# Patient Record
Sex: Female | Born: 1950 | Race: Black or African American | Hispanic: No | Marital: Married | Smoking: Former smoker
Health system: Southern US, Community
[De-identification: ages and names within clinical notes are randomized; demographics above are authoritative.]

## PROBLEM LIST (undated history)

## (undated) DIAGNOSIS — Z9109 Other allergy status, other than to drugs and biological substances: Secondary | ICD-10-CM

## (undated) DIAGNOSIS — G4730 Sleep apnea, unspecified: Secondary | ICD-10-CM

## (undated) DIAGNOSIS — M359 Systemic involvement of connective tissue, unspecified: Secondary | ICD-10-CM

## (undated) DIAGNOSIS — R0600 Dyspnea, unspecified: Secondary | ICD-10-CM

## (undated) DIAGNOSIS — I10 Essential (primary) hypertension: Secondary | ICD-10-CM

## (undated) DIAGNOSIS — E039 Hypothyroidism, unspecified: Secondary | ICD-10-CM

## (undated) DIAGNOSIS — E7800 Pure hypercholesterolemia, unspecified: Secondary | ICD-10-CM

## (undated) DIAGNOSIS — M069 Rheumatoid arthritis, unspecified: Secondary | ICD-10-CM

## (undated) DIAGNOSIS — C801 Malignant (primary) neoplasm, unspecified: Secondary | ICD-10-CM

## (undated) DIAGNOSIS — J45909 Unspecified asthma, uncomplicated: Secondary | ICD-10-CM

## (undated) DIAGNOSIS — E079 Disorder of thyroid, unspecified: Secondary | ICD-10-CM

## (undated) DIAGNOSIS — E119 Type 2 diabetes mellitus without complications: Secondary | ICD-10-CM

## (undated) DIAGNOSIS — G629 Polyneuropathy, unspecified: Secondary | ICD-10-CM

## (undated) DIAGNOSIS — M797 Fibromyalgia: Secondary | ICD-10-CM

## (undated) HISTORY — DX: Pure hypercholesterolemia, unspecified: E78.00

## (undated) HISTORY — PX: OTHER SURGICAL HISTORY: SHX169

## (undated) HISTORY — PX: DIAGNOSTIC LAPAROSCOPY: SUR761

## (undated) HISTORY — DX: Fibromyalgia: M79.7

## (undated) HISTORY — PX: CYSTOURETHROSCOPY: SHX476

## (undated) HISTORY — PX: ABDOMINAL HYSTERECTOMY: SHX81

## (undated) HISTORY — PX: CHOLECYSTECTOMY: SHX55

## (undated) HISTORY — PX: THYROIDECTOMY, PARTIAL: SHX18

## (undated) HISTORY — DX: Other allergy status, other than to drugs and biological substances: Z91.09

## (undated) HISTORY — DX: Disorder of thyroid, unspecified: E07.9

## (undated) HISTORY — PX: EYE SURGERY: SHX253

## (undated) HISTORY — PX: CATARACT EXTRACTION: SUR2

---

## 2016-03-31 ENCOUNTER — Emergency Department: Payer: Self-pay

## 2016-03-31 ENCOUNTER — Emergency Department
Admission: EM | Admit: 2016-03-31 | Discharge: 2016-04-01 | Disposition: A | Discharge status: Home / Self Care | Payer: Self-pay | Attending: Emergency Medicine | Admitting: Emergency Medicine

## 2016-03-31 ENCOUNTER — Encounter: Payer: Self-pay | Admitting: Emergency Medicine

## 2016-03-31 DIAGNOSIS — J4541 Moderate persistent asthma with (acute) exacerbation: Secondary | ICD-10-CM

## 2016-03-31 DIAGNOSIS — I10 Essential (primary) hypertension: Secondary | ICD-10-CM | POA: Insufficient documentation

## 2016-03-31 DIAGNOSIS — J4521 Mild intermittent asthma with (acute) exacerbation: Secondary | ICD-10-CM | POA: Insufficient documentation

## 2016-03-31 DIAGNOSIS — B3731 Acute candidiasis of vulva and vagina: Secondary | ICD-10-CM

## 2016-03-31 DIAGNOSIS — M069 Rheumatoid arthritis, unspecified: Secondary | ICD-10-CM | POA: Insufficient documentation

## 2016-03-31 DIAGNOSIS — B373 Candidiasis of vulva and vagina: Secondary | ICD-10-CM | POA: Insufficient documentation

## 2016-03-31 DIAGNOSIS — B9689 Other specified bacterial agents as the cause of diseases classified elsewhere: Secondary | ICD-10-CM

## 2016-03-31 DIAGNOSIS — Z79899 Other long term (current) drug therapy: Secondary | ICD-10-CM | POA: Insufficient documentation

## 2016-03-31 DIAGNOSIS — N760 Acute vaginitis: Secondary | ICD-10-CM | POA: Insufficient documentation

## 2016-03-31 HISTORY — DX: Rheumatoid arthritis, unspecified: M06.9

## 2016-03-31 HISTORY — DX: Unspecified asthma, uncomplicated: J45.909

## 2016-03-31 HISTORY — DX: Essential (primary) hypertension: I10

## 2016-03-31 MED ORDER — IPRATROPIUM-ALBUTEROL 0.5-2.5 (3) MG/3ML IN SOLN
RESPIRATORY_TRACT | Status: AC
  Administered 2016-03-31: 6 mL
  Filled 2016-03-31: qty 6
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
## 2016-03-31 NOTE — ED Notes (Signed)
Pt reports asthma exacerbation with congestion and wheezing.  Pt has multiple concerns (BV odor, rash and "full blown menopausal" and multiple stressors) but pt states if she can get some prednisone for asthma then she'd be okay.  Pt reports green and brown production with strong cough.  Reports nebulizer treatment approx 1 hour ago.

## 2016-04-01 LAB — WET PREP, GENITAL
Sperm: NONE SEEN
Trich, Wet Prep: NONE SEEN

## 2016-04-01 MED ORDER — PREDNISONE 10 MG PO TABS: ORAL_TABLET | ORAL | Status: DC

## 2016-04-01 MED ORDER — FLUCONAZOLE 150 MG PO TABS: 150.0000 mg | ORAL_TABLET | Freq: Once | ORAL | Status: DC

## 2016-04-01 MED ORDER — METRONIDAZOLE 500 MG PO TABS: 500.0000 mg | ORAL_TABLET | Freq: Two times a day (BID) | ORAL | Status: AC

## 2016-04-01 MED ORDER — AZITHROMYCIN 250 MG PO TABS: ORAL_TABLET | ORAL | Status: DC

## 2016-04-01 NOTE — Discharge Instructions (Signed)
Asthma, Adult °Asthma is a recurring condition in which the airways tighten and narrow. Asthma can make it difficult to breathe. It can cause coughing, wheezing, and shortness of breath. Asthma episodes, also called asthma attacks, range from minor to life-threatening. Asthma cannot be cured, but medicines and lifestyle changes can help control it. °CAUSES °Asthma is believed to be caused by inherited (genetic) and environmental factors, but its exact cause is unknown. Asthma may be triggered by allergens, lung infections, or irritants in the air. Asthma triggers are different for each person. Common triggers include:  °· Animal dander. °· Dust mites. °· Cockroaches. °· Pollen from trees or grass. °· Mold. °· Smoke. °· Air pollutants such as dust, household cleaners, hair sprays, aerosol sprays, paint fumes, strong chemicals, or strong odors. °· Cold air, weather changes, and winds (which increase molds and pollens in the air). °· Strong emotional expressions such as crying or laughing hard. °· Stress. °· Certain medicines (such as aspirin) or types of drugs (such as beta-blockers). °· Sulfites in foods and drinks. Foods and drinks that may contain sulfites include dried fruit, potato chips, and sparkling grape juice. °· Infections or inflammatory conditions such as the flu, a cold, or an inflammation of the nasal membranes (rhinitis). °· Gastroesophageal reflux disease (GERD). °· Exercise or strenuous activity. °SYMPTOMS °Symptoms may occur immediately after asthma is triggered or many hours later. Symptoms include: °· Wheezing. °· Excessive nighttime or early morning coughing. °· Frequent or severe coughing with a common cold. °· Chest tightness. °· Shortness of breath. °DIAGNOSIS  °The diagnosis of asthma is made by a review of your medical history and a physical exam. Tests may also be performed. These may include: °· Lung function studies. These tests show how much air you breathe in and out. °· Allergy  tests. °· Imaging tests such as X-rays. °TREATMENT  °Asthma cannot be cured, but it can usually be controlled. Treatment involves identifying and avoiding your asthma triggers. It also involves medicines. There are 2 classes of medicine used for asthma treatment:  °· Controller medicines. These prevent asthma symptoms from occurring. They are usually taken every day. °· Reliever or rescue medicines. These quickly relieve asthma symptoms. They are used as needed and provide short-term relief. °Your health care provider will help you create an asthma action plan. An asthma action plan is a written plan for managing and treating your asthma attacks. It includes a list of your asthma triggers and how they may be avoided. It also includes information on when medicines should be taken and when their dosage should be changed. An action plan may also involve the use of a device called a peak flow meter. A peak flow meter measures how well the lungs are working. It helps you monitor your condition. °HOME CARE INSTRUCTIONS  °· Take medicines only as directed by your health care provider. Speak with your health care provider if you have questions about how or when to take the medicines. °· Use a peak flow meter as directed by your health care provider. Record and keep track of readings. °· Understand and use the action plan to help minimize or stop an asthma attack without needing to seek medical care. °· Control your home environment in the following ways to help prevent asthma attacks: °· Do not smoke. Avoid being exposed to secondhand smoke. °· Change your heating and air conditioning filter regularly. °· Limit your use of fireplaces and wood stoves. °· Get rid of pests (such as roaches   and mice) and their droppings.  Throw away plants if you see mold on them.  Clean your floors and dust regularly. Use unscented cleaning products.  Try to have someone else vacuum for you regularly. Stay out of rooms while they are  being vacuumed and for a short while afterward. If you vacuum, use a dust mask from a hardware store, a double-layered or microfilter vacuum cleaner bag, or a vacuum cleaner with a HEPA filter.  Replace carpet with wood, tile, or vinyl flooring. Carpet can trap dander and dust.  Use allergy-proof pillows, mattress covers, and box spring covers.  Wash bed sheets and blankets every week in hot water and dry them in a dryer.  Use blankets that are made of polyester or cotton.  Clean bathrooms and kitchens with bleach. If possible, have someone repaint the walls in these rooms with mold-resistant paint. Keep out of the rooms that are being cleaned and painted.  Wash hands frequently. SEEK MEDICAL CARE IF:   You have wheezing, shortness of breath, or a cough even if taking medicine to prevent attacks.  The colored mucus you cough up (sputum) is thicker than usual.  Your sputum changes from clear or white to yellow, green, gray, or bloody.  You have any problems that may be related to the medicines you are taking (such as a rash, itching, swelling, or trouble breathing).  You are using a reliever medicine more than 2-3 times per week.  Your peak flow is still at 50-79% of your personal best after following your action plan for 1 hour.  You have a fever. SEEK IMMEDIATE MEDICAL CARE IF:   You seem to be getting worse and are unresponsive to treatment during an asthma attack.  You are short of breath even at rest.  You get short of breath when doing very little physical activity.  You have difficulty eating, drinking, or talking due to asthma symptoms.  You develop chest pain.  You develop a fast heartbeat.  You have a bluish color to your lips or fingernails.  You are light-headed, dizzy, or faint.  Your peak flow is less than 50% of your personal best.   This information is not intended to replace advice given to you by your health care provider. Make sure you discuss any  questions you have with your health care provider.   Document Released: 12/15/2005 Document Revised: 09/05/2015 Document Reviewed: 07/14/2013 Elsevier Interactive Patient Education 2016 Else[employer] Inc[employer]  Bacterial Vaginosis Bacterial vaginosis is an infection of the vagina. It happens when too many germs (bacteria) grow in the vagina. Having this infection puts you at risk for getting other infections from sex. Treating this infection can help lower your risk for other infections, such as:   Chlamydia.  Gonorrhea.  HIV.  Herpes. HOME CARE  Take your medicine as told by your doctor.  Finish your medicine even if you start to feel better.  Tell your sex partner that you have an infection. They should see their doctor for treatment.  During treatment:  Avoid sex or use condoms correctly.  Do not douche.  Do not drink alcohol unless your doctor tells you it is ok.  Do not breastfeed unless your doctor tells you it is ok. GET HELP IF:  You are not getting better after 3 days of treatment.  You have more grey fluid (discharge) coming from your vagina than before.  You have more pain than before.  You have a fever. MAKE SURE YOU:  Understand these instructions.  Will watch your condition.  Will get help right away if you are not doing well or get worse.   This information is not intended to replace advice given to you by your health care provider. Make sure you discuss any questions you have with your health care provider.   Document Released: 09/23/2008 Document Revised: 01/05/2015 Document Reviewed: 07/27/2013 Elsevier Interactive Patient Education 2016 Elsevier Inc.  Probiotics WHAT ARE PROBIOTICS? Probiotics are the good bacteria and yeasts that live in your body and keep you and your digestive system healthy. Probiotics also help your body's defense (immune) system and protect your body against bad bacterial growth.  Certain foods contain probiotics, such as  yogurt. Probiotics can also be purchased as a supplement. As with any supplement or drug, it is important to discuss its use with your health care provider.  WHAT AFFECTS THE BALANCE OF BACTERIA IN MY BODY? The balance of bacteria in your body can be affected by:   Antibiotic medicines. Antibiotics are sometimes necessary to treat infection. Unfortunately, they may kill good or friendly bacteria in your body as well as the bad bacteria. This may lead to stomach problems like diarrhea, gas, and cramping.  Disease. Some conditions are the result of an overgrowth of bad bacteria, yeasts, parasites, or fungi. These conditions include:   Infectious diarrhea.  Stomach and respiratory infections.  Skin infections.  Irritable bowel syndrome (IBS).  Inflammatory bowel diseases.  Ulcer due to Helicobacter pylori (H. pylori) infection.  Tooth decay and periodontal disease.  Vaginal infections. Stress and poor diet may also lower the good bacteria in your body.  WHAT TYPE OF PROBIOTIC IS RIGHT FOR ME? Probiotics are available over the counter at your local pharmacy, health food, or grocery store. They come in many different forms, combinations of strains, and dosing strengths. Some may need to be refrigerated. Always read the label for storage and usage instructions. Specific strains have been shown to be more effective for certain conditions. Ask your health care provider what option is best for you.  WHY WOULD I NEED PROBIOTICS? There are many reasons your health care provider might recommend a probiotic supplement, including:   Diarrhea.  Constipation.  IBS.  Respiratory infections.  Yeast infections.  Acne, eczema, and other skin conditions.  Frequent urinary tract infections (UTIs). ARE THERE SIDE EFFECTS OF PROBIOTICS? Some people experience mild side effects when taking probiotics. Side effects are usually temporary and may include:   Gas.  Bloating.  Cramping. Rarely,  serious side effects, such as infection or immune system changes, may occur. WHAT ELSE DO I NEED TO KNOW ABOUT PROBIOTICS?   There are many different strains of probiotics. Certain strains may be more effective depending on your condition. Probiotics are available in varying doses. Ask your health care provider which probiotic you should use and how often.   If you are taking probiotics along with antibiotics, it is generally recommended to wait at least 2 hours between taking the antibiotic and taking the probiotic.  FOR MORE INFORMATION:  [facility] for Complementary and Alternative Medicine [email or web address]   This information is not intended to replace advice given to you by your health care provider. Make sure you discuss any questions you have with your health care provider.   Document Released: 07/12/2014 Document Reviewed: 07/12/2014 Elsevier Interactive Patient Education 2016 Elsevier Inc.  Monilial Vaginitis Vaginitis in a soreness, swelling and redness (inflammation) of the vagina and vulva. Monilial vaginitis is not a sexually  transmitted infection. CAUSES  Yeast vaginitis is caused by yeast (candida) that is normally found in your vagina. With a yeast infection, the candida has overgrown in number to a point that upsets the chemical balance. SYMPTOMS   White, thick vaginal discharge.  Swelling, itching, redness and irritation of the vagina and possibly the lips of the vagina (vulva).  Burning or painful urination.  Painful intercourse. DIAGNOSIS  Things that may contribute to monilial vaginitis are:  Postmenopausal and virginal states.  Pregnancy.  Infections.  Being tired, sick or stressed, especially if you had monilial vaginitis in the past.  Diabetes. Good control will help lower the chance.  Birth control pills.  Tight fitting garments.  Using bubble bath, feminine sprays, douches or deodorant tampons.  Taking certain medications that kill  germs (antibiotics).  Sporadic recurrence can occur if you become ill. TREATMENT  Your caregiver will give you medication.  There are several kinds of anti monilial vaginal creams and suppositories specific for monilial vaginitis. For recurrent yeast infections, use a suppository or cream in the vagina 2 times a week, or as directed.  Anti-monilial or steroid cream for the itching or irritation of the vulva may also be used. Get your caregiver's permission.  Painting the vagina with methylene blue solution may help if the monilial cream does not work.  Eating yogurt may help prevent monilial vaginitis. HOME CARE INSTRUCTIONS   Finish all medication as prescribed.  Do not have sex until treatment is completed or after your caregiver tells you it is okay.  Take warm sitz baths.  Do not douche.  Do not use tampons, especially scented ones.  Wear cotton underwear.  Avoid tight pants and panty hose.  Tell your sexual partner that you have a yeast infection. They should go to their caregiver if they have symptoms such as mild rash or itching.  Your sexual partner should be treated as well if your infection is difficult to eliminate.  Practice safer sex. Use condoms.  Some vaginal medications cause latex condoms to fail. Vaginal medications that harm condoms are:  Cleocin cream.  Butoconazole (Femstat).  Terconazole (Terazol) vaginal suppository.  Miconazole (Monistat) (may be purchased over the counter). SEEK MEDICAL CARE IF:   You have a temperature by mouth above 102 F (38.9 C).  The infection is getting worse after 2 days of treatment.  The infection is not getting better after 3 days of treatment.  You develop blisters in or around your vagina.  You develop vaginal bleeding, and it is not your menstrual period.  You have pain when you urinate.  You develop intestinal problems.  You have pain with sexual intercourse.   This information is not intended  to replace advice given to you by your health care provider. Make sure you discuss any questions you have with your health care provider.   Document Released: 09/24/2005 Document Revised: 03/08/2012 Document Reviewed: 06/18/2015 Elsevier Interactive Patient Education 2016 [place] the prescription meds as directed. Continue your home meds as prescribed. Follow-up with your provider for ongoing management and treatment. Return to the ED as needed for worsening symptoms.

## 2016-08-22 ENCOUNTER — Emergency Department
Admission: EM | Admit: 2016-08-22 | Discharge: 2016-08-22 | Disposition: A | Discharge status: Home / Self Care | Payer: Non-veteran care | Attending: Emergency Medicine | Admitting: Emergency Medicine

## 2016-08-22 ENCOUNTER — Emergency Department: Payer: Non-veteran care

## 2016-08-22 ENCOUNTER — Inpatient Hospital Stay
Admission: EM | Admit: 2016-08-22 | Discharge: 2016-08-23 | DRG: 202 | Disposition: A | Discharge status: Home / Self Care | Payer: Non-veteran care | Attending: Internal Medicine | Admitting: Internal Medicine

## 2016-08-22 ENCOUNTER — Encounter: Payer: Self-pay | Admitting: Internal Medicine

## 2016-08-22 ENCOUNTER — Encounter: Payer: Self-pay | Admitting: Emergency Medicine

## 2016-08-22 DIAGNOSIS — J45901 Unspecified asthma with (acute) exacerbation: Secondary | ICD-10-CM | POA: Insufficient documentation

## 2016-08-22 DIAGNOSIS — R0602 Shortness of breath: Secondary | ICD-10-CM | POA: Diagnosis not present

## 2016-08-22 DIAGNOSIS — Z9849 Cataract extraction status, unspecified eye: Secondary | ICD-10-CM

## 2016-08-22 DIAGNOSIS — Z833 Family history of diabetes mellitus: Secondary | ICD-10-CM | POA: Diagnosis not present

## 2016-08-22 DIAGNOSIS — Z880 Allergy status to penicillin: Secondary | ICD-10-CM | POA: Diagnosis not present

## 2016-08-22 DIAGNOSIS — M069 Rheumatoid arthritis, unspecified: Secondary | ICD-10-CM | POA: Diagnosis present

## 2016-08-22 DIAGNOSIS — Z9049 Acquired absence of other specified parts of digestive tract: Secondary | ICD-10-CM | POA: Diagnosis not present

## 2016-08-22 DIAGNOSIS — Z8249 Family history of ischemic heart disease and other diseases of the circulatory system: Secondary | ICD-10-CM | POA: Diagnosis not present

## 2016-08-22 DIAGNOSIS — Z886 Allergy status to analgesic agent status: Secondary | ICD-10-CM | POA: Diagnosis not present

## 2016-08-22 DIAGNOSIS — Z79899 Other long term (current) drug therapy: Secondary | ICD-10-CM | POA: Diagnosis not present

## 2016-08-22 DIAGNOSIS — E119 Type 2 diabetes mellitus without complications: Secondary | ICD-10-CM | POA: Diagnosis present

## 2016-08-22 DIAGNOSIS — J209 Acute bronchitis, unspecified: Principal | ICD-10-CM | POA: Diagnosis present

## 2016-08-22 DIAGNOSIS — I10 Essential (primary) hypertension: Secondary | ICD-10-CM | POA: Diagnosis present

## 2016-08-22 DIAGNOSIS — Z7952 Long term (current) use of systemic steroids: Secondary | ICD-10-CM | POA: Diagnosis not present

## 2016-08-22 HISTORY — DX: Unspecified asthma with (acute) exacerbation: J45.901

## 2016-08-22 LAB — CBC WITH DIFFERENTIAL/PLATELET
Basophils Absolute: 0 10*3/uL (ref 0–0.1)
Basophils Relative: 1 %
Eosinophils Absolute: 1.1 10*3/uL — ABNORMAL HIGH (ref 0–0.7)
Eosinophils Relative: 12 %
HCT: 43.6 % (ref 35.0–47.0)
Hemoglobin: 14.4 g/dL (ref 12.0–16.0)
Lymphocytes Relative: 44 %
Lymphs Abs: 4.1 10*3/uL — ABNORMAL HIGH (ref 1.0–3.6)
MCH: 26.9 pg (ref 26.0–34.0)
MCHC: 32.9 g/dL (ref 32.0–36.0)
MCV: 81.9 fL (ref 80.0–100.0)
Monocytes Absolute: 1 10*3/uL — ABNORMAL HIGH (ref 0.2–0.9)
Monocytes Relative: 11 %
Neutro Abs: 3 10*3/uL (ref 1.4–6.5)
Neutrophils Relative %: 32 %
Platelets: 203 10*3/uL (ref 150–440)
RBC: 5.33 MIL/uL — ABNORMAL HIGH (ref 3.80–5.20)
RDW: 14.2 % (ref 11.5–14.5)
WBC: 9.2 10*3/uL (ref 3.6–11.0)

## 2016-08-22 LAB — BASIC METABOLIC PANEL
Anion gap: 12 (ref 5–15)
BUN: 13 mg/dL (ref 6–20)
CO2: 24 mmol/L (ref 22–32)
Calcium: 9.7 mg/dL (ref 8.9–10.3)
Chloride: 107 mmol/L (ref 101–111)
Creatinine, Ser: 0.85 mg/dL (ref 0.44–1.00)
GFR calc Af Amer: >60 mL/min (ref >60)
GFR calc non Af Amer: >60 mL/min (ref >60)
Glucose, Bld: 128 mg/dL — ABNORMAL HIGH (ref 65–99)
Potassium: 3.5 mmol/L (ref 3.5–5.1)
Sodium: 143 mmol/L (ref 135–145)

## 2016-08-22 LAB — TROPONIN I: Troponin I: <0.03 ng/mL (ref <0.03)

## 2016-08-22 MED ORDER — METHYLPREDNISOLONE SODIUM SUCC 125 MG IJ SOLR
60.0000 mg | Freq: Four times a day (QID) | INTRAMUSCULAR | Status: DC
  Administered 2016-08-22 – 2016-08-23 (×4): 60 mg via INTRAVENOUS
  Filled 2016-08-22 (×4): qty 2

## 2016-08-22 MED ORDER — IPRATROPIUM-ALBUTEROL 0.5-2.5 (3) MG/3ML IN SOLN
3.0000 mL | Freq: Once | RESPIRATORY_TRACT | Status: AC
  Administered 2016-08-22: 3 mL via RESPIRATORY_TRACT
  Filled 2016-08-22: qty 3

## 2016-08-22 MED ORDER — ALBUTEROL SULFATE (2.5 MG/3ML) 0.083% IN NEBU
5.0000 mg | INHALATION_SOLUTION | Freq: Once | RESPIRATORY_TRACT | Status: AC
  Administered 2016-08-22: 5 mg via RESPIRATORY_TRACT

## 2016-08-22 MED ORDER — METHYLPREDNISOLONE SODIUM SUCC 125 MG IJ SOLR
INTRAMUSCULAR | Status: AC
  Administered 2016-08-22: 125 mg via INTRAVENOUS
  Filled 2016-08-22: qty 2

## 2016-08-22 MED ORDER — ALBUTEROL SULFATE (2.5 MG/3ML) 0.083% IN NEBU
INHALATION_SOLUTION | RESPIRATORY_TRACT | Status: AC
  Administered 2016-08-22: 2.5 mg via RESPIRATORY_TRACT
  Filled 2016-08-22: qty 3

## 2016-08-22 MED ORDER — ALBUTEROL SULFATE (2.5 MG/3ML) 0.083% IN NEBU
2.5000 mg | INHALATION_SOLUTION | Freq: Once | RESPIRATORY_TRACT | Status: AC
  Administered 2016-08-22: 2.5 mg via RESPIRATORY_TRACT

## 2016-08-22 MED ORDER — ONDANSETRON HCL 4 MG/2ML IJ SOLN: 4.0000 mg | Freq: Four times a day (QID) | INTRAMUSCULAR | Status: DC | PRN

## 2016-08-22 MED ORDER — GUAIFENESIN-DM 100-10 MG/5ML PO SYRP
5.0000 mL | ORAL_SOLUTION | ORAL | Status: DC | PRN
  Administered 2016-08-22 – 2016-08-23 (×3): 5 mL via ORAL
  Filled 2016-08-22 (×3): qty 5

## 2016-08-22 MED ORDER — AZITHROMYCIN 500 MG PO TABS
500.0000 mg | ORAL_TABLET | Freq: Every day | ORAL | Status: DC
  Administered 2016-08-23: 10:00:00 500 mg via ORAL
  Filled 2016-08-22: qty 1

## 2016-08-22 MED ORDER — ALBUTEROL SULFATE (2.5 MG/3ML) 0.083% IN NEBU
INHALATION_SOLUTION | RESPIRATORY_TRACT | Status: AC
  Administered 2016-08-22: 5 mg via RESPIRATORY_TRACT
  Filled 2016-08-22: qty 6

## 2016-08-22 MED ORDER — ALBUTEROL SULFATE (2.5 MG/3ML) 0.083% IN NEBU
2.5000 mg | INHALATION_SOLUTION | Freq: Four times a day (QID) | RESPIRATORY_TRACT | Status: DC | PRN
  Administered 2016-08-23 (×2): 2.5 mg via RESPIRATORY_TRACT
  Filled 2016-08-22 (×2): qty 3

## 2016-08-22 MED ORDER — IPRATROPIUM BROMIDE 0.02 % IN SOLN
0.5000 mg | Freq: Four times a day (QID) | RESPIRATORY_TRACT | Status: DC
  Administered 2016-08-22: 0.5 mg via RESPIRATORY_TRACT
  Filled 2016-08-22: qty 2.5

## 2016-08-22 MED ORDER — LORATADINE 10 MG PO TABS
10.0000 mg | ORAL_TABLET | Freq: Every day | ORAL | Status: DC
  Filled 2016-08-22: qty 1

## 2016-08-22 MED ORDER — ALBUTEROL SULFATE HFA 108 (90 BASE) MCG/ACT IN AERS: 2.0000 | INHALATION_SPRAY | Freq: Four times a day (QID) | RESPIRATORY_TRACT | 0 refills | Status: DC | PRN

## 2016-08-22 MED ORDER — ENOXAPARIN SODIUM 40 MG/0.4ML ~~LOC~~ SOLN: 40.0000 mg | SUBCUTANEOUS | Status: DC

## 2016-08-22 MED ORDER — ACETAMINOPHEN 650 MG RE SUPP
650.0000 mg | Freq: Four times a day (QID) | RECTAL | Status: DC | PRN
Start: 2016-08-22 — End: 2016-08-23

## 2016-08-22 MED ORDER — ONDANSETRON HCL 4 MG PO TABS: 4.0000 mg | ORAL_TABLET | Freq: Four times a day (QID) | ORAL | Status: DC | PRN

## 2016-08-22 MED ORDER — MONTELUKAST SODIUM 10 MG PO TABS
10.0000 mg | ORAL_TABLET | Freq: Every day | ORAL | Status: DC
  Administered 2016-08-22: 10 mg via ORAL
  Filled 2016-08-22: qty 1

## 2016-08-22 MED ORDER — ACETAMINOPHEN 325 MG PO TABS
650.0000 mg | ORAL_TABLET | Freq: Four times a day (QID) | ORAL | Status: DC | PRN
  Administered 2016-08-23 (×2): 650 mg via ORAL
  Filled 2016-08-22 (×2): qty 2

## 2016-08-22 MED ORDER — AZITHROMYCIN 500 MG PO TABS
500.0000 mg | ORAL_TABLET | Freq: Once | ORAL | Status: AC
Start: 2016-08-22 — End: 2016-08-22
  Administered 2016-08-22: 500 mg via ORAL
  Filled 2016-08-22: qty 1

## 2016-08-22 MED ORDER — ALBUTEROL SULFATE 1.25 MG/3ML IN NEBU: 1.0000 | INHALATION_SOLUTION | Freq: Four times a day (QID) | RESPIRATORY_TRACT | Status: DC | PRN

## 2016-08-22 MED ORDER — SODIUM CHLORIDE 0.9% FLUSH
3.0000 mL | Freq: Two times a day (BID) | INTRAVENOUS | Status: DC
  Administered 2016-08-22 – 2016-08-23 (×2): 3 mL via INTRAVENOUS

## 2016-08-22 MED ORDER — MOMETASONE FURO-FORMOTEROL FUM 200-5 MCG/ACT IN AERO
2.0000 | INHALATION_SPRAY | Freq: Two times a day (BID) | RESPIRATORY_TRACT | Status: DC
  Filled 2016-08-22: qty 8.8

## 2016-08-22 MED ORDER — METHYLPREDNISOLONE SODIUM SUCC 125 MG IJ SOLR
125.0000 mg | Freq: Once | INTRAMUSCULAR | Status: AC
  Administered 2016-08-22: 125 mg via INTRAVENOUS

## 2016-08-22 NOTE — Discharge Instructions (Signed)
Please seek medical attention for any high fevers, chest pain, shortness of breath, change in behavior, persistent vomiting, bloody stool or any other new or concerning symptoms.  

## 2016-08-22 NOTE — ED Notes (Signed)
Spoke with Dr Edd Fabian who states do not repeat blood or xray at this time.

## 2016-08-22 NOTE — ED Notes (Signed)
MD at bedside. 

## 2016-08-22 NOTE — ED Provider Notes (Addendum)
[facility] Emergency Department Provider Note  Time seen: 8:17 PM  I have reviewed the triage vital signs and the nursing notes.   HISTORY  Chief Complaint Shortness of Breath    HPI Sandra Fischer is a 65 y.o. female with a past medical history of asthma (has been told this is COPD in the past) who presents to the emergency department with difficulty breathing. According to the patient and record review the patient was seen earlier today in the emergency department with significant difficulty breathing, no improvement after nebulizer treatments and was placed on BiPAP. Admission to the hospital is recommended at that time, but the patient states she had to go home to care for her mother. She left the hospital, however came back this evening due to increased shortness of breath. Upon arrival the patient states moderate difficulty breathing, has moderate expiratory wheeze, satting 93% on room air. Patient denies any chest pain. Denies any fever. Patient was placed on antibiotics 2 days ago by her PCP.  Past Medical History:  Diagnosis Date  . Asthma   . Hypertension   . RA (rheumatoid arthritis) (HCC)     There are no active problems to display for this patient.   Past Surgical History:  Procedure Laterality Date  . CATARACT EXTRACTION    . CHOLECYSTECTOMY      Prior to Admission medications   Medication Sig Start Date End Date Taking? Authorizing Provider  albuterol (ACCUNEB) 1.25 MG/3ML nebulizer solution Take 1 ampule by nebulization every 6 (six) hours as needed for wheezing.    Historical Provider, MD  albuterol (PROVENTIL HFA;VENTOLIN HFA) 108 (90 Base) MCG/ACT inhaler Inhale into the lungs every 6 (six) hours as needed for wheezing or shortness of breath.    Historical Provider, MD  albuterol (PROVENTIL HFA;VENTOLIN HFA) 108 (90 Base) MCG/ACT inhaler Inhale 2 puffs into the lungs every 6 (six) hours as needed for wheezing or shortness of breath.  08/22/16   Nance Pear, MD  azithromycin (ZITHROMAX Z-PAK) 250 MG tablet Take 2 tablets (500 mg) on  Day 1,  followed by 1 tablet (250 mg) once daily on Days 2 through 5. 04/01/16   Jenise V Bacon Menshew, PA-C  budesonide-formoterol (SYMBICORT) 160-4.5 MCG/ACT inhaler Inhale 2 puffs into the lungs 2 (two) times daily.    Historical Provider, MD  desloratadine (CLARINEX) 5 MG tablet Take 5 mg by mouth daily.    Historical Provider, MD  fluconazole (DIFLUCAN) 150 MG tablet Take 1 tablet (150 mg total) by mouth once. 04/01/16   Jenise V Bacon Menshew, PA-C  ipratropium (ATROVENT) 0.02 % nebulizer solution Take 0.5 mg by nebulization 4 (four) times daily.    Historical Provider, MD  montelukast (SINGULAIR) 10 MG tablet Take 10 mg by mouth at bedtime.    Historical Provider, MD  predniSONE (DELTASONE) 10 MG tablet Take 6 tabs daily x 3 days Take 5 tabs daily x 3 days Take 4 tabs daily x 3 days Take 3 tabs daily x 3 days Take 2 tabs daily x 3 days Take 1 tab daily x 3 days 04/01/16   Dannielle Karvonen Menshew, PA-C    Allergies  Allergen Reactions  . Codeine Hives and Nausea And Vomiting  . Penicillins Nausea And Vomiting    No family history on file.  Social History Social History  Substance Use Topics  . Smoking status: Never Smoker  . Smokeless tobacco: Never Used  . Alcohol use No    Review of Systems  Constitutional: Negative for fever. Cardiovascular: Negative for chest pain. Respiratory: Positive for shortness of breath. Gastrointestinal: Negative for abdominal pain Musculoskeletal: Negative for back pain. Neurological: Negative for headache 10-point ROS otherwise negative.  ____________________________________________   PHYSICAL EXAM:  VITAL SIGNS: ED Triage Vitals  Enc Vitals Group     BP 08/22/16 1956 (!) 180/91     Pulse Rate 08/22/16 1956 (!) 109     Resp 08/22/16 1956 (!) 24     Temp 08/22/16 1956 98.1 F (36.7 C)     Temp Source 08/22/16 1956 Oral     SpO2  08/22/16 1956 95 %     Weight 08/22/16 1956 205 lb (93 kg)     Height 08/22/16 1956 5\' 2"  (1.575 m)     Head Circumference --      Peak Flow --      Pain Score 08/22/16 1957 3     Pain Loc --      Pain Edu? --      Excl. in [place]? --     Constitutional: Alert and oriented. Well appearing and in no distress. Eyes: Normal exam ENT   Head: Normocephalic and atraumatic.   Mouth/Throat: Mucous membranes are moist. Cardiovascular: Normal rate, regular rhythm. No murmur Respiratory: Mild tachypnea around 25 breaths per minute, moderate expiratory wheeze bilaterally. No rales or rhonchi. Gastrointestinal: Soft and nontender. No distention.   Musculoskeletal: Nontender with normal range of motion in all extremities.  Neurologic:  Normal speech and language. No gross focal neurologic deficits Skin:  Skin is warm, dry and intact.  Psychiatric: Mood and affect are normal. Speech and behavior are normal.   ____________________________________________   INITIAL IMPRESSION / ASSESSMENT AND PLAN / ED COURSE  Pertinent labs & imaging results that were available during my care of the patient were reviewed by me and considered in my medical decision making (see chart for details).  Patient with mild respiratory distress due to shortness of breath sitting upright in the bed with moderate expiratory wheeze bilaterally. We'll dose DuoNeb's in the emergency department. Patient was seen earlier today for the same, placed on BiPAP in the emergency department for difficulty breathing. Patient's labs are largely normal, chest x-ray is negative, EKG was reassuring. I anticipate likely admission to the hospital. The patient has [employer] as well as Medicaid. Patient states she would prefer to be admitted here over the [place]. Patient has received Solu-Medrol earlier today.   ----------------------------------------- 9:52 PM on 08/22/2016 -----------------------------------------  Patient continues to  have significant wheeze with significant cough. We'll cover with Zithromax, and admitted to the hospital for likely asthma exacerbation. Patient agreeable to plan. ____________________________________________   FINAL CLINICAL IMPRESSION(S) / ED DIAGNOSES  Difficulty breathing Asthma exacerbation    Harvest Dark, MD 08/22/16 [id number]    Harvest Dark, MD 08/22/16 2152

## 2016-08-22 NOTE — ED Provider Notes (Signed)
[facility] Emergency Department Provider Note     I have reviewed the triage vital signs and the nursing notes.   HISTORY  Chief Complaint Shortness of Breath   History limited by: Not Limited   HPI Sandra Fischer is a 65 y.o. female who presents to the emergency department today because of concerns for shortness of breath. Patient does have a history of asthma. The patient has been having some issues with her shortness of breath for over a week. She has been on antibiotics. The past 2 days and has been worse. She did get some troubling news yesterday which he thinks might have exacerbated it. She has had some cough. She denies any fevers.    Past Medical History:  Diagnosis Date  . Asthma   . Hypertension   . RA (rheumatoid arthritis) (HCC)     There are no active problems to display for this patient.   Past Surgical History:  Procedure Laterality Date  . CATARACT EXTRACTION    . CHOLECYSTECTOMY      Prior to Admission medications   Medication Sig Start Date End Date Taking? Authorizing Provider  albuterol (ACCUNEB) 1.25 MG/3ML nebulizer solution Take 1 ampule by nebulization every 6 (six) hours as needed for wheezing.    Historical Provider, MD  albuterol (PROVENTIL HFA;VENTOLIN HFA) 108 (90 Base) MCG/ACT inhaler Inhale into the lungs every 6 (six) hours as needed for wheezing or shortness of breath.    Historical Provider, MD  azithromycin (ZITHROMAX Z-PAK) 250 MG tablet Take 2 tablets (500 mg) on  Day 1,  followed by 1 tablet (250 mg) once daily on Days 2 through 5. 04/01/16   Jenise V Bacon Menshew, PA-C  budesonide-formoterol (SYMBICORT) 160-4.5 MCG/ACT inhaler Inhale 2 puffs into the lungs 2 (two) times daily.    Historical Provider, MD  desloratadine (CLARINEX) 5 MG tablet Take 5 mg by mouth daily.    Historical Provider, MD  fluconazole (DIFLUCAN) 150 MG tablet Take 1 tablet (150 mg total) by mouth once. 04/01/16   Jenise V Bacon Menshew, PA-C   ipratropium (ATROVENT) 0.02 % nebulizer solution Take 0.5 mg by nebulization 4 (four) times daily.    Historical Provider, MD  montelukast (SINGULAIR) 10 MG tablet Take 10 mg by mouth at bedtime.    Historical Provider, MD  predniSONE (DELTASONE) 10 MG tablet Take 6 tabs daily x 3 days Take 5 tabs daily x 3 days Take 4 tabs daily x 3 days Take 3 tabs daily x 3 days Take 2 tabs daily x 3 days Take 1 tab daily x 3 days 04/01/16   Dannielle Karvonen Menshew, PA-C    Allergies Codeine and Penicillins  History reviewed. No pertinent family history.  Social History Social History  Substance Use Topics  . Smoking status: Never Smoker  . Smokeless tobacco: Never Used  . Alcohol use No    Review of Systems  Constitutional: Negative for fever. Cardiovascular: Negative for chest pain. Respiratory: positive for shortness of breath. Gastrointestinal: Negative for abdominal pain, vomiting and diarrhea. Genitourinary: Negative for dysuria. Musculoskeletal: Negative for back pain. Skin: Negative for rash. Neurological: Negative for headaches, focal weakness or numbness.  10-point ROS otherwise negative.  ____________________________________________   PHYSICAL EXAM:  VITAL SIGNS: ED Triage Vitals  Enc Vitals Group     BP 08/22/16 1200 (!) 151/96     Pulse Rate 08/22/16 1139 95     Resp 08/22/16 1200 (!) 31     Temp --  Temp src --      SpO2 08/22/16 1139 96 %     Weight 08/22/16 1139 205 lb (93 kg)   Constitutional: Alert and oriented. Mild respiratory distress Eyes: Conjunctivae are normal. PERRL. Normal extraocular movements. ENT   Head: Normocephalic and atraumatic.   Nose: No congestion/rhinnorhea.   Mouth/Throat: Mucous membranes are moist.   Neck: No stridor. Hematological/Lymphatic/Immunilogical: No cervical lymphadenopathy. Cardiovascular: Normal rate, regular rhythm.  No murmurs, rubs, or gallops. Respiratory: increased respiratory effort. Tachypnea.  Diffuse bilateral expiratory wheezing. Gastrointestinal: Soft and nontender. No distention. There is no CVA tenderness. Genitourinary: Deferred Musculoskeletal: Normal range of motion in all extremities. No joint effusions.  No lower extremity tenderness nor edema. Neurologic:  Normal speech and language. No gross focal neurologic deficits are appreciated.  Skin:  Skin is warm, dry and intact. No rash noted. Psychiatric: Mood and affect are normal. Speech and behavior are normal. Patient exhibits appropriate insight and judgment.  ____________________________________________    LABS (pertinent positives/negatives)  Labs Reviewed  CBC WITH DIFFERENTIAL/PLATELET - Abnormal; Notable for the following:       Result Value   RBC 5.33 (*)    Lymphs Abs 4.1 (*)    Monocytes Absolute 1.0 (*)    Eosinophils Absolute 1.1 (*)    All other components within normal limits  BASIC METABOLIC PANEL - Abnormal; Notable for the following:    Glucose, Bld 128 (*)    All other components within normal limits  TROPONIN I     ____________________________________________   EKG  I, Nance Pear, attending physician, personally viewed and interpreted this EKG  EKG Time: 1147 Rate: 88 Rhythm: normal sinus rhythm Axis: normal Intervals: qtc 615 QRS: narrow, q waves V1, V2 ST changes: no st elevation Impression: abnormal ekg   ____________________________________________    RADIOLOGY  CXR IMPRESSION:  No active disease.       ____________________________________________   PROCEDURES  Procedures  ____________________________________________   INITIAL IMPRESSION / ASSESSMENT AND PLAN / ED COURSE  Pertinent labs & imaging results that were available during my care of the patient were reviewed by me and considered in my medical decision making (see chart for details).  Patient presents to the emergency department with difficulty with breathing. Patient does have a history of  asthma and exam is concerning for asthma exacerbation. Patient was given multiple DuoNeb's continued to have breathing difficulties and was placed on BiPAP. Patient was given Solu-Medrol.  Clinical Course   Chest x-ray without any signs of pneumonia. The patient is now off BiPAP and appears much better. I did recommend admission however the patient states she has to take care of things at home. She states her primary care doctor's order to her penicillin already. She does state that she will return to the emergency department later today. I do think that the patient would best be benefited from an overnight admission. I did strongly urge the patient to return to the emergency department either later this evening or at the first sign of any worsening breathing or difficulties. ____________________________________________   FINAL CLINICAL IMPRESSION(S) / ED DIAGNOSES  Final diagnoses:  Asthma exacerbation     Note: This dictation was prepared with Dragon dictation. Any transcriptional errors that result from this process are unintentional    Nance Pear, MD 08/22/16 [phone]

## 2016-08-22 NOTE — ED Notes (Signed)
RT at bedside.

## 2016-08-22 NOTE — ED Triage Notes (Signed)
Pt states she has hx of asthma and is on antibiotics currently and found out that her antibiotic and inhaler symbicort.  Pt has not used her albuterol inhaler because she is out and is waiting for them to be delivered.

## 2016-08-22 NOTE — H&P (Signed)
[facility] NAME: Sandra Fischer    MR#:  [id number]  DATE OF BIRTH:  December 29, 1951  DATE OF ADMISSION:  08/22/2016  PRIMARY CARE PHYSICIAN: Pcp Not In System   REQUESTING/REFERRING PHYSICIAN: Kerman Passey, MD  CHIEF COMPLAINT:   Chief Complaint  Patient presents with  . Shortness of Breath    HISTORY OF PRESENT ILLNESS:  Sandra Fischer  is a 65 y.o. female who presents with Several days progressive shortness of breath with wheezing. Patient has a known history of asthma and has had frequent exacerbations throughout the last year. Dates that over the last month she's had recurring symptoms that a been difficult to control despite appropriate treatment. She's had a couple of courses of Levaquin along with by mouth steroids outpatient, but her symptoms have recurred despite this. She was in the ED earlier today, and given her O2 sats required BiPAP for some time. Afterwards she came off the BiPAP and had to leave in order to arrange care for her mother. She came back afterwards, still short of breath with significant wheezing, but not requiring BiPAP at this time. She did have a full dose of Solu-Medrol earlier today. Hospitalists were called for admission.  PAST MEDICAL HISTORY:   Past Medical History:  Diagnosis Date  . Asthma   . Hypertension   . RA (rheumatoid arthritis) ([place])     PAST SURGICAL HISTORY:   Past Surgical History:  Procedure Laterality Date  . CATARACT EXTRACTION    . CHOLECYSTECTOMY      SOCIAL HISTORY:   Social History  Substance Use Topics  . Smoking status: Never Smoker  . Smokeless tobacco: Never Used  . Alcohol use No    FAMILY HISTORY:   Family History  Problem Relation Age of Onset  . Diabetes Mother   . Hypertension Mother   . Hyperlipidemia Mother     DRUG ALLERGIES:   Allergies  Allergen Reactions  . Codeine Hives and Nausea And Vomiting  . Penicillins Nausea And Vomiting     MEDICATIONS AT HOME:   Prior to Admission medications   Medication Sig Start Date End Date Taking? Authorizing Provider  albuterol (ACCUNEB) 1.25 MG/3ML nebulizer solution Take 1 ampule by nebulization every 6 (six) hours as needed for wheezing.    Historical Provider, MD  albuterol (PROVENTIL HFA;VENTOLIN HFA) 108 (90 Base) MCG/ACT inhaler Inhale into the lungs every 6 (six) hours as needed for wheezing or shortness of breath.    Historical Provider, MD  albuterol (PROVENTIL HFA;VENTOLIN HFA) 108 (90 Base) MCG/ACT inhaler Inhale 2 puffs into the lungs every 6 (six) hours as needed for wheezing or shortness of breath. 08/22/16   Nance Pear, MD  azithromycin (ZITHROMAX Z-PAK) 250 MG tablet Take 2 tablets (500 mg) on  Day 1,  followed by 1 tablet (250 mg) once daily on Days 2 through 5. 04/01/16   Jenise V Bacon Menshew, PA-C  budesonide-formoterol (SYMBICORT) 160-4.5 MCG/ACT inhaler Inhale 2 puffs into the lungs 2 (two) times daily.    Historical Provider, MD  desloratadine (CLARINEX) 5 MG tablet Take 5 mg by mouth daily.    Historical Provider, MD  fluconazole (DIFLUCAN) 150 MG tablet Take 1 tablet (150 mg total) by mouth once. 04/01/16   Jenise V Bacon Menshew, PA-C  ipratropium (ATROVENT) 0.02 % nebulizer solution Take 0.5 mg by nebulization 4 (four) times daily.    Historical Provider, MD  montelukast (SINGULAIR) 10 MG tablet Take 10 mg  by mouth at bedtime.    Historical Provider, MD  predniSONE (DELTASONE) 10 MG tablet Take 6 tabs daily x 3 days Take 5 tabs daily x 3 days Take 4 tabs daily x 3 days Take 3 tabs daily x 3 days Take 2 tabs daily x 3 days Take 1 tab daily x 3 days 04/01/16   Dannielle Karvonen Menshew, PA-C    REVIEW OF SYSTEMS:  Review of Systems  Constitutional: Negative for chills, fever, malaise/fatigue and weight loss.  HENT: Negative for ear pain, hearing loss and tinnitus.   Eyes: Negative for blurred vision, double vision, pain and redness.  Respiratory: Positive  for cough, shortness of breath and wheezing. Negative for hemoptysis.   Cardiovascular: Negative for chest pain, palpitations, orthopnea and leg swelling.  Gastrointestinal: Negative for abdominal pain, constipation, diarrhea, nausea and vomiting.  Genitourinary: Negative for dysuria, frequency and hematuria.  Musculoskeletal: Negative for back pain, joint pain and neck pain.  Skin:       No acne, rash, or lesions  Neurological: Negative for dizziness, tremors, focal weakness and weakness.  Endo/Heme/Allergies: Negative for polydipsia. Does not bruise/bleed easily.  Psychiatric/Behavioral: Negative for depression. The patient is not nervous/anxious and does not have insomnia.      VITAL SIGNS:   Vitals:   08/22/16 1956  BP: (!) 180/91  Pulse: (!) 109  Resp: (!) 24  Temp: 98.1 F (36.7 C)  TempSrc: Oral  SpO2: 95%  Weight: 93 kg (205 lb)  Height: 5\' 2"  (1.575 m)   Wt Readings from Last 3 Encounters:  08/22/16 93 kg (205 lb)  08/22/16 93 kg (205 lb)  03/31/16 92.5 kg (204 lb)    PHYSICAL EXAMINATION:  Physical Exam  Vitals reviewed. Constitutional: She is oriented to person, place, and time. She appears well-developed and well-nourished.  HENT:  Head: Normocephalic and atraumatic.  Mouth/Throat: Oropharynx is clear and moist.  Eyes: Conjunctivae and EOM are normal. Pupils are equal, round, and reactive to light. No scleral icterus.  Neck: Normal range of motion. Neck supple. No JVD present. No thyromegaly present.  Cardiovascular: Regular rhythm and intact distal pulses.  Exam reveals no gallop and no friction rub.   No murmur heard. Tachycardic  Respiratory: She is in respiratory distress (mild, patient able to converse, but does have difficulty completing longer sentences.). She has wheezes (Diffuse bilateral, expiratory greater than inspiratory, with overall mildly decreased air movement throughout). She has no rales.  GI: Soft. Bowel sounds are normal. She exhibits no  distension. There is no tenderness.  Musculoskeletal: Normal range of motion. She exhibits no edema.  No arthritis, no gout  Lymphadenopathy:    She has no cervical adenopathy.  Neurological: She is alert and oriented to person, place, and time. No cranial nerve deficit.  No dysarthria, no aphasia  Skin: Skin is warm and dry. No rash noted. No erythema.  Psychiatric: She has a normal mood and affect. Her behavior is normal. Judgment and thought content normal.    LABORATORY PANEL:   CBC  Recent Labs Lab 08/22/16 1145  WBC 9.2  HGB 14.4  HCT 43.6  PLT 203   ------------------------------------------------------------------------------------------------------------------  Chemistries   Recent Labs Lab 08/22/16 1145  NA 143  K 3.5  CL 107  CO2 24  GLUCOSE 128*  BUN 13  CREATININE 0.85  CALCIUM 9.7   ------------------------------------------------------------------------------------------------------------------  Cardiac Enzymes  Recent Labs Lab 08/22/16 1145  TROPONINI <0.03   ------------------------------------------------------------------------------------------------------------------  RADIOLOGY:  Dg Chest Portable 1 View  Result Date: 08/22/2016 CLINICAL DATA:  Short of breath and wheezing EXAM: PORTABLE CHEST 1 VIEW COMPARISON:  03/31/2016 FINDINGS: Heart size and vascularity normal. Negative for heart failure. Lungs are clear. Negative for infiltrate effusion or mass. Port-A-Cath tip in the SVC unchanged. IMPRESSION: No active disease. Electronically Signed   By: Franchot Gallo M.D.   On: 08/22/2016 13:40    EKG:   Orders placed or performed during the hospital encounter of 08/22/16  . EKG 12-Lead  . EKG 12-Lead    IMPRESSION AND PLAN:  Principal Problem:   Asthma exacerbation - IV Solu-Medrol, duo nebs when necessary, by mouth azithromycin, when necessary antitussive. Patient states that she does have a history of diabetes some point in the  past, though she currently is only diet-controlled and has glucoses have run within the normal range per her report. However, given the fact that she'll be on steroids, we will check her glucose values at least 3 times a day with meals for now. Requiring supplemental insulin, these can be discontinued. Active Problems:   RA (rheumatoid arthritis) ([place]) - she has been on significant prednisone throughout the year, though she states is for asthma. No other medications listed for are on her home meds. Will monitor her symptoms while here.   HTN (hypertension) - stable, also not on home meds for this, we will monitor closely  All the records are reviewed and case discussed with ED provider. Management plans discussed with the patient and/or family.  DVT PROPHYLAXIS: SubQ lovenox  GI PROPHYLAXIS: None  ADMISSION STATUS: Inpatient  CODE STATUS: Full Code Status History    This patient does not have a recorded code status. Please follow your organizational policy for patients in this situation.      TOTAL TIME TAKING CARE OF THIS PATIENT: 45 minutes.    Eligio Angert Calera 08/22/2016, 10:08 PM  [facility]  Office  [phone]  CC: Primary care physician; Pcp Not In System

## 2016-08-22 NOTE — ED Triage Notes (Signed)
Pt to triage via wheelchair. Pt reports hx of asthma. Pt reports she was here earlier but had to leave to take care of some personal matters. Pt reports she was supposed to be admitted. Pt reports the doctor told her to come back. Pt talking in complete sentences with noted shortness of breath while talking. Also noted wheezing.

## 2016-08-22 NOTE — ED Notes (Signed)
Pt still wheezing, MD notified.

## 2016-08-22 NOTE — ED Notes (Signed)
RT called for patient to be placed on bipap

## 2016-08-22 NOTE — ED Notes (Addendum)
Pt tolerated being off of bipap. Minimal wheezing, pt color WNL. RR unlabored while at rest. Pt states she is returning tonight for possible admission.

## 2016-08-22 NOTE — ED Notes (Signed)
MD at bedside. RT to come down and take patient off of bipap to see how patient tolerates.

## 2016-08-23 LAB — BASIC METABOLIC PANEL
Anion gap: 9 (ref 5–15)
BUN: 17 mg/dL (ref 6–20)
CO2: 23 mmol/L (ref 22–32)
Calcium: 10 mg/dL (ref 8.9–10.3)
Chloride: 108 mmol/L (ref 101–111)
Creatinine, Ser: 0.88 mg/dL (ref 0.44–1.00)
GFR calc Af Amer: >60 mL/min (ref >60)
GFR calc non Af Amer: >60 mL/min (ref >60)
Glucose, Bld: 297 mg/dL — ABNORMAL HIGH (ref 65–99)
Potassium: 3.9 mmol/L (ref 3.5–5.1)
Sodium: 140 mmol/L (ref 135–145)

## 2016-08-23 LAB — CBC
HCT: 40.8 % (ref 35.0–47.0)
Hemoglobin: 13.5 g/dL (ref 12.0–16.0)
MCH: 26.9 pg (ref 26.0–34.0)
MCHC: 33 g/dL (ref 32.0–36.0)
MCV: 81.4 fL (ref 80.0–100.0)
Platelets: 183 10*3/uL (ref 150–440)
RBC: 5.01 MIL/uL (ref 3.80–5.20)
RDW: 14.1 % (ref 11.5–14.5)
WBC: 8.8 10*3/uL (ref 3.6–11.0)

## 2016-08-23 LAB — GLUCOSE, CAPILLARY
Glucose-Capillary: 202 mg/dL — ABNORMAL HIGH (ref 65–99)
Glucose-Capillary: 218 mg/dL — ABNORMAL HIGH (ref 65–99)
Glucose-Capillary: 192 mg/dL — ABNORMAL HIGH (ref 65–99)

## 2016-08-23 MED ORDER — GUAIFENESIN ER 600 MG PO TB12
600.0000 mg | ORAL_TABLET | Freq: Two times a day (BID) | ORAL | Status: DC
  Administered 2016-08-23: 14:00:00 600 mg via ORAL
  Filled 2016-08-23: qty 1

## 2016-08-23 MED ORDER — GUAIFENESIN-DM 100-10 MG/5ML PO SYRP: 5.0000 mL | ORAL_SOLUTION | ORAL | 0 refills | Status: DC | PRN

## 2016-08-23 MED ORDER — IPRATROPIUM-ALBUTEROL 0.5-2.5 (3) MG/3ML IN SOLN: 3.0000 mL | Freq: Four times a day (QID) | RESPIRATORY_TRACT | Status: DC

## 2016-08-23 MED ORDER — IPRATROPIUM-ALBUTEROL 0.5-2.5 (3) MG/3ML IN SOLN
3.0000 mL | RESPIRATORY_TRACT | Status: DC
Start: 2016-08-23 — End: 2016-08-23
  Administered 2016-08-23 (×2): 3 mL via RESPIRATORY_TRACT
  Filled 2016-08-23 (×2): qty 3

## 2016-08-23 MED ORDER — AZITHROMYCIN 500 MG PO TABS: 500.0000 mg | ORAL_TABLET | Freq: Every day | ORAL | 0 refills | Status: DC

## 2016-08-23 NOTE — Progress Notes (Signed)
MD order received to discharge pt home at 1800 today; verbally reviewed AVS with pt including medications/gave Rxs to pt for Azithromycin and Robitussin DM; cardiac diet; activity as tolerated; follow up appointment/pt to call Primary Care Physician for appointment for 5 days; no questions voiced at this time; pt discharged via wheelchair by nursing to the visitor's entrance

## 2016-08-23 NOTE — Discharge Instructions (Signed)

## 2016-08-23 NOTE — Care Management Note (Signed)
Case Management Note  Patient Details  Name: [name] MRN: [id number] Date of Birth: [birth year]  Subjective/Objective:    Discussed Discharge planning with Ms [name] who reports that she is 100% disabled through the [place], sees a MD at the [facility] and receives her medication from the [place].  The Demographic record shows that she is uninsured. At Ms [name]'s request, this Probation officer faxed a copy of her current inpatient information to the [place]. Ms [name] was admitted last evening and is currently on the hospital discharge list. She has been discharged from [facility] at this time.                 Action/Plan:   Expected Discharge Date:                  Expected Discharge Plan:     In-House Referral:     Discharge planning Services     Post Acute Care Choice:    Choice offered to:     DME Arranged:    DME Agency:     HH Arranged:    HH Agency:     Status of Service:     If discussed at [employer] of Stay Meetings, dates discussed:    Additional Comments:  Gloriana Piltz A, RN 08/23/2016, 4:02 PM

## 2016-08-24 NOTE — Discharge Summary (Signed)
Sandra Fischer, 65 y.o., DOB Feb 15, 1951, MRN [id number]. Admission date: 08/22/2016 Discharge Date 08/24/2016 Primary MD Pcp Not In System Admitting Physician Lance Coon, MD  Admission Diagnosis  SOB (shortness of breath) [R06.02] Asthma exacerbation [J45.901]  Discharge Diagnosis   Principal Problem:  Acute on chronic Asthma exacerbation Acute bronchitis   RA (rheumatoid arthritis) ([place])   HTN (hypertension) Essential hypertension        Hospital Course patient is a 65 year old with history of asthma who presented to the ED with complaint of shortness of breath. They were planning to admit her when she insisted that she go home to arrange things for her mother. Then she came back with shortness of breath and wheezing at to be placed on BiPAP. She agreed to be admitted she was started on therapy with nebulizers and antibiotics and steroids. When I saw her in the morning patient wanted to be discharged home again. I recommended that she stay and we will continue that therapy for asthma exasperation. However patient continued to insist on being discharged. Her oxygen saturations were normal on room air. Her symptoms have improved. Therefore she is being discharged to home.           Consults  None  Significant Tests:  See full reports for all details     Dg Chest Portable 1 View  Result Date: 08/22/2016 CLINICAL DATA:  Short of breath and wheezing EXAM: PORTABLE CHEST 1 VIEW COMPARISON:  03/31/2016 FINDINGS: Heart size and vascularity normal. Negative for heart failure. Lungs are clear. Negative for infiltrate effusion or mass. Port-A-Cath tip in the SVC unchanged. IMPRESSION: No active disease. Electronically Signed   By: Franchot Gallo M.D.   On: 08/22/2016 13:40       Today   Subjective:   Sandra Fischer  still wheezing and shortness of breath with some improvement  Objective:   Blood pressure (!) 147/61, pulse 99, temperature 98.5 F (36.9 C), temperature source Oral,  resp. rate 20, height 5\' 2"  (1.575 m), weight 91.6 kg (202 lb), SpO2 94 %.  . No intake or output data in the 24 hours ending 08/24/16 1538  Exam VITAL SIGNS: Blood pressure (!) 147/61, pulse 99, temperature 98.5 F (36.9 C), temperature source Oral, resp. rate 20, height 5\' 2"  (1.575 m), weight 91.6 kg (202 lb), SpO2 94 %.  GENERAL:  65 y.o.-year-old patient lying in the bed with no acute distress.  EYES: Pupils equal, round, reactive to light and accommodation. No scleral icterus. Extraocular muscles intact.  HEENT: Head atraumatic, normocephalic. Oropharynx and nasopharynx clear.  NECK:  Supple, no jugular venous distention. No thyroid enlargement, no tenderness.  LUNGS:Has bilateral wheezing, rales,rhonchi or crepitation. No use of accessory muscles of respiration.  CARDIOVASCULAR: S1, S2 normal. No murmurs, rubs, or gallops.  ABDOMEN: Soft, nontender, nondistended. Bowel sounds present. No organomegaly or mass.  EXTREMITIES: No pedal edema, cyanosis, or clubbing.  NEUROLOGIC: Cranial nerves II through XII are intact. Muscle strength 5/5 in all extremities. Sensation intact. Gait not checked.  PSYCHIATRIC: The patient is alert and oriented x 3.  SKIN: No obvious rash, lesion, or ulcer.   Data Review     CBC w Diff:  Lab Results  Component Value Date   WBC 8.8 08/23/2016   HGB 13.5 08/23/2016   HCT 40.8 08/23/2016   PLT 183 08/23/2016   LYMPHOPCT 44 08/22/2016   MONOPCT 11 08/22/2016   EOSPCT 12 08/22/2016   BASOPCT 1 08/22/2016   CMP:  Lab Results  Component  Value Date   NA 140 08/23/2016   K 3.9 08/23/2016   CL 108 08/23/2016   CO2 23 08/23/2016   BUN 17 08/23/2016   CREATININE 0.88 08/23/2016  .  Micro Results No results found for this or any previous visit (from the past 240 hour(s)).   Code Status History    Date Active Date Inactive Code Status Order ID Comments User Context   08/22/2016 11:11 PM 08/23/2016  9:53 PM Full Code [id number]  Lance Coon, MD  Inpatient          Follow-up Information    pcp Follow up in 5 day(s).           Discharge Medications     Medication List    TAKE these medications   Abatacept 125 MG/ML Soaj Take 750 mg by mouth.   albuterol 1.25 MG/3ML nebulizer solution Commonly known as:  ACCUNEB Take 1 ampule by nebulization every 6 (six) hours as needed for wheezing.   albuterol 108 (90 Base) MCG/ACT inhaler Commonly known as:  PROVENTIL HFA;VENTOLIN HFA Inhale into the lungs every 6 (six) hours as needed for wheezing or shortness of breath.   albuterol 108 (90 Base) MCG/ACT inhaler Commonly known as:  PROVENTIL HFA;VENTOLIN HFA Inhale 2 puffs into the lungs every 6 (six) hours as needed for wheezing or shortness of breath.   aspirin EC 81 MG tablet Take 81 mg by mouth daily.   azithromycin 500 MG tablet Commonly known as:  ZITHROMAX Take 1 tablet (500 mg total) by mouth daily. What changed:  medication strength  how much to take  how to take this  when to take this  additional instructions   budesonide-formoterol 160-4.5 MCG/ACT inhaler Commonly known as:  SYMBICORT Inhale 2 puffs into the lungs 2 (two) times daily.   cetirizine 10 MG tablet Commonly known as:  ZYRTEC Take 10 mg by mouth daily.   desloratadine 5 MG tablet Commonly known as:  CLARINEX Take 5 mg by mouth daily.   ferrous sulfate 325 (65 FE) MG tablet Take 325 mg by mouth daily with breakfast.   fluconazole 150 MG tablet Commonly known as:  DIFLUCAN Take 1 tablet (150 mg total) by mouth once.   guaiFENesin-dextromethorphan 100-10 MG/5ML syrup Commonly known as:  ROBITUSSIN DM Take 5 mLs by mouth every 4 (four) hours as needed for cough.   ipratropium 0.02 % nebulizer solution Commonly known as:  ATROVENT Take 0.5 mg by nebulization 4 (four) times daily.   levothyroxine 88 MCG tablet Commonly known as:  SYNTHROID, LEVOTHROID Take 88 mcg by mouth daily before breakfast.   lisinopril 40 MG  tablet Commonly known as:  PRINIVIL,ZESTRIL Take 40 mg by mouth daily.   montelukast 10 MG tablet Commonly known as:  SINGULAIR Take 10 mg by mouth at bedtime.   multivitamin-prenatal 27-0.8 MG Tabs tablet Take 1 tablet by mouth daily at 12 noon.   omeprazole 10 MG capsule Commonly known as:  PRILOSEC Take 20 mg by mouth daily.   pravastatin 40 MG tablet Commonly known as:  PRAVACHOL Take 40 mg by mouth daily.   predniSONE 10 MG tablet Commonly known as:  DELTASONE Take 6 tabs daily x 3 days Take 5 tabs daily x 3 days Take 4 tabs daily x 3 days Take 3 tabs daily x 3 days Take 2 tabs daily x 3 days Take 1 tab daily x 3 days   traMADol 50 MG tablet Commonly known as:  ULTRAM Take 100 mg by  mouth as needed (for rheumatoid arthritis).   verapamil 120 MG tablet Commonly known as:  CALAN Take 240 mg by mouth 2 (two) times daily.          Total Time in preparing paper work, data evaluation and todays exam - 35 minutes  Dustin Flock M.D on 08/24/2016 at 3:38 PM  [facility]   Office  [phone]

## 2016-09-25 ENCOUNTER — Encounter: Payer: Self-pay | Admitting: *Deleted

## 2016-09-25 ENCOUNTER — Emergency Department
Admission: EM | Admit: 2016-09-25 | Discharge: 2016-09-25 | Disposition: A | Discharge status: Home / Self Care | Payer: Non-veteran care | Attending: Emergency Medicine | Admitting: Emergency Medicine

## 2016-09-25 DIAGNOSIS — Z79899 Other long term (current) drug therapy: Secondary | ICD-10-CM | POA: Insufficient documentation

## 2016-09-25 DIAGNOSIS — J45901 Unspecified asthma with (acute) exacerbation: Secondary | ICD-10-CM | POA: Diagnosis not present

## 2016-09-25 DIAGNOSIS — W57XXXA Bitten or stung by nonvenomous insect and other nonvenomous arthropods, initial encounter: Secondary | ICD-10-CM | POA: Diagnosis not present

## 2016-09-25 DIAGNOSIS — Z7982 Long term (current) use of aspirin: Secondary | ICD-10-CM | POA: Diagnosis not present

## 2016-09-25 DIAGNOSIS — Y999 Unspecified external cause status: Secondary | ICD-10-CM | POA: Insufficient documentation

## 2016-09-25 DIAGNOSIS — Y939 Activity, unspecified: Secondary | ICD-10-CM | POA: Insufficient documentation

## 2016-09-25 DIAGNOSIS — L03115 Cellulitis of right lower limb: Secondary | ICD-10-CM | POA: Insufficient documentation

## 2016-09-25 DIAGNOSIS — Y929 Unspecified place or not applicable: Secondary | ICD-10-CM | POA: Diagnosis not present

## 2016-09-25 DIAGNOSIS — I10 Essential (primary) hypertension: Secondary | ICD-10-CM | POA: Diagnosis not present

## 2016-09-25 DIAGNOSIS — S80861A Insect bite (nonvenomous), right lower leg, initial encounter: Secondary | ICD-10-CM | POA: Diagnosis present

## 2016-09-25 MED ORDER — PREDNISONE 10 MG PO TABS: 10.0000 mg | ORAL_TABLET | Freq: Every day | ORAL | 0 refills | Status: DC

## 2016-09-25 MED ORDER — METHYLPREDNISOLONE SODIUM SUCC 125 MG IJ SOLR
125.0000 mg | Freq: Once | INTRAMUSCULAR | Status: AC
  Administered 2016-09-25: 125 mg via INTRAMUSCULAR
  Filled 2016-09-25: qty 2

## 2016-09-25 MED ORDER — IPRATROPIUM-ALBUTEROL 0.5-2.5 (3) MG/3ML IN SOLN
3.0000 mL | Freq: Once | RESPIRATORY_TRACT | Status: AC
  Administered 2016-09-25: 3 mL via RESPIRATORY_TRACT
  Filled 2016-09-25: qty 3

## 2016-09-25 MED ORDER — SULFAMETHOXAZOLE-TRIMETHOPRIM 800-160 MG PO TABS: 1.0000 | ORAL_TABLET | Freq: Two times a day (BID) | ORAL | 0 refills | Status: DC

## 2016-09-25 NOTE — ED Triage Notes (Signed)
Pt reports being bit by an unknown insect Tuesday. PT reports feeling the bite but did not see the bug. Redness noted to back of right calf. No fevers reported. Pt also reports having chronic wheezing from asthma that is treated with PCP but was requesting prednisone while at hospital to help with wheezing.

## 2016-09-25 NOTE — ED Provider Notes (Signed)
[facility] Emergency Department Provider Note  ____________________________________________  Time seen: Approximately 5:40 PM  I have reviewed the triage vital signs and the nursing notes.   HISTORY  Chief Complaint Wheezing and Insect Bite    HPI Sandra Fischer is a 65 y.o. female who presents emergency Department with 2 complaints. Patient states that she was bit by an insect 3 days ago. Initially the area was mildly irritated and red. She states that over the last 24-36 hours area has started to swell and become painful. She denies any drainage. She denies any fevers or chills. Area is mildly tender to palpation.  Patient also has a significant history of asthma which she is being followed by primary care at the [facility] health system. Patient states that she has normal medications at home to help deal with that when she starts to have asthma exacerbation she typically needs steroids. Patient denies any difficulty breathing but does report some wheezing. No fevers or chills, chest pain no nausea or vomiting.   Past Medical History:  Diagnosis Date  . Asthma   . Hypertension   . RA (rheumatoid arthritis) [facility])     Patient Active Problem List   Diagnosis Date Noted  . Asthma exacerbation 08/22/2016  . RA (rheumatoid arthritis) ([place]) 08/22/2016  . HTN (hypertension) 08/22/2016    Past Surgical History:  Procedure Laterality Date  . CATARACT EXTRACTION    . CHOLECYSTECTOMY      Prior to Admission medications   Medication Sig Start Date End Date Taking? Authorizing Provider  Abatacept 125 MG/ML SOAJ Take 750 mg by mouth.    Historical Provider, MD  albuterol (ACCUNEB) 1.25 MG/3ML nebulizer solution Take 1 ampule by nebulization every 6 (six) hours as needed for wheezing.    Historical Provider, MD  albuterol (PROVENTIL HFA;VENTOLIN HFA) 108 (90 Base) MCG/ACT inhaler Inhale into the lungs every 6 (six) hours as needed for wheezing or shortness of breath.     Historical Provider, MD  albuterol (PROVENTIL HFA;VENTOLIN HFA) 108 (90 Base) MCG/ACT inhaler Inhale 2 puffs into the lungs every 6 (six) hours as needed for wheezing or shortness of breath. 08/22/16   Nance Pear, MD  aspirin EC 81 MG tablet Take 81 mg by mouth daily.    Historical Provider, MD  azithromycin (ZITHROMAX) 500 MG tablet Take 1 tablet (500 mg total) by mouth daily. 08/23/16   Dustin Flock, MD  budesonide-formoterol (SYMBICORT) 160-4.5 MCG/ACT inhaler Inhale 2 puffs into the lungs 2 (two) times daily.    Historical Provider, MD  cetirizine (ZYRTEC) 10 MG tablet Take 10 mg by mouth daily.    Historical Provider, MD  desloratadine (CLARINEX) 5 MG tablet Take 5 mg by mouth daily.    Historical Provider, MD  ferrous sulfate 325 (65 FE) MG tablet Take 325 mg by mouth daily with breakfast.    Historical Provider, MD  fluconazole (DIFLUCAN) 150 MG tablet Take 1 tablet (150 mg total) by mouth once. Patient not taking: Reported on 08/23/2016 04/01/16   Alita Chyle Bacon Menshew, PA-C  guaiFENesin-dextromethorphan (ROBITUSSIN DM) 100-10 MG/5ML syrup Take 5 mLs by mouth every 4 (four) hours as needed for cough. 08/23/16   Dustin Flock, MD  ipratropium (ATROVENT) 0.02 % nebulizer solution Take 0.5 mg by nebulization 4 (four) times daily.    Historical Provider, MD  levothyroxine (SYNTHROID, LEVOTHROID) 88 MCG tablet Take 88 mcg by mouth daily before breakfast.    Historical Provider, MD  lisinopril (PRINIVIL,ZESTRIL) 40 MG tablet Take 40  mg by mouth daily.    Historical Provider, MD  montelukast (SINGULAIR) 10 MG tablet Take 10 mg by mouth at bedtime.    Historical Provider, MD  omeprazole (PRILOSEC) 10 MG capsule Take 20 mg by mouth daily.    Historical Provider, MD  pravastatin (PRAVACHOL) 40 MG tablet Take 40 mg by mouth daily.    Historical Provider, MD  predniSONE (DELTASONE) 10 MG tablet Take 1 tablet (10 mg total) by mouth daily. 09/25/16   Charline Bills Cuthriell, PA-C  Prenatal Vit-Fe  Fumarate-FA (MULTIVITAMIN-PRENATAL) 27-0.8 MG TABS tablet Take 1 tablet by mouth daily at 12 noon.    Historical Provider, MD  sulfamethoxazole-trimethoprim (BACTRIM DS,SEPTRA DS) 800-160 MG tablet Take 1 tablet by mouth 2 (two) times daily. 09/25/16   Charline Bills Cuthriell, PA-C  traMADol (ULTRAM) 50 MG tablet Take 100 mg by mouth as needed (for rheumatoid arthritis).    Historical Provider, MD  verapamil (CALAN) 120 MG tablet Take 240 mg by mouth 2 (two) times daily.    Historical Provider, MD    Allergies Codeine; Latex; and Penicillins  Family History  Problem Relation Age of Onset  . Diabetes Mother   . Hypertension Mother   . Hyperlipidemia Mother     Social History Social History  Substance Use Topics  . Smoking status: Never Smoker  . Smokeless tobacco: Never Used  . Alcohol use No     Review of Systems  Constitutional: No fever/chills Eyes: No visual changes. No discharge ENT: No upper respiratory complaints. Cardiovascular: no chest pain. Respiratory: no cough. No SOB. Positive for wheezing. Gastrointestinal: No abdominal pain.  No nausea, no vomiting.  No diarrhea.  No constipation. Musculoskeletal: Negative for musculoskeletal pain. Skin: Positive for "infected bug bite." Neurological: Negative for headaches, focal weakness or numbness. 10-point ROS otherwise negative.  ____________________________________________   PHYSICAL EXAM:  VITAL SIGNS: ED Triage Vitals  Enc Vitals Group     BP 09/25/16 1704 137/75     Pulse Rate 09/25/16 1704 83     Resp 09/25/16 1704 16     Temp 09/25/16 1704 98.7 F (37.1 C)     Temp Source 09/25/16 1704 Oral     SpO2 09/25/16 1704 96 %     Weight 09/25/16 1705 210 lb (95.3 kg)     Height 09/25/16 1705 5\' 2"  (1.575 m)     Head Circumference --      Peak Flow --      Pain Score 09/25/16 1709 3     Pain Loc --      Pain Edu? --      Excl. in [place]? --      Constitutional: Alert and oriented. Well appearing and in no acute  distress. Eyes: Conjunctivae are normal. PERRL. EOMI. Head: Atraumatic. ENT:      Ears:       Nose: No congestion/rhinnorhea.      Mouth/Throat: Mucous membranes are moist. Oropharynx nonerythematous and nonedematous. Neck: No stridor.    Cardiovascular: Normal rate, regular rhythm. Normal S1 and S2.  Good peripheral circulation. Respiratory: Normal respiratory effort without tachypnea or retractions. Lungs diffuse expiratory wheezing. No rales rhonchi.Kermit Balo air entry to the bases with no decreased or absent breath sounds. Musculoskeletal: Full range of motion to all extremities. No gross deformities appreciated. Neurologic:  Normal speech and language. No gross focal neurologic deficits are appreciated.  Skin:  Skin is warm, dry and intact. No rash noted. Central erythematous lesion noted to posterior right calf. This  does appear to be a bug bite. Mild surrounding erythema and edema measuring approximately 8 cm in diameter is noted surrounding the central area. No induration or fluctuance. No drainage. Area is mildly tender to palpation. Psychiatric: Mood and affect are normal. Speech and behavior are normal. Patient exhibits appropriate insight and judgement.   ____________________________________________   LABS (all labs ordered are listed, but only abnormal results are displayed)  Labs Reviewed - No data to display ____________________________________________  EKG   ____________________________________________  RADIOLOGY   No results found.  ____________________________________________    PROCEDURES  Procedure(s) performed:    Procedures    Medications  methylPREDNISolone sodium succinate (SOLU-MEDROL) 125 mg/2 mL injection 125 mg (not administered)  ipratropium-albuterol (DUONEB) 0.5-2.5 (3) MG/3ML nebulizer solution 3 mL (not administered)     ____________________________________________   INITIAL IMPRESSION / ASSESSMENT AND PLAN / ED COURSE  Pertinent  labs & imaging results that were available during my care of the patient were reviewed by me and considered in my medical decision making (see chart for details).  Review of the [place] CSRS was performed in accordance of the [place] prior to dispensing any controlled drugs.  Clinical Course    Patient's diagnosis is consistent with Cellulitis and mild asthma exacerbation. Patient has chronic respiratory problems associated with her asthma. She is being closely followed by the [facility] health system and has follow-up appointments within the next week. Patient is given injection of steroids in the emergency department.. Patient will be discharged home with prescriptions for antibiotics and oral prednisone. Patient has both inhalers and nebulizer treatments at home and is instructed to continue use of these.. Patient is to follow up with primary care as needed or otherwise directed. Patient is given ED precautions to return to the ED for any worsening or new symptoms.     ____________________________________________  FINAL CLINICAL IMPRESSION(S) / ED DIAGNOSES  Final diagnoses:  Cellulitis of right lower extremity  Asthma exacerbation      NEW MEDICATIONS STARTED DURING THIS VISIT:  New Prescriptions   PREDNISONE (DELTASONE) 10 MG TABLET    Take 1 tablet (10 mg total) by mouth daily.   SULFAMETHOXAZOLE-TRIMETHOPRIM (BACTRIM DS,SEPTRA DS) 800-160 MG TABLET    Take 1 tablet by mouth 2 (two) times daily.        This chart was dictated using voice recognition software/Dragon. Despite best efforts to proofread, errors can occur which can change the meaning. Any change was purely unintentional.    Darletta Moll, PA-C 09/25/16 1751    Harvest Dark, MD 09/25/16 2259

## 2017-07-20 ENCOUNTER — Encounter: Payer: Non-veteran care | Attending: Internal Medicine

## 2017-07-20 VITALS — Ht 62.2 in | Wt 211.2 lb

## 2017-07-20 DIAGNOSIS — Z7982 Long term (current) use of aspirin: Secondary | ICD-10-CM | POA: Insufficient documentation

## 2017-07-20 DIAGNOSIS — J4550 Severe persistent asthma, uncomplicated: Secondary | ICD-10-CM | POA: Diagnosis present

## 2017-07-20 DIAGNOSIS — I10 Essential (primary) hypertension: Secondary | ICD-10-CM | POA: Diagnosis not present

## 2017-07-20 DIAGNOSIS — Z79899 Other long term (current) drug therapy: Secondary | ICD-10-CM | POA: Diagnosis not present

## 2017-07-20 DIAGNOSIS — Z7951 Long term (current) use of inhaled steroids: Secondary | ICD-10-CM | POA: Insufficient documentation

## 2017-07-20 DIAGNOSIS — M069 Rheumatoid arthritis, unspecified: Secondary | ICD-10-CM | POA: Diagnosis not present

## 2017-07-20 NOTE — Progress Notes (Signed)
Pulmonary Individual Treatment Plan  Patient Details  Name: Sandra Fischer MRN: [id number] Date of Birth: 11/17/1951 Referring Provider:     Pulmonary Rehab from 07/20/2017 in [facility] Cardiac and Pulmonary Rehab  Referring Provider  [facility]      Initial Encounter Date:    Pulmonary Rehab from 07/20/2017 in [facility] Cardiac and Pulmonary Rehab  Date  07/20/17  Referring Provider  [facility]      Visit Diagnosis: Severe persistent asthma without complication  Patient's Home Medications on Admission:  Current Outpatient Prescriptions:  .  Abatacept 125 MG/ML SOAJ, Take 750 mg by mouth., Disp: , Rfl:  .  albuterol (ACCUNEB) 1.25 MG/3ML nebulizer solution, Take 1 ampule by nebulization every 6 (six) hours as needed for wheezing., Disp: , Rfl:  .  albuterol (PROVENTIL HFA;VENTOLIN HFA) 108 (90 Base) MCG/ACT inhaler, Inhale into the lungs every 6 (six) hours as needed for wheezing or shortness of breath., Disp: , Rfl:  .  albuterol (PROVENTIL HFA;VENTOLIN HFA) 108 (90 Base) MCG/ACT inhaler, Inhale 2 puffs into the lungs every 6 (six) hours as needed for wheezing or shortness of breath., Disp: 1 Inhaler, Rfl: 0 .  aspirin EC 81 MG tablet, Take 81 mg by mouth daily., Disp: , Rfl:  .  azithromycin (ZITHROMAX) 500 MG tablet, Take 1 tablet (500 mg total) by mouth daily., Disp: 4 tablet, Rfl: 0 .  budesonide-formoterol (SYMBICORT) 160-4.5 MCG/ACT inhaler, Inhale 2 puffs into the lungs 2 (two) times daily., Disp: , Rfl:  .  cetirizine (ZYRTEC) 10 MG tablet, Take 10 mg by mouth daily., Disp: , Rfl:  .  desloratadine (CLARINEX) 5 MG tablet, Take 5 mg by mouth daily., Disp: , Rfl:  .  ferrous sulfate 325 (65 FE) MG tablet, Take 325 mg by mouth daily with breakfast., Disp: , Rfl:  .  fluconazole (DIFLUCAN) 150 MG tablet, Take 1 tablet (150 mg total) by mouth once. (Patient not taking: Reported on 08/23/2016), Disp: 2 tablet, Rfl: 1 .  guaiFENesin-dextromethorphan (ROBITUSSIN DM) 100-10 MG/5ML syrup, Take 5  mLs by mouth every 4 (four) hours as needed for cough., Disp: 118 mL, Rfl: 0 .  ipratropium (ATROVENT) 0.02 % nebulizer solution, Take 0.5 mg by nebulization 4 (four) times daily., Disp: , Rfl:  .  levothyroxine (SYNTHROID, LEVOTHROID) 88 MCG tablet, Take 88 mcg by mouth daily before breakfast., Disp: , Rfl:  .  lisinopril (PRINIVIL,ZESTRIL) 40 MG tablet, Take 40 mg by mouth daily., Disp: , Rfl:  .  montelukast (SINGULAIR) 10 MG tablet, Take 10 mg by mouth at bedtime., Disp: , Rfl:  .  omeprazole (PRILOSEC) 10 MG capsule, Take 20 mg by mouth daily., Disp: , Rfl:  .  pravastatin (PRAVACHOL) 40 MG tablet, Take 40 mg by mouth daily., Disp: , Rfl:  .  predniSONE (DELTASONE) 10 MG tablet, Take 1 tablet (10 mg total) by mouth daily., Disp: 42 tablet, Rfl: 0 .  Prenatal Vit-Fe Fumarate-FA (MULTIVITAMIN-PRENATAL) 27-0.8 MG TABS tablet, Take 1 tablet by mouth daily at 12 noon., Disp: , Rfl:  .  sulfamethoxazole-trimethoprim (BACTRIM DS,SEPTRA DS) 800-160 MG tablet, Take 1 tablet by mouth 2 (two) times daily., Disp: 14 tablet, Rfl: 0 .  traMADol (ULTRAM) 50 MG tablet, Take 100 mg by mouth as needed (for rheumatoid arthritis)., Disp: , Rfl:  .  verapamil (CALAN) 120 MG tablet, Take 240 mg by mouth 2 (two) times daily., Disp: , Rfl:   Past Medical History: Past Medical History:  Diagnosis Date  . Asthma   .  Hypertension   . RA (rheumatoid arthritis) (HCC)     Tobacco Use: History  Smoking Status  . Never Smoker  Smokeless Tobacco  . Never Used    Labs: Recent Review Flowsheet Data    There is no flowsheet data to display.       ADL UCSD:     Pulmonary Assessment Scores    Row Name 07/20/17 1619         ADL UCSD   ADL Phase Entry     SOB Score total 40     Rest 1     Walk 2     Stairs 4     Bath 1     Dress 2     Shop 3       mMRC Score   mMRC Score 4        Pulmonary Function Assessment:     Pulmonary Function Assessment - 07/20/17 1657      Pulmonary Function  Tests   FVC% 62 %   FEV1% 39 %   FEV1/FVC Ratio 50      Exercise Target Goals: Date: 07/20/17  Exercise Program Goal: Individual exercise prescription set with THRR, safety & activity barriers. Participant demonstrates ability to understand and report RPE using BORG scale, to self-measure pulse accurately, and to acknowledge the importance of the exercise prescription.  Exercise Prescription Goal: Starting with aerobic activity 30 plus minutes a day, 3 days per week for initial exercise prescription. Provide home exercise prescription and guidelines that participant acknowledges understanding prior to discharge.  Activity Barriers & Risk Stratification:     Activity Barriers & Cardiac Risk Stratification - 07/20/17 1613      Activity Barriers & Cardiac Risk Stratification   Activity Barriers Arthritis;Deconditioning;Muscular Weakness;Shortness of Breath;Joint Problems  Rhuematoid Arthritis, joints swell (especially ankles, hips, and knees0      6 Minute Walk:     6 Minute Walk    Row Name 07/20/17 1611         6 Minute Walk   Phase Initial     Distance 1122 feet     Walk Time 6 minutes     # of Rest Breaks 0     MPH 2.13     METS 2.4     RPE 13     Perceived Dyspnea  3     VO2 Peak 8.41     Symptoms Yes (comment)     Comments SOB, chest tightness with breathing heavier     Resting HR 75 bpm     Resting BP 138/64     Max Ex. HR 105 bpm     Max Ex. BP 134/56     2 Minute Post BP 130/60       Interval HR   Baseline HR 75     3 Minute HR 105     6 Minute HR 105     2 Minute Post HR 75     Interval Heart Rate? Yes  pulse oximeter did not pick up heart rate very well       Interval Oxygen   Interval Oxygen? Yes     Baseline Oxygen Saturation % 95 %     Baseline Liters of Oxygen 0 L  Room Air     1 Minute Oxygen Saturation % 94 %     1 Minute Liters of Oxygen 0 L     2 Minute Oxygen Saturation % 93 %     2  Minute Liters of Oxygen 0 L     3 Minute Oxygen  Saturation % 95 %     3 Minute Liters of Oxygen 0 L     4 Minute Oxygen Saturation % 95 %     4 Minute Liters of Oxygen 0 L     5 Minute Oxygen Saturation % 96 %     5 Minute Liters of Oxygen 0 L     6 Minute Oxygen Saturation % 96 %     6 Minute Liters of Oxygen 0 L     2 Minute Post Oxygen Saturation % 98 %     2 Minute Post Liters of Oxygen 0 L       Oxygen Initial Assessment:     Oxygen Initial Assessment - 07/20/17 1655      Home Oxygen   Home Oxygen Device None   Sleep Oxygen Prescription None   Home Exercise Oxygen Prescription None   Home at Rest Exercise Oxygen Prescription None     Initial 6 min Walk   Oxygen Used None     Intervention   Short Term Goals To learn and understand importance of monitoring SPO2 with pulse oximeter and demonstrate accurate use of the pulse oximeter.;To Learn and understand importance of maintaining oxygen saturations>88%;To learn and demonstrate proper purse lipped breathing techniques or other breathing techniques.;To learn and demonstrate proper use of respiratory medications   Long  Term Goals Maintenance of O2 saturations>88%;Compliance with respiratory medication;Demonstrates proper use of MDI's;Exhibits proper breathing techniques, such as purse lipped breathing or other method taught during program session;Verbalizes importance of monitoring SPO2 with pulse oximeter and return demonstration      Oxygen Re-Evaluation:   Oxygen Discharge (Final Oxygen Re-Evaluation):   Initial Exercise Prescription:     Initial Exercise Prescription - 07/20/17 1600      Date of Initial Exercise RX and Referring Provider   Date 07/20/17   Referring Provider [facility]     Treadmill   MPH 1.8   Grade 0   Minutes 15   METs 2.38     NuStep   Level 1   SPM 80   Minutes 15   METs 2     REL-XR   Level 1   Speed 50   Minutes 15   METs 2     Prescription Details   Frequency (times per week) 3   Duration Progress to 45 minutes of  aerobic exercise without signs/symptoms of physical distress     Intensity   THRR 40-80% of Max Heartrate 108-141   Ratings of Perceived Exertion 11-13   Perceived Dyspnea 0-4     Progression   Progression Continue to progress workloads to maintain intensity without signs/symptoms of physical distress.     Resistance Training   Training Prescription Yes   Weight 2 lbs   Reps 10-15      Perform Capillary Blood Glucose checks as needed.  Exercise Prescription Changes:   Exercise Comments:   Exercise Goals and Review:     Exercise Goals    Row Name 07/20/17 1615             Exercise Goals   Increase Physical Activity Yes       Intervention Provide advice, education, support and counseling about physical activity/exercise needs.;Develop an individualized exercise prescription for aerobic and resistive training based on initial evaluation findings, risk stratification, comorbidities and participant's personal goals.       Expected Outcomes Achievement  of increased cardiorespiratory fitness and enhanced flexibility, muscular endurance and strength shown through measurements of functional capacity and personal statement of participant.       Increase Strength and Stamina Yes       Intervention Provide advice, education, support and counseling about physical activity/exercise needs.;Develop an individualized exercise prescription for aerobic and resistive training based on initial evaluation findings, risk stratification, comorbidities and participant's personal goals.       Expected Outcomes Achievement of increased cardiorespiratory fitness and enhanced flexibility, muscular endurance and strength shown through measurements of functional capacity and personal statement of participant.          Exercise Goals Re-Evaluation :   Discharge Exercise Prescription (Final Exercise Prescription Changes):   Nutrition:  Target Goals: Understanding of nutrition guidelines, daily  intake of sodium 1500mg , cholesterol 200mg , calories 30% from fat and 7% or less from saturated fats, daily to have 5 or more servings of fruits and vegetables.  Biometrics:     Pre Biometrics - 07/20/17 1615      Pre Biometrics   Height 5' 2.2" (1.58 m)   Weight 211 lb 3.2 oz (95.8 kg)   Waist Circumference 41 inches   Hip Circumference 43.5 inches   Waist to Hip Ratio 0.94 %   BMI (Calculated) 38.5       Nutrition Therapy Plan and Nutrition Goals:     Nutrition Therapy & Goals - 07/20/17 1659      Intervention Plan   Intervention Prescribe, educate and counsel regarding individualized specific dietary modifications aiming towards targeted core components such as weight, hypertension, lipid management, diabetes, heart failure and other comorbidities.;Nutrition handout(s) given to patient.   Expected Outcomes Short Term Goal: Understand basic principles of dietary content, such as calories, fat, sodium, cholesterol and nutrients.;Short Term Goal: A plan has been developed with personal nutrition goals set during dietitian appointment.;Long Term Goal: Adherence to prescribed nutrition plan.      Nutrition Discharge: Rate Your Plate Scores:   Nutrition Goals Re-Evaluation:   Nutrition Goals Discharge (Final Nutrition Goals Re-Evaluation):   Psychosocial: Target Goals: Acknowledge presence or absence of significant depression and/or stress, maximize coping skills, provide positive support system. Participant is able to verbalize types and ability to use techniques and skills needed for reducing stress and depression.   Initial Review & Psychosocial Screening:     Initial Psych Review & Screening - 07/20/17 1700      Initial Review   Current issues with None Identified     Family Dynamics   Good Support System? Yes     Barriers   Psychosocial barriers to participate in program The patient should benefit from training in stress management and relaxation.      Screening Interventions   Interventions Program counselor consult      Quality of Life Scores:     Quality of Life - 07/20/17 1620      Quality of Life Scores   Health/Function Pre 15.25 %   Socioeconomic Pre 19.36 %   Psych/Spiritual Pre 19.79 %   Family Pre 21.25 %   GLOBAL Pre 17.74 %      PHQ-9: Recent Review Flowsheet Data    Depression screen [facility] 2/9 07/20/2017   Decreased Interest 1   Down, Depressed, Hopeless 0   PHQ - 2 Score 1   Altered sleeping 2   Tired, decreased energy 3   Change in appetite 2   Feeling bad or failure about yourself  1   Trouble  concentrating 3   Moving slowly or fidgety/restless 0   Suicidal thoughts 0   PHQ-9 Score 12   Difficult doing work/chores Somewhat difficult     Interpretation of Total Score  Total Score Depression Severity:  1-4 = Minimal depression, 5-9 = Mild depression, 10-14 = Moderate depression, 15-19 = Moderately severe depression, 20-27 = Severe depression   Psychosocial Evaluation and Intervention:   Psychosocial Re-Evaluation:   Psychosocial Discharge (Final Psychosocial Re-Evaluation):   Education: Education Goals: Education classes will be provided on a weekly basis, covering required topics. Participant will state understanding/return demonstration of topics presented.  Learning Barriers/Preferences:     Learning Barriers/Preferences - 07/20/17 1656      Learning Barriers/Preferences   Learning Barriers None   Learning Preferences None      Education Topics: Initial Evaluation Education: - Verbal, written and demonstration of respiratory meds, RPE/PD scales, oximetry and breathing techniques. Instruction on use of nebulizers and MDIs: cleaning and proper use, rinsing mouth with steroid doses and importance of monitoring MDI activations.   Pulmonary Rehab from 07/20/2017 in [facility] Cardiac and Pulmonary Rehab  Date  07/20/17  Educator  [facility]  Instruction Review Code  2- meets goals/outcomes       General Nutrition Guidelines/Fats and Fiber: -Group instruction provided by verbal, written material, models and posters to present the general guidelines for heart healthy nutrition. Gives an explanation and review of dietary fats and fiber.   Controlling Sodium/Reading Food Labels: -Group verbal and written material supporting the discussion of sodium use in heart healthy nutrition. Review and explanation with models, verbal and written materials for utilization of the food label.   Exercise Physiology & Risk Factors: - Group verbal and written instruction with models to review the exercise physiology of the cardiovascular system and associated critical values. Details cardiovascular disease risk factors and the goals associated with each risk factor.   Aerobic Exercise & Resistance Training: - Gives group verbal and written discussion on the health impact of inactivity. On the components of aerobic and resistive training programs and the benefits of this training and how to safely progress through these programs.   Flexibility, Balance, General Exercise Guidelines: - Provides group verbal and written instruction on the benefits of flexibility and balance training programs. Provides general exercise guidelines with specific guidelines to those with heart or lung disease. Demonstration and skill practice provided.   Stress Management: - Provides group verbal and written instruction about the health risks of elevated stress, cause of high stress, and healthy ways to reduce stress.   Depression: - Provides group verbal and written instruction on the correlation between heart/lung disease and depressed mood, treatment options, and the stigmas associated with seeking treatment.   Exercise & Equipment Safety: - Individual verbal instruction and demonstration of equipment use and safety with use of the equipment.   Infection Prevention: - Provides verbal and written material to  individual with discussion of infection control including proper hand washing and proper equipment cleaning during exercise session.   Pulmonary Rehab from 07/20/2017 in [facility] Cardiac and Pulmonary Rehab  Date  07/20/17  Educator  [facility]  Instruction Review Code  2- meets goals/outcomes      Falls Prevention: - Provides verbal and written material to individual with discussion of falls prevention and safety.   Pulmonary Rehab from 07/20/2017 in [facility] Cardiac and Pulmonary Rehab  Date  07/20/17  Educator  [facility]  Instruction Review Code  2- meets goals/outcomes      Diabetes: -  Individual verbal and written instruction to review signs/symptoms of diabetes, desired ranges of glucose level fasting, after meals and with exercise. Advice that pre and post exercise glucose checks will be done for 3 sessions at entry of program.   Pulmonary Rehab from 07/20/2017 in [facility] Cardiac and Pulmonary Rehab  Date  07/20/17  Educator  [facility]  Instruction Review Code  2- meets goals/outcomes      Chronic Lung Diseases: - Group verbal and written instruction to review new updates, new respiratory medications, new advancements in procedures and treatments. Provide informative websites and "800" numbers of self-education.   Lung Procedures: - Group verbal and written instruction to describe testing methods done to diagnose lung disease. Review the outcome of test results. Describe the treatment choices: Pulmonary Function Tests, ABGs and oximetry.   Energy Conservation: - Provide group verbal and written instruction for methods to conserve energy, plan and organize activities. Instruct on pacing techniques, use of adaptive equipment and posture/positioning to relieve shortness of breath.   Triggers: - Group verbal and written instruction to review types of environmental controls: home humidity, furnaces, filters, dust mite/pet prevention, HEPA vacuums. To discuss weather changes, air quality and the benefits of  nasal washing.   Exacerbations: - Group verbal and written instruction to provide: warning signs, infection symptoms, calling MD promptly, preventive modes, and value of vaccinations. Review: effective airway clearance, coughing and/or vibration techniques. Create an Sports administrator.   Oxygen: - Individual and group verbal and written instruction on oxygen therapy. Includes supplement oxygen, available portable oxygen systems, continuous and intermittent flow rates, oxygen safety, concentrators, and Medicare reimbursement for oxygen.   Respiratory Medications: - Group verbal and written instruction to review medications for lung disease. Drug class, frequency, complications, importance of spacers, rinsing mouth after steroid MDI's, and proper cleaning methods for nebulizers.   AED/CPR: - Group verbal and written instruction with the use of models to demonstrate the basic use of the AED with the basic ABC's of resuscitation.   Breathing Retraining: - Provides individuals verbal and written instruction on purpose, frequency, and proper technique of diaphragmatic breathing and pursed-lipped breathing. Applies individual practice skills.   Anatomy and Physiology of the Lungs: - Group verbal and written instruction with the use of models to provide basic lung anatomy and physiology related to function, structure and complications of lung disease.   Heart Failure: - Group verbal and written instruction on the basics of heart failure: signs/symptoms, treatments, explanation of ejection fraction, enlarged heart and cardiomyopathy.   Sleep Apnea: - Individual verbal and written instruction to review Obstructive Sleep Apnea. Review of risk factors, methods for diagnosing and types of masks and machines for OSA.   Anxiety: - Provides group, verbal and written instruction on the correlation between heart/lung disease and anxiety, treatment options, and management of anxiety.   Relaxation: -  Provides group, verbal and written instruction about the benefits of relaxation for patients with heart/lung disease. Also provides patients with examples of relaxation techniques.   Knowledge Questionnaire Score:     Knowledge Questionnaire Score - 07/20/17 1618      Knowledge Questionnaire Score   Pre Score 8/10  reviewed with pt today       Core Components/Risk Factors/Patient Goals at Admission:     Personal Goals and Risk Factors at Admission - 07/20/17 1653      Core Components/Risk Factors/Patient Goals on Admission    Weight Management Yes   Intervention Weight Management: Develop a combined nutrition and exercise program  designed to reach desired caloric intake, while maintaining appropriate intake of nutrient and fiber, sodium and fats, and appropriate energy expenditure required for the weight goal.;Weight Management: Provide education and appropriate resources to help participant work on and attain dietary goals.;Weight Management/Obesity: Establish reasonable short term and long term weight goals.   Admit Weight 215 lb (97.5 kg)   Goal Weight: Short Term 210 lb (95.3 kg)   Goal Weight: Long Term 150 lb (68 kg)   Expected Outcomes Understanding of distribution of calorie intake throughout the day with the consumption of 4-5 meals/snacks;Understanding recommendations for meals to include 15-35% energy as protein, 25-35% energy from fat, 35-60% energy from carbohydrates, less than 200mg  of dietary cholesterol, 20-35 gm of total fiber daily;Weight Loss: Understanding of general recommendations for a balanced deficit meal plan, which promotes 1-2 lb weight loss per week and includes a negative energy balance of [phone] kcal/d;Weight Maintenance: Understanding of the daily nutrition guidelines, which includes 25-35% calories from fat, 7% or less cal from saturated fats, less than 200mg  cholesterol, less than 1.5gm of sodium, & 5 or more servings of fruits and vegetables daily;Short  Term: Continue to assess and modify interventions until short term weight is achieved   Diabetes Yes   Intervention Provide education about signs/symptoms and action to take for hypo/hyperglycemia.;Provide education about proper nutrition, including hydration, and aerobic/resistive exercise prescription along with prescribed medications to achieve blood glucose in normal ranges: Fasting glucose 65-99 mg/dL   Expected Outcomes Short Term: Participant verbalizes understanding of the signs/symptoms and immediate care of hyper/hypoglycemia, proper foot care and importance of medication, aerobic/resistive exercise and nutrition plan for blood glucose control.;Long Term: Attainment of HbA1C < 7%.   Lipids Yes   Intervention Provide education and support for participant on nutrition & aerobic/resistive exercise along with prescribed medications to achieve LDL 70mg , HDL >40mg .   Expected Outcomes Short Term: Participant states understanding of desired cholesterol values and is compliant with medications prescribed. Participant is following exercise prescription and nutrition guidelines.;Long Term: Cholesterol controlled with medications as prescribed, with individualized exercise RX and with personalized nutrition plan. Value goals: LDL < 70mg , HDL > 40 mg.      Core Components/Risk Factors/Patient Goals Review:    Core Components/Risk Factors/Patient Goals at Discharge (Final Review):    ITP Comments:     ITP Comments    Row Name 07/20/17 1623           ITP Comments Med eval completed.  Documentation for diagnosis can be found in [place] notes that will be sent to medical records.          Comments: Initial ITP

## 2017-07-20 NOTE — Patient Instructions (Signed)
Patient Instructions  Patient Details  Name: Sandra Fischer MRN: [id number] Date of Birth: Apr 13, 1951 Referring Provider:  [place] are the personal goals you chose as well as exercise and nutrition goals. Our goal is to help you keep on track towards obtaining and maintaining your goals. We will be discussing your progress on these goals with you throughout the program.  Initial Exercise Prescription:     Initial Exercise Prescription - 07/20/17 1600      Date of Initial Exercise RX and Referring Provider   Date 07/20/17   Referring Provider [facility]     Treadmill   MPH 1.8   Grade 0   Minutes 15   METs 2.38     NuStep   Level 1   SPM 80   Minutes 15   METs 2     REL-XR   Level 1   Speed 50   Minutes 15   METs 2     Prescription Details   Frequency (times per week) 3   Duration Progress to 45 minutes of aerobic exercise without signs/symptoms of physical distress     Intensity   THRR 40-80% of Max Heartrate 108-141   Ratings of Perceived Exertion 11-13   Perceived Dyspnea 0-4     Progression   Progression Continue to progress workloads to maintain intensity without signs/symptoms of physical distress.     Resistance Training   Training Prescription Yes   Weight 2 lbs   Reps 10-15      Exercise Goals: Frequency: Be able to perform aerobic exercise three times per week working toward 3-5 days per week.  Intensity: Work with a perceived exertion of 11 (fairly light) - 15 (hard) as tolerated. Follow your new exercise prescription and watch for changes in prescription as you progress with the program. Changes will be reviewed with you when they are made.  Duration: You should be able to do 30 minutes of continuous aerobic exercise in addition to a 5 minute warm-up and a 5 minute cool-down routine.  Nutrition Goals: Your personal nutrition goals will be established when you do your nutrition analysis with the dietician.  The  following are nutrition guidelines to follow: Cholesterol < 200mg /day Sodium < 1500mg /day Fiber: Women over 50 yrs - 21 grams per day  Personal Goals:     Personal Goals and Risk Factors at Admission - 07/20/17 1653      Core Components/Risk Factors/Patient Goals on Admission    Weight Management Yes   Intervention Weight Management: Develop a combined nutrition and exercise program designed to reach desired caloric intake, while maintaining appropriate intake of nutrient and fiber, sodium and fats, and appropriate energy expenditure required for the weight goal.;Weight Management: Provide education and appropriate resources to help participant work on and attain dietary goals.;Weight Management/Obesity: Establish reasonable short term and long term weight goals.   Admit Weight 215 lb (97.5 kg)   Goal Weight: Short Term 210 lb (95.3 kg)   Goal Weight: Long Term 150 lb (68 kg)   Expected Outcomes Understanding of distribution of calorie intake throughout the day with the consumption of 4-5 meals/snacks;Understanding recommendations for meals to include 15-35% energy as protein, 25-35% energy from fat, 35-60% energy from carbohydrates, less than 200mg  of dietary cholesterol, 20-35 gm of total fiber daily;Weight Loss: Understanding of general recommendations for a balanced deficit meal plan, which promotes 1-2 lb weight loss per week and includes a negative energy balance of [phone] kcal/d;Weight Maintenance: Understanding  of the daily nutrition guidelines, which includes 25-35% calories from fat, 7% or less cal from saturated fats, less than 200mg  cholesterol, less than 1.5gm of sodium, & 5 or more servings of fruits and vegetables daily;Short Term: Continue to assess and modify interventions until short term weight is achieved   Diabetes Yes   Intervention Provide education about signs/symptoms and action to take for hypo/hyperglycemia.;Provide education about proper nutrition, including hydration,  and aerobic/resistive exercise prescription along with prescribed medications to achieve blood glucose in normal ranges: Fasting glucose 65-99 mg/dL   Expected Outcomes Short Term: Participant verbalizes understanding of the signs/symptoms and immediate care of hyper/hypoglycemia, proper foot care and importance of medication, aerobic/resistive exercise and nutrition plan for blood glucose control.;Long Term: Attainment of HbA1C < 7%.   Lipids Yes   Intervention Provide education and support for participant on nutrition & aerobic/resistive exercise along with prescribed medications to achieve LDL 70mg , HDL >40mg .   Expected Outcomes Short Term: Participant states understanding of desired cholesterol values and is compliant with medications prescribed. Participant is following exercise prescription and nutrition guidelines.;Long Term: Cholesterol controlled with medications as prescribed, with individualized exercise RX and with personalized nutrition plan. Value goals: LDL < 70mg , HDL > 40 mg.      Tobacco Use Initial Evaluation: History  Smoking Status  . Never Smoker  Smokeless Tobacco  . Never Used    Copy of goals given to participant.

## 2017-07-20 NOTE — Progress Notes (Signed)
Daily Session Note  Patient Details  Name: Sandra Fischer MRN: [id number] Date of Birth: [birth year] Referring Provider:     Pulmonary Rehab from 07/20/2017 in [facility]  Referring Provider  [facility]      Encounter Date: 07/20/2017  Check In:     Session Check In - 07/20/17 1616      Check-In   Location [facility]   Staff Present [name], MA, ACSM RCEP, Exercise Physiologist;Adeel Guiffre Flavia Shipper   Supervising physician immediately available to respond to emergencies [facility] immediately available ER MD   Physician(s) Drs. Malinda and Norman   Medication changes reported     No   Fall or balance concerns reported    No   Warm-up and Cool-down Not performed (comment)  walk test and orientation   Resistance Training Performed No   VAD Patient? No     Pain Assessment   Currently in Pain? No/denies   Multiple Pain Sites No         History  Smoking Status  . Never Smoker  Smokeless Tobacco  . Never Used    Goals Met:  Exercise tolerated well Personal goals reviewed Queuing for purse lip breathing  Goals Unmet:  Not Applicable  Comments: Pt able to follow exercise prescription today without complaint.  Will continue to monitor for progression. Medical Evaluation completed today   Dr. Emily Filbert is Medical Director for [facility] and [facility].

## 2017-07-27 ENCOUNTER — Telehealth: Payer: Self-pay

## 2017-07-27 ENCOUNTER — Encounter: Payer: Non-veteran care | Attending: Internal Medicine

## 2017-07-27 DIAGNOSIS — Z7982 Long term (current) use of aspirin: Secondary | ICD-10-CM | POA: Insufficient documentation

## 2017-07-27 DIAGNOSIS — J4550 Severe persistent asthma, uncomplicated: Secondary | ICD-10-CM

## 2017-07-27 DIAGNOSIS — Z7951 Long term (current) use of inhaled steroids: Secondary | ICD-10-CM | POA: Insufficient documentation

## 2017-07-27 DIAGNOSIS — M069 Rheumatoid arthritis, unspecified: Secondary | ICD-10-CM | POA: Insufficient documentation

## 2017-07-27 DIAGNOSIS — I10 Essential (primary) hypertension: Secondary | ICD-10-CM | POA: Insufficient documentation

## 2017-07-27 DIAGNOSIS — Z79899 Other long term (current) drug therapy: Secondary | ICD-10-CM | POA: Insufficient documentation

## 2017-07-27 NOTE — Progress Notes (Signed)
Pulmonary Individual Treatment Plan  Patient Details  Name: Sandra Fischer MRN: [id number] Date of Birth: 11/17/1951 Referring Provider:     Pulmonary Rehab from 07/20/2017 in [facility] Cardiac and Pulmonary Rehab  Referring Provider  [facility]      Initial Encounter Date:    Pulmonary Rehab from 07/20/2017 in [facility] Cardiac and Pulmonary Rehab  Date  07/20/17  Referring Provider  [facility]      Visit Diagnosis: Severe persistent asthma without complication  Patient's Home Medications on Admission:  Current Outpatient Prescriptions:  .  Abatacept 125 MG/ML SOAJ, Take 750 mg by mouth., Disp: , Rfl:  .  albuterol (ACCUNEB) 1.25 MG/3ML nebulizer solution, Take 1 ampule by nebulization every 6 (six) hours as needed for wheezing., Disp: , Rfl:  .  albuterol (PROVENTIL HFA;VENTOLIN HFA) 108 (90 Base) MCG/ACT inhaler, Inhale into the lungs every 6 (six) hours as needed for wheezing or shortness of breath., Disp: , Rfl:  .  albuterol (PROVENTIL HFA;VENTOLIN HFA) 108 (90 Base) MCG/ACT inhaler, Inhale 2 puffs into the lungs every 6 (six) hours as needed for wheezing or shortness of breath., Disp: 1 Inhaler, Rfl: 0 .  aspirin EC 81 MG tablet, Take 81 mg by mouth daily., Disp: , Rfl:  .  azithromycin (ZITHROMAX) 500 MG tablet, Take 1 tablet (500 mg total) by mouth daily., Disp: 4 tablet, Rfl: 0 .  budesonide-formoterol (SYMBICORT) 160-4.5 MCG/ACT inhaler, Inhale 2 puffs into the lungs 2 (two) times daily., Disp: , Rfl:  .  cetirizine (ZYRTEC) 10 MG tablet, Take 10 mg by mouth daily., Disp: , Rfl:  .  desloratadine (CLARINEX) 5 MG tablet, Take 5 mg by mouth daily., Disp: , Rfl:  .  ferrous sulfate 325 (65 FE) MG tablet, Take 325 mg by mouth daily with breakfast., Disp: , Rfl:  .  fluconazole (DIFLUCAN) 150 MG tablet, Take 1 tablet (150 mg total) by mouth once. (Patient not taking: Reported on 08/23/2016), Disp: 2 tablet, Rfl: 1 .  guaiFENesin-dextromethorphan (ROBITUSSIN DM) 100-10 MG/5ML syrup, Take 5  mLs by mouth every 4 (four) hours as needed for cough., Disp: 118 mL, Rfl: 0 .  ipratropium (ATROVENT) 0.02 % nebulizer solution, Take 0.5 mg by nebulization 4 (four) times daily., Disp: , Rfl:  .  levothyroxine (SYNTHROID, LEVOTHROID) 88 MCG tablet, Take 88 mcg by mouth daily before breakfast., Disp: , Rfl:  .  lisinopril (PRINIVIL,ZESTRIL) 40 MG tablet, Take 40 mg by mouth daily., Disp: , Rfl:  .  montelukast (SINGULAIR) 10 MG tablet, Take 10 mg by mouth at bedtime., Disp: , Rfl:  .  omeprazole (PRILOSEC) 10 MG capsule, Take 20 mg by mouth daily., Disp: , Rfl:  .  pravastatin (PRAVACHOL) 40 MG tablet, Take 40 mg by mouth daily., Disp: , Rfl:  .  predniSONE (DELTASONE) 10 MG tablet, Take 1 tablet (10 mg total) by mouth daily., Disp: 42 tablet, Rfl: 0 .  Prenatal Vit-Fe Fumarate-FA (MULTIVITAMIN-PRENATAL) 27-0.8 MG TABS tablet, Take 1 tablet by mouth daily at 12 noon., Disp: , Rfl:  .  sulfamethoxazole-trimethoprim (BACTRIM DS,SEPTRA DS) 800-160 MG tablet, Take 1 tablet by mouth 2 (two) times daily., Disp: 14 tablet, Rfl: 0 .  traMADol (ULTRAM) 50 MG tablet, Take 100 mg by mouth as needed (for rheumatoid arthritis)., Disp: , Rfl:  .  verapamil (CALAN) 120 MG tablet, Take 240 mg by mouth 2 (two) times daily., Disp: , Rfl:   Past Medical History: Past Medical History:  Diagnosis Date  . Asthma   .  Hypertension   . RA (rheumatoid arthritis) (HCC)     Tobacco Use: History  Smoking Status  . Never Smoker  Smokeless Tobacco  . Never Used    Labs: Recent Review Flowsheet Data    There is no flowsheet data to display.       ADL UCSD:     Pulmonary Assessment Scores    Row Name 07/20/17 1619         ADL UCSD   ADL Phase Entry     SOB Score total 40     Rest 1     Walk 2     Stairs 4     Bath 1     Dress 2     Shop 3       mMRC Score   mMRC Score 4        Pulmonary Function Assessment:     Pulmonary Function Assessment - 07/20/17 1657      Pulmonary Function  Tests   FVC% 62 %   FEV1% 39 %   FEV1/FVC Ratio 50      Exercise Target Goals:    Exercise Program Goal: Individual exercise prescription set with THRR, safety & activity barriers. Participant demonstrates ability to understand and report RPE using BORG scale, to self-measure pulse accurately, and to acknowledge the importance of the exercise prescription.  Exercise Prescription Goal: Starting with aerobic activity 30 plus minutes a day, 3 days per week for initial exercise prescription. Provide home exercise prescription and guidelines that participant acknowledges understanding prior to discharge.  Activity Barriers & Risk Stratification:     Activity Barriers & Cardiac Risk Stratification - 07/20/17 1613      Activity Barriers & Cardiac Risk Stratification   Activity Barriers Arthritis;Deconditioning;Muscular Weakness;Shortness of Breath;Joint Problems  Rhuematoid Arthritis, joints swell (especially ankles, hips, and knees0      6 Minute Walk:     6 Minute Walk    Row Name 07/20/17 1611         6 Minute Walk   Phase Initial     Distance 1122 feet     Walk Time 6 minutes     # of Rest Breaks 0     MPH 2.13     METS 2.4     RPE 13     Perceived Dyspnea  3     VO2 Peak 8.41     Symptoms Yes (comment)     Comments SOB, chest tightness with breathing heavier     Resting HR 75 bpm     Resting BP 138/64     Max Ex. HR 105 bpm     Max Ex. BP 134/56     2 Minute Post BP 130/60       Interval HR   Baseline HR 75     3 Minute HR 105     6 Minute HR 105     2 Minute Post HR 75     Interval Heart Rate? Yes  pulse oximeter did not pick up heart rate very well       Interval Oxygen   Interval Oxygen? Yes     Baseline Oxygen Saturation % 95 %     Baseline Liters of Oxygen 0 L  Room Air     1 Minute Oxygen Saturation % 94 %     1 Minute Liters of Oxygen 0 L     2 Minute Oxygen Saturation % 93 %     2  Minute Liters of Oxygen 0 L     3 Minute Oxygen Saturation %  95 %     3 Minute Liters of Oxygen 0 L     4 Minute Oxygen Saturation % 95 %     4 Minute Liters of Oxygen 0 L     5 Minute Oxygen Saturation % 96 %     5 Minute Liters of Oxygen 0 L     6 Minute Oxygen Saturation % 96 %     6 Minute Liters of Oxygen 0 L     2 Minute Post Oxygen Saturation % 98 %     2 Minute Post Liters of Oxygen 0 L       Oxygen Initial Assessment:     Oxygen Initial Assessment - 07/20/17 1655      Home Oxygen   Home Oxygen Device None   Sleep Oxygen Prescription None   Home Exercise Oxygen Prescription None   Home at Rest Exercise Oxygen Prescription None     Initial 6 min Walk   Oxygen Used None     Intervention   Short Term Goals To learn and understand importance of monitoring SPO2 with pulse oximeter and demonstrate accurate use of the pulse oximeter.;To Learn and understand importance of maintaining oxygen saturations>88%;To learn and demonstrate proper purse lipped breathing techniques or other breathing techniques.;To learn and demonstrate proper use of respiratory medications   Long  Term Goals Maintenance of O2 saturations>88%;Compliance with respiratory medication;Demonstrates proper use of MDI's;Exhibits proper breathing techniques, such as purse lipped breathing or other method taught during program session;Verbalizes importance of monitoring SPO2 with pulse oximeter and return demonstration      Oxygen Re-Evaluation:   Oxygen Discharge (Final Oxygen Re-Evaluation):   Initial Exercise Prescription:     Initial Exercise Prescription - 07/20/17 1600      Date of Initial Exercise RX and Referring Provider   Date 07/20/17   Referring Provider [facility]     Treadmill   MPH 1.8   Grade 0   Minutes 15   METs 2.38     NuStep   Level 1   SPM 80   Minutes 15   METs 2     REL-XR   Level 1   Speed 50   Minutes 15   METs 2     Prescription Details   Frequency (times per week) 3   Duration Progress to 45 minutes of aerobic  exercise without signs/symptoms of physical distress     Intensity   THRR 40-80% of Max Heartrate 108-141   Ratings of Perceived Exertion 11-13   Perceived Dyspnea 0-4     Progression   Progression Continue to progress workloads to maintain intensity without signs/symptoms of physical distress.     Resistance Training   Training Prescription Yes   Weight 2 lbs   Reps 10-15      Perform Capillary Blood Glucose checks as needed.  Exercise Prescription Changes:   Exercise Comments:   Exercise Goals and Review:     Exercise Goals    Row Name 07/20/17 1615             Exercise Goals   Increase Physical Activity Yes       Intervention Provide advice, education, support and counseling about physical activity/exercise needs.;Develop an individualized exercise prescription for aerobic and resistive training based on initial evaluation findings, risk stratification, comorbidities and participant's personal goals.       Expected Outcomes Achievement  of increased cardiorespiratory fitness and enhanced flexibility, muscular endurance and strength shown through measurements of functional capacity and personal statement of participant.       Increase Strength and Stamina Yes       Intervention Provide advice, education, support and counseling about physical activity/exercise needs.;Develop an individualized exercise prescription for aerobic and resistive training based on initial evaluation findings, risk stratification, comorbidities and participant's personal goals.       Expected Outcomes Achievement of increased cardiorespiratory fitness and enhanced flexibility, muscular endurance and strength shown through measurements of functional capacity and personal statement of participant.          Exercise Goals Re-Evaluation :   Discharge Exercise Prescription (Final Exercise Prescription Changes):   Nutrition:  Target Goals: Understanding of nutrition guidelines, daily intake of  sodium 1500mg , cholesterol 200mg , calories 30% from fat and 7% or less from saturated fats, daily to have 5 or more servings of fruits and vegetables.  Biometrics:     Pre Biometrics - 07/20/17 1615      Pre Biometrics   Height 5' 2.2" (1.58 m)   Weight 211 lb 3.2 oz (95.8 kg)   Waist Circumference 41 inches   Hip Circumference 43.5 inches   Waist to Hip Ratio 0.94 %   BMI (Calculated) 38.5       Nutrition Therapy Plan and Nutrition Goals:     Nutrition Therapy & Goals - 07/20/17 1659      Intervention Plan   Intervention Prescribe, educate and counsel regarding individualized specific dietary modifications aiming towards targeted core components such as weight, hypertension, lipid management, diabetes, heart failure and other comorbidities.;Nutrition handout(s) given to patient.   Expected Outcomes Short Term Goal: Understand basic principles of dietary content, such as calories, fat, sodium, cholesterol and nutrients.;Short Term Goal: A plan has been developed with personal nutrition goals set during dietitian appointment.;Long Term Goal: Adherence to prescribed nutrition plan.      Nutrition Discharge: Rate Your Plate Scores:   Nutrition Goals Re-Evaluation:   Nutrition Goals Discharge (Final Nutrition Goals Re-Evaluation):   Psychosocial: Target Goals: Acknowledge presence or absence of significant depression and/or stress, maximize coping skills, provide positive support system. Participant is able to verbalize types and ability to use techniques and skills needed for reducing stress and depression.   Initial Review & Psychosocial Screening:     Initial Psych Review & Screening - 07/20/17 1700      Initial Review   Current issues with None Identified     Family Dynamics   Good Support System? Yes     Barriers   Psychosocial barriers to participate in program The patient should benefit from training in stress management and relaxation.     Screening  Interventions   Interventions Program counselor consult      Quality of Life Scores:     Quality of Life - 07/20/17 1620      Quality of Life Scores   Health/Function Pre 15.25 %   Socioeconomic Pre 19.36 %   Psych/Spiritual Pre 19.79 %   Family Pre 21.25 %   GLOBAL Pre 17.74 %      PHQ-9: Recent Review Flowsheet Data    Depression screen [facility] 2/9 07/20/2017   Decreased Interest 1   Down, Depressed, Hopeless 0   PHQ - 2 Score 1   Altered sleeping 2   Tired, decreased energy 3   Change in appetite 2   Feeling bad or failure about yourself  1   Trouble  concentrating 3   Moving slowly or fidgety/restless 0   Suicidal thoughts 0   PHQ-9 Score 12   Difficult doing work/chores Somewhat difficult     Interpretation of Total Score  Total Score Depression Severity:  1-4 = Minimal depression, 5-9 = Mild depression, 10-14 = Moderate depression, 15-19 = Moderately severe depression, 20-27 = Severe depression   Psychosocial Evaluation and Intervention:   Psychosocial Re-Evaluation:   Psychosocial Discharge (Final Psychosocial Re-Evaluation):   Education: Education Goals: Education classes will be provided on a weekly basis, covering required topics. Participant will state understanding/return demonstration of topics presented.  Learning Barriers/Preferences:     Learning Barriers/Preferences - 07/20/17 1656      Learning Barriers/Preferences   Learning Barriers None   Learning Preferences None      Education Topics: Initial Evaluation Education: - Verbal, written and demonstration of respiratory meds, RPE/PD scales, oximetry and breathing techniques. Instruction on use of nebulizers and MDIs: cleaning and proper use, rinsing mouth with steroid doses and importance of monitoring MDI activations.   Pulmonary Rehab from 07/20/2017 in [facility] Cardiac and Pulmonary Rehab  Date  07/20/17  Educator  [facility]  Instruction Review Code  2- meets goals/outcomes      General  Nutrition Guidelines/Fats and Fiber: -Group instruction provided by verbal, written material, models and posters to present the general guidelines for heart healthy nutrition. Gives an explanation and review of dietary fats and fiber.   Controlling Sodium/Reading Food Labels: -Group verbal and written material supporting the discussion of sodium use in heart healthy nutrition. Review and explanation with models, verbal and written materials for utilization of the food label.   Exercise Physiology & Risk Factors: - Group verbal and written instruction with models to review the exercise physiology of the cardiovascular system and associated critical values. Details cardiovascular disease risk factors and the goals associated with each risk factor.   Aerobic Exercise & Resistance Training: - Gives group verbal and written discussion on the health impact of inactivity. On the components of aerobic and resistive training programs and the benefits of this training and how to safely progress through these programs.   Flexibility, Balance, General Exercise Guidelines: - Provides group verbal and written instruction on the benefits of flexibility and balance training programs. Provides general exercise guidelines with specific guidelines to those with heart or lung disease. Demonstration and skill practice provided.   Stress Management: - Provides group verbal and written instruction about the health risks of elevated stress, cause of high stress, and healthy ways to reduce stress.   Depression: - Provides group verbal and written instruction on the correlation between heart/lung disease and depressed mood, treatment options, and the stigmas associated with seeking treatment.   Exercise & Equipment Safety: - Individual verbal instruction and demonstration of equipment use and safety with use of the equipment.   Infection Prevention: - Provides verbal and written material to individual with  discussion of infection control including proper hand washing and proper equipment cleaning during exercise session.   Pulmonary Rehab from 07/20/2017 in [facility] Cardiac and Pulmonary Rehab  Date  07/20/17  Educator  [facility]  Instruction Review Code  2- meets goals/outcomes      Falls Prevention: - Provides verbal and written material to individual with discussion of falls prevention and safety.   Pulmonary Rehab from 07/20/2017 in [facility] Cardiac and Pulmonary Rehab  Date  07/20/17  Educator  [facility]  Instruction Review Code  2- meets goals/outcomes      Diabetes: -  Individual verbal and written instruction to review signs/symptoms of diabetes, desired ranges of glucose level fasting, after meals and with exercise. Advice that pre and post exercise glucose checks will be done for 3 sessions at entry of program.   Pulmonary Rehab from 07/20/2017 in [facility] Cardiac and Pulmonary Rehab  Date  07/20/17  Educator  [facility]  Instruction Review Code  2- meets goals/outcomes      Chronic Lung Diseases: - Group verbal and written instruction to review new updates, new respiratory medications, new advancements in procedures and treatments. Provide informative websites and "800" numbers of self-education.   Lung Procedures: - Group verbal and written instruction to describe testing methods done to diagnose lung disease. Review the outcome of test results. Describe the treatment choices: Pulmonary Function Tests, ABGs and oximetry.   Energy Conservation: - Provide group verbal and written instruction for methods to conserve energy, plan and organize activities. Instruct on pacing techniques, use of adaptive equipment and posture/positioning to relieve shortness of breath.   Triggers: - Group verbal and written instruction to review types of environmental controls: home humidity, furnaces, filters, dust mite/pet prevention, HEPA vacuums. To discuss weather changes, air quality and the benefits of nasal  washing.   Exacerbations: - Group verbal and written instruction to provide: warning signs, infection symptoms, calling MD promptly, preventive modes, and value of vaccinations. Review: effective airway clearance, coughing and/or vibration techniques. Create an Sports administrator.   Oxygen: - Individual and group verbal and written instruction on oxygen therapy. Includes supplement oxygen, available portable oxygen systems, continuous and intermittent flow rates, oxygen safety, concentrators, and Medicare reimbursement for oxygen.   Respiratory Medications: - Group verbal and written instruction to review medications for lung disease. Drug class, frequency, complications, importance of spacers, rinsing mouth after steroid MDI's, and proper cleaning methods for nebulizers.   AED/CPR: - Group verbal and written instruction with the use of models to demonstrate the basic use of the AED with the basic ABC's of resuscitation.   Breathing Retraining: - Provides individuals verbal and written instruction on purpose, frequency, and proper technique of diaphragmatic breathing and pursed-lipped breathing. Applies individual practice skills.   Anatomy and Physiology of the Lungs: - Group verbal and written instruction with the use of models to provide basic lung anatomy and physiology related to function, structure and complications of lung disease.   Heart Failure: - Group verbal and written instruction on the basics of heart failure: signs/symptoms, treatments, explanation of ejection fraction, enlarged heart and cardiomyopathy.   Sleep Apnea: - Individual verbal and written instruction to review Obstructive Sleep Apnea. Review of risk factors, methods for diagnosing and types of masks and machines for OSA.   Anxiety: - Provides group, verbal and written instruction on the correlation between heart/lung disease and anxiety, treatment options, and management of anxiety.   Relaxation: -  Provides group, verbal and written instruction about the benefits of relaxation for patients with heart/lung disease. Also provides patients with examples of relaxation techniques.   Knowledge Questionnaire Score:     Knowledge Questionnaire Score - 07/20/17 1618      Knowledge Questionnaire Score   Pre Score 8/10  reviewed with pt today       Core Components/Risk Factors/Patient Goals at Admission:     Personal Goals and Risk Factors at Admission - 07/20/17 1653      Core Components/Risk Factors/Patient Goals on Admission    Weight Management Yes   Intervention Weight Management: Develop a combined nutrition and exercise program  designed to reach desired caloric intake, while maintaining appropriate intake of nutrient and fiber, sodium and fats, and appropriate energy expenditure required for the weight goal.;Weight Management: Provide education and appropriate resources to help participant work on and attain dietary goals.;Weight Management/Obesity: Establish reasonable short term and long term weight goals.   Admit Weight 215 lb (97.5 kg)   Goal Weight: Short Term 210 lb (95.3 kg)   Goal Weight: Long Term 150 lb (68 kg)   Expected Outcomes Understanding of distribution of calorie intake throughout the day with the consumption of 4-5 meals/snacks;Understanding recommendations for meals to include 15-35% energy as protein, 25-35% energy from fat, 35-60% energy from carbohydrates, less than 200mg  of dietary cholesterol, 20-35 gm of total fiber daily;Weight Loss: Understanding of general recommendations for a balanced deficit meal plan, which promotes 1-2 lb weight loss per week and includes a negative energy balance of [phone] kcal/d;Weight Maintenance: Understanding of the daily nutrition guidelines, which includes 25-35% calories from fat, 7% or less cal from saturated fats, less than 200mg  cholesterol, less than 1.5gm of sodium, & 5 or more servings of fruits and vegetables daily;Short  Term: Continue to assess and modify interventions until short term weight is achieved   Diabetes Yes   Intervention Provide education about signs/symptoms and action to take for hypo/hyperglycemia.;Provide education about proper nutrition, including hydration, and aerobic/resistive exercise prescription along with prescribed medications to achieve blood glucose in normal ranges: Fasting glucose 65-99 mg/dL   Expected Outcomes Short Term: Participant verbalizes understanding of the signs/symptoms and immediate care of hyper/hypoglycemia, proper foot care and importance of medication, aerobic/resistive exercise and nutrition plan for blood glucose control.;Long Term: Attainment of HbA1C < 7%.   Lipids Yes   Intervention Provide education and support for participant on nutrition & aerobic/resistive exercise along with prescribed medications to achieve LDL 70mg , HDL >40mg .   Expected Outcomes Short Term: Participant states understanding of desired cholesterol values and is compliant with medications prescribed. Participant is following exercise prescription and nutrition guidelines.;Long Term: Cholesterol controlled with medications as prescribed, with individualized exercise RX and with personalized nutrition plan. Value goals: LDL < 70mg , HDL > 40 mg.      Core Components/Risk Factors/Patient Goals Review:    Core Components/Risk Factors/Patient Goals at Discharge (Final Review):    ITP Comments:     ITP Comments    Row Name 07/20/17 1623 07/27/17 0845         ITP Comments Med eval completed.  Documentation for diagnosis can be found in [place] notes that will be sent to medical records. 30 day review completed ITP sent to Dr. Ramonita Lab for Dr. Emily Filbert Director of [place]. Continue with ITP unless changes are made by physician. Patient has not yet had first day of exercise          Comments:

## 2017-07-27 NOTE — Telephone Encounter (Signed)
Sandra Fischer will not be attending class this morning due to an arthritis flare up. She states she will try to be in class on Wednesday.

## 2017-07-28 ENCOUNTER — Telehealth: Payer: Self-pay

## 2017-07-28 NOTE — Telephone Encounter (Signed)
Sandra Fischer called to say she cannot start until Monday August 20th due to her mom needing a sitter while her husband is away.

## 2017-07-29 ENCOUNTER — Encounter: Payer: Non-veteran care | Attending: Internal Medicine

## 2017-07-29 DIAGNOSIS — M069 Rheumatoid arthritis, unspecified: Secondary | ICD-10-CM | POA: Insufficient documentation

## 2017-07-29 DIAGNOSIS — Z79899 Other long term (current) drug therapy: Secondary | ICD-10-CM | POA: Insufficient documentation

## 2017-07-29 DIAGNOSIS — Z7951 Long term (current) use of inhaled steroids: Secondary | ICD-10-CM | POA: Insufficient documentation

## 2017-07-29 DIAGNOSIS — J4550 Severe persistent asthma, uncomplicated: Secondary | ICD-10-CM | POA: Insufficient documentation

## 2017-07-29 DIAGNOSIS — Z7982 Long term (current) use of aspirin: Secondary | ICD-10-CM | POA: Insufficient documentation

## 2017-07-29 DIAGNOSIS — I10 Essential (primary) hypertension: Secondary | ICD-10-CM | POA: Insufficient documentation

## 2017-08-17 DIAGNOSIS — J4550 Severe persistent asthma, uncomplicated: Secondary | ICD-10-CM

## 2017-08-17 DIAGNOSIS — I10 Essential (primary) hypertension: Secondary | ICD-10-CM | POA: Diagnosis not present

## 2017-08-17 DIAGNOSIS — M069 Rheumatoid arthritis, unspecified: Secondary | ICD-10-CM | POA: Diagnosis not present

## 2017-08-17 DIAGNOSIS — Z7951 Long term (current) use of inhaled steroids: Secondary | ICD-10-CM | POA: Diagnosis not present

## 2017-08-17 DIAGNOSIS — Z79899 Other long term (current) drug therapy: Secondary | ICD-10-CM | POA: Diagnosis not present

## 2017-08-17 DIAGNOSIS — Z7982 Long term (current) use of aspirin: Secondary | ICD-10-CM | POA: Diagnosis not present

## 2017-08-17 LAB — GLUCOSE, CAPILLARY
Glucose-Capillary: 159 mg/dL — ABNORMAL HIGH (ref 65–99)
Glucose-Capillary: 123 mg/dL — ABNORMAL HIGH (ref 65–99)

## 2017-08-17 NOTE — Progress Notes (Signed)
Daily Session Note  Patient Details  Name: Sandra Fischer MRN: [id number] Date of Birth: 05/02/1951 Referring Provider:     Pulmonary Rehab from 07/20/2017 in [facility]  Referring Provider  [facility]      Encounter Date: 08/17/2017  Check In:     Session Check In - 08/17/17 1034      Check-In   Location [facility]   Staff Present Gerlene Burdock, RN, Moises Blood, BS, ACSM CEP, Exercise Physiologist;Saamir Armstrong Flavia Shipper   Supervising physician immediately available to respond to emergencies [facility] immediately available ER MD   Physician(s) Dr. Mable Paris and Jimmye Norman   Medication changes reported     No   Fall or balance concerns reported    No   Warm-up and Cool-down Performed as group-led instruction   Resistance Training Performed Yes   VAD Patient? No     Pain Assessment   Currently in Pain? No/denies   Multiple Pain Sites No         History  Smoking Status  . Never Smoker  Smokeless Tobacco  . Never Used    Goals Met:  Exercise tolerated well No report of cardiac concerns or symptoms Strength training completed today  Goals Unmet:  Not Applicable  Comments: First full day of exercise!  Patient was oriented to gym and equipment including functions, settings, policies, and procedures.  Patient's individual exercise prescription and treatment plan were reviewed.  All starting workloads were established based on the results of the 6 minute walk test done at initial orientation visit.  The plan for exercise progression was also introduced and progression will be customized based on patient's performance and goals.   Dr. Emily Filbert is Medical Director for [place] and [facility].

## 2017-08-17 NOTE — Progress Notes (Signed)
Daily Session Note  Patient Details  Name: Sandra Fischer MRN: [id number] Date of Birth: [birth year] Referring Provider:     Pulmonary Rehab from 07/20/2017 in [place] and Pul[facility] Rehab  Referring Provider  [facility]      Encounter Date: 08/17/2017  Check In:     Session Check In - 08/17/17 1126      Check-In   Location [facility]   Staff Present Gerlene Burdock, RN, BSN;Joseph Hood RCP,RRT,BSRT;Keevan Wolz [place]ades, [place], ACSM CEP, Exercise Physiologist   Supervising physician immediately available to respond to emergencies [facility] immediately available ER MD   Physician(s) Drs. Rifenbark and Williams   Medication changes reported     No   Fall or balance concerns reported    No   Warm-up and Cool-down Performed as group-led Location manager Performed Yes   VAD Patient? No     Pain Assessment   Currently in Pain? No/denies   Multiple Pain Sites No         History  Smoking Status  . Never Smoker  Smokeless Tobacco  . Never Used    Goals Met:  Proper associated with RPD/PD & O2 Sat Exercise tolerated well Personal goals reviewed Queuing for purse lip breathing No report of cardiac concerns or symptoms Strength training completed today  Goals Unmet:  Not Applicable  Comments: First full day of exercise!  Patient was oriented to gym and equipment including functions, settings, policies, and procedures.  Patient's individual exercise prescription and treatment plan were reviewed.  All starting workloads were established based on the results of the 6 minute walk test done at initial orientation visit.  The plan for exercise progression was also introduced and progression will be customized based on patient's performance and goals.    Dr. Emily Filbert is Medical Director for [place] and [facility].

## 2017-08-19 DIAGNOSIS — J4550 Severe persistent asthma, uncomplicated: Secondary | ICD-10-CM | POA: Diagnosis not present

## 2017-08-19 LAB — GLUCOSE, CAPILLARY
Glucose-Capillary: 165 mg/dL — ABNORMAL HIGH (ref 65–99)
Glucose-Capillary: 87 mg/dL (ref 65–99)

## 2017-08-19 NOTE — Progress Notes (Signed)
Daily Session Note  Patient Details  Name: Sandra Fischer MRN: [id number] Date of Birth: [birth year] Referring Provider:     Pulmonary Rehab from 07/20/2017 in [place] and Pulmonary Rehab  Referring Provider  [facility]      Encounter Date: 08/19/2017  Check In:     Session Check In - 08/19/17 1026      Check-In   Location [facility]   Staff Present [name], MA, ACSM RCEP, Exercise Physiologist;Amanda Oletta Darter, BA, ACSM CEP, Exercise Physiologist;Jomarion Mish Flavia Shipper   Supervising physician immediately available to respond to emergencies [facility] immediately available ER MD   Physician(s) Dr. Jimmye Norman and Mariea Clonts   Medication changes reported     No   Fall or balance concerns reported    No   Warm-up and Cool-down Performed as group-led instruction   Resistance Training Performed Yes   VAD Patient? No     Pain Assessment   Currently in Pain? No/denies   Multiple Pain Sites No         History  Smoking Status  . Never Smoker  Smokeless Tobacco  . Never Used    Goals Met:  Exercise tolerated well No report of cardiac concerns or symptoms Strength training completed today  Goals Unmet:  Not Applicable  Comments: Pt able to follow exercise prescription today without complaint.  Will continue to monitor for progression.   Dr. Emily Filbert is Medical Director for [place] and [facility].

## 2017-08-24 ENCOUNTER — Telehealth: Payer: Self-pay

## 2017-08-24 DIAGNOSIS — J4550 Severe persistent asthma, uncomplicated: Secondary | ICD-10-CM

## 2017-08-24 NOTE — Telephone Encounter (Signed)
Sandra Fischer called to inform us that she is out today due to her mother being sick. She was out last week due to not feeling well. She hopes to be back Wednesday or Friday.

## 2017-08-24 NOTE — Progress Notes (Signed)
Pulmonary Individual Treatment Plan  Patient Details  Name: Cathryn Gallery MRN: [id number] Date of Birth: 11/17/1951 Referring Provider:     Pulmonary Rehab from 07/20/2017 in [facility] Cardiac and Pulmonary Rehab  Referring Provider  [facility]      Initial Encounter Date:    Pulmonary Rehab from 07/20/2017 in [facility] Cardiac and Pulmonary Rehab  Date  07/20/17  Referring Provider  [facility]      Visit Diagnosis: Severe persistent asthma without complication  Patient's Home Medications on Admission:  Current Outpatient Prescriptions:  .  Abatacept 125 MG/ML SOAJ, Take 750 mg by mouth., Disp: , Rfl:  .  albuterol (ACCUNEB) 1.25 MG/3ML nebulizer solution, Take 1 ampule by nebulization every 6 (six) hours as needed for wheezing., Disp: , Rfl:  .  albuterol (PROVENTIL HFA;VENTOLIN HFA) 108 (90 Base) MCG/ACT inhaler, Inhale into the lungs every 6 (six) hours as needed for wheezing or shortness of breath., Disp: , Rfl:  .  albuterol (PROVENTIL HFA;VENTOLIN HFA) 108 (90 Base) MCG/ACT inhaler, Inhale 2 puffs into the lungs every 6 (six) hours as needed for wheezing or shortness of breath., Disp: 1 Inhaler, Rfl: 0 .  aspirin EC 81 MG tablet, Take 81 mg by mouth daily., Disp: , Rfl:  .  azithromycin (ZITHROMAX) 500 MG tablet, Take 1 tablet (500 mg total) by mouth daily., Disp: 4 tablet, Rfl: 0 .  budesonide-formoterol (SYMBICORT) 160-4.5 MCG/ACT inhaler, Inhale 2 puffs into the lungs 2 (two) times daily., Disp: , Rfl:  .  cetirizine (ZYRTEC) 10 MG tablet, Take 10 mg by mouth daily., Disp: , Rfl:  .  desloratadine (CLARINEX) 5 MG tablet, Take 5 mg by mouth daily., Disp: , Rfl:  .  ferrous sulfate 325 (65 FE) MG tablet, Take 325 mg by mouth daily with breakfast., Disp: , Rfl:  .  fluconazole (DIFLUCAN) 150 MG tablet, Take 1 tablet (150 mg total) by mouth once. (Patient not taking: Reported on 08/23/2016), Disp: 2 tablet, Rfl: 1 .  guaiFENesin-dextromethorphan (ROBITUSSIN DM) 100-10 MG/5ML syrup, Take 5  mLs by mouth every 4 (four) hours as needed for cough., Disp: 118 mL, Rfl: 0 .  ipratropium (ATROVENT) 0.02 % nebulizer solution, Take 0.5 mg by nebulization 4 (four) times daily., Disp: , Rfl:  .  levothyroxine (SYNTHROID, LEVOTHROID) 88 MCG tablet, Take 88 mcg by mouth daily before breakfast., Disp: , Rfl:  .  lisinopril (PRINIVIL,ZESTRIL) 40 MG tablet, Take 40 mg by mouth daily., Disp: , Rfl:  .  montelukast (SINGULAIR) 10 MG tablet, Take 10 mg by mouth at bedtime., Disp: , Rfl:  .  omeprazole (PRILOSEC) 10 MG capsule, Take 20 mg by mouth daily., Disp: , Rfl:  .  pravastatin (PRAVACHOL) 40 MG tablet, Take 40 mg by mouth daily., Disp: , Rfl:  .  predniSONE (DELTASONE) 10 MG tablet, Take 1 tablet (10 mg total) by mouth daily., Disp: 42 tablet, Rfl: 0 .  Prenatal Vit-Fe Fumarate-FA (MULTIVITAMIN-PRENATAL) 27-0.8 MG TABS tablet, Take 1 tablet by mouth daily at 12 noon., Disp: , Rfl:  .  sulfamethoxazole-trimethoprim (BACTRIM DS,SEPTRA DS) 800-160 MG tablet, Take 1 tablet by mouth 2 (two) times daily., Disp: 14 tablet, Rfl: 0 .  traMADol (ULTRAM) 50 MG tablet, Take 100 mg by mouth as needed (for rheumatoid arthritis)., Disp: , Rfl:  .  verapamil (CALAN) 120 MG tablet, Take 240 mg by mouth 2 (two) times daily., Disp: , Rfl:   Past Medical History: Past Medical History:  Diagnosis Date  . Asthma   .  Hypertension   . RA (rheumatoid arthritis) (HCC)     Tobacco Use: History  Smoking Status  . Never Smoker  Smokeless Tobacco  . Never Used    Labs: Recent Review Flowsheet Data    There is no flowsheet data to display.       Pulmonary Assessment Scores:     Pulmonary Assessment Scores    Row Name 07/20/17 1619         ADL UCSD   ADL Phase Entry     SOB Score total 40     Rest 1     Walk 2     Stairs 4     Bath 1     Dress 2     Shop 3       mMRC Score   mMRC Score 4        Pulmonary Function Assessment:     Pulmonary Function Assessment - 07/20/17 1657       Pulmonary Function Tests   FVC% 62 %   FEV1% 39 %   FEV1/FVC Ratio 50      Exercise Target Goals:    Exercise Program Goal: Individual exercise prescription set with THRR, safety & activity barriers. Participant demonstrates ability to understand and report RPE using BORG scale, to self-measure pulse accurately, and to acknowledge the importance of the exercise prescription.  Exercise Prescription Goal: Starting with aerobic activity 30 plus minutes a day, 3 days per week for initial exercise prescription. Provide home exercise prescription and guidelines that participant acknowledges understanding prior to discharge.  Activity Barriers & Risk Stratification:     Activity Barriers & Cardiac Risk Stratification - 07/20/17 1613      Activity Barriers & Cardiac Risk Stratification   Activity Barriers Arthritis;Deconditioning;Muscular Weakness;Shortness of Breath;Joint Problems  Rhuematoid Arthritis, joints swell (especially ankles, hips, and knees0      6 Minute Walk:     6 Minute Walk    Row Name 07/20/17 1611         6 Minute Walk   Phase Initial     Distance 1122 feet     Walk Time 6 minutes     # of Rest Breaks 0     MPH 2.13     METS 2.4     RPE 13     Perceived Dyspnea  3     VO2 Peak 8.41     Symptoms Yes (comment)     Comments SOB, chest tightness with breathing heavier     Resting HR 75 bpm     Resting BP 138/64     Max Ex. HR 105 bpm     Max Ex. BP 134/56     2 Minute Post BP 130/60       Interval HR   Baseline HR (retired) 75     3 Minute HR 105     6 Minute HR 105     2 Minute Post HR 75     Interval Heart Rate? Yes  pulse oximeter did not pick up heart rate very well       Interval Oxygen   Interval Oxygen? Yes     Baseline Oxygen Saturation % 95 %     Resting Liters of Oxygen 0 L  Room Air     1 Minute Oxygen Saturation % 94 %     1 Minute Liters of Oxygen 0 L     2 Minute Oxygen Saturation % 93 %  2 Minute Liters of Oxygen 0 L     3  Minute Oxygen Saturation % 95 %     3 Minute Liters of Oxygen 0 L     4 Minute Oxygen Saturation % 95 %     4 Minute Liters of Oxygen 0 L     5 Minute Oxygen Saturation % 96 %     5 Minute Liters of Oxygen 0 L     6 Minute Oxygen Saturation % 96 %     6 Minute Liters of Oxygen 0 L     2 Minute Post Oxygen Saturation % 98 %     2 Minute Post Liters of Oxygen 0 L       Oxygen Initial Assessment:     Oxygen Initial Assessment - 07/20/17 1655      Home Oxygen   Home Oxygen Device None   Sleep Oxygen Prescription None   Home Exercise Oxygen Prescription None   Home at Rest Exercise Oxygen Prescription None     Initial 6 min Walk   Oxygen Used None     Intervention   Short Term Goals To learn and understand importance of monitoring SPO2 with pulse oximeter and demonstrate accurate use of the pulse oximeter.;To Learn and understand importance of maintaining oxygen saturations>88%;To learn and demonstrate proper purse lipped breathing techniques or other breathing techniques.;To learn and demonstrate proper use of respiratory medications   Long  Term Goals Maintenance of O2 saturations>88%;Compliance with respiratory medication;Demonstrates proper use of MDI's;Exhibits proper breathing techniques, such as purse lipped breathing or other method taught during program session;Verbalizes importance of monitoring SPO2 with pulse oximeter and return demonstration      Oxygen Re-Evaluation:     Oxygen Re-Evaluation    Row Name 08/17/17 1131             Goals/Expected Outcomes   Short Term Goals To learn and understand importance of monitoring SPO2 with pulse oximeter and demonstrate accurate use of the pulse oximeter.;To learn and demonstrate proper purse lipped breathing techniques or other breathing techniques.;To Learn and understand importance of maintaining oxygen saturations>88%;To learn and demonstrate proper use of respiratory medications;To learn and exhibit compliance with  exercise, home and travel O2 prescription       Long  Term Goals Exhibits proper breathing techniques, such as purse lipped breathing or other method taught during program session;Maintenance of O2 saturations>88%;Compliance with respiratory medication;Exhibits compliance with exercise, home and travel O2 prescription;Verbalizes importance of monitoring SPO2 with pulse oximeter and return demonstration       Comments On patient's first day of exercise pursed lip breathing was discussed and demonstrated as well as instruction on using the pulse oximeter and proper oxygen saturations during rest and exercise. Patient demonstrated understanding of these concepts.        Goals/Expected Outcomes Short: Patient will use PLB techniques in pulmonary rehab to help control SOB and maintain acceptable oygen saturations. Long: Patient will independenly use respiratory medications and breathing techniques to manage SOB and oxygen saturations during daily life activities. at home and during pulmonary rehab.           Oxygen Discharge (Final Oxygen Re-Evaluation):     Oxygen Re-Evaluation - 08/17/17 1131      Goals/Expected Outcomes   Short Term Goals To learn and understand importance of monitoring SPO2 with pulse oximeter and demonstrate accurate use of the pulse oximeter.;To learn and demonstrate proper purse lipped breathing techniques or other breathing techniques.;To Learn and understand  importance of maintaining oxygen saturations>88%;To learn and demonstrate proper use of respiratory medications;To learn and exhibit compliance with exercise, home and travel O2 prescription   Long  Term Goals Exhibits proper breathing techniques, such as purse lipped breathing or other method taught during program session;Maintenance of O2 saturations>88%;Compliance with respiratory medication;Exhibits compliance with exercise, home and travel O2 prescription;Verbalizes importance of monitoring SPO2 with pulse oximeter  and return demonstration   Comments On patient's first day of exercise pursed lip breathing was discussed and demonstrated as well as instruction on using the pulse oximeter and proper oxygen saturations during rest and exercise. Patient demonstrated understanding of these concepts.    Goals/Expected Outcomes Short: Patient will use PLB techniques in pulmonary rehab to help control SOB and maintain acceptable oygen saturations. Long: Patient will independenly use respiratory medications and breathing techniques to manage SOB and oxygen saturations during daily life activities. at home and during pulmonary rehab.       Initial Exercise Prescription:     Initial Exercise Prescription - 07/20/17 1600      Date of Initial Exercise RX and Referring Provider   Date 07/20/17   Referring Provider [facility]     Treadmill   MPH 1.8   Grade 0   Minutes 15   METs 2.38     NuStep   Level 1   SPM 80   Minutes 15   METs 2     REL-XR   Level 1   Speed 50   Minutes 15   METs 2     Prescription Details   Frequency (times per week) 3   Duration Progress to 45 minutes of aerobic exercise without signs/symptoms of physical distress     Intensity   THRR 40-80% of Max Heartrate 108-141   Ratings of Perceived Exertion 11-13   Perceived Dyspnea 0-4     Progression   Progression Continue to progress workloads to maintain intensity without signs/symptoms of physical distress.     Resistance Training   Training Prescription Yes   Weight 2 lbs   Reps 10-15      Perform Capillary Blood Glucose checks as needed.  Exercise Prescription Changes:   Exercise Comments:     Exercise Comments    Row Name 08/17/17 1130           Exercise Comments First full day of exercise!  Patient was oriented to gym and equipment including functions, settings, policies, and procedures.  Patient's individual exercise prescription and treatment plan were reviewed.  All starting workloads were  established based on the results of the 6 minute walk test done at initial orientation visit.  The plan for exercise progression was also introduced and progression will be customized based on patient's performance and goals          Exercise Goals and Review:     Exercise Goals    Row Name 07/20/17 1615             Exercise Goals   Increase Physical Activity Yes       Intervention Provide advice, education, support and counseling about physical activity/exercise needs.;Develop an individualized exercise prescription for aerobic and resistive training based on initial evaluation findings, risk stratification, comorbidities and participant's personal goals.       Expected Outcomes Achievement of increased cardiorespiratory fitness and enhanced flexibility, muscular endurance and strength shown through measurements of functional capacity and personal statement of participant.       Increase Strength and Stamina Yes  Intervention Provide advice, education, support and counseling about physical activity/exercise needs.;Develop an individualized exercise prescription for aerobic and resistive training based on initial evaluation findings, risk stratification, comorbidities and participant's personal goals.       Expected Outcomes Achievement of increased cardiorespiratory fitness and enhanced flexibility, muscular endurance and strength shown through measurements of functional capacity and personal statement of participant.          Exercise Goals Re-Evaluation :     Exercise Goals Re-Evaluation    Mettler Name 08/17/17 1141             Exercise Goal Re-Evaluation   Exercise Goals Review Increase Physical Activity;Increase Strenth and Stamina       Comments Diana came to her first day of exercise today. She had been out for several week since her initail medical review.        Expected Outcomes Short: Miciah will be consistant with her attendance to pulmonary rehab. Long: With  consistant attendance and exercise Korie will gain strength and stamina to progress with her exercise in rehab and other ADL's.           Discharge Exercise Prescription (Final Exercise Prescription Changes):   Nutrition:  Target Goals: Understanding of nutrition guidelines, daily intake of sodium <1544m, cholesterol <2097m calories 30% from fat and 7% or less from saturated fats, daily to have 5 or more servings of fruits and vegetables.  Biometrics:     Pre Biometrics - 07/20/17 1615      Pre Biometrics   Height 5' 2.2" (1.58 m)   Weight 211 lb 3.2 oz (95.8 kg)   Waist Circumference 41 inches   Hip Circumference 43.5 inches   Waist to Hip Ratio 0.94 %   BMI (Calculated) 38.5       Nutrition Therapy Plan and Nutrition Goals:     Nutrition Therapy & Goals - 07/20/17 1659      Intervention Plan   Intervention Prescribe, educate and counsel regarding individualized specific dietary modifications aiming towards targeted core components such as weight, hypertension, lipid management, diabetes, heart failure and other comorbidities.;Nutrition handout(s) given to patient.   Expected Outcomes Short Term Goal: Understand basic principles of dietary content, such as calories, fat, sodium, cholesterol and nutrients.;Short Term Goal: A plan has been developed with personal nutrition goals set during dietitian appointment.;Long Term Goal: Adherence to prescribed nutrition plan.      Nutrition Discharge: Rate Your Plate Scores:   Nutrition Goals Re-Evaluation:     Nutrition Goals Re-Evaluation    Row Name 08/17/17 1139             Goals   Current Weight 206 lb (93.4 kg)       Nutrition Goal Has a scheduled appointment to meet with the dietician on 09/10/17 to set more specific nutrition goal.           Nutrition Goals Discharge (Final Nutrition Goals Re-Evaluation):     Nutrition Goals Re-Evaluation - 08/17/17 1139      Goals   Current Weight 206 lb (93.4 kg)    Nutrition Goal Has a scheduled appointment to meet with the dietician on 09/10/17 to set more specific nutrition goal.       Psychosocial: Target Goals: Acknowledge presence or absence of significant depression and/or stress, maximize coping skills, provide positive support system. Participant is able to verbalize types and ability to use techniques and skills needed for reducing stress and depression.   Initial Review & Psychosocial Screening:  Initial Psych Review & Screening - 07/20/17 1700      Initial Review   Current issues with None Identified     Family Dynamics   Good Support System? Yes     Barriers   Psychosocial barriers to participate in program The patient should benefit from training in stress management and relaxation.     Screening Interventions   Interventions Program counselor consult      Quality of Life Scores:     Quality of Life - 07/20/17 1620      Quality of Life Scores   Health/Function Pre 15.25 %   Socioeconomic Pre 19.36 %   Psych/Spiritual Pre 19.79 %   Family Pre 21.25 %   GLOBAL Pre 17.74 %      PHQ-9: Recent Review Flowsheet Data    Depression screen [facility] 2/9 07/20/2017   Decreased Interest 1   Down, Depressed, Hopeless 0   PHQ - 2 Score 1   Altered sleeping 2   Tired, decreased energy 3   Change in appetite 2   Feeling bad or failure about yourself  1   Trouble concentrating 3   Moving slowly or fidgety/restless 0   Suicidal thoughts 0   PHQ-9 Score 12   Difficult doing work/chores Somewhat difficult     Interpretation of Total Score  Total Score Depression Severity:  1-4 = Minimal depression, 5-9 = Mild depression, 10-14 = Moderate depression, 15-19 = Moderately severe depression, 20-27 = Severe depression   Psychosocial Evaluation and Intervention:     Psychosocial Evaluation - 08/17/17 1028      Psychosocial Evaluation & Interventions   Interventions Encouraged to exercise with the program and follow exercise  prescription;Relaxation education;Stress management education   Comments Counselor met with Ms. Sunday Corn today Hassan Rowan) for initial psychosocial evaluation.  She is a 66 year old who has asthma and many other health issues, including RA; Fibromyalgia; HBP and High Cholesterol; obesity and she had her thyroid removed approximately 10 years ago.  Nikeisha has a strong support system with a spouse of 22 years and she is actively involved in her local church.  Kassadie has chronic sleep problems with maybe 4 nights of 4-6 hours per week on average.  She denies a history of depression or anxiety or any current symptoms and states she is typically in a positive mood most of the time.  Annabell has multiple stressors with her health; caring for her mother with dementia and finances.  She has goals to lose weight and increase her stamina and strength.  Samhitha will be followed by staff throughout the course of this program.     Expected Outcomes Media will benefit from consistent exercise to achieve her stated goals.  She will be seeing the dietician who will help her address her weight loss goals.  Cara will also benefit from the educational and psychoeducational components of this program to help her manage her medical issues and cope better with her stressors.     Continue Psychosocial Services  Follow up required by staff      Psychosocial Re-Evaluation:   Psychosocial Discharge (Final Psychosocial Re-Evaluation):   Education: Education Goals: Education classes will be provided on a weekly basis, covering required topics. Participant will state understanding/return demonstration of topics presented.  Learning Barriers/Preferences:     Learning Barriers/Preferences - 07/20/17 1656      Learning Barriers/Preferences   Learning Barriers None   Learning Preferences None      Education Topics: Initial Evaluation  Education: - Verbal, written and demonstration of respiratory meds, RPE/PD scales, oximetry  and breathing techniques. Instruction on use of nebulizers and MDIs: cleaning and proper use, rinsing mouth with steroid doses and importance of monitoring MDI activations.   Pulmonary Rehab from 08/19/2017 in [facility] Cardiac and Pulmonary Rehab  Date  07/20/17  Educator  [facility]  Instruction Review Code (retired)  2- meets goals/outcomes      General Nutrition Guidelines/Fats and Fiber: -Group instruction provided by verbal, written material, models and posters to present the general guidelines for heart healthy nutrition. Gives an explanation and review of dietary fats and fiber.   Pulmonary Rehab from 08/19/2017 in [facility] Cardiac and Pulmonary Rehab  Date  08/17/17  Educator  CR  Instruction Review Code (retired)  2- meets goals/outcomes      Controlling Sodium/Reading Food Labels: -Group verbal and written material supporting the discussion of sodium use in heart healthy nutrition. Review and explanation with models, verbal and written materials for utilization of the food label.   Exercise Physiology & Risk Factors: - Group verbal and written instruction with models to review the exercise physiology of the cardiovascular system and associated critical values. Details cardiovascular disease risk factors and the goals associated with each risk factor.   Aerobic Exercise & Resistance Training: - Gives group verbal and written discussion on the health impact of inactivity. On the components of aerobic and resistive training programs and the benefits of this training and how to safely progress through these programs.   Flexibility, Balance, General Exercise Guidelines: - Provides group verbal and written instruction on the benefits of flexibility and balance training programs. Provides general exercise guidelines with specific guidelines to those with heart or lung disease. Demonstration and skill practice provided.   Stress Management: - Provides group verbal and written instruction about the  health risks of elevated stress, cause of high stress, and healthy ways to reduce stress.   Depression: - Provides group verbal and written instruction on the correlation between heart/lung disease and depressed mood, treatment options, and the stigmas associated with seeking treatment.   Pulmonary Rehab from 08/19/2017 in [facility] Cardiac and Pulmonary Rehab  Date  08/19/17  Educator  [facility]  Instruction Review Code (retired)  2- meets goals/outcomes      Exercise & Equipment Safety: - Individual verbal instruction and demonstration of equipment use and safety with use of the equipment.   Pulmonary Rehab from 08/19/2017 in [facility] Cardiac and Pulmonary Rehab  Date  08/17/17  Educator  [facility]  Instruction Review Code (retired)  2- meets goals/outcomes      Infection Prevention: - Provides verbal and written material to individual with discussion of infection control including proper hand washing and proper equipment cleaning during exercise session.   Pulmonary Rehab from 08/19/2017 in [facility] Cardiac and Pulmonary Rehab  Date  07/20/17  Educator  [facility]  Instruction Review Code (retired)  2- meets [employer] Prevention: - Provides verbal and written material to individual with discussion of falls prevention and safety.   Pulmonary Rehab from 08/19/2017 in [facility] Cardiac and Pulmonary Rehab  Date  07/20/17  Educator  [facility]  Instruction Review Code (retired)  2- meets goals/outcomes      Diabetes: - Individual verbal and written instruction to review signs/symptoms of diabetes, desired ranges of glucose level fasting, after meals and with exercise. Advice that pre and post exercise glucose checks will be done for 3 sessions at entry of program.   Pulmonary  Rehab from 08/19/2017 in [facility] Cardiac and Pulmonary Rehab  Date  07/20/17  Educator  [facility]  Instruction Review Code (retired)  2- meets goals/outcomes      Chronic Lung Diseases: - Group verbal and written instruction to review new  updates, new respiratory medications, new advancements in procedures and treatments. Provide informative websites and "800" numbers of self-education.   Lung Procedures: - Group verbal and written instruction to describe testing methods done to diagnose lung disease. Review the outcome of test results. Describe the treatment choices: Pulmonary Function Tests, ABGs and oximetry.   Energy Conservation: - Provide group verbal and written instruction for methods to conserve energy, plan and organize activities. Instruct on pacing techniques, use of adaptive equipment and posture/positioning to relieve shortness of breath.   Triggers: - Group verbal and written instruction to review types of environmental controls: home humidity, furnaces, filters, dust mite/pet prevention, HEPA vacuums. To discuss weather changes, air quality and the benefits of nasal washing.   Exacerbations: - Group verbal and written instruction to provide: warning signs, infection symptoms, calling MD promptly, preventive modes, and value of vaccinations. Review: effective airway clearance, coughing and/or vibration techniques. Create an Sports administrator.   Oxygen: - Individual and group verbal and written instruction on oxygen therapy. Includes supplement oxygen, available portable oxygen systems, continuous and intermittent flow rates, oxygen safety, concentrators, and Medicare reimbursement for oxygen.   Respiratory Medications: - Group verbal and written instruction to review medications for lung disease. Drug class, frequency, complications, importance of spacers, rinsing mouth after steroid MDI's, and proper cleaning methods for nebulizers.   AED/CPR: - Group verbal and written instruction with the use of models to demonstrate the basic use of the AED with the basic ABC's of resuscitation.   Breathing Retraining: - Provides individuals verbal and written instruction on purpose, frequency, and proper technique of  diaphragmatic breathing and pursed-lipped breathing. Applies individual practice skills.   Pulmonary Rehab from 08/19/2017 in [facility] Cardiac [facility]monary [facility]  Date  08/17/17  Educator  [facility]  Instruction Review Code (retired)  2- Lawyer and Physiology of the Lungs: - Group verbal and written instruction with the use of models to provide basic lung anatomy and physiology related to function, structure and complications of lung disease.   Anatomy & Physiology of the Heart: - Group verbal and written instruction and models provide basic cardiac anatomy and physiology, with the coronary electrical and arterial systems. Review of: AMI, Angina, Valve disease, Heart Failure, Cardiac Arrhythmia, Pacemakers, and the ICD.   Heart Failure: - Group verbal and written instruction on the basics of heart failure: signs/symptoms, treatments, explanation of ejection fraction, enlarged heart and cardiomyopathy.   Sleep Apnea: - Individual verbal and written instruction to review Obstructive Sleep Apnea. Review of risk factors, methods for diagnosing and types of masks and machines for OSA.   Anxiety: - Provides group, verbal and written instruction on the correlation between heart/lung disease and anxiety, treatment options, and management of anxiety.   Relaxation: - Provides group, verbal and written instruction about the benefits of relaxation for patients with heart/lung disease. Also provides patients with examples of relaxation techniques.   Cardiac Medications: - Group verbal and written instruction to review commonly prescribed medications for heart disease. Reviews the medication, class of the drug, and side effects.   Know Your Numbers: -Group verbal and written instruction about important numbers in your health.  Review of Cholesterol, Blood Pressure, Diabetes, and BMI and  the role they play in your overall health.   Other: -Provides group and verbal  instruction on various topics (see comments)    Knowledge Questionnaire Score:     Knowledge Questionnaire Score - 07/20/17 1618      Knowledge Questionnaire Score   Pre Score 8/10  reviewed with pt today       Core Components/Risk Factors/Patient Goals at Admission:     Personal Goals and Risk Factors at Admission - 07/20/17 1653      Core Components/Risk Factors/Patient Goals on Admission    Weight Management Yes   Intervention Weight Management: Develop a combined nutrition and exercise program designed to reach desired caloric intake, while maintaining appropriate intake of nutrient and fiber, sodium and fats, and appropriate energy expenditure required for the weight goal.;Weight Management: Provide education and appropriate resources to help participant work on and attain dietary goals.;Weight Management/Obesity: Establish reasonable short term and long term weight goals.   Admit Weight 215 lb (97.5 kg)   Goal Weight: Short Term 210 lb (95.3 kg)   Goal Weight: Long Term 150 lb (68 kg)   Expected Outcomes Understanding of distribution of calorie intake throughout the day with the consumption of 4-5 meals/snacks;Understanding recommendations for meals to include 15-35% energy as protein, 25-35% energy from fat, 35-60% energy from carbohydrates, less than '200mg'$  of dietary cholesterol, 20-35 gm of total fiber daily;Weight Loss: Understanding of general recommendations for a balanced deficit meal plan, which promotes 1-2 lb weight loss per week and includes a negative energy balance of [phone] kcal/d;Weight Maintenance: Understanding of the daily nutrition guidelines, which includes 25-35% calories from fat, 7% or less cal from saturated fats, less than '200mg'$  cholesterol, less than 1.5gm of sodium, & 5 or more servings of fruits and vegetables daily;Short Term: Continue to assess and modify interventions until short term weight is achieved   Diabetes Yes   Intervention Provide education  about signs/symptoms and action to take for hypo/hyperglycemia.;Provide education about proper nutrition, including hydration, and aerobic/resistive exercise prescription along with prescribed medications to achieve blood glucose in normal ranges: Fasting glucose 65-99 mg/dL   Expected Outcomes Short Term: Participant verbalizes understanding of the signs/symptoms and immediate care of hyper/hypoglycemia, proper foot care and importance of medication, aerobic/resistive exercise and nutrition plan for blood glucose control.;Long Term: Attainment of HbA1C < 7%.   Lipids Yes   Intervention Provide education and support for participant on nutrition & aerobic/resistive exercise along with prescribed medications to achieve LDL '70mg'$ , HDL >'40mg'$ .   Expected Outcomes Short Term: Participant states understanding of desired cholesterol values and is compliant with medications prescribed. Participant is following exercise prescription and nutrition guidelines.;Long Term: Cholesterol controlled with medications as prescribed, with individualized exercise RX and with personalized nutrition plan. Value goals: LDL < '70mg'$ , HDL > 40 mg.      Core Components/Risk Factors/Patient Goals Review:      Goals and Risk Factor Review    Row Name 08/17/17 1136             Core Components/Risk Factors/Patient Goals Review   Personal Goals Review Weight Management/Obesity       Review Weight loss was discussed with that patient. We discussed how physical activity and exercise along with dietary changes can aid in this weight loss goal. She was encouraged to make an appointment with the dietician, which she did.        Expected Outcomes Short: Jayana has a short term goal of losing 1-2 lbs per week and aiming  towards 196 lbs( 10 lbs loss). Carolynn has will meet with the dietician on 09/10/17 and will continue to consistantly come to  pulmonary rehab for exercise.  Long: Long term weight goal is 150 lbs.           Core  Components/Risk Factors/Patient Goals at Discharge (Final Review):      Goals and Risk Factor Review - 08/17/17 1136      Core Components/Risk Factors/Patient Goals Review   Personal Goals Review Weight Management/Obesity   Review Weight loss was discussed with that patient. We discussed how physical activity and exercise along with dietary changes can aid in this weight loss goal. She was encouraged to make an appointment with the dietician, which she did.    Expected Outcomes Short: Mindi has a short term goal of losing 1-2 lbs per week and aiming towards 196 lbs( 10 lbs loss). Necola has will meet with the dietician on 09/10/17 and will continue to consistantly come to  pulmonary rehab for exercise.  Long: Long term weight goal is 150 lbs.       ITP Comments:     ITP Comments    Row Name 07/20/17 1623 07/27/17 0845 07/28/17 6789 08/17/17 1038 08/24/17 0811   ITP Comments Med eval completed.  Documentation for diagnosis can be found in [place] notes that will be sent to medical records. 30 day review completed ITP sent to Dr. Ramonita Lab for Dr. Emily Filbert Director of [place]. Continue with ITP unless changes are made by physician. Patient has not yet had first day of exercise  Ms. Carollo called to say she cannot start until Monday August 20th due to her mom needing a sitter while her husband is away. Patient had her first day of exercise. She felt a little short of breath post exercise and took her albuterol MDI. Patient recovered and continued to exercise. 30 day review completed. ITP sent to Dr. Emily Filbert Director of [place]. Continue with ITP unless changes are made by physician.        Comments: 30 day review

## 2017-09-02 ENCOUNTER — Encounter: Payer: Non-veteran care | Attending: Internal Medicine

## 2017-09-02 ENCOUNTER — Telehealth: Payer: Self-pay

## 2017-09-02 DIAGNOSIS — I10 Essential (primary) hypertension: Secondary | ICD-10-CM | POA: Insufficient documentation

## 2017-09-02 DIAGNOSIS — Z79899 Other long term (current) drug therapy: Secondary | ICD-10-CM | POA: Insufficient documentation

## 2017-09-02 DIAGNOSIS — J4550 Severe persistent asthma, uncomplicated: Secondary | ICD-10-CM | POA: Insufficient documentation

## 2017-09-02 DIAGNOSIS — M069 Rheumatoid arthritis, unspecified: Secondary | ICD-10-CM | POA: Insufficient documentation

## 2017-09-02 DIAGNOSIS — Z7982 Long term (current) use of aspirin: Secondary | ICD-10-CM | POA: Insufficient documentation

## 2017-09-02 DIAGNOSIS — Z7951 Long term (current) use of inhaled steroids: Secondary | ICD-10-CM | POA: Insufficient documentation

## 2017-09-02 NOTE — Telephone Encounter (Signed)
Sandra Fischer called to inform us that she will not be back until September 10th due to her mother being admitted to the hospital.

## 2017-09-09 ENCOUNTER — Telehealth: Payer: Self-pay

## 2017-09-09 NOTE — Telephone Encounter (Signed)
Sandra Fischer has some appointments, has been out of town and will try to return next Wednesday.

## 2017-09-21 DIAGNOSIS — J4550 Severe persistent asthma, uncomplicated: Secondary | ICD-10-CM

## 2017-09-21 NOTE — Progress Notes (Signed)
Pulmonary Individual Treatment Plan  Patient Details  Name: Sandra Fischer MRN: [id number] Date of Birth: 11/17/1951 Referring Provider:     Pulmonary Rehab from 07/20/2017 in [facility] and Pulmonary Rehab  Referring Provider   [facility]      Initial Encounter Date:    Pulmonary Rehab from 07/20/2017 in [facility] Cardiac and Pulmonary Rehab  Date  07/20/17  Referring Provider  [facility]      Visit Diagnosis: Severe persistent asthma without complication  Patient's Home Medications on Admission:  Current Outpatient Prescriptions:  .  Abatacept 125 MG/ML SOAJ, Take 750 mg by mouth., Disp: , Rfl:  .  albuterol (ACCUNEB) 1.25 MG/3ML nebulizer solution, Take 1 ampule by nebulization every 6 (six) hours as needed for wheezing., Disp: , Rfl:  .  albuterol (PROVENTIL HFA;VENTOLIN HFA) 108 (90 Base) MCG/ACT inhaler, Inhale into the lungs every 6 (six) hours as needed for wheezing or shortness of breath., Disp: , Rfl:  .  albuterol (PROVENTIL HFA;VENTOLIN HFA) 108 (90 Base) MCG/ACT inhaler, Inhale 2 puffs into the lungs every 6 (six) hours as needed for wheezing or shortness of breath., Disp: 1 Inhaler, Rfl: 0 .  aspirin EC 81 MG tablet, Take 81 mg by mouth daily., Disp: , Rfl:  .  azithromycin (ZITHROMAX) 500 MG tablet, Take 1 tablet (500 mg total) by mouth daily., Disp: 4 tablet, Rfl: 0 .  budesonide-formoterol (SYMBICORT) 160-4.5 MCG/ACT inhaler, Inhale 2 puffs into the lungs 2 (two) times daily., Disp: , Rfl:  .  cetirizine (ZYRTEC) 10 MG tablet, Take 10 mg by mouth daily., Disp: , Rfl:  .  desloratadine (CLARINEX) 5 MG tablet, Take 5 mg by mouth daily., Disp: , Rfl:  .  ferrous sulfate 325 (65 FE) MG tablet, Take 325 mg by mouth daily with breakfast., Disp: , Rfl:  .  fluconazole (DIFLUCAN) 150 MG tablet, Take 1 tablet (150 mg total) by mouth once. (Patient not taking: Reported on 08/23/2016), Disp: 2 tablet, Rfl: 1 .  guaiFENesin-dextromethorphan (ROBITUSSIN DM) 100-10 MG/5ML syrup, Take 5  mLs by mouth every 4 (four) hours as needed for cough., Disp: 118 mL, Rfl: 0 .  ipratropium (ATROVENT) 0.02 % nebulizer solution, Take 0.5 mg by nebulization 4 (four) times daily., Disp: , Rfl:  .  levothyroxine (SYNTHROID, LEVOTHROID) 88 MCG tablet, Take 88 mcg by mouth daily before breakfast., Disp: , Rfl:  .  lisinopril (PRINIVIL,ZESTRIL) 40 MG tablet, Take 40 mg by mouth daily., Disp: , Rfl:  .  montelukast (SINGULAIR) 10 MG tablet, Take 10 mg by mouth at bedtime., Disp: , Rfl:  .  omeprazole (PRILOSEC) 10 MG capsule, Take 20 mg by mouth daily., Disp: , Rfl:  .  pravastatin (PRAVACHOL) 40 MG tablet, Take 40 mg by mouth daily., Disp: , Rfl:  .  predniSONE (DELTASONE) 10 MG tablet, Take 1 tablet (10 mg total) by mouth daily., Disp: 42 tablet, Rfl: 0 .  Prenatal Vit-Fe Fumarate-FA (MULTIVITAMIN-PRENATAL) 27-0.8 MG TABS tablet, Take 1 tablet by mouth daily at 12 noon., Disp: , Rfl:  .  sulfamethoxazole-trimethoprim (BACTRIM DS,SEPTRA DS) 800-160 MG tablet, Take 1 tablet by mouth 2 (two) times daily., Disp: 14 tablet, Rfl: 0 .  traMADol (ULTRAM) 50 MG tablet, Take 100 mg by mouth as needed (for rheumatoid arthritis)., Disp: , Rfl:  .  verapamil (CALAN) 120 MG tablet, Take 240 mg by mouth 2 (two) times daily., Disp: , Rfl:   Past Medical History: Past Medical History:  Diagnosis Date  . Asthma   .  Hypertension   . RA (rheumatoid arthritis) (HCC)     Tobacco Use: History  Smoking Status  . Never Smoker  Smokeless Tobacco  . Never Used    Labs: Recent Review Flowsheet Data    There is no flowsheet data to display.       Pulmonary Assessment Scores:     Pulmonary Assessment Scores    Row Name 07/20/17 1619         ADL UCSD   ADL Phase Entry     SOB Score total 40     Rest 1     Walk 2     Stairs 4     Bath 1     Dress 2     Shop 3       mMRC Score   mMRC Score 4        Pulmonary Function Assessment:     Pulmonary Function Assessment - 07/20/17 1657       Pulmonary Function Tests   FVC% 62 %   FEV1% 39 %   FEV1/FVC Ratio 50      Exercise Target Goals:    Exercise Program Goal: Individual exercise prescription set with THRR, safety & activity barriers. Participant demonstrates ability to understand and report RPE using BORG scale, to self-measure pulse accurately, and to acknowledge the importance of the exercise prescription.  Exercise Prescription Goal: Starting with aerobic activity 30 plus minutes a day, 3 days per week for initial exercise prescription. Provide home exercise prescription and guidelines that participant acknowledges understanding prior to discharge.  Activity Barriers & Risk Stratification:     Activity Barriers & Cardiac Risk Stratification - 07/20/17 1613      Activity Barriers & Cardiac Risk Stratification   Activity Barriers Arthritis;Deconditioning;Muscular Weakness;Shortness of Breath;Joint Problems  Rhuematoid Arthritis, joints swell (especially ankles, hips, and knees0      6 Minute Walk:     6 Minute Walk    Row Name 07/20/17 1611         6 Minute Walk   Phase Initial     Distance 1122 feet     Walk Time 6 minutes     # of Rest Breaks 0     MPH 2.13     METS 2.4     RPE 13     Perceived Dyspnea  3     VO2 Peak 8.41     Symptoms Yes (comment)     Comments SOB, chest tightness with breathing heavier     Resting HR 75 bpm     Resting BP 138/64     Max Ex. HR 105 bpm     Max Ex. BP 134/56     2 Minute Post BP 130/60       Interval HR   Baseline HR (retired) 75     3 Minute HR 105     6 Minute HR 105     2 Minute Post HR 75     Interval Heart Rate? Yes  pulse oximeter did not pick up heart rate very well       Interval Oxygen   Interval Oxygen? Yes     Baseline Oxygen Saturation % 95 %     Resting Liters of Oxygen 0 L  Room Air     1 Minute Oxygen Saturation % 94 %     1 Minute Liters of Oxygen 0 L     2 Minute Oxygen Saturation % 93 %  2 Minute Liters of Oxygen 0 L     3  Minute Oxygen Saturation % 95 %     3 Minute Liters of Oxygen 0 L     4 Minute Oxygen Saturation % 95 %     4 Minute Liters of Oxygen 0 L     5 Minute Oxygen Saturation % 96 %     5 Minute Liters of Oxygen 0 L     6 Minute Oxygen Saturation % 96 %     6 Minute Liters of Oxygen 0 L     2 Minute Post Oxygen Saturation % 98 %     2 Minute Post Liters of Oxygen 0 L       Oxygen Initial Assessment:     Oxygen Initial Assessment - 07/20/17 1655      Home Oxygen   Home Oxygen Device None   Sleep Oxygen Prescription None   Home Exercise Oxygen Prescription None   Home at Rest Exercise Oxygen Prescription None     Initial 6 min Walk   Oxygen Used None     Intervention   Short Term Goals To learn and understand importance of monitoring SPO2 with pulse oximeter and demonstrate accurate use of the pulse oximeter.;To Learn and understand importance of maintaining oxygen saturations>88%;To learn and demonstrate proper purse lipped breathing techniques or other breathing techniques.;To learn and demonstrate proper use of respiratory medications   Long  Term Goals Maintenance of O2 saturations>88%;Compliance with respiratory medication;Demonstrates proper use of MDI's;Exhibits proper breathing techniques, such as purse lipped breathing or other method taught during program session;Verbalizes importance of monitoring SPO2 with pulse oximeter and return demonstration      Oxygen Re-Evaluation:     Oxygen Re-Evaluation    Row Name 08/17/17 1131             Goals/Expected Outcomes   Short Term Goals To learn and understand importance of monitoring SPO2 with pulse oximeter and demonstrate accurate use of the pulse oximeter.;To learn and demonstrate proper purse lipped breathing techniques or other breathing techniques.;To Learn and understand importance of maintaining oxygen saturations>88%;To learn and demonstrate proper use of respiratory medications;To learn and exhibit compliance with  exercise, home and travel O2 prescription       Long  Term Goals Exhibits proper breathing techniques, such as purse lipped breathing or other method taught during program session;Maintenance of O2 saturations>88%;Compliance with respiratory medication;Exhibits compliance with exercise, home and travel O2 prescription;Verbalizes importance of monitoring SPO2 with pulse oximeter and return demonstration       Comments On patient's first day of exercise pursed lip breathing was discussed and demonstrated as well as instruction on using the pulse oximeter and proper oxygen saturations during rest and exercise. Patient demonstrated understanding of these concepts.        Goals/Expected Outcomes Short: Patient will use PLB techniques in pulmonary rehab to help control SOB and maintain acceptable oygen saturations. Long: Patient will independenly use respiratory medications and breathing techniques to manage SOB and oxygen saturations during daily life activities. at home and during pulmonary rehab.           Oxygen Discharge (Final Oxygen Re-Evaluation):     Oxygen Re-Evaluation - 08/17/17 1131      Goals/Expected Outcomes   Short Term Goals To learn and understand importance of monitoring SPO2 with pulse oximeter and demonstrate accurate use of the pulse oximeter.;To learn and demonstrate proper purse lipped breathing techniques or other breathing techniques.;To Learn and understand  importance of maintaining oxygen saturations>88%;To learn and demonstrate proper use of respiratory medications;To learn and exhibit compliance with exercise, home and travel O2 prescription   Long  Term Goals Exhibits proper breathing techniques, such as purse lipped breathing or other method taught during program session;Maintenance of O2 saturations>88%;Compliance with respiratory medication;Exhibits compliance with exercise, home and travel O2 prescription;Verbalizes importance of monitoring SPO2 with pulse oximeter  and return demonstration   Comments On patient's first day of exercise pursed lip breathing was discussed and demonstrated as well as instruction on using the pulse oximeter and proper oxygen saturations during rest and exercise. Patient demonstrated understanding of these concepts.    Goals/Expected Outcomes Short: Patient will use PLB techniques in pulmonary rehab to help control SOB and maintain acceptable oygen saturations. Long: Patient will independenly use respiratory medications and breathing techniques to manage SOB and oxygen saturations during daily life activities. at home and during pulmonary rehab.       Initial Exercise Prescription:     Initial Exercise Prescription - 07/20/17 1600      Date of Initial Exercise RX and Referring Provider   Date 07/20/17   Referring Provider [facility]     Treadmill   MPH 1.8   Grade 0   Minutes 15   METs 2.38     NuStep   Level 1   SPM 80   Minutes 15   METs 2     REL-XR   Level 1   Speed 50   Minutes 15   METs 2     Prescription Details   Frequency (times per week) 3   Duration Progress to 45 minutes of aerobic exercise without signs/symptoms of physical distress     Intensity   THRR 40-80% of Max Heartrate 108-141   Ratings of Perceived Exertion 11-13   Perceived Dyspnea 0-4     Progression   Progression Continue to progress workloads to maintain intensity without signs/symptoms of physical distress.     Resistance Training   Training Prescription Yes   Weight 2 lbs   Reps 10-15      Perform Capillary Blood Glucose checks as needed.  Exercise Prescription Changes:     Exercise Prescription Changes    Row Name 08/26/17 1100             Response to Exercise   Blood Pressure (Admit) 126/70       Blood Pressure (Exercise) 148/92       Blood Pressure (Exit) 126/64       Heart Rate (Admit) 85 bpm       Heart Rate (Exercise) 148 bpm       Heart Rate (Exit) 86 bpm       Oxygen Saturation (Admit) 96 %        Oxygen Saturation (Exercise) 96 %       Oxygen Saturation (Exit) 95 %       Rating of Perceived Exertion (Exercise) 12       Perceived Dyspnea (Exercise) 1       Symptoms none       Duration Progress to 45 minutes of aerobic exercise without signs/symptoms of physical distress       Intensity THRR unchanged         Progression   Progression Continue to progress workloads to maintain intensity without signs/symptoms of physical distress.       Average METs 2.25         Resistance Training   Training Prescription  Yes       Weight 2 lb       Reps 10-15         Treadmill   MPH 2       Grade 0       Minutes 15       METs 2.52         REL-XR   Level 1       Speed 53       Minutes 15       METs 2.1          Exercise Comments:     Exercise Comments    Row Name 08/17/17 1130           Exercise Comments First full day of exercise!  Patient was oriented to gym and equipment including functions, settings, policies, and procedures.  Patient's individual exercise prescription and treatment plan were reviewed.  All starting workloads were established based on the results of the 6 minute walk test done at initial orientation visit.  The plan for exercise progression was also introduced and progression will be customized based on patient's performance and goals          Exercise Goals and Review:     Exercise Goals    Row Name 07/20/17 1615             Exercise Goals   Increase Physical Activity Yes       Intervention Provide advice, education, support and counseling about physical activity/exercise needs.;Develop an individualized exercise prescription for aerobic and resistive training based on initial evaluation findings, risk stratification, comorbidities and participant's personal goals.       Expected Outcomes Achievement of increased cardiorespiratory fitness and enhanced flexibility, muscular endurance and strength shown through measurements of functional capacity  and personal statement of participant.       Increase Strength and Stamina Yes       Intervention Provide advice, education, support and counseling about physical activity/exercise needs.;Develop an individualized exercise prescription for aerobic and resistive training based on initial evaluation findings, risk stratification, comorbidities and participant's personal goals.       Expected Outcomes Achievement of increased cardiorespiratory fitness and enhanced flexibility, muscular endurance and strength shown through measurements of functional capacity and personal statement of participant.          Exercise Goals Re-Evaluation :     Exercise Goals Re-Evaluation    Row Name 08/17/17 1141 08/26/17 1202 09/09/17 1208         Exercise Goal Re-Evaluation   Exercise Goals Review Increase Physical Activity;Increase Strenth and Stamina Increase Physical Activity  -     Comments Sehar came to her first day of exercise today. She had been out for several week since her initail medical review.  Jennae attended her first two sessions.  She tolerated exercise well.   Lynnae has not attended since 8/22.     Expected Outcomes Short: Joannie will be consistant with her attendance to pulmonary rehab. Long: With consistant attendance and exercise Brissa will gain strength and stamina to progress with her exercise in rehab and other ADL's.  Short - Deserea will attend LW regularly.  Long - Aleeza will improve overall fitness.  -        Discharge Exercise Prescription (Final Exercise Prescription Changes):     Exercise Prescription Changes - 08/26/17 1100      Response to Exercise   Blood Pressure (Admit) 126/70   Blood Pressure (Exercise) 148/92  Blood Pressure (Exit) 126/64   Heart Rate (Admit) 85 bpm   Heart Rate (Exercise) 148 bpm   Heart Rate (Exit) 86 bpm   Oxygen Saturation (Admit) 96 %   Oxygen Saturation (Exercise) 96 %   Oxygen Saturation (Exit) 95 %   Rating of Perceived Exertion  (Exercise) 12   Perceived Dyspnea (Exercise) 1   Symptoms none   Duration Progress to 45 minutes of aerobic exercise without signs/symptoms of physical distress   Intensity THRR unchanged     Progression   Progression Continue to progress workloads to maintain intensity without signs/symptoms of physical distress.   Average METs 2.25     Resistance Training   Training Prescription Yes   Weight 2 lb   Reps 10-15     Treadmill   MPH 2   Grade 0   Minutes 15   METs 2.52     REL-XR   Level 1   Speed 53   Minutes 15   METs 2.1      Nutrition:  Target Goals: Understanding of nutrition guidelines, daily intake of sodium '1500mg'$ , cholesterol '200mg'$ , calories 30% from fat and 7% or less from saturated fats, daily to have 5 or more servings of fruits and vegetables.  Biometrics:     Pre Biometrics - 07/20/17 1615      Pre Biometrics   Height 5' 2.2" (1.58 m)   Weight 211 lb 3.2 oz (95.8 kg)   Waist Circumference 41 inches   Hip Circumference 43.5 inches   Waist to Hip Ratio 0.94 %   BMI (Calculated) 38.5       Nutrition Therapy Plan and Nutrition Goals:     Nutrition Therapy & Goals - 07/20/17 1659      Intervention Plan   Intervention Prescribe, educate and counsel regarding individualized specific dietary modifications aiming towards targeted core components such as weight, hypertension, lipid management, diabetes, heart failure and other comorbidities.;Nutrition handout(s) given to patient.   Expected Outcomes Short Term Goal: Understand basic principles of dietary content, such as calories, fat, sodium, cholesterol and nutrients.;Short Term Goal: A plan has been developed with personal nutrition goals set during dietitian appointment.;Long Term Goal: Adherence to prescribed nutrition plan.      Nutrition Discharge: Rate Your Plate Scores:   Nutrition Goals Re-Evaluation:     Nutrition Goals Re-Evaluation    Row Name 08/17/17 1139             Goals    Current Weight 206 lb (93.4 kg)       Nutrition Goal Has a scheduled appointment to meet with the dietician on 09/10/17 to set more specific nutrition goal.           Nutrition Goals Discharge (Final Nutrition Goals Re-Evaluation):     Nutrition Goals Re-Evaluation - 08/17/17 1139      Goals   Current Weight 206 lb (93.4 kg)   Nutrition Goal Has a scheduled appointment to meet with the dietician on 09/10/17 to set more specific nutrition goal.       Psychosocial: Target Goals: Acknowledge presence or absence of significant depression and/or stress, maximize coping skills, provide positive support system. Participant is able to verbalize types and ability to use techniques and skills needed for reducing stress and depression.   Initial Review & Psychosocial Screening:     Initial Psych Review & Screening - 07/20/17 1700      Initial Review   Current issues with None Identified     Family  Dynamics   Good Support System? Yes     Barriers   Psychosocial barriers to participate in program The patient should benefit from training in stress management and relaxation.     Screening Interventions   Interventions Program counselor consult      Quality of Life Scores:     Quality of Life - 07/20/17 1620      Quality of Life Scores   Health/Function Pre 15.25 %   Socioeconomic Pre 19.36 %   Psych/Spiritual Pre 19.79 %   Family Pre 21.25 %   GLOBAL Pre 17.74 %      PHQ-9: Recent Review Flowsheet Data    Depression screen [facility] 2/9 07/20/2017   Decreased Interest 1   Down, Depressed, Hopeless 0   PHQ - 2 Score 1   Altered sleeping 2   Tired, decreased energy 3   Change in appetite 2   Feeling bad or failure about yourself  1   Trouble concentrating 3   Moving slowly or fidgety/restless 0   Suicidal thoughts 0   PHQ-9 Score 12   Difficult doing work/chores Somewhat difficult     Interpretation of Total Score  Total Score Depression Severity:  1-4 = Minimal  depression, 5-9 = Mild depression, 10-14 = Moderate depression, 15-19 = Moderately severe depression, 20-27 = Severe depression   Psychosocial Evaluation and Intervention:     Psychosocial Evaluation - 08/17/17 1028      Psychosocial Evaluation & Interventions   Interventions Encouraged to exercise with the program and follow exercise prescription;Relaxation education;Stress management education   Comments Counselor met with Ms. Sunday Corn today Hassan Rowan) for initial psychosocial evaluation.  She is a 66 year old who has asthma and many other health issues, including RA; Fibromyalgia; HBP and High Cholesterol; obesity and she had her thyroid removed approximately 10 years ago.  Estha has a strong support system with a spouse of 22 years and she is actively involved in her local church.  Mazella has chronic sleep problems with maybe 4 nights of 4-6 hours per week on average.  She denies a history of depression or anxiety or any current symptoms and states she is typically in a positive mood most of the time.  Sherrian has multiple stressors with her health; caring for her mother with dementia and finances.  She has goals to lose weight and increase her stamina and strength.  Chianna will be followed by staff throughout the course of this program.     Expected Outcomes Coletta will benefit from consistent exercise to achieve her stated goals.  She will be seeing the dietician who will help her address her weight loss goals.  Corryn will also benefit from the educational and psychoeducational components of this program to help her manage her medical issues and cope better with her stressors.     Continue Psychosocial Services  Follow up required by staff      Psychosocial Re-Evaluation:   Psychosocial Discharge (Final Psychosocial Re-Evaluation):   Education: Education Goals: Education classes will be provided on a weekly basis, covering required topics. Participant will state understanding/return  demonstration of topics presented.  Learning Barriers/Preferences:     Learning Barriers/Preferences - 07/20/17 1656      Learning Barriers/Preferences   Learning Barriers None   Learning Preferences None      Education Topics: Initial Evaluation Education: - Verbal, written and demonstration of respiratory meds, RPE/PD scales, oximetry and breathing techniques. Instruction on use of nebulizers and MDIs: cleaning and proper use,  rinsing mouth with steroid doses and importance of monitoring MDI activations.   Pulmonary Rehab from 08/19/2017 in [facility] Cardiac and Pulmonary Rehab  Date  07/20/17  Educator  [facility]  Instruction Review Code (retired)  2- meets goals/outcomes      General Nutrition Guidelines/Fats and Fiber: -Group instruction provided by verbal, written material, models and posters to present the general guidelines for heart healthy nutrition. Gives an explanation and review of dietary fats and fiber.   Pulmonary Rehab from 08/19/2017 in [facility] Cardiac and Pulmonary Rehab  Date  08/17/17  Educator  CR  Instruction Review Code (retired)  2- meets goals/outcomes      Controlling Sodium/Reading Food Labels: -Group verbal and written material supporting the discussion of sodium use in heart healthy nutrition. Review and explanation with models, verbal and written materials for utilization of the food label.   Exercise Physiology & Risk Factors: - Group verbal and written instruction with models to review the exercise physiology of the cardiovascular system and associated critical values. Details cardiovascular disease risk factors and the goals associated with each risk factor.   Aerobic Exercise & Resistance Training: - Gives group verbal and written discussion on the health impact of inactivity. On the components of aerobic and resistive training programs and the benefits of this training and how to safely progress through these programs.   Flexibility, Balance, General  Exercise Guidelines: - Provides group verbal and written instruction on the benefits of flexibility and balance training programs. Provides general exercise guidelines with specific guidelines to those with heart or lung disease. Demonstration and skill practice provided.   Stress Management: - Provides group verbal and written instruction about the health risks of elevated stress, cause of high stress, and healthy ways to reduce stress.   Depression: - Provides group verbal and written instruction on the correlation between heart/lung disease and depressed mood, treatment options, and the stigmas associated with seeking treatment.   Pulmonary Rehab from 08/19/2017 in [facility] Cardiac and Pulmonary Rehab  Date  08/19/17  Educator  [facility]  Instruction Review Code (retired)  2- meets goals/outcomes      Exercise & Equipment Safety: - Individual verbal instruction and demonstration of equipment use and safety with use of the equipment.   Pulmonary Rehab from 08/19/2017 in [facility] Cardiac and Pulmonary Rehab  Date  08/17/17  Educator  [facility]  Instruction Review Code (retired)  2- meets goals/outcomes      Infection Prevention: - Provides verbal and written material to individual with discussion of infection control including proper hand washing and proper equipment cleaning during exercise session.   Pulmonary Rehab from 08/19/2017 in [facility] Cardiac and Pulmonary Rehab  Date  07/20/17  Educator  [facility]  Instruction Review Code (retired)  2- meets [employer] Prevention: - Provides verbal and written material to individual with discussion of falls prevention and safety.   Pulmonary Rehab from 08/19/2017 in [facility] Cardiac and Pulmonary Rehab  Date  07/20/17  Educator  [facility]  Instruction Review Code (retired)  2- meets goals/outcomes      Diabetes: - Individual verbal and written instruction to review signs/symptoms of diabetes, desired ranges of glucose level fasting, after meals and with  exercise. Advice that pre and post exercise glucose checks will be done for 3 sessions at entry of program.   Pulmonary Rehab from 08/19/2017 in [facility] Cardiac and Pulmonary Rehab  Date  07/20/17  Educator  [facility]  Instruction Review Code (retired)  2- meets goals/outcomes  Chronic Lung Diseases: - Group verbal and written instruction to review new updates, new respiratory medications, new advancements in procedures and treatments. Provide informative websites and "800" numbers of self-education.   Lung Procedures: - Group verbal and written instruction to describe testing methods done to diagnose lung disease. Review the outcome of test results. Describe the treatment choices: Pulmonary Function Tests, ABGs and oximetry.   Energy Conservation: - Provide group verbal and written instruction for methods to conserve energy, plan and organize activities. Instruct on pacing techniques, use of adaptive equipment and posture/positioning to relieve shortness of breath.   Triggers: - Group verbal and written instruction to review types of environmental controls: home humidity, furnaces, filters, dust mite/pet prevention, HEPA vacuums. To discuss weather changes, air quality and the benefits of nasal washing.   Exacerbations: - Group verbal and written instruction to provide: warning signs, infection symptoms, calling MD promptly, preventive modes, and value of vaccinations. Review: effective airway clearance, coughing and/or vibration techniques. Create an Sports administrator.   Oxygen: - Individual and group verbal and written instruction on oxygen therapy. Includes supplement oxygen, available portable oxygen systems, continuous and intermittent flow rates, oxygen safety, concentrators, and Medicare reimbursement for oxygen.   Respiratory Medications: - Group verbal and written instruction to review medications for lung disease. Drug class, frequency, complications, importance of spacers, rinsing  mouth after steroid MDI's, and proper cleaning methods for nebulizers.   AED/CPR: - Group verbal and written instruction with the use of models to demonstrate the basic use of the AED with the basic ABC's of resuscitation.   Breathing Retraining: - Provides individuals verbal and written instruction on purpose, frequency, and proper technique of diaphragmatic breathing and pursed-lipped breathing. Applies individual practice skills.   Pulmonary Rehab from 08/19/2017 in [facility] Cardiac and Pulmonary Rehab  Date  08/17/17  Educator  [facility]  Instruction Review Code (retired)  2- Lawyer and Physiology of the Lungs: - Group verbal and written instruction with the use of models to provide basic lung anatomy and physiology related to function, structure and complications of lung disease.   Anatomy & Physiology of the Heart: - Group verbal and written instruction and models provide basic cardiac anatomy and physiology, with the coronary electrical and arterial systems. Review of: AMI, Angina, Valve disease, Heart Failure, Cardiac Arrhythmia, Pacemakers, and the ICD.   Heart Failure: - Group verbal and written instruction on the basics of heart failure: signs/symptoms, treatments, explanation of ejection fraction, enlarged heart and cardiomyopathy.   Sleep Apnea: - Individual verbal and written instruction to review Obstructive Sleep Apnea. Review of risk factors, methods for diagnosing and types of masks and machines for OSA.   Anxiety: - Provides group, verbal and written instruction on the correlation between heart/lung disease and anxiety, treatment options, and management of anxiety.   Relaxation: - Provides group, verbal and written instruction about the benefits of relaxation for patients with heart/lung disease. Also provides patients with examples of relaxation techniques.   Cardiac Medications: - Group verbal and written instruction to review commonly  prescribed medications for heart disease. Reviews the medication, class of the drug, and side effects.   Know Your Numbers: -Group verbal and written instruction about important numbers in your health.  Review of Cholesterol, Blood Pressure, Diabetes, and BMI and the role they play in your overall health.   Other: -Provides group and verbal instruction on various topics (see comments)    Knowledge Questionnaire Score:  Knowledge Questionnaire Score - 07/20/17 1618      Knowledge Questionnaire Score   Pre Score 8/10  reviewed with pt today       Core Components/Risk Factors/Patient Goals at Admission:     Personal Goals and Risk Factors at Admission - 07/20/17 1653      Core Components/Risk Factors/Patient Goals on Admission    Weight Management Yes   Intervention Weight Management: Develop a combined nutrition and exercise program designed to reach desired caloric intake, while maintaining appropriate intake of nutrient and fiber, sodium and fats, and appropriate energy expenditure required for the weight goal.;Weight Management: Provide education and appropriate resources to help participant work on and attain dietary goals.;Weight Management/Obesity: Establish reasonable short term and long term weight goals.   Admit Weight 215 lb (97.5 kg)   Goal Weight: Short Term 210 lb (95.3 kg)   Goal Weight: Long Term 150 lb (68 kg)   Expected Outcomes Understanding of distribution of calorie intake throughout the day with the consumption of 4-5 meals/snacks;Understanding recommendations for meals to include 15-35% energy as protein, 25-35% energy from fat, 35-60% energy from carbohydrates, less than '200mg'$  of dietary cholesterol, 20-35 gm of total fiber daily;Weight Loss: Understanding of general recommendations for a balanced deficit meal plan, which promotes 1-2 lb weight loss per week and includes a negative energy balance of [phone] kcal/d;Weight Maintenance: Understanding of the  daily nutrition guidelines, which includes 25-35% calories from fat, 7% or less cal from saturated fats, less than '200mg'$  cholesterol, less than 1.5gm of sodium, & 5 or more servings of fruits and vegetables daily;Short Term: Continue to assess and modify interventions until short term weight is achieved   Diabetes Yes   Intervention Provide education about signs/symptoms and action to take for hypo/hyperglycemia.;Provide education about proper nutrition, including hydration, and aerobic/resistive exercise prescription along with prescribed medications to achieve blood glucose in normal ranges: Fasting glucose 65-99 mg/dL   Expected Outcomes Short Term: Participant verbalizes understanding of the signs/symptoms and immediate care of hyper/hypoglycemia, proper foot care and importance of medication, aerobic/resistive exercise and nutrition plan for blood glucose control.;Long Term: Attainment of HbA1C < 7%.   Lipids Yes   Intervention Provide education and support for participant on nutrition & aerobic/resistive exercise along with prescribed medications to achieve LDL '70mg'$ , HDL >'40mg'$ .   Expected Outcomes Short Term: Participant states understanding of desired cholesterol values and is compliant with medications prescribed. Participant is following exercise prescription and nutrition guidelines.;Long Term: Cholesterol controlled with medications as prescribed, with individualized exercise RX and with personalized nutrition plan. Value goals: LDL < '70mg'$ , HDL > 40 mg.      Core Components/Risk Factors/Patient Goals Review:      Goals and Risk Factor Review    Row Name 08/17/17 1136             Core Components/Risk Factors/Patient Goals Review   Personal Goals Review Weight Management/Obesity       Review Weight loss was discussed with that patient. We discussed how physical activity and exercise along with dietary changes can aid in this weight loss goal. She was encouraged to make an appointment  with the dietician, which she did.        Expected Outcomes Short: Amali has a short term goal of losing 1-2 lbs per week and aiming towards 196 lbs( 10 lbs loss). Leliana has will meet with the dietician on 09/10/17 and will continue to consistantly come to  pulmonary rehab for exercise.  Long: Long term  weight goal is 150 lbs.           Core Components/Risk Factors/Patient Goals at Discharge (Final Review):      Goals and Risk Factor Review - 08/17/17 1136      Core Components/Risk Factors/Patient Goals Review   Personal Goals Review Weight Management/Obesity   Review Weight loss was discussed with that patient. We discussed how physical activity and exercise along with dietary changes can aid in this weight loss goal. She was encouraged to make an appointment with the dietician, which she did.    Expected Outcomes Short: Leyani has a short term goal of losing 1-2 lbs per week and aiming towards 196 lbs( 10 lbs loss). Amaira has will meet with the dietician on 09/10/17 and will continue to consistantly come to  pulmonary rehab for exercise.  Long: Long term weight goal is 150 lbs.       ITP Comments:     ITP Comments    Row Name 07/20/17 1623 07/27/17 0845 07/28/17 4996 08/17/17 1038 08/24/17 0811   ITP Comments Med eval completed.  Documentation for diagnosis can be found in [place] notes that will be sent to medical records. 30 day review completed ITP sent to Dr. Ramonita Lab for Dr. Emily Filbert Director of [place]. Continue with ITP unless changes are made by physician. Patient has not yet had first day of exercise  Ms. Interrante called to say she cannot start until Monday August 20th due to her mom needing a sitter while her husband is away. Patient had her first day of exercise. She felt a little short of breath post exercise and took her albuterol MDI. Patient recovered and continued to exercise. 30 day review completed. ITP sent to Dr. Emily Filbert Director of [place]. Continue with ITP unless  changes are made by physician.     [place] Name 09/09/17 1207 09/21/17 0811         ITP Comments Gracianna has not attended since 8/22.   30 day review completed. ITP sent to Dr. Emily Filbert Director of [place]. Continue with ITP unless changes are made by physician.           Comments: 30 day Note

## 2017-09-23 ENCOUNTER — Telehealth: Payer: Self-pay

## 2017-09-23 DIAGNOSIS — J4550 Severe persistent asthma, uncomplicated: Secondary | ICD-10-CM

## 2017-09-23 NOTE — Telephone Encounter (Signed)
Called to see when and If Sandra Fischer would be able to resume [facility]. Message left.

## 2017-09-23 NOTE — Telephone Encounter (Signed)
Sandra Fischer called to say she has been busy with her mother and plans to return Monday.

## 2017-09-28 ENCOUNTER — Encounter: Payer: Non-veteran care | Attending: Internal Medicine

## 2017-09-28 DIAGNOSIS — J4550 Severe persistent asthma, uncomplicated: Secondary | ICD-10-CM | POA: Insufficient documentation

## 2017-09-28 DIAGNOSIS — Z79899 Other long term (current) drug therapy: Secondary | ICD-10-CM | POA: Insufficient documentation

## 2017-09-28 DIAGNOSIS — I10 Essential (primary) hypertension: Secondary | ICD-10-CM | POA: Insufficient documentation

## 2017-09-28 DIAGNOSIS — M069 Rheumatoid arthritis, unspecified: Secondary | ICD-10-CM | POA: Insufficient documentation

## 2017-09-28 DIAGNOSIS — Z7951 Long term (current) use of inhaled steroids: Secondary | ICD-10-CM | POA: Insufficient documentation

## 2017-09-28 DIAGNOSIS — Z7982 Long term (current) use of aspirin: Secondary | ICD-10-CM | POA: Insufficient documentation

## 2017-09-30 DIAGNOSIS — J4550 Severe persistent asthma, uncomplicated: Secondary | ICD-10-CM | POA: Diagnosis present

## 2017-09-30 DIAGNOSIS — I10 Essential (primary) hypertension: Secondary | ICD-10-CM | POA: Diagnosis not present

## 2017-09-30 DIAGNOSIS — M069 Rheumatoid arthritis, unspecified: Secondary | ICD-10-CM | POA: Diagnosis not present

## 2017-09-30 DIAGNOSIS — Z7982 Long term (current) use of aspirin: Secondary | ICD-10-CM | POA: Diagnosis not present

## 2017-09-30 DIAGNOSIS — Z79899 Other long term (current) drug therapy: Secondary | ICD-10-CM | POA: Diagnosis not present

## 2017-09-30 DIAGNOSIS — Z7951 Long term (current) use of inhaled steroids: Secondary | ICD-10-CM | POA: Diagnosis not present

## 2017-09-30 LAB — GLUCOSE, CAPILLARY
Glucose-Capillary: 142 mg/dL — ABNORMAL HIGH (ref 65–99)
Glucose-Capillary: 124 mg/dL — ABNORMAL HIGH (ref 65–99)

## 2017-09-30 NOTE — Progress Notes (Signed)
Daily Session Note  Patient Details  Name: Ruchy Wildrick MRN: [id number] Date of Birth: 1951/09/03 Referring Provider:     Pulmonary Rehab from 07/20/2017 in [facility] Cardiac and Pulmonary Rehab  Referring Provider  [facility]      Encounter Date: 09/30/2017  Check In:     Session Check In - 09/30/17 1026      Check-In   Location [facility]-Cardiac & Pulmonary Rehab   Staff Present [name], MA, ACSM RCEP, Exercise Physiologist;Amanda Oletta Darter, BA, ACSM CEP, Exercise Physiologist;Natasja Niday Flavia Shipper   Supervising physician immediately available to respond to emergencies [facility] immediately available ER MD   Physician(s) Dr. Clearnce Hasten and Jimmye Norman   Medication changes reported     No   Fall or balance concerns reported    No   Warm-up and Cool-down Performed as group-led instruction   Resistance Training Performed Yes   VAD Patient? No     Pain Assessment   Currently in Pain? No/denies   Multiple Pain Sites No         History  Smoking Status  . Never Smoker  Smokeless Tobacco  . Never Used    Goals Met:  Exercise tolerated well No report of cardiac concerns or symptoms Strength training completed today  Goals Unmet:  Not Applicable  Comments: Pt able to follow exercise prescription today without complaint.  Will continue to monitor for progression.   Dr. Emily Filbert is Medical Director for [place] and [facility] Pulmonary Rehabilitation.

## 2017-10-02 ENCOUNTER — Encounter: Payer: Non-veteran care | Admitting: *Deleted

## 2017-10-02 DIAGNOSIS — J4550 Severe persistent asthma, uncomplicated: Secondary | ICD-10-CM | POA: Diagnosis not present

## 2017-10-02 NOTE — Progress Notes (Signed)
Daily Session Note  Patient Details  Name: Sandra Fischer MRN: [id number] Date of Birth: 10-Apr-1951 Referring Provider:     Pulmonary Rehab from 07/20/2017 in [facility] Cardiac and Pulmonary Rehab  Referring Provider  [facility]      Encounter Date: 10/02/2017  Check In:     Session Check In - 10/02/17 1016      Check-In   Location [facility]-Cardiac & Pulmonary Rehab   Staff Present Gerlene Burdock, RN, Vickki Hearing, BA, ACSM CEP, Exercise Physiologist;Joseph Flavia Shipper   Supervising physician immediately available to respond to emergencies [facility] immediately available ER MD   Physician(s) Dr. Corky Downs and Dr. Reita Cliche   Medication changes reported     No   Fall or balance concerns reported    No   Tobacco Cessation No Change   Warm-up and Cool-down Performed as group-led instruction   Resistance Training Performed Yes   VAD Patient? No     Pain Assessment   Currently in Pain? No/denies         History  Smoking Status  . Never Smoker  Smokeless Tobacco  . Never Used    Goals Met:  Proper associated with RPD/PD & O2 Sat Independence with exercise equipment Exercise tolerated well Strength training completed today  Goals Unmet:  Not Applicable  Comments: Pt able to follow exercise prescription today without complaint.  Will continue to monitor for progression.     Dr. Emily Filbert is Medical Director for [place] and [facility] Pulmonary Rehabilitation.

## 2017-10-05 DIAGNOSIS — J4550 Severe persistent asthma, uncomplicated: Secondary | ICD-10-CM | POA: Diagnosis not present

## 2017-10-05 NOTE — Progress Notes (Signed)
Daily Session Note  Patient Details  Name: Sandra Fischer MRN: [id number] Date of Birth: 10-22-1951 Referring Provider:     Pulmonary Rehab from 07/20/2017 in [facility] Cardiac and Pulmonary Rehab  Referring Provider  [facility]      Encounter Date: 10/05/2017  Check In:     Session Check In - 10/05/17 1006      Check-In   Location [facility]-Cardiac & Pulmonary Rehab   Staff Present Nada Maclachlan, BA, ACSM CEP, Exercise Physiologist;Kelly Amedeo Plenty, BS, ACSM CEP, Exercise Physiologist;Christeena Krogh Flavia Shipper   Supervising physician immediately available to respond to emergencies [facility] immediately available ER MD   Physician(s) Dr. Jimmye Norman and [facility]   Medication changes reported     No   Fall or balance concerns reported    No   Warm-up and Cool-down Performed as group-led instruction   Resistance Training Performed Yes   VAD Patient? No     Pain Assessment   Currently in Pain? No/denies   Multiple Pain Sites No         History  Smoking Status  . Never Smoker  Smokeless Tobacco  . Never Used    Goals Met:  Proper associated with RPD/PD & O2 Sat Independence with exercise equipment Using PLB without cueing & demonstrates good technique Exercise tolerated well No report of cardiac concerns or symptoms Strength training completed today  Goals Unmet:  Not Applicable  Comments: Pt able to follow exercise prescription today without complaint.  Will continue to monitor for progression.    Dr. Emily Filbert is Medical Director for [place] and [facility] Pulmonary Rehabilitation.

## 2017-10-07 DIAGNOSIS — J4550 Severe persistent asthma, uncomplicated: Secondary | ICD-10-CM | POA: Diagnosis not present

## 2017-10-07 NOTE — Progress Notes (Signed)
Daily Session Note  Patient Details  Name: Sandra Fischer MRN: [id number] Date of Birth: 16-Mar-1951 Referring Provider:     Pulmonary Rehab from 07/20/2017 in [place] and Pulmonary Rehab  Referring Provider  [facility]      Encounter Date: 10/07/2017  Check In:     Session Check In - 10/07/17 0956      Check-In   Location [facility]-Cardiac & Pulmonary Rehab   Staff Present [name], MA, ACSM RCEP, Exercise Physiologist;Amanda Oletta Darter, BA, ACSM CEP, Exercise Physiologist;Aundra Pung Flavia Shipper   Supervising physician immediately available to respond to emergencies [facility] immediately available ER MD   Physician(s) Dr. Jimmye Norman and Cinda Quest   Medication changes reported     No   Fall or balance concerns reported    No   Warm-up and Cool-down Performed as group-led instruction   Resistance Training Performed Yes   VAD Patient? No     Pain Assessment   Currently in Pain? No/denies   Multiple Pain Sites No         History  Smoking Status  . Never Smoker  Smokeless Tobacco  . Never Used    Goals Met:  Independence with exercise equipment Exercise tolerated well No report of cardiac concerns or symptoms Strength training completed today  Goals Unmet:  Not Applicable  Comments: Pt able to follow exercise prescription today without complaint.  Will continue to monitor for progression.   Dr. Emily Filbert is Medical Director for [facility] and [facility].

## 2017-10-09 DIAGNOSIS — J4550 Severe persistent asthma, uncomplicated: Secondary | ICD-10-CM

## 2017-10-09 NOTE — Progress Notes (Signed)
Daily Session Note  Patient Details  Name: Sandra Fischer MRN: [id number] Date of Birth: 03/07/1951 Referring Provider:     Pulmonary Rehab from 07/20/2017 in [place] and Pulmonary Rehab  Referring Provider  [facility]      Encounter Date: 10/09/2017  Check In:     Session Check In - 10/09/17 1009      Check-In   Location [facility]-Cardiac & Pulmonary Rehab   Staff Present Nada Maclachlan, BA, ACSM CEP, Exercise Physiologist;Arlisha Patalano Alcus Dad, RN BSN   Supervising physician immediately available to respond to emergencies [facility] immediately available ER MD   Physician(s) Dr. Cinda Quest and Clearnce Hasten   Medication changes reported     No   Fall or balance concerns reported    No   Warm-up and Cool-down Performed as group-led instruction   Resistance Training Performed Yes   VAD Patient? No     Pain Assessment   Currently in Pain? No/denies   Multiple Pain Sites No         History  Smoking Status  . Never Smoker  Smokeless Tobacco  . Never Used    Goals Met:  Independence with exercise equipment Exercise tolerated well No report of cardiac concerns or symptoms Strength training completed today  Goals Unmet:  Not Applicable  Comments: Reviewed home exercise with pt today.  Pt plans to walk an extra day at home for exercise.  Reviewed THR, pulse, RPE, sign and symptoms, NTG use, and when to call 911 or MD.  Also discussed weather considerations and indoor options.  Pt voiced understanding.Pt able to follow exercise prescription today without complaint.  Will continue to monitor for progression.   Dr. Emily Filbert is Medical Director for [place] and [facility] Pulmonary Rehabilitation.

## 2017-10-16 DIAGNOSIS — J4550 Severe persistent asthma, uncomplicated: Secondary | ICD-10-CM

## 2017-10-16 NOTE — Progress Notes (Signed)
Daily Session Note  Patient Details  Name: Sandra Fischer MRN: [id number] Date of Birth: October 31, 1951 Referring Provider:     Pulmonary Rehab from 07/20/2017 in [place] and Pulmonary Rehab  Referring Provider  [facility]      Encounter Date: 10/16/2017  Check In:     Session Check In - 10/16/17 1004      Check-In   Location [facility]-Cardiac & Pulmonary Rehab   Staff Present Nada Maclachlan, BA, ACSM CEP, Exercise Physiologist;Tresten Pantoja Alcus Dad, RN BSN   Supervising physician immediately available to respond to emergencies [facility] immediately available ER MD   Physician(s) Dr. Kerman Passey and Quentin Cornwall   Medication changes reported     No   Fall or balance concerns reported    No   Warm-up and Cool-down Performed as group-led instruction   Resistance Training Performed Yes   VAD Patient? No     Pain Assessment   Currently in Pain? No/denies   Multiple Pain Sites No         History  Smoking Status  . Never Smoker  Smokeless Tobacco  . Never Used    Goals Met:  Independence with exercise equipment Exercise tolerated well No report of cardiac concerns or symptoms Strength training completed today  Goals Unmet:  Not Applicable  Comments: Pt able to follow exercise prescription today without complaint.  Will continue to monitor for progression.   Dr. Emily Filbert is Medical Director for [place] and [facility] Pulmonary Rehabilitation.

## 2017-10-19 DIAGNOSIS — J4550 Severe persistent asthma, uncomplicated: Secondary | ICD-10-CM

## 2017-10-19 NOTE — Progress Notes (Signed)
Pulmonary Individual Treatment Plan  Patient Details  Name: Sandra Fischer MRN: [id number] Date of Birth: 11/17/1951 Referring Provider:     Pulmonary Rehab from 07/20/2017 in [facility]  Referring Provider  [facility]      Initial Encounter Date:    Pulmonary Rehab from 07/20/2017 in [facility] Cardiac and Pulmonary Rehab  Date  07/20/17  Referring Provider  [facility]      Visit Diagnosis: Severe persistent asthma without complication  Patient's Home Medications on Admission:  Current Outpatient Prescriptions:  .  Abatacept 125 MG/ML SOAJ, Take 750 mg by mouth., Disp: , Rfl:  .  albuterol (ACCUNEB) 1.25 MG/3ML nebulizer solution, Take 1 ampule by nebulization every 6 (six) hours as needed for wheezing., Disp: , Rfl:  .  albuterol (PROVENTIL HFA;VENTOLIN HFA) 108 (90 Base) MCG/ACT inhaler, Inhale into the lungs every 6 (six) hours as needed for wheezing or shortness of breath., Disp: , Rfl:  .  albuterol (PROVENTIL HFA;VENTOLIN HFA) 108 (90 Base) MCG/ACT inhaler, Inhale 2 puffs into the lungs every 6 (six) hours as needed for wheezing or shortness of breath., Disp: 1 Inhaler, Rfl: 0 .  aspirin EC 81 MG tablet, Take 81 mg by mouth daily., Disp: , Rfl:  .  azithromycin (ZITHROMAX) 500 MG tablet, Take 1 tablet (500 mg total) by mouth daily., Disp: 4 tablet, Rfl: 0 .  budesonide-formoterol (SYMBICORT) 160-4.5 MCG/ACT inhaler, Inhale 2 puffs into the lungs 2 (two) times daily., Disp: , Rfl:  .  cetirizine (ZYRTEC) 10 MG tablet, Take 10 mg by mouth daily., Disp: , Rfl:  .  desloratadine (CLARINEX) 5 MG tablet, Take 5 mg by mouth daily., Disp: , Rfl:  .  ferrous sulfate 325 (65 FE) MG tablet, Take 325 mg by mouth daily with breakfast., Disp: , Rfl:  .  fluconazole (DIFLUCAN) 150 MG tablet, Take 1 tablet (150 mg total) by mouth once. (Patient not taking: Reported on 08/23/2016), Disp: 2 tablet, Rfl: 1 .  guaiFENesin-dextromethorphan (ROBITUSSIN DM) 100-10 MG/5ML syrup, Take 5  mLs by mouth every 4 (four) hours as needed for cough., Disp: 118 mL, Rfl: 0 .  ipratropium (ATROVENT) 0.02 % nebulizer solution, Take 0.5 mg by nebulization 4 (four) times daily., Disp: , Rfl:  .  levothyroxine (SYNTHROID, LEVOTHROID) 88 MCG tablet, Take 88 mcg by mouth daily before breakfast., Disp: , Rfl:  .  lisinopril (PRINIVIL,ZESTRIL) 40 MG tablet, Take 40 mg by mouth daily., Disp: , Rfl:  .  montelukast (SINGULAIR) 10 MG tablet, Take 10 mg by mouth at bedtime., Disp: , Rfl:  .  omeprazole (PRILOSEC) 10 MG capsule, Take 20 mg by mouth daily., Disp: , Rfl:  .  pravastatin (PRAVACHOL) 40 MG tablet, Take 40 mg by mouth daily., Disp: , Rfl:  .  predniSONE (DELTASONE) 10 MG tablet, Take 1 tablet (10 mg total) by mouth daily., Disp: 42 tablet, Rfl: 0 .  Prenatal Vit-Fe Fumarate-FA (MULTIVITAMIN-PRENATAL) 27-0.8 MG TABS tablet, Take 1 tablet by mouth daily at 12 noon., Disp: , Rfl:  .  sulfamethoxazole-trimethoprim (BACTRIM DS,SEPTRA DS) 800-160 MG tablet, Take 1 tablet by mouth 2 (two) times daily., Disp: 14 tablet, Rfl: 0 .  traMADol (ULTRAM) 50 MG tablet, Take 100 mg by mouth as needed (for rheumatoid arthritis)., Disp: , Rfl:  .  verapamil (CALAN) 120 MG tablet, Take 240 mg by mouth 2 (two) times daily., Disp: , Rfl:   Past Medical History: Past Medical History:  Diagnosis Date  . Asthma   .  Hypertension   . RA (rheumatoid arthritis) (HCC)     Tobacco Use: History  Smoking Status  . Never Smoker  Smokeless Tobacco  . Never Used    Labs: Recent Review Flowsheet Data    There is no flowsheet data to display.       Pulmonary Assessment Scores:     Pulmonary Assessment Scores    Row Name 07/20/17 1619         ADL UCSD   ADL Phase Entry     SOB Score total 40     Rest 1     Walk 2     Stairs 4     Bath 1     Dress 2     Shop 3       mMRC Score   mMRC Score 4        Pulmonary Function Assessment:     Pulmonary Function Assessment - 07/20/17 1657       Pulmonary Function Tests   FVC% 62 %   FEV1% 39 %   FEV1/FVC Ratio 50      Exercise Target Goals:    Exercise Program Goal: Individual exercise prescription set with THRR, safety & activity barriers. Participant demonstrates ability to understand and report RPE using BORG scale, to self-measure pulse accurately, and to acknowledge the importance of the exercise prescription.  Exercise Prescription Goal: Starting with aerobic activity 30 plus minutes a day, 3 days per week for initial exercise prescription. Provide home exercise prescription and guidelines that participant acknowledges understanding prior to discharge.  Activity Barriers & Risk Stratification:     Activity Barriers & Cardiac Risk Stratification - 07/20/17 1613      Activity Barriers & Cardiac Risk Stratification   Activity Barriers Arthritis;Deconditioning;Muscular Weakness;Shortness of Breath;Joint Problems  Rhuematoid Arthritis, joints swell (especially ankles, hips, and knees0      6 Minute Walk:     6 Minute Walk    Row Name 07/20/17 1611         6 Minute Walk   Phase Initial     Distance 1122 feet     Walk Time 6 minutes     # of Rest Breaks 0     MPH 2.13     METS 2.4     RPE 13     Perceived Dyspnea  3     VO2 Peak 8.41     Symptoms Yes (comment)     Comments SOB, chest tightness with breathing heavier     Resting HR 75 bpm     Resting BP 138/64     Max Ex. HR 105 bpm     Max Ex. BP 134/56     2 Minute Post BP 130/60       Interval HR   Baseline HR (retired) 75     3 Minute HR 105     6 Minute HR 105     2 Minute Post HR 75     Interval Heart Rate? Yes  pulse oximeter did not pick up heart rate very well       Interval Oxygen   Interval Oxygen? Yes     Baseline Oxygen Saturation % 95 %     Resting Liters of Oxygen 0 L  Room Air     1 Minute Oxygen Saturation % 94 %     1 Minute Liters of Oxygen 0 L     2 Minute Oxygen Saturation % 93 %  2 Minute Liters of Oxygen 0 L     3  Minute Oxygen Saturation % 95 %     3 Minute Liters of Oxygen 0 L     4 Minute Oxygen Saturation % 95 %     4 Minute Liters of Oxygen 0 L     5 Minute Oxygen Saturation % 96 %     5 Minute Liters of Oxygen 0 L     6 Minute Oxygen Saturation % 96 %     6 Minute Liters of Oxygen 0 L     2 Minute Post Oxygen Saturation % 98 %     2 Minute Post Liters of Oxygen 0 L       Oxygen Initial Assessment:     Oxygen Initial Assessment - 07/20/17 1655      Home Oxygen   Home Oxygen Device None   Sleep Oxygen Prescription None   Home Exercise Oxygen Prescription None   Home at Rest Exercise Oxygen Prescription None     Initial 6 min Walk   Oxygen Used None     Intervention   Short Term Goals To learn and understand importance of monitoring SPO2 with pulse oximeter and demonstrate accurate use of the pulse oximeter.;To Learn and understand importance of maintaining oxygen saturations>88%;To learn and demonstrate proper purse lipped breathing techniques or other breathing techniques.;To learn and demonstrate proper use of respiratory medications   Long  Term Goals Maintenance of O2 saturations>88%;Compliance with respiratory medication;Demonstrates proper use of MDI's;Exhibits proper breathing techniques, such as purse lipped breathing or other method taught during program session;Verbalizes importance of monitoring SPO2 with pulse oximeter and return demonstration      Oxygen Re-Evaluation:     Oxygen Re-Evaluation    Row Name 08/17/17 1131 10/07/17 1059           Program Oxygen Prescription   Program Oxygen Prescription  - None        Home Oxygen   Home Oxygen Device  - None      Sleep Oxygen Prescription  - None      Home Exercise Oxygen Prescription  - None      Home at Rest Exercise Oxygen Prescription  - None        Goals/Expected Outcomes   Short Term Goals To learn and understand importance of monitoring SPO2 with pulse oximeter and demonstrate accurate use of the pulse  oximeter.;To learn and demonstrate proper purse lipped breathing techniques or other breathing techniques.;To Learn and understand importance of maintaining oxygen saturations>88%;To learn and demonstrate proper use of respiratory medications;To learn and exhibit compliance with exercise, home and travel O2 prescription To learn and understand importance of maintaining oxygen saturations>88%;To learn and demonstrate proper use of respiratory medications;To learn and demonstrate proper pursed lip breathing techniques or other breathing techniques.;To learn and understand importance of monitoring SPO2 with pulse oximeter and demonstrate accurate use of the pulse oximeter.      Long  Term Goals Exhibits proper breathing techniques, such as purse lipped breathing or other method taught during program session;Maintenance of O2 saturations>88%;Compliance with respiratory medication;Exhibits compliance with exercise, home and travel O2 prescription;Verbalizes importance of monitoring SPO2 with pulse oximeter and return demonstration Verbalizes importance of monitoring SPO2 with pulse oximeter and return demonstration;Exhibits proper breathing techniques, such as pursed lip breathing or other method taught during program session;Demonstrates proper use of MDI's;Compliance with respiratory medication;Maintenance of O2 saturations>88%      Comments On patient's first day of exercise  pursed lip breathing was discussed and demonstrated as well as instruction on using the pulse oximeter and proper oxygen saturations during rest and exercise. Patient demonstrated understanding of these concepts.  Charmain has to get a sleep study done. She states that her CPAP is old and has not worn it in awhile. She also needs to get a pulse oximeter to check her oxygen at home. Informed patient that she needs to keep her oxygen saturation above 88 percent. Wednesday needs to work on PLB outside of class and in class. She keeps a spacer with  her to take her albuterol when needed. Manjot also uses her daily maintinence inhaler as prescribed.      Goals/Expected Outcomes Short: Patient will use PLB techniques in pulmonary rehab to help control SOB and maintain acceptable oygen saturations. Long: Patient will independenly use respiratory medications and breathing techniques to manage SOB and oxygen saturations during daily life activities. at home and during pulmonary rehab.  Short: Obtain a pulse oximter. Long: Independently check oxygen at home.         Oxygen Discharge (Final Oxygen Re-Evaluation):     Oxygen Re-Evaluation - 10/07/17 1059      Program Oxygen Prescription   Program Oxygen Prescription None     Home Oxygen   Home Oxygen Device None   Sleep Oxygen Prescription None   Home Exercise Oxygen Prescription None   Home at Rest Exercise Oxygen Prescription None     Goals/Expected Outcomes   Short Term Goals To learn and understand importance of maintaining oxygen saturations>88%;To learn and demonstrate proper use of respiratory medications;To learn and demonstrate proper pursed lip breathing techniques or other breathing techniques.;To learn and understand importance of monitoring SPO2 with pulse oximeter and demonstrate accurate use of the pulse oximeter.   Long  Term Goals Verbalizes importance of monitoring SPO2 with pulse oximeter and return demonstration;Exhibits proper breathing techniques, such as pursed lip breathing or other method taught during program session;Demonstrates proper use of MDI's;Compliance with respiratory medication;Maintenance of O2 saturations>88%   Comments Amparo has to get a sleep study done. She states that her CPAP is old and has not worn it in awhile. She also needs to get a pulse oximeter to check her oxygen at home. Informed patient that she needs to keep her oxygen saturation above 88 percent. Klaryssa needs to work on PLB outside of class and in class. She keeps a spacer with her to take  her albuterol when needed. Makya also uses her daily maintinence inhaler as prescribed.   Goals/Expected Outcomes Short: Obtain a pulse oximter. Long: Independently check oxygen at home.      Initial Exercise Prescription:     Initial Exercise Prescription - 07/20/17 1600      Date of Initial Exercise RX and Referring Provider   Date 07/20/17   Referring Provider [facility]     Treadmill   MPH 1.8   Grade 0   Minutes 15   METs 2.38     NuStep   Level 1   SPM 80   Minutes 15   METs 2     REL-XR   Level 1   Speed 50   Minutes 15   METs 2     Prescription Details   Frequency (times per week) 3   Duration Progress to 45 minutes of aerobic exercise without signs/symptoms of physical distress     Intensity   THRR 40-80% of Max Heartrate 108-141   Ratings of Perceived Exertion 11-13  Perceived Dyspnea 0-4     Progression   Progression Continue to progress workloads to maintain intensity without signs/symptoms of physical distress.     Resistance Training   Training Prescription Yes   Weight 2 lbs   Reps 10-15      Perform Capillary Blood Glucose checks as needed.  Exercise Prescription Changes:     Exercise Prescription Changes    Row Name 08/26/17 1100 10/07/17 1200 10/09/17 1100         Response to Exercise   Blood Pressure (Admit) 126/70 164/74  -     Blood Pressure (Exercise) 148/92 156/74  -     Blood Pressure (Exit) 126/64 138/64  -     Heart Rate (Admit) 85 bpm 83 bpm  -     Heart Rate (Exercise) 148 bpm 138 bpm  -     Heart Rate (Exit) 86 bpm 89 bpm  -     Oxygen Saturation (Admit) 96 % 97 %  -     Oxygen Saturation (Exercise) 96 % 95 %  -     Oxygen Saturation (Exit) 95 % 93 %  -     Rating of Perceived Exertion (Exercise) 12 11  -     Perceived Dyspnea (Exercise) 1 1  -     Symptoms none none  -     Duration Progress to 45 minutes of aerobic exercise without signs/symptoms of physical distress Continue with 45 min of aerobic exercise  without signs/symptoms of physical distress.  -     Intensity THRR unchanged THRR unchanged  -       Progression   Progression Continue to progress workloads to maintain intensity without signs/symptoms of physical distress. Continue to progress workloads to maintain intensity without signs/symptoms of physical distress.  -     Average METs 2.25 2.6  -       Resistance Training   Training Prescription Yes Yes  -     Weight 2 lb 2 lb  -     Reps 10-15 10-15  -       Interval Training   Interval Training  - No  -       Treadmill   MPH 2 2.4  -     Grade 0 1  -     Minutes 15 15  -     METs 2.52 3.17  -       NuStep   Level  - 3  -     SPM  - 96  -     Minutes  - 15  -     METs  - 3  -       REL-XR   Level 1 1  -     Speed 53 60  -     Minutes 15 15  -     METs 2.1 1.6  -       Home Exercise Plan   Plans to continue exercise at  -  - Home (comment)  walking     Frequency  -  - Add 1 additional day to program exercise sessions.     Initial Home Exercises Provided  -  - 10/09/17        Exercise Comments:     Exercise Comments    Row Name 08/17/17 1130           Exercise Comments First full day of exercise!  Patient was oriented to gym and equipment  including functions, settings, policies, and procedures.  Patient's individual exercise prescription and treatment plan were reviewed.  All starting workloads were established based on the results of the 6 minute walk test done at initial orientation visit.  The plan for exercise progression was also introduced and progression will be customized based on patient's performance and goals          Exercise Goals and Review:     Exercise Goals    Row Name 07/20/17 1615             Exercise Goals   Increase Physical Activity Yes       Intervention Provide advice, education, support and counseling about physical activity/exercise needs.;Develop an individualized exercise prescription for aerobic and resistive training  based on initial evaluation findings, risk stratification, comorbidities and participant's personal goals.       Expected Outcomes Achievement of increased cardiorespiratory fitness and enhanced flexibility, muscular endurance and strength shown through measurements of functional capacity and personal statement of participant.       Increase Strength and Stamina Yes       Intervention Provide advice, education, support and counseling about physical activity/exercise needs.;Develop an individualized exercise prescription for aerobic and resistive training based on initial evaluation findings, risk stratification, comorbidities and participant's personal goals.       Expected Outcomes Achievement of increased cardiorespiratory fitness and enhanced flexibility, muscular endurance and strength shown through measurements of functional capacity and personal statement of participant.          Exercise Goals Re-Evaluation :     Exercise Goals Re-Evaluation    [place] Name 08/17/17 1141 08/26/17 1202 09/09/17 1208 10/07/17 1254 10/09/17 1140     Exercise Goal Re-Evaluation   Exercise Goals Review Increase Physical Activity;Increase Strenth and Stamina Increase Physical Activity  - Increase Physical Activity;Increase Strength and Stamina;Able to understand and use rate of perceived exertion (RPE) scale;Able to understand and use Dyspnea scale Increase Physical Activity;Increase Strength and Stamina;Knowledge and understanding of Target Heart Rate Range (THRR);Able to understand and use rate of perceived exertion (RPE) scale;Understanding of Exercise Prescription   Comments Zaria came to her first day of exercise today. She had been out for several week since her initail medical review.  Cataleya attended her first two sessions.  She tolerated exercise well.   Ailene has not attended since 8/22. Delayza is tolerating exercise well.  She has increased levels on all machines. Reviewed home exercise with pt today.  Pt  plans to walk an extra day at home for exercise.  Reviewed THR, pulse, RPE, sign and symptoms, NTG use, and when to call 911 or MD.  Also discussed weather considerations and indoor options.  Pt voiced understanding   Expected Outcomes Short: Alessia will be consistant with her attendance to pulmonary rehab. Long: With consistant attendance and exercise Nezzie will gain strength and stamina to progress with her exercise in rehab and other ADL's.  Short - Jonel will attend LW regularly.  Long - Chihiro will improve overall fitness.  - Short - Eliora will continue to attend regularly.  Long - Kenzleigh will continue to improve overall fitness level. Short: add 1 extra day of walking to home exercise. Long: Maintain a home exercise routine and add additional days of walking      Discharge Exercise Prescription (Final Exercise Prescription Changes):     Exercise Prescription Changes - 10/09/17 1100      Home Exercise Plan   Plans to continue exercise at Home (comment)  walking   Frequency Add 1 additional day to program exercise sessions.   Initial Home Exercises Provided 10/09/17      Nutrition:  Target Goals: Understanding of nutrition guidelines, daily intake of sodium <1527m, cholesterol <2043m calories 30% from fat and 7% or less from saturated fats, daily to have 5 or more servings of fruits and vegetables.  Biometrics:     Pre Biometrics - 07/20/17 1615      Pre Biometrics   Height 5' 2.2" (1.58 m)   Weight 211 lb 3.2 oz (95.8 kg)   Waist Circumference 41 inches   Hip Circumference 43.5 inches   Waist to Hip Ratio 0.94 %   BMI (Calculated) 38.5       Nutrition Therapy Plan and Nutrition Goals:     Nutrition Therapy & Goals - 07/20/17 1659      Intervention Plan   Intervention Prescribe, educate and counsel regarding individualized specific dietary modifications aiming towards targeted core components such as weight, hypertension, lipid management, diabetes, heart failure  and other comorbidities.;Nutrition handout(s) given to patient.   Expected Outcomes Short Term Goal: Understand basic principles of dietary content, such as calories, fat, sodium, cholesterol and nutrients.;Short Term Goal: A plan has been developed with personal nutrition goals set during dietitian appointment.;Long Term Goal: Adherence to prescribed nutrition plan.      Nutrition Discharge: Rate Your Plate Scores:   Nutrition Goals Re-Evaluation:     Nutrition Goals Re-Evaluation    RoBe[place]umontame 08/17/17 1139 10/09/17 1415           Goals   Current Weight 206 lb (93.4 kg) 201 lb (91.2 kg)      Nutrition Goal Has a scheduled appointment to meet with the dietician on 09/10/17 to set more specific nutrition goal.  Lose weight and adhere to a healthier diet.      Comment  - BrJanitzatates she has been eating more vegetables and salad. She has been eating less red meat. She met with the dietician and has a good outlook for what to eat.      Expected Outcome  - Short: lose weight by eating healthy. Long: Adhere to a diet plan to maintain weight loss.         Nutrition Goals Discharge (Final Nutrition Goals Re-Evaluation):     Nutrition Goals Re-Evaluation - 10/09/17 1415      Goals   Current Weight 201 lb (91.2 kg)   Nutrition Goal Lose weight and adhere to a healthier diet.   Comment BrErnestenetates she has been eating more vegetables and salad. She has been eating less red meat. She met with the dietician and has a good outlook for what to eat.   Expected Outcome Short: lose weight by eating healthy. Long: Adhere to a diet plan to maintain weight loss.      Psychosocial: Target Goals: Acknowledge presence or absence of significant depression and/or stress, maximize coping skills, provide positive support system. Participant is able to verbalize types and ability to use techniques and skills needed for reducing stress and depression.   Initial Review & Psychosocial Screening:      Initial Psych Review & Screening - 07/20/17 1700      Initial Review   Current issues with None Identified     Family Dynamics   Good Support System? Yes     Barriers   Psychosocial barriers to participate in program The patient should benefit from training in stress management and relaxation.  Screening Interventions   Interventions Program counselor consult      Quality of Life Scores:     Quality of Life - 07/20/17 1620      Quality of Life Scores   Health/Function Pre 15.25 %   Socioeconomic Pre 19.36 %   Psych/Spiritual Pre 19.79 %   Family Pre 21.25 %   GLOBAL Pre 17.74 %      PHQ-9: Recent Review Flowsheet Data    Depression screen [facility] 2/9 09/30/2017 07/20/2017   Decreased Interest 1 1   Down, Depressed, Hopeless 0 0   PHQ - 2 Score 1 1   Altered sleeping 1 2   Tired, decreased energy 2 3   Change in appetite 1 2   Feeling bad or failure about yourself  1 1   Trouble concentrating 1 3   Moving slowly or fidgety/restless 0 0   Suicidal thoughts 0 0   PHQ-9 Score 7 12   Difficult doing work/chores Somewhat difficult Somewhat difficult     Interpretation of Total Score  Total Score Depression Severity:  1-4 = Minimal depression, 5-9 = Mild depression, 10-14 = Moderate depression, 15-19 = Moderately severe depression, 20-27 = Severe depression   Psychosocial Evaluation and Intervention:     Psychosocial Evaluation - 08/17/17 1028      Psychosocial Evaluation & Interventions   Interventions Encouraged to exercise with the program and follow exercise prescription;Relaxation education;Stress management education   Comments Counselor met with Ms. Sunday Corn today Hassan Rowan) for initial psychosocial evaluation.  She is a 66 year old who has asthma and many other health issues, including RA; Fibromyalgia; HBP and High Cholesterol; obesity and she had her thyroid removed approximately 10 years ago.  Evalin has a strong support system with a spouse of 22 years and she  is actively involved in her local church.  Kirstie has chronic sleep problems with maybe 4 nights of 4-6 hours per week on average.  She denies a history of depression or anxiety or any current symptoms and states she is typically in a positive mood most of the time.  Khushboo has multiple stressors with her health; caring for her mother with dementia and finances.  She has goals to lose weight and increase her stamina and strength.  Majestic will be followed by staff throughout the course of this program.     Expected Outcomes Bibi will benefit from consistent exercise to achieve her stated goals.  She will be seeing the dietician who will help her address her weight loss goals.  Attallah will also benefit from the educational and psychoeducational components of this program to help her manage her medical issues and cope better with her stressors.     Continue Psychosocial Services  Follow up required by staff      Psychosocial Re-Evaluation:     Psychosocial Re-Evaluation    Weber Name 09/30/17 1129             Psychosocial Re-Evaluation   Current issues with Current Stress Concerns;Current Sleep Concerns       Comments Counselor follow up with Hassan Rowan today reporting her sleep has improved slightly since she came into the program. 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  Ingri has been out of class for several weeks due to her mother's health; which resulted in her mother going into a nursing home during that time.  Jacque reports taking that  opportunity to focus on her personal self-care and attended to much needed medical appointments.  Also she has been able to have additional help for her mother in place.  This reduces her stress.  Teiana reports exercising also helps reduce her stress and she is happy to be back in class.  She received a phone call from a Dr.'s office while meeting with counselor and  became a little stressed about that interruption.  Counselor encouraged Dagmar to turn her phone off during her self-care time - like this class.  Her spouse knows how to care for Chelbi's mother and she agreed this would help her focus more and enjoy the class more.  Mehreen states finances continue to be a stressor but her spouse may be getting disability soon and that will be helpful.  Counselor commended Akayla for her progress made and commitment to exercise and positive self-care.        Expected Outcomes Akacia will continue to exercise consistently.  She will begin to set better limits on her time in class by turning her phone off to decrease her stress and improve her self-care.  She will need to meet with the dietician since she missed that appointment while out with her mother.         Continue Psychosocial Services  Follow up required by staff         Initial Review   Source of Stress Concerns Family;Financial          Psychosocial Discharge (Final Psychosocial Re-Evaluation):     Psychosocial Re-Evaluation - 09/30/17 1129      Psychosocial Re-Evaluation   Current issues with Current Stress Concerns;Current Sleep Concerns   Comments Counselor follow up with Hassan Rowan today reporting her sleep has improved slightly since she came into the program. 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  Supriya has been out of class for several weeks due to her mother's health; which resulted in her mother going into a nursing home during that time.  Kenadie reports taking that opportunity to focus on her personal self-care and attended to much needed medical appointments.  Also she has been able to have additional help for her mother in place.  This reduces her stress.  Casey reports exercising also helps reduce her stress and she is happy to be back in class.  She received a phone call from a Dr.'s office while  meeting with counselor and became a little stressed about that interruption.  Counselor encouraged Carlesha to turn her phone off during her self-care time - like this class.  Her spouse knows how to care for Sharna's mother and she agreed this would help her focus more and enjoy the class more.  Kitara states finances continue to be a stressor but her spouse may be getting disability soon and that will be helpful.  Counselor commended Autumn for her progress made and commitment to exercise and positive self-care.    Expected Outcomes Allia will continue to exercise consistently.  She will begin to set better limits on her time in class by turning her phone off to decrease her stress and improve her self-care.  She will need to meet with the dietician since she missed that appointment while out with her mother.     Continue Psychosocial Services  Follow up required by staff     Initial Review   Source of Stress Concerns Family;Financial  Education: Education Goals: Education classes will be provided on a weekly basis, covering required topics. Participant will state understanding/return demonstration of topics presented.  Learning Barriers/Preferences:     Learning Barriers/Preferences - 07/20/17 1656      Learning Barriers/Preferences   Learning Barriers None   Learning Preferences None      Education Topics: Initial Evaluation Education: - Verbal, written and demonstration of respiratory meds, RPE/PD scales, oximetry and breathing techniques. Instruction on use of nebulizers and MDIs: cleaning and proper use, rinsing mouth with steroid doses and importance of monitoring MDI activations.   Pulmonary Rehab from 10/09/2017 in [facility] Cardiac and Pulmonary Rehab  Date  07/20/17  Educator  [facility]  Instruction Review Code (retired)  2- meets goals/outcomes  Instruction Review Code  1- IT trainer Nutrition Guidelines/Fats and Fiber: -Group instruction provided by  verbal, written material, models and posters to present the general guidelines for heart healthy nutrition. Gives an explanation and review of dietary fats and fiber.   Pulmonary Rehab from 10/09/2017 in [facility] Cardiac and Pulmonary Rehab  Date  08/17/17  Educator  CR      Controlling Sodium/Reading Food Labels: -Group verbal and written material supporting the discussion of sodium use in heart healthy nutrition. Review and explanation with models, verbal and written materials for utilization of the food label.   Exercise Physiology & Risk Factors: - Group verbal and written instruction with models to review the exercise physiology of the cardiovascular system and associated critical values. Details cardiovascular disease risk factors and the goals associated with each risk factor.   Pulmonary Rehab from 10/09/2017 in [facility] Cardiac and Pulmonary Rehab  Date  10/02/17  Educator  Nada Maclachlan, EP  Instruction Review Code  1- Verbalizes Understanding      Aerobic Exercise & Resistance Training: - Gives group verbal and written discussion on the health impact of inactivity. On the components of aerobic and resistive training programs and the benefits of this training and how to safely progress through these programs.   Flexibility, Balance, General Exercise Guidelines: - Provides group verbal and written instruction on the benefits of flexibility and balance training programs. Provides general exercise guidelines with specific guidelines to those with heart or lung disease. Demonstration and skill practice provided.   Stress Management: - Provides group verbal and written instruction about the health risks of elevated stress, cause of high stress, and healthy ways to reduce stress.   Depression: - Provides group verbal and written instruction on the correlation between heart/lung disease and depressed mood, treatment options, and the stigmas associated with seeking treatment.   Pulmonary  Rehab from 10/09/2017 in [facility] Cardiac and Pulmonary Rehab  Date  08/19/17  Educator  [facility]  Instruction Review Code (retired)  2- meets goals/outcomes  Instruction Review Code  1- Science writer Understanding      Exercise & Equipment Safety: - Individual verbal instruction and demonstration of equipment use and safety with use of the equipment.   Pulmonary Rehab from 10/09/2017 in [facility] Cardiac and Pulmonary Rehab  Date  08/17/17  Educator  [facility]  Instruction Review Code  1- Verbalizes Understanding      Infection Prevention: - Provides verbal and written material to individual with discussion of infection control including proper hand washing and proper equipment cleaning during exercise session.   Pulmonary Rehab from 10/09/2017 in [facility] Cardiac and Pulmonary Rehab  Date  07/20/17  Educator  [facility]  Instruction Review Code  1- Verbalizes Understanding  Falls Prevention: - Provides verbal and written material to individual with discussion of falls prevention and safety.   Pulmonary Rehab from 10/09/2017 in [facility] Cardiac and Pulmonary Rehab  Date  07/20/17  Educator  [facility]  Instruction Review Code (retired)  2- meets goals/outcomes  Instruction Review Code  1- Science writer Understanding      Diabetes: - Individual verbal and written instruction to review signs/symptoms of diabetes, desired ranges of glucose level fasting, after meals and with exercise. Advice that pre and post exercise glucose checks will be done for 3 sessions at entry of program.   Pulmonary Rehab from 10/09/2017 in [facility] Cardiac and Pulmonary Rehab  Date  07/20/17  Educator  [facility]  Instruction Review Code  1- Verbalizes Understanding      Chronic Lung Diseases: - Group verbal and written instruction to review new updates, new respiratory medications, new advancements in procedures and treatments. Provide informative websites and "800" numbers of self-education.   Pulmonary Rehab from 10/09/2017 in [facility] Cardiac and  Pulmonary Rehab  Date  09/30/17  Educator  [facility]  Instruction Review Code  1- Verbalizes Understanding      Lung Procedures: - Group verbal and written instruction to describe testing methods done to diagnose lung disease. Review the outcome of test results. Describe the treatment choices: Pulmonary Function Tests, ABGs and oximetry.   Energy Conservation: - Provide group verbal and written instruction for methods to conserve energy, plan and organize activities. Instruct on pacing techniques, use of adaptive equipment and posture/positioning to relieve shortness of breath.   Triggers: - Group verbal and written instruction to review types of environmental controls: home humidity, furnaces, filters, dust mite/pet prevention, HEPA vacuums. To discuss weather changes, air quality and the benefits of nasal washing.   Exacerbations: - Group verbal and written instruction to provide: warning signs, infection symptoms, calling MD promptly, preventive modes, and value of vaccinations. Review: effective airway clearance, coughing and/or vibration techniques. Create an Sports administrator.   Oxygen: - Individual and group verbal and written instruction on oxygen therapy. Includes supplement oxygen, available portable oxygen systems, continuous and intermittent flow rates, oxygen safety, concentrators, and Medicare reimbursement for oxygen.   Respiratory Medications: - Group verbal and written instruction to review medications for lung disease. Drug class, frequency, complications, importance of spacers, rinsing mouth after steroid MDI's, and proper cleaning methods for nebulizers.   AED/CPR: - Group verbal and written instruction with the use of models to demonstrate the basic use of the AED with the basic ABC's of resuscitation.   Breathing Retraining: - Provides individuals verbal and written instruction on purpose, frequency, and proper technique of diaphragmatic breathing and pursed-lipped  breathing. Applies individual practice skills.   Pulmonary Rehab from 10/09/2017 in [facility] Cardiac and Pulmonary Rehab  Date  08/17/17  Educator  [facility]  Instruction Review Code  1- Actuary and Physiology of the Lungs: - Group verbal and written instruction with the use of models to provide basic lung anatomy and physiology related to function, structure and complications of lung disease.   Anatomy & Physiology of the Heart: - Group verbal and written instruction and models provide basic cardiac anatomy and physiology, with the coronary electrical and arterial systems. Review of: AMI, Angina, Valve disease, Heart Failure, Cardiac Arrhythmia, Pacemakers, and the ICD.   Heart Failure: - Group verbal and written instruction on the basics of heart failure: signs/symptoms, treatments, explanation of ejection fraction, enlarged heart and cardiomyopathy.   Sleep Apnea: -  Individual verbal and written instruction to review Obstructive Sleep Apnea. Review of risk factors, methods for diagnosing and types of masks and machines for OSA.   Anxiety: - Provides group, verbal and written instruction on the correlation between heart/lung disease and anxiety, treatment options, and management of anxiety.   Relaxation: - Provides group, verbal and written instruction about the benefits of relaxation for patients with heart/lung disease. Also provides patients with examples of relaxation techniques.   Cardiac Medications: - Group verbal and written instruction to review commonly prescribed medications for heart disease. Reviews the medication, class of the drug, and side effects.   Know Your Numbers: -Group verbal and written instruction about important numbers in your health.  Review of Cholesterol, Blood Pressure, Diabetes, and BMI and the role they play in your overall health.   Pulmonary Rehab from 10/09/2017 in [facility] Cardiac and Pulmonary Rehab  Date  10/09/17   Educator  [facility]  Instruction Review Code  1- Verbalizes Understanding      Other: -Provides group and verbal instruction on various topics (see comments)    Knowledge Questionnaire Score:     Knowledge Questionnaire Score - 07/20/17 1618      Knowledge Questionnaire Score   Pre Score 8/10  reviewed with pt today       Core Components/Risk Factors/Patient Goals at Admission:     Personal Goals and Risk Factors at Admission - 07/20/17 1653      Core Components/Risk Factors/Patient Goals on Admission    Weight Management Yes   Intervention Weight Management: Develop a combined nutrition and exercise program designed to reach desired caloric intake, while maintaining appropriate intake of nutrient and fiber, sodium and fats, and appropriate energy expenditure required for the weight goal.;Weight Management: Provide education and appropriate resources to help participant work on and attain dietary goals.;Weight Management/Obesity: Establish reasonable short term and long term weight goals.   Admit Weight 215 lb (97.5 kg)   Goal Weight: Short Term 210 lb (95.3 kg)   Goal Weight: Long Term 150 lb (68 kg)   Expected Outcomes Understanding of distribution of calorie intake throughout the day with the consumption of 4-5 meals/snacks;Understanding recommendations for meals to include 15-35% energy as protein, 25-35% energy from fat, 35-60% energy from carbohydrates, less than 263m of dietary cholesterol, 20-35 gm of total fiber daily;Weight Loss: Understanding of general recommendations for a balanced deficit meal plan, which promotes 1-2 lb weight loss per week and includes a negative energy balance of [id number] kcal/d;Weight Maintenance: Understanding of the daily nutrition guidelines, which includes 25-35% calories from fat, 7% or less cal from saturated fats, less than 2084mcholesterol, less than 1.5gm of sodium, & 5 or more servings of fruits and vegetables daily;Short Term: Continue to  assess and modify interventions until short term weight is achieved   Diabetes Yes   Intervention Provide education about signs/symptoms and action to take for hypo/hyperglycemia.;Provide education about proper nutrition, including hydration, and aerobic/resistive exercise prescription along with prescribed medications to achieve blood glucose in normal ranges: Fasting glucose 65-99 mg/dL   Expected Outcomes Short Term: Participant verbalizes understanding of the signs/symptoms and immediate care of hyper/hypoglycemia, proper foot care and importance of medication, aerobic/resistive exercise and nutrition plan for blood glucose control.;Long Term: Attainment of HbA1C < 7%.   Lipids Yes   Intervention Provide education and support for participant on nutrition & aerobic/resistive exercise along with prescribed medications to achieve LDL <7038mHDL >10m22m Expected Outcomes Short Term: Participant states understanding of  desired cholesterol values and is compliant with medications prescribed. Participant is following exercise prescription and nutrition guidelines.;Long Term: Cholesterol controlled with medications as prescribed, with individualized exercise RX and with personalized nutrition plan. Value goals: LDL < 73m, HDL > 40 mg.      Core Components/Risk Factors/Patient Goals Review:      Goals and Risk Factor Review    Row Name 08/17/17 1136 10/16/17 1123           Core Components/Risk Factors/Patient Goals Review   Personal Goals Review Weight Management/Obesity Weight Management/Obesity;Improve shortness of breath with ADL's;Diabetes;Stress      Review Weight loss was discussed with that patient. We discussed how physical activity and exercise along with dietary changes can aid in this weight loss goal. She was encouraged to make an appointment with the dietician, which she did.  BAriyahas been dealing with her mother and her dementia. She has lost a little weight since the start of the  program. Some days are better than others with her breathing. She states when it gets cold she can feel it in her lungs. She also does not do well with hot weather.      Expected Outcomes Short: BLiamhas a short term goal of losing 1-2 lbs per week and aiming towards 196 lbs( 10 lbs loss). BMallyhas will meet with the dietician on 09/10/17 and will continue to consistantly come to  pulmonary rehab for exercise.  Long: Long term weight goal is 150 lbs.  Short: Attend class regularyly to improve ADL's. Long: Maintain routine exercise to improve ADL's independently.         Core Components/Risk Factors/Patient Goals at Discharge (Final Review):      Goals and Risk Factor Review - 10/16/17 1123      Core Components/Risk Factors/Patient Goals Review   Personal Goals Review Weight Management/Obesity;Improve shortness of breath with ADL's;Diabetes;Stress   Review BJoannhas been dealing with her mother and her dementia. She has lost a little weight since the start of the program. Some days are better than others with her breathing. She states when it gets cold she can feel it in her lungs. She also does not do well with hot weather.   Expected Outcomes Short: Attend class regularyly to improve ADL's. Long: Maintain routine exercise to improve ADL's independently.      ITP Comments:     ITP Comments    Row Name 07/20/17 1623 07/27/17 0845 07/28/17 0967508/20/18 1038 08/24/17 0811   ITP Comments Med eval completed.  [place]ation [place] be sent to medical records. 30 day review completed ITP sent to Dr. BRamonita Labfor Dr. MEmily FilbertDirector of [place] Continue with ITP unless changes are made by physician. Patient has not yet had first day of exercise  Ms. Nouri called to say she cannot start until Monday August 20th due to her mom needing a sitter while her husband is away. Patient had her first day of exercise. She felt a little short of breath post  exercise and took her albuterol MDI. Patient recovered and continued to exercise. 30 day review completed. ITP sent to Dr. MEmily FilbertDirector of [place] Continue with ITP unless changes are made by physician.     Row Name 09/09/17 1207 09/21/17 0811 09/23/17 1148 09/23/17 1411 10/19/17 0804   ITP Comments BKayanihas not attended since 8/22.   30 day review completed. ITP sent to Dr. MEmily FilbertDirector  of [facility]. Continue with ITP unless changes are made by physician.   Mylin has not attended since 08/19/17. Called to see when and If Trea would be able to resume [facility]. Message left. 30 day review completed. ITP sent to Dr. Emily Filbert Director of [place]. Continue with ITP unless changes are made by physician.        Comments: 30 day review

## 2017-10-19 NOTE — Progress Notes (Signed)
Daily Session Note  Patient Details  Name: Sandra Fischer MRN: [id number] Date of Birth: [birth year] Referring Provider:     Pulmonary Rehab from 07/20/2017 in [facility] Cardiac and Pulmonary Rehab  Referring Provider  [facility]      Encounter Date: 10/19/2017  Check In:     Session Check In - 10/19/17 1020      Check-In   Location [facility]-Cardiac & Pulmonary Rehab   Staff Present Nada Maclachlan, BA, ACSM CEP, Exercise Physiologist;Kelly Amedeo Plenty, BS, ACSM CEP, Exercise Physiologist;Joseph Flavia Shipper   Supervising physician immediately available to respond to emergencies [facility] immediately available ER MD   Physician(s) Dr. Kerman Passey and Mariea Clonts   Medication changes reported     No   Fall or balance concerns reported    No   Warm-up and Cool-down Performed as group-led instruction   Resistance Training Performed Yes   VAD Patient? No     Pain Assessment   Currently in Pain? No/denies   Multiple Pain Sites No         History  Smoking Status  . Never Smoker  Smokeless Tobacco  . Never Used    Goals Met:  Independence with exercise equipment Exercise tolerated well No report of cardiac concerns or symptoms Strength training completed today  Goals Unmet:  Not Applicable  Comments: Pt able to follow exercise prescription today without complaint.  Will continue to monitor for progression.   Dr. Emily Filbert is Medical Director for [place] and [facility] Pulmonary Rehabilitation.

## 2017-10-23 DIAGNOSIS — J4550 Severe persistent asthma, uncomplicated: Secondary | ICD-10-CM | POA: Diagnosis not present

## 2017-10-23 NOTE — Progress Notes (Signed)
Daily Session Note  Patient Details  Name: Sandra Fischer MRN: [id number] Date of Birth: 02/19/1951 Referring Provider:     Pulmonary Rehab from 07/20/2017 in [place] and Pulmonary Rehab  Referring Provider  [facility]      Encounter Date: 10/23/2017  Check In:     Session Check In - 10/23/17 1010      Check-In   Location [facility]-Cardiac & Pulmonary Rehab   Staff Present Nada Maclachlan, BA, ACSM CEP, Exercise Physiologist;Joseph Alcus Dad, RN BSN   Supervising physician immediately available to respond to emergencies [facility] immediately available ER MD   Physician(s) Discharge ITP sent and signed by Dr. Sabra Heck.  Discharge Summary routed to PCP and cardiologist.   Medication changes reported     No   Fall or balance concerns reported    No   Warm-up and Cool-down Performed as group-led instruction   Resistance Training Performed Yes   VAD Patient? No     Pain Assessment   Currently in Pain? No/denies         History  Smoking Status  . Never Smoker  Smokeless Tobacco  . Never Used    Goals Met:  Proper associated with RPD/PD & O2 Sat Independence with exercise equipment Exercise tolerated well No report of cardiac concerns or symptoms Strength training completed today  Goals Unmet:  Not Applicable  Comments: Pt able to follow exercise prescription today without complaint.  Will continue to monitor for progression.   Dr. Emily Filbert is Medical Director for [place] and [facility].

## 2017-10-26 DIAGNOSIS — J4550 Severe persistent asthma, uncomplicated: Secondary | ICD-10-CM | POA: Diagnosis not present

## 2017-10-26 NOTE — Progress Notes (Signed)
Daily Session Note  Patient Details  Name: Sandra Fischer MRN: [id number] Date of Birth: August 20, 1951 Referring Provider:     Pulmonary Rehab from 07/20/2017 in [facility] Cardiac and Pulmonary Rehab  Referring Provider  [facility]      Encounter Date: 10/26/2017  Check In:     Session Check In - 10/26/17 1006      Check-In   Staff Present Nada Maclachlan, BA, ACSM CEP, Exercise Physiologist;Kelly Amedeo Plenty, BS, ACSM CEP, Exercise Physiologist;Joseph Flavia Shipper   Supervising physician immediately available to respond to emergencies [facility] immediately available ER MD   Physician(s) Dr. Cinda Quest and Reita Cliche   Medication changes reported     No   Fall or balance concerns reported    No   Warm-up and Cool-down Performed as group-led instruction   Resistance Training Performed Yes   VAD Patient? No     Pain Assessment   Currently in Pain? No/denies         History  Smoking Status  . Never Smoker  Smokeless Tobacco  . Never Used    Goals Met:  Independence with exercise equipment Exercise tolerated well No report of cardiac concerns or symptoms Strength training completed today  Goals Unmet:  Not Applicable  Comments: Pt able to follow exercise prescription today without complaint.  Will continue to monitor for progression.   Dr. Emily Filbert is Medical Director for [place] and [facility]monary [facility].

## 2017-10-29 ENCOUNTER — Encounter: Payer: Self-pay | Admitting: Dietician

## 2017-10-30 ENCOUNTER — Encounter: Payer: Non-veteran care | Attending: Internal Medicine

## 2017-10-30 DIAGNOSIS — Z7951 Long term (current) use of inhaled steroids: Secondary | ICD-10-CM | POA: Insufficient documentation

## 2017-10-30 DIAGNOSIS — M069 Rheumatoid arthritis, unspecified: Secondary | ICD-10-CM | POA: Insufficient documentation

## 2017-10-30 DIAGNOSIS — J4550 Severe persistent asthma, uncomplicated: Secondary | ICD-10-CM | POA: Diagnosis not present

## 2017-10-30 DIAGNOSIS — I10 Essential (primary) hypertension: Secondary | ICD-10-CM | POA: Insufficient documentation

## 2017-10-30 DIAGNOSIS — Z7982 Long term (current) use of aspirin: Secondary | ICD-10-CM | POA: Diagnosis not present

## 2017-10-30 DIAGNOSIS — Z79899 Other long term (current) drug therapy: Secondary | ICD-10-CM | POA: Diagnosis not present

## 2017-10-30 NOTE — Progress Notes (Signed)
Daily Session Note  Patient Details  Name: Sandra Fischer MRN: [id number] Date of Birth: 05/18/1951 Referring Provider:     Pulmonary Rehab from 07/20/2017 in [facility]  Referring Provider  [facility]      Encounter Date: 10/30/2017  Check In:     Session Check In - 10/30/17 1008      Check-In   Location [facility]   Staff Present Amanda Sommer, BA, ACSM CEP, Exercise Physiologist;Joseph Hood RCP,RRT,BSRT   Supervising physician immediately available to respond to emergencies [facility] immediately available ER MD   Physician(s) Dr. Mcshane and Schaevitz   Medication changes reported     No   Fall or balance concerns reported    No   Warm-up and Cool-down Performed as group-led instruction   Resistance Training Performed Yes   VAD Patient? No     Pain Assessment   Currently in Pain? No/denies         History  Smoking Status  . Never Smoker  Smokeless Tobacco  . Never Used    Goals Met:  Independence with exercise equipment Exercise tolerated well No report of cardiac concerns or symptoms Strength training completed today  Goals Unmet:  Not Applicable  Comments: Pt able to follow exercise prescription today without complaint.  Will continue to monitor for progression.   Dr. Mark Miller is Medical Director for [facility] and [facility]. 

## 2017-11-06 DIAGNOSIS — J4550 Severe persistent asthma, uncomplicated: Secondary | ICD-10-CM | POA: Diagnosis not present

## 2017-11-06 NOTE — Progress Notes (Signed)
Daily Session Note  Patient Details  Name: Sandra Fischer MRN: [id number] Date of Birth: [birth year] Referring Provider:     Pulmonary Rehab from 07/20/2017 in [facility]  Referring Provider  [facility]      Encounter Date: 11/06/2017  Check In: Session Check In - 11/06/17 1006      Check-In   Location  [facility]    Staff Present  Nada Maclachlan, BA, ACSM CEP, Exercise Physiologist;Udell Mazzocco Tessie Fass RCP,RRT,BSRT;Carroll Enterkin, RN, BSN    Supervising physician immediately available to respond to emergencies  [facility] immediately available ER MD    Physician(s)  Dr. Mable Paris and [facility]    Medication changes reported      No    Fall or balance concerns reported     No    Warm-up and Cool-down  Performed as group-led instruction    Resistance Training Performed  Yes    VAD Patient?  No      Pain Assessment   Currently in Pain?  No/denies          Social History   Tobacco Use  Smoking Status Never Smoker  Smokeless Tobacco Never Used    Goals Met:  Independence with exercise equipment Exercise tolerated well No report of cardiac concerns or symptoms Strength training completed today  Goals Unmet:  Not Applicable  Comments: Pt able to follow exercise prescription today without complaint.  Will continue to monitor for progression.   Dr. Emily Filbert is Medical Director for [place] and [facility].

## 2017-11-09 DIAGNOSIS — J4550 Severe persistent asthma, uncomplicated: Secondary | ICD-10-CM | POA: Diagnosis not present

## 2017-11-09 NOTE — Progress Notes (Signed)
Daily Session Note  Patient Details  Name: Sandra Fischer MRN: [id number] Date of Birth: 09-23-1951 Referring Provider:     Pulmonary Rehab from 07/20/2017 in [facility]  Referring Provider  [facility]      Encounter Date: 11/09/2017  Check In: Session Check In - 11/09/17 1008      Check-In   Location  [facility]    Staff Present  Nada Maclachlan, BA, ACSM CEP, Exercise Physiologist;Kelly Amedeo Plenty, BS, ACSM CEP, Exercise Physiologist;Tedrick [place] Flavia Shipper    Supervising physician immediately available to respond to emergencies  [facility] immediately available ER MD    Physician(s)  Dr. Burlene Arnt and Archie Balboa    Medication changes reported      No    Fall or balance concerns reported     No    Warm-up and Cool-down  Performed as group-led instruction    Resistance Training Performed  Yes    VAD Patient?  No      Pain Assessment   Currently in Pain?  No/denies          Social History   Tobacco Use  Smoking Status Never Smoker  Smokeless Tobacco Never Used    Goals Met:  Independence with exercise equipment Exercise tolerated well No report of cardiac concerns or symptoms Strength training completed today  Goals Unmet:  Not Applicable  Comments: Pt able to follow exercise prescription today without complaint.  Will continue to monitor for progression.   Dr. Emily Filbert is Medical Director for [place] and [facility].

## 2017-11-13 DIAGNOSIS — J4550 Severe persistent asthma, uncomplicated: Secondary | ICD-10-CM | POA: Diagnosis not present

## 2017-11-13 NOTE — Progress Notes (Signed)
Daily Session Note  Patient Details  Name: Sandra Fischer MRN: [id number] Date of Birth: September 03, 1951 Referring Provider:     Pulmonary Rehab from 07/20/2017 in [facility]  Referring Provider  [facility]      Encounter Date: 11/13/2017  Check In: Session Check In - 11/13/17 1045      Check-In   Location  [facility]    Staff Present  Nada Maclachlan, BA, ACSM CEP, Exercise Physiologist;Carroll Enterkin, RN, BSN;Joseph [employer] physician immediately available to respond to emergencies  [facility] immediately available ER MD    Physician(s)  Dr. Corky Downs and Burlene Arnt    Medication changes reported      No    Fall or balance concerns reported     No    Warm-up and Cool-down  Performed as group-led instruction    Resistance Training Performed  No patient late    VAD Patient?  No      Pain Assessment   Currently in Pain?  No/denies          Social History   Tobacco Use  Smoking Status Never Smoker  Smokeless Tobacco Never Used    Goals Met:  Independence with exercise equipment Exercise tolerated well No report of cardiac concerns or symptoms Strength training completed today  Goals Unmet:  Not Applicable  Comments: Pt able to follow exercise prescription today without complaint.  Will continue to monitor for progression.   Dr. Emily Filbert is Medical Director for [place] and [facility].

## 2017-11-16 ENCOUNTER — Encounter: Payer: Non-veteran care | Admitting: *Deleted

## 2017-11-16 DIAGNOSIS — J4550 Severe persistent asthma, uncomplicated: Secondary | ICD-10-CM

## 2017-11-16 NOTE — Progress Notes (Signed)
Pulmonary Individual Treatment Plan  Patient Details  Name: Sandra Fischer MRN: [id number] Date of Birth: [birth year] Referring Provider:     Pulmonary Rehab from 07/20/2017 in [facility] Cardiac and Pulmonary Rehab  Referring Provider  [facility]      Initial Encounter Date:    Pulmonary Rehab from 07/20/2017 in [facility] Cardiac and Pulmonary Rehab  Date  07/20/17  Referring Provider  [facility]      Visit Diagnosis: Severe persistent asthma without complication  Patient's Home Medications on Admission:  Current Outpatient Medications:  .  Abatacept 125 MG/ML SOAJ, Take 750 mg by mouth., Disp: , Rfl:  .  albuterol (ACCUNEB) 1.25 MG/3ML nebulizer solution, Take 1 ampule by nebulization every 6 (six) hours as needed for wheezing., Disp: , Rfl:  .  albuterol (PROVENTIL HFA;VENTOLIN HFA) 108 (90 Base) MCG/ACT inhaler, Inhale into the lungs every 6 (six) hours as needed for wheezing or shortness of breath., Disp: , Rfl:  .  albuterol (PROVENTIL HFA;VENTOLIN HFA) 108 (90 Base) MCG/ACT inhaler, Inhale 2 puffs into the lungs every 6 (six) hours as needed for wheezing or shortness of breath., Disp: 1 Inhaler, Rfl: 0 .  aspirin EC 81 MG tablet, Take 81 mg by mouth daily., Disp: , Rfl:  .  azithromycin (ZITHROMAX) 500 MG tablet, Take 1 tablet (500 mg total) by mouth daily., Disp: 4 tablet, Rfl: 0 .  budesonide-formoterol (SYMBICORT) 160-4.5 MCG/ACT inhaler, Inhale 2 puffs into the lungs 2 (two) times daily., Disp: , Rfl:  .  cetirizine (ZYRTEC) 10 MG tablet, Take 10 mg by mouth daily., Disp: , Rfl:  .  desloratadine (CLARINEX) 5 MG tablet, Take 5 mg by mouth daily., Disp: , Rfl:  .  ferrous sulfate 325 (65 FE) MG tablet, Take 325 mg by mouth daily with breakfast., Disp: , Rfl:  .  fluconazole (DIFLUCAN) 150 MG tablet, Take 1 tablet (150 mg total) by mouth once. (Patient not taking: Reported on 08/23/2016), Disp: 2 tablet, Rfl: 1 .  guaiFENesin-dextromethorphan (ROBITUSSIN DM) 100-10 MG/5ML syrup, Take 5  mLs by mouth every 4 (four) hours as needed for cough., Disp: 118 mL, Rfl: 0 .  ipratropium (ATROVENT) 0.02 % nebulizer solution, Take 0.5 mg by nebulization 4 (four) times daily., Disp: , Rfl:  .  levothyroxine (SYNTHROID, LEVOTHROID) 88 MCG tablet, Take 88 mcg by mouth daily before breakfast., Disp: , Rfl:  .  lisinopril (PRINIVIL,ZESTRIL) 40 MG tablet, Take 40 mg by mouth daily., Disp: , Rfl:  .  montelukast (SINGULAIR) 10 MG tablet, Take 10 mg by mouth at bedtime., Disp: , Rfl:  .  omeprazole (PRILOSEC) 10 MG capsule, Take 20 mg by mouth daily., Disp: , Rfl:  .  pravastatin (PRAVACHOL) 40 MG tablet, Take 40 mg by mouth daily., Disp: , Rfl:  .  predniSONE (DELTASONE) 10 MG tablet, Take 1 tablet (10 mg total) by mouth daily., Disp: 42 tablet, Rfl: 0 .  Prenatal Vit-Fe Fumarate-FA (MULTIVITAMIN-PRENATAL) 27-0.8 MG TABS tablet, Take 1 tablet by mouth daily at 12 noon., Disp: , Rfl:  .  sulfamethoxazole-trimethoprim (BACTRIM DS,SEPTRA DS) 800-160 MG tablet, Take 1 tablet by mouth 2 (two) times daily., Disp: 14 tablet, Rfl: 0 .  traMADol (ULTRAM) 50 MG tablet, Take 100 mg by mouth as needed (for rheumatoid arthritis)., Disp: , Rfl:  .  verapamil (CALAN) 120 MG tablet, Take 240 mg by mouth 2 (two) times daily., Disp: , Rfl:   Past Medical History: Past Medical History:  Diagnosis Date  . Asthma   .  Hypertension   . RA (rheumatoid arthritis) (HCC)     Tobacco Use: Social History   Tobacco Use  Smoking Status Never Smoker  Smokeless Tobacco Never Used    Labs: Recent Review Flowsheet Data    There is no flowsheet data to display.       Pulmonary Assessment Scores: Pulmonary Assessment Scores    Row Name 07/20/17 1619         ADL UCSD   ADL Phase  Entry     SOB Score total  40     Rest  1     Walk  2     Stairs  4     Bath  1     Dress  2     Shop  3       mMRC Score   mMRC Score  4        Pulmonary Function Assessment: Pulmonary Function Assessment - 07/20/17 1657       Pulmonary Function Tests   FVC%  62 %    FEV1%  39 %    FEV1/FVC Ratio  50       Exercise Target Goals:    Exercise Program Goal: Individual exercise prescription set with THRR, safety & activity barriers. Participant demonstrates ability to understand and report RPE using BORG scale, to self-measure pulse accurately, and to acknowledge the importance of the exercise prescription.  Exercise Prescription Goal: Starting with aerobic activity 30 plus minutes a day, 3 days per week for initial exercise prescription. Provide home exercise prescription and guidelines that participant acknowledges understanding prior to discharge.  Activity Barriers & Risk Stratification: Activity Barriers & Cardiac Risk Stratification - 07/20/17 1613      Activity Barriers & Cardiac Risk Stratification   Activity Barriers  Arthritis;Deconditioning;Muscular Weakness;Shortness of Breath;Joint Problems Rhuematoid Arthritis, joints swell (especially ankles, hips, and knees0       6 Minute Walk: 6 Minute Walk    Row Name 07/20/17 1611         6 Minute Walk   Phase  Initial     Distance  1122 feet     Walk Time  6 minutes     # of Rest Breaks  0     MPH  2.13     METS  2.4     RPE  13     Perceived Dyspnea   3     VO2 Peak  8.41     Symptoms  Yes (comment)     Comments  SOB, chest tightness with breathing heavier     Resting HR  75 bpm     Resting BP  138/64     Max Ex. HR  105 bpm     Max Ex. BP  134/56     2 Minute Post BP  130/60       Interval HR   Baseline HR (retired)  75     3 Minute HR  105     6 Minute HR  105     2 Minute Post HR  75     Interval Heart Rate?  Yes pulse oximeter did not pick up heart rate very well       Interval Oxygen   Interval Oxygen?  Yes     Baseline Oxygen Saturation %  95 %     Resting Liters of Oxygen  0 L Room Air     1 Minute Oxygen Saturation %  94 %  1 Minute Liters of Oxygen  0 L     2 Minute Oxygen Saturation %  93 %     2 Minute  Liters of Oxygen  0 L     3 Minute Oxygen Saturation %  95 %     3 Minute Liters of Oxygen  0 L     4 Minute Oxygen Saturation %  95 %     4 Minute Liters of Oxygen  0 L     5 Minute Oxygen Saturation %  96 %     5 Minute Liters of Oxygen  0 L     6 Minute Oxygen Saturation %  96 %     6 Minute Liters of Oxygen  0 L     2 Minute Post Oxygen Saturation %  98 %     2 Minute Post Liters of Oxygen  0 L       Oxygen Initial Assessment: Oxygen Initial Assessment - 07/20/17 1655      Home Oxygen   Home Oxygen Device  None    Sleep Oxygen Prescription  None    Home Exercise Oxygen Prescription  None    Home at Rest Exercise Oxygen Prescription  None      Initial 6 min Walk   Oxygen Used  None      Intervention   Short Term Goals  To learn and understand importance of monitoring SPO2 with pulse oximeter and demonstrate accurate use of the pulse oximeter.;To Learn and understand importance of maintaining oxygen saturations>88%;To learn and demonstrate proper purse lipped breathing techniques or other breathing techniques.;To learn and demonstrate proper use of respiratory medications    Long  Term Goals  Maintenance of O2 saturations>88%;Compliance with respiratory medication;Demonstrates proper use of MDI's;Exhibits proper breathing techniques, such as purse lipped breathing or other method taught during program session;Verbalizes importance of monitoring SPO2 with pulse oximeter and return demonstration       Oxygen Re-Evaluation: Oxygen Re-Evaluation    Row Name 08/17/17 1131 10/07/17 1059 10/26/17 1650         Program Oxygen Prescription   Program Oxygen Prescription  -  None  None       Home Oxygen   Home Oxygen Device  -  None  None     Sleep Oxygen Prescription  -  None  CPAP has not been using     Liters per minute  -  -  0     Home Exercise Oxygen Prescription  -  None  None     Home at Rest Exercise Oxygen Prescription  -  None  None     Compliance with Home Oxygen  Use  -  -  No does not wear CPAP       Goals/Expected Outcomes   Short Term Goals  To learn and understand importance of monitoring SPO2 with pulse oximeter and demonstrate accurate use of the pulse oximeter.;To learn and demonstrate proper purse lipped breathing techniques or other breathing techniques.;To Learn and understand importance of maintaining oxygen saturations>88%;To learn and demonstrate proper use of respiratory medications;To learn and exhibit compliance with exercise, home and travel O2 prescription  To learn and understand importance of maintaining oxygen saturations>88%;To learn and demonstrate proper use of respiratory medications;To learn and demonstrate proper pursed lip breathing techniques or other breathing techniques.;To learn and understand importance of monitoring SPO2 with pulse oximeter and demonstrate accurate use of the pulse oximeter.  To learn and demonstrate proper use of  respiratory medications;To learn and understand importance of maintaining oxygen saturations>88%;To learn and demonstrate proper pursed lip breathing techniques or other breathing techniques.;To learn and understand importance of monitoring SPO2 with pulse oximeter and demonstrate accurate use of the pulse oximeter.     Long  Term Goals  Exhibits proper breathing techniques, such as purse lipped breathing or other method taught during program session;Maintenance of O2 saturations>88%;Compliance with respiratory medication;Exhibits compliance with exercise, home and travel O2 prescription;Verbalizes importance of monitoring SPO2 with pulse oximeter and return demonstration  Verbalizes importance of monitoring SPO2 with pulse oximeter and return demonstration;Exhibits proper breathing techniques, such as pursed lip breathing or other method taught during program session;Demonstrates proper use of MDI's;Compliance with respiratory medication;Maintenance of O2 saturations>88%  Verbalizes importance of  monitoring SPO2 with pulse oximeter and return demonstration;Exhibits proper breathing techniques, such as pursed lip breathing or other method taught during program session;Demonstrates proper use of MDI's;Compliance with respiratory medication;Maintenance of O2 saturations>88%     Comments  On patient's first day of exercise pursed lip breathing was discussed and demonstrated as well as instruction on using the pulse oximeter and proper oxygen saturations during rest and exercise. Patient demonstrated understanding of these concepts.   Sandra Fischer has to get a sleep study done. She states that her CPAP is old and has not worn it in awhile. She also needs to get a pulse oximeter to check her oxygen at home. Informed patient that she needs to keep her oxygen saturation above 88 percent. Sandra Fischer needs to work on PLB outside of class and in class. She keeps a spacer with her to take her albuterol when needed. Sandra Fischer also uses her daily maintinence inhaler as prescribed.  Sandra Fischer has been taking her respiratory medications as prescribed. Her shortness of breath has been a little better since the start of [facility].  She has yet to perfect PLB.      Goals/Expected Outcomes  Short: Patient will use PLB techniques in pulmonary rehab to help control SOB and maintain acceptable oygen saturations. Long: Patient will independenly use respiratory medications and breathing techniques to manage SOB and oxygen saturations during daily life activities. at home and during pulmonary rehab.   Short: Obtain a pulse oximter. Long: Independently check oxygen at home.  Short. Work on PLB. Long: Be independent with PLB        Oxygen Discharge (Final Oxygen Re-Evaluation): Oxygen Re-Evaluation - 10/26/17 1650      Program Oxygen Prescription   Program Oxygen Prescription  None      Home Oxygen   Home Oxygen Device  None    Sleep Oxygen Prescription  CPAP has not been using    Liters per minute  0    Home Exercise Oxygen  Prescription  None    Home at Rest Exercise Oxygen Prescription  None    Compliance with Home Oxygen Use  No does not wear CPAP      Goals/Expected Outcomes   Short Term Goals  To learn and demonstrate proper use of respiratory medications;To learn and understand importance of maintaining oxygen saturations>88%;To learn and demonstrate proper pursed lip breathing techniques or other breathing techniques.;To learn and understand importance of monitoring SPO2 with pulse oximeter and demonstrate accurate use of the pulse oximeter.    Long  Term Goals  Verbalizes importance of monitoring SPO2 with pulse oximeter and return demonstration;Exhibits proper breathing techniques, such as pursed lip breathing or other method taught during program session;Demonstrates proper use of MDI's;Compliance with respiratory medication;Maintenance of O2  saturations>88%    Comments  Sandra Fischer has been taking her respiratory medications as prescribed. Her shortness of breath has been a little better since the start of [facility].  She has yet to perfect PLB.     Goals/Expected Outcomes  Short. Work on PLB. Long: Be independent with PLB       Initial Exercise Prescription: Initial Exercise Prescription - 07/20/17 1600      Date of Initial Exercise RX and Referring Provider   Date  07/20/17    Referring Provider  [facility]      Treadmill   MPH  1.8    Grade  0    Minutes  15    METs  2.38      NuStep   Level  1    SPM  80    Minutes  15    METs  2      REL-XR   Level  1    Speed  50    Minutes  15    METs  2      Prescription Details   Frequency (times per week)  3    Duration  Progress to 45 minutes of aerobic exercise without signs/symptoms of physical distress      Intensity   THRR 40-80% of Max Heartrate  108-141    Ratings of Perceived Exertion  11-13    Perceived Dyspnea  0-4      Progression   Progression  Continue to progress workloads to maintain intensity without signs/symptoms of  physical distress.      Resistance Training   Training Prescription  Yes    Weight  2 lbs    Reps  10-15       Perform Capillary Blood Glucose checks as needed.  Exercise Prescription Changes: Exercise Prescription Changes    Row Name 08/26/17 1100 10/07/17 1200 10/09/17 1100 10/21/17 1500 11/04/17 1100     Response to Exercise   Blood Pressure (Admit)  126/70  164/74  -  146/82  128/78   Blood Pressure (Exercise)  148/92  156/74  -  -  -   Blood Pressure (Exit)  126/64  138/64  -  134/84  126/74   Heart Rate (Admit)  85 bpm  83 bpm  -  85 bpm  81 bpm   Heart Rate (Exercise)  148 bpm  138 bpm  -  118 bpm  128 bpm   Heart Rate (Exit)  86 bpm  89 bpm  -  81 bpm  87 bpm   Oxygen Saturation (Admit)  96 %  97 %  -  95 %  95 %   Oxygen Saturation (Exercise)  96 %  95 %  -  94 %  94 %   Oxygen Saturation (Exit)  95 %  93 %  -  95 %  96 %   Rating of Perceived Exertion (Exercise)  12  11  -  12  13   Perceived Dyspnea (Exercise)  1  1  -  1  1   Symptoms  none  none  -  none  none   Duration  Progress to 45 minutes of aerobic exercise without signs/symptoms of physical distress  Continue with 45 min of aerobic exercise without signs/symptoms of physical distress.  -  Continue with 45 min of aerobic exercise without signs/symptoms of physical distress.  Continue with 45 min of aerobic exercise without signs/symptoms of physical distress.   Intensity  THRR  unchanged  THRR unchanged  -  THRR unchanged  THRR unchanged     Progression   Progression  Continue to progress workloads to maintain intensity without signs/symptoms of physical distress.  Continue to progress workloads to maintain intensity without signs/symptoms of physical distress.  -  Continue to progress workloads to maintain intensity without signs/symptoms of physical distress.  Continue to progress workloads to maintain intensity without signs/symptoms of physical distress.   Average METs  2.25  2.6  -  2.4  3.35     Resistance  Training   Training Prescription  Yes  Yes  -  Yes  Yes   Weight  2 lb  2 lb  -  2 lb  2 lb   Reps  10-15  10-15  -  10-15  10-15     Interval Training   Interval Training  -  No  -  No  No     Treadmill   MPH  2  2.4  -  -  2.6   Grade  0  1  -  -  2   Minutes  15  15  -  -  15   METs  2.52  3.17  -  -  3.71     NuStep   Level  -  3  -  3  3   SPM  -  96  -  92  81   Minutes  -  15  -  15  15   METs  -  3  -  3.3  3     REL-XR   Level  1  1  -  2  -   Speed  53  60  -  48  -   Minutes  15  15  -  15  -   METs  2.1  1.6  -  1.5  -     Home Exercise Plan   Plans to continue exercise at  -  -  Home (comment) walking  Home (comment) walking  Home (comment) walking   Frequency  -  -  Add 1 additional day to program exercise sessions.  Add 1 additional day to program exercise sessions.  Add 1 additional day to program exercise sessions.   Initial Home Exercises Provided  -  -  10/09/17  10/09/17  10/09/17      Exercise Comments: Exercise Comments    Row Name 08/17/17 1130           Exercise Comments  First full day of exercise!  Patient was oriented to gym and equipment including functions, settings, policies, and procedures.  Patient's individual exercise prescription and treatment plan were reviewed.  All starting workloads were established based on the results of the 6 minute walk test done at initial orientation visit.  The plan for exercise progression was also introduced and progression will be customized based on patient's performance and goals          Exercise Goals and Review: Exercise Goals    Row Name 07/20/17 1615             Exercise Goals   Increase Physical Activity  Yes       Intervention  Provide advice, education, support and counseling about physical activity/exercise needs.;Develop an individualized exercise prescription for aerobic and resistive training based on initial evaluation findings, risk stratification, comorbidities and participant's  personal goals.       Expected Outcomes  Achievement of increased cardiorespiratory fitness and enhanced flexibility, muscular endurance and strength shown through measurements of functional capacity and personal statement of participant.       Increase Strength and Stamina  Yes       Intervention  Provide advice, education, support and counseling about physical activity/exercise needs.;Develop an individualized exercise prescription for aerobic and resistive training based on initial evaluation findings, risk stratification, comorbidities and participant's personal goals.       Expected Outcomes  Achievement of increased cardiorespiratory fitness and enhanced flexibility, muscular endurance and strength shown through measurements of functional capacity and personal statement of participant.          Exercise Goals Re-Evaluation : Exercise Goals Re-Evaluation    [place] Name 08/17/17 1141 08/26/17 1202 09/09/17 1208 10/07/17 1254 10/09/17 1140     Exercise Goal Re-Evaluation   Exercise Goals Review  Increase Physical Activity;Increase Strenth and Stamina  Increase Physical Activity  -  Increase Physical Activity;Increase Strength and Stamina;Able to understand and use rate of perceived exertion (RPE) scale;Able to understand and use Dyspnea scale  Increase Physical Activity;Increase Strength and Stamina;Knowledge and understanding of Target Heart Rate Range (THRR);Able to understand and use rate of perceived exertion (RPE) scale;Understanding of Exercise Prescription   Comments  Sandra Fischer came to her first day of exercise today. She had been out for several week since her initail medical review.   Sandra Fischer attended her first two sessions.  She tolerated exercise well.    Sandra Fischer has not attended since 8/22.  Sandra Fischer is tolerating exercise well.  She has increased levels on all machines.  Reviewed home exercise with pt today.  Pt plans to walk an extra day at home for exercise.  Reviewed THR, pulse, RPE, sign and  symptoms, NTG use, and when to call 911 or MD.  Also discussed weather considerations and indoor options.  Pt voiced understanding   Expected Outcomes  Short: Litzy will be consistant with her attendance to pulmonary rehab. Long: With consistant attendance and exercise Sandra Fischer will gain strength and stamina to progress with her exercise in rehab and other ADL's.   Short - Grayson will attend LW regularly.  Long - Keishawna will improve overall fitness.  -  Short - Teressa will continue to attend regularly.  Long - Brantleigh will continue to improve overall fitness level.  Short: add 1 extra day of walking to home exercise. Long: Maintain a home exercise routine and add additional days of walking   Row Name 10/21/17 1523 11/04/17 1110           Exercise Goal Re-Evaluation   Exercise Goals Review  Increase Physical Activity;Increase Strength and Stamina  Increase Physical Activity;Increase Strength and Stamina;Able to understand and use rate of perceived exertion (RPE) scale;Able to understand and use Dyspnea scale      Comments  Phylis is tolerating exercise well and has increased resistance on all equipment.    Perpetua is progressing well with exercise.  She has increased overall MET level.      Expected Outcomes  Short - Marcedes will continue to attend consistently.  Long - Joni will see continued improvements in MET level.  Short - Nylee will continue to progress workloads. Long - Trena willl maintain fitness improvements.         Discharge Exercise Prescription (Final Exercise Prescription Changes): Exercise Prescription Changes - 11/04/17 1100      Response to Exercise   Blood Pressure (Admit)  128/78    Blood Pressure (Exit)  126/74    Heart Rate (Admit)  81 bpm    Heart Rate (Exercise)  128 bpm    Heart Rate (Exit)  87 bpm    Oxygen Saturation (Admit)  95 %    Oxygen Saturation (Exercise)  94 %    Oxygen Saturation (Exit)  96 %    Rating of Perceived Exertion (Exercise)  13    Perceived  Dyspnea (Exercise)  1    Symptoms  none    Duration  Continue with 45 min of aerobic exercise without signs/symptoms of physical distress.    Intensity  THRR unchanged      Progression   Progression  Continue to progress workloads to maintain intensity without signs/symptoms of physical distress.    Average METs  3.35      Resistance Training   Training Prescription  Yes    Weight  2 lb    Reps  10-15      Interval Training   Interval Training  No      Treadmill   MPH  2.6    Grade  2    Minutes  15    METs  3.71      NuStep   Level  3    SPM  81    Minutes  15    METs  3      Home Exercise Plan   Plans to continue exercise at  Home (comment) walking    Frequency  Add 1 additional day to program exercise sessions.    Initial Home Exercises Provided  10/09/17       Nutrition:  Target Goals: Understanding of nutrition guidelines, daily intake of sodium <1511m, cholesterol <2055m calories 30% from fat and 7% or less from saturated fats, daily to have 5 or more servings of fruits and vegetables.  Biometrics: Pre Biometrics - 07/20/17 1615      Pre Biometrics   Height  5' 2.2" (1.58 m)    Weight  211 lb 3.2 oz (95.8 kg)    Waist Circumference  41 inches    Hip Circumference  43.5 inches    Waist to Hip Ratio  0.94 %    BMI (Calculated)  38.5        Nutrition Therapy Plan and Nutrition Goals: Nutrition Therapy & Goals - 10/29/17 1755      Nutrition Therapy   Diet  basic healthy diet for weight loss, low sodium    Drug/Food Interactions  Statins/Certain Fruits    Protein (specify units)  8oz    Fruits and Vegetables  5 servings/day    Sodium  1500 grams      Personal Nutrition Goals   Personal Goal #2  Continue with current healthy food choices and regular exercise    Personal Goal #3  Use food lists and menus given to help keep nutritionally balanced meals and include variety of choices    Personal Goal #4  Keep working to limit sweets by eating small  portions and limit how often; try tracking intake of sweets to stay aware of how much and how often you eat them.     Comments  Ms. Stout has made significant positive changes on her own, and reports weight loss of 15lbs since starting [facility] classes. She is motivated to continue.        Nutrition Discharge: Rate Your Plate Scores:   Nutrition Goals Re-Evaluation: Nutrition Goals Re-Evaluation    Row Name 08/17/17 1139 10/09/17 1415 10/26/17 1658  Goals   Current Weight  206 lb (93.4 kg)  201 lb (91.2 kg)  196 lb (88.9 kg)     Nutrition Goal  Has a scheduled appointment to meet with the dietician on 09/10/17 to set more specific nutrition goal.   Lose weight and adhere to a healthier diet.  Adhere to a diet plan and continue to lose weight.     Comment  -  Sandra Fischer states she has been eating more vegetables and salad. She has been eating less red meat. She met with the dietician and has a good outlook for what to eat.  Sandra Fischer has been trying to lose weight. So far she has done a great job. She states she has been eating smaller portions which has helped her alot. Her appointment for the Nutritionist is November 1st.     Expected Outcome  -  Short: lose weight by eating healthy. Long: Adhere to a diet plan to maintain weight loss.  Short:meet with the nutritionist. Long: Maintain a healthy diet.        Nutrition Goals Discharge (Final Nutrition Goals Re-Evaluation): Nutrition Goals Re-Evaluation - 10/26/17 1658      Goals   Current Weight  196 lb (88.9 kg)    Nutrition Goal  Adhere to a diet plan and continue to lose weight.    Comment  Sandra Fischer has been trying to lose weight. So far she has done a great job. She states she has been eating smaller portions which has helped her alot. Her appointment for the Nutritionist is November 1st.    Expected Outcome  Short:meet with the nutritionist. Long: Maintain a healthy diet.       Psychosocial: Target Goals: Acknowledge presence or  absence of significant depression and/or stress, maximize coping skills, provide positive support system. Participant is able to verbalize types and ability to use techniques and skills needed for reducing stress and depression.   Initial Review & Psychosocial Screening: Initial Psych Review & Screening - 07/20/17 1700      Initial Review   Current issues with  None Identified      Family Dynamics   Good Support System?  Yes      Barriers   Psychosocial barriers to participate in program  The patient should benefit from training in stress management and relaxation.      Screening Interventions   Interventions  Program counselor consult       Quality of Life Scores: Quality of Life - 07/20/17 1620      Quality of Life Scores   Health/Function Pre  15.25 %    Socioeconomic Pre  19.36 %    Psych/Spiritual Pre  19.79 %    Family Pre  21.25 %    GLOBAL Pre  17.74 %       PHQ-9: Recent Review Flowsheet Data    Depression screen [facility] 2/9 09/30/2017 07/20/2017   Decreased Interest 1 1   Down, Depressed, Hopeless 0 0   PHQ - 2 Score 1 1   Altered sleeping 1 2   Tired, decreased energy 2 3   Change in appetite 1 2   Feeling bad or failure about yourself  1 1   Trouble concentrating 1 3   Moving slowly or fidgety/restless 0 0   Suicidal thoughts 0 0   PHQ-9 Score 7 12   Difficult doing work/chores Somewhat difficult Somewhat difficult     Interpretation of Total Score  Total Score Depression Severity:  1-4 = Minimal  depression, 5-9 = Mild depression, 10-14 = Moderate depression, 15-19 = Moderately severe depression, 20-27 = Severe depression   Psychosocial Evaluation and Intervention: Psychosocial Evaluation - 08/17/17 1028      Psychosocial Evaluation & Interventions   Interventions  Encouraged to exercise with the program and follow exercise prescription;Relaxation education;Stress management education    Comments  Counselor met with Ms. Sunday Corn today Hassan Rowan) for initial  psychosocial evaluation.  She is a 66 year old who has asthma and many other health issues, including RA; Fibromyalgia; HBP and High Cholesterol; obesity and she had her thyroid removed approximately 10 years ago.  Emree has a strong support system with a spouse of 22 years and she is actively involved in her local church.  Sandra Fischer has chronic sleep problems with maybe 4 nights of 4-6 hours per week on average.  She denies a history of depression or anxiety or any current symptoms and states she is typically in a positive mood most of the time.  Charity has multiple stressors with her health; caring for her mother with dementia and finances.  She has goals to lose weight and increase her stamina and strength.  Sandra Fischer will be followed by staff throughout the course of this program.      Expected Outcomes  Sandra Fischer will benefit from consistent exercise to achieve her stated goals.  She will be seeing the dietician who will help her address her weight loss goals.  Lateasha will also benefit from the educational and psychoeducational components of this program to help her manage her medical issues and cope better with her stressors.      Continue Psychosocial Services   Follow up required by staff       Psychosocial Re-Evaluation: Psychosocial Re-Evaluation    [place] Name 09/30/17 1129 10/26/17 1702           Psychosocial Re-Evaluation   Current issues with  Current Stress Concerns;Current Sleep Concerns  Current Stress Concerns;Current Sleep Concerns      Comments  Counselor follow up with Hassan Rowan today reporting her sleep has improved slightly since she came into the program. 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  Keriana has been out of class for several weeks due to her mother's health; which resulted in her mother going into a nursing home during that time.  Sandra Fischer reports taking that opportunity to focus on her  personal self-care and attended to much needed medical appointments.  Also she has been able to have additional help for her mother in place.  This reduces her stress.  Sandra Fischer reports exercising also helps reduce her stress and she is happy to be back in class.  She received a phone call from a Dr.'s office while meeting with counselor and became a little stressed about that interruption.  Counselor encouraged Sandra Fischer to turn her phone off during her self-care time - like this class.  Her spouse knows how to care for Carnita's mother and she agreed this would help her focus more and enjoy the class more.  Sandra Fischer states finances continue to be a stressor but her spouse may be getting disability soon and that will be helpful.  Counselor commended Sandra Fischer for her progress made and commitment to exercise and positive self-care.   Sandra Fischer's current stressor is taking care of her mom with dementia. She enjoys class but sometimes cant focus like she would like to.  Expected Outcomes  Sandra Fischer will continue to exercise consistently.  She will begin to set better limits on her time in class by turning her phone off to decrease her stress and improve her self-care.  She will need to meet with the dietician since she missed that appointment while out with her mother.    Short: attend [facility] regularly to decrease stress. Long: Maintain an exercise to keep stress to a minimum.      Interventions  -  Encouraged to attend Pulmonary Rehabilitation for the exercise      Continue Psychosocial Services   Follow up required by staff  Follow up required by staff        Initial Review   Source of Stress Concerns  Family;Financial  -         Psychosocial Discharge (Final Psychosocial Re-Evaluation): Psychosocial Re-Evaluation - 10/26/17 1702      Psychosocial Re-Evaluation   Current issues with  Current Stress Concerns;Current Sleep Concerns    Comments  Berklie's current stressor is taking care of her mom with dementia.  She enjoys class but sometimes cant focus like she would like to.    Expected Outcomes  Short: attend [facility] regularly to decrease stress. Long: Maintain an exercise to keep stress to a minimum.    Interventions  Encouraged to attend Pulmonary Rehabilitation for the exercise    Continue Psychosocial Services   Follow up required by staff       Education: Education Goals: Education classes will be provided on a weekly basis, covering required topics. Participant will state understanding/return demonstration of topics presented.  Learning Barriers/Preferences: Learning Barriers/Preferences - 07/20/17 1656      Learning Barriers/Preferences   Learning Barriers  None    Learning Preferences  None       Education Topics: Initial Evaluation Education: - Verbal, written and demonstration of respiratory meds, RPE/PD scales, oximetry and breathing techniques. Instruction on use of nebulizers and MDIs: cleaning and proper use, rinsing mouth with steroid doses and importance of monitoring MDI activations.   Pulmonary Rehab from 11/13/2017 in [facility] Cardiac and Pulmonary Rehab  Date  07/20/17  Educator  [facility]  Instruction Review Code (retired)  2- meets goals/outcomes  Instruction Review Code  1- IT trainer Nutrition Guidelines/Fats and Fiber: -Group instruction provided by verbal, written material, models and posters to present the general guidelines for heart healthy nutrition. Gives an explanation and review of dietary fats and fiber.   Pulmonary Rehab from 11/13/2017 in [facility] Cardiac and Pulmonary Rehab  Date  08/17/17  Educator  CR      Controlling Sodium/Reading Food Labels: -Group verbal and written material supporting the discussion of sodium use in heart healthy nutrition. Review and explanation with models, verbal and written materials for utilization of the food label.   Pulmonary Rehab from 11/13/2017 in [facility] Cardiac and Pulmonary Rehab  Date  10/19/17   Educator  CR  Instruction Review Code  5- Refused Teaching      Exercise Physiology & Risk Factors: - Group verbal and written instruction with models to review the exercise physiology of the cardiovascular system and associated critical values. Details cardiovascular disease risk factors and the goals associated with each risk factor.   Pulmonary Rehab from 11/13/2017 in [facility] Cardiac and Pulmonary Rehab  Date  10/02/17  Educator  Nada Maclachlan, EP  Instruction Review Code  1- Verbalizes Understanding      Aerobic Exercise & Resistance Training: - Gives group  verbal and written discussion on the health impact of inactivity. On the components of aerobic and resistive training programs and the benefits of this training and how to safely progress through these programs.   Pulmonary Rehab from 11/13/2017 in [facility] Cardiac and Pulmonary Rehab  Date  10/30/17  Educator  [facility]  Instruction Review Code  1- Verbalizes Understanding      Flexibility, Balance, General Exercise Guidelines: - Provides group verbal and written instruction on the benefits of flexibility and balance training programs. Provides general exercise guidelines with specific guidelines to those with heart or lung disease. Demonstration and skill practice provided.   Stress Management: - Provides group verbal and written instruction about the health risks of elevated stress, cause of high stress, and healthy ways to reduce stress.   Depression: - Provides group verbal and written instruction on the correlation between heart/lung disease and depressed mood, treatment options, and the stigmas associated with seeking treatment.   Pulmonary Rehab from 11/13/2017 in [facility] Cardiac and Pulmonary Rehab  Date  08/19/17  Educator  [facility]  Instruction Review Code (retired)  2- meets goals/outcomes  Instruction Review Code  1- Science writer Understanding      Exercise & Equipment Safety: - Individual verbal instruction and demonstration  of equipment use and safety with use of the equipment.   Pulmonary Rehab from 11/13/2017 in [facility] Cardiac and Pulmonary Rehab  Date  08/17/17  Educator  [facility]  Instruction Review Code  1- Verbalizes Understanding      Infection Prevention: - Provides verbal and written material to individual with discussion of infection control including proper hand washing and proper equipment cleaning during exercise session.   Pulmonary Rehab from 11/13/2017 in [facility] Cardiac and Pulmonary Rehab  Date  07/20/17  Educator  [facility]  Instruction Review Code  1- Verbalizes Understanding      Falls Prevention: - Provides verbal and written material to individual with discussion of falls prevention and safety.   Pulmonary Rehab from 11/13/2017 in [facility] Cardiac and Pulmonary Rehab  Date  07/20/17  Educator  [facility]  Instruction Review Code (retired)  2- meets goals/outcomes  Instruction Review Code  1- Science writer Understanding      Diabetes: - Individual verbal and written instruction to review signs/symptoms of diabetes, desired ranges of glucose level fasting, after meals and with exercise. Advice that pre and post exercise glucose checks will be done for 3 sessions at entry of program.   Pulmonary Rehab from 11/13/2017 in [facility] Cardiac and Pulmonary Rehab  Date  07/20/17  Educator  [facility]  Instruction Review Code  1- Verbalizes Understanding      Chronic Lung Diseases: - Group verbal and written instruction to review new updates, new respiratory medications, new advancements in procedures and treatments. Provide informative websites and "800" numbers of self-education.   Pulmonary Rehab from 11/13/2017 in [facility] Cardiac and Pulmonary Rehab  Date  09/30/17  Educator  [facility]  Instruction Review Code  1- Verbalizes Understanding      Lung Procedures: - Group verbal and written instruction to describe testing methods done to diagnose lung disease. Review the outcome of test results. Describe the treatment choices:  Pulmonary Function Tests, ABGs and oximetry.   Energy Conservation: - Provide group verbal and written instruction for methods to conserve energy, plan and organize activities. Instruct on pacing techniques, use of adaptive equipment and posture/positioning to relieve shortness of breath.   Triggers: - Group verbal and written instruction to review types of environmental controls: home humidity, furnaces,  filters, dust mite/pet prevention, HEPA vacuums. To discuss weather changes, air quality and the benefits of nasal washing.   Exacerbations: - Group verbal and written instruction to provide: warning signs, infection symptoms, calling MD promptly, preventive modes, and value of vaccinations. Review: effective airway clearance, coughing and/or vibration techniques. Create an Sports administrator.   Oxygen: - Individual and group verbal and written instruction on oxygen therapy. Includes supplement oxygen, available portable oxygen systems, continuous and intermittent flow rates, oxygen safety, concentrators, and Medicare reimbursement for oxygen.   Respiratory Medications: - Group verbal and written instruction to review medications for lung disease. Drug class, frequency, complications, importance of spacers, rinsing mouth after steroid MDI's, and proper cleaning methods for nebulizers.   AED/CPR: - Group verbal and written instruction with the use of models to demonstrate the basic use of the AED with the basic ABC's of resuscitation.   Breathing Retraining: - Provides individuals verbal and written instruction on purpose, frequency, and proper technique of diaphragmatic breathing and pursed-lipped breathing. Applies individual practice skills.   Pulmonary Rehab from 11/13/2017 in [facility] Cardiac and Pulmonary Rehab  Date  08/17/17  Educator  [facility]  Instruction Review Code  1- Actuary and Physiology of the Lungs: - Group verbal and written instruction with the  use of models to provide basic lung anatomy and physiology related to function, structure and complications of lung disease.   Anatomy & Physiology of the Heart: - Group verbal and written instruction and models provide basic cardiac anatomy and physiology, with the coronary electrical and arterial systems. Review of: AMI, Angina, Valve disease, Heart Failure, Cardiac Arrhythmia, Pacemakers, and the ICD.   Pulmonary Rehab from 11/13/2017 in [facility] Cardiac and Pulmonary Rehab  Date  11/13/17  Educator  [facility]  Instruction Review Code  1- Verbalizes Understanding      Heart Failure: - Group verbal and written instruction on the basics of heart failure: signs/symptoms, treatments, explanation of ejection fraction, enlarged heart and cardiomyopathy.   Sleep Apnea: - Individual verbal and written instruction to review Obstructive Sleep Apnea. Review of risk factors, methods for diagnosing and types of masks and machines for OSA.   Anxiety: - Provides group, verbal and written instruction on the correlation between heart/lung disease and anxiety, treatment options, and management of anxiety.   Relaxation: - Provides group, verbal and written instruction about the benefits of relaxation for patients with heart/lung disease. Also provides patients with examples of relaxation techniques.   Cardiac Medications: - Group verbal and written instruction to review commonly prescribed medications for heart disease. Reviews the medication, class of the drug, and side effects.   Know Your Numbers: -Group verbal and written instruction about important numbers in your health.  Review of Cholesterol, Blood Pressure, Diabetes, and BMI and the role they play in your overall health.   Pulmonary Rehab from 11/13/2017 in [facility] Cardiac and Pulmonary Rehab  Date  10/09/17  Educator  [facility]  Instruction Review Code  1- Verbalizes Understanding      Other: -Provides group and verbal instruction on various topics  (see comments)    Knowledge Questionnaire Score: Knowledge Questionnaire Score - 07/20/17 1618      Knowledge Questionnaire Score   Pre Score  8/10 reviewed with pt today        Core Components/Risk Factors/Patient Goals at Admission: Personal Goals and Risk Factors at Admission - 07/20/17 1653      Core Components/Risk Factors/Patient Goals on Admission  Weight Management  Yes    Intervention  Weight Management: Develop a combined nutrition and exercise program designed to reach desired caloric intake, while maintaining appropriate intake of nutrient and fiber, sodium and fats, and appropriate energy expenditure required for the weight goal.;Weight Management: Provide education and appropriate resources to help participant work on and attain dietary goals.;Weight Management/Obesity: Establish reasonable short term and long term weight goals.    Admit Weight  215 lb (97.5 kg)    Goal Weight: Short Term  210 lb (95.3 kg)    Goal Weight: Long Term  150 lb (68 kg)    Expected Outcomes  Understanding of distribution of calorie intake throughout the day with the consumption of 4-5 meals/snacks;Understanding recommendations for meals to include 15-35% energy as protein, 25-35% energy from fat, 35-60% energy from carbohydrates, less than 219m of dietary cholesterol, 20-35 gm of total fiber daily;Weight Loss: Understanding of general recommendations for a balanced deficit meal plan, which promotes 1-2 lb weight loss per week and includes a negative energy balance of [phone] kcal/d;Weight Maintenance: Understanding of the daily nutrition guidelines, which includes 25-35% calories from fat, 7% or less cal from saturated fats, less than 2028mcholesterol, less than 1.5gm of sodium, & 5 or more servings of fruits and vegetables daily;Short Term: Continue to assess and modify interventions until short term weight is achieved    Diabetes  Yes    Intervention  Provide education about signs/symptoms and  action to take for hypo/hyperglycemia.;Provide education about proper nutrition, including hydration, and aerobic/resistive exercise prescription along with prescribed medications to achieve blood glucose in normal ranges: Fasting glucose 65-99 mg/dL    Expected Outcomes  Short Term: Participant verbalizes understanding of the signs/symptoms and immediate care of hyper/hypoglycemia, proper foot care and importance of medication, aerobic/resistive exercise and nutrition plan for blood glucose control.;Long Term: Attainment of HbA1C < 7%.    Lipids  Yes    Intervention  Provide education and support for participant on nutrition & aerobic/resistive exercise along with prescribed medications to achieve LDL <7069mHDL >74m49m  Expected Outcomes  Short Term: Participant states understanding of desired cholesterol values and is compliant with medications prescribed. Participant is following exercise prescription and nutrition guidelines.;Long Term: Cholesterol controlled with medications as prescribed, with individualized exercise RX and with personalized nutrition plan. Value goals: LDL < 70mg49mL > 40 mg.       Core Components/Risk Factors/Patient Goals Review:  Goals and Risk Factor Review    Row Name 08/17/17 1136 10/16/17 1123 10/26/17 1655         Core Components/Risk Factors/Patient Goals Review   Personal Goals Review  Weight Management/Obesity  Weight Management/Obesity;Improve shortness of breath with ADL's;Diabetes;Stress  Weight Management/Obesity;Stress;Diabetes;Improve shortness of breath with ADL's     Review  Weight loss was discussed with that patient. We discussed how physical activity and exercise along with dietary changes can aid in this weight loss goal. She was encouraged to make an appointment with the dietician, which she did.   BrendTonnabeen dealing with her mother and her dementia. She has lost a little weight since the start of the program. Some days are better than others  with her breathing. She states when it gets cold she can feel it in her lungs. She also does not do well with hot weather.  BrendOlamidebeen stressed taking care of her mother with dementia. She is down ten pounds since the start of the program.  Expected Outcomes  Short: Nury has a short term goal of losing 1-2 lbs per week and aiming towards 196 lbs( 10 lbs loss). Keirstyn has will meet with the dietician on 09/10/17 and will continue to consistantly come to  pulmonary rehab for exercise.  Long: Long term weight goal is 150 lbs.   Short: Attend class regularyly to improve ADL's. Long: Maintain routine exercise to improve ADL's independently.  Short: Attemd [facility] to reduce stress. Long: maintain a stress free environment.        Core Components/Risk Factors/Patient Goals at Discharge (Final Review):  Goals and Risk Factor Review - 10/26/17 1655      Core Components/Risk Factors/Patient Goals Review   Personal Goals Review  Weight Management/Obesity;Stress;Diabetes;Improve shortness of breath with ADL's    Review  Tajana has been stressed taking care of her mother with dementia. She is down ten pounds since the start of the program.     Expected Outcomes  Short: Attemd [facility] to reduce stress. Long: maintain a stress free environment.       ITP Comments: ITP Comments    Row Name 07/20/17 1623 07/27/17 0845 07/28/17 0938 08/17/17 1038 08/24/17 0811   ITP Comments  Med eval completed.  Documentation for diagnosis can be found in [place] notes that will be sent to medical records.  30 day review completed ITP sent to Dr. Ramonita Lab for Dr. Emily Filbert Director of [place]. Continue with ITP unless changes are made by physician. Patient has not yet had first day of exercise   Ms. Sickinger called to say she cannot start until Monday August 20th due to her mom needing a sitter while her husband is away.  Patient had her first day of exercise. She felt a little short of breath post exercise and took her  albuterol MDI. Patient recovered and continued to exercise.  30 day review completed. ITP sent to Dr. Emily Filbert Director of [place]. Continue with ITP unless changes are made by physician.     Row Name 09/09/17 1207 09/21/17 0811 09/23/17 1148 09/23/17 1411 10/19/17 0804   ITP Comments  Nianna has not attended since 8/22.    30 day review completed. ITP sent to Dr. Emily Filbert Director of [place]. Continue with ITP unless changes are made by physician.    Latandra has not attended since 08/19/17.  Called to see when and If [facility] would be able to resume [facility]. Message left.  30 day review completed. ITP sent to Dr. Emily Filbert Director of [place]. Continue with ITP unless changes are made by physician.     [place] Name 11/16/17 0807           ITP Comments  30 day review completed. ITP sent to Dr. Emily Filbert Director of [place]. Continue with ITP unless changes are made by physician.            Comments: 30 day review.

## 2017-11-16 NOTE — Progress Notes (Signed)
Daily Session Note  Patient Details  Name: Sandra Fischer MRN: [id number] Date of Birth: [birth year] Referring Provider:     Pulmonary Rehab from 07/20/2017 in [place] and Pulmonary Rehab  Referring Provider  [facility]      Encounter Date: 11/16/2017  Check In: Session Check In - 11/16/17 1039      Check-In   Location  [facility]Card[facility] & Pulmonary Re[facility]    Staff Present  Earlean Shawl, BS, ACSM CEP, Exercise Physiologist;Joseph Foy [place], BA, ACSM CEP, Exercise Physiologist    Supervising physician immediately available to respond to emergencies  [facility] immediately available ER MD    Physician(s)  Drs. Quentin Cornwall and Heber     Medication changes reported      No    Fall or balance concerns reported     No    Warm-up and Cool-down  Performed as group-led Higher education careers adviser Performed  Yes    VAD Patient?  No      Pain Assessment   Currently in Pain?  No/denies    Multiple Pain Sites  No          Social History   Tobacco Use  Smoking Status Never Smoker  Smokeless Tobacco Never Used    Goals Met:  Proper associated with RPD/PD & O2 Sat Independence with exercise equipment Exercise tolerated well Strength training completed today  Goals Unmet:  Not Applicable  Comments: Pt able to follow exercise prescription today without complaint.  Will continue to monitor for progression.    Dr. Emily Filbert is Medical Director for [place] and [facility].

## 2017-11-18 DIAGNOSIS — J4550 Severe persistent asthma, uncomplicated: Secondary | ICD-10-CM | POA: Diagnosis not present

## 2017-11-18 NOTE — Progress Notes (Signed)
Daily Session Note  Patient Details  Name: Sandra Fischer MRN: [id number] Date of Birth: [birth year] Referring Provider:     Pulmonary Rehab from 07/20/2017 in [facility]  Referring Provider  [facility]      Encounter Date: 11/18/2017  Check In: Session Check In - 11/18/17 1024      Check-In   Location  [facility]    Staff Present  Nada Maclachlan, BA, ACSM CEP, Exercise Physiologist;Malaka Ruffner Darrin Nipper, [place], ACSM RCEP, Exercise Physiologist    Supervising physician immediately available to respond to emergencies  [facility] immediately available ER MD    Physician(s)  Dr. Alfred Levins and Archie Balboa    Medication changes reported      No    Fall or balance concerns reported     No    Warm-up and Cool-down  Performed as group-led instruction    Resistance Training Performed  Yes    VAD Patient?  No      Pain Assessment   Currently in Pain?  No/denies          Social History   Tobacco Use  Smoking Status Never Smoker  Smokeless Tobacco Never Used    Goals Met:  Independence with exercise equipment Exercise tolerated well No report of cardiac concerns or symptoms Strength training completed today  Goals Unmet:  Not Applicable  Comments: Pt able to follow exercise prescription today without complaint.  Will continue to monitor for progression.   Dr. Emily Filbert is Medical Director for [place] and [facility].

## 2017-11-27 ENCOUNTER — Telehealth: Payer: Self-pay

## 2017-11-27 DIAGNOSIS — J4550 Severe persistent asthma, uncomplicated: Secondary | ICD-10-CM

## 2017-11-27 NOTE — Telephone Encounter (Signed)
Sandra Fischer called to say that a stress fracture in her foot. Her mom has an appointment today and she will be fitted for a boot for Monday. She hopes to be back on Wednesday.

## 2017-11-30 ENCOUNTER — Encounter: Payer: Non-veteran care | Attending: Internal Medicine

## 2017-11-30 DIAGNOSIS — Z79899 Other long term (current) drug therapy: Secondary | ICD-10-CM | POA: Insufficient documentation

## 2017-11-30 DIAGNOSIS — J4550 Severe persistent asthma, uncomplicated: Secondary | ICD-10-CM | POA: Insufficient documentation

## 2017-11-30 DIAGNOSIS — I10 Essential (primary) hypertension: Secondary | ICD-10-CM | POA: Insufficient documentation

## 2017-11-30 DIAGNOSIS — Z7951 Long term (current) use of inhaled steroids: Secondary | ICD-10-CM | POA: Insufficient documentation

## 2017-11-30 DIAGNOSIS — M069 Rheumatoid arthritis, unspecified: Secondary | ICD-10-CM | POA: Insufficient documentation

## 2017-11-30 DIAGNOSIS — Z7982 Long term (current) use of aspirin: Secondary | ICD-10-CM | POA: Insufficient documentation

## 2017-12-11 ENCOUNTER — Telehealth: Payer: Self-pay

## 2017-12-11 DIAGNOSIS — J4550 Severe persistent asthma, uncomplicated: Secondary | ICD-10-CM

## 2017-12-11 NOTE — Telephone Encounter (Signed)
Sandra Fischer called to say she would be back the first of the year. Her husband is going out of town and her foot has been hurting. She states she will be back in full force after the first of the year.

## 2017-12-14 DIAGNOSIS — J4550 Severe persistent asthma, uncomplicated: Secondary | ICD-10-CM

## 2017-12-14 NOTE — Progress Notes (Signed)
Pulmonary Individual Treatment Plan  Patient Details  Name: Sandra Fischer MRN: [id number] Date of Birth: [birth year] Referring Provider:     Pulmonary Rehab from 07/20/2017 in [facility] Cardiac and Pulmonary Rehab  Referring Provider  [facility]      Initial Encounter Date:    Pulmonary Rehab from 07/20/2017 in [facility] Cardiac and Pulmonary Rehab  Date  07/20/17  Referring Provider  [facility]      Visit Diagnosis: Severe persistent asthma without complication  Patient's Home Medications on Admission:  Current Outpatient Medications:  .  Abatacept 125 MG/ML SOAJ, Take 750 mg by mouth., Disp: , Rfl:  .  albuterol (ACCUNEB) 1.25 MG/3ML nebulizer solution, Take 1 ampule by nebulization every 6 (six) hours as needed for wheezing., Disp: , Rfl:  .  albuterol (PROVENTIL HFA;VENTOLIN HFA) 108 (90 Base) MCG/ACT inhaler, Inhale into the lungs every 6 (six) hours as needed for wheezing or shortness of breath., Disp: , Rfl:  .  albuterol (PROVENTIL HFA;VENTOLIN HFA) 108 (90 Base) MCG/ACT inhaler, Inhale 2 puffs into the lungs every 6 (six) hours as needed for wheezing or shortness of breath., Disp: 1 Inhaler, Rfl: 0 .  aspirin EC 81 MG tablet, Take 81 mg by mouth daily., Disp: , Rfl:  .  azithromycin (ZITHROMAX) 500 MG tablet, Take 1 tablet (500 mg total) by mouth daily., Disp: 4 tablet, Rfl: 0 .  budesonide-formoterol (SYMBICORT) 160-4.5 MCG/ACT inhaler, Inhale 2 puffs into the lungs 2 (two) times daily., Disp: , Rfl:  .  cetirizine (ZYRTEC) 10 MG tablet, Take 10 mg by mouth daily., Disp: , Rfl:  .  desloratadine (CLARINEX) 5 MG tablet, Take 5 mg by mouth daily., Disp: , Rfl:  .  ferrous sulfate 325 (65 FE) MG tablet, Take 325 mg by mouth daily with breakfast., Disp: , Rfl:  .  fluconazole (DIFLUCAN) 150 MG tablet, Take 1 tablet (150 mg total) by mouth once. (Patient not taking: Reported on 08/23/2016), Disp: 2 tablet, Rfl: 1 .  guaiFENesin-dextromethorphan (ROBITUSSIN DM) 100-10 MG/5ML syrup, Take 5  mLs by mouth every 4 (four) hours as needed for cough., Disp: 118 mL, Rfl: 0 .  ipratropium (ATROVENT) 0.02 % nebulizer solution, Take 0.5 mg by nebulization 4 (four) times daily., Disp: , Rfl:  .  levothyroxine (SYNTHROID, LEVOTHROID) 88 MCG tablet, Take 88 mcg by mouth daily before breakfast., Disp: , Rfl:  .  lisinopril (PRINIVIL,ZESTRIL) 40 MG tablet, Take 40 mg by mouth daily., Disp: , Rfl:  .  montelukast (SINGULAIR) 10 MG tablet, Take 10 mg by mouth at bedtime., Disp: , Rfl:  .  omeprazole (PRILOSEC) 10 MG capsule, Take 20 mg by mouth daily., Disp: , Rfl:  .  pravastatin (PRAVACHOL) 40 MG tablet, Take 40 mg by mouth daily., Disp: , Rfl:  .  predniSONE (DELTASONE) 10 MG tablet, Take 1 tablet (10 mg total) by mouth daily., Disp: 42 tablet, Rfl: 0 .  Prenatal Vit-Fe Fumarate-FA (MULTIVITAMIN-PRENATAL) 27-0.8 MG TABS tablet, Take 1 tablet by mouth daily at 12 noon., Disp: , Rfl:  .  sulfamethoxazole-trimethoprim (BACTRIM DS,SEPTRA DS) 800-160 MG tablet, Take 1 tablet by mouth 2 (two) times daily., Disp: 14 tablet, Rfl: 0 .  traMADol (ULTRAM) 50 MG tablet, Take 100 mg by mouth as needed (for rheumatoid arthritis)., Disp: , Rfl:  .  verapamil (CALAN) 120 MG tablet, Take 240 mg by mouth 2 (two) times daily., Disp: , Rfl:   Past Medical History: Past Medical History:  Diagnosis Date  . Asthma   .  Hypertension   . RA (rheumatoid arthritis) (HCC)     Tobacco Use: Social History   Tobacco Use  Smoking Status Never Smoker  Smokeless Tobacco Never Used    Labs: Recent Review Flowsheet Data    There is no flowsheet data to display.       Pulmonary Assessment Scores: Pulmonary Assessment Scores    Row Name 07/20/17 1619 11/18/17 1025       ADL UCSD   ADL Phase  Entry  Mid    SOB Score total  40  44    Rest  1  1    Walk  2  3    Stairs  4  3    Bath  1  2    Dress  2  3    Shop  3  3      mMRC Score   mMRC Score  4  -       Pulmonary Function Assessment: Pulmonary  Function Assessment - 07/20/17 1657      Pulmonary Function Tests   FVC%  62 %    FEV1%  39 %    FEV1/FVC Ratio  50       Exercise Target Goals:    Exercise Program Goal: Individual exercise prescription set with THRR, safety & activity barriers. Participant demonstrates ability to understand and report RPE using BORG scale, to self-measure pulse accurately, and to acknowledge the importance of the exercise prescription.  Exercise Prescription Goal: Starting with aerobic activity 30 plus minutes a day, 3 days per week for initial exercise prescription. Provide home exercise prescription and guidelines that participant acknowledges understanding prior to discharge.  Activity Barriers & Risk Stratification: Activity Barriers & Cardiac Risk Stratification - 07/20/17 1613      Activity Barriers & Cardiac Risk Stratification   Activity Barriers  Arthritis;Deconditioning;Muscular Weakness;Shortness of Breath;Joint Problems Rhuematoid Arthritis, joints swell (especially ankles, hips, and knees0       6 Minute Walk: 6 Minute Walk    Row Name 07/20/17 1611         6 Minute Walk   Phase  Initial     Distance  1122 feet     Walk Time  6 minutes     # of Rest Breaks  0     MPH  2.13     METS  2.4     RPE  13     Perceived Dyspnea   3     VO2 Peak  8.41     Symptoms  Yes (comment)     Comments  SOB, chest tightness with breathing heavier     Resting HR  75 bpm     Resting BP  138/64     Max Ex. HR  105 bpm     Max Ex. BP  134/56     2 Minute Post BP  130/60       Interval HR   Baseline HR (retired)  75     3 Minute HR  105     6 Minute HR  105     2 Minute Post HR  75     Interval Heart Rate?  Yes pulse oximeter did not pick up heart rate very well       Interval Oxygen   Interval Oxygen?  Yes     Baseline Oxygen Saturation %  95 %     Resting Liters of Oxygen  0 L Room Air  1 Minute Oxygen Saturation %  94 %     1 Minute Liters of Oxygen  0 L     2 Minute Oxygen  Saturation %  93 %     2 Minute Liters of Oxygen  0 L     3 Minute Oxygen Saturation %  95 %     3 Minute Liters of Oxygen  0 L     4 Minute Oxygen Saturation %  95 %     4 Minute Liters of Oxygen  0 L     5 Minute Oxygen Saturation %  96 %     5 Minute Liters of Oxygen  0 L     6 Minute Oxygen Saturation %  96 %     6 Minute Liters of Oxygen  0 L     2 Minute Post Oxygen Saturation %  98 %     2 Minute Post Liters of Oxygen  0 L       Oxygen Initial Assessment: Oxygen Initial Assessment - 07/20/17 1655      Home Oxygen   Home Oxygen Device  None    Sleep Oxygen Prescription  None    Home Exercise Oxygen Prescription  None    Home at Rest Exercise Oxygen Prescription  None      Initial 6 min Walk   Oxygen Used  None      Intervention   Short Term Goals  To learn and understand importance of monitoring SPO2 with pulse oximeter and demonstrate accurate use of the pulse oximeter.;To Learn and understand importance of maintaining oxygen saturations>88%;To learn and demonstrate proper purse lipped breathing techniques or other breathing techniques.;To learn and demonstrate proper use of respiratory medications    Long  Term Goals  Maintenance of O2 saturations>88%;Compliance with respiratory medication;Demonstrates proper use of MDI's;Exhibits proper breathing techniques, such as purse lipped breathing or other method taught during program session;Verbalizes importance of monitoring SPO2 with pulse oximeter and return demonstration       Oxygen Re-Evaluation: Oxygen Re-Evaluation    Row Name 08/17/17 1131 10/07/17 1059 10/26/17 1650         Program Oxygen Prescription   Program Oxygen Prescription  -  None  None       Home Oxygen   Home Oxygen Device  -  None  None     Sleep Oxygen Prescription  -  None  CPAP has not been using     Liters per minute  -  -  0     Home Exercise Oxygen Prescription  -  None  None     Home at Rest Exercise Oxygen Prescription  -  None  None      Compliance with Home Oxygen Use  -  -  No does not wear CPAP       Goals/Expected Outcomes   Short Term Goals  To learn and understand importance of monitoring SPO2 with pulse oximeter and demonstrate accurate use of the pulse oximeter.;To learn and demonstrate proper purse lipped breathing techniques or other breathing techniques.;To Learn and understand importance of maintaining oxygen saturations>88%;To learn and demonstrate proper use of respiratory medications;To learn and exhibit compliance with exercise, home and travel O2 prescription  To learn and understand importance of maintaining oxygen saturations>88%;To learn and demonstrate proper use of respiratory medications;To learn and demonstrate proper pursed lip breathing techniques or other breathing techniques.;To learn and understand importance of monitoring SPO2 with pulse oximeter and demonstrate accurate use  of the pulse oximeter.  To learn and demonstrate proper use of respiratory medications;To learn and understand importance of maintaining oxygen saturations>88%;To learn and demonstrate proper pursed lip breathing techniques or other breathing techniques.;To learn and understand importance of monitoring SPO2 with pulse oximeter and demonstrate accurate use of the pulse oximeter.     Long  Term Goals  Exhibits proper breathing techniques, such as purse lipped breathing or other method taught during program session;Maintenance of O2 saturations>88%;Compliance with respiratory medication;Exhibits compliance with exercise, home and travel O2 prescription;Verbalizes importance of monitoring SPO2 with pulse oximeter and return demonstration  Verbalizes importance of monitoring SPO2 with pulse oximeter and return demonstration;Exhibits proper breathing techniques, such as pursed lip breathing or other method taught during program session;Demonstrates proper use of MDI's;Compliance with respiratory medication;Maintenance of O2 saturations>88%   Verbalizes importance of monitoring SPO2 with pulse oximeter and return demonstration;Exhibits proper breathing techniques, such as pursed lip breathing or other method taught during program session;Demonstrates proper use of MDI's;Compliance with respiratory medication;Maintenance of O2 saturations>88%     Comments  On patient's first day of exercise pursed lip breathing was discussed and demonstrated as well as instruction on using the pulse oximeter and proper oxygen saturations during rest and exercise. Patient demonstrated understanding of these concepts.   Ashonti has to get a sleep study done. She states that her CPAP is old and has not worn it in awhile. She also needs to get a pulse oximeter to check her oxygen at home. Informed patient that she needs to keep her oxygen saturation above 88 percent. Dalayla needs to work on PLB outside of class and in class. She keeps a spacer with her to take her albuterol when needed. Rosette also uses her daily maintinence inhaler as prescribed.  Tanaja has been taking her respiratory medications as prescribed. Her shortness of breath has been a little better since the start of [facility].  She has yet to perfect PLB.      Goals/Expected Outcomes  Short: Patient will use PLB techniques in pulmonary rehab to help control SOB and maintain acceptable oygen saturations. Long: Patient will independenly use respiratory medications and breathing techniques to manage SOB and oxygen saturations during daily life activities. at home and during pulmonary rehab.   Short: Obtain a pulse oximter. Long: Independently check oxygen at home.  Short. Work on PLB. Long: Be independent with PLB        Oxygen Discharge (Final Oxygen Re-Evaluation): Oxygen Re-Evaluation - 10/26/17 1650      Program Oxygen Prescription   Program Oxygen Prescription  None      Home Oxygen   Home Oxygen Device  None    Sleep Oxygen Prescription  CPAP has not been using    Liters per minute  0    Home  Exercise Oxygen Prescription  None    Home at Rest Exercise Oxygen Prescription  None    Compliance with Home Oxygen Use  No does not wear CPAP      Goals/Expected Outcomes   Short Term Goals  To learn and demonstrate proper use of respiratory medications;To learn and understand importance of maintaining oxygen saturations>88%;To learn and demonstrate proper pursed lip breathing techniques or other breathing techniques.;To learn and understand importance of monitoring SPO2 with pulse oximeter and demonstrate accurate use of the pulse oximeter.    Long  Term Goals  Verbalizes importance of monitoring SPO2 with pulse oximeter and return demonstration;Exhibits proper breathing techniques, such as pursed lip breathing or other method taught  during program session;Demonstrates proper use of MDI's;Compliance with respiratory medication;Maintenance of O2 saturations>88%    Comments  Evynn has been taking her respiratory medications as prescribed. Her shortness of breath has been a little better since the start of [facility].  She has yet to perfect PLB.     Goals/Expected Outcomes  Short. Work on PLB. Long: Be independent with PLB       Initial Exercise Prescription: Initial Exercise Prescription - 07/20/17 1600      Date of Initial Exercise RX and Referring Provider   Date  07/20/17    Referring Provider  [facility]      Treadmill   MPH  1.8    Grade  0    Minutes  15    METs  2.38      NuStep   Level  1    SPM  80    Minutes  15    METs  2      REL-XR   Level  1    Speed  50    Minutes  15    METs  2      Prescription Details   Frequency (times per week)  3    Duration  Progress to 45 minutes of aerobic exercise without signs/symptoms of physical distress      Intensity   THRR 40-80% of Max Heartrate  108-141    Ratings of Perceived Exertion  11-13    Perceived Dyspnea  0-4      Progression   Progression  Continue to progress workloads to maintain intensity without  signs/symptoms of physical distress.      Resistance Training   Training Prescription  Yes    Weight  2 lbs    Reps  10-15       Perform Capillary Blood Glucose checks as needed.  Exercise Prescription Changes: Exercise Prescription Changes    Row Name 08/26/17 1100 10/07/17 1200 10/09/17 1100 10/21/17 1500 11/04/17 1100     Response to Exercise   Blood Pressure (Admit)  126/70  164/74  -  146/82  128/78   Blood Pressure (Exercise)  148/92  156/74  -  -  -   Blood Pressure (Exit)  126/64  138/64  -  134/84  126/74   Heart Rate (Admit)  85 bpm  83 bpm  -  85 bpm  81 bpm   Heart Rate (Exercise)  148 bpm  138 bpm  -  118 bpm  128 bpm   Heart Rate (Exit)  86 bpm  89 bpm  -  81 bpm  87 bpm   Oxygen Saturation (Admit)  96 %  97 %  -  95 %  95 %   Oxygen Saturation (Exercise)  96 %  95 %  -  94 %  94 %   Oxygen Saturation (Exit)  95 %  93 %  -  95 %  96 %   Rating of Perceived Exertion (Exercise)  12  11  -  12  13   Perceived Dyspnea (Exercise)  1  1  -  1  1   Symptoms  none  none  -  none  none   Duration  Progress to 45 minutes of aerobic exercise without signs/symptoms of physical distress  Continue with 45 min of aerobic exercise without signs/symptoms of physical distress.  -  Continue with 45 min of aerobic exercise without signs/symptoms of physical distress.  Continue with 45 min of  aerobic exercise without signs/symptoms of physical distress.   Intensity  THRR unchanged  THRR unchanged  -  THRR unchanged  THRR unchanged     Progression   Progression  Continue to progress workloads to maintain intensity without signs/symptoms of physical distress.  Continue to progress workloads to maintain intensity without signs/symptoms of physical distress.  -  Continue to progress workloads to maintain intensity without signs/symptoms of physical distress.  Continue to progress workloads to maintain intensity without signs/symptoms of physical distress.   Average METs  2.25  2.6  -  2.4  3.35      Resistance Training   Training Prescription  Yes  Yes  -  Yes  Yes   Weight  2 lb  2 lb  -  2 lb  2 lb   Reps  10-15  10-15  -  10-15  10-15     Interval Training   Interval Training  -  No  -  No  No     Treadmill   MPH  2  2.4  -  -  2.6   Grade  0  1  -  -  2   Minutes  15  15  -  -  15   METs  2.52  3.17  -  -  3.71     NuStep   Level  -  3  -  3  3   SPM  -  96  -  92  81   Minutes  -  15  -  15  15   METs  -  3  -  3.3  3     REL-XR   Level  1  1  -  2  -   Speed  53  60  -  48  -   Minutes  15  15  -  15  -   METs  2.1  1.6  -  1.5  -     Home Exercise Plan   Plans to continue exercise at  -  -  Home (comment) walking  Home (comment) walking  Home (comment) walking   Frequency  -  -  Add 1 additional day to program exercise sessions.  Add 1 additional day to program exercise sessions.  Add 1 additional day to program exercise sessions.   Initial Home Exercises Provided  -  -  10/09/17  10/09/17  10/09/17   Row Name 11/18/17 1200             Response to Exercise   Blood Pressure (Admit)  122/60       Blood Pressure (Exit)  122/70       Heart Rate (Admit)  94 bpm       Heart Rate (Exercise)  101 bpm       Heart Rate (Exit)  67 bpm       Oxygen Saturation (Admit)  97 %       Oxygen Saturation (Exercise)  96 %       Oxygen Saturation (Exit)  96 %       Rating of Perceived Exertion (Exercise)  13       Perceived Dyspnea (Exercise)  1       Symptoms  none       Duration  Continue with 45 min of aerobic exercise without signs/symptoms of physical distress.       Intensity  THRR unchanged  Progression   Progression  Continue to progress workloads to maintain intensity without signs/symptoms of physical distress.       Average METs  2.7         Resistance Training   Training Prescription  Yes       Weight  3 lb       Reps  10-15         Interval Training   Interval Training  No         NuStep   Level  4       SPM  73       Minutes  15        METs  2.8         REL-XR   Level  4       Speed  42       Minutes  15       METs  2.6         Home Exercise Plan   Plans to continue exercise at  Home (comment) walking       Frequency  Add 1 additional day to program exercise sessions.       Initial Home Exercises Provided  10/09/17          Exercise Comments: Exercise Comments    Row Name 08/17/17 1130           Exercise Comments  First full day of exercise!  Patient was oriented to gym and equipment including functions, settings, policies, and procedures.  Patient's individual exercise prescription and treatment plan were reviewed.  All starting workloads were established based on the results of the 6 minute walk test done at initial orientation visit.  The plan for exercise progression was also introduced and progression will be customized based on patient's performance and goals          Exercise Goals and Review: Exercise Goals    Row Name 07/20/17 1615             Exercise Goals   Increase Physical Activity  Yes       Intervention  Provide advice, education, support and counseling about physical activity/exercise needs.;Develop an individualized exercise prescription for aerobic and resistive training based on initial evaluation findings, risk stratification, comorbidities and participant's personal goals.       Expected Outcomes  Achievement of increased cardiorespiratory fitness and enhanced flexibility, muscular endurance and strength shown through measurements of functional capacity and personal statement of participant.       Increase Strength and Stamina  Yes       Intervention  Provide advice, education, support and counseling about physical activity/exercise needs.;Develop an individualized exercise prescription for aerobic and resistive training based on initial evaluation findings, risk stratification, comorbidities and participant's personal goals.       Expected Outcomes  Achievement of increased  cardiorespiratory fitness and enhanced flexibility, muscular endurance and strength shown through measurements of functional capacity and personal statement of participant.          Exercise Goals Re-Evaluation : Exercise Goals Re-Evaluation    [place] Name 08/17/17 1141 08/26/17 1202 09/09/17 1208 10/07/17 1254 10/09/17 1140     Exercise Goal Re-Evaluation   Exercise Goals Review  Increase Physical Activity;Increase Strenth and Stamina  Increase Physical Activity  -  Increase Physical Activity;Increase Strength and Stamina;Able to understand and use rate of perceived exertion (RPE) scale;Able to understand and use Dyspnea scale  Increase Physical Activity;Increase Strength and Stamina;Knowledge and understanding  of Target Heart Rate Range (THRR);Able to understand and use rate of perceived exertion (RPE) scale;Understanding of Exercise Prescription   Comments  Margarete came to her first day of exercise today. She had been out for several week since her initail medical review.   [place] attended her first two sessions.  She tolerated exercise well.    Kelcy has not attended since 8/22.  Sandy is tolerating exercise well.  She has increased levels on all machines.  Reviewed home exercise with pt today.  Pt plans to walk an extra day at home for exercise.  Reviewed THR, pulse, RPE, sign and symptoms, NTG use, and when to call 911 or MD.  Also discussed weather considerations and indoor options.  Pt voiced understanding   Expected Outcomes  Short: Tayanna will be consistant with her attendance to pulmonary rehab. Long: With consistant attendance and exercise Audra will gain strength and stamina to progress with her exercise in rehab and other ADL's.   Short - Ralyn will attend LW regularly.  Long - Aury will improve overall fitness.  -  Short - Carmin will continue to attend regularly.  Long - Leahna will continue to improve overall fitness level.  Short: add 1 extra day of walking to home exercise. Long:  Maintain a home exercise routine and add additional days of walking   Row Name 10/21/17 1523 11/04/17 1110 11/18/17 1217         Exercise Goal Re-Evaluation   Exercise Goals Review  Increase Physical Activity;Increase Strength and Stamina  Increase Physical Activity;Increase Strength and Stamina;Able to understand and use rate of perceived exertion (RPE) scale;Able to understand and use Dyspnea scale  Increase Physical Activity;Increase Strength and Stamina;Able to understand and use rate of perceived exertion (RPE) scale;Able to understand and use Dyspnea scale     Comments  Layza is tolerating exercise well and has increased resistance on all equipment.    Madison is progressing well with exercise.  She has increased overall MET level.  Legacy is progressing well with exercise.  She has seen some weight loss.  She is slowly increasing her levels on machines.     Expected Outcomes  Short - Barri will continue to attend consistently.  Long - Denea will see continued improvements in MET level.  Short - Aranza will continue to progress workloads. Long - Lashika willl maintain fitness improvements.  Short - Richell will continue to attend regularly.  Long - Yury will continue to improve overall fitness levels.        Discharge Exercise Prescription (Final Exercise Prescription Changes): Exercise Prescription Changes - 11/18/17 1200      Response to Exercise   Blood Pressure (Admit)  122/60    Blood Pressure (Exit)  122/70    Heart Rate (Admit)  94 bpm    Heart Rate (Exercise)  101 bpm    Heart Rate (Exit)  67 bpm    Oxygen Saturation (Admit)  97 %    Oxygen Saturation (Exercise)  96 %    Oxygen Saturation (Exit)  96 %    Rating of Perceived Exertion (Exercise)  13    Perceived Dyspnea (Exercise)  1    Symptoms  none    Duration  Continue with 45 min of aerobic exercise without signs/symptoms of physical distress.    Intensity  THRR unchanged      Progression   Progression  Continue to  progress workloads to maintain intensity without signs/symptoms of physical distress.    Average METs  2.7      Resistance Training   Training Prescription  Yes    Weight  3 lb    Reps  10-15      Interval Training   Interval Training  No      NuStep   Level  4    SPM  73    Minutes  15    METs  2.8      REL-XR   Level  4    Speed  42    Minutes  15    METs  2.6      Home Exercise Plan   Plans to continue exercise at  Home (comment) walking    Frequency  Add 1 additional day to program exercise sessions.    Initial Home Exercises Provided  10/09/17       Nutrition:  Target Goals: Understanding of nutrition guidelines, daily intake of sodium <1550m, cholesterol <2029m calories 30% from fat and 7% or less from saturated fats, daily to have 5 or more servings of fruits and vegetables.  Biometrics: Pre Biometrics - 07/20/17 1615      Pre Biometrics   Height  5' 2.2" (1.58 m)    Weight  211 lb 3.2 oz (95.8 kg)    Waist Circumference  41 inches    Hip Circumference  43.5 inches    Waist to Hip Ratio  0.94 %    BMI (Calculated)  38.5        Nutrition Therapy Plan and Nutrition Goals: Nutrition Therapy & Goals - 10/29/17 1755      Nutrition Therapy   Diet  basic healthy diet for weight loss, low sodium    Drug/Food Interactions  Statins/Certain Fruits    Protein (specify units)  8oz    Fruits and Vegetables  5 servings/day    Sodium  1500 grams      Personal Nutrition Goals   Personal Goal #2  Continue with current healthy food choices and regular exercise    Personal Goal #3  Use food lists and menus given to help keep nutritionally balanced meals and include variety of choices    Personal Goal #4  Keep working to limit sweets by eating small portions and limit how often; try tracking intake of sweets to stay aware of how much and how often you eat them.     Comments  Ms. Kot has made significant positive changes on her own, and reports weight loss of 15lbs  since starting [facility] classes. She is motivated to continue.        Nutrition Discharge: Rate Your Plate Scores:   Nutrition Goals Re-Evaluation: Nutrition Goals Re-Evaluation    Row Name 08/17/17 1139 10/09/17 1415 10/26/17 1658         Goals   Current Weight  206 lb (93.4 kg)  201 lb (91.2 kg)  196 lb (88.9 kg)     Nutrition Goal  Has a scheduled appointment to meet with the dietician on 09/10/17 to set more specific nutrition goal.   Lose weight and adhere to a healthier diet.  Adhere to a diet plan and continue to lose weight.     Comment  -  BrJazsmintates she has been eating more vegetables and salad. She has been eating less red meat. She met with the dietician and has a good outlook for what to eat.  BrMarlisas been trying to lose weight. So far she has done a great job. She states she has been  eating smaller portions which has helped her alot. Her appointment for the Nutritionist is November 1st.     Expected Outcome  -  Short: lose weight by eating healthy. Long: Adhere to a diet plan to maintain weight loss.  Short:meet with the nutritionist. Long: Maintain a healthy diet.        Nutrition Goals Discharge (Final Nutrition Goals Re-Evaluation): Nutrition Goals Re-Evaluation - 10/26/17 1658      Goals   Current Weight  196 lb (88.9 kg)    Nutrition Goal  Adhere to a diet plan and continue to lose weight.    Comment  Zeah has been trying to lose weight. So far she has done a great job. She states she has been eating smaller portions which has helped her alot. Her appointment for the Nutritionist is November 1st.    Expected Outcome  Short:meet with the nutritionist. Long: Maintain a healthy diet.       Psychosocial: Target Goals: Acknowledge presence or absence of significant depression and/or stress, maximize coping skills, provide positive support system. Participant is able to verbalize types and ability to use techniques and skills needed for reducing stress and  depression.   Initial Review & Psychosocial Screening: Initial Psych Review & Screening - 07/20/17 1700      Initial Review   Current issues with  None Identified      Family Dynamics   Good Support System?  Yes      Barriers   Psychosocial barriers to participate in program  The patient should benefit from training in stress management and relaxation.      Screening Interventions   Interventions  Program counselor consult       Quality of Life Scores: Quality of Life - 07/20/17 1620      Quality of Life Scores   Health/Function Pre  15.25 %    Socioeconomic Pre  19.36 %    Psych/Spiritual Pre  19.79 %    Family Pre  21.25 %    GLOBAL Pre  17.74 %       PHQ-9: Recent Review Flowsheet Data    Depression screen [facility] 2/9 11/18/2017 09/30/2017 07/20/2017   Decreased Interest _0 Down, Depressed, Hopeless 1 0 0   PHQ - 2 Score _1 Altered sleeping _2 Tired, decreased energy _3 Change in appetite _4 Feeling bad or failure about yourself  0 1 1   Trouble concentrating _5 Moving slowly or fidgety/restless 0 0 0   Suicidal thoughts 0 0 0   PHQ-9 Score _6 Difficult doing work/chores Somewhat difficult Somewhat difficult Somewhat difficult     Interpretation of Total Score  Total Score Depression Severity:  1-4 = Minimal depression, 5-9 = Mild depression, 10-14 = Moderate depression, 15-19 = Moderately severe depression, 20-27 = Severe depression   Psychosocial Evaluation and Intervention: Psychosocial Evaluation - 08/17/17 1028      Psychosocial Evaluation & Interventions   Interventions  Encouraged to exercise with the program and follow exercise prescription;Relaxation education;Stress management education    Comments  Counselor met with Ms. Sunday Corn today Hassan Rowan) for initial psychosocial evaluation.  She is a 66 year old who has asthma and many other health issues, including RA; Fibromyalgia; HBP and High Cholesterol; obesity and she had  her thyroid removed approximately 10 years ago.  An has a strong support  system with a spouse of 22 years and she is actively involved in her local church.  Oniya has chronic sleep problems with maybe 4 nights of 4-6 hours per week on average.  She denies a history of depression or anxiety or any current symptoms and states she is typically in a positive mood most of the time.  Dameshia has multiple stressors with her health; caring for her mother with dementia and finances.  She has goals to lose weight and increase her stamina and strength.  Grizelda will be followed by staff throughout the course of this program.      Expected Outcomes  Roschelle will benefit from consistent exercise to achieve her stated goals.  She will be seeing the dietician who will help her address her weight loss goals.  Dannetta will also benefit from the educational and psychoeducational components of this program to help her manage her medical issues and cope better with her stressors.      Continue Psychosocial Services   Follow up required by staff       Psychosocial Re-Evaluation: Psychosocial Re-Evaluation    [place] Name 09/30/17 1129 10/26/17 1702 11/18/17 1052         Psychosocial Re-Evaluation   Current issues with  Current Stress Concerns;Current Sleep Concerns  Current Stress Concerns;Current Sleep Concerns  Current Depression;Current Sleep Concerns;Current Stress Concerns     Comments  Counselor follow up with Hassan Rowan today reporting her sleep has improved slightly since she came into the program. 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  Mariaguadalupe has been out of class for several weeks due to her mother's health; which resulted in her mother going into a nursing home during that time.  Conni reports taking that opportunity to focus on her personal self-care and attended to much needed medical appointments.  Also she has been  able to have additional help for her mother in place.  This reduces her stress.  Calleigh reports exercising also helps reduce her stress and she is happy to be back in class.  She received a phone call from a Dr.'s office while meeting with counselor and became a little stressed about that interruption.  Counselor encouraged Saarah to turn her phone off during her self-care time - like this class.  Her spouse knows how to care for Larenda's mother and she agreed this would help her focus more and enjoy the class more.  Annaleigh states finances continue to be a stressor but her spouse may be getting disability soon and that will be helpful.  Counselor commended Elsie for her progress made and commitment to exercise and positive self-care.   Loda's current stressor is taking care of her mom with dementia. She enjoys class but sometimes cant focus like she would like to.  Counselor follow up with Johnathon today reporting has a "stress headache" today.  Processed the stress in her life and states she continues to be up and down all night so her sleep continues to be interrupted.  She also continues to be the primary caregiver for her mother and that is stressful as well.  Deepa reports her energy level has improved and she is more comfortable exercising and loves the self-care/social aspects of this program.  She states her "attitude has improved" since coming more consistently.  She also has lost about 13 pounds which was one of her goals!  She mentioned having a stress  fracture in her left foot and may need a boot for that.  Doesn't know if this will impact her ability to complete this program currently.  Counselor commended Rye for all her hard work and progress made.      Expected Outcomes  Arista will continue to exercise consistently.  She will begin to set better limits on her time in class by turning her phone off to decrease her stress and improve her self-care.  She will need to meet with the dietician since  she missed that appointment while out with her mother.    Short: attend [facility] regularly to decrease stress. Long: Maintain an exercise to keep stress to a minimum.  Skyy will continue to exercise to help with stress and improved health.  She was encouraged to address her sleep issues with her Dr. as it has been chronic - but she reports caring for her mother impacts her ability to rest well overall.  Tiyona will continue to practice stress management techniques shared in the educational component of this class.       Interventions  -  Encouraged to attend [facility] for the exercise  Stress management education;Relaxation education     Continue Psychosocial Services   Follow up required by staff  Follow up required by staff  Follow up required by staff       Initial Review   Source of Stress Concerns  Family;Financial  -  -        Psychosocial Discharge (Final Psychosocial Re-Evaluation): Psychosocial Re-Evaluation - 11/18/17 1052      Psychosocial Re-Evaluation   Current issues with  Current Depression;Current Sleep Concerns;Current Stress Concerns    Comments  Counselor follow up with Sumer today reporting has a "stress headache" today.  Processed the stress in her life and states she continues to be up and down all night so her sleep continues to be interrupted.  She also continues to be the primary caregiver for her mother and that is stressful as well.  Edna reports her energy level has improved and she is more comfortable exercising and loves the self-care/social aspects of this program.  She states her "attitude has improved" since coming more consistently.  She also has lost about 13 pounds which was one of her goals!  She mentioned having a stress fracture in her left foot and may need a boot for that.  Doesn't know if this will impact her ability to complete this program currently.  Counselor commended Ernesta for all her hard work and progress made.     Expected  Outcomes  Rudell will continue to exercise to help with stress and improved health.  She was encouraged to address her sleep issues with her Dr. as it has been chronic - but she reports caring for her mother impacts her ability to rest well overall.  Gay will continue to practice stress management techniques shared in the educational component of this class.      Interventions  Stress management education;Relaxation education    Continue Psychosocial Services   Follow up required by staff       Education: Education Goals: Education classes will be provided on a weekly basis, covering required topics. Participant will state understanding/return demonstration of topics presented.  Learning Barriers/Preferences: Learning Barriers/Preferences - 07/20/17 1656      Learning Barriers/Preferences   Learning Barriers  None    Learning Preferences  None       Education Topics: Initial Evaluation Education: - Verbal, written  and demonstration of respiratory meds, RPE/PD scales, oximetry and breathing techniques. Instruction on use of nebulizers and MDIs: cleaning and proper use, rinsing mouth with steroid doses and importance of monitoring MDI activations.   Pulmonary Rehab from 11/18/2017 in [facility] Cardiac and Pulmonary Rehab  Date  07/20/17  Educator  [facility]  Instruction Review Code (retired)  2- meets goals/outcomes  Instruction Review Code  1- IT trainer Nutrition Guidelines/Fats and Fiber: -Group instruction provided by verbal, written material, models and posters to present the general guidelines for heart healthy nutrition. Gives an explanation and review of dietary fats and fiber.   Pulmonary Rehab from 11/18/2017 in [facility] Cardiac and Pulmonary Rehab  Date  08/17/17  Educator  CR      Controlling Sodium/Reading Food Labels: -Group verbal and written material supporting the discussion of sodium use in heart healthy nutrition. Review and explanation with  models, verbal and written materials for utilization of the food label.   Pulmonary Rehab from 11/18/2017 in [facility] Cardiac and Pulmonary Rehab  Date  10/19/17  Educator  CR  Instruction Review Code  5- Refused Teaching      Exercise Physiology & Risk Factors: - Group verbal and written instruction with models to review the exercise physiology of the cardiovascular system and associated critical values. Details cardiovascular disease risk factors and the goals associated with each risk factor.   Pulmonary Rehab from 11/18/2017 in [facility] Cardiac and Pulmonary Rehab  Date  10/02/17  Educator  Nada Maclachlan, EP  Instruction Review Code  1- Verbalizes Understanding      Aerobic Exercise & Resistance Training: - Gives group verbal and written discussion on the health impact of inactivity. On the components of aerobic and resistive training programs and the benefits of this training and how to safely progress through these programs.   Pulmonary Rehab from 11/18/2017 in [facility] Cardiac and Pulmonary Rehab  Date  10/30/17  Educator  [facility]  Instruction Review Code  1- Verbalizes Understanding      Flexibility, Balance, General Exercise Guidelines: - Provides group verbal and written instruction on the benefits of flexibility and balance training programs. Provides general exercise guidelines with specific guidelines to those with heart or lung disease. Demonstration and skill practice provided.   Pulmonary Rehab from 11/18/2017 in [facility] Cardiac and Pulmonary Rehab  Date  11/18/17  Educator  [facility]  Instruction Review Code  1- Verbalizes Understanding      Stress Management: - Provides group verbal and written instruction about the health risks of elevated stress, cause of high stress, and healthy ways to reduce stress.   Depression: - Provides group verbal and written instruction on the correlation between heart/lung disease and depressed mood, treatment options, and the stigmas associated with  seeking treatment.   Pulmonary Rehab from 11/18/2017 in [facility] Cardiac and Pulmonary Rehab  Date  08/19/17  Educator  [facility]  Instruction Review Code (retired)  2- meets goals/outcomes  Instruction Review Code  1- Science writer Understanding      Exercise & Equipment Safety: - Individual verbal instruction and demonstration of equipment use and safety with use of the equipment.   Pulmonary Rehab from 11/18/2017 in [facility] Cardiac and Pulmonary Rehab  Date  08/17/17  Educator  [facility]  Instruction Review Code  1- Verbalizes Understanding      Infection Prevention: - Provides verbal and written material to individual with discussion of infection control including proper hand washing and proper equipment cleaning during exercise session.  Pulmonary Rehab from 11/18/2017 in [facility] Cardiac and Pulmonary Rehab  Date  07/20/17  Educator  [facility]  Instruction Review Code  1- Verbalizes Understanding      Falls Prevention: - Provides verbal and written material to individual with discussion of falls prevention and safety.   Pulmonary Rehab from 11/18/2017 in [facility] Cardiac and Pulmonary Rehab  Date  07/20/17  Educator  [facility]  Instruction Review Code (retired)  2- meets goals/outcomes  Instruction Review Code  1- Science writer Understanding      Diabetes: - Individual verbal and written instruction to review signs/symptoms of diabetes, desired ranges of glucose level fasting, after meals and with exercise. Advice that pre and post exercise glucose checks will be done for 3 sessions at entry of program.   Pulmonary Rehab from 11/18/2017 in [facility] Cardiac and Pulmonary Rehab  Date  07/20/17  Educator  [facility]  Instruction Review Code  1- Verbalizes Understanding      Chronic Lung Diseases: - Group verbal and written instruction to review new updates, new respiratory medications, new advancements in procedures and treatments. Provide informative websites and "800" numbers of self-education.   Pulmonary Rehab from  11/18/2017 in [facility] Cardiac and Pulmonary Rehab  Date  09/30/17  Educator  [facility]  Instruction Review Code  1- Verbalizes Understanding      Lung Procedures: - Group verbal and written instruction to describe testing methods done to diagnose lung disease. Review the outcome of test results. Describe the treatment choices: Pulmonary Function Tests, ABGs and oximetry.   Energy Conservation: - Provide group verbal and written instruction for methods to conserve energy, plan and organize activities. Instruct on pacing techniques, use of adaptive equipment and posture/positioning to relieve shortness of breath.   Triggers: - Group verbal and written instruction to review types of environmental controls: home humidity, furnaces, filters, dust mite/pet prevention, HEPA vacuums. To discuss weather changes, air quality and the benefits of nasal washing.   Exacerbations: - Group verbal and written instruction to provide: warning signs, infection symptoms, calling MD promptly, preventive modes, and value of vaccinations. Review: effective airway clearance, coughing and/or vibration techniques. Create an Sports administrator.   Oxygen: - Individual and group verbal and written instruction on oxygen therapy. Includes supplement oxygen, available portable oxygen systems, continuous and intermittent flow rates, oxygen safety, concentrators, and Medicare reimbursement for oxygen.   Respiratory Medications: - Group verbal and written instruction to review medications for lung disease. Drug class, frequency, complications, importance of spacers, rinsing mouth after steroid MDI's, and proper cleaning methods for nebulizers.   AED/CPR: - Group verbal and written instruction with the use of models to demonstrate the basic use of the AED with the basic ABC's of resuscitation.   Breathing Retraining: - Provides individuals verbal and written instruction on purpose, frequency, and proper technique of diaphragmatic  breathing and pursed-lipped breathing. Applies individual practice skills.   Pulmonary Rehab from 11/18/2017 in [facility] Cardiac and Pulmonary Rehab  Date  08/17/17  Educator  [facility]  Instruction Review Code  1- Actuary and Physiology of the Lungs: - Group verbal and written instruction with the use of models to provide basic lung anatomy and physiology related to function, structure and complications of lung disease.   Anatomy & Physiology of the Heart: - Group verbal and written instruction and models provide basic cardiac anatomy and physiology, with the coronary electrical and arterial systems. Review of: AMI, Angina, Valve disease, Heart Failure, Cardiac Arrhythmia, Pacemakers, and the  ICD.   Pulmonary Rehab from 11/18/2017 in [facility] Cardiac and Pulmonary Rehab  Date  11/13/17  Educator  [facility]  Instruction Review Code  1- Verbalizes Understanding      Heart Failure: - Group verbal and written instruction on the basics of heart failure: signs/symptoms, treatments, explanation of ejection fraction, enlarged heart and cardiomyopathy.   Sleep Apnea: - Individual verbal and written instruction to review Obstructive Sleep Apnea. Review of risk factors, methods for diagnosing and types of masks and machines for OSA.   Anxiety: - Provides group, verbal and written instruction on the correlation between heart/lung disease and anxiety, treatment options, and management of anxiety.   Relaxation: - Provides group, verbal and written instruction about the benefits of relaxation for patients with heart/lung disease. Also provides patients with examples of relaxation techniques.   Cardiac Medications: - Group verbal and written instruction to review commonly prescribed medications for heart disease. Reviews the medication, class of the drug, and side effects.   Know Your Numbers: -Group verbal and written instruction about important numbers in your health.  Review of  Cholesterol, Blood Pressure, Diabetes, and BMI and the role they play in your overall health.   Pulmonary Rehab from 11/18/2017 in [facility] Cardiac and Pulmonary Rehab  Date  10/09/17  Educator  [facility]  Instruction Review Code  1- Verbalizes Understanding      Other: -Provides group and verbal instruction on various topics (see comments)    Knowledge Questionnaire Score: Knowledge Questionnaire Score - 07/20/17 1618      Knowledge Questionnaire Score   Pre Score  8/10 reviewed with pt today        Core Components/Risk Factors/Patient Goals at Admission: Personal Goals and Risk Factors at Admission - 07/20/17 1653      Core Components/Risk Factors/Patient Goals on Admission    Weight Management  Yes    Intervention  Weight Management: Develop a combined nutrition and exercise program designed to reach desired caloric intake, while maintaining appropriate intake of nutrient and fiber, sodium and fats, and appropriate energy expenditure required for the weight goal.;Weight Management: Provide education and appropriate resources to help participant work on and attain dietary goals.;Weight Management/Obesity: Establish reasonable short term and long term weight goals.    Admit Weight  215 lb (97.5 kg)    Goal Weight: Short Term  210 lb (95.3 kg)    Goal Weight: Long Term  150 lb (68 kg)    Expected Outcomes  Understanding of distribution of calorie intake throughout the day with the consumption of 4-5 meals/snacks;Understanding recommendations for meals to include 15-35% energy as protein, 25-35% energy from fat, 35-60% energy from carbohydrates, less than 239m of dietary cholesterol, 20-35 gm of total fiber daily;Weight Loss: Understanding of general recommendations for a balanced deficit meal plan, which promotes 1-2 lb weight loss per week and includes a negative energy balance of [id number] kcal/d;Weight Maintenance: Understanding of the daily nutrition guidelines, which includes 25-35% calories  from fat, 7% or less cal from saturated fats, less than 2028mcholesterol, less than 1.5gm of sodium, & 5 or more servings of fruits and vegetables daily;Short Term: Continue to assess and modify interventions until short term weight is achieved    Diabetes  Yes    Intervention  Provide education about signs/symptoms and action to take for hypo/hyperglycemia.;Provide education about proper nutrition, including hydration, and aerobic/resistive exercise prescription along with prescribed medications to achieve blood glucose in normal ranges: Fasting glucose 65-99 mg/dL    Expected Outcomes  Short Term: Participant verbalizes understanding of the signs/symptoms and immediate care of hyper/hypoglycemia, proper foot care and importance of medication, aerobic/resistive exercise and nutrition plan for blood glucose control.;Long Term: Attainment of HbA1C < 7%.    Lipids  Yes    Intervention  Provide education and support for participant on nutrition & aerobic/resistive exercise along with prescribed medications to achieve LDL '70mg'$ , HDL >'40mg'$ .    Expected Outcomes  Short Term: Participant states understanding of desired cholesterol values and is compliant with medications prescribed. Participant is following exercise prescription and nutrition guidelines.;Long Term: Cholesterol controlled with medications as prescribed, with individualized exercise RX and with personalized nutrition plan. Value goals: LDL < '70mg'$ , HDL > 40 mg.       Core Components/Risk Factors/Patient Goals Review:  Goals and Risk Factor Review    Row Name 08/17/17 1136 10/16/17 1123 10/26/17 1655         Core Components/Risk Factors/Patient Goals Review   Personal Goals Review  Weight Management/Obesity  Weight Management/Obesity;Improve shortness of breath with ADL's;Diabetes;Stress  Weight Management/Obesity;Stress;Diabetes;Improve shortness of breath with ADL's     Review  Weight loss was discussed with that patient. We discussed how  physical activity and exercise along with dietary changes can aid in this weight loss goal. She was encouraged to make an appointment with the dietician, which she did.   Peityn has been dealing with her mother and her dementia. She has lost a little weight since the start of the program. Some days are better than others with her breathing. She states when it gets cold she can feel it in her lungs. She also does not do well with hot weather.  Murl has been stressed taking care of her mother with dementia. She is down ten pounds since the start of the program.      Expected Outcomes  Short: Filippa has a short term goal of losing 1-2 lbs per week and aiming towards 196 lbs( 10 lbs loss). Nabeeha has will meet with the dietician on 09/10/17 and will continue to consistantly come to  pulmonary rehab for exercise.  Long: Long term weight goal is 150 lbs.   Short: Attend class regularyly to improve ADL's. Long: Maintain routine exercise to improve ADL's independently.  Short: Attemd [facility] to reduce stress. Long: maintain a stress free environment.        Core Components/Risk Factors/Patient Goals at Discharge (Final Review):  Goals and Risk Factor Review - 10/26/17 1655      Core Components/Risk Factors/Patient Goals Review   Personal Goals Review  Weight Management/Obesity;Stress;Diabetes;Improve shortness of breath with ADL's    Review  Shalen has been stressed taking care of her mother with dementia. She is down ten pounds since the start of the program.     Expected Outcomes  Short: Attemd [facility] to reduce stress. Long: maintain a stress free environment.       ITP Comments: ITP Comments    Row Name 07/20/17 1623 07/27/17 0845 07/28/17 [id number] 08/17/17 1038 08/24/17 0811   ITP Comments  Med eval completed.  Documentation for diagnosis can be found in [place] notes that will be sent to medical records.  30 day review completed ITP sent to Dr. Ramonita Lab for Dr. Emily Filbert Director of [place].  Continue with ITP unless changes are made by physician. Patient has not yet had first day of exercise   Ms. Craze called to say she cannot start until Monday August 20th due to her mom needing a Actuary while  her husband is away.  Patient had her first day of exercise. She felt a little short of breath post exercise and took her albuterol MDI. Patient recovered and continued to exercise.  30 day review completed. ITP sent to Dr. Emily Filbert Director of [place]. Continue with ITP unless changes are made by physician.     Row Name 09/09/17 1207 09/21/17 0811 09/23/17 1148 09/23/17 1411 10/19/17 0804   ITP Comments  Cherine has not attended since 8/22.    30 day review completed. ITP sent to Dr. Emily Filbert Director of [place]. Continue with ITP unless changes are made by physician.    Amalea has not attended since 08/19/17.  Called to see when and If Remell would be able to resume [facility]. Message left.  30 day review completed. ITP sent to Dr. Emily Filbert Director of [facility]. Continue with ITP unless changes are made by physician.     Row Name 11/16/17 [phone] 11/27/17 0922 12/11/17 0924 12/14/17 0807     ITP Comments  30 day review completed. ITP sent to Dr. Emily Filbert Director of [place]. Continue with ITP unless changes are made by physician.    Charne called to say that a stress fracture in her foot. Her mom has an appointment today and she will be fitted for a boot for Monday. She hopes to be back on Wednesday.  Chinenye called to say she would be back the first of the year. Her husband is going out of town and her foot has been hurting. She states she will be back in full force after the first of the year.  30 day review completed. ITP sent to Dr. Emily Filbert Director of [place]. Continue with ITP unless changes are made by physician.         Comments: 30 day review

## 2018-01-11 DIAGNOSIS — J4550 Severe persistent asthma, uncomplicated: Secondary | ICD-10-CM

## 2018-01-11 NOTE — Progress Notes (Signed)
Pulmonary Individual Treatment Plan  Patient Details  Name: Saraiya Kozma MRN: [id number] Date of Birth: [birth year] Referring Provider:     Pulmonary Rehab from 07/20/2017 in [facility] Cardiac and Pulmonary Rehab  Referring Provider  [facility]      Initial Encounter Date:    Pulmonary Rehab from 07/20/2017 in [facility] Cardiac and Pulmonary Rehab  Date  07/20/17  Referring Provider  [facility]      Visit Diagnosis: Severe persistent asthma without complication  Patient's Home Medications on Admission:  Current Outpatient Medications:  .  Abatacept 125 MG/ML SOAJ, Take 750 mg by mouth., Disp: , Rfl:  .  albuterol (ACCUNEB) 1.25 MG/3ML nebulizer solution, Take 1 ampule by nebulization every 6 (six) hours as needed for wheezing., Disp: , Rfl:  .  albuterol (PROVENTIL HFA;VENTOLIN HFA) 108 (90 Base) MCG/ACT inhaler, Inhale into the lungs every 6 (six) hours as needed for wheezing or shortness of breath., Disp: , Rfl:  .  albuterol (PROVENTIL HFA;VENTOLIN HFA) 108 (90 Base) MCG/ACT inhaler, Inhale 2 puffs into the lungs every 6 (six) hours as needed for wheezing or shortness of breath., Disp: 1 Inhaler, Rfl: 0 .  aspirin EC 81 MG tablet, Take 81 mg by mouth daily., Disp: , Rfl:  .  azithromycin (ZITHROMAX) 500 MG tablet, Take 1 tablet (500 mg total) by mouth daily., Disp: 4 tablet, Rfl: 0 .  budesonide-formoterol (SYMBICORT) 160-4.5 MCG/ACT inhaler, Inhale 2 puffs into the lungs 2 (two) times daily., Disp: , Rfl:  .  cetirizine (ZYRTEC) 10 MG tablet, Take 10 mg by mouth daily., Disp: , Rfl:  .  desloratadine (CLARINEX) 5 MG tablet, Take 5 mg by mouth daily., Disp: , Rfl:  .  ferrous sulfate 325 (65 FE) MG tablet, Take 325 mg by mouth daily with breakfast., Disp: , Rfl:  .  fluconazole (DIFLUCAN) 150 MG tablet, Take 1 tablet (150 mg total) by mouth once. (Patient not taking: Reported on 08/23/2016), Disp: 2 tablet, Rfl: 1 .  guaiFENesin-dextromethorphan (ROBITUSSIN DM) 100-10 MG/5ML syrup, Take 5  mLs by mouth every 4 (four) hours as needed for cough., Disp: 118 mL, Rfl: 0 .  ipratropium (ATROVENT) 0.02 % nebulizer solution, Take 0.5 mg by nebulization 4 (four) times daily., Disp: , Rfl:  .  levothyroxine (SYNTHROID, LEVOTHROID) 88 MCG tablet, Take 88 mcg by mouth daily before breakfast., Disp: , Rfl:  .  lisinopril (PRINIVIL,ZESTRIL) 40 MG tablet, Take 40 mg by mouth daily., Disp: , Rfl:  .  montelukast (SINGULAIR) 10 MG tablet, Take 10 mg by mouth at bedtime., Disp: , Rfl:  .  omeprazole (PRILOSEC) 10 MG capsule, Take 20 mg by mouth daily., Disp: , Rfl:  .  pravastatin (PRAVACHOL) 40 MG tablet, Take 40 mg by mouth daily., Disp: , Rfl:  .  predniSONE (DELTASONE) 10 MG tablet, Take 1 tablet (10 mg total) by mouth daily., Disp: 42 tablet, Rfl: 0 .  Prenatal Vit-Fe Fumarate-FA (MULTIVITAMIN-PRENATAL) 27-0.8 MG TABS tablet, Take 1 tablet by mouth daily at 12 noon., Disp: , Rfl:  .  sulfamethoxazole-trimethoprim (BACTRIM DS,SEPTRA DS) 800-160 MG tablet, Take 1 tablet by mouth 2 (two) times daily., Disp: 14 tablet, Rfl: 0 .  traMADol (ULTRAM) 50 MG tablet, Take 100 mg by mouth as needed (for rheumatoid arthritis)., Disp: , Rfl:  .  verapamil (CALAN) 120 MG tablet, Take 240 mg by mouth 2 (two) times daily., Disp: , Rfl:   Past Medical History: Past Medical History:  Diagnosis Date  . Asthma   .  Hypertension   . RA (rheumatoid arthritis) (HCC)     Tobacco Use: Social History   Tobacco Use  Smoking Status Never Smoker  Smokeless Tobacco Never Used    Labs: Recent Review Flowsheet Data    There is no flowsheet data to display.       Pulmonary Assessment Scores: Pulmonary Assessment Scores    Row Name 07/20/17 1619 11/18/17 1025       ADL UCSD   ADL Phase  Entry  Mid    SOB Score total  40  44    Rest  1  1    Walk  2  3    Stairs  4  3    Bath  1  2    Dress  2  3    Shop  3  3      mMRC Score   mMRC Score  4  -       Pulmonary Function Assessment: Pulmonary  Function Assessment - 07/20/17 1657      Pulmonary Function Tests   FVC%  62 %    FEV1%  39 %    FEV1/FVC Ratio  50       Exercise Target Goals:    Exercise Program Goal: Individual exercise prescription set with THRR, safety & activity barriers. Participant demonstrates ability to understand and report RPE using BORG scale, to self-measure pulse accurately, and to acknowledge the importance of the exercise prescription.  Exercise Prescription Goal: Starting with aerobic activity 30 plus minutes a day, 3 days per week for initial exercise prescription. Provide home exercise prescription and guidelines that participant acknowledges understanding prior to discharge.  Activity Barriers & Risk Stratification: Activity Barriers & Cardiac Risk Stratification - 07/20/17 1613      Activity Barriers & Cardiac Risk Stratification   Activity Barriers  Arthritis;Deconditioning;Muscular Weakness;Shortness of Breath;Joint Problems Rhuematoid Arthritis, joints swell (especially ankles, hips, and knees0       6 Minute Walk: 6 Minute Walk    Row Name 07/20/17 1611         6 Minute Walk   Phase  Initial     Distance  1122 feet     Walk Time  6 minutes     # of Rest Breaks  0     MPH  2.13     METS  2.4     RPE  13     Perceived Dyspnea   3     VO2 Peak  8.41     Symptoms  Yes (comment)     Comments  SOB, chest tightness with breathing heavier     Resting HR  75 bpm     Resting BP  138/64     Max Ex. HR  105 bpm     Max Ex. BP  134/56     2 Minute Post BP  130/60       Interval HR   Baseline HR (retired)  75     3 Minute HR  105     6 Minute HR  105     2 Minute Post HR  75     Interval Heart Rate?  Yes pulse oximeter did not pick up heart rate very well       Interval Oxygen   Interval Oxygen?  Yes     Baseline Oxygen Saturation %  95 %     Resting Liters of Oxygen  0 L Room Air  1 Minute Oxygen Saturation %  94 %     1 Minute Liters of Oxygen  0 L     2 Minute Oxygen  Saturation %  93 %     2 Minute Liters of Oxygen  0 L     3 Minute Oxygen Saturation %  95 %     3 Minute Liters of Oxygen  0 L     4 Minute Oxygen Saturation %  95 %     4 Minute Liters of Oxygen  0 L     5 Minute Oxygen Saturation %  96 %     5 Minute Liters of Oxygen  0 L     6 Minute Oxygen Saturation %  96 %     6 Minute Liters of Oxygen  0 L     2 Minute Post Oxygen Saturation %  98 %     2 Minute Post Liters of Oxygen  0 L       Oxygen Initial Assessment: Oxygen Initial Assessment - 07/20/17 1655      Home Oxygen   Home Oxygen Device  None    Sleep Oxygen Prescription  None    Home Exercise Oxygen Prescription  None    Home at Rest Exercise Oxygen Prescription  None      Initial 6 min Walk   Oxygen Used  None      Intervention   Short Term Goals  To learn and understand importance of monitoring SPO2 with pulse oximeter and demonstrate accurate use of the pulse oximeter.;To Learn and understand importance of maintaining oxygen saturations>88%;To learn and demonstrate proper purse lipped breathing techniques or other breathing techniques.;To learn and demonstrate proper use of respiratory medications    Long  Term Goals  Maintenance of O2 saturations>88%;Compliance with respiratory medication;Demonstrates proper use of MDI's;Exhibits proper breathing techniques, such as purse lipped breathing or other method taught during program session;Verbalizes importance of monitoring SPO2 with pulse oximeter and return demonstration       Oxygen Re-Evaluation: Oxygen Re-Evaluation    Row Name 08/17/17 1131 10/07/17 1059 10/26/17 1650         Program Oxygen Prescription   Program Oxygen Prescription  -  None  None       Home Oxygen   Home Oxygen Device  -  None  None     Sleep Oxygen Prescription  -  None  CPAP has not been using     Liters per minute  -  -  0     Home Exercise Oxygen Prescription  -  None  None     Home at Rest Exercise Oxygen Prescription  -  None  None      Compliance with Home Oxygen Use  -  -  No does not wear CPAP       Goals/Expected Outcomes   Short Term Goals  To learn and understand importance of monitoring SPO2 with pulse oximeter and demonstrate accurate use of the pulse oximeter.;To learn and demonstrate proper purse lipped breathing techniques or other breathing techniques.;To Learn and understand importance of maintaining oxygen saturations>88%;To learn and demonstrate proper use of respiratory medications;To learn and exhibit compliance with exercise, home and travel O2 prescription  To learn and understand importance of maintaining oxygen saturations>88%;To learn and demonstrate proper use of respiratory medications;To learn and demonstrate proper pursed lip breathing techniques or other breathing techniques.;To learn and understand importance of monitoring SPO2 with pulse oximeter and demonstrate accurate use  of the pulse oximeter.  To learn and demonstrate proper use of respiratory medications;To learn and understand importance of maintaining oxygen saturations>88%;To learn and demonstrate proper pursed lip breathing techniques or other breathing techniques.;To learn and understand importance of monitoring SPO2 with pulse oximeter and demonstrate accurate use of the pulse oximeter.     Long  Term Goals  Exhibits proper breathing techniques, such as purse lipped breathing or other method taught during program session;Maintenance of O2 saturations>88%;Compliance with respiratory medication;Exhibits compliance with exercise, home and travel O2 prescription;Verbalizes importance of monitoring SPO2 with pulse oximeter and return demonstration  Verbalizes importance of monitoring SPO2 with pulse oximeter and return demonstration;Exhibits proper breathing techniques, such as pursed lip breathing or other method taught during program session;Demonstrates proper use of MDI's;Compliance with respiratory medication;Maintenance of O2 saturations>88%   Verbalizes importance of monitoring SPO2 with pulse oximeter and return demonstration;Exhibits proper breathing techniques, such as pursed lip breathing or other method taught during program session;Demonstrates proper use of MDI's;Compliance with respiratory medication;Maintenance of O2 saturations>88%     Comments  On patient's first day of exercise pursed lip breathing was discussed and demonstrated as well as instruction on using the pulse oximeter and proper oxygen saturations during rest and exercise. Patient demonstrated understanding of these concepts.   Ashonti has to get a sleep study done. She states that her CPAP is old and has not worn it in awhile. She also needs to get a pulse oximeter to check her oxygen at home. Informed patient that she needs to keep her oxygen saturation above 88 percent. Dalayla needs to work on PLB outside of class and in class. She keeps a spacer with her to take her albuterol when needed. Rosette also uses her daily maintinence inhaler as prescribed.  Tanaja has been taking her respiratory medications as prescribed. Her shortness of breath has been a little better since the start of [facility].  She has yet to perfect PLB.      Goals/Expected Outcomes  Short: Patient will use PLB techniques in pulmonary rehab to help control SOB and maintain acceptable oygen saturations. Long: Patient will independenly use respiratory medications and breathing techniques to manage SOB and oxygen saturations during daily life activities. at home and during pulmonary rehab.   Short: Obtain a pulse oximter. Long: Independently check oxygen at home.  Short. Work on PLB. Long: Be independent with PLB        Oxygen Discharge (Final Oxygen Re-Evaluation): Oxygen Re-Evaluation - 10/26/17 1650      Program Oxygen Prescription   Program Oxygen Prescription  None      Home Oxygen   Home Oxygen Device  None    Sleep Oxygen Prescription  CPAP has not been using    Liters per minute  0    Home  Exercise Oxygen Prescription  None    Home at Rest Exercise Oxygen Prescription  None    Compliance with Home Oxygen Use  No does not wear CPAP      Goals/Expected Outcomes   Short Term Goals  To learn and demonstrate proper use of respiratory medications;To learn and understand importance of maintaining oxygen saturations>88%;To learn and demonstrate proper pursed lip breathing techniques or other breathing techniques.;To learn and understand importance of monitoring SPO2 with pulse oximeter and demonstrate accurate use of the pulse oximeter.    Long  Term Goals  Verbalizes importance of monitoring SPO2 with pulse oximeter and return demonstration;Exhibits proper breathing techniques, such as pursed lip breathing or other method taught  during program session;Demonstrates proper use of MDI's;Compliance with respiratory medication;Maintenance of O2 saturations>88%    Comments  Evynn has been taking her respiratory medications as prescribed. Her shortness of breath has been a little better since the start of [facility].  She has yet to perfect PLB.     Goals/Expected Outcomes  Short. Work on PLB. Long: Be independent with PLB       Initial Exercise Prescription: Initial Exercise Prescription - 07/20/17 1600      Date of Initial Exercise RX and Referring Provider   Date  07/20/17    Referring Provider  [facility]      Treadmill   MPH  1.8    Grade  0    Minutes  15    METs  2.38      NuStep   Level  1    SPM  80    Minutes  15    METs  2      REL-XR   Level  1    Speed  50    Minutes  15    METs  2      Prescription Details   Frequency (times per week)  3    Duration  Progress to 45 minutes of aerobic exercise without signs/symptoms of physical distress      Intensity   THRR 40-80% of Max Heartrate  108-141    Ratings of Perceived Exertion  11-13    Perceived Dyspnea  0-4      Progression   Progression  Continue to progress workloads to maintain intensity without  signs/symptoms of physical distress.      Resistance Training   Training Prescription  Yes    Weight  2 lbs    Reps  10-15       Perform Capillary Blood Glucose checks as needed.  Exercise Prescription Changes: Exercise Prescription Changes    Row Name 08/26/17 1100 10/07/17 1200 10/09/17 1100 10/21/17 1500 11/04/17 1100     Response to Exercise   Blood Pressure (Admit)  126/70  164/74  -  146/82  128/78   Blood Pressure (Exercise)  148/92  156/74  -  -  -   Blood Pressure (Exit)  126/64  138/64  -  134/84  126/74   Heart Rate (Admit)  85 bpm  83 bpm  -  85 bpm  81 bpm   Heart Rate (Exercise)  148 bpm  138 bpm  -  118 bpm  128 bpm   Heart Rate (Exit)  86 bpm  89 bpm  -  81 bpm  87 bpm   Oxygen Saturation (Admit)  96 %  97 %  -  95 %  95 %   Oxygen Saturation (Exercise)  96 %  95 %  -  94 %  94 %   Oxygen Saturation (Exit)  95 %  93 %  -  95 %  96 %   Rating of Perceived Exertion (Exercise)  12  11  -  12  13   Perceived Dyspnea (Exercise)  1  1  -  1  1   Symptoms  none  none  -  none  none   Duration  Progress to 45 minutes of aerobic exercise without signs/symptoms of physical distress  Continue with 45 min of aerobic exercise without signs/symptoms of physical distress.  -  Continue with 45 min of aerobic exercise without signs/symptoms of physical distress.  Continue with 45 min of  aerobic exercise without signs/symptoms of physical distress.   Intensity  THRR unchanged  THRR unchanged  -  THRR unchanged  THRR unchanged     Progression   Progression  Continue to progress workloads to maintain intensity without signs/symptoms of physical distress.  Continue to progress workloads to maintain intensity without signs/symptoms of physical distress.  -  Continue to progress workloads to maintain intensity without signs/symptoms of physical distress.  Continue to progress workloads to maintain intensity without signs/symptoms of physical distress.   Average METs  2.25  2.6  -  2.4  3.35      Resistance Training   Training Prescription  Yes  Yes  -  Yes  Yes   Weight  2 lb  2 lb  -  2 lb  2 lb   Reps  10-15  10-15  -  10-15  10-15     Interval Training   Interval Training  -  No  -  No  No     Treadmill   MPH  2  2.4  -  -  2.6   Grade  0  1  -  -  2   Minutes  15  15  -  -  15   METs  2.52  3.17  -  -  3.71     NuStep   Level  -  3  -  3  3   SPM  -  96  -  92  81   Minutes  -  15  -  15  15   METs  -  3  -  3.3  3     REL-XR   Level  1  1  -  2  -   Speed  53  60  -  48  -   Minutes  15  15  -  15  -   METs  2.1  1.6  -  1.5  -     Home Exercise Plan   Plans to continue exercise at  -  -  Home (comment) walking  Home (comment) walking  Home (comment) walking   Frequency  -  -  Add 1 additional day to program exercise sessions.  Add 1 additional day to program exercise sessions.  Add 1 additional day to program exercise sessions.   Initial Home Exercises Provided  -  -  10/09/17  10/09/17  10/09/17   Row Name 11/18/17 1200             Response to Exercise   Blood Pressure (Admit)  122/60       Blood Pressure (Exit)  122/70       Heart Rate (Admit)  94 bpm       Heart Rate (Exercise)  101 bpm       Heart Rate (Exit)  67 bpm       Oxygen Saturation (Admit)  97 %       Oxygen Saturation (Exercise)  96 %       Oxygen Saturation (Exit)  96 %       Rating of Perceived Exertion (Exercise)  13       Perceived Dyspnea (Exercise)  1       Symptoms  none       Duration  Continue with 45 min of aerobic exercise without signs/symptoms of physical distress.       Intensity  THRR unchanged  Progression   Progression  Continue to progress workloads to maintain intensity without signs/symptoms of physical distress.       Average METs  2.7         Resistance Training   Training Prescription  Yes       Weight  3 lb       Reps  10-15         Interval Training   Interval Training  No         NuStep   Level  4       SPM  73       Minutes  15        METs  2.8         REL-XR   Level  4       Speed  42       Minutes  15       METs  2.6         Home Exercise Plan   Plans to continue exercise at  Home (comment) walking       Frequency  Add 1 additional day to program exercise sessions.       Initial Home Exercises Provided  10/09/17          Exercise Comments: Exercise Comments    Row Name 08/17/17 1130           Exercise Comments  First full day of exercise!  Patient was oriented to gym and equipment including functions, settings, policies, and procedures.  Patient's individual exercise prescription and treatment plan were reviewed.  All starting workloads were established based on the results of the 6 minute walk test done at initial orientation visit.  The plan for exercise progression was also introduced and progression will be customized based on patient's performance and goals          Exercise Goals and Review: Exercise Goals    Row Name 07/20/17 1615             Exercise Goals   Increase Physical Activity  Yes       Intervention  Provide advice, education, support and counseling about physical activity/exercise needs.;Develop an individualized exercise prescription for aerobic and resistive training based on initial evaluation findings, risk stratification, comorbidities and participant's personal goals.       Expected Outcomes  Achievement of increased cardiorespiratory fitness and enhanced flexibility, muscular endurance and strength shown through measurements of functional capacity and personal statement of participant.       Increase Strength and Stamina  Yes       Intervention  Provide advice, education, support and counseling about physical activity/exercise needs.;Develop an individualized exercise prescription for aerobic and resistive training based on initial evaluation findings, risk stratification, comorbidities and participant's personal goals.       Expected Outcomes  Achievement of increased  cardiorespiratory fitness and enhanced flexibility, muscular endurance and strength shown through measurements of functional capacity and personal statement of participant.          Exercise Goals Re-Evaluation : Exercise Goals Re-Evaluation    [place] Name 08/17/17 1141 08/26/17 1202 09/09/17 1208 10/07/17 1254 10/09/17 1140     Exercise Goal Re-Evaluation   Exercise Goals Review  Increase Physical Activity;Increase Strenth and Stamina  Increase Physical Activity  -  Increase Physical Activity;Increase Strength and Stamina;Able to understand and use rate of perceived exertion (RPE) scale;Able to understand and use Dyspnea scale  Increase Physical Activity;Increase Strength and Stamina;Knowledge and understanding  of Target Heart Rate Range (THRR);Able to understand and use rate of perceived exertion (RPE) scale;Understanding of Exercise Prescription   Comments  Margarete came to her first day of exercise today. She had been out for several week since her initail medical review.   [place] attended her first two sessions.  She tolerated exercise well.    Kelcy has not attended since 8/22.  Sandy is tolerating exercise well.  She has increased levels on all machines.  Reviewed home exercise with pt today.  Pt plans to walk an extra day at home for exercise.  Reviewed THR, pulse, RPE, sign and symptoms, NTG use, and when to call 911 or MD.  Also discussed weather considerations and indoor options.  Pt voiced understanding   Expected Outcomes  Short: Tayanna will be consistant with her attendance to pulmonary rehab. Long: With consistant attendance and exercise Audra will gain strength and stamina to progress with her exercise in rehab and other ADL's.   Short - Ralyn will attend LW regularly.  Long - Aury will improve overall fitness.  -  Short - Carmin will continue to attend regularly.  Long - Leahna will continue to improve overall fitness level.  Short: add 1 extra day of walking to home exercise. Long:  Maintain a home exercise routine and add additional days of walking   Row Name 10/21/17 1523 11/04/17 1110 11/18/17 1217         Exercise Goal Re-Evaluation   Exercise Goals Review  Increase Physical Activity;Increase Strength and Stamina  Increase Physical Activity;Increase Strength and Stamina;Able to understand and use rate of perceived exertion (RPE) scale;Able to understand and use Dyspnea scale  Increase Physical Activity;Increase Strength and Stamina;Able to understand and use rate of perceived exertion (RPE) scale;Able to understand and use Dyspnea scale     Comments  Layza is tolerating exercise well and has increased resistance on all equipment.    Madison is progressing well with exercise.  She has increased overall MET level.  Legacy is progressing well with exercise.  She has seen some weight loss.  She is slowly increasing her levels on machines.     Expected Outcomes  Short - Barri will continue to attend consistently.  Long - Denea will see continued improvements in MET level.  Short - Aranza will continue to progress workloads. Long - Lashika willl maintain fitness improvements.  Short - Richell will continue to attend regularly.  Long - Yury will continue to improve overall fitness levels.        Discharge Exercise Prescription (Final Exercise Prescription Changes): Exercise Prescription Changes - 11/18/17 1200      Response to Exercise   Blood Pressure (Admit)  122/60    Blood Pressure (Exit)  122/70    Heart Rate (Admit)  94 bpm    Heart Rate (Exercise)  101 bpm    Heart Rate (Exit)  67 bpm    Oxygen Saturation (Admit)  97 %    Oxygen Saturation (Exercise)  96 %    Oxygen Saturation (Exit)  96 %    Rating of Perceived Exertion (Exercise)  13    Perceived Dyspnea (Exercise)  1    Symptoms  none    Duration  Continue with 45 min of aerobic exercise without signs/symptoms of physical distress.    Intensity  THRR unchanged      Progression   Progression  Continue to  progress workloads to maintain intensity without signs/symptoms of physical distress.    Average METs  2.7      Resistance Training   Training Prescription  Yes    Weight  3 lb    Reps  10-15      Interval Training   Interval Training  No      NuStep   Level  4    SPM  73    Minutes  15    METs  2.8      REL-XR   Level  4    Speed  42    Minutes  15    METs  2.6      Home Exercise Plan   Plans to continue exercise at  Home (comment) walking    Frequency  Add 1 additional day to program exercise sessions.    Initial Home Exercises Provided  10/09/17       Nutrition:  Target Goals: Understanding of nutrition guidelines, daily intake of sodium <1550m, cholesterol <2029m calories 30% from fat and 7% or less from saturated fats, daily to have 5 or more servings of fruits and vegetables.  Biometrics: Pre Biometrics - 07/20/17 1615      Pre Biometrics   Height  5' 2.2" (1.58 m)    Weight  211 lb 3.2 oz (95.8 kg)    Waist Circumference  41 inches    Hip Circumference  43.5 inches    Waist to Hip Ratio  0.94 %    BMI (Calculated)  38.5        Nutrition Therapy Plan and Nutrition Goals: Nutrition Therapy & Goals - 10/29/17 1755      Nutrition Therapy   Diet  basic healthy diet for weight loss, low sodium    Drug/Food Interactions  Statins/Certain Fruits    Protein (specify units)  8oz    Fruits and Vegetables  5 servings/day    Sodium  1500 grams      Personal Nutrition Goals   Personal Goal #2  Continue with current healthy food choices and regular exercise    Personal Goal #3  Use food lists and menus given to help keep nutritionally balanced meals and include variety of choices    Personal Goal #4  Keep working to limit sweets by eating small portions and limit how often; try tracking intake of sweets to stay aware of how much and how often you eat them.     Comments  Ms. Beyl has made significant positive changes on her own, and reports weight loss of 15lbs  since starting [facility] classes. She is motivated to continue.        Nutrition Discharge: Rate Your Plate Scores:   Nutrition Goals Re-Evaluation: Nutrition Goals Re-Evaluation    Row Name 08/17/17 1139 10/09/17 1415 10/26/17 1658         Goals   Current Weight  206 lb (93.4 kg)  201 lb (91.2 kg)  196 lb (88.9 kg)     Nutrition Goal  Has a scheduled appointment to meet with the dietician on 09/10/17 to set more specific nutrition goal.   Lose weight and adhere to a healthier diet.  Adhere to a diet plan and continue to lose weight.     Comment  -  BrJazsmintates she has been eating more vegetables and salad. She has been eating less red meat. She met with the dietician and has a good outlook for what to eat.  BrMarlisas been trying to lose weight. So far she has done a great job. She states she has been  eating smaller portions which has helped her alot. Her appointment for the Nutritionist is November 1st.     Expected Outcome  -  Short: lose weight by eating healthy. Long: Adhere to a diet plan to maintain weight loss.  Short:meet with the nutritionist. Long: Maintain a healthy diet.        Nutrition Goals Discharge (Final Nutrition Goals Re-Evaluation): Nutrition Goals Re-Evaluation - 10/26/17 1658      Goals   Current Weight  196 lb (88.9 kg)    Nutrition Goal  Adhere to a diet plan and continue to lose weight.    Comment  Zeah has been trying to lose weight. So far she has done a great job. She states she has been eating smaller portions which has helped her alot. Her appointment for the Nutritionist is November 1st.    Expected Outcome  Short:meet with the nutritionist. Long: Maintain a healthy diet.       Psychosocial: Target Goals: Acknowledge presence or absence of significant depression and/or stress, maximize coping skills, provide positive support system. Participant is able to verbalize types and ability to use techniques and skills needed for reducing stress and  depression.   Initial Review & Psychosocial Screening: Initial Psych Review & Screening - 07/20/17 1700      Initial Review   Current issues with  None Identified      Family Dynamics   Good Support System?  Yes      Barriers   Psychosocial barriers to participate in program  The patient should benefit from training in stress management and relaxation.      Screening Interventions   Interventions  Program counselor consult       Quality of Life Scores: Quality of Life - 07/20/17 1620      Quality of Life Scores   Health/Function Pre  15.25 %    Socioeconomic Pre  19.36 %    Psych/Spiritual Pre  19.79 %    Family Pre  21.25 %    GLOBAL Pre  17.74 %       PHQ-9: Recent Review Flowsheet Data    Depression screen [facility] 2/9 11/18/2017 09/30/2017 07/20/2017   Decreased Interest _0 Down, Depressed, Hopeless 1 0 0   PHQ - 2 Score _1 Altered sleeping _2 Tired, decreased energy _3 Change in appetite _4 Feeling bad or failure about yourself  0 1 1   Trouble concentrating _5 Moving slowly or fidgety/restless 0 0 0   Suicidal thoughts 0 0 0   PHQ-9 Score _6 Difficult doing work/chores Somewhat difficult Somewhat difficult Somewhat difficult     Interpretation of Total Score  Total Score Depression Severity:  1-4 = Minimal depression, 5-9 = Mild depression, 10-14 = Moderate depression, 15-19 = Moderately severe depression, 20-27 = Severe depression   Psychosocial Evaluation and Intervention: Psychosocial Evaluation - 08/17/17 1028      Psychosocial Evaluation & Interventions   Interventions  Encouraged to exercise with the program and follow exercise prescription;Relaxation education;Stress management education    Comments  Counselor met with Ms. Sunday Corn today Hassan Rowan) for initial psychosocial evaluation.  She is a 67 year old who has asthma and many other health issues, including RA; Fibromyalgia; HBP and High Cholesterol; obesity and she had  her thyroid removed approximately 10 years ago.  An has a strong support  system with a spouse of 22 years and she is actively involved in her local church.  Oniya has chronic sleep problems with maybe 4 nights of 4-6 hours per week on average.  She denies a history of depression or anxiety or any current symptoms and states she is typically in a positive mood most of the time.  Dameshia has multiple stressors with her health; caring for her mother with dementia and finances.  She has goals to lose weight and increase her stamina and strength.  Grizelda will be followed by staff throughout the course of this program.      Expected Outcomes  Roschelle will benefit from consistent exercise to achieve her stated goals.  She will be seeing the dietician who will help her address her weight loss goals.  Dannetta will also benefit from the educational and psychoeducational components of this program to help her manage her medical issues and cope better with her stressors.      Continue Psychosocial Services   Follow up required by staff       Psychosocial Re-Evaluation: Psychosocial Re-Evaluation    [place] Name 09/30/17 1129 10/26/17 1702 11/18/17 1052         Psychosocial Re-Evaluation   Current issues with  Current Stress Concerns;Current Sleep Concerns  Current Stress Concerns;Current Sleep Concerns  Current Depression;Current Sleep Concerns;Current Stress Concerns     Comments  Counselor follow up with Hassan Rowan today reporting her sleep has improved slightly since she came into the program. 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  Mariaguadalupe has been out of class for several weeks due to her mother's health; which resulted in her mother going into a nursing home during that time.  Conni reports taking that opportunity to focus on her personal self-care and attended to much needed medical appointments.  Also she has been  able to have additional help for her mother in place.  This reduces her stress.  Calleigh reports exercising also helps reduce her stress and she is happy to be back in class.  She received a phone call from a Dr.'s office while meeting with counselor and became a little stressed about that interruption.  Counselor encouraged Saarah to turn her phone off during her self-care time - like this class.  Her spouse knows how to care for Larenda's mother and she agreed this would help her focus more and enjoy the class more.  Annaleigh states finances continue to be a stressor but her spouse may be getting disability soon and that will be helpful.  Counselor commended Elsie for her progress made and commitment to exercise and positive self-care.   Loda's current stressor is taking care of her mom with dementia. She enjoys class but sometimes cant focus like she would like to.  Counselor follow up with Johnathon today reporting has a "stress headache" today.  Processed the stress in her life and states she continues to be up and down all night so her sleep continues to be interrupted.  She also continues to be the primary caregiver for her mother and that is stressful as well.  Deepa reports her energy level has improved and she is more comfortable exercising and loves the self-care/social aspects of this program.  She states her "attitude has improved" since coming more consistently.  She also has lost about 13 pounds which was one of her goals!  She mentioned having a stress  fracture in her left foot and may need a boot for that.  Doesn't know if this will impact her ability to complete this program currently.  Counselor commended Montserrat for all her hard work and progress made.      Expected Outcomes  Dionna will continue to exercise consistently.  She will begin to set better limits on her time in class by turning her phone off to decrease her stress and improve her self-care.  She will need to meet with the dietician since  she missed that appointment while out with her mother.    Short: attend [facility] regularly to decrease stress. Long: Maintain an exercise to keep stress to a minimum.  Gillie will continue to exercise to help with stress and improved health.  She was encouraged to address her sleep issues with her Dr. as it has been chronic - but she reports caring for her mother impacts her ability to rest well overall.  Makylie will continue to practice stress management techniques shared in the educational component of this class.       Interventions  -  Encouraged to attend [facility] for the exercise  Stress management education;Relaxation education     Continue Psychosocial Services   Follow up required by staff  Follow up required by staff  Follow up required by staff       Initial Review   Source of Stress Concerns  Family;Financial  -  -        Psychosocial Discharge (Final Psychosocial Re-Evaluation): Psychosocial Re-Evaluation - 11/18/17 1052      Psychosocial Re-Evaluation   Current issues with  Current Depression;Current Sleep Concerns;Current Stress Concerns    Comments  Counselor follow up with Vani today reporting has a "stress headache" today.  Processed the stress in her life and states she continues to be up and down all night so her sleep continues to be interrupted.  She also continues to be the primary caregiver for her mother and that is stressful as well.  Deaven reports her energy level has improved and she is more comfortable exercising and loves the self-care/social aspects of this program.  She states her "attitude has improved" since coming more consistently.  She also has lost about 13 pounds which was one of her goals!  She mentioned having a stress fracture in her left foot and may need a boot for that.  Doesn't know if this will impact her ability to complete this program currently.  Counselor commended Bayli for all her hard work and progress made.     Expected  Outcomes  Ashlinn will continue to exercise to help with stress and improved health.  She was encouraged to address her sleep issues with her Dr. as it has been chronic - but she reports caring for her mother impacts her ability to rest well overall.  Leenah will continue to practice stress management techniques shared in the educational component of this class.      Interventions  Stress management education;Relaxation education    Continue Psychosocial Services   Follow up required by staff       Education: Education Goals: Education classes will be provided on a weekly basis, covering required topics. Participant will state understanding/return demonstration of topics presented.  Learning Barriers/Preferences: Learning Barriers/Preferences - 07/20/17 1656      Learning Barriers/Preferences   Learning Barriers  None    Learning Preferences  None       Education Topics: Initial Evaluation Education: - Verbal, written  and demonstration of respiratory meds, RPE/PD scales, oximetry and breathing techniques. Instruction on use of nebulizers and MDIs: cleaning and proper use, rinsing mouth with steroid doses and importance of monitoring MDI activations.   Pulmonary Rehab from 11/18/2017 in [facility] Cardiac and Pulmonary Rehab  Date  07/20/17  Educator  [facility]  Instruction Review Code (retired)  2- meets goals/outcomes  Instruction Review Code  1- IT trainer Nutrition Guidelines/Fats and Fiber: -Group instruction provided by verbal, written material, models and posters to present the general guidelines for heart healthy nutrition. Gives an explanation and review of dietary fats and fiber.   Pulmonary Rehab from 11/18/2017 in [facility] Cardiac and Pulmonary Rehab  Date  08/17/17  Educator  CR      Controlling Sodium/Reading Food Labels: -Group verbal and written material supporting the discussion of sodium use in heart healthy nutrition. Review and explanation with  models, verbal and written materials for utilization of the food label.   Pulmonary Rehab from 11/18/2017 in [facility] Cardiac and Pulmonary Rehab  Date  10/19/17  Educator  CR  Instruction Review Code  5- Refused Teaching      Exercise Physiology & Risk Factors: - Group verbal and written instruction with models to review the exercise physiology of the cardiovascular system and associated critical values. Details cardiovascular disease risk factors and the goals associated with each risk factor.   Pulmonary Rehab from 11/18/2017 in [facility] Cardiac and Pulmonary Rehab  Date  10/02/17  Educator  Nada Maclachlan, EP  Instruction Review Code  1- Verbalizes Understanding      Aerobic Exercise & Resistance Training: - Gives group verbal and written discussion on the health impact of inactivity. On the components of aerobic and resistive training programs and the benefits of this training and how to safely progress through these programs.   Pulmonary Rehab from 11/18/2017 in [facility] Cardiac and Pulmonary Rehab  Date  10/30/17  Educator  [facility]  Instruction Review Code  1- Verbalizes Understanding      Flexibility, Balance, General Exercise Guidelines: - Provides group verbal and written instruction on the benefits of flexibility and balance training programs. Provides general exercise guidelines with specific guidelines to those with heart or lung disease. Demonstration and skill practice provided.   Pulmonary Rehab from 11/18/2017 in [facility] Cardiac and Pulmonary Rehab  Date  11/18/17  Educator  [facility]  Instruction Review Code  1- Verbalizes Understanding      Stress Management: - Provides group verbal and written instruction about the health risks of elevated stress, cause of high stress, and healthy ways to reduce stress.   Depression: - Provides group verbal and written instruction on the correlation between heart/lung disease and depressed mood, treatment options, and the stigmas associated with  seeking treatment.   Pulmonary Rehab from 11/18/2017 in [facility] Cardiac and Pulmonary Rehab  Date  08/19/17  Educator  [facility]  Instruction Review Code (retired)  2- meets goals/outcomes  Instruction Review Code  1- Science writer Understanding      Exercise & Equipment Safety: - Individual verbal instruction and demonstration of equipment use and safety with use of the equipment.   Pulmonary Rehab from 11/18/2017 in [facility] Cardiac and Pulmonary Rehab  Date  08/17/17  Educator  [facility]  Instruction Review Code  1- Verbalizes Understanding      Infection Prevention: - Provides verbal and written material to individual with discussion of infection control including proper hand washing and proper equipment cleaning during exercise session.  Pulmonary Rehab from 11/18/2017 in [facility] Cardiac and Pulmonary Rehab  Date  07/20/17  Educator  [facility]  Instruction Review Code  1- Verbalizes Understanding      Falls Prevention: - Provides verbal and written material to individual with discussion of falls prevention and safety.   Pulmonary Rehab from 11/18/2017 in [facility] Cardiac and Pulmonary Rehab  Date  07/20/17  Educator  [facility]  Instruction Review Code (retired)  2- meets goals/outcomes  Instruction Review Code  1- Science writer Understanding      Diabetes: - Individual verbal and written instruction to review signs/symptoms of diabetes, desired ranges of glucose level fasting, after meals and with exercise. Advice that pre and post exercise glucose checks will be done for 3 sessions at entry of program.   Pulmonary Rehab from 11/18/2017 in [facility] Cardiac and Pulmonary Rehab  Date  07/20/17  Educator  [facility]  Instruction Review Code  1- Verbalizes Understanding      Chronic Lung Diseases: - Group verbal and written instruction to review new updates, new respiratory medications, new advancements in procedures and treatments. Provide informative websites and "800" numbers of self-education.   Pulmonary Rehab from  11/18/2017 in [facility] Cardiac and Pulmonary Rehab  Date  09/30/17  Educator  [facility]  Instruction Review Code  1- Verbalizes Understanding      Lung Procedures: - Group verbal and written instruction to describe testing methods done to diagnose lung disease. Review the outcome of test results. Describe the treatment choices: Pulmonary Function Tests, ABGs and oximetry.   Energy Conservation: - Provide group verbal and written instruction for methods to conserve energy, plan and organize activities. Instruct on pacing techniques, use of adaptive equipment and posture/positioning to relieve shortness of breath.   Triggers: - Group verbal and written instruction to review types of environmental controls: home humidity, furnaces, filters, dust mite/pet prevention, HEPA vacuums. To discuss weather changes, air quality and the benefits of nasal washing.   Exacerbations: - Group verbal and written instruction to provide: warning signs, infection symptoms, calling MD promptly, preventive modes, and value of vaccinations. Review: effective airway clearance, coughing and/or vibration techniques. Create an Sports administrator.   Oxygen: - Individual and group verbal and written instruction on oxygen therapy. Includes supplement oxygen, available portable oxygen systems, continuous and intermittent flow rates, oxygen safety, concentrators, and Medicare reimbursement for oxygen.   Respiratory Medications: - Group verbal and written instruction to review medications for lung disease. Drug class, frequency, complications, importance of spacers, rinsing mouth after steroid MDI's, and proper cleaning methods for nebulizers.   AED/CPR: - Group verbal and written instruction with the use of models to demonstrate the basic use of the AED with the basic ABC's of resuscitation.   Breathing Retraining: - Provides individuals verbal and written instruction on purpose, frequency, and proper technique of diaphragmatic  breathing and pursed-lipped breathing. Applies individual practice skills.   Pulmonary Rehab from 11/18/2017 in [facility] Cardiac and Pulmonary Rehab  Date  08/17/17  Educator  [facility]  Instruction Review Code  1- Actuary and Physiology of the Lungs: - Group verbal and written instruction with the use of models to provide basic lung anatomy and physiology related to function, structure and complications of lung disease.   Anatomy & Physiology of the Heart: - Group verbal and written instruction and models provide basic cardiac anatomy and physiology, with the coronary electrical and arterial systems. Review of: AMI, Angina, Valve disease, Heart Failure, Cardiac Arrhythmia, Pacemakers, and the  ICD.   Pulmonary Rehab from 11/18/2017 in [facility] Cardiac and Pulmonary Rehab  Date  11/13/17  Educator  [facility]  Instruction Review Code  1- Verbalizes Understanding      Heart Failure: - Group verbal and written instruction on the basics of heart failure: signs/symptoms, treatments, explanation of ejection fraction, enlarged heart and cardiomyopathy.   Sleep Apnea: - Individual verbal and written instruction to review Obstructive Sleep Apnea. Review of risk factors, methods for diagnosing and types of masks and machines for OSA.   Anxiety: - Provides group, verbal and written instruction on the correlation between heart/lung disease and anxiety, treatment options, and management of anxiety.   Relaxation: - Provides group, verbal and written instruction about the benefits of relaxation for patients with heart/lung disease. Also provides patients with examples of relaxation techniques.   Cardiac Medications: - Group verbal and written instruction to review commonly prescribed medications for heart disease. Reviews the medication, class of the drug, and side effects.   Know Your Numbers: -Group verbal and written instruction about important numbers in your health.  Review of  Cholesterol, Blood Pressure, Diabetes, and BMI and the role they play in your overall health.   Pulmonary Rehab from 11/18/2017 in [facility] Cardiac and Pulmonary Rehab  Date  10/09/17  Educator  [facility]  Instruction Review Code  1- Verbalizes Understanding      Other: -Provides group and verbal instruction on various topics (see comments)    Knowledge Questionnaire Score: Knowledge Questionnaire Score - 07/20/17 1618      Knowledge Questionnaire Score   Pre Score  8/10 reviewed with pt today        Core Components/Risk Factors/Patient Goals at Admission: Personal Goals and Risk Factors at Admission - 07/20/17 1653      Core Components/Risk Factors/Patient Goals on Admission    Weight Management  Yes    Intervention  Weight Management: Develop a combined nutrition and exercise program designed to reach desired caloric intake, while maintaining appropriate intake of nutrient and fiber, sodium and fats, and appropriate energy expenditure required for the weight goal.;Weight Management: Provide education and appropriate resources to help participant work on and attain dietary goals.;Weight Management/Obesity: Establish reasonable short term and long term weight goals.    Admit Weight  215 lb (97.5 kg)    Goal Weight: Short Term  210 lb (95.3 kg)    Goal Weight: Long Term  150 lb (68 kg)    Expected Outcomes  Understanding of distribution of calorie intake throughout the day with the consumption of 4-5 meals/snacks;Understanding recommendations for meals to include 15-35% energy as protein, 25-35% energy from fat, 35-60% energy from carbohydrates, less than 239m of dietary cholesterol, 20-35 gm of total fiber daily;Weight Loss: Understanding of general recommendations for a balanced deficit meal plan, which promotes 1-2 lb weight loss per week and includes a negative energy balance of [id number] kcal/d;Weight Maintenance: Understanding of the daily nutrition guidelines, which includes 25-35% calories  from fat, 7% or less cal from saturated fats, less than 2028mcholesterol, less than 1.5gm of sodium, & 5 or more servings of fruits and vegetables daily;Short Term: Continue to assess and modify interventions until short term weight is achieved    Diabetes  Yes    Intervention  Provide education about signs/symptoms and action to take for hypo/hyperglycemia.;Provide education about proper nutrition, including hydration, and aerobic/resistive exercise prescription along with prescribed medications to achieve blood glucose in normal ranges: Fasting glucose 65-99 mg/dL    Expected Outcomes  Short Term: Participant verbalizes understanding of the signs/symptoms and immediate care of hyper/hypoglycemia, proper foot care and importance of medication, aerobic/resistive exercise and nutrition plan for blood glucose control.;Long Term: Attainment of HbA1C < 7%.    Lipids  Yes    Intervention  Provide education and support for participant on nutrition & aerobic/resistive exercise along with prescribed medications to achieve LDL '70mg'$ , HDL >'40mg'$ .    Expected Outcomes  Short Term: Participant states understanding of desired cholesterol values and is compliant with medications prescribed. Participant is following exercise prescription and nutrition guidelines.;Long Term: Cholesterol controlled with medications as prescribed, with individualized exercise RX and with personalized nutrition plan. Value goals: LDL < '70mg'$ , HDL > 40 mg.       Core Components/Risk Factors/Patient Goals Review:  Goals and Risk Factor Review    Row Name 08/17/17 1136 10/16/17 1123 10/26/17 1655         Core Components/Risk Factors/Patient Goals Review   Personal Goals Review  Weight Management/Obesity  Weight Management/Obesity;Improve shortness of breath with ADL's;Diabetes;Stress  Weight Management/Obesity;Stress;Diabetes;Improve shortness of breath with ADL's     Review  Weight loss was discussed with that patient. We discussed how  physical activity and exercise along with dietary changes can aid in this weight loss goal. She was encouraged to make an appointment with the dietician, which she did.   Catelynn has been dealing with her mother and her dementia. She has lost a little weight since the start of the program. Some days are better than others with her breathing. She states when it gets cold she can feel it in her lungs. She also does not do well with hot weather.  Keyerra has been stressed taking care of her mother with dementia. She is down ten pounds since the start of the program.      Expected Outcomes  Short: Kinley has a short term goal of losing 1-2 lbs per week and aiming towards 196 lbs( 10 lbs loss). Dilpreet has will meet with the dietician on 09/10/17 and will continue to consistantly come to  pulmonary rehab for exercise.  Long: Long term weight goal is 150 lbs.   Short: Attend class regularyly to improve ADL's. Long: Maintain routine exercise to improve ADL's independently.  Short: Attemd [facility] to reduce stress. Long: maintain a stress free environment.        Core Components/Risk Factors/Patient Goals at Discharge (Final Review):  Goals and Risk Factor Review - 10/26/17 1655      Core Components/Risk Factors/Patient Goals Review   Personal Goals Review  Weight Management/Obesity;Stress;Diabetes;Improve shortness of breath with ADL's    Review  Dyan has been stressed taking care of her mother with dementia. She is down ten pounds since the start of the program.     Expected Outcomes  Short: Attemd [facility] to reduce stress. Long: maintain a stress free environment.       ITP Comments: ITP Comments    Row Name 07/20/17 1623 07/27/17 0845 07/28/17 1610 08/17/17 1038 08/24/17 0811   ITP Comments  Med eval completed.  Documentation for diagnosis can be found in [place] notes that will be sent to medical records.  30 day review completed ITP sent to Dr. Ramonita Lab for Dr. Emily Filbert Director of [place].  Continue with ITP unless changes are made by physician. Patient has not yet had first day of exercise   Ms. Canupp called to say she cannot start until Monday August 20th due to her mom needing a Actuary while  her husband is away.  Patient had her first day of exercise. She felt a little short of breath post exercise and took her albuterol MDI. Patient recovered and continued to exercise.  30 day review completed. ITP sent to Dr. Emily Filbert Director of [place]. Continue with ITP unless changes are made by physician.     Row Name 09/09/17 1207 09/21/17 0811 09/23/17 1148 09/23/17 1411 10/19/17 0804   ITP Comments  Zeriyah has not attended since 8/22.    30 day review completed. ITP sent to Dr. Emily Filbert Director of [place]. Continue with ITP unless changes are made by physician.    Jonel has not attended since 08/19/17.  Called to see when and If Gabriellah would be able to resume [facility]. Message left.  30 day review completed. ITP sent to Dr. Emily Filbert Director of [place]. Continue with ITP unless changes are made by physician.     [place] Name 11/16/17 [phone] 11/27/17 0922 12/11/17 0924 12/14/17 0807 01/11/18 0804   ITP Comments  30 day review completed. ITP sent to Dr. Emily Filbert Director of [place]. Continue with ITP unless changes are made by physician.    Conchetta called to say that a stress fracture in her foot. Her mom has an appointment today and she will be fitted for a boot for Monday. She hopes to be back on Wednesday.  Yuridia called to say she would be back the first of the year. Her husband is going out of town and her foot has been hurting. She states she will be back in full force after the first of the year.  30 day review completed. ITP sent to Dr. Emily Filbert Director of [place]. Continue with ITP unless changes are made by physician.    Goals not done patient has not attended since 11/18/17. She states she will be back in 01/13/18   Row Name 01/11/18 0807           ITP Comments  30  day review completed. ITP sent to Dr. Emily Filbert Director of [place]. Continue with ITP unless changes are made by physician.          Comments: 30 day review

## 2018-01-20 ENCOUNTER — Encounter: Payer: Self-pay | Admitting: *Deleted

## 2018-01-20 ENCOUNTER — Telehealth: Payer: Self-pay | Admitting: *Deleted

## 2018-01-20 DIAGNOSIS — J4550 Severe persistent asthma, uncomplicated: Secondary | ICD-10-CM

## 2018-01-20 NOTE — Progress Notes (Signed)
Discharge Progress Report  Patient Details  Name: Sandra Fischer MRN: [id number] Date of Birth: 01/28/1951 Referring Provider:     Pulmonary Rehab from 07/20/2017 in [facility]  Referring Provider  [facility]       Number of Visits: 18/39  Reason for Discharge:  Early Exit:  Personal  Smoking History:  Social History   Tobacco Use  Smoking Status Never Smoker  Smokeless Tobacco Never Used    Diagnosis:  Severe persistent asthma without complication  ADL [facility]SD: Pulmonary Assessment Scores    Row Name 11/18/17 1025         ADL UCSD   ADL Phase  Mid     SOB Score total  44     Rest  1     Walk  3     Stairs  3     Bath  2     Dress  3     Shop  3        Initial Exercise Prescription:   Discharge Exercise Prescription (Final Exercise Prescription Changes): Exercise Prescription Changes - 11/18/17 1200      Response to Exercise   Blood Pressure (Admit)  122/60    Blood Pressure (Exit)  122/70    Heart Rate (Admit)  94 bpm    Heart Rate (Exercise)  101 bpm    Heart Rate (Exit)  67 bpm    Oxygen Saturation (Admit)  97 %    Oxygen Saturation (Exercise)  96 %    Oxygen Saturation (Exit)  96 %    Rating of Perceived Exertion (Exercise)  13    Perceived Dyspnea (Exercise)  1    Symptoms  none    Duration  Continue with 45 min of aerobic exercise without signs/symptoms of physical distress.    Intensity  THRR unchanged      Progression   Progression  Continue to progress workloads to maintain intensity without signs/symptoms of physical distress.    Average METs  2.7      Resistance Training   Training Prescription  Yes    Weight  3 lb    Reps  10-15      Interval Training   Interval Training  No      NuStep   Level  4    SPM  73    Minutes  15    METs  2.8      REL-XR   Level  4    Speed  42    Minutes  15    METs  2.6      Home Exercise Plan   Plans to continue exercise at  Home (comment) walking    Frequency  Add 1  additional day to program exercise sessions.    Initial Home Exercises Provided  10/09/17       Functional Capacity:   Psychological, QOL, Others - Outcomes: PHQ 2/9: Depression screen [facility] 2/9 11/18/2017 09/30/2017 07/20/2017  Decreased Interest 2 1 1   Down, Depressed, Hopeless 1 0 0  PHQ - 2 Score 3 1 1   Altered sleeping 1 1 2   Tired, decreased energy 3 2 3   Change in appetite 1 1 2   Feeling bad or failure about yourself  0 1 1  Trouble concentrating 1 1 3   Moving slowly or fidgety/restless 0 0 0  Suicidal thoughts 0 0 0  PHQ-9 Score 9 7 12   Difficult doing work/chores Somewhat difficult Somewhat difficult Somewhat difficult  Quality of Life:   Personal Goals: Goals established at orientation with interventions provided to work toward goal.    Personal Goals Discharge: Goals and Risk Factor Review    Row Name 08/17/17 1136 10/16/17 1123 10/26/17 1655         Core Components/Risk Factors/Patient Goals Review   Personal Goals Review  Weight Management/Obesity  Weight Management/Obesity;Improve shortness of breath with ADL's;Diabetes;Stress  Weight Management/Obesity;Stress;Diabetes;Improve shortness of breath with ADL's     Review  Weight loss was discussed with that patient. We discussed how physical activity and exercise along with dietary changes can aid in this weight loss goal. She was encouraged to make an appointment with the dietician, which she did.   Ka has been dealing with her mother and her dementia. She has lost a little weight since the start of the program. Some days are better than others with her breathing. She states when it gets cold she can feel it in her lungs. She also does not do well with hot weather.  Etter has been stressed taking care of her mother with dementia. She is down ten pounds since the start of the program.      Expected Outcomes  Short: Zareya has a short term goal of losing 1-2 lbs per week and aiming towards 196 lbs( 10 lbs loss).  Davon has will meet with the dietician on 09/10/17 and will continue to consistantly come to  pulmonary rehab for exercise.  Long: Long term weight goal is 150 lbs.   Short: Attend class regularyly to improve ADL's. Long: Maintain routine exercise to improve ADL's independently.  Short: Attemd [facility] to reduce stress. Long: maintain a stress free environment.        Exercise Goals and Review:   Nutrition & Weight - Outcomes:    Nutrition: Nutrition Therapy & Goals - 10/29/17 1755      Nutrition Therapy   Diet  basic healthy diet for weight loss, low sodium    Drug/Food Interactions  Statins/Certain Fruits    Protein (specify units)  8oz    Fruits and Vegetables  5 servings/day    Sodium  1500 grams      Personal Nutrition Goals   Personal Goal #2  Continue with current healthy food choices and regular exercise    Personal Goal #3  Use food lists and menus given to help keep nutritionally balanced meals and include variety of choices    Personal Goal #4  Keep working to limit sweets by eating small portions and limit how often; try tracking intake of sweets to stay aware of how much and how often you eat them.     Comments  Ms. Howser has made significant positive changes on her own, and reports weight loss of 15lbs since starting [facility] classes. She is motivated to continue.        Nutrition Discharge:   Education Questionnaire Score:   Goals reviewed with patient; copy given to patient.

## 2018-01-20 NOTE — Progress Notes (Signed)
Pulmonary Individual Treatment Plan  Patient Details  Name: Sandra Fischer MRN: [id number] Date of Birth: [birth year] Referring Provider:     Pulmonary Rehab from 07/20/2017 in [facility] Cardiac and Pulmonary Rehab  Referring Provider  [facility]      Initial Encounter Date:    Pulmonary Rehab from 07/20/2017 in [facility] Cardiac and Pulmonary Rehab  Date  07/20/17  Referring Provider  [facility]      Visit Diagnosis: Severe persistent asthma without complication  Patient's Home Medications on Admission:  Current Outpatient Medications:  .  Abatacept 125 MG/ML SOAJ, Take 750 mg by mouth., Disp: , Rfl:  .  albuterol (ACCUNEB) 1.25 MG/3ML nebulizer solution, Take 1 ampule by nebulization every 6 (six) hours as needed for wheezing., Disp: , Rfl:  .  albuterol (PROVENTIL HFA;VENTOLIN HFA) 108 (90 Base) MCG/ACT inhaler, Inhale into the lungs every 6 (six) hours as needed for wheezing or shortness of breath., Disp: , Rfl:  .  albuterol (PROVENTIL HFA;VENTOLIN HFA) 108 (90 Base) MCG/ACT inhaler, Inhale 2 puffs into the lungs every 6 (six) hours as needed for wheezing or shortness of breath., Disp: 1 Inhaler, Rfl: 0 .  aspirin EC 81 MG tablet, Take 81 mg by mouth daily., Disp: , Rfl:  .  azithromycin (ZITHROMAX) 500 MG tablet, Take 1 tablet (500 mg total) by mouth daily., Disp: 4 tablet, Rfl: 0 .  budesonide-formoterol (SYMBICORT) 160-4.5 MCG/ACT inhaler, Inhale 2 puffs into the lungs 2 (two) times daily., Disp: , Rfl:  .  cetirizine (ZYRTEC) 10 MG tablet, Take 10 mg by mouth daily., Disp: , Rfl:  .  desloratadine (CLARINEX) 5 MG tablet, Take 5 mg by mouth daily., Disp: , Rfl:  .  ferrous sulfate 325 (65 FE) MG tablet, Take 325 mg by mouth daily with breakfast., Disp: , Rfl:  .  fluconazole (DIFLUCAN) 150 MG tablet, Take 1 tablet (150 mg total) by mouth once. (Patient not taking: Reported on 08/23/2016), Disp: 2 tablet, Rfl: 1 .  guaiFENesin-dextromethorphan (ROBITUSSIN DM) 100-10 MG/5ML syrup, Take 5  mLs by mouth every 4 (four) hours as needed for cough., Disp: 118 mL, Rfl: 0 .  ipratropium (ATROVENT) 0.02 % nebulizer solution, Take 0.5 mg by nebulization 4 (four) times daily., Disp: , Rfl:  .  levothyroxine (SYNTHROID, LEVOTHROID) 88 MCG tablet, Take 88 mcg by mouth daily before breakfast., Disp: , Rfl:  .  lisinopril (PRINIVIL,ZESTRIL) 40 MG tablet, Take 40 mg by mouth daily., Disp: , Rfl:  .  montelukast (SINGULAIR) 10 MG tablet, Take 10 mg by mouth at bedtime., Disp: , Rfl:  .  omeprazole (PRILOSEC) 10 MG capsule, Take 20 mg by mouth daily., Disp: , Rfl:  .  pravastatin (PRAVACHOL) 40 MG tablet, Take 40 mg by mouth daily., Disp: , Rfl:  .  predniSONE (DELTASONE) 10 MG tablet, Take 1 tablet (10 mg total) by mouth daily., Disp: 42 tablet, Rfl: 0 .  Prenatal Vit-Fe Fumarate-FA (MULTIVITAMIN-PRENATAL) 27-0.8 MG TABS tablet, Take 1 tablet by mouth daily at 12 noon., Disp: , Rfl:  .  sulfamethoxazole-trimethoprim (BACTRIM DS,SEPTRA DS) 800-160 MG tablet, Take 1 tablet by mouth 2 (two) times daily., Disp: 14 tablet, Rfl: 0 .  traMADol (ULTRAM) 50 MG tablet, Take 100 mg by mouth as needed (for rheumatoid arthritis)., Disp: , Rfl:  .  verapamil (CALAN) 120 MG tablet, Take 240 mg by mouth 2 (two) times daily., Disp: , Rfl:   Past Medical History: Past Medical History:  Diagnosis Date  . Asthma   .  Hypertension   . RA (rheumatoid arthritis) (HCC)     Tobacco Use: Social History   Tobacco Use  Smoking Status Never Smoker  Smokeless Tobacco Never Used    Labs: Recent Review Flowsheet Data    There is no flowsheet data to display.       Pulmonary Assessment Scores: Pulmonary Assessment Scores    Row Name 11/18/17 1025         ADL UCSD   ADL Phase  Mid     SOB Score total  44     Rest  1     Walk  3     Stairs  3     Bath  2     Dress  3     Shop  3        Pulmonary Function Assessment:   Exercise Target Goals:    Exercise Program Goal: Individual exercise  prescription set using results from initial 6 min walk test and THRR while considering  patient's activity barriers and safety.    Exercise Prescription Goal: Initial exercise prescription builds to 30-45 minutes a day of aerobic activity, 2-3 days per week.  Home exercise guidelines will be given to patient during program as part of exercise prescription that the participant will acknowledge.  Activity Barriers & Risk Stratification:   6 Minute Walk:  Oxygen Initial Assessment:   Oxygen Re-Evaluation: Oxygen Re-Evaluation    Row Name 08/17/17 1131 10/07/17 1059 10/26/17 1650         Program Oxygen Prescription   Program Oxygen Prescription  -  None  None       Home Oxygen   Home Oxygen Device  -  None  None     Sleep Oxygen Prescription  -  None  CPAP has not been using     Liters per minute  -  -  0     Home Exercise Oxygen Prescription  -  None  None     Home at Rest Exercise Oxygen Prescription  -  None  None     Compliance with Home Oxygen Use  -  -  No does not wear CPAP       Goals/Expected Outcomes   Short Term Goals  To learn and understand importance of monitoring SPO2 with pulse oximeter and demonstrate accurate use of the pulse oximeter.;To learn and demonstrate proper purse lipped breathing techniques or other breathing techniques.;To Learn and understand importance of maintaining oxygen saturations>88%;To learn and demonstrate proper use of respiratory medications;To learn and exhibit compliance with exercise, home and travel O2 prescription  To learn and understand importance of maintaining oxygen saturations>88%;To learn and demonstrate proper use of respiratory medications;To learn and demonstrate proper pursed lip breathing techniques or other breathing techniques.;To learn and understand importance of monitoring SPO2 with pulse oximeter and demonstrate accurate use of the pulse oximeter.  To learn and demonstrate proper use of respiratory medications;To learn and  understand importance of maintaining oxygen saturations>88%;To learn and demonstrate proper pursed lip breathing techniques or other breathing techniques.;To learn and understand importance of monitoring SPO2 with pulse oximeter and demonstrate accurate use of the pulse oximeter.     Long  Term Goals  Exhibits proper breathing techniques, such as purse lipped breathing or other method taught during program session;Maintenance of O2 saturations>88%;Compliance with respiratory medication;Exhibits compliance with exercise, home and travel O2 prescription;Verbalizes importance of monitoring SPO2 with pulse oximeter and return demonstration  Verbalizes importance of monitoring SPO2 with pulse oximeter  and return demonstration;Exhibits proper breathing techniques, such as pursed lip breathing or other method taught during program session;Demonstrates proper use of MDI's;Compliance with respiratory medication;Maintenance of O2 saturations>88%  Verbalizes importance of monitoring SPO2 with pulse oximeter and return demonstration;Exhibits proper breathing techniques, such as pursed lip breathing or other method taught during program session;Demonstrates proper use of MDI's;Compliance with respiratory medication;Maintenance of O2 saturations>88%     Comments  On patient's first day of exercise pursed lip breathing was discussed and demonstrated as well as instruction on using the pulse oximeter and proper oxygen saturations during rest and exercise. Patient demonstrated understanding of these concepts.   Makita has to get a sleep study done. She states that her CPAP is old and has not worn it in awhile. She also needs to get a pulse oximeter to check her oxygen at home. Informed patient that she needs to keep her oxygen saturation above 88 percent. Mirai needs to work on PLB outside of class and in class. She keeps a spacer with her to take her albuterol when needed. Lanisa also uses her daily maintinence inhaler as  prescribed.  Sulma has been taking her respiratory medications as prescribed. Her shortness of breath has been a little better since the start of [facility].  She has yet to perfect PLB.      Goals/Expected Outcomes  Short: Patient will use PLB techniques in pulmonary rehab to help control SOB and maintain acceptable oygen saturations. Long: Patient will independenly use respiratory medications and breathing techniques to manage SOB and oxygen saturations during daily life activities. at home and during pulmonary rehab.   Short: Obtain a pulse oximter. Long: Independently check oxygen at home.  Short. Work on PLB. Long: Be independent with PLB        Oxygen Discharge (Final Oxygen Re-Evaluation): Oxygen Re-Evaluation - 10/26/17 1650      Program Oxygen Prescription   Program Oxygen Prescription  None      Home Oxygen   Home Oxygen Device  None    Sleep Oxygen Prescription  CPAP has not been using    Liters per minute  0    Home Exercise Oxygen Prescription  None    Home at Rest Exercise Oxygen Prescription  None    Compliance with Home Oxygen Use  No does not wear CPAP      Goals/Expected Outcomes   Short Term Goals  To learn and demonstrate proper use of respiratory medications;To learn and understand importance of maintaining oxygen saturations>88%;To learn and demonstrate proper pursed lip breathing techniques or other breathing techniques.;To learn and understand importance of monitoring SPO2 with pulse oximeter and demonstrate accurate use of the pulse oximeter.    Long  Term Goals  Verbalizes importance of monitoring SPO2 with pulse oximeter and return demonstration;Exhibits proper breathing techniques, such as pursed lip breathing or other method taught during program session;Demonstrates proper use of MDI's;Compliance with respiratory medication;Maintenance of O2 saturations>88%    Comments  Leon has been taking her respiratory medications as prescribed. Her shortness of breath has  been a little better since the start of [facility].  She has yet to perfect PLB.     Goals/Expected Outcomes  Short. Work on PLB. Long: Be independent with PLB       Initial Exercise Prescription:   Perform Capillary Blood Glucose checks as needed.  Exercise Prescription Changes: Exercise Prescription Changes    Row Name 08/26/17 1100 10/07/17 1200 10/09/17 1100 10/21/17 1500 11/04/17 1100     Response to  Exercise   Blood Pressure (Admit)  126/70  164/74  -  146/82  128/78   Blood Pressure (Exercise)  148/92  156/74  -  -  -   Blood Pressure (Exit)  126/64  138/64  -  134/84  126/74   Heart Rate (Admit)  85 bpm  83 bpm  -  85 bpm  81 bpm   Heart Rate (Exercise)  148 bpm  138 bpm  -  118 bpm  128 bpm   Heart Rate (Exit)  86 bpm  89 bpm  -  81 bpm  87 bpm   Oxygen Saturation (Admit)  96 %  97 %  -  95 %  95 %   Oxygen Saturation (Exercise)  96 %  95 %  -  94 %  94 %   Oxygen Saturation (Exit)  95 %  93 %  -  95 %  96 %   Rating of Perceived Exertion (Exercise)  12  11  -  12  13   Perceived Dyspnea (Exercise)  1  1  -  1  1   Symptoms  none  none  -  none  none   Duration  Progress to 45 minutes of aerobic exercise without signs/symptoms of physical distress  Continue with 45 min of aerobic exercise without signs/symptoms of physical distress.  -  Continue with 45 min of aerobic exercise without signs/symptoms of physical distress.  Continue with 45 min of aerobic exercise without signs/symptoms of physical distress.   Intensity  THRR unchanged  THRR unchanged  -  THRR unchanged  THRR unchanged     Progression   Progression  Continue to progress workloads to maintain intensity without signs/symptoms of physical distress.  Continue to progress workloads to maintain intensity without signs/symptoms of physical distress.  -  Continue to progress workloads to maintain intensity without signs/symptoms of physical distress.  Continue to progress workloads to maintain intensity without  signs/symptoms of physical distress.   Average METs  2.25  2.6  -  2.4  3.35     Resistance Training   Training Prescription  Yes  Yes  -  Yes  Yes   Weight  2 lb  2 lb  -  2 lb  2 lb   Reps  10-15  10-15  -  10-15  10-15     Interval Training   Interval Training  -  No  -  No  No     Treadmill   MPH  2  2.4  -  -  2.6   Grade  0  1  -  -  2   Minutes  15  15  -  -  15   METs  2.52  3.17  -  -  3.71     NuStep   Level  -  3  -  3  3   SPM  -  96  -  92  81   Minutes  -  15  -  15  15   METs  -  3  -  3.3  3     REL-XR   Level  1  1  -  2  -   Speed  53  60  -  48  -   Minutes  15  15  -  15  -   METs  2.1  1.6  -  1.5  -  Home Exercise Plan   Plans to continue exercise at  -  -  Home (comment) walking  Home (comment) walking  Home (comment) walking   Frequency  -  -  Add 1 additional day to program exercise sessions.  Add 1 additional day to program exercise sessions.  Add 1 additional day to program exercise sessions.   Initial Home Exercises Provided  -  -  10/09/17  10/09/17  10/09/17   Row Name 11/18/17 1200             Response to Exercise   Blood Pressure (Admit)  122/60       Blood Pressure (Exit)  122/70       Heart Rate (Admit)  94 bpm       Heart Rate (Exercise)  101 bpm       Heart Rate (Exit)  67 bpm       Oxygen Saturation (Admit)  97 %       Oxygen Saturation (Exercise)  96 %       Oxygen Saturation (Exit)  96 %       Rating of Perceived Exertion (Exercise)  13       Perceived Dyspnea (Exercise)  1       Symptoms  none       Duration  Continue with 45 min of aerobic exercise without signs/symptoms of physical distress.       Intensity  THRR unchanged         Progression   Progression  Continue to progress workloads to maintain intensity without signs/symptoms of physical distress.       Average METs  2.7         Resistance Training   Training Prescription  Yes       Weight  3 lb       Reps  10-15         Interval Training   Interval  Training  No         NuStep   Level  4       SPM  73       Minutes  15       METs  2.8         REL-XR   Level  4       Speed  42       Minutes  15       METs  2.6         Home Exercise Plan   Plans to continue exercise at  Home (comment) walking       Frequency  Add 1 additional day to program exercise sessions.       Initial Home Exercises Provided  10/09/17          Exercise Comments: Exercise Comments    Row Name 08/17/17 1130           Exercise Comments  First full day of exercise!  Patient was oriented to gym and equipment including functions, settings, policies, and procedures.  Patient's individual exercise prescription and treatment plan were reviewed.  All starting workloads were established based on the results of the 6 minute walk test done at initial orientation visit.  The plan for exercise progression was also introduced and progression will be customized based on patient's performance and goals          Exercise Goals and Review:   Exercise Goals Re-Evaluation : Exercise Goals Re-Evaluation    Row Name 08/17/17  1141 08/26/17 1202 09/09/17 1208 10/07/17 1254 10/09/17 1140     Exercise Goal Re-Evaluation   Exercise Goals Review  Increase Physical Activity;Increase Strenth and Stamina  Increase Physical Activity  -  Increase Physical Activity;Increase Strength and Stamina;Able to understand and use rate of perceived exertion (RPE) scale;Able to understand and use Dyspnea scale  Increase Physical Activity;Increase Strength and Stamina;Knowledge and understanding of Target Heart Rate Range (THRR);Able to understand and use rate of perceived exertion (RPE) scale;Understanding of Exercise Prescription   Comments  Marrion came to her first day of exercise today. She had been out for several week since her initail medical review.   Sissi attended her first two sessions.  She tolerated exercise well.    Kynzlie has not attended since 8/22.  Britni is tolerating exercise  well.  She has increased levels on all machines.  Reviewed home exercise with pt today.  Pt plans to walk an extra day at home for exercise.  Reviewed THR, pulse, RPE, sign and symptoms, NTG use, and when to call 911 or MD.  Also discussed weather considerations and indoor options.  Pt voiced understanding   Expected Outcomes  Short: Halima will be consistant with her attendance to pulmonary rehab. Long: With consistant attendance and exercise Florinda will gain strength and stamina to progress with her exercise in rehab and other ADL's.   Short - Achol will attend LW regularly.  Long - Lalaine will improve overall fitness.  -  Short - Elgie will continue to attend regularly.  Long - Jourdan will continue to improve overall fitness level.  Short: add 1 extra day of walking to home exercise. Long: Maintain a home exercise routine and add additional days of walking   Row Name 10/21/17 1523 11/04/17 1110 11/18/17 1217         Exercise Goal Re-Evaluation   Exercise Goals Review  Increase Physical Activity;Increase Strength and Stamina  Increase Physical Activity;Increase Strength and Stamina;Able to understand and use rate of perceived exertion (RPE) scale;Able to understand and use Dyspnea scale  Increase Physical Activity;Increase Strength and Stamina;Able to understand and use rate of perceived exertion (RPE) scale;Able to understand and use Dyspnea scale     Comments  Shahad is tolerating exercise well and has increased resistance on all equipment.    Shawnetta is progressing well with exercise.  She has increased overall MET level.  Lyly is progressing well with exercise.  She has seen some weight loss.  She is slowly increasing her levels on machines.     Expected Outcomes  Short - Rayetta will continue to attend consistently.  Long - Anabia will see continued improvements in MET level.  Short - Sundi will continue to progress workloads. Long - Kamree willl maintain fitness improvements.  Short - Sascha will  continue to attend regularly.  Long - Glenys will continue to improve overall fitness levels.        Discharge Exercise Prescription (Final Exercise Prescription Changes): Exercise Prescription Changes - 11/18/17 1200      Response to Exercise   Blood Pressure (Admit)  122/60    Blood Pressure (Exit)  122/70    Heart Rate (Admit)  94 bpm    Heart Rate (Exercise)  101 bpm    Heart Rate (Exit)  67 bpm    Oxygen Saturation (Admit)  97 %    Oxygen Saturation (Exercise)  96 %    Oxygen Saturation (Exit)  96 %    Rating of Perceived Exertion (Exercise)  13    Perceived Dyspnea (Exercise)  1    Symptoms  none    Duration  Continue with 45 min of aerobic exercise without signs/symptoms of physical distress.    Intensity  THRR unchanged      Progression   Progression  Continue to progress workloads to maintain intensity without signs/symptoms of physical distress.    Average METs  2.7      Resistance Training   Training Prescription  Yes    Weight  3 lb    Reps  10-15      Interval Training   Interval Training  No      NuStep   Level  4    SPM  73    Minutes  15    METs  2.8      REL-XR   Level  4    Speed  42    Minutes  15    METs  2.6      Home Exercise Plan   Plans to continue exercise at  Home (comment) walking    Frequency  Add 1 additional day to program exercise sessions.    Initial Home Exercises Provided  10/09/17       Nutrition:  Target Goals: Understanding of nutrition guidelines, daily intake of sodium <1524m, cholesterol <2075m calories 30% from fat and 7% or less from saturated fats, daily to have 5 or more servings of fruits and vegetables.  Biometrics:    Nutrition Therapy Plan and Nutrition Goals: Nutrition Therapy & Goals - 10/29/17 1755      Nutrition Therapy   Diet  basic healthy diet for weight loss, low sodium    Drug/Food Interactions  Statins/Certain Fruits    Protein (specify units)  8oz    Fruits and Vegetables  5 servings/day     Sodium  1500 grams      Personal Nutrition Goals   Personal Goal #2  Continue with current healthy food choices and regular exercise    Personal Goal #3  Use food lists and menus given to help keep nutritionally balanced meals and include variety of choices    Personal Goal #4  Keep working to limit sweets by eating small portions and limit how often; try tracking intake of sweets to stay aware of how much and how often you eat them.     Comments  Ms. Albornoz has made significant positive changes on her own, and reports weight loss of 15lbs since starting [facility] classes. She is motivated to continue.        Nutrition Assessments:   Nutrition Goals Re-Evaluation: Nutrition Goals Re-Evaluation    Row Name 08/17/17 1139 10/09/17 1415 10/26/17 1658         Goals   Current Weight  206 lb (93.4 kg)  201 lb (91.2 kg)  196 lb (88.9 kg)     Nutrition Goal  Has a scheduled appointment to meet with the dietician on 09/10/17 to set more specific nutrition goal.   Lose weight and adhere to a healthier diet.  Adhere to a diet plan and continue to lose weight.     Comment  -  BrOriyatates she has been eating more vegetables and salad. She has been eating less red meat. She met with the dietician and has a good outlook for what to eat.  BrYenas been trying to lose weight. So far she has done a great job. She states she has been eating smaller portions which has  helped her alot. Her appointment for the Nutritionist is November 1st.     Expected Outcome  -  Short: lose weight by eating healthy. Long: Adhere to a diet plan to maintain weight loss.  Short:meet with the nutritionist. Long: Maintain a healthy diet.        Nutrition Goals Discharge (Final Nutrition Goals Re-Evaluation): Nutrition Goals Re-Evaluation - 10/26/17 1658      Goals   Current Weight  196 lb (88.9 kg)    Nutrition Goal  Adhere to a diet plan and continue to lose weight.    Comment  Ceylin has been trying to lose weight. So  far she has done a great job. She states she has been eating smaller portions which has helped her alot. Her appointment for the Nutritionist is November 1st.    Expected Outcome  Short:meet with the nutritionist. Long: Maintain a healthy diet.       Psychosocial: Target Goals: Acknowledge presence or absence of significant depression and/or stress, maximize coping skills, provide positive support system. Participant is able to verbalize types and ability to use techniques and skills needed for reducing stress and depression.   Initial Review & Psychosocial Screening:   Quality of Life Scores:  Scores of 19 and below usually indicate a poorer quality of life in these areas.  A difference of  2-3 points is a clinically meaningful difference.  A difference of 2-3 points in the total score of the Quality of Life Index has been associated with significant improvement in overall quality of life, self-image, physical symptoms, and general health in studies assessing change in quality of life.  PHQ-9: Recent Review Flowsheet Data    Depression screen [facility] 2/9 11/18/2017 09/30/2017 07/20/2017   Decreased Interest _0 Down, Depressed, Hopeless 1 0 0   PHQ - 2 Score _1 Altered sleeping _2 Tired, decreased energy _3 Change in appetite _4 Feeling bad or failure about yourself  0 1 1   Trouble concentrating _5 Moving slowly or fidgety/restless 0 0 0   Suicidal thoughts 0 0 0   PHQ-9 Score _6 Difficult doing work/chores Somewhat difficult Somewhat difficult Somewhat difficult     Interpretation of Total Score  Total Score Depression Severity:  1-4 = Minimal depression, 5-9 = Mild depression, 10-14 = Moderate depression, 15-19 = Moderately severe depression, 20-27 = Severe depression   Psychosocial Evaluation and Intervention: Psychosocial Evaluation - 08/17/17 1028      Psychosocial Evaluation & Interventions   Interventions  Encouraged to exercise with the  program and follow exercise prescription;Relaxation education;Stress management education    Comments  Counselor met with Ms. Sunday Corn today Hassan Rowan) for initial psychosocial evaluation.  She is a 67 year old who has asthma and many other health issues, including RA; Fibromyalgia; HBP and High Cholesterol; obesity and she had her thyroid removed approximately 10 years ago.  Krisna has a strong support system with a spouse of 22 years and she is actively involved in her local church.  Ernestine has chronic sleep problems with maybe 4 nights of 4-6 hours per week on average.  She denies a history of depression or anxiety or any current symptoms and states she is typically in a positive mood most of the time.  Mikaiya has multiple stressors with her health; caring for her mother with dementia and finances.  She has goals to lose weight and increase her stamina and strength.  Rayshell will be followed by staff throughout the course of this program.      Expected Outcomes  Skiler will benefit from consistent exercise to achieve her stated goals.  She will be seeing the dietician who will help her address her weight loss goals.  Camyra will also benefit from the educational and psychoeducational components of this program to help her manage her medical issues and cope better with her stressors.      Continue Psychosocial Services   Follow up required by staff       Psychosocial Re-Evaluation: Psychosocial Re-Evaluation    [place] Name 09/30/17 1129 10/26/17 1702 11/18/17 1052         Psychosocial Re-Evaluation   Current issues with  Current Stress Concerns;Current Sleep Concerns  Current Stress Concerns;Current Sleep Concerns  Current Depression;Current Sleep Concerns;Current Stress Concerns     Comments  Counselor follow up with Hassan Rowan today reporting her sleep has improved slightly since she came into the program. 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  Amaiyah has been out of class for several weeks due to her mother's health; which resulted in her mother going into a nursing home during that time.  Dawnya reports taking that opportunity to focus on her personal self-care and attended to much needed medical appointments.  Also she has been able to have additional help for her mother in place.  This reduces her stress.  Gabbriella reports exercising also helps reduce her stress and she is happy to be back in class.  She received a phone call from a Dr.'s office while meeting with counselor and became a little stressed about that interruption.  Counselor encouraged Adryanna to turn her phone off during her self-care time - like this class.  Her spouse knows how to care for Geryl's mother and she agreed this would help her focus more and enjoy the class more.  Murrel states finances continue to be a stressor but her spouse may be getting disability soon and that will be helpful.  Counselor commended Keneshia for her progress made and commitment to exercise and positive self-care.   Favor's current stressor is taking care of her mom with dementia. She enjoys class but sometimes cant focus like she would like to.  Counselor follow up with Chassie today reporting has a "stress headache" today.  Processed the stress in her life and states she continues to be up and down all night so her sleep continues to be interrupted.  She also continues to be the primary caregiver for her mother and that is stressful as well.  Bresha reports her energy level has improved and she is more comfortable exercising and loves the self-care/social aspects of this program.  She states her "attitude has improved" since coming more consistently.  She also has lost about 13 pounds which was one of her goals!  She mentioned having a stress fracture in her left foot and may need a boot for that.  Doesn't know if this will impact her ability to complete this program  currently.  Counselor commended Laveda for all her hard work and progress made.      Expected Outcomes  Annika will continue to exercise consistently.  She will begin to set better limits on her time in class by turning her phone off to decrease her stress and  improve her self-care.  She will need to meet with the dietician since she missed that appointment while out with her mother.    Short: attend [facility] regularly to decrease stress. Long: Maintain an exercise to keep stress to a minimum.  Tarri will continue to exercise to help with stress and improved health.  She was encouraged to address her sleep issues with her Dr. as it has been chronic - but she reports caring for her mother impacts her ability to rest well overall.  Zakariah will continue to practice stress management techniques shared in the educational component of this class.       Interventions  -  Encouraged to attend [facility] for the exercise  Stress management education;Relaxation education     Continue Psychosocial Services   Follow up required by staff  Follow up required by staff  Follow up required by staff       Initial Review   Source of Stress Concerns  Family;Financial  -  -        Psychosocial Discharge (Final Psychosocial Re-Evaluation): Psychosocial Re-Evaluation - 11/18/17 1052      Psychosocial Re-Evaluation   Current issues with  Current Depression;Current Sleep Concerns;Current Stress Concerns    Comments  Counselor follow up with Marifer today reporting has a "stress headache" today.  Processed the stress in her life and states she continues to be up and down all night so her sleep continues to be interrupted.  She also continues to be the primary caregiver for her mother and that is stressful as well.  Mayvis reports her energy level has improved and she is more comfortable exercising and loves the self-care/social aspects of this program.  She states her "attitude has improved" since coming more  consistently.  She also has lost about 13 pounds which was one of her goals!  She mentioned having a stress fracture in her left foot and may need a boot for that.  Doesn't know if this will impact her ability to complete this program currently.  Counselor commended Neetu for all her hard work and progress made.     Expected Outcomes  Stepanie will continue to exercise to help with stress and improved health.  She was encouraged to address her sleep issues with her Dr. as it has been chronic - but she reports caring for her mother impacts her ability to rest well overall.  Azariah will continue to practice stress management techniques shared in the educational component of this class.      Interventions  Stress management education;Relaxation education    Continue Psychosocial Services   Follow up required by staff       Education: Education Goals: Education classes will be provided on a weekly basis, covering required topics. Participant will state understanding/return demonstration of topics presented.  Learning Barriers/Preferences:   Education Topics: Initial Evaluation Education: - Verbal, written and demonstration of respiratory meds, RPE/PD scales, oximetry and breathing techniques. Instruction on use of nebulizers and MDIs: cleaning and proper use, rinsing mouth with steroid doses and importance of monitoring MDI activations.   Pulmonary Rehab from 11/18/2017 in [facility] Cardiac and Pulmonary Rehab  Date  07/20/17  Educator  [facility]  Instruction Review Code (retired)  2- meets goals/outcomes  Instruction Review Code  1- IT trainer Nutrition Guidelines/Fats and Fiber: -Group instruction provided by verbal, written material, models and posters to present the general guidelines for heart healthy nutrition. Gives an explanation and review of  dietary fats and fiber.   Pulmonary Rehab from 11/18/2017 in [facility] Cardiac and Pulmonary Rehab  Date  08/17/17  Educator  CR       Controlling Sodium/Reading Food Labels: -Group verbal and written material supporting the discussion of sodium use in heart healthy nutrition. Review and explanation with models, verbal and written materials for utilization of the food label.   Pulmonary Rehab from 11/18/2017 in [facility] Cardiac and Pulmonary Rehab  Date  10/19/17  Educator  CR  Instruction Review Code  5- Refused Teaching      Exercise Physiology & Risk Factors: - Group verbal and written instruction with models to review the exercise physiology of the cardiovascular system and associated critical values. Details cardiovascular disease risk factors and the goals associated with each risk factor.   Pulmonary Rehab from 11/18/2017 in [facility] Cardiac and Pulmonary Rehab  Date  10/02/17  Educator  Nada Maclachlan, EP  Instruction Review Code  1- Verbalizes Understanding      Aerobic Exercise & Resistance Training: - Gives group verbal and written discussion on the health impact of inactivity. On the components of aerobic and resistive training programs and the benefits of this training and how to safely progress through these programs.   Pulmonary Rehab from 11/18/2017 in [facility] Cardiac and Pulmonary Rehab  Date  10/30/17  Educator  [facility]  Instruction Review Code  1- Verbalizes Understanding      Flexibility, Balance, General Exercise Guidelines: - Provides group verbal and written instruction on the benefits of flexibility and balance training programs. Provides general exercise guidelines with specific guidelines to those with heart or lung disease. Demonstration and skill practice provided.   Pulmonary Rehab from 11/18/2017 in [facility] Cardiac and Pulmonary Rehab  Date  11/18/17  Educator  [facility]  Instruction Review Code  1- Verbalizes Understanding      Stress Management: - Provides group verbal and written instruction about the health risks of elevated stress, cause of high stress, and healthy ways to reduce  stress.   Depression: - Provides group verbal and written instruction on the correlation between heart/lung disease and depressed mood, treatment options, and the stigmas associated with seeking treatment.   Pulmonary Rehab from 11/18/2017 in [facility] Cardiac and Pulmonary Rehab  Date  08/19/17  Educator  [facility]  Instruction Review Code (retired)  2- meets goals/outcomes  Instruction Review Code  1- Science writer Understanding      Exercise & Equipment Safety: - Individual verbal instruction and demonstration of equipment use and safety with use of the equipment.   Pulmonary Rehab from 11/18/2017 in [facility] Cardiac and Pulmonary Rehab  Date  08/17/17  Educator  [facility]  Instruction Review Code  1- Verbalizes Understanding      Infection Prevention: - Provides verbal and written material to individual with discussion of infection control including proper hand washing and proper equipment cleaning during exercise session.   Pulmonary Rehab from 11/18/2017 in [facility] Cardiac and Pulmonary Rehab  Date  07/20/17  Educator  [facility]  Instruction Review Code  1- Verbalizes Understanding      Falls Prevention: - Provides verbal and written material to individual with discussion of falls prevention and safety.   Pulmonary Rehab from 11/18/2017 in [facility] Cardiac and Pulmonary Rehab  Date  07/20/17  Educator  [facility]  Instruction Review Code (retired)  2- meets goals/outcomes  Instruction Review Code  1- Science writer Understanding      Diabetes: - Individual verbal and written instruction to review signs/symptoms of diabetes, desired ranges  of glucose level fasting, after meals and with exercise. Advice that pre and post exercise glucose checks will be done for 3 sessions at entry of program.   Pulmonary Rehab from 11/18/2017 in [facility] Cardiac and Pulmonary Rehab  Date  07/20/17  Educator  [facility]  Instruction Review Code  1- Verbalizes Understanding      Chronic Lung Diseases: - Group verbal and written instruction to  review new updates, new respiratory medications, new advancements in procedures and treatments. Provide informative websites and "800" numbers of self-education.   Pulmonary Rehab from 11/18/2017 in [facility] Cardiac and Pulmonary Rehab  Date  09/30/17  Educator  [facility]  Instruction Review Code  1- Verbalizes Understanding      Lung Procedures: - Group verbal and written instruction to describe testing methods done to diagnose lung disease. Review the outcome of test results. Describe the treatment choices: Pulmonary Function Tests, ABGs and oximetry.   Energy Conservation: - Provide group verbal and written instruction for methods to conserve energy, plan and organize activities. Instruct on pacing techniques, use of adaptive equipment and posture/positioning to relieve shortness of breath.   Triggers: - Group verbal and written instruction to review types of environmental controls: home humidity, furnaces, filters, dust mite/pet prevention, HEPA vacuums. To discuss weather changes, air quality and the benefits of nasal washing.   Exacerbations: - Group verbal and written instruction to provide: warning signs, infection symptoms, calling MD promptly, preventive modes, and value of vaccinations. Review: effective airway clearance, coughing and/or vibration techniques. Create an Sports administrator.   Oxygen: - Individual and group verbal and written instruction on oxygen therapy. Includes supplement oxygen, available portable oxygen systems, continuous and intermittent flow rates, oxygen safety, concentrators, and Medicare reimbursement for oxygen.   Respiratory Medications: - Group verbal and written instruction to review medications for lung disease. Drug class, frequency, complications, importance of spacers, rinsing mouth after steroid MDI's, and proper cleaning methods for nebulizers.   AED/CPR: - Group verbal and written instruction with the use of models to demonstrate the basic use of the AED  with the basic ABC's of resuscitation.   Breathing Retraining: - Provides individuals verbal and written instruction on purpose, frequency, and proper technique of diaphragmatic breathing and pursed-lipped breathing. Applies individual practice skills.   Pulmonary Rehab from 11/18/2017 in [facility] Cardiac and Pulmonary Rehab  Date  08/17/17  Educator  [facility]  Instruction Review Code  1- Actuary and Physiology of the Lungs: - Group verbal and written instruction with the use of models to provide basic lung anatomy and physiology related to function, structure and complications of lung disease.   Anatomy & Physiology of the Heart: - Group verbal and written instruction and models provide basic cardiac anatomy and physiology, with the coronary electrical and arterial systems. Review of: AMI, Angina, Valve disease, Heart Failure, Cardiac Arrhythmia, Pacemakers, and the ICD.   Pulmonary Rehab from 11/18/2017 in [facility] Cardiac and Pulmonary Rehab  Date  11/13/17  Educator  [facility]  Instruction Review Code  1- Verbalizes Understanding      Heart Failure: - Group verbal and written instruction on the basics of heart failure: signs/symptoms, treatments, explanation of ejection fraction, enlarged heart and cardiomyopathy.   Sleep Apnea: - Individual verbal and written instruction to review Obstructive Sleep Apnea. Review of risk factors, methods for diagnosing and types of masks and machines for OSA.   Anxiety: - Provides group, verbal and written instruction on the correlation between heart/lung disease  and anxiety, treatment options, and management of anxiety.   Relaxation: - Provides group, verbal and written instruction about the benefits of relaxation for patients with heart/lung disease. Also provides patients with examples of relaxation techniques.   Cardiac Medications: - Group verbal and written instruction to review commonly prescribed medications for heart  disease. Reviews the medication, class of the drug, and side effects.   Know Your Numbers: -Group verbal and written instruction about important numbers in your health.  Review of Cholesterol, Blood Pressure, Diabetes, and BMI and the role they play in your overall health.   Pulmonary Rehab from 11/18/2017 in [facility] Cardiac and Pulmonary Rehab  Date  10/09/17  Educator  [facility]  Instruction Review Code  1- Verbalizes Understanding      Other: -Provides group and verbal instruction on various topics (see comments)    Knowledge Questionnaire Score:    Core Components/Risk Factors/Patient Goals at Admission:   Core Components/Risk Factors/Patient Goals Review:  Goals and Risk Factor Review    [place] Name 08/17/17 1136 10/16/17 1123 10/26/17 1655         Core Components/Risk Factors/Patient Goals Review   Personal Goals Review  Weight Management/Obesity  Weight Management/Obesity;Improve shortness of breath with ADL's;Diabetes;Stress  Weight Management/Obesity;Stress;Diabetes;Improve shortness of breath with ADL's     Review  Weight loss was discussed with that patient. We discussed how physical activity and exercise along with dietary changes can aid in this weight loss goal. She was encouraged to make an appointment with the dietician, which she did.   Maley has been dealing with her mother and her dementia. She has lost a little weight since the start of the program. Some days are better than others with her breathing. She states when it gets cold she can feel it in her lungs. She also does not do well with hot weather.  Lavern has been stressed taking care of her mother with dementia. She is down ten pounds since the start of the program.      Expected Outcomes  Short: Sharita has a short term goal of losing 1-2 lbs per week and aiming towards 196 lbs( 10 lbs loss). Shloka has will meet with the dietician on 09/10/17 and will continue to consistantly come to  pulmonary rehab for exercise.  Long:  Long term weight goal is 150 lbs.   Short: Attend class regularyly to improve ADL's. Long: Maintain routine exercise to improve ADL's independently.  Short: Attemd [facility] to reduce stress. Long: maintain a stress free environment.        Core Components/Risk Factors/Patient Goals at Discharge (Final Review):  Goals and Risk Factor Review - 10/26/17 1655      Core Components/Risk Factors/Patient Goals Review   Personal Goals Review  Weight Management/Obesity;Stress;Diabetes;Improve shortness of breath with ADL's    Review  Kavita has been stressed taking care of her mother with dementia. She is down ten pounds since the start of the program.     Expected Outcomes  Short: Attemd [facility] to reduce stress. Long: maintain a stress free environment.       ITP Comments: ITP Comments    [place] Name 07/27/17 0845 07/28/17 7510 08/17/17 1038 08/24/17 0811 09/09/17 1207   ITP Comments  30 day review completed ITP sent to Dr. Ramonita Lab for Dr. Emily Filbert Director of [place]. Continue with ITP unless changes are made by physician. Patient has not yet had first day of exercise   Ms. Morrical called to say she cannot start until  Monday August 20th due to her mom needing a sitter while her husband is away.  Patient had her first day of exercise. She felt a little short of breath post exercise and took her albuterol MDI. Patient recovered and continued to exercise.  30 day review completed. ITP sent to Dr. Emily Filbert Director of [place]. Continue with ITP unless changes are made by physician.    Malachi has not attended since 8/22.     [place] Name 09/21/17 8118 09/23/17 1148 09/23/17 1411 10/19/17 0804 11/16/17 0807   ITP Comments  30 day review completed. ITP sent to Dr. Emily Filbert Director of [place]. Continue with ITP unless changes are made by physician.    Jennilee has not attended since 08/19/17.  Called to see when and If Parris would be able to resume [facility]. Message left.  30 day review completed.  ITP sent to Dr. Emily Filbert Director of [place]. Continue with ITP unless changes are made by physician.    30 day review completed. ITP sent to Dr. Emily Filbert Director of [place]. Continue with ITP unless changes are made by physician.     [place] Name 11/27/17 8677 12/11/17 3736 12/14/17 0807 01/11/18 0804 01/11/18 0807   ITP Comments  Chaunte called to say that a stress fracture in her foot. Her mom has an appointment today and she will be fitted for a boot for Monday. She hopes to be back on Wednesday.  Odean called to say she would be back the first of the year. Her husband is going out of town and her foot has been hurting. She states she will be back in full force after the first of the year.  30 day review completed. ITP sent to Dr. Emily Filbert Director of [place]. Continue with ITP unless changes are made by physician.    Goals not done patient has not attended since 11/18/17. She states she will be back in 01/13/18  30 day review completed. ITP sent to Dr. Emily Filbert Director of [place]. Continue with ITP unless changes are made by physician.   [place] Name 01/20/18 1408           ITP Comments  Called to check on Ouita.  She has not attended since the middle November.  She is still waiting for clearance to return.  She has more test scheduled.  We talked about discharge and coming back to restart once cleared since she has missed so much time.  Shikara was agreeable to this plan.  We will discharge her at this time with the plan to re-enroll once cleared to exercise again with the hopes of better attendance at that point.           Comments: Discharge ITP

## 2018-01-20 NOTE — Telephone Encounter (Signed)
Called to check on Sandra Fischer.  She has not attended since the middle November.  She is still waiting for clearance to return.  She has more test scheduled.  We talked about discharge and coming back to restart once cleared since she has missed so much time.  [name] was agreeable to this plan.  We will discharge her at this time with the plan to re-enroll once cleared to exercise again with the hopes of better attendance at that point.

## 2018-02-09 ENCOUNTER — Telehealth: Payer: Self-pay | Admitting: *Deleted

## 2018-02-09 ENCOUNTER — Encounter: Payer: Self-pay | Admitting: Allergy and Immunology

## 2018-02-09 ENCOUNTER — Ambulatory Visit (INDEPENDENT_AMBULATORY_CARE_PROVIDER_SITE_OTHER): Payer: Non-veteran care | Admitting: Allergy and Immunology

## 2018-02-09 VITALS — BP 108/76 | HR 66 | Ht 62.0 in | Wt 184.4 lb

## 2018-02-09 DIAGNOSIS — H1013 Acute atopic conjunctivitis, bilateral: Secondary | ICD-10-CM | POA: Diagnosis not present

## 2018-02-09 DIAGNOSIS — J4540 Moderate persistent asthma, uncomplicated: Secondary | ICD-10-CM | POA: Insufficient documentation

## 2018-02-09 DIAGNOSIS — Z91018 Allergy to other foods: Secondary | ICD-10-CM | POA: Diagnosis not present

## 2018-02-09 DIAGNOSIS — T7840XA Allergy, unspecified, initial encounter: Secondary | ICD-10-CM | POA: Insufficient documentation

## 2018-02-09 DIAGNOSIS — J3089 Other allergic rhinitis: Secondary | ICD-10-CM

## 2018-02-09 DIAGNOSIS — T7840XD Allergy, unspecified, subsequent encounter: Secondary | ICD-10-CM

## 2018-02-09 DIAGNOSIS — H1010 Acute atopic conjunctivitis, unspecified eye: Secondary | ICD-10-CM | POA: Insufficient documentation

## 2018-02-09 MED ORDER — OLOPATADINE HCL 0.1 % OP SOLN: 1.0000 [drp] | Freq: Two times a day (BID) | OPHTHALMIC | 5 refills | Status: AC

## 2018-02-09 MED ORDER — AZELASTINE HCL 0.1 % NA SOLN: 2.0000 | Freq: Two times a day (BID) | NASAL | 5 refills | Status: AC

## 2018-02-09 NOTE — Assessment & Plan Note (Signed)
Currently stable.  Continue Symbicort 160-4.5 g, 2 inhalations via spacer device twice daily, Spiriva 18 g daily, montelukast 10 mg daily, and albuterol every 6 hours if needed.  Subjective and objective measures of pulmonary function will be followed and the treatment plan will be adjusted accordingly.

## 2018-02-09 NOTE — Telephone Encounter (Signed)
-----   Message from Adelina Mings, MD sent at 02/09/2018  5:18 PM EST ----- Please call the patient to tell her we need to check some labs regarding the allergic reaction she had a few years ago. Please submit a laboratory order for serum specific IgE against fish panel shellfish panel, alpha gal panel, as well as a serum tryptase level. Thanks.

## 2018-02-09 NOTE — Assessment & Plan Note (Signed)
   Treatment plan as outlined above for allergic rhinitis.  A prescription has been provided for Patanol, one drop per eye twice daily as needed.  I have also recommended eye lubricant drops (i.e., Natural Tears) as needed. 

## 2018-02-09 NOTE — Patient Instructions (Signed)
Perennial and seasonal allergic rhinitis  Aeroallergen avoidance measures have been discussed and provided in written form.  A prescription has been provided for azelastine nasal spray, 1-2 sprays per nostril 2 times daily as needed. Proper nasal spray technique has been discussed and demonstrated.   Nasal saline spray (i.e., Simply Saline) or nasal saline lavage (i.e., NeilMed) is recommended as needed and prior to medicated nasal sprays.  A prescription has been provided for fexofenadine (Allegra) 180 mg daily if needed.  The risks and benefits of aeroallergen immunotherapy have been discussed. The patients mother is motivated to initiate immunotherapy to reduce symptoms and decrease medication requirement. Informed consent has been signed and allergen vaccine orders have been submitted. Medications will be decreased or discontinued as symptom relief from immunotherapy becomes evident.  Allergic conjunctivitis  Treatment plan as outlined above for allergic rhinitis.  A prescription has been provided for Patanol, one drop per eye twice daily as needed.  I have also recommended eye lubricant drops (i.e., Natural Tears) as needed.  Moderate persistent asthma Currently stable.  Continue Symbicort 160-4.5 g, 2 inhalations via spacer device twice daily, Spiriva 18 g daily, montelukast 10 mg daily, and albuterol every 6 hours if needed.  Subjective and objective measures of pulmonary function will be followed and the treatment plan will be adjusted accordingly.   Return for allergy injections and return for office visit follow-up in about 3 months (around 05/09/2018), or if symptoms worsen or fail to improve.  Reducing Pollen Exposure  The American Academy of Allergy, Asthma and Immunology suggests the following steps to reduce your exposure to pollen during allergy seasons.    1. Do not hang sheets or clothing out to dry; pollen may collect on these items. 2. Do not mow lawns or  spend time around freshly cut grass; mowing stirs up pollen. 3. Keep windows closed at night.  Keep car windows closed while driving. 4. Minimize morning activities outdoors, a time when pollen counts are usually at their highest. 5. Stay indoors as much as possible when pollen counts or humidity is high and on windy days when pollen tends to remain in the air longer. 6. Use air conditioning when possible.  Many air conditioners have filters that trap the pollen spores. 7. Use a HEPA room air filter to remove pollen form the indoor air you breathe.   Control of House Dust Mite Allergen  House dust mites play a major role in allergic asthma and rhinitis.  They occur in environments with high humidity wherever human skin, the food for dust mites is found. High levels have been detected in dust obtained from mattresses, pillows, carpets, upholstered furniture, bed covers, clothes and soft toys.  The principal allergen of the house dust mite is found in its feces.  A gram of dust may contain 1,000 mites and 250,000 fecal particles.  Mite antigen is easily measured in the air during house cleaning activities.    1. Encase mattresses, including the box spring, and pillow, in an air tight cover.  Seal the zipper end of the encased mattresses with wide adhesive tape. 2. Wash the bedding in water of 130 degrees Farenheit weekly.  Avoid cotton comforters/quilts and flannel bedding: the most ideal bed covering is the dacron comforter. 3. Remove all upholstered furniture from the bedroom. 4. Remove carpets, carpet padding, rugs, and non-washable window drapes from the bedroom.  Wash drapes weekly or use plastic window coverings. 5. Remove all non-washable stuffed toys from the bedroom.  Wash  stuffed toys weekly. 6. Have the room cleaned frequently with a vacuum cleaner and a damp dust-mop.  The patient should not be in a room which is being cleaned and should wait 1 hour after cleaning before going into the  room. 7. Close and seal all heating outlets in the bedroom.  Otherwise, the room will become filled with dust-laden air.  An electric heater can be used to heat the room. Reduce indoor humidity to less than 50%.  Do not use a humidifier.  Control of Dog or Cat Allergen  Avoidance is the best way to manage a dog or cat allergy. If you have a dog or cat and are allergic to dog or cats, consider removing the dog or cat from the home. If you have a dog or cat but don't want to find it a new home, or if your family wants a pet even though someone in the household is allergic, here are some strategies that may help keep symptoms at bay:  1. Keep the pet out of your bedroom and restrict it to only a few rooms. Be advised that keeping the dog or cat in only one room will not limit the allergens to that room. 2. Don't pet, hug or kiss the dog or cat; if you do, wash your hands with soap and water. 3. High-efficiency particulate air (HEPA) cleaners run continuously in a bedroom or living room can reduce allergen levels over time. 4. Place electrostatic material sheet in the air inlet vent in the bedroom. 5. Regular use of a high-efficiency vacuum cleaner or a central vacuum can reduce allergen levels. 6. Giving your dog or cat a bath at least once a week can reduce airborne allergen.  Control of Mold Allergen  Mold and fungi can grow on a variety of surfaces provided certain temperature and moisture conditions exist.  Outdoor molds grow on plants, decaying vegetation and soil.  The major outdoor mold, Alternaria and Cladosporium, are found in very high numbers during hot and dry conditions.  Generally, a late Summer - Fall peak is seen for common outdoor fungal spores.  Rain will temporarily lower outdoor mold spore count, but counts rise rapidly when the rainy period ends.  The most important indoor molds are Aspergillus and Penicillium.  Dark, humid and poorly ventilated basements are ideal sites for mold  growth.  The next most common sites of mold growth are the bathroom and the kitchen.  Outdoor [employer] 1. Use air conditioning and keep windows closed 2. Avoid exposure to decaying vegetation. 3. Avoid leaf raking. 4. Avoid grain handling. 5. Consider wearing a face mask if working in moldy areas.  Indoor Mold Control 1. Maintain humidity below 50%. 2. Clean washable surfaces with 5% bleach solution. 3. Remove sources e.g. Contaminated carpets.

## 2018-02-09 NOTE — Assessment & Plan Note (Signed)
Possible food allergy.  The patients history suggests fish allergy, though todays skin tests were negative despite a positive histamine control.  Food allergen skin testing has excellent negative predictive value however there is still a 5% chance that the allergy exists.  Therefore, we will investigate further with serum specific IgE levels and, if negative, open graded oral challenge.  A laboratory order form has been provided for serum specific IgE against fish panel shellfish panel, alpha gal panel, as well as a serum tryptase level.  Until the food allergy has been definitively ruled out, the patient is to continue meticulous avoidance and have access to epinephrine autoinjector 2 pack.

## 2018-02-09 NOTE — Assessment & Plan Note (Addendum)
   Aeroallergen avoidance measures have been discussed and provided in written form.  A prescription has been provided for azelastine nasal spray, 1-2 sprays per nostril 2 times daily as needed. Proper nasal spray technique has been discussed and demonstrated.   Nasal saline spray (i.e., Simply Saline) or nasal saline lavage (i.e., NeilMed) is recommended as needed and prior to medicated nasal sprays.  A prescription has been provided for fexofenadine (Allegra) 180 mg daily if needed.  The risks and benefits of aeroallergen immunotherapy have been discussed. The patients mother is motivated to initiate immunotherapy to reduce symptoms and decrease medication requirement. Informed consent has been signed and allergen vaccine orders have been submitted. Medications will be decreased or discontinued as symptom relief from immunotherapy becomes evident.

## 2018-02-09 NOTE — Progress Notes (Signed)
New Patient Note  RE: Sandra Fischer MRN: [id number] DOB: 1951/05/09 Date of Office Visit: 02/09/2018  Referring provider: Felipa Eth, MD Primary care provider: Felipa Eth, MD  Chief Complaint: Allergic Rhinitis ; Conjunctivitis; and Asthma   History of present illness: Sandra Fischer is a 67 y.o. female seen today in consultation requested by Felipa Eth, MD.  She complains of nasal congestion, rhinorrhea, sneezing, postnasal drainage, nasal pruritus, ocular pruritus, and sinus pressure.  No significant seasonal symptom variation has been noted nor have specific environmental triggers been identified.  She currently takes cetirizine, fluticasone nasal spray, and montelukast in an attempt to control the symptoms.  She is interested in the possibility of starting aeroallergen immunotherapy to reduce symptoms and decrease medication requirement. Sandra Fischer reports that she was diagnosed with asthma approximately 30 years ago.  Currently takes Symbicort 160-4.5 g, 2 inhalations via spacer device twice daily, Spiriva 18 g daily, and montelukast 10 mg daily at bedtime.  She reports that her asthma is well controlled on this regimen, however she did develop bronchitis during mid January. She reports that she was 3 years ago, she consumed fashion within, generalized urticaria, dyspnea.  Took albuterol and diphenhydramine and her symptoms gradually resolved without further medical intervention.  She has avoided fish since that time, however once a refill for her epinephrine autoinjector.   Assessment and plan: Perennial and seasonal allergic rhinitis  Aeroallergen avoidance measures have been discussed and provided in written form.  A prescription has been provided for azelastine nasal spray, 1-2 sprays per nostril 2 times daily as needed. Proper nasal spray technique has been discussed and demonstrated.   Nasal saline spray (i.e., Simply Saline) or nasal saline lavage (i.e., NeilMed)  is recommended as needed and prior to medicated nasal sprays.  A prescription has been provided for fexofenadine (Allegra) 180 mg daily if needed.  The risks and benefits of aeroallergen immunotherapy have been discussed. The patients mother is motivated to initiate immunotherapy to reduce symptoms and decrease medication requirement. Informed consent has been signed and allergen vaccine orders have been submitted. Medications will be decreased or discontinued as symptom relief from immunotherapy becomes evident.  Allergic conjunctivitis  Treatment plan as outlined above for allergic rhinitis.  A prescription has been provided for Patanol, one drop per eye twice daily as needed.  I have also recommended eye lubricant drops (i.e., Natural Tears) as needed.  Moderate persistent asthma Currently stable.  Continue Symbicort 160-4.5 g, 2 inhalations via spacer device twice daily, Spiriva 18 g daily, montelukast 10 mg daily, and albuterol every 6 hours if needed.  Subjective and objective measures of pulmonary function will be followed and the treatment plan will be adjusted accordingly.  History of food allergy Possible food allergy.  The patients history suggests fish allergy, though todays skin tests were negative despite a positive histamine control.  Food allergen skin testing has excellent negative predictive value however there is still a 5% chance that the allergy exists.  Therefore, we will investigate further with serum specific IgE levels and, if negative, open graded oral challenge.  A laboratory order form has been provided for serum specific IgE against fish panel shellfish panel, alpha gal panel, as well as a serum tryptase level.  Until the food allergy has been definitively ruled out, the patient is to continue meticulous avoidance and have access to epinephrine autoinjector 2 pack.   Meds ordered this encounter  Medications  . azelastine (ASTELIN) 0.1 % nasal spray  Sig: Place 2 sprays into both nostrils 2 (two) times daily.    Dispense:  30 mL    Refill:  5  . olopatadine (PATANOL) 0.1 % ophthalmic solution    Sig: Place 1 drop into both eyes 2 (two) times daily.    Dispense:  5 mL    Refill:  5    Diagnostics: Spirometry: FVC is 1.79 L and FEV1 is 1.42 L (74% predicted) without significant postbronchodilator improvement.  This study was performed while the patient was asymptomatic.  Please see scanned spirometry results for details. Epicutaneous testing: Positive to weed pollen, ragweed pollen, tree pollen, molds cat hair, dog epithelia, and dust mite antigen. Intradermal testing: Positive to grass pollen. Food allergen skin testing: Negative to fish and shellfish.    Physical examination: Blood pressure 108/76, pulse 66, height 5\' 2"  (1.575 m), weight 184 lb 6.4 oz (83.6 kg), SpO2 96 %.  General: Alert, interactive, in no acute distress. HEENT: TMs pearly gray, turbinates edematous with thick discharge, post-pharynx moderately erythematous. Neck: Supple without lymphadenopathy. Lungs: Clear to auscultation without wheezing, rhonchi or rales. CV: Normal S1, S2 without murmurs. Abdomen: Nondistended, nontender. Skin: Warm and dry, without lesions or rashes. Extremities:  No clubbing, cyanosis or edema. Neuro:   Grossly intact.  Review of systems:  Review of systems negative except as noted in HPI / PMHx or noted below: Review of Systems  Constitutional: Negative.   HENT: Negative.   Eyes: Negative.   Respiratory: Negative.   Cardiovascular: Negative.   Gastrointestinal: Negative.   Genitourinary: Negative.   Musculoskeletal: Negative.   Skin: Negative.   Neurological: Negative.   Endo/Heme/Allergies: Negative.   Psychiatric/Behavioral: Negative.     Past medical history:  Past Medical History:  Diagnosis Date  . Asthma   . Hypertension   . RA (rheumatoid arthritis) (HCC)     Past surgical history:  Past Surgical History:    Procedure Laterality Date  . CATARACT EXTRACTION    . CHOLECYSTECTOMY      Family history: Family History  Problem Relation Age of Onset  . Diabetes Mother   . Hypertension Mother   . Hyperlipidemia Mother     Social history: Social History   Socioeconomic History  . Marital status: Married    Spouse name: Not on file  . Number of children: Not on file  . Years of education: Not on file  . Highest education level: Not on file  Social Needs  . Financial resource strain: Not on file  . Food insecurity - worry: Not on file  . Food insecurity - inability: Not on file  . Transportation needs - medical: Not on file  . Transportation needs - non-medical: Not on file  Occupational History  . Not on file  Tobacco Use  . Smoking status: Never Smoker  . Smokeless tobacco: Never Used  Substance and Sexual Activity  . Alcohol use: No  . Drug use: No  . Sexual activity: Not on file  Other Topics Concern  . Not on file  Social History Narrative  . Not on file   Environmental History: The patient lives in a 65 year old house with hardwood floors throughout, gas heat, and central air.  There is no known mold/water damage in the home.  She is a non-smoker.  There are no pets in the home.  Allergies as of 02/09/2018      Reactions   Codeine Hives, Nausea And Vomiting   Latex Itching   Penicillins Nausea And Vomiting  Medication List        Accurate as of 02/09/18  5:26 PM. Always use your most recent med list.          Abatacept 125 MG/ML Soaj Take 750 mg by mouth.   albuterol 1.25 MG/3ML nebulizer solution Commonly known as:  ACCUNEB Take 1 ampule by nebulization every 6 (six) hours as needed for wheezing.   albuterol 108 (90 Base) MCG/ACT inhaler Commonly known as:  PROVENTIL HFA;VENTOLIN HFA Inhale 2 puffs into the lungs every 6 (six) hours as needed for wheezing or shortness of breath.   aspirin EC 81 MG tablet Take 81 mg by mouth daily.   azelastine 0.1  % nasal spray Commonly known as:  ASTELIN Place 2 sprays into both nostrils 2 (two) times daily.   budesonide-formoterol 160-4.5 MCG/ACT inhaler Commonly known as:  SYMBICORT Inhale 2 puffs into the lungs 2 (two) times daily.   cetirizine 10 MG tablet Commonly known as:  ZYRTEC Take 10 mg by mouth daily.   desloratadine 5 MG tablet Commonly known as:  CLARINEX Take 5 mg by mouth daily.   docusate sodium 50 MG capsule Commonly known as:  COLACE Take 50 mg by mouth 2 (two) times daily.   fluticasone 50 MCG/ACT nasal spray Commonly known as:  FLONASE Place into both nostrils daily.   guaiFENesin-dextromethorphan 100-10 MG/5ML syrup Commonly known as:  ROBITUSSIN DM Take 5 mLs by mouth every 4 (four) hours as needed for cough.   ipratropium 0.02 % nebulizer solution Commonly known as:  ATROVENT Take 0.5 mg by nebulization 4 (four) times daily.   leflunomide 20 MG tablet Commonly known as:  ARAVA Take 20 mg by mouth daily.   levothyroxine 88 MCG tablet Commonly known as:  SYNTHROID, LEVOTHROID Take 88 mcg by mouth daily before breakfast.   lisinopril 40 MG tablet Commonly known as:  PRINIVIL,ZESTRIL Take 40 mg by mouth daily.   montelukast 10 MG tablet Commonly known as:  SINGULAIR Take 10 mg by mouth at bedtime.   multivitamin-prenatal 27-0.8 MG Tabs tablet Take 1 tablet by mouth daily at 12 noon.   olopatadine 0.1 % ophthalmic solution Commonly known as:  PATANOL Place 1 drop into both eyes 2 (two) times daily.   omeprazole 10 MG capsule Commonly known as:  PRILOSEC Take 20 mg by mouth daily.   pravastatin 40 MG tablet Commonly known as:  PRAVACHOL Take 40 mg by mouth daily.   tiotropium 18 MCG inhalation capsule Commonly known as:  SPIRIVA Place 18 mcg into inhaler and inhale daily.   traMADol 50 MG tablet Commonly known as:  ULTRAM Take 100 mg by mouth as needed (for rheumatoid arthritis).   verapamil 120 MG tablet Commonly known as:  CALAN Take  240 mg by mouth 2 (two) times daily.       Known medication allergies: Allergies  Allergen Reactions  . Codeine Hives and Nausea And Vomiting  . Latex Itching  . Penicillins Nausea And Vomiting    I appreciate the opportunity to take part in Pieper's care. Please do not hesitate to contact me with questions.  Sincerely,   R. Edgar Frisk, MD

## 2018-02-10 ENCOUNTER — Telehealth: Payer: Self-pay

## 2018-02-10 DIAGNOSIS — T7840XA Allergy, unspecified, initial encounter: Secondary | ICD-10-CM

## 2018-02-10 NOTE — Progress Notes (Signed)
VIALS EXP 02-11-19

## 2018-02-10 NOTE — Telephone Encounter (Signed)
Sandra Fischer, has this been taken care of?

## 2018-02-10 NOTE — Telephone Encounter (Signed)
Informed pt of labs being ordered and she will try and make it next week

## 2018-02-10 NOTE — Telephone Encounter (Signed)
        Please call the patient to tell her we need to check some labs regarding the allergic reaction she had a few years ago.  Please submit a laboratory order for serum specific IgE against fish panel shellfish panel, alpha gal panel, as well as a serum tryptase level.  Thanks

## 2018-02-12 ENCOUNTER — Telehealth: Payer: Self-pay

## 2018-02-12 NOTE — Telephone Encounter (Signed)
Patient spoke to [place] from [facility] office and was told to come in for labs.

## 2018-02-12 NOTE — Telephone Encounter (Signed)
Spoke with pt she stated she had some itching still on her back from scratch testing. I told her to try hydrocortisone cream on her back and to take benadryl and if not better by first of next week to call us back

## 2018-02-17 DIAGNOSIS — J301 Allergic rhinitis due to pollen: Secondary | ICD-10-CM | POA: Diagnosis not present

## 2018-02-18 DIAGNOSIS — J3089 Other allergic rhinitis: Secondary | ICD-10-CM | POA: Diagnosis not present

## 2018-03-02 ENCOUNTER — Ambulatory Visit: Payer: Non-veteran care

## 2018-03-15 ENCOUNTER — Ambulatory Visit: Payer: Non-veteran care

## 2018-05-10 ENCOUNTER — Ambulatory Visit (INDEPENDENT_AMBULATORY_CARE_PROVIDER_SITE_OTHER): Payer: Non-veteran care | Admitting: Allergy and Immunology

## 2018-05-10 ENCOUNTER — Encounter: Payer: Self-pay | Admitting: Allergy and Immunology

## 2018-05-10 VITALS — BP 132/72 | HR 76 | Resp 20

## 2018-05-10 DIAGNOSIS — Z91018 Allergy to other foods: Secondary | ICD-10-CM

## 2018-05-10 DIAGNOSIS — J3089 Other allergic rhinitis: Secondary | ICD-10-CM

## 2018-05-10 DIAGNOSIS — J4540 Moderate persistent asthma, uncomplicated: Secondary | ICD-10-CM | POA: Diagnosis not present

## 2018-05-10 DIAGNOSIS — J309 Allergic rhinitis, unspecified: Secondary | ICD-10-CM

## 2018-05-10 MED ORDER — EPINEPHRINE 0.3 MG/0.3ML IJ SOAJ: 0.3000 mg | Freq: Once | INTRAMUSCULAR | 2 refills | Status: AC

## 2018-05-10 NOTE — Progress Notes (Signed)
Follow-up Note  RE: Sandra Fischer MRN: [id number] DOB: May 27, 1951 Date of Office Visit: 05/10/2018  Primary care provider: Felipa Eth, MD Referring provider: Felipa Eth, MD  History of present illness: Sandra Fischer is a 67 y.o. female with persistent asthma allergic rhinoconjunctivitis, and history of food allergy presenting today for follow-up.  She is previously seen in this clinic for her initial evaluation on February 09, 2018.  She reports that she has noted some improvement regarding upper and lower respiratory symptoms in the interval since her previous visit.  She does note though that her air conditioning recently broken her home and the lack of AC in addition to the ceiling fans blowing on her have precipitated occasional asthma symptoms.  The patient is interested in the possibility of starting aeroallergen immunotherapy to reduce symptoms and decrease medication requirement.  She reports that she did not have her labs drawn as recommended after her initial visit.  She is interested in having the labs drawn to assess potential food allergy.  She has eliminated fish from her diet.  She needs a prescription for epinephrine autoinjectors.  Assessment and plan: Moderate persistent asthma  Continue Symbicort 160-4.5 g, 2 inhalations via spacer device twice daily, Spiriva 18 g daily, montelukast 10 mg daily, and albuterol every 6 hours if needed.  Subjective and objective measures of pulmonary function will be followed and the treatment plan will be adjusted accordingly.  Perennial and seasonal allergic rhinitis  Continue appropriate aeroallergen avoidance measures, azelastine nasal spray as needed, fexofenadine as needed, and nasal saline irrigation as needed.  Initiate immunotherapy.  History of food allergy Possible food allergy.  The patients history suggests fish allergy, though skin tests were negative despite a positive histamine control.  Food allergen skin  testing has excellent negative predictive value however there is still a 5% chance that the allergy exists.  Therefore, we will investigate further with serum specific IgE levels and, if negative, open graded oral challenge.  A laboratory order form has been provided for serum specific IgE against fish panel shellfish panel, alpha gal panel, as well as a serum tryptase level.  Until the food allergy has been definitively ruled out, the patient is to continue meticulous avoidance and have access to epinephrine autoinjector 2 pack.   Meds ordered this encounter  Medications  . EPINEPHrine (EPIPEN 2-PAK) 0.3 mg/0.3 mL IJ SOAJ injection    Sig: Inject 0.3 mLs (0.3 mg total) into the muscle once for 1 dose.    Dispense:  2 Device    Refill:  2    Diagnostics: Spirometry reveals an FVC of 1.62 L and an FEV1 of 1.25 L, FEV1 ratio of 97%.  Please see scanned spirometry results for details.    Physical examination: Blood pressure 132/72, pulse 76, resp. rate 20, SpO2 96 %.  General: Alert, interactive, in no acute distress. HEENT: TMs pearly gray, turbinates moderately edematous without discharge, post-pharynx erythematous. Neck: Supple without lymphadenopathy. Lungs: Mildly decreased breath sounds bilaterally without wheezing, rhonchi or rales. CV: Normal S1, S2 without murmurs. Skin: Warm and dry, without lesions or rashes.  The following portions of the patient's history were reviewed and updated as appropriate: allergies, current medications, past family history, past medical history, past social history, past surgical history and problem list.  Allergies as of 05/10/2018      Reactions   Codeine Hives, Nausea And Vomiting   Latex Itching   Penicillins Nausea And Vomiting      Medication List  Accurate as of 05/10/18  7:33 PM. Always use your most recent med list.          albuterol 1.25 MG/3ML nebulizer solution Commonly known as:  ACCUNEB Take 1 ampule by nebulization  every 6 (six) hours as needed for wheezing.   albuterol 108 (90 Base) MCG/ACT inhaler Commonly known as:  PROVENTIL HFA;VENTOLIN HFA Inhale 2 puffs into the lungs every 6 (six) hours as needed for wheezing or shortness of breath.   aspirin EC 81 MG tablet Take 81 mg by mouth daily.   azelastine 0.1 % nasal spray Commonly known as:  ASTELIN Place 2 sprays into both nostrils 2 (two) times daily.   budesonide-formoterol 160-4.5 MCG/ACT inhaler Commonly known as:  SYMBICORT Inhale 2 puffs into the lungs 2 (two) times daily.   cetirizine 10 MG tablet Commonly known as:  ZYRTEC Take 10 mg by mouth daily.   desloratadine 5 MG tablet Commonly known as:  CLARINEX Take 5 mg by mouth daily.   docusate sodium 50 MG capsule Commonly known as:  COLACE Take 50 mg by mouth 2 (two) times daily.   EPINEPHrine 0.3 mg/0.3 mL Soaj injection Commonly known as:  EPIPEN 2-PAK Inject 0.3 mLs (0.3 mg total) into the muscle once for 1 dose.   fluticasone 50 MCG/ACT nasal spray Commonly known as:  FLONASE Place into both nostrils daily.   guaiFENesin-dextromethorphan 100-10 MG/5ML syrup Commonly known as:  ROBITUSSIN DM Take 5 mLs by mouth every 4 (four) hours as needed for cough.   ipratropium 0.02 % nebulizer solution Commonly known as:  ATROVENT Take 0.5 mg by nebulization 4 (four) times daily.   leflunomide 20 MG tablet Commonly known as:  ARAVA Take 20 mg by mouth daily.   levothyroxine 88 MCG tablet Commonly known as:  SYNTHROID, LEVOTHROID Take 88 mcg by mouth daily before breakfast.   lisinopril 40 MG tablet Commonly known as:  PRINIVIL,ZESTRIL Take 40 mg by mouth daily.   montelukast 10 MG tablet Commonly known as:  SINGULAIR Take 10 mg by mouth at bedtime.   multivitamin-prenatal 27-0.8 MG Tabs tablet Take 1 tablet by mouth daily at 12 noon.   olopatadine 0.1 % ophthalmic solution Commonly known as:  PATANOL Place 1 drop into both eyes 2 (two) times daily.     omeprazole 10 MG capsule Commonly known as:  PRILOSEC Take 20 mg by mouth daily.   pravastatin 40 MG tablet Commonly known as:  PRAVACHOL Take 40 mg by mouth daily.   tiotropium 18 MCG inhalation capsule Commonly known as:  SPIRIVA Place 18 mcg into inhaler and inhale daily.   traMADol 50 MG tablet Commonly known as:  ULTRAM Take 100 mg by mouth as needed (for rheumatoid arthritis).   verapamil 120 MG tablet Commonly known as:  CALAN Take 240 mg by mouth 2 (two) times daily.       Allergies  Allergen Reactions  . Codeine Hives and Nausea And Vomiting  . Latex Itching  . Penicillins Nausea And Vomiting   Review of systems: Review of systems negative except as noted in HPI / PMHx or noted below: Constitutional: Negative.  HENT: Negative.   Eyes: Negative.  Respiratory: Negative.   Cardiovascular: Negative.  Gastrointestinal: Negative.  Genitourinary: Negative.  Musculoskeletal: Negative.  Neurological: Negative.  Endo/Heme/Allergies: Negative.  Cutaneous: Negative.  Past Medical History:  Diagnosis Date  . Asthma   . Hypertension   . RA (rheumatoid arthritis) ([place])     Family History  Problem Relation Age  of Onset  . Diabetes Mother   . Hypertension Mother   . Hyperlipidemia Mother     Social History   Socioeconomic History  . Marital status: Married    Spouse name: Not on file  . Number of children: Not on file  . Years of education: Not on file  . Highest education level: Not on file  Occupational History  . Not on file  Social Needs  . Financial resource strain: Not on file  . Food insecurity:    Worry: Not on file    Inability: Not on file  . Transportation needs:    Medical: Not on file    Non-medical: Not on file  Tobacco Use  . Smoking status: Never Smoker  . Smokeless tobacco: Never Used  Substance and Sexual Activity  . Alcohol use: No  . Drug use: No  . Sexual activity: Not on file  Lifestyle  . Physical activity:    Days  per week: Not on file    Minutes per session: Not on file  . Stress: Not on file  Relationships  . Social connections:    Talks on phone: Not on file    Gets together: Not on file    Attends religious service: Not on file    Active member of club or organization: Not on file    Attends meetings of clubs or organizations: Not on file    Relationship status: Not on file  . Intimate partner violence:    Fear of current or ex partner: Not on file    Emotionally abused: Not on file    Physically abused: Not on file    Forced sexual activity: Not on file  Other Topics Concern  . Not on file  Social History Narrative  . Not on file    I appreciate the opportunity to take part in Haydon's care. Please do not hesitate to contact me with questions.  Sincerely,   R. Edgar Frisk, MD

## 2018-05-10 NOTE — Patient Instructions (Signed)
Moderate persistent asthma  Continue Symbicort 160-4.5 g, 2 inhalations via spacer device twice daily, Spiriva 18 g daily, montelukast 10 mg daily, and albuterol every 6 hours if needed.  Subjective and objective measures of pulmonary function will be followed and the treatment plan will be adjusted accordingly.  Perennial and seasonal allergic rhinitis  Continue appropriate aeroallergen avoidance measures, azelastine nasal spray as needed, fexofenadine as needed, and nasal saline irrigation as needed.  Initiate immunotherapy.  History of food allergy Possible food allergy.  The patients history suggests fish allergy, though skin tests were negative despite a positive histamine control.  Food allergen skin testing has excellent negative predictive value however there is still a 5% chance that the allergy exists.  Therefore, we will investigate further with serum specific IgE levels and, if negative, open graded oral challenge.  A laboratory order form has been provided for serum specific IgE against fish panel shellfish panel, alpha gal panel, as well as a serum tryptase level.  Until the food allergy has been definitively ruled out, the patient is to continue meticulous avoidance and have access to epinephrine autoinjector 2 pack.   Return in about 4 months (around 09/10/2018), or if symptoms worsen or fail to improve.

## 2018-05-10 NOTE — Assessment & Plan Note (Signed)
   Continue appropriate aeroallergen avoidance measures, azelastine nasal spray as needed, fexofenadine as needed, and nasal saline irrigation as needed.  Initiate immunotherapy.

## 2018-05-10 NOTE — Assessment & Plan Note (Signed)
Possible food allergy.  The patients history suggests fish allergy, though skin tests were negative despite a positive histamine control.  Food allergen skin testing has excellent negative predictive value however there is still a 5% chance that the allergy exists.  Therefore, we will investigate further with serum specific IgE levels and, if negative, open graded oral challenge.  A laboratory order form has been provided for serum specific IgE against fish panel shellfish panel, alpha gal panel, as well as a serum tryptase level.  Until the food allergy has been definitively ruled out, the patient is to continue meticulous avoidance and have access to epinephrine autoinjector 2 pack.

## 2018-05-10 NOTE — Assessment & Plan Note (Signed)
   Continue Symbicort 160-4.5 g, 2 inhalations via spacer device twice daily, Spiriva 18 g daily, montelukast 10 mg daily, and albuterol every 6 hours if needed.  Subjective and objective measures of pulmonary function will be followed and the treatment plan will be adjusted accordingly.

## 2018-05-12 ENCOUNTER — Telehealth: Payer: Self-pay | Admitting: Allergy and Immunology

## 2018-05-12 MED ORDER — EPINEPHRINE 0.3 MG/0.3ML IJ SOAJ: 0.3000 mg | Freq: Once | INTRAMUSCULAR | 1 refills | Status: DC

## 2018-05-12 MED ORDER — EPINEPHRINE 0.3 MG/0.3ML IJ SOAJ: INTRAMUSCULAR | 1 refills | Status: DC

## 2018-05-12 NOTE — Telephone Encounter (Signed)
Patient is aware 

## 2018-05-12 NOTE — Telephone Encounter (Addendum)
Dr. Verlin Fester states he did not receive faxed Rx.  Reprinted rx for generic Epi Pen 0.3 mg.  Dr. Verlin Fester signed rx.  Faxed to [facility] at [phone]. Fax sent and confirmed.

## 2018-05-12 NOTE — Addendum Note (Signed)
Addended byOralia Rud M on: 05/12/2018 04:43 PM   Modules accepted: Orders

## 2018-05-12 NOTE — Addendum Note (Signed)
Addended by: Herbie Drape on: 05/12/2018 08:17 AM   Modules accepted: Orders

## 2018-05-12 NOTE — Telephone Encounter (Signed)
Pt called and said that [facility] [phone].

## 2018-05-12 NOTE — Telephone Encounter (Signed)
Prescription has been printed and faxed to [facility] to be signed by Dr. Verlin Fester. I also included the [facility] fax number so one of the girls at that office can go ahead and fax it to the [facility]

## 2018-05-12 NOTE — Telephone Encounter (Signed)
Was this faxed to the [facility]?

## 2018-05-16 LAB — ALPHA-GAL PANEL
Alpha Gal IgE*: <0.1 kU/L (ref <0.10)
Beef (Bos spp) IgE: <0.1 kU/L (ref <0.35)
Class Interpretation: 0
Class Interpretation: 0
Class Interpretation: 0
Lamb/Mutton (Ovis spp) IgE: <0.1 kU/L (ref <0.35)
Pork (Sus spp) IgE: <0.1 kU/L (ref <0.35)

## 2018-05-16 LAB — ALLERGEN PROFILE, SHELLFISH
Clam IgE: <0.1 kU/L
F023-IgE Crab: <0.1 kU/L
F080-IgE Lobster: <0.1 kU/L
F290-IgE Oyster: <0.1 kU/L
Scallop IgE: <0.1 kU/L
Shrimp IgE: <0.1 kU/L

## 2018-05-16 LAB — ALLERGEN PROFILE, FOOD-FISH
Allergen Mackerel IgE: <0.1 kU/L
Allergen Salmon IgE: <0.1 kU/L
Allergen Trout IgE: <0.1 kU/L
Allergen Walley Pike IgE: <0.1 kU/L
Codfish IgE: <0.1 kU/L
Halibut IgE: <0.1 kU/L
Tuna: <0.1 kU/L

## 2018-05-16 LAB — TRYPTASE: Tryptase: 2.4 ug/L (ref 2.2–13.2)

## 2018-06-17 ENCOUNTER — Encounter: Payer: Self-pay | Admitting: Obstetrics and Gynecology

## 2018-06-17 ENCOUNTER — Ambulatory Visit: Payer: Self-pay | Admitting: Obstetrics and Gynecology

## 2018-06-17 VITALS — BP 162/103 | HR 88 | Ht 62.0 in | Wt 181.1 lb

## 2018-06-17 DIAGNOSIS — N898 Other specified noninflammatory disorders of vagina: Secondary | ICD-10-CM

## 2018-06-17 LAB — POCT URINALYSIS DIPSTICK
Blood, UA: NEGATIVE
Glucose, UA: NEGATIVE
Ketones, UA: NEGATIVE
Leukocytes, UA: NEGATIVE
Nitrite, UA: NEGATIVE
Odor: NEGATIVE
Protein, UA: POSITIVE — AB
Spec Grav, UA: >=1.03 — AB (ref 1.010–1.025)
Urobilinogen, UA: 0.2 E.U./dL
pH, UA: 6 (ref 5.0–8.0)

## 2018-06-17 NOTE — Addendum Note (Signed)
Addended by: Elouise Munroe on: 06/17/2018 12:09 PM   Modules accepted: Orders

## 2018-06-17 NOTE — Progress Notes (Signed)
HPI:      Sandra Fischer is a 67 y.o. G2P0020 who LMP was No LMP recorded. Patient is postmenopausal.  Subjective:   She presents today with complaint of intermittent "offensive" vaginal odor for several months.  She also complains of occasional vaginal itching.  She has been treated on numerous occasions in the last few months with Diflucan MetroGel and clindamycin to name some.  She reports that this has not helped.  She has also been started on Valtrex suppression for a remote history of herpes.  The patient reports that she has not had an outbreak in many years and is interested in discontinuing Valtrex unless it is "necessary".  She reports that she has not had a Pap smear in many years because she was told that she was too old and did not need them any longer. She reports no history of recent vaginal bleeding. She does not have pain with intercourse.    Hx: The following portions of the patient's history were reviewed and updated as appropriate:             She  has a past medical history of Asthma, Asthma, Environmental allergies, High cholesterol, Hypertension, RA (rheumatoid arthritis) ([place]), and Thyroid disease. She does not have any pertinent problems on file. She  has a past surgical history that includes Cholecystectomy; Cataract extraction; and Thyroidectomy, partial. Her family history includes Diabetes in her mother; Hyperlipidemia in her mother; Hypertension in her mother. She  reports that she has never smoked. She has never used smokeless tobacco. She reports that she does not drink alcohol or use drugs. She has a current medication list which includes the following prescription(s): albuterol, albuterol, aspirin ec, azelastine, budesonide-formoterol, cetirizine, desloratadine, docusate sodium, epinephrine, fluticasone, guaifenesin-dextromethorphan, ipratropium, leflunomide, levothyroxine, lisinopril, montelukast, olopatadine, omeprazole, pravastatin, prednisolone,  multivitamin-prenatal, tiotropium, tramadol, and verapamil. She is allergic to abatacept; codeine; shellfish allergy; latex; and penicillins.       Review of Systems:  Review of Systems  Constitutional: Denied constitutional symptoms, night sweats, recent illness, fatigue, fever, insomnia and weight loss.  Eyes: Denied eye symptoms, eye pain, photophobia, vision change and visual disturbance.  Ears/Nose/Throat/Neck: Denied ear, nose, throat or neck symptoms, hearing loss, nasal discharge, sinus congestion and sore throat.  Cardiovascular: Denied cardiovascular symptoms, arrhythmia, chest pain/pressure, edema, exercise intolerance, orthopnea and palpitations.  Respiratory: Denied pulmonary symptoms, asthma, pleuritic pain, productive sputum, cough, dyspnea and wheezing.  Gastrointestinal: Denied, gastro-esophageal reflux, melena, nausea and vomiting.  Genitourinary: See HPI for additional information.  Musculoskeletal: Denied musculoskeletal symptoms, stiffness, swelling, muscle weakness and myalgia.  Dermatologic: Denied dermatology symptoms, rash and scar.  Neurologic: Denied neurology symptoms, dizziness, headache, neck pain and syncope.  Psychiatric: Denied psychiatric symptoms, anxiety and depression.  Endocrine: Denied endocrine symptoms including hot flashes and night sweats.   Meds:   Current Outpatient Medications on File Prior to Visit  Medication Sig Dispense Refill  . albuterol (ACCUNEB) 1.25 MG/3ML nebulizer solution Take 1 ampule by nebulization every 6 (six) hours as needed for wheezing.    Marland Kitchen albuterol (PROVENTIL HFA;VENTOLIN HFA) 108 (90 Base) MCG/ACT inhaler Inhale 2 puffs into the lungs every 6 (six) hours as needed for wheezing or shortness of breath. 1 Inhaler 0  . aspirin EC 81 MG tablet Take 81 mg by mouth daily.    Marland Kitchen azelastine (ASTELIN) 0.1 % nasal spray Place 2 sprays into both nostrils 2 (two) times daily. 30 mL 5  . budesonide-formoterol (SYMBICORT) 160-4.5  MCG/ACT inhaler Inhale 2 puffs into  the lungs 2 (two) times daily.    . cetirizine (ZYRTEC) 10 MG tablet Take 10 mg by mouth daily.    Marland Kitchen desloratadine (CLARINEX) 5 MG tablet Take 5 mg by mouth daily.    Marland Kitchen docusate sodium (COLACE) 50 MG capsule Take 50 mg by mouth 2 (two) times daily.    Marland Kitchen EPINEPHrine 0.3 mg/0.3 mL IJ SOAJ injection Use as directed for severe allergic reaction. 2 Device 1  . fluticasone (FLONASE) 50 MCG/ACT nasal spray Place into both nostrils daily.    Marland Kitchen guaiFENesin-dextromethorphan (ROBITUSSIN DM) 100-10 MG/5ML syrup Take 5 mLs by mouth every 4 (four) hours as needed for cough. 118 mL 0  . ipratropium (ATROVENT) 0.02 % nebulizer solution Take 0.5 mg by nebulization 4 (four) times daily.    Marland Kitchen leflunomide (ARAVA) 20 MG tablet Take 20 mg by mouth daily.    Marland Kitchen levothyroxine (SYNTHROID, LEVOTHROID) 88 MCG tablet Take 88 mcg by mouth daily before breakfast.    . lisinopril (PRINIVIL,ZESTRIL) 40 MG tablet Take 40 mg by mouth daily.    . montelukast (SINGULAIR) 10 MG tablet Take 10 mg by mouth at bedtime.    Marland Kitchen olopatadine (PATANOL) 0.1 % ophthalmic solution Place 1 drop into both eyes 2 (two) times daily. 5 mL 5  . omeprazole (PRILOSEC) 10 MG capsule Take 20 mg by mouth daily.    . pravastatin (PRAVACHOL) 40 MG tablet Take 40 mg by mouth daily.    . prednisoLONE 5 MG TABS tablet Take by mouth.    . Prenatal Vit-Fe Fumarate-FA (MULTIVITAMIN-PRENATAL) 27-0.8 MG TABS tablet Take 1 tablet by mouth daily at 12 noon.    . tiotropium (SPIRIVA) 18 MCG inhalation capsule Place 18 mcg into inhaler and inhale daily.    . traMADol (ULTRAM) 50 MG tablet Take 100 mg by mouth as needed (for rheumatoid arthritis).    . verapamil (CALAN) 120 MG tablet Take 240 mg by mouth 2 (two) times daily.     No current facility-administered medications on file prior to visit.     Objective:     Vitals:   06/17/18 1054  BP: (!) 162/103  Pulse: 88              Physical examination   Pelvic:   Vulva:  Normal appearance.  No lesions.  Vagina: No lesions or abnormalities noted.  Support: Normal pelvic support.  Urethra No masses tenderness or scarring.  Meatus Normal size without lesions or prolapse.  Cervix: Normal appearance.  No lesions.  Anus: Normal exam.  No lesions.  Perineum: Normal exam.  No lesions.        Bimanual   Uterus: Normal size.  Non-tender.  Mobile.  AV.  Adnexae: No masses.  Non-tender to palpation.  Cul-de-sac: Negative for abnormality.   WET PREP: bacteria: trace, clue cells: present, KOH (yeast): negative, odor: absent and trichomoniasis: negative Ph:  < 4.5   Assessment:    G2P0020 Patient Active Problem List   Diagnosis Date Noted  . Perennial and seasonal allergic rhinitis 02/09/2018  . Moderate persistent asthma 02/09/2018  . Allergic conjunctivitis 02/09/2018  . History of food allergy 02/09/2018  . Allergic reaction 02/09/2018  . Asthma exacerbation 08/22/2016  . RA (rheumatoid arthritis) ([place]) 08/22/2016  . HTN (hypertension) 08/22/2016     1. Vaginal odor   2. Vaginal itching     No clear evidence of monilia vaginitis or BV.  Patient mostly concerned with vaginal odor.  No evidence of lichen sclerosus or any  vulvar abnormality.  Vagina looks completely healthy.  Cervix looks healthy without lesions.  UA performed and negative.     Plan:            1.  VG plus performed-we will base any treatment on these findings if necessary.  2.  Discussed Valtrex in detail-patient will stop.  3.  If everything negative I have advised her to present to the office when she has the odor.  4.  Possibly consider long-term treatment with vaginal suppositories. Orders No orders of the defined types were placed in this encounter.   No orders of the defined types were placed in this encounter.     F/U  Return for We will contact her with any abnormal test results, Pt to contact us if symptoms worsen. I spent 47 minutes involved in the care of this  patient of which greater than 50% was spent discussing patient's history of vaginal odor and discharge, multiple previous treatment regimens, previous testing performed, herpes history and use of Valtrex, Pap smear menstrual history and history of menopause, urinalysis, current state of her diet-controlled diabetes etc.  Finis Bud, M.D. 06/17/2018 11:53 AM

## 2018-06-21 LAB — NUSWAB VAGINITIS PLUS (VG+)
Candida albicans, NAA: NEGATIVE
Candida glabrata, NAA: NEGATIVE
Chlamydia trachomatis, NAA: NEGATIVE
Neisseria gonorrhoeae, NAA: NEGATIVE
Trich vag by NAA: NEGATIVE

## 2018-06-23 ENCOUNTER — Telehealth: Payer: Self-pay | Admitting: Obstetrics and Gynecology

## 2018-06-23 NOTE — Telephone Encounter (Signed)
Patient just picked up her Bactrim on the 24th as she was waiting on test results from Dr. Amalia Hailey; she needs to know if she still needs to take the antibiotic.  A good call back number is [phone]; it is a cell with voicemail per patient, please advise, thanks.

## 2018-06-24 ENCOUNTER — Telehealth: Payer: Self-pay | Admitting: Obstetrics and Gynecology

## 2018-06-24 NOTE — Telephone Encounter (Signed)
Pt aware nuswab neg. Pt states she will call back to schedule an appt when she notices the vaginal odor.

## 2018-06-24 NOTE — Telephone Encounter (Signed)
The patient called and stated that she is very upset due to her results not being reviewed multiple times after she has called to check on them. The patient was informed that they were ready. The patient did not disclose any other information. Please advise.

## 2018-06-30 ENCOUNTER — Ambulatory Visit: Payer: Self-pay | Admitting: Obstetrics and Gynecology

## 2018-06-30 ENCOUNTER — Encounter: Payer: Self-pay | Admitting: Obstetrics and Gynecology

## 2018-06-30 VITALS — BP 123/77 | HR 108 | Ht 62.0 in | Wt 178.4 lb

## 2018-06-30 DIAGNOSIS — N898 Other specified noninflammatory disorders of vagina: Secondary | ICD-10-CM

## 2018-06-30 DIAGNOSIS — R399 Unspecified symptoms and signs involving the genitourinary system: Secondary | ICD-10-CM

## 2018-06-30 LAB — POCT URINALYSIS DIPSTICK
Bilirubin, UA: NEGATIVE
Blood, UA: NEGATIVE
Glucose, UA: NEGATIVE
Ketones, UA: NEGATIVE
Leukocytes, UA: NEGATIVE
Nitrite, UA: NEGATIVE
Protein, UA: POSITIVE — AB
Spec Grav, UA: >=1.03 — AB (ref 1.010–1.025)
Urobilinogen, UA: 0.2 E.U./dL
pH, UA: 6 (ref 5.0–8.0)

## 2018-06-30 MED ORDER — BORIC ACID CRYS: 600.0000 mg | CRYSTALS | Freq: Every day | 0 refills | Status: AC

## 2018-06-30 NOTE — Progress Notes (Signed)
Pt stated that her symptoms are getting worsen.

## 2018-06-30 NOTE — Progress Notes (Signed)
HPI:      [name] is a 67 y.o. G2P0020 who LMP was No LMP recorded. Patient is postmenopausal.  Subjective:   She presents today complaining of worsening vaginal odor.  Denies vaginal discharge.    Hx: The following portions of the patient's history were reviewed and updated as appropriate:             She  has a past medical history of Asthma, Asthma, Environmental allergies, High cholesterol, Hypertension, RA (rheumatoid arthritis) ([place]), and Thyroid disease. She does not have any pertinent problems on file. She  has a past surgical history that includes Cholecystectomy; Cataract extraction; and Thyroidectomy, partial. Her family history includes Diabetes in her mother; Hyperlipidemia in her mother; Hypertension in her mother. She  reports that she has never smoked. She has never used smokeless tobacco. She reports that she does not drink alcohol or use drugs. She has a current medication list which includes the following prescription(s): albuterol, albuterol, aspirin ec, azelastine, budesonide-formoterol, cetirizine, desloratadine, docusate sodium, epinephrine, fluticasone, guaifenesin-dextromethorphan, ipratropium, leflunomide, levothyroxine, lisinopril, montelukast, olopatadine, omeprazole, pravastatin, prednisolone, multivitamin-prenatal, tiotropium, tramadol, verapamil, and boric acid. She is allergic to abatacept; codeine; shellfish allergy; latex; and penicillins.       Review of Systems:  Review of Systems  Constitutional: Denied constitutional symptoms, night sweats, recent illness, fatigue, fever, insomnia and weight loss.  Eyes: Denied eye symptoms, eye pain, photophobia, vision change and visual disturbance.  Ears/Nose/Throat/Neck: Denied ear, nose, throat or neck symptoms, hearing loss, nasal discharge, sinus congestion and sore throat.  Cardiovascular: Denied cardiovascular symptoms, arrhythmia, chest pain/pressure, edema, exercise intolerance, orthopnea and  palpitations.  Respiratory: Denied pulmonary symptoms, asthma, pleuritic pain, productive sputum, cough, dyspnea and wheezing.  Gastrointestinal: Denied, gastro-esophageal reflux, melena, nausea and vomiting.  Genitourinary: See HPI for additional information.  Musculoskeletal: Denied musculoskeletal symptoms, stiffness, swelling, muscle weakness and myalgia.  Dermatologic: Denied dermatology symptoms, rash and scar.  Neurologic: Denied neurology symptoms, dizziness, headache, neck pain and syncope.  Psychiatric: Denied psychiatric symptoms, anxiety and depression.  Endocrine: Denied endocrine symptoms including hot flashes and night sweats.   Meds:   Current Outpatient Medications on File Prior to Visit  Medication Sig Dispense Refill  . albuterol (ACCUNEB) 1.25 MG/3ML nebulizer solution Take 1 ampule by nebulization every 6 (six) hours as needed for wheezing.    Marland Kitchen albuterol (PROVENTIL HFA;VENTOLIN HFA) 108 (90 Base) MCG/ACT inhaler Inhale 2 puffs into the lungs every 6 (six) hours as needed for wheezing or shortness of breath. 1 Inhaler 0  . aspirin EC 81 MG tablet Take 81 mg by mouth daily.    Marland Kitchen azelastine (ASTELIN) 0.1 % nasal spray Place 2 sprays into both nostrils 2 (two) times daily. 30 mL 5  . budesonide-formoterol (SYMBICORT) 160-4.5 MCG/ACT inhaler Inhale 2 puffs into the lungs 2 (two) times daily.    . cetirizine (ZYRTEC) 10 MG tablet Take 10 mg by mouth daily.    Marland Kitchen desloratadine (CLARINEX) 5 MG tablet Take 5 mg by mouth daily.    Marland Kitchen docusate sodium (COLACE) 50 MG capsule Take 50 mg by mouth 2 (two) times daily.    Marland Kitchen EPINEPHrine 0.3 mg/0.3 mL IJ SOAJ injection Use as directed for severe allergic reaction. 2 Device 1  . fluticasone (FLONASE) 50 MCG/ACT nasal spray Place into both nostrils daily.    Marland Kitchen guaiFENesin-dextromethorphan (ROBITUSSIN DM) 100-10 MG/5ML syrup Take 5 mLs by mouth every 4 (four) hours as needed for cough. 118 mL 0  . ipratropium (ATROVENT) 0.02 %  nebulizer  solution Take 0.5 mg by nebulization 4 (four) times daily.    Marland Kitchen leflunomide (ARAVA) 20 MG tablet Take 20 mg by mouth daily.    Marland Kitchen levothyroxine (SYNTHROID, LEVOTHROID) 88 MCG tablet Take 88 mcg by mouth daily before breakfast.    . lisinopril (PRINIVIL,ZESTRIL) 40 MG tablet Take 40 mg by mouth daily.    . montelukast (SINGULAIR) 10 MG tablet Take 10 mg by mouth at bedtime.    Marland Kitchen olopatadine (PATANOL) 0.1 % ophthalmic solution Place 1 drop into both eyes 2 (two) times daily. 5 mL 5  . omeprazole (PRILOSEC) 10 MG capsule Take 20 mg by mouth daily.    . pravastatin (PRAVACHOL) 40 MG tablet Take 40 mg by mouth daily.    . prednisoLONE 5 MG TABS tablet Take by mouth.    . Prenatal Vit-Fe Fumarate-FA (MULTIVITAMIN-PRENATAL) 27-0.8 MG TABS tablet Take 1 tablet by mouth daily at 12 noon.    . tiotropium (SPIRIVA) 18 MCG inhalation capsule Place 18 mcg into inhaler and inhale daily.    . traMADol (ULTRAM) 50 MG tablet Take 100 mg by mouth as needed (for rheumatoid arthritis).    . verapamil (CALAN) 120 MG tablet Take 240 mg by mouth 2 (two) times daily.     No current facility-administered medications on file prior to visit.     Objective:     Vitals:   06/30/18 1008  BP: 123/77  Pulse: (!) 108              Physical examination   Pelvic:   Vulva: Normal appearance.  No lesions.  Vagina: No lesions or abnormalities noted.  Support: Normal pelvic support.  Urethra No masses tenderness or scarring.  Meatus Normal size without lesions or prolapse.     Anus: Normal exam.  No lesions.  Perineum: Normal exam.  No lesions.   No vaginal or vulvar lesions noted-minimal vaginal discharge   WET PREP: clue cells: absent, KOH (yeast): negative, odor: absent and trichomoniasis: negative Ph:  < 4.5   Assessment:    G2P0020 Patient Active Problem List   Diagnosis Date Noted  . Perennial and seasonal allergic rhinitis 02/09/2018  . Moderate persistent asthma 02/09/2018  . Allergic conjunctivitis  02/09/2018  . History of food allergy 02/09/2018  . Allergic reaction 02/09/2018  . Asthma exacerbation 08/22/2016  . RA (rheumatoid arthritis) ([place]) 08/22/2016  . HTN (hypertension) 08/22/2016     1. UTI symptoms   2. Vaginal odor     No evidence of vaginitis or UTI   Plan:            1.  We will presumptively treat with  boric acid capsules in an attempt to maintain vaginal pH. Orders Orders Placed This Encounter  Procedures  . POCT urinalysis dipstick     Meds ordered this encounter  Medications  . Boric Acid CRYS    Sig: Place 600 mg vaginally at bedtime for 21 days. Then 2 times per week for 2 months    Dispense:  1 Bottle    Refill:  0    40 total capsules      F/U  Return in about 3 months (around 09/30/2018).  Finis Bud, M.D. 06/30/2018 10:56 AM

## 2018-08-18 ENCOUNTER — Encounter: Payer: Self-pay | Admitting: Emergency Medicine

## 2018-08-18 ENCOUNTER — Emergency Department: Payer: No Typology Code available for payment source

## 2018-08-18 ENCOUNTER — Other Ambulatory Visit: Payer: Self-pay

## 2018-08-18 ENCOUNTER — Emergency Department
Admission: EM | Admit: 2018-08-18 | Discharge: 2018-08-18 | Disposition: A | Discharge status: Home / Self Care | Payer: No Typology Code available for payment source | Attending: Student in an Organized Health Care Education/Training Program | Admitting: Student in an Organized Health Care Education/Training Program

## 2018-08-18 DIAGNOSIS — I10 Essential (primary) hypertension: Secondary | ICD-10-CM | POA: Diagnosis not present

## 2018-08-18 DIAGNOSIS — R2242 Localized swelling, mass and lump, left lower limb: Secondary | ICD-10-CM | POA: Diagnosis present

## 2018-08-18 DIAGNOSIS — M660 Rupture of popliteal cyst: Secondary | ICD-10-CM | POA: Diagnosis not present

## 2018-08-18 DIAGNOSIS — J45909 Unspecified asthma, uncomplicated: Secondary | ICD-10-CM | POA: Diagnosis not present

## 2018-08-18 DIAGNOSIS — M79605 Pain in left leg: Secondary | ICD-10-CM

## 2018-08-18 DIAGNOSIS — Z91040 Latex allergy status: Secondary | ICD-10-CM | POA: Insufficient documentation

## 2018-08-18 DIAGNOSIS — Z79899 Other long term (current) drug therapy: Secondary | ICD-10-CM | POA: Insufficient documentation

## 2018-08-18 MED ORDER — OXYCODONE HCL 5 MG PO TABS: 5.0000 mg | ORAL_TABLET | Freq: Three times a day (TID) | ORAL | 0 refills | Status: DC | PRN

## 2018-08-18 NOTE — ED Triage Notes (Signed)
Pt comes into the ED via POV c/o left calf pain and swelling.  Patient sent over to r/o DVT.  Patient states that this started 2 weeks ago and she was diagnosed with gout, but not all the symptoms have gone away.  Patient states the back of her calf is the worst area and it is more severe with movement.  Patient denies any long travels, but states she has RA.

## 2018-08-18 NOTE — ED Provider Notes (Signed)
[facility] Provider Note    First MD Initiated Contact with Patient 08/18/18 1506     (approximate)  I have reviewed the triage vital signs and the nursing notes.   HISTORY  Chief Complaint Leg Swelling    HPI [name] is a 67 y.o. female the ER for evaluation of left calf pain and swelling that started roughly a week ago.  Was previously treated with gout after being seen at the [facility] and those symptoms have resolved.  But patient is having persistent swelling and pain in the left calf.  Is never had issues with this in the past.  Denies any history of blood clots.  States the pain is mild to moderate and achy.  Denies any fevers or warmth in the area.  No trauma.  Is worsened by movement.  Past Medical History:  Diagnosis Date  . Asthma   . Asthma   . Environmental allergies   . High cholesterol   . Hypertension   . RA (rheumatoid arthritis) ([place])   . Thyroid disease    Family History  Problem Relation Age of Onset  . Diabetes Mother   . Hypertension Mother   . Hyperlipidemia Mother   . Breast cancer Neg Hx   . Ovarian cancer Neg Hx   . Colon cancer Neg Hx    Past Surgical History:  Procedure Laterality Date  . CATARACT EXTRACTION    . CHOLECYSTECTOMY    . THYROIDECTOMY, PARTIAL     Patient Active Problem List   Diagnosis Date Noted  . Perennial and seasonal allergic rhinitis 02/09/2018  . Moderate persistent asthma 02/09/2018  . Allergic conjunctivitis 02/09/2018  . History of food allergy 02/09/2018  . Allergic reaction 02/09/2018  . Asthma exacerbation 08/22/2016  . RA (rheumatoid arthritis) ([place]) 08/22/2016  . HTN (hypertension) 08/22/2016      Prior to Admission medications   Medication Sig Start Date End Date Taking? Authorizing Provider  albuterol (ACCUNEB) 1.25 MG/3ML nebulizer solution Take 1 ampule by nebulization every 6 (six) hours as needed for wheezing.    [provider]  albuterol  (PROVENTIL HFA;VENTOLIN HFA) 108 (90 Base) MCG/ACT inhaler Inhale 2 puffs into the lungs every 6 (six) hours as needed for wheezing or shortness of breath. 08/22/16   Nance Pear, MD  aspirin EC 81 MG tablet Take 81 mg by mouth daily.    [provider]  azelastine (ASTELIN) 0.1 % nasal spray Place 2 sprays into both nostrils 2 (two) times daily. 02/09/18   Bobbitt, [place] Muta, MD  budesonide-formoterol [facility]) 160-4.5 MCG/ACT inhaler Inhale 2 puffs into the lungs 2 (two) times daily.    [provider]  cetirizine (ZYRTEC) 10 MG tablet Take 10 mg by mouth daily.    [provider]  desloratadine (CLARINEX) 5 MG tablet Take 5 mg by mouth daily.    [provider]  docusate sodium (COLACE) 50 MG capsule Take 50 mg by mouth 2 (two) times daily.    [provider]  EPINEPHrine 0.3 mg/0.3 mL IJ SOAJ injection Use as directed for severe allergic reaction. 05/12/18   Bobbitt, [place] Muta, MD  fluticasone (FLONASE) 50 MCG/ACT nasal spray Place into both nostrils daily.    [provider]  guaiFENesin-dextromethorphan (ROBITUSSIN DM) 100-10 MG/5ML syrup Take 5 mLs by mouth every 4 (four) hours as needed for cough. 08/23/16   Dustin Flock, MD  ipratropium (ATROVENT) 0.02 % nebulizer solution Take 0.5 mg by nebulization 4 (  four) times daily.    [provider]  leflunomide (ARAVA) 20 MG tablet Take 20 mg by mouth daily.    [provider]  levothyroxine (SYNTHROID, LEVOTHROID) 88 MCG tablet Take 88 mcg by mouth daily before breakfast.    [provider]  lisinopril (PRINIVIL,ZESTRIL) 40 MG tablet Take 40 mg by mouth daily.    [provider]  montelukast (SINGULAIR) 10 MG tablet Take 10 mg by mouth at bedtime.    [provider]  olopatadine (PATANOL) 0.1 % ophthalmic solution Place 1 drop into both eyes 2 (two) times daily. 02/09/18   Bobbitt, [place] Muta, MD  omeprazole (PRILOSEC) 10 MG capsule Take  20 mg by mouth daily.    [provider]  oxyCODONE (ROXICODONE) 5 MG immediate release tablet Take 1 tablet (5 mg total) by mouth every 8 (eight) hours as needed. 08/18/18 08/18/19  Merlyn Lot, MD  pravastatin (PRAVACHOL) 40 MG tablet Take 40 mg by mouth daily.    [provider]  prednisoLONE 5 MG TABS tablet Take by mouth.    [provider]  Prenatal Vit-Fe Fumarate-FA (MULTIVITAMIN-PRENATAL) 27-0.8 MG TABS tablet Take 1 tablet by mouth daily at 12 noon.    [provider]  tiotropium (SPIRIVA) 18 MCG inhalation capsule Place 18 mcg into inhaler and inhale daily.    [provider]  traMADol (ULTRAM) 50 MG tablet Take 100 mg by mouth as needed (for rheumatoid arthritis).    [provider]  verapamil (CALAN) 120 MG tablet Take 240 mg by mouth 2 (two) times daily.    [provider]    Allergies Abatacept; Codeine; Shellfish allergy; Latex; and Penicillins    Social History Social History   Tobacco Use  . Smoking status: Never Smoker  . Smokeless tobacco: Never Used  Substance Use Topics  . Alcohol use: No  . Drug use: No    Review of Systems Patient denies headaches, rhinorrhea, blurry vision, numbness, shortness of breath, chest pain, edema, cough, abdominal pain, nausea, vomiting, diarrhea, dysuria, fevers, rashes or hallucinations unless otherwise stated above in HPI. ____________________________________________   PHYSICAL EXAM:  VITAL SIGNS: Vitals:   08/18/18 1308  BP: (!) 180/98  Pulse: 87  Resp: 16  Temp: 98.9 F (37.2 C)  SpO2: 99%    Constitutional: Alert and oriented. Well appearing and in no acute distress. Eyes: Conjunctivae are normal.  Head: Atraumatic. Nose: No congestion/rhinnorhea. Mouth/Throat: Mucous membranes are moist.   Neck: Painless ROM.  Cardiovascular:   Good peripheral circulation. Respiratory: Normal respiratory effort.  No retractions.  Gastrointestinal: Soft and  nontender.  Musculoskeletal: There is 1+ swelling to the left lower extremity.  No swelling the right.  Posterior calf is tender with no fluctuance or warmth.  No evidence of overlying cellulitis or erythema.  No joint effusions.  2+ DP and PT pulses Neurologic:  Normal speech and language. No gross focal neurologic deficits are appreciated.  Skin:  Skin is warm, dry and intact. No rash noted. Psychiatric: Mood and affect are normal. Speech and behavior are normal.  ____________________________________________   LABS (all labs ordered are listed, but only abnormal results are displayed)  No results found for this or any previous visit (from the past 24 hour(s)). ____________________________________________ ___________________________________  [id number]  I personally reviewed all radiographic images ordered to evaluate for the above acute complaints and reviewed radiology reports and findings.  These findings were personally discussed with the patient.  Please see medical record for radiology report.  ____________________________________________   PROCEDURES  Procedure(s) performed:  Procedures    Critical Care performed: no ____________________________________________   INITIAL IMPRESSION / ASSESSMENT AND PLAN / ED COURSE  Pertinent labs & imaging results that were available during my care of the patient were reviewed by me and considered in my medical decision making (see chart for details).  DDX: dvt, bakers cyst, hematoma, unlikely abscess or infection  Jalah Warmuth is a 67 y.o. who presents to the ED with left calf pain and swelling as described above.  She is afebrile Heema dynamically stable.  Ultrasound ordered for the above differential shows no evidence of DVT.  This not clinically consistent with abscess.  Unlikely hematoma she is not on any blood thinners without any trauma.  Most likely ruptured Baker's cyst.  Discussed conservative management.  Will give brief  course of pain medication she does have some discomfort from this and will give referral to orthopedics.  Discussed signs and symptoms for which she should return to the ER.      ____________________________________________   FINAL CLINICAL IMPRESSION(S) / ED DIAGNOSES  Final diagnoses:  Ruptured Bakers cyst  Left leg pain      NEW MEDICATIONS STARTED DURING THIS VISIT:  New Prescriptions   OXYCODONE (ROXICODONE) 5 MG IMMEDIATE RELEASE TABLET    Take 1 tablet (5 mg total) by mouth every 8 (eight) hours as needed.     Note:  This document was prepared using Dragon voice recognition software and may include unintentional dictation errors.     Merlyn Lot, MD 08/18/18 1539

## 2018-09-30 ENCOUNTER — Ambulatory Visit: Payer: Self-pay | Admitting: Obstetrics and Gynecology

## 2018-09-30 ENCOUNTER — Encounter: Payer: Self-pay | Admitting: Obstetrics and Gynecology

## 2018-09-30 VITALS — BP 140/88 | HR 101 | Ht 62.0 in | Wt 170.0 lb

## 2018-09-30 DIAGNOSIS — N898 Other specified noninflammatory disorders of vagina: Secondary | ICD-10-CM

## 2018-09-30 NOTE — Progress Notes (Signed)
HPI:      [name] is a 67 y.o. G2P0020 who LMP was No LMP recorded. Patient is postmenopausal.  Subjective:   She presents today for follow-up of vaginal odor.  She states that since using the boric acid she is "90% better".  She states that she still has an odor occasionally and she thinks this is often food dependent.  In other words certain foods seem to make it worse.  She is asymptomatic with itching burning etc.  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    Hx: The following portions of the patient's history were reviewed and updated as appropriate:             She  has a past medical history of Asthma, Asthma, Environmental allergies, High cholesterol, Hypertension, RA (rheumatoid arthritis) ([place]), and Thyroid disease. She does not have any pertinent problems on file. She  has a past surgical history that includes Cholecystectomy; Cataract extraction; and Thyroidectomy, partial. Her family history includes Diabetes in her mother; Hyperlipidemia in her mother; Hypertension in her mother. She  reports that she has never smoked. She has never used smokeless tobacco. She reports that she does not drink alcohol or use drugs. She has a current medication list which includes the following prescription(s): albuterol, albuterol, aspirin ec, azelastine, budesonide-formoterol, cetirizine, desloratadine, docusate sodium, epinephrine, fluticasone, guaifenesin-dextromethorphan, ipratropium, leflunomide, levothyroxine, lisinopril, montelukast, olopatadine, omeprazole, pravastatin, prednisolone, multivitamin-prenatal, tiotropium, tramadol, verapamil, and oxycodone. She is allergic to abatacept; codeine; shellfish allergy; latex; and penicillins.       Review of Systems:  Review of Systems  Constitutional: Denied constitutional symptoms, night sweats, recent illness, fatigue, fever, insomnia and weight loss.  Eyes: Denied eye symptoms, eye pain, photophobia, vision change and visual disturbance.   Ears/Nose/Throat/Neck: Denied ear, nose, throat or neck symptoms, hearing loss, nasal discharge, sinus congestion and sore throat.  Cardiovascular: Denied cardiovascular symptoms, arrhythmia, chest pain/pressure, edema, exercise intolerance, orthopnea and palpitations.  Respiratory: Denied pulmonary symptoms, asthma, pleuritic pain, productive sputum, cough, dyspnea and wheezing.  Gastrointestinal: Denied, gastro-esophageal reflux, melena, nausea and vomiting.  Genitourinary: See HPI for additional information.  Musculoskeletal: Denied musculoskeletal symptoms, stiffness, swelling, muscle weakness and myalgia.  Dermatologic: Denied dermatology symptoms, rash and scar.  Neurologic: Denied neurology symptoms, dizziness, headache, neck pain and syncope.  Psychiatric: Denied psychiatric symptoms, anxiety and depression.  Endocrine: Denied endocrine symptoms including hot flashes and night sweats.   Meds:   Current Outpatient Medications on File Prior to Visit  Medication Sig Dispense Refill  . albuterol (ACCUNEB) 1.25 MG/3ML nebulizer solution Take 1 ampule by nebulization every 6 (six) hours as needed for wheezing.    Marland Kitchen albuterol (PROVENTIL HFA;VENTOLIN HFA) 108 (90 Base) MCG/ACT inhaler Inhale 2 puffs into the lungs every 6 (six) hours as needed for wheezing or shortness of breath. 1 Inhaler 0  . aspirin EC 81 MG tablet Take 81 mg by mouth daily.    Marland Kitchen azelastine (ASTELIN) 0.1 % nasal spray Place 2 sprays into both nostrils 2 (two) times daily. 30 mL 5  . budesonide-formoterol (SYMBICORT) 160-4.5 MCG/ACT inhaler Inhale 2 puffs into the lungs 2 (two) times daily.    . cetirizine (ZYRTEC) 10 MG tablet Take 10 mg by mouth daily.    Marland Kitchen desloratadine (CLARINEX) 5 MG tablet Take 5 mg by mouth daily.    Marland Kitchen docusate sodium (COLACE) 50 MG capsule Take 50 mg by mouth 2 (two) times daily.    Marland Kitchen EPINEPHrine 0.3 mg/0.3 mL IJ SOAJ injection Use  as directed for severe allergic reaction. 2 Device 1  .  fluticasone (FLONASE) 50 MCG/ACT nasal spray Place into both nostrils daily.    Marland Kitchen guaiFENesin-dextromethorphan (ROBITUSSIN DM) 100-10 MG/5ML syrup Take 5 mLs by mouth every 4 (four) hours as needed for cough. 118 mL 0  . ipratropium (ATROVENT) 0.02 % nebulizer solution Take 0.5 mg by nebulization 4 (four) times daily.    Marland Kitchen leflunomide (ARAVA) 20 MG tablet Take 20 mg by mouth daily.    Marland Kitchen levothyroxine (SYNTHROID, LEVOTHROID) 88 MCG tablet Take 88 mcg by mouth daily before breakfast.    . lisinopril (PRINIVIL,ZESTRIL) 40 MG tablet Take 40 mg by mouth daily.    . montelukast (SINGULAIR) 10 MG tablet Take 10 mg by mouth at bedtime.    Marland Kitchen olopatadine (PATANOL) 0.1 % ophthalmic solution Place 1 drop into both eyes 2 (two) times daily. 5 mL 5  . omeprazole (PRILOSEC) 10 MG capsule Take 20 mg by mouth daily.    . pravastatin (PRAVACHOL) 40 MG tablet Take 40 mg by mouth daily.    . prednisoLONE 5 MG TABS tablet Take by mouth.    . Prenatal Vit-Fe Fumarate-FA (MULTIVITAMIN-PRENATAL) 27-0.8 MG TABS tablet Take 1 tablet by mouth daily at 12 noon.    . tiotropium (SPIRIVA) 18 MCG inhalation capsule Place 18 mcg into inhaler and inhale daily.    . traMADol (ULTRAM) 50 MG tablet Take 100 mg by mouth as needed (for rheumatoid arthritis).    . verapamil (CALAN) 120 MG tablet Take 240 mg by mouth 2 (two) times daily.    Marland Kitchen oxyCODONE (ROXICODONE) 5 MG immediate release tablet Take 1 tablet (5 mg total) by mouth every 8 (eight) hours as needed. (Patient not taking: Reported on 09/30/2018) 10 tablet 0   No current facility-administered medications on file prior to visit.     Objective:     Vitals:   09/30/18 1058  BP: 140/88  Pulse: (!) 101              Patient declines examination today.  Assessment:    G2P0020 Patient Active Problem List   Diagnosis Date Noted  . Perennial and seasonal allergic rhinitis 02/09/2018  . Moderate persistent asthma 02/09/2018  . Allergic conjunctivitis 02/09/2018  .  History of food allergy 02/09/2018  . Allergic reaction 02/09/2018  . Asthma exacerbation 08/22/2016  . RA (rheumatoid arthritis) ([place]) 08/22/2016  . HTN (hypertension) 08/22/2016     1. Vaginal odor     Mostly resolved with use of boric acid.  Patient has occasional odor with certain foods.  One consideration we discussed in some detail is a possibility of her urine changing based on dietary habits.  Then if she has any occasional urine leakage this may contribute to which she perceives as a vaginal odor.   Plan:            1.  Patient would like to continue expectant management at this time.  She does not desire any treatment.  She has requested a follow-up "check in" visit in 6 months. Orders No orders of the defined types were placed in this encounter.   No orders of the defined types were placed in this encounter.     F/U  Return in about 6 months (around 04/01/2019). I spent 26 minutes involved in the care of this patient of which greater than 50% was spent discussing her current assessment of vaginal odor, possible causes including urinary, dietary modification, use of boric acid, HSV.  Finis Bud, M.D. 09/30/2018 11:15 AM

## 2018-09-30 NOTE — Progress Notes (Signed)
Pt presents today for follow up. Pt states she still has vaginal odor.

## 2018-10-04 ENCOUNTER — Other Ambulatory Visit: Payer: Self-pay | Admitting: Critical Care Medicine

## 2018-10-04 ENCOUNTER — Other Ambulatory Visit: Payer: Self-pay

## 2018-10-04 DIAGNOSIS — I272 Pulmonary hypertension, unspecified: Secondary | ICD-10-CM

## 2018-10-14 ENCOUNTER — Ambulatory Visit (HOSPITAL_BASED_OUTPATIENT_CLINIC_OR_DEPARTMENT_OTHER)
Admission: RE | Admit: 2018-10-14 | Discharge: 2018-10-14 | Disposition: A | Discharge status: Home / Self Care | Payer: Non-veteran care | Source: Ambulatory Visit | Attending: Critical Care Medicine | Admitting: Critical Care Medicine

## 2018-10-14 ENCOUNTER — Other Ambulatory Visit: Payer: Self-pay

## 2018-10-14 ENCOUNTER — Emergency Department: Payer: Non-veteran care

## 2018-10-14 ENCOUNTER — Encounter: Payer: Self-pay | Admitting: Emergency Medicine

## 2018-10-14 ENCOUNTER — Emergency Department
Admission: EM | Admit: 2018-10-14 | Discharge: 2018-10-14 | Disposition: A | Discharge status: Home / Self Care | Payer: Non-veteran care | Attending: Emergency Medicine | Admitting: Emergency Medicine

## 2018-10-14 DIAGNOSIS — D649 Anemia, unspecified: Secondary | ICD-10-CM

## 2018-10-14 DIAGNOSIS — Z9114 Patient's other noncompliance with medication regimen: Secondary | ICD-10-CM | POA: Diagnosis not present

## 2018-10-14 DIAGNOSIS — Z7982 Long term (current) use of aspirin: Secondary | ICD-10-CM | POA: Insufficient documentation

## 2018-10-14 DIAGNOSIS — E876 Hypokalemia: Secondary | ICD-10-CM | POA: Diagnosis not present

## 2018-10-14 DIAGNOSIS — E039 Hypothyroidism, unspecified: Secondary | ICD-10-CM

## 2018-10-14 DIAGNOSIS — E119 Type 2 diabetes mellitus without complications: Secondary | ICD-10-CM | POA: Diagnosis not present

## 2018-10-14 DIAGNOSIS — I700 Atherosclerosis of aorta: Secondary | ICD-10-CM | POA: Diagnosis not present

## 2018-10-14 DIAGNOSIS — Z91040 Latex allergy status: Secondary | ICD-10-CM | POA: Diagnosis not present

## 2018-10-14 DIAGNOSIS — I10 Essential (primary) hypertension: Secondary | ICD-10-CM | POA: Insufficient documentation

## 2018-10-14 DIAGNOSIS — J40 Bronchitis, not specified as acute or chronic: Secondary | ICD-10-CM

## 2018-10-14 DIAGNOSIS — M159 Polyosteoarthritis, unspecified: Secondary | ICD-10-CM

## 2018-10-14 DIAGNOSIS — Z79899 Other long term (current) drug therapy: Secondary | ICD-10-CM | POA: Insufficient documentation

## 2018-10-14 DIAGNOSIS — J4520 Mild intermittent asthma, uncomplicated: Secondary | ICD-10-CM | POA: Diagnosis not present

## 2018-10-14 DIAGNOSIS — I272 Pulmonary hypertension, unspecified: Secondary | ICD-10-CM | POA: Diagnosis not present

## 2018-10-14 DIAGNOSIS — M25562 Pain in left knee: Secondary | ICD-10-CM | POA: Diagnosis present

## 2018-10-14 HISTORY — DX: Type 2 diabetes mellitus without complications: E11.9

## 2018-10-14 HISTORY — DX: Systemic involvement of connective tissue, unspecified: M35.9

## 2018-10-14 LAB — COMPREHENSIVE METABOLIC PANEL
ALT: 7 U/L (ref 0–44)
AST: 19 U/L (ref 15–41)
Albumin: 3.3 g/dL — ABNORMAL LOW (ref 3.5–5.0)
Alkaline Phosphatase: 60 U/L (ref 38–126)
Anion gap: 11 (ref 5–15)
BUN: 9 mg/dL (ref 8–23)
CO2: 23 mmol/L (ref 22–32)
Calcium: 8.8 mg/dL — ABNORMAL LOW (ref 8.9–10.3)
Chloride: 103 mmol/L (ref 98–111)
Creatinine, Ser: 0.76 mg/dL (ref 0.44–1.00)
GFR calc Af Amer: >60 mL/min (ref >60)
GFR calc non Af Amer: >60 mL/min (ref >60)
Glucose, Bld: 108 mg/dL — ABNORMAL HIGH (ref 70–99)
Potassium: 3.2 mmol/L — ABNORMAL LOW (ref 3.5–5.1)
Sodium: 137 mmol/L (ref 135–145)
Total Bilirubin: 0.7 mg/dL (ref 0.3–1.2)
Total Protein: 7.6 g/dL (ref 6.5–8.1)

## 2018-10-14 LAB — CBC
HCT: 29.6 % — ABNORMAL LOW (ref 36.0–46.0)
Hemoglobin: 9.1 g/dL — ABNORMAL LOW (ref 12.0–15.0)
MCH: 24.7 pg — ABNORMAL LOW (ref 26.0–34.0)
MCHC: 30.7 g/dL (ref 30.0–36.0)
MCV: 80.4 fL (ref 80.0–100.0)
Platelets: 316 10*3/uL (ref 150–400)
RBC: 3.68 MIL/uL — ABNORMAL LOW (ref 3.87–5.11)
RDW: 16.9 % — ABNORMAL HIGH (ref 11.5–15.5)
WBC: 9.1 10*3/uL (ref 4.0–10.5)
nRBC: 0 % (ref 0.0–0.2)

## 2018-10-14 LAB — TSH: TSH: 25.909 u[IU]/mL — ABNORMAL HIGH (ref 0.350–4.500)

## 2018-10-14 MED ORDER — OXYCODONE-ACETAMINOPHEN 5-325 MG PO TABS: 0.5000 | ORAL_TABLET | Freq: Three times a day (TID) | ORAL | 0 refills | Status: DC | PRN

## 2018-10-14 MED ORDER — POTASSIUM CHLORIDE CRYS ER 20 MEQ PO TBCR
40.0000 meq | EXTENDED_RELEASE_TABLET | Freq: Once | ORAL | Status: AC
  Administered 2018-10-14: 40 meq via ORAL
  Filled 2018-10-14: qty 2

## 2018-10-14 MED ORDER — SODIUM CHLORIDE 0.9 % IV BOLUS
1000.0000 mL | Freq: Once | INTRAVENOUS | Status: AC
  Administered 2018-10-14: 1000 mL via INTRAVENOUS

## 2018-10-14 MED ORDER — DOXYCYCLINE HYCLATE 50 MG PO CAPS: 100.0000 mg | ORAL_CAPSULE | Freq: Two times a day (BID) | ORAL | 0 refills | Status: AC

## 2018-10-14 MED ORDER — PREDNISONE 10 MG PO TABS: ORAL_TABLET | ORAL | 0 refills | Status: DC

## 2018-10-14 MED ORDER — TRAMADOL HCL 50 MG PO TABS
50.0000 mg | ORAL_TABLET | Freq: Once | ORAL | Status: AC
  Administered 2018-10-14: 50 mg via ORAL
  Filled 2018-10-14: qty 1

## 2018-10-14 MED ORDER — IPRATROPIUM-ALBUTEROL 0.5-2.5 (3) MG/3ML IN SOLN
3.0000 mL | Freq: Once | RESPIRATORY_TRACT | Status: AC
  Administered 2018-10-14: 3 mL via RESPIRATORY_TRACT
  Filled 2018-10-14: qty 3

## 2018-10-14 MED ORDER — POTASSIUM CHLORIDE ER 10 MEQ PO TBCR: 10.0000 meq | EXTENDED_RELEASE_TABLET | Freq: Every day | ORAL | 0 refills | Status: DC

## 2018-10-14 MED ORDER — IOPAMIDOL (ISOVUE-370) INJECTION 76%
75.0000 mL | Freq: Once | INTRAVENOUS | Status: AC | PRN
  Administered 2018-10-14: 75 mL via INTRAVENOUS
  Filled 2018-10-14: qty 75

## 2018-10-14 NOTE — ED Triage Notes (Signed)
PT arrives with complaints of knee pain, both left and right. Pt states she was seen here and diagnosed with a bakers cyst on the left side but reports inability to control the pain. PT has a HX of rheumatoid arthritis. PT reports new injury today after mechanical off bed, pt reports "my knee gave out" Pt states the pain has been so bad it has debilitated pt from performing ADLS. No chest pain or shortness of breath reported in triage.

## 2018-10-14 NOTE — ED Notes (Signed)
Occult blood card per PA Caryl Pina Negative

## 2018-10-14 NOTE — ED Provider Notes (Addendum)
[facility] Emergency Department Provider Note  ____________________________________________  Time seen: Approximately 11:46 AM  I have reviewed the triage vital signs and the nursing notes.   HISTORY  Chief Complaint Knee Pain    HPI Sandra Fischer is a 66 y.o. female that presents emergency department for evaluation of chronic bilateral knee pain worse on the left that is worse today.  She states that her left knee gave out on her and she almost fell this morning but her husband caught her.  She has a history of gout, rheumatoid arthritis, fibromyalgia. Patient also states that she was diagnosed with bronchitis 2 weeks ago.  She is still coughing.  Cough is usually nonproductive but occasionally has coughed up phlegm.  Patient finished a course of azithromycin 1 week ago.  She has a history of asthma.  She had a spirometry test several weeks ago, which was abnormal so they recommended echocardiogram, which she had this morning.  She came to the emergency department today to see if she still has bronchitis and for pain relief of her knees.  She does not smoke.  She has never had a blood clot.  She takes thyroid medication daily but has not been consistently taking it recently.  No chest pain, nausea, vomiting, abdominal pain.   Past Medical History:  Diagnosis Date  . Asthma   . Asthma   . Collagen vascular disease ([place])   . Diabetes mellitus without complication ([place])   . Environmental allergies   . High cholesterol   . Hypertension   . RA (rheumatoid arthritis) ([place])   . Thyroid disease     Patient Active Problem List   Diagnosis Date Noted  . Perennial and seasonal allergic rhinitis 02/09/2018  . Moderate persistent asthma 02/09/2018  . Allergic conjunctivitis 02/09/2018  . History of food allergy 02/09/2018  . Allergic reaction 02/09/2018  . Asthma exacerbation 08/22/2016  . RA (rheumatoid arthritis) ([place]) 08/22/2016  . HTN (hypertension) 08/22/2016     Past Surgical History:  Procedure Laterality Date  . CATARACT EXTRACTION    . CHOLECYSTECTOMY    . THYROIDECTOMY, PARTIAL      Prior to Admission medications   Medication Sig Start Date End Date Taking? Authorizing Provider  albuterol (ACCUNEB) 1.25 MG/3ML nebulizer solution Take 1 ampule by nebulization every 6 (six) hours as needed for wheezing.    [provider]  albuterol (PROVENTIL HFA;VENTOLIN HFA) 108 (90 Base) MCG/ACT inhaler Inhale 2 puffs into the lungs every 6 (six) hours as needed for wheezing or shortness of breath. 08/22/16   Nance Pear, MD  aspirin EC 81 MG tablet Take 81 mg by mouth daily.    [provider]  azelastine (ASTELIN) 0.1 % nasal spray Place 2 sprays into both nostrils 2 (two) times daily. 02/09/18   Bobbitt, Sed[place] Muta, MD  budesonide-formoterol [facility]) 160-4.5 MCG/ACT inhaler Inhale 2 puffs into the lungs 2 (two) times daily.    [provider]  cetirizine (ZYRTEC) 10 MG tablet Take 10 mg by mouth daily.    [provider]  desloratadine (CLARINEX) 5 MG tablet Take 5 mg by mouth daily.    [provider]  docusate sodium (COLACE) 50 MG capsule Take 50 mg by mouth 2 (two) times daily.    [provider]  doxycycline (VIBRAMYCIN) 50 MG capsule Take 2 capsules (100 mg total) by mouth 2 (two) times daily for 10 days. 10/14/18 10/24/18  Laban Emperor, PA-C  EPINEPHrine 0.3 mg/0.3 mL IJ SOAJ  injection Use as directed for severe allergic reaction. 05/12/18   Bobbitt, Sed[place] Muta, MD  fluticasone (FLONASE) 50 MCG/ACT nasal spray Place into both nostrils daily.    [provider]  guaiFENesin-dextromethorphan (ROBITUSSIN DM) 100-10 MG/5ML syrup Take 5 mLs by mouth every 4 (four) hours as needed for cough. 08/23/16   Dustin Flock, MD  ipratropium (ATROVENT) 0.02 % nebulizer solution Take 0.5 mg by nebulization 4 (four) times daily.    [provider]  leflunomide (ARAVA) 20 MG tablet  Take 20 mg by mouth daily.    [provider]  levothyroxine (SYNTHROID, LEVOTHROID) 88 MCG tablet Take 88 mcg by mouth daily before breakfast.    [provider]  lisinopril (PRINIVIL,ZESTRIL) 40 MG tablet Take 40 mg by mouth daily.    [provider]  montelukast (SINGULAIR) 10 MG tablet Take 10 mg by mouth at bedtime.    [provider]  olopatadine (PATANOL) 0.1 % ophthalmic solution Place 1 drop into both eyes 2 (two) times daily. 02/09/18   Bobbitt, Sedalia Muta, MD  omeprazole (PRILOSEC) 10 MG capsule Take 20 mg by mouth daily.    [provider]  oxyCODONE (ROXICODONE) 5 MG immediate release tablet Take 1 tablet (5 mg total) by mouth every 8 (eight) hours as needed. Patient not taking: Reported on 09/30/2018 08/18/18 08/18/19  Merlyn Lot, MD  oxyCODONE-acetaminophen (PERCOCET) 5-325 MG tablet Take 0.5 tablets by mouth every 8 (eight) hours as needed for severe pain. 10/14/18 10/14/19  Laban Emperor, PA-C  potassium chloride (K-DUR) 10 MEQ tablet Take 1 tablet (10 mEq total) by mouth daily. 10/14/18   Laban Emperor, PA-C  pravastatin (PRAVACHOL) 40 MG tablet Take 40 mg by mouth daily.    [provider]  prednisoLONE 5 MG TABS tablet Take by mouth.    [provider]  predniSONE (DELTASONE) 10 MG tablet Take 6 tablets day 1, take 5 tablets day 2, take 4 tablets day 3, take 3 tablets day 4, take 2 tablets day 5, take 1 tablet day 6 10/14/18   Laban Emperor, PA-C  Prenatal Vit-Fe Fumarate-FA (MULTIVITAMIN-PRENATAL) 27-0.8 MG TABS tablet Take 1 tablet by mouth daily at 12 noon.    [provider]  tiotropium (SPIRIVA) 18 MCG inhalation capsule Place 18 mcg into inhaler and inhale daily.    [provider]  traMADol (ULTRAM) 50 MG tablet Take 100 mg by mouth as needed (for rheumatoid arthritis).    [provider]  verapamil (CALAN) 120 MG tablet Take 240 mg by mouth 2 (two) times daily.    [provider]    Allergies Abatacept; Codeine; Shellfish allergy; Latex; and Penicillins  Family History  Problem Relation Age of Onset  . Diabetes Mother   . Hypertension Mother   . Hyperlipidemia Mother   . Breast cancer Neg Hx   . Ovarian cancer Neg Hx   . Colon cancer Neg Hx     Social History Social History   Tobacco Use  . Smoking status: Never Smoker  . Smokeless tobacco: Never Used  Substance Use Topics  . Alcohol use: No  . Drug use: No     Review of Systems  Constitutional: No fever/chills ENT: No upper respiratory complaints. Cardiovascular: No chest pain. Respiratory: Positive for cough. Gastrointestinal: No abdominal pain.  No nausea, no vomiting.  Musculoskeletal: Positive for knee pain. Skin: Negative for rash, abrasions, lacerations, ecchymosis. Neurological: Negative for headaches, numbness or tingling   ____________________________________________   PHYSICAL EXAM:  VITAL SIGNS: ED Triage Vitals  Enc Vitals Group     BP 10/14/18 1113 (!) 147/91     Pulse Rate 10/14/18 1113 (!) 118     Resp 10/14/18 1113 20     Temp 10/14/18 1113 98.9 F (37.2 C)     Temp Source 10/14/18 1113 Oral     SpO2 10/14/18 1113 99 %     Weight 10/14/18 1114 170 lb (77.1 kg)     Height 10/14/18 1114 5\' 2"  (1.575 m)     Head Circumference --      Peak Flow --      Pain Score --      Pain Loc --      Pain Edu? --      Excl. in [place]? --      Constitutional: Alert and oriented. Well appearing and in no acute distress. Eyes: Conjunctivae are normal. PERRL. EOMI. Head: Atraumatic. ENT:      Ears:      Nose: No congestion/rhinnorhea.      Mouth/Throat: Mucous membranes are moist.  Neck: No stridor.  Cardiovascular: Tachycardic rate, regular rhythm.  Good peripheral circulation. Respiratory: Normal respiratory effort without tachypnea or retractions. Lungs CTAB. Good air entry to the bases with no decreased or absent breath sounds. Gastrointestinal: Bowel  sounds 4 quadrants. Soft and nontender to palpation. No guarding or rigidity. No palpable masses. No distention. Musculoskeletal: Full range of motion to all extremities. No gross deformities appreciated.  Pain and tenderness to palpation of bilateral knees. Neurologic:  Normal speech and language. No gross focal neurologic deficits are appreciated.  Skin:  Skin is warm, dry and intact. No rash noted. Psychiatric: Mood and affect are normal. Speech and behavior are normal. Patient exhibits appropriate insight and judgement.   ____________________________________________   LABS (all labs ordered are listed, but only abnormal results are displayed)  Labs Reviewed  CBC - Abnormal; Notable for the following components:      Result Value   RBC 3.68 (*)    Hemoglobin 9.1 (*)    HCT 29.6 (*)    MCH 24.7 (*)    RDW 16.9 (*)    All other components within normal limits  COMPREHENSIVE METABOLIC PANEL - Abnormal; Notable for the following components:   Potassium 3.2 (*)    Glucose, Bld 108 (*)    Calcium 8.8 (*)    Albumin 3.3 (*)    All other components within normal limits  TSH - Abnormal; Notable for the following components:   TSH 25.909 (*)    All other components within normal limits  OCCULT BLOOD X 1 CARD TO LAB, STOOL   ____________________________________________  EKG  ST ____________________________________________  RADIOLOGY Robinette Haines, personally viewed and evaluated these images (plain radiographs) as part of my medical decision making, as well as reviewing the written report by the radiologist.  Dg Chest 2 View  Result Date: 10/14/2018 CLINICAL DATA:  Cough for 2 weeks EXAM: CHEST - 2 VIEW COMPARISON:  08/22/2016. FINDINGS: Porta catheter on the right with tip at the SVC. Normal heart size and mediastinal contours. Borderline airway thickening. There is no edema, consolidation, effusion, or pneumothorax. Cholecystectomy clips. IMPRESSION: 1. Questionable airway  thickening. 2. Negative for pneumonia. Electronically Signed   By: Monte Fantasia M.D.   On: 10/14/2018 12:40   Ct Angio Chest Pe W And/or Wo Contrast  Result Date: 10/14/2018 CLINICAL DATA:  Short of breath EXAM: CT ANGIOGRAPHY CHEST WITH CONTRAST TECHNIQUE: Multidetector CT imaging  of the chest was performed using the standard protocol during bolus administration of intravenous contrast. Multiplanar CT image reconstructions and MIPs were obtained to evaluate the vascular anatomy. CONTRAST:  52mL ISOVUE-370 IOPAMIDOL (ISOVUE-370) INJECTION 76% COMPARISON:  None. FINDINGS: Cardiovascular: There are no filling defects in the pulmonary arterial tree to suggest acute pulmonary thromboembolism. Atherosclerotic calcifications of the aortic arch are noted. No evidence of dissection or aneurysm. No obvious acute intramural hematoma. Right jugular Port-A-Cath is in place with its tip at cavoatrial junction. Mediastinum/Nodes: No abnormal mediastinal adenopathy. No pericardial effusion. Lungs/Pleura: No pneumothorax. No pleural effusion. Dependent atelectasis bilaterally. Upper Abdomen: No acute abnormality. Musculoskeletal: No vertebral compression deformity. Review of the MIP images confirms the above findings. IMPRESSION: No evidence of acute pulmonary thromboembolism. Aortic Atherosclerosis (ICD10-I70.0). Electronically Signed   By: Marybelle Killings M.D.   On: 10/14/2018 15:19   Dg Knee Complete 4 Views Left  Result Date: 10/14/2018 CLINICAL DATA:  LEFT knee pain, fell on LEFT knee this morning, history Baker cyst EXAM: LEFT KNEE - COMPLETE 4+ VIEW COMPARISON:  None FINDINGS: Osseous mineralization normal. Joint space narrowing at medial compartment. No acute fracture, dislocation or bone destruction. No significant knee joint effusion. Mild anterior soft tissue swelling infrapatellar. IMPRESSION: Mild degenerative changes LEFT knee. No acute abnormalities. Electronically Signed   By: Lavonia Dana M.D.   On:  10/14/2018 11:54    ____________________________________________    PROCEDURES  Procedure(s) performed:    Procedures    Medications  ipratropium-albuterol (DUONEB) 0.5-2.5 (3) MG/3ML nebulizer solution 3 mL (3 mLs Nebulization Given 10/14/18 1237)  traMADol (ULTRAM) tablet 50 mg (50 mg Oral Given 10/14/18 1237)  iopamidol (ISOVUE-370) 76 % injection 75 mL (75 mLs Intravenous Contrast Given 10/14/18 1457)  sodium chloride 0.9 % bolus 1,000 mL (0 mLs Intravenous Stopped 10/14/18 1630)  potassium chloride SA (K-DUR,KLOR-CON) CR tablet 40 mEq (40 mEq Oral Given 10/14/18 1632)     ____________________________________________   INITIAL IMPRESSION / ASSESSMENT AND PLAN / ED COURSE  Pertinent labs & imaging results that were available during my care of the patient were reviewed by me and considered in my medical decision making (see chart for details).  Review of the [place] CSRS was performed in accordance of the [place] prior to dispensing any controlled drugs.     Patient's diagnosis is consistent with osteoarthritis, hypothyroidism, hypokalemia, anemia, bronchitis, asthma, aortic atherosclerosis.  She presented to the  emergency department for evaluation of chronic bilateral knee pain and body aches worsening today.  Patient also states that she was diagnosed with bronchitis 2 weeks ago but cough has not improved.  Knee x-ray ordered in triage shows osteoarthritis.  Chest x-ray shows possible mucosal thickening.  Blood work remarkable for RBC 3.68, hemoglobin 9 and hematocrit 29.6.  Patient has had a history of anemia and used to take iron pills but was switched to prenatal vitamins by primary care.  Occult card negative for blood.  She has not noticed any blood in stool.  CMP remarkable for low potassium at 3.2, calcium 8.8.  Potassium was supplemented.  CT angiogram was ordered to further evaluate since patient was tachycardic in ED and has had a cough and occasionally SOB. EKG shows  sinus tachycardia. IV fluids were given. She was also mildly tachycardic last visit.  She  had a negative work-up for DVT last month of the left leg.  CT angiogram negative for PE.  Patient has not been taking her thyroid medication as prescribed.  TSH is 25.  Patient will be discharged home with prescriptions for potassium, prednisone, doxycycline, and percoct.  Patient will keep taking her prenatal vitamins with iron and begin her thyroid medication as prescribed.  She will use her inhaler as needed.  Dr. Burlene Arnt was consulted, reviewed work-up and case, and agrees with plan of care and discharge.  Patient is requesting to be discharged home.  Patient is to follow up with primary care as directed. She has good followup. Patient is given ED precautions to return to the ED for any worsening or new symptoms.     ____________________________________________  FINAL CLINICAL IMPRESSION(S) / ED DIAGNOSES  Final diagnoses:  Osteoarthritis of multiple joints, unspecified osteoarthritis type  Hypothyroidism, unspecified type  Hypokalemia  Anemia, unspecified type  Bronchitis  Intermittent asthma without complication, unspecified asthma severity  Aortic atherosclerosis ([place])      NEW MEDICATIONS STARTED DURING THIS VISIT:  ED Discharge Orders         Ordered    potassium chloride (K-DUR) 10 MEQ tablet  Daily     10/14/18 1542    predniSONE (DELTASONE) 10 MG tablet     10/14/18 1542    doxycycline (VIBRAMYCIN) 50 MG capsule  2 times daily     10/14/18 1542    oxyCODONE-acetaminophen (PERCOCET) 5-325 MG tablet  Every 8 hours PRN     10/14/18 1542              This chart was dictated using voice recognition software/Dragon. Despite best efforts to proofread, errors can occur which can change the meaning. Any change was purely unintentional.    Laban Emperor, PA-C 10/14/18 1849    Laban Emperor, PA-C 10/14/18 1849    Schuyler Amor, MD 10/21/18 [phone]

## 2018-10-14 NOTE — Discharge Instructions (Addendum)
Please call primary care for an appointment as soon as possible for reevaluation.  Your blood work showed that you have anemia.  Please follow-up with primary care for further work-up.  Continue taking your prenatal vitamins.  Please begin taking your thyroid medication as prescribed.  Follow-up with occupational therapy on the 21st for knee.  Start prednisone and doxycycline for bronchitis.  The prednisone will also help with your arthritis.  Use your inhaler as needed.  Return to the emergency department for any change or worsening of symptoms.

## 2018-10-14 NOTE — Progress Notes (Signed)
*  PRELIMINARY RESULTS* Echocardiogram 2D Echocardiogram has been performed.  Sandra Fischer 10/14/2018, 11:10 AM

## 2018-10-17 ENCOUNTER — Encounter: Payer: Self-pay | Admitting: Emergency Medicine

## 2018-10-17 ENCOUNTER — Emergency Department
Admission: EM | Admit: 2018-10-17 | Discharge: 2018-10-17 | Disposition: A | Discharge status: Home / Self Care | Payer: Non-veteran care | Attending: Emergency Medicine | Admitting: Emergency Medicine

## 2018-10-17 ENCOUNTER — Other Ambulatory Visit: Payer: Self-pay

## 2018-10-17 DIAGNOSIS — R079 Chest pain, unspecified: Secondary | ICD-10-CM | POA: Diagnosis present

## 2018-10-17 DIAGNOSIS — D649 Anemia, unspecified: Secondary | ICD-10-CM | POA: Insufficient documentation

## 2018-10-17 DIAGNOSIS — I471 Supraventricular tachycardia: Secondary | ICD-10-CM | POA: Insufficient documentation

## 2018-10-17 LAB — CBC WITH DIFFERENTIAL/PLATELET
Abs Immature Granulocytes: 0.1 10*3/uL — ABNORMAL HIGH (ref 0.00–0.07)
Basophils Absolute: 0 10*3/uL (ref 0.0–0.1)
Basophils Relative: 0 %
Eosinophils Absolute: 0 10*3/uL (ref 0.0–0.5)
Eosinophils Relative: 0 %
HCT: 28 % — ABNORMAL LOW (ref 36.0–46.0)
Hemoglobin: 8.5 g/dL — ABNORMAL LOW (ref 12.0–15.0)
Immature Granulocytes: 1 %
Lymphocytes Relative: 23 %
Lymphs Abs: 2.5 10*3/uL (ref 0.7–4.0)
MCH: 24.5 pg — ABNORMAL LOW (ref 26.0–34.0)
MCHC: 30.4 g/dL (ref 30.0–36.0)
MCV: 80.7 fL (ref 80.0–100.0)
Monocytes Absolute: 0.4 10*3/uL (ref 0.1–1.0)
Monocytes Relative: 4 %
Neutro Abs: 7.9 10*3/uL — ABNORMAL HIGH (ref 1.7–7.7)
Neutrophils Relative %: 72 %
Platelets: 422 10*3/uL — ABNORMAL HIGH (ref 150–400)
RBC: 3.47 MIL/uL — ABNORMAL LOW (ref 3.87–5.11)
RDW: 16.8 % — ABNORMAL HIGH (ref 11.5–15.5)
WBC: 11 10*3/uL — ABNORMAL HIGH (ref 4.0–10.5)
nRBC: 0 % (ref 0.0–0.2)

## 2018-10-17 LAB — COMPREHENSIVE METABOLIC PANEL
ALT: 17 U/L (ref 0–44)
AST: 44 U/L — ABNORMAL HIGH (ref 15–41)
Albumin: 3.3 g/dL — ABNORMAL LOW (ref 3.5–5.0)
Alkaline Phosphatase: 70 U/L (ref 38–126)
Anion gap: 13 (ref 5–15)
BUN: 16 mg/dL (ref 8–23)
CO2: 20 mmol/L — ABNORMAL LOW (ref 22–32)
Calcium: 9.1 mg/dL (ref 8.9–10.3)
Chloride: 101 mmol/L (ref 98–111)
Creatinine, Ser: 1.22 mg/dL — ABNORMAL HIGH (ref 0.44–1.00)
GFR calc Af Amer: 53 mL/min — ABNORMAL LOW (ref >60)
GFR calc non Af Amer: 46 mL/min — ABNORMAL LOW (ref >60)
Glucose, Bld: 180 mg/dL — ABNORMAL HIGH (ref 70–99)
Potassium: 3.9 mmol/L (ref 3.5–5.1)
Sodium: 134 mmol/L — ABNORMAL LOW (ref 135–145)
Total Bilirubin: 0.4 mg/dL (ref 0.3–1.2)
Total Protein: 7.6 g/dL (ref 6.5–8.1)

## 2018-10-17 MED ORDER — PANTOPRAZOLE SODIUM 40 MG PO TBEC: 40.0000 mg | DELAYED_RELEASE_TABLET | Freq: Every day | ORAL | 0 refills | Status: DC

## 2018-10-17 MED ORDER — ADENOSINE 12 MG/4ML IV SOLN
INTRAVENOUS | Status: AC
  Filled 2018-10-17: qty 4

## 2018-10-17 MED ORDER — MAGNESIUM SULFATE 2 GM/50ML IV SOLN
2.0000 g | Freq: Once | INTRAVENOUS | Status: AC
  Administered 2018-10-17: 2 g via INTRAVENOUS
  Filled 2018-10-17: qty 50

## 2018-10-17 MED ORDER — SODIUM CHLORIDE 0.9 % IV BOLUS
1000.0000 mL | Freq: Once | INTRAVENOUS | Status: AC
  Administered 2018-10-17: 1000 mL via INTRAVENOUS

## 2018-10-17 MED ORDER — ADENOSINE 6 MG/2ML IV SOLN
INTRAVENOUS | Status: AC
  Administered 2018-10-17: 6 mg
  Filled 2018-10-17: qty 2

## 2018-10-17 NOTE — ED Triage Notes (Signed)
Patient states that she was recently diagnosed bronchitis and started on medications. Patient states that tonight she developed chest pain and increased shortness of breath.

## 2018-10-17 NOTE — Discharge Instructions (Signed)
Today you had an episode of supraventricular tachycardia that we fixed with adenosine.  It is normally not a dangerous thing but it is important to get checked out by the cardiologist within a week for a recheck.  Today we also noticed that your hemoglobin has dropped down to 8.5 however your heart rate was normal so it seems like this is been a chronic process.  Please follow-up with your primary care physician in 2 days for recheck and to consider further testing to see if this could be related to your rheumatoid arthritis or possibly a slow GI bleed.  I do recommend you begin taking an antacid every day to help prevent any further bleeding.  Return to the emergency department for any concerns.  It was a pleasure to take care of you today, and thank you for coming to our emergency department.  If you have any questions or concerns before leaving please ask the nurse to grab me and I'm more than happy to go through your aftercare instructions again.  If you were prescribed any opioid pain medication today such as Norco, Vicodin, Percocet, morphine, hydrocodone, or oxycodone please make sure you do not drive when you are taking this medication as it can alter your ability to drive safely.  If you have any concerns once you are home that you are not improving or are in fact getting worse before you can make it to your follow-up appointment, please do not hesitate to call [phone] and come back for further evaluation.  Darel Hong, MD  Results for orders placed or performed during the hospital encounter of 10/17/18  Comprehensive metabolic panel  Result Value Ref Range   Sodium 134 (L) 135 - 145 mmol/L   Potassium 3.9 3.5 - 5.1 mmol/L   Chloride 101 98 - 111 mmol/L   CO2 20 (L) 22 - 32 mmol/L   Glucose, Bld 180 (H) 70 - 99 mg/dL   BUN 16 8 - 23 mg/dL   Creatinine, Ser 1.22 (H) 0.44 - 1.00 mg/dL   Calcium 9.1 8.9 - 10.3 mg/dL   Total Protein 7.6 6.5 - 8.1 g/dL   Albumin 3.3 (L) 3.5 - 5.0 g/dL   AST  44 (H) 15 - 41 U/L   ALT 17 0 - 44 U/L   Alkaline Phosphatase 70 38 - 126 U/L   Total Bilirubin 0.4 0.3 - 1.2 mg/dL   GFR calc non Af Amer 46 (L) >60 mL/min   GFR calc Af Amer 53 (L) >60 mL/min   Anion gap 13 5 - 15  CBC with Differential  Result Value Ref Range   WBC 11.0 (H) 4.0 - 10.5 K/uL   RBC 3.47 (L) 3.87 - 5.11 MIL/uL   Hemoglobin 8.5 (L) 12.0 - 15.0 g/dL   HCT 28.0 (L) 36.0 - 46.0 %   MCV 80.7 80.0 - 100.0 fL   MCH 24.5 (L) 26.0 - 34.0 pg   MCHC 30.4 30.0 - 36.0 g/dL   RDW 16.8 (H) 11.5 - 15.5 %   Platelets 422 (H) 150 - 400 K/uL   nRBC 0.0 0.0 - 0.2 %   Neutrophils Relative % 72 %   Neutro Abs 7.9 (H) 1.7 - 7.7 K/uL   Lymphocytes Relative 23 %   Lymphs Abs 2.5 0.7 - 4.0 K/uL   Monocytes Relative 4 %   Monocytes Absolute 0.4 0.1 - 1.0 K/uL   Eosinophils Relative 0 %   Eosinophils Absolute 0.0 0.0 - 0.5 K/uL  Basophils Relative 0 %   Basophils Absolute 0.0 0.0 - 0.1 K/uL   Immature Granulocytes 1 %   Abs Immature Granulocytes 0.10 (H) 0.00 - 0.07 K/uL   Dg Chest 2 View  Result Date: 10/14/2018 CLINICAL DATA:  Cough for 2 weeks EXAM: CHEST - 2 VIEW COMPARISON:  08/22/2016. FINDINGS: Porta catheter on the right with tip at the SVC. Normal heart size and mediastinal contours. Borderline airway thickening. There is no edema, consolidation, effusion, or pneumothorax. Cholecystectomy clips. IMPRESSION: 1. Questionable airway thickening. 2. Negative for pneumonia. Electronically Signed   By: Monte Fantasia M.D.   On: 10/14/2018 12:40   Ct Angio Chest Pe W And/or Wo Contrast  Result Date: 10/14/2018 CLINICAL DATA:  Short of breath EXAM: CT ANGIOGRAPHY CHEST WITH CONTRAST TECHNIQUE: Multidetector CT imaging of the chest was performed using the standard protocol during bolus administration of intravenous contrast. Multiplanar CT image reconstructions and MIPs were obtained to evaluate the vascular anatomy. CONTRAST:  76mL ISOVUE-370 IOPAMIDOL (ISOVUE-370) INJECTION 76%  COMPARISON:  None. FINDINGS: Cardiovascular: There are no filling defects in the pulmonary arterial tree to suggest acute pulmonary thromboembolism. Atherosclerotic calcifications of the aortic arch are noted. No evidence of dissection or aneurysm. No obvious acute intramural hematoma. Right jugular Port-A-Cath is in place with its tip at cavoatrial junction. Mediastinum/Nodes: No abnormal mediastinal adenopathy. No pericardial effusion. Lungs/Pleura: No pneumothorax. No pleural effusion. Dependent atelectasis bilaterally. Upper Abdomen: No acute abnormality. Musculoskeletal: No vertebral compression deformity. Review of the MIP images confirms the above findings. IMPRESSION: No evidence of acute pulmonary thromboembolism. Aortic Atherosclerosis (ICD10-I70.0). Electronically Signed   By: Marybelle Killings M.D.   On: 10/14/2018 15:19   Dg Knee Complete 4 Views Left  Result Date: 10/14/2018 CLINICAL DATA:  LEFT knee pain, fell on LEFT knee this morning, history Baker cyst EXAM: LEFT KNEE - COMPLETE 4+ VIEW COMPARISON:  None FINDINGS: Osseous mineralization normal. Joint space narrowing at medial compartment. No acute fracture, dislocation or bone destruction. No significant knee joint effusion. Mild anterior soft tissue swelling infrapatellar. IMPRESSION: Mild degenerative changes LEFT knee. No acute abnormalities. Electronically Signed   By: Lavonia Dana M.D.   On: 10/14/2018 11:54

## 2018-10-17 NOTE — ED Notes (Signed)
Pt to the ER

## 2018-10-17 NOTE — ED Notes (Signed)
Pt rhythm is now WNL at a rate of 100

## 2018-10-17 NOTE — ED Notes (Signed)
Pt up to the bedside commode. Heart rate currently 88.

## 2018-10-17 NOTE — ED Provider Notes (Signed)
[facility] Emergency Department Provider Note  ____________________________________________   First MD Initiated Contact with Patient 10/17/18 0040     (approximate)  I have reviewed the triage vital signs and the nursing notes.   HISTORY  Chief Complaint Chest Pain and Shortness of Breath    HPI Sandra Fischer is a 67 y.o. female who self presents to the emergency department with chest pain and shortness of breath for roughly the past 24 hours.  Her symptoms were sudden onset and have been constant ever since.  She says she was recently diagnosed with bronchitis and she has a history of asthma.  She has been using her albuterol as well as prednisone and doxycycline and initially did well until about 24 hours ago.  She also has a history of rheumatoid arthritis.  No history of coronary artery disease.  Her symptoms came on suddenly are constant worse with exertion somewhat improved with rest although never go away completely.  Her pain is nonradiating.    Past Medical History:  Diagnosis Date  . Asthma   . Asthma   . Collagen vascular disease ([place])   . Diabetes mellitus without complication ([place])   . Environmental allergies   . High cholesterol   . Hypertension   . RA (rheumatoid arthritis) ([place])   . Thyroid disease     Patient Active Problem List   Diagnosis Date Noted  . Perennial and seasonal allergic rhinitis 02/09/2018  . Moderate persistent asthma 02/09/2018  . Allergic conjunctivitis 02/09/2018  . History of food allergy 02/09/2018  . Allergic reaction 02/09/2018  . Asthma exacerbation 08/22/2016  . RA (rheumatoid arthritis) ([place]) 08/22/2016  . HTN (hypertension) 08/22/2016    Past Surgical History:  Procedure Laterality Date  . CATARACT EXTRACTION    . CHOLECYSTECTOMY    . THYROIDECTOMY, PARTIAL      Prior to Admission medications   Medication Sig Start Date End Date Taking? Authorizing Provider  albuterol (ACCUNEB) 1.25 MG/3ML  nebulizer solution Take 1 ampule by nebulization every 6 (six) hours as needed for wheezing.    [provider]  albuterol (PROVENTIL HFA;VENTOLIN HFA) 108 (90 Base) MCG/ACT inhaler Inhale 2 puffs into the lungs every 6 (six) hours as needed for wheezing or shortness of breath. 08/22/16   Nance Pear, MD  aspirin EC 81 MG tablet Take 81 mg by mouth daily.    [provider]  azelastine (ASTELIN) 0.1 % nasal spray Place 2 sprays into both nostrils 2 (two) times daily. 02/09/18   Bobbitt, Sedalia Muta, MD  budesonide-formoterol [facility]) 160-4.5 MCG/ACT inhaler Inhale 2 puffs into the lungs 2 (two) times daily.    [provider]  cetirizine (ZYRTEC) 10 MG tablet Take 10 mg by mouth daily.    [provider]  desloratadine (CLARINEX) 5 MG tablet Take 5 mg by mouth daily.    [provider]  docusate sodium (COLACE) 50 MG capsule Take 50 mg by mouth 2 (two) times daily.    [provider]  doxycycline (VIBRAMYCIN) 50 MG capsule Take 2 capsules (100 mg total) by mouth 2 (two) times daily for 10 days. 10/14/18 10/24/18  Laban Emperor, PA-C  EPINEPHrine 0.3 mg/0.3 mL IJ SOAJ injection Use as directed for severe allergic reaction. 05/12/18   Bobbitt, Sedalia Muta, MD  fluticasone (FLONASE) 50 MCG/ACT nasal spray Place into both nostrils daily.    [provider]  guaiFENesin-dextromethorphan (ROBITUSSIN DM) 100-10 MG/5ML syrup Take 5 mLs by mouth every 4 (four)  hours as needed for cough. 08/23/16   Dustin Flock, MD  ipratropium (ATROVENT) 0.02 % nebulizer solution Take 0.5 mg by nebulization 4 (four) times daily.    [provider]  leflunomide (ARAVA) 20 MG tablet Take 20 mg by mouth daily.    [provider]  levothyroxine (SYNTHROID, LEVOTHROID) 88 MCG tablet Take 88 mcg by mouth daily before breakfast.    [provider]  lisinopril (PRINIVIL,ZESTRIL) 40 MG tablet Take 40 mg by mouth daily.    [provider]  montelukast (SINGULAIR) 10 MG tablet Take 10 mg by mouth at bedtime.    [provider]  olopatadine (PATANOL) 0.1 % ophthalmic solution Place 1 drop into both eyes 2 (two) times daily. 02/09/18   Bobbitt, Sedalia Muta, MD  omeprazole (PRILOSEC) 10 MG capsule Take 20 mg by mouth daily.    [provider]  oxyCODONE (ROXICODONE) 5 MG immediate release tablet Take 1 tablet (5 mg total) by mouth every 8 (eight) hours as needed. Patient not taking: Reported on 09/30/2018 08/18/18 08/18/19  Merlyn Lot, MD  oxyCODONE-acetaminophen (PERCOCET) 5-325 MG tablet Take 0.5 tablets by mouth every 8 (eight) hours as needed for severe pain. 10/14/18 10/14/19  Laban Emperor, PA-C  pantoprazole (PROTONIX) 40 MG tablet Take 1 tablet (40 mg total) by mouth daily. 10/17/18 10/17/19  Darel Hong, MD  potassium chloride (K-DUR) 10 MEQ tablet Take 1 tablet (10 mEq total) by mouth daily. 10/14/18   Laban Emperor, PA-C  pravastatin (PRAVACHOL) 40 MG tablet Take 40 mg by mouth daily.    [provider]  prednisoLONE 5 MG TABS tablet Take by mouth.    [provider]  predniSONE (DELTASONE) 10 MG tablet Take 6 tablets day 1, take 5 tablets day 2, take 4 tablets day 3, take 3 tablets day 4, take 2 tablets day 5, take 1 tablet day 6 10/14/18   Laban Emperor, PA-C  Prenatal Vit-Fe Fumarate-FA (MULTIVITAMIN-PRENATAL) 27-0.8 MG TABS tablet Take 1 tablet by mouth daily at 12 noon.    [provider]  tiotropium (SPIRIVA) 18 MCG inhalation capsule Place 18 mcg into inhaler and inhale daily.    [provider]  traMADol (ULTRAM) 50 MG tablet Take 100 mg by mouth as needed (for rheumatoid arthritis).    [provider]  verapamil (CALAN) 120 MG tablet Take 240 mg by mouth 2 (two) times daily.    [provider]    Allergies Abatacept; Codeine; Shellfish allergy; Latex; and Penicillins  Family History  Problem Relation Age of Onset  .  Diabetes Mother   . Hypertension Mother   . Hyperlipidemia Mother   . Breast cancer Neg Hx   . Ovarian cancer Neg Hx   . Colon cancer Neg Hx     Social History Social History   Tobacco Use  . Smoking status: Never Smoker  . Smokeless tobacco: Never Used  Substance Use Topics  . Alcohol use: No  . Drug use: No    Review of Systems Constitutional: No fever/chills Eyes: No visual changes. ENT: No sore throat. Cardiovascular: Positive for chest pain. Respiratory: Positive for shortness of breath. Gastrointestinal: No abdominal pain.  No nausea, no vomiting.  No diarrhea.  No constipation. Genitourinary: Negative for dysuria. Musculoskeletal: Negative for back pain. Skin: Negative for rash. Neurological: Negative for headaches, focal weakness or numbness.   ____________________________________________   PHYSICAL EXAM:  VITAL SIGNS: ED Triage Vitals  Enc Vitals Group     BP --  Pulse --      Resp --      Temp --      Temp src --      SpO2 --      Weight 10/17/18 0036 170 lb (77.1 kg)     Height 10/17/18 0036 5\' 2"  (1.575 m)     Head Circumference --      Peak Flow --      Pain Score 10/17/18 0035 9     Pain Loc --      Pain Edu? --      Excl. in [place]? --     Constitutional: Alert and oriented x4 uncomfortable appearing nontoxic no diaphoresis speaks full clear sentences Eyes: PERRL EOMI. Head: Atraumatic. Nose: No congestion/rhinnorhea. Mouth/Throat: No trismus Neck: No stridor.   Cardiovascular: Tachycardic rate, regular rhythm. Grossly normal heart sounds.  Good peripheral circulation. Respiratory: Somewhat increased respiratory effort.  No retractions. Lungs CTAB and moving good air Gastrointestinal: Soft and nontender Musculoskeletal: No lower extremity edema legs are equal in size Neurologic:  Normal speech and language. No gross focal neurologic deficits are appreciated. Skin:  Skin is warm, dry and intact. No rash noted. Psychiatric: Mood and  affect are normal. Speech and behavior are normal.    ____________________________________________   DIFFERENTIAL includes but not limited to  SVT, ventricular tachycardia, atrial fibrillation, sinus tachycardia, dehydration, Wolff-Parkinson-White ____________________________________________   LABS (all labs ordered are listed, but only abnormal results are displayed)  Labs Reviewed  COMPREHENSIVE METABOLIC PANEL - Abnormal; Notable for the following components:      Result Value   Sodium 134 (*)    CO2 20 (*)    Glucose, Bld 180 (*)    Creatinine, Ser 1.22 (*)    Albumin 3.3 (*)    AST 44 (*)    GFR calc non Af Amer 46 (*)    GFR calc Af Amer 53 (*)    All other components within normal limits  CBC WITH DIFFERENTIAL/PLATELET - Abnormal; Notable for the following components:   WBC 11.0 (*)    RBC 3.47 (*)    Hemoglobin 8.5 (*)    HCT 28.0 (*)    MCH 24.5 (*)    RDW 16.8 (*)    Platelets 422 (*)    Neutro Abs 7.9 (*)    Abs Immature Granulocytes 0.10 (*)    All other components within normal limits    Lab work reviewed by me shows hemoglobin of 8.5 which is down from previous 2 years ago although consistent with most recent this week __________________________________________  EKG  ED ECG REPORT I, Darel Hong, the attending physician, personally viewed and interpreted this ECG.  Date: 10/17/2018 EKG Time: 0038 Rate: 172 Rhythm: Supraventricular tachycardia QRS Axis: normal Intervals: normal ST/T Wave abnormalities: normal Narrative Interpretation: no evidence of acute ischemia  ____________________________________________  RADIOLOGY   ____________________________________________   PROCEDURES  Procedure(s) performed: Yes  .Critical Care Performed by: Darel Hong, MD Authorized by: Darel Hong, MD   Critical care provider statement:    Critical care time (minutes):  30   Critical care time was exclusive of:  Separately billable  procedures and treating other patients   Critical care was necessary to treat or prevent imminent or life-threatening deterioration of the following conditions:  Cardiac failure   Critical care was time spent personally by me on the following activities:  Development of treatment plan with patient or surrogate, discussions with consultants, evaluation of patient's response to treatment, examination of  patient, obtaining history from patient or surrogate, ordering and performing treatments and interventions, ordering and review of laboratory studies, ordering and review of radiographic studies, pulse oximetry, re-evaluation of patient's condition and review of old charts .Cardioversion Date/Time: 10/17/2018 12:53 AM Performed by: Darel Hong, MD Authorized by: Darel Hong, MD   Consent:    Consent obtained:  Verbal   Consent given by:  Patient   Risks discussed:  Induced arrhythmia and pain   Alternatives discussed:  Rate-control medication and alternative treatment Pre-procedure details:    Cardioversion basis:  Emergent   Rhythm:  Supraventricular tachycardia Patient sedated: No Attempt one:    Cardioversion mode attempt one: chemical cardioversion with 6mg  adenosine IV push. Post-procedure details:    Patient status:  Awake   Patient tolerance of procedure:  Tolerated well, no immediate complications Comments:     Review 6 mg of adenosine IV push to convert the patient's supraventricular tachycardia to sinus tachycardia.  She was uncomfortable during the adenosine however she tolerated well      Critical Care performed: Yes  ____________________________________________   INITIAL IMPRESSION / ASSESSMENT AND PLAN / ED COURSE  Pertinent labs & imaging results that were available during my care of the patient were reviewed by me and considered in my medical decision making (see chart for details).   As part of my medical decision making, I reviewed the following data  within the [place] History obtained from family if available, nursing notes, old chart and ekg, as well as notes from prior ED visits.  The patient comes to the emergency department with a regular narrow complex tachycardia to about 170 that is exquisitely regular with no P waves visualized.  She has no previous history of arrhythmia.  This appeared to be supraventricular tachycardia and as she is hemodynamically stable we opted for first trial of 6 mg of adenosine which did break the arrhythmia.  We will give her IV magnesium, gentle IV fluids, and check electrolytes and a CBC.  The patient's electrolytes are unremarkable however her CBC came back showing anemia which the patient states she was told about several days ago.  She has no history of anemia in the past however she does have a long-standing history of rheumatoid arthritis.  Her heart rate is now in the 70s and if this were an acute drop she would likely be tachycardic.  I do think is reasonable to begin her on Protonix and refer her back to her primary care physician this week for recheck.  Strict return precautions have been given and the patient verbalizes understanding and agreement with the plan.  I will also refer her to cardiology given this episode of arrhythmia.      ____________________________________________   FINAL CLINICAL IMPRESSION(S) / ED DIAGNOSES  Final diagnoses:  SVT (supraventricular tachycardia) (HCC)  Anemia, unspecified type      NEW MEDICATIONS STARTED DURING THIS VISIT:  Discharge Medication List as of 10/17/2018  2:25 AM    START taking these medications   Details  pantoprazole (PROTONIX) 40 MG tablet Take 1 tablet (40 mg total) by mouth daily., Starting Sun 10/17/2018, Until Mon 10/17/2019, Print         Note:  This document was prepared using Dragon voice recognition software and may include unintentional dictation errors.     Darel Hong, MD 10/17/18 [phone]

## 2018-11-26 ENCOUNTER — Emergency Department
Admission: EM | Admit: 2018-11-26 | Discharge: 2018-11-26 | Disposition: A | Discharge status: Home / Self Care | Payer: Medicare Other | Attending: Emergency Medicine | Admitting: Emergency Medicine

## 2018-11-26 ENCOUNTER — Emergency Department: Payer: Medicare Other

## 2018-11-26 DIAGNOSIS — Z7982 Long term (current) use of aspirin: Secondary | ICD-10-CM | POA: Insufficient documentation

## 2018-11-26 DIAGNOSIS — J40 Bronchitis, not specified as acute or chronic: Secondary | ICD-10-CM | POA: Diagnosis not present

## 2018-11-26 DIAGNOSIS — R059 Cough, unspecified: Secondary | ICD-10-CM

## 2018-11-26 DIAGNOSIS — I10 Essential (primary) hypertension: Secondary | ICD-10-CM | POA: Diagnosis not present

## 2018-11-26 DIAGNOSIS — R079 Chest pain, unspecified: Secondary | ICD-10-CM | POA: Insufficient documentation

## 2018-11-26 DIAGNOSIS — Z91040 Latex allergy status: Secondary | ICD-10-CM | POA: Insufficient documentation

## 2018-11-26 DIAGNOSIS — E119 Type 2 diabetes mellitus without complications: Secondary | ICD-10-CM | POA: Diagnosis not present

## 2018-11-26 DIAGNOSIS — Z79899 Other long term (current) drug therapy: Secondary | ICD-10-CM | POA: Insufficient documentation

## 2018-11-26 DIAGNOSIS — R05 Cough: Secondary | ICD-10-CM

## 2018-11-26 LAB — CBC
HCT: 33.3 % — ABNORMAL LOW (ref 36.0–46.0)
Hemoglobin: 10.3 g/dL — ABNORMAL LOW (ref 12.0–15.0)
MCH: 25.5 pg — ABNORMAL LOW (ref 26.0–34.0)
MCHC: 30.9 g/dL (ref 30.0–36.0)
MCV: 82.4 fL (ref 80.0–100.0)
Platelets: 266 10*3/uL (ref 150–400)
RBC: 4.04 MIL/uL (ref 3.87–5.11)
RDW: 18.3 % — ABNORMAL HIGH (ref 11.5–15.5)
WBC: 7 10*3/uL (ref 4.0–10.5)
nRBC: 0 % (ref 0.0–0.2)

## 2018-11-26 LAB — BASIC METABOLIC PANEL
Anion gap: 8 (ref 5–15)
BUN: 13 mg/dL (ref 8–23)
CO2: 26 mmol/L (ref 22–32)
Calcium: 9.3 mg/dL (ref 8.9–10.3)
Chloride: 107 mmol/L (ref 98–111)
Creatinine, Ser: 0.75 mg/dL (ref 0.44–1.00)
GFR calc Af Amer: >60 mL/min (ref >60)
GFR calc non Af Amer: >60 mL/min (ref >60)
Glucose, Bld: 160 mg/dL — ABNORMAL HIGH (ref 70–99)
Potassium: 3.7 mmol/L (ref 3.5–5.1)
Sodium: 141 mmol/L (ref 135–145)

## 2018-11-26 LAB — TROPONIN I: Troponin I: <0.03 ng/mL (ref <0.03)

## 2018-11-26 MED ORDER — PREDNISONE 10 MG (21) PO TBPK: ORAL_TABLET | ORAL | 0 refills | Status: DC

## 2018-11-26 NOTE — ED Triage Notes (Signed)
Patient c/o cough, SOB, and chest pain. Patient seen at [facility] today for same. Patient reports SOB and chest pain have resolved.

## 2018-11-26 NOTE — ED Provider Notes (Signed)
[facility] Emergency Department Provider Note  ____________________________________________   I have reviewed the triage vital signs and the nursing notes.   HISTORY  Chief Complaint Cough and Chest Pain   History limited by: Not Limited   HPI Sandra Fischer is a 67 y.o. female who presents to the emergency department today because of concerns for intermittent chest pain and cough.  Patient states that she initially went to urgent care because of the symptoms.  She does states she has a history of bronchitis and this feels similar.  The pain is located on her left chest and is intermittent.  She has also had a cough.  She had similar symptoms a couple months ago and was treated for bronchitis with a 20-day course of doxycycline.  Had been doing fine since then.  She denies any fevers.  Per medical record review patient has a history of asthma, RA.  Past Medical History:  Diagnosis Date  . Asthma   . Asthma   . Collagen vascular disease ([place])   . Diabetes mellitus without complication ([place])   . Environmental allergies   . High cholesterol   . Hypertension   . RA (rheumatoid arthritis) ([place])   . Thyroid disease     Patient Active Problem List   Diagnosis Date Noted  . Perennial and seasonal allergic rhinitis 02/09/2018  . Moderate persistent asthma 02/09/2018  . Allergic conjunctivitis 02/09/2018  . History of food allergy 02/09/2018  . Allergic reaction 02/09/2018  . Asthma exacerbation 08/22/2016  . RA (rheumatoid arthritis) ([place]) 08/22/2016  . HTN (hypertension) 08/22/2016    Past Surgical History:  Procedure Laterality Date  . CATARACT EXTRACTION    . CHOLECYSTECTOMY    . THYROIDECTOMY, PARTIAL      Prior to Admission medications   Medication Sig Start Date End Date Taking? Authorizing Provider  albuterol (ACCUNEB) 1.25 MG/3ML nebulizer solution Take 1 ampule by nebulization every 6 (six) hours as needed for wheezing.    [provider]  albuterol (PROVENTIL HFA;VENTOLIN HFA) 108 (90 Base) MCG/ACT inhaler Inhale 2 puffs into the lungs every 6 (six) hours as needed for wheezing or shortness of breath. 08/22/16   Nance Pear, MD  aspirin EC 81 MG tablet Take 81 mg by mouth daily.    [provider]  azelastine (ASTELIN) 0.1 % nasal spray Place 2 sprays into both nostrils 2 (two) times daily. 02/09/18   Bobbitt, Sed[place] Muta, MD  budesonide-formoterol [facility]) 160-4.5 MCG/ACT inhaler Inhale 2 puffs into the lungs 2 (two) times daily.    [provider]  cetirizine (ZYRTEC) 10 MG tablet Take 10 mg by mouth daily.    [provider]  desloratadine (CLARINEX) 5 MG tablet Take 5 mg by mouth daily.    [provider]  docusate sodium (COLACE) 50 MG capsule Take 50 mg by mouth 2 (two) times daily.    [provider]  EPINEPHrine 0.3 mg/0.3 mL IJ SOAJ injection Use as directed for severe allergic reaction. 05/12/18   Bobbitt, Sedalia Muta, MD  fluticasone (FLONASE) 50 MCG/ACT nasal spray Place into both nostrils daily.    [provider]  guaiFENesin-dextromethorphan (ROBITUSSIN DM) 100-10 MG/5ML syrup Take 5 mLs by mouth every 4 (four) hours as needed for cough. 08/23/16   Dustin Flock, MD  ipratropium (ATROVENT) 0.02 % nebulizer solution Take 0.5 mg by nebulization 4 (four) times daily.    [provider]  leflunomide (ARAVA) 20 MG tablet Take 20 mg by mouth  daily.    [provider]  levothyroxine (SYNTHROID, LEVOTHROID) 88 MCG tablet Take 88 mcg by mouth daily before breakfast.    [provider]  lisinopril (PRINIVIL,ZESTRIL) 40 MG tablet Take 40 mg by mouth daily.    [provider]  montelukast (SINGULAIR) 10 MG tablet Take 10 mg by mouth at bedtime.    [provider]  olopatadine (PATANOL) 0.1 % ophthalmic solution Place 1 drop into both eyes 2 (two) times daily. 02/09/18   Bobbitt, Sed[place] Muta, MD  omeprazole  (PRILOSEC) 10 MG capsule Take 20 mg by mouth daily.    [provider]  oxyCODONE (ROXICODONE) 5 MG immediate release tablet Take 1 tablet (5 mg total) by mouth every 8 (eight) hours as needed. Patient not taking: Reported on 09/30/2018 08/18/18 08/18/19  Merlyn Lot, MD  oxyCODONE-acetaminophen (PERCOCET) 5-325 MG tablet Take 0.5 tablets by mouth every 8 (eight) hours as needed for severe pain. 10/14/18 10/14/19  Laban Emperor, PA-C  pantoprazole (PROTONIX) 40 MG tablet Take 1 tablet (40 mg total) by mouth daily. 10/17/18 10/17/19  Darel Hong, MD  potassium chloride (K-DUR) 10 MEQ tablet Take 1 tablet (10 mEq total) by mouth daily. 10/14/18   Laban Emperor, PA-C  pravastatin (PRAVACHOL) 40 MG tablet Take 40 mg by mouth daily.    [provider]  prednisoLONE 5 MG TABS tablet Take by mouth.    [provider]  predniSONE (DELTASONE) 10 MG tablet Take 6 tablets day 1, take 5 tablets day 2, take 4 tablets day 3, take 3 tablets day 4, take 2 tablets day 5, take 1 tablet day 6 10/14/18   Laban Emperor, PA-C  Prenatal Vit-Fe Fumarate-FA (MULTIVITAMIN-PRENATAL) 27-0.8 MG TABS tablet Take 1 tablet by mouth daily at 12 noon.    [provider]  tiotropium (SPIRIVA) 18 MCG inhalation capsule Place 18 mcg into inhaler and inhale daily.    [provider]  traMADol (ULTRAM) 50 MG tablet Take 100 mg by mouth as needed (for rheumatoid arthritis).    [provider]  verapamil (CALAN) 120 MG tablet Take 240 mg by mouth 2 (two) times daily.    [provider]    Allergies Abatacept; Codeine; Shellfish allergy; Latex; and Penicillins  Family History  Problem Relation Age of Onset  . Diabetes Mother   . Hypertension Mother   . Hyperlipidemia Mother   . Breast cancer Neg Hx   . Ovarian cancer Neg Hx   . Colon cancer Neg Hx     Social History Social History   Tobacco Use  . Smoking status: Never Smoker  . Smokeless tobacco: Never  Used  Substance Use Topics  . Alcohol use: No  . Drug use: No    Review of Systems Constitutional: No fever/chills Eyes: No visual changes. ENT: No sore throat. Cardiovascular: Positive for intermittent left chest pain. Respiratory: Positive for cough. Gastrointestinal: No abdominal pain.  No nausea, no vomiting.  No diarrhea.   Genitourinary: Negative for dysuria. Musculoskeletal: Negative for back pain. Skin: Negative for rash. Neurological: Negative for headaches, focal weakness or numbness.  ____________________________________________   PHYSICAL EXAM:  VITAL SIGNS: ED Triage Vitals  Enc Vitals Group     BP 11/26/18 1733 (!) 155/78     Pulse Rate 11/26/18 1733 79     Resp 11/26/18 1733 18     Temp 11/26/18 1733 97.9 F (36.6 C)     Temp Source 11/26/18 1733 Oral     SpO2 11/26/18 1733  95 %     Weight 11/26/18 1742 171 lb 15.3 oz (78 kg)     Height --      Head Circumference --      Peak Flow --      Pain Score 11/26/18 1742 0   Constitutional: Alert and oriented.  Eyes: Conjunctivae are normal.  ENT      Head: Normocephalic and atraumatic.      Nose: No congestion/rhinnorhea.      Mouth/Throat: Mucous membranes are moist.      Neck: No stridor. Hematological/Lymphatic/Immunilogical: No cervical lymphadenopathy. Cardiovascular: Normal rate, regular rhythm.  No murmurs, rubs, or gallops.  Respiratory: Normal respiratory effort without tachypnea nor retractions. Breath sounds are clear and equal bilaterally. No wheezes/rales/rhonchi. Gastrointestinal: Soft and non tender. No rebound. No guarding.  Genitourinary: Deferred Musculoskeletal: Normal range of motion in all extremities.  Neurologic:  Normal speech and language. No gross focal neurologic deficits are appreciated.  Skin:  Skin is warm, dry and intact. No rash noted. Psychiatric: Mood and affect are normal. Speech and behavior are normal. Patient exhibits appropriate insight and  judgment.  ____________________________________________    LABS (pertinent positives/negatives)  Trop <0.03 CBC wbc 7.0, hgb 10.3, plt 266 BMP wnl except glu 160  ____________________________________________   EKG  I, Nance Pear, attending physician, personally viewed and interpreted this EKG  EKG Time: 1738 Rate: 78 Rhythm: normal sinus rhythm Axis: normal Intervals: qtc 460 QRS: low voltage, q waves v1, v2, v3 ST changes: no st elevation Impression: abnormal ekg   ____________________________________________    RADIOLOGY  CXR No acute disease   ____________________________________________   PROCEDURES  Procedures  ____________________________________________   INITIAL IMPRESSION / ASSESSMENT AND PLAN / ED COURSE  Pertinent labs & imaging results that were available during my care of the patient were reviewed by me and considered in my medical decision making (see chart for details).   Patient presented to the emergency department from urgent care because of concerns for chest pain and occasional cough.  No evidence of ACS, pneumonia, pneumothorax, pleural effusions at this time.  Patient's anemia is in fact improving.  No significant lower extremity edema to suggest CHF and no edema on x-ray.  Discussed findings with patient.  Do think bronchitis or slight asthma exacerbation likely.  Will give patient steroid taper.  Discussed findings plan with patient.  ____________________________________________   FINAL CLINICAL IMPRESSION(S) / ED DIAGNOSES  Final diagnoses:  Cough  Chest pain, unspecified type  Bronchitis     Note: This dictation was prepared with Dragon dictation. Any transcriptional errors that result from this process are unintentional     Nance Pear, MD 11/26/18 2017

## 2018-11-26 NOTE — Discharge Instructions (Addendum)
Please seek medical attention for any high fevers, chest pain, shortness of breath, change in behavior, persistent vomiting, bloody stool or any other new or concerning symptoms.  

## 2018-11-26 NOTE — ED Notes (Signed)
Patient to stat desk in no acute distress asking about wait time. Patient given update on wait time. Patient verbalizes understanding.  

## 2018-12-06 DIAGNOSIS — R0789 Other chest pain: Secondary | ICD-10-CM | POA: Insufficient documentation

## 2018-12-06 DIAGNOSIS — I429 Cardiomyopathy, unspecified: Secondary | ICD-10-CM | POA: Insufficient documentation

## 2018-12-09 ENCOUNTER — Ambulatory Visit (INDEPENDENT_AMBULATORY_CARE_PROVIDER_SITE_OTHER): Payer: Non-veteran care | Admitting: Podiatry

## 2018-12-09 ENCOUNTER — Encounter: Payer: Self-pay | Admitting: Podiatry

## 2018-12-09 DIAGNOSIS — B351 Tinea unguium: Secondary | ICD-10-CM | POA: Diagnosis not present

## 2018-12-09 DIAGNOSIS — M79674 Pain in right toe(s): Secondary | ICD-10-CM | POA: Diagnosis not present

## 2018-12-09 DIAGNOSIS — M79675 Pain in left toe(s): Secondary | ICD-10-CM | POA: Diagnosis not present

## 2018-12-09 NOTE — Progress Notes (Signed)
This patient presents to the office with chief complaint of long thick nails and diabetic feet. She says she was diagnosed with diabetes but her HbA1C has improved and her medical doctor has taken her off diabetic medicine. This patient  says there  is  no pain and discomfort in her  feet.  This patient says there are long thick painful nails.  These nails are painful walking and wearing shoes.  Patient has no history of infection or drainage from both feet.  Patient is unable to  self treat his own nails . This patient presents  to the office today for treatment of the  long nails and a foot evaluation due to history of  Diabetes. 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  General Appearance  Alert, conversant and in no acute stress.  Vascular  Dorsalis pedis and posterior tibial  pulses are palpable  bilaterally.  Capillary return is within normal limits  bilaterally. Temperature is within normal limits  bilaterally.  Neurologic  Senn-Weinstein monofilament wire test within normal limits  bilaterally. Muscle power within normal limits bilaterally.  Nails Thick disfigured discolored nails with subungual debris  from hallux to fifth toes bilaterally. No evidence of bacterial infection or drainage bilaterally.  Orthopedic  No limitations of motion of motion feet .  No crepitus or effusions noted.  No bony pathology or digital deformities noted. Neuroma pain elicited on palpation 3rd interspace left foot.  Skin  normotropic skin with no porokeratosis noted bilaterally.  No signs of infections or ulcers noted.     Onychomycosis  Diabetes with no foot complications  IE  Debride nails x 10.  A diabetic foot exam was performed and there is no evidence of any vascular or neurologic pathology. Patient was told to wear powerstep insoles to limit her forefoot hypermobility and help decrease her neuroma pain.  If neuroma pain persists she should return for further  treatment.  RTC 3 months for preventative footcare services.   Gardiner Barefoot DPM

## 2018-12-30 ENCOUNTER — Ambulatory Visit (INDEPENDENT_AMBULATORY_CARE_PROVIDER_SITE_OTHER): Payer: Non-veteran care | Admitting: Podiatry

## 2018-12-30 ENCOUNTER — Encounter: Payer: Self-pay | Admitting: Podiatry

## 2018-12-30 DIAGNOSIS — D3610 Benign neoplasm of peripheral nerves and autonomic nervous system, unspecified: Secondary | ICD-10-CM | POA: Diagnosis not present

## 2018-12-30 NOTE — Progress Notes (Signed)
This patient presents to the office with her new diabetic shoes.  She presents to the office to purchase a pair of powerstep insoles to wear in her shoes.  This patient is diabetic and has been diagnosed as having neuromas by Dr.  Milinda Pointer.  She presents to the office for the powersteps to help control her hypermobility and neuroma pain.  General Appearance  Alert, conversant and in no acute stress.  Vascular  Dorsalis pedis and posterior tibial  pulses are palpable  bilaterally.  Capillary return is within normal limits  bilaterally. Temperature is within normal limits  bilaterally.  Neurologic  Senn-Weinstein monofilament wire test within normal limits  bilaterally. Muscle power within normal limits bilaterally.  Nails Thick disfigured discolored nails with subungual debris  from hallux to fifth toes bilaterally. No evidence of bacterial infection or drainage bilaterally.  Orthopedic  No limitations of motion of motion feet .  No crepitus or effusions noted.  No bony pathology or digital deformities noted. Neuroma pain elicited on palpation 3rd interspace B/L  Hypermobility forefoot  B/L.  Skin  normotropic skin with no porokeratosis noted bilaterally.  No signs of infections or ulcers noted.  Diabetes with no foot complications  Neuroma  B/L  Diabetes .    ROV.  Powersteps were purchased and found to fit in her new diabetic shoes.  RTC prn.   Gardiner Barefoot DPM

## 2019-04-05 ENCOUNTER — Encounter: Payer: Self-pay | Admitting: Obstetrics and Gynecology

## 2019-04-29 ENCOUNTER — Telehealth: Payer: Self-pay | Admitting: Obstetrics and Gynecology

## 2019-04-29 NOTE — Telephone Encounter (Signed)
The patient called and stated that she would like to speak with her nurse in regards to her experiencing bleeding with wiping for about a month now. The patient also stated that this morning after she urinated the toilet was full of blood as well. The patient is very concerned and requesting a call back today. Please advise.

## 2019-04-29 NOTE — Telephone Encounter (Signed)
Pt was called back to see the amount of bleeding she was having. Pt stated that she notice it more when she uses the bathroom when she urinate. Pt stated that when she wipes she is noticing light bleeding. Pt also stated that a few times she has noticed it in her underwear. Pt stated that the bleeding has been going on for a month now. Pt stated that her last cycles was over 20 years ago when she was 68 years old. Pt made an appointment today to be seen by DJE on Monday, May 02, 2019 at 1:30 pm.

## 2019-05-02 ENCOUNTER — Other Ambulatory Visit: Payer: Self-pay

## 2019-05-02 ENCOUNTER — Ambulatory Visit (INDEPENDENT_AMBULATORY_CARE_PROVIDER_SITE_OTHER): Payer: Medicare Other | Admitting: Obstetrics and Gynecology

## 2019-05-02 ENCOUNTER — Encounter: Payer: Self-pay | Admitting: Obstetrics and Gynecology

## 2019-05-02 VITALS — BP 150/78 | HR 69 | Ht 62.0 in | Wt 202.0 lb

## 2019-05-02 DIAGNOSIS — N950 Postmenopausal bleeding: Secondary | ICD-10-CM | POA: Diagnosis not present

## 2019-05-02 DIAGNOSIS — R319 Hematuria, unspecified: Secondary | ICD-10-CM

## 2019-05-02 LAB — POCT URINALYSIS DIPSTICK
Bilirubin, UA: NEGATIVE
Glucose, UA: NEGATIVE
Ketones, UA: NEGATIVE
Leukocytes, UA: NEGATIVE
Nitrite, UA: NEGATIVE
Protein, UA: POSITIVE — AB
Spec Grav, UA: 1.01 (ref 1.010–1.025)
Urobilinogen, UA: 0.2 E.U./dL
pH, UA: 7 (ref 5.0–8.0)

## 2019-05-02 NOTE — Addendum Note (Signed)
Addended by: Durwin Glaze on: 05/02/2019 02:24 PM   Modules accepted: Orders

## 2019-05-02 NOTE — Progress Notes (Signed)
Patient comes in today for GYN visit. She had some blood on toilet paper. This has been going on for about a month. She is having some pelvic pain with lower back pain.

## 2019-05-02 NOTE — Progress Notes (Signed)
HPI:      Sandra Fischer is a 68 y.o. G2P0020 who LMP was No LMP recorded. Patient is postmenopausal.  Subjective:   She presents today with complaint of intermittent vaginal bleeding for the last month.  She says it is mostly spotting and mostly noted when she wipes.  She reports that she has not had intercourse in over a month.  She denies urinary symptoms. Of significant note she does report a remote history of uterine fibroids with previous fibroidectomy.    Hx: The following portions of the patient's history were reviewed and updated as appropriate:             She  has a past medical history of Asthma, Asthma, Collagen vascular disease ([place]), Diabetes mellitus without complication ([place]), Environmental allergies, High cholesterol, Hypertension, RA (rheumatoid arthritis) ([place]), and Thyroid disease. She does not have any pertinent problems on file. She  has a past surgical history that includes Cholecystectomy; Cataract extraction; and Thyroidectomy, partial. Her family history includes Diabetes in her mother; Hyperlipidemia in her mother; Hypertension in her mother. She  reports that she has never smoked. She has never used smokeless tobacco. She reports that she does not drink alcohol or use drugs. She has a current medication list which includes the following prescription(s): albuterol, albuterol, aspirin ec, azelastine, budesonide-formoterol, cetirizine, desloratadine, docusate sodium, epinephrine, fluticasone, guaifenesin-dextromethorphan, ipratropium, leflunomide, levothyroxine, lisinopril, montelukast, olopatadine, omeprazole, pravastatin, prednisolone, multivitamin-prenatal, tiotropium, tramadol, and verapamil. She is allergic to abatacept; codeine; latex; shellfish allergy; and penicillins.       Review of Systems:  Review of Systems  Constitutional: Denied constitutional symptoms, night sweats, recent illness, fatigue, fever, insomnia and weight loss.  Eyes: Denied eye symptoms,  eye pain, photophobia, vision change and visual disturbance.  Ears/Nose/Throat/Neck: Denied ear, nose, throat or neck symptoms, hearing loss, nasal discharge, sinus congestion and sore throat.  Cardiovascular: Denied cardiovascular symptoms, arrhythmia, chest pain/pressure, edema, exercise intolerance, orthopnea and palpitations.  Respiratory: Denied pulmonary symptoms, asthma, pleuritic pain, productive sputum, cough, dyspnea and wheezing.  Gastrointestinal: Denied, gastro-esophageal reflux, melena, nausea and vomiting.  Genitourinary: See HPI for additional information.  Musculoskeletal: Denied musculoskeletal symptoms, stiffness, swelling, muscle weakness and myalgia.  Dermatologic: Denied dermatology symptoms, rash and scar.  Neurologic: Denied neurology symptoms, dizziness, headache, neck pain and syncope.  Psychiatric: Denied psychiatric symptoms, anxiety and depression.  Endocrine: Denied endocrine symptoms including hot flashes and night sweats.   Meds:   Current Outpatient Medications on File Prior to Visit  Medication Sig Dispense Refill  . albuterol (ACCUNEB) 1.25 MG/3ML nebulizer solution Take 1 ampule by nebulization every 6 (six) hours as needed for wheezing.    Marland Kitchen albuterol (PROVENTIL HFA;VENTOLIN HFA) 108 (90 Base) MCG/ACT inhaler Inhale 2 puffs into the lungs every 6 (six) hours as needed for wheezing or shortness of breath. 1 Inhaler 0  . aspirin EC 81 MG tablet Take 81 mg by mouth daily.    Marland Kitchen azelastine (ASTELIN) 0.1 % nasal spray Place 2 sprays into both nostrils 2 (two) times daily. 30 mL 5  . budesonide-formoterol (SYMBICORT) 160-4.5 MCG/ACT inhaler Inhale 2 puffs into the lungs 2 (two) times daily.    . cetirizine (ZYRTEC) 10 MG tablet Take 10 mg by mouth daily.    Marland Kitchen desloratadine (CLARINEX) 5 MG tablet Take 5 mg by mouth daily.    Marland Kitchen docusate sodium (COLACE) 50 MG capsule Take 50 mg by mouth 2 (two) times daily.    Marland Kitchen EPINEPHrine 0.3 mg/0.3 mL IJ SOAJ injection Use  as  directed for severe allergic reaction. 2 Device 1  . fluticasone (FLONASE) 50 MCG/ACT nasal spray Place into both nostrils daily.    Marland Kitchen guaiFENesin-dextromethorphan (ROBITUSSIN DM) 100-10 MG/5ML syrup Take 5 mLs by mouth every 4 (four) hours as needed for cough. 118 mL 0  . ipratropium (ATROVENT) 0.02 % nebulizer solution Take 0.5 mg by nebulization 4 (four) times daily.    Marland Kitchen leflunomide (ARAVA) 20 MG tablet Take 20 mg by mouth daily.    Marland Kitchen levothyroxine (SYNTHROID, LEVOTHROID) 88 MCG tablet Take 88 mcg by mouth daily before breakfast.    . lisinopril (PRINIVIL,ZESTRIL) 40 MG tablet Take 40 mg by mouth daily.    . montelukast (SINGULAIR) 10 MG tablet Take 10 mg by mouth at bedtime.    Marland Kitchen olopatadine (PATANOL) 0.1 % ophthalmic solution Place 1 drop into both eyes 2 (two) times daily. 5 mL 5  . omeprazole (PRILOSEC) 10 MG capsule Take 20 mg by mouth daily.    . pravastatin (PRAVACHOL) 40 MG tablet Take 40 mg by mouth daily.    . prednisoLONE 5 MG TABS tablet Take by mouth.    . Prenatal Vit-Fe Fumarate-FA (MULTIVITAMIN-PRENATAL) 27-0.8 MG TABS tablet Take 1 tablet by mouth daily at 12 noon.    . tiotropium (SPIRIVA) 18 MCG inhalation capsule Place 18 mcg into inhaler and inhale daily.    . traMADol (ULTRAM) 50 MG tablet Take 100 mg by mouth as needed (for rheumatoid arthritis).    . verapamil (CALAN) 120 MG tablet Take 240 mg by mouth 2 (two) times daily.     No current facility-administered medications on file prior to visit.     Objective:     Vitals:   05/02/19 1329  BP: (!) 150/78  Pulse: 69              Patient declines examination today.  Assessment:    G2P0020 Patient Active Problem List   Diagnosis Date Noted  . Cardiomyopathy ([place]) 12/06/2018  . Perennial and seasonal allergic rhinitis 02/09/2018  . Moderate persistent asthma 02/09/2018  . Allergic conjunctivitis 02/09/2018  . History of food allergy 02/09/2018  . Allergic reaction 02/09/2018  . Asthma exacerbation  08/22/2016  . RA (rheumatoid arthritis) ([place]) 08/22/2016  . HTN (hypertension) 08/22/2016     1. Hematuria, unspecified type   2. Postmenopausal bleeding     Likely vaginal atrophy based on amount of bleeding and no other symptoms.  Urethral caruncle also discussed- patient would like to check this at home by herself.   Plan:            1.  Pelvic ultrasound to measure endometrial thickness.  Patient has been made aware that endometrial biopsy is necessary should the endometrium be enlarged.  2.  Patient like to check her cell for urethral caruncle.  I described this to her in detail.  3.  If ultrasound reveals non-thickened endometrium consider vaginal estrogen for likely atrophic vaginitis. (Recommend exam) Orders Orders Placed This Encounter  Procedures  . US PELVIS (TRANSABDOMINAL ONLY)  . POCT urinalysis dipstick    No orders of the defined types were placed in this encounter.     F/U  Return for We will contact her with any abnormal test results. I spent 17 minutes involved in the care of this patient of which greater than 50% was spent discussing postmenopausal bleeding work-up and possible treatment scenarios, use of estrogen for vaginitis and urethral caruncle.  All questions answered please see above for additional  discussions  Finis Bud, M.D. 05/02/2019 1:57 PM

## 2019-05-04 ENCOUNTER — Telehealth: Payer: Self-pay

## 2019-05-04 LAB — URINE CULTURE: Organism ID, Bacteria: NO GROWTH

## 2019-05-04 NOTE — Telephone Encounter (Signed)
Coronavirus (COVID-19) Are you at risk?  Are you at risk for the Coronavirus (COVID-19)?  To be considered HIGH RISK for Coronavirus (COVID-19), you have to meet the following criteria:  . Traveled to [place], [place], [place], [place] or [place]; or in the [place] to [place], [place], [place], or [place]; and have fever, cough, and shortness of breath within the last 2 weeks of travel OR . Been in close contact with a person diagnosed with COVID-19 within the last 2 weeks and have fever, cough, and shortness of breath . IF YOU DO NOT MEET THESE CRITERIA, YOU ARE CONSIDERED LOW RISK FOR COVID-19.  What to do if you are HIGH RISK for COVID-19?  . If you are having a medical emergency, call 911. . Seek medical care right away. Before you go to a doctor's office, urgent care or emergency department, call ahead and tell them about your recent travel, contact with someone diagnosed with COVID-19, and your symptoms. You should receive instructions from your physician's office regarding next steps of care.  . When you arrive at healthcare provider, tell the healthcare staff immediately you have returned from visiting [place], [place], [place], [place] or [place]; or traveled in the [place] to [place], [place], [place], or [place]; in the last two weeks or you have been in close contact with a person diagnosed with COVID-19 in the last 2 weeks.   . Tell the health care staff about your symptoms: fever, cough and shortness of breath. . After you have been seen by a medical provider, you will be either: o Tested for (COVID-19) and discharged home on quarantine except to seek medical care if symptoms worsen, and asked to  - Stay home and avoid contact with others until you get your results (4-5 days)  - Avoid travel on public transportation if possible (such as bus, train, or airplane) or o Sent to the Emergency Department by EMS for evaluation, COVID-19 testing, and possible  admission depending on your condition and test results.  What to do if you are LOW RISK for COVID-19?  Reduce your risk of any infection by using the same precautions used for avoiding the common cold or flu:  . Wash your hands often with soap and warm water for at least 20 seconds.  If soap and water are not readily available, use an alcohol-based hand sanitizer with at least 60% alcohol.  . If coughing or sneezing, cover your mouth and nose by coughing or sneezing into the elbow areas of your shirt or coat, into a tissue or into your sleeve (not your hands). . Avoid shaking hands with others and consider head nods or verbal greetings only. . Avoid touching your eyes, nose, or mouth with unwashed hands.  . Avoid close contact with people who are sick. . Avoid places or events with large numbers of people in one location, like concerts or sporting events. . Carefully consider travel plans you have or are making. . If you are planning any travel outside or inside the [place], visit the CDC's Travelers' Health webpage for the latest health notices. . If you have some symptoms but not all symptoms, continue to monitor at home and seek medical attention if your symptoms worsen. . If you are having a medical emergency, call 911.   ADDITIONAL HEALTHCARE OPTIONS FOR PATIENTS  [facility] / e-Visit: [email or web address]         [facility]: [phone]  [facility]: [phone]                   [facility]: [phone]   Prescreened. Neg .cm 

## 2019-05-05 ENCOUNTER — Other Ambulatory Visit: Payer: Self-pay

## 2019-05-05 ENCOUNTER — Ambulatory Visit (INDEPENDENT_AMBULATORY_CARE_PROVIDER_SITE_OTHER): Payer: Medicare Other

## 2019-05-05 DIAGNOSIS — D252 Subserosal leiomyoma of uterus: Secondary | ICD-10-CM | POA: Diagnosis not present

## 2019-05-05 DIAGNOSIS — N950 Postmenopausal bleeding: Secondary | ICD-10-CM | POA: Diagnosis not present

## 2019-05-11 ENCOUNTER — Telehealth: Payer: Self-pay | Admitting: Obstetrics and Gynecology

## 2019-05-11 NOTE — Telephone Encounter (Signed)
Pt called to inform of test results and assisted with making an appointment for endo bx.

## 2019-05-11 NOTE — Telephone Encounter (Signed)
The patient called and stated that she is checking the status of her u/s results. The patient is requesting a call back with an update. Please advise.

## 2019-05-13 ENCOUNTER — Telehealth: Payer: Self-pay

## 2019-05-13 NOTE — Telephone Encounter (Signed)
Pt called and prescreened. Pt denies any symptoms related to COVID-19. Pt has face mask.     Coronavirus (COVID-19) Are you at risk?  Are you at risk for the Coronavirus (COVID-19)?  To be considered HIGH RISK for Coronavirus (COVID-19), you have to meet the following criteria:  . Traveled to [place], [place], [place], [place] or [place]; or in the [place] to [place], [place], [place], or [place]; and have fever, cough, and shortness of breath within the last 2 weeks of travel OR . Been in close contact with a person diagnosed with COVID-19 within the last 2 weeks and have fever, cough, and shortness of breath . IF YOU DO NOT MEET THESE CRITERIA, YOU ARE CONSIDERED LOW RISK FOR COVID-19.  What to do if you are HIGH RISK for COVID-19?  Marland Kitchen If you are having a medical emergency, call 911. . Seek medical care right away. Before you go to a doctor's office, urgent care or emergency department, call ahead and tell them about your recent travel, contact with someone diagnosed with COVID-19, and your symptoms. You should receive instructions from your physician's office regarding next steps of care.  . When you arrive at healthcare provider, tell the healthcare staff immediately you have returned from visiting [place], [place], [place], [place] or [place]; or traveled in the [place] to [place], [place], [place], or [place]; in the last two weeks or you have been in close contact with a person diagnosed with COVID-19 in the last 2 weeks.   . Tell the health care staff about your symptoms: fever, cough and shortness of breath. . After you have been seen by a medical provider, you will be either: o Tested for (COVID-19) and discharged home on quarantine except to seek medical care if symptoms worsen, and asked to  - Stay home and avoid contact with others until you get your results (4-5 days)  - Avoid travel on public transportation if possible (such as bus, train, or airplane)  or o Sent to the Emergency Department by EMS for evaluation, COVID-19 testing, and possible admission depending on your condition and test results.  What to do if you are LOW RISK for COVID-19?  Reduce your risk of any infection by using the same precautions used for avoiding the common cold or flu:  Marland Kitchen Wash your hands often with soap and warm water for at least 20 seconds.  If soap and water are not readily available, use an alcohol-based hand sanitizer with at least 60% alcohol.  . If coughing or sneezing, cover your mouth and nose by coughing or sneezing into the elbow areas of your shirt or coat, into a tissue or into your sleeve (not your hands). . Avoid shaking hands with others and consider head nods or verbal greetings only. . Avoid touching your eyes, nose, or mouth with unwashed hands.  . Avoid close contact with people who are sick. . Avoid places or events with large numbers of people in one location, like concerts or sporting events. . Carefully consider travel plans you have or are making. . If you are planning any travel outside or inside the [place], visit the CDC's Travelers' Health webpage for the latest health notices. . If you have some symptoms but not all symptoms, continue to monitor at home and seek medical attention if your symptoms worsen. . If you are having a medical emergency, call 911.   [facility] /  e-Visit: [email or web address]         [facility]: [facility]: [phone]                   [facility]: [phone]

## 2019-05-16 ENCOUNTER — Ambulatory Visit: Payer: Self-pay | Admitting: Obstetrics and Gynecology

## 2019-05-16 ENCOUNTER — Telehealth: Payer: Self-pay | Admitting: Obstetrics and Gynecology

## 2019-05-16 NOTE — Telephone Encounter (Signed)
The patient called and rescheduled her appointment due to bleeding and also requested a call back from her nurse. Please advise.

## 2019-05-16 NOTE — Telephone Encounter (Signed)
Patient is coming Friday for biopsy.

## 2019-05-20 ENCOUNTER — Other Ambulatory Visit: Payer: Self-pay

## 2019-05-20 ENCOUNTER — Other Ambulatory Visit (HOSPITAL_COMMUNITY): Admit: 2019-05-20 | Payer: Medicare Other

## 2019-05-20 ENCOUNTER — Encounter: Payer: Self-pay | Admitting: Obstetrics and Gynecology

## 2019-05-20 ENCOUNTER — Other Ambulatory Visit (HOSPITAL_COMMUNITY)
Admission: RE | Admit: 2019-05-20 | Discharge: 2019-05-20 | Disposition: A | Discharge status: Home / Self Care | Payer: Medicare Other | Source: Ambulatory Visit | Attending: Obstetrics and Gynecology | Admitting: Obstetrics and Gynecology

## 2019-05-20 ENCOUNTER — Ambulatory Visit (INDEPENDENT_AMBULATORY_CARE_PROVIDER_SITE_OTHER): Payer: Medicare Other | Admitting: Obstetrics and Gynecology

## 2019-05-20 VITALS — BP 122/74 | HR 89 | Ht 62.0 in | Wt 200.6 lb

## 2019-05-20 DIAGNOSIS — N950 Postmenopausal bleeding: Secondary | ICD-10-CM | POA: Insufficient documentation

## 2019-05-20 DIAGNOSIS — R9389 Abnormal findings on diagnostic imaging of other specified body structures: Secondary | ICD-10-CM | POA: Diagnosis present

## 2019-05-20 NOTE — Addendum Note (Signed)
Addended by: Durwin Glaze on: 05/20/2019 12:39 PM   Modules accepted: Orders

## 2019-05-20 NOTE — Progress Notes (Signed)
[id number] Patient comes I today for endometrium biopsy.

## 2019-05-20 NOTE — Progress Notes (Signed)
HPI:      [name] is a 68 y.o. G2P0020 who LMP was No LMP recorded. Patient is postmenopausal.  Subjective:   She presents today because she has been having postmenopausal bleeding.  She elected to have an ultrasound which revealed a thickened endometrial lining.  She states that her bleeding is intermittent but when it occurs can be very heavy for an hour or so.  She says sometimes it seems like it fills up the whole toilet bowl.  Of significant note patient reports that she has a history of uterine fibroids.    Hx: The following portions of the patient's history were reviewed and updated as appropriate:             She  has a past medical history of Asthma, Asthma, Collagen vascular disease ([place]), Diabetes mellitus without complication ([place]), Environmental allergies, High cholesterol, Hypertension, RA (rheumatoid arthritis) ([place]), and Thyroid disease. She does not have any pertinent problems on file. She  has a past surgical history that includes Cholecystectomy; Cataract extraction; and Thyroidectomy, partial. Her family history includes Diabetes in her mother; Hyperlipidemia in her mother; Hypertension in her mother. She  reports that she has never smoked. She has never used smokeless tobacco. She reports that she does not drink alcohol or use drugs. She has a current medication list which includes the following prescription(s): albuterol, albuterol, aspirin ec, azelastine, budesonide-formoterol, cetirizine, desloratadine, docusate sodium, epinephrine, fluticasone, guaifenesin-dextromethorphan, ipratropium, leflunomide, levothyroxine, lisinopril, montelukast, olopatadine, omeprazole, pravastatin, prednisolone, multivitamin-prenatal, tiotropium, tramadol, and verapamil. She is allergic to abatacept; codeine; latex; shellfish allergy; and penicillins.       Review of Systems:  Review of Systems  Constitutional: Denied constitutional symptoms, night sweats, recent illness, fatigue,  fever, insomnia and weight loss.  Eyes: Denied eye symptoms, eye pain, photophobia, vision change and visual disturbance.  Ears/Nose/Throat/Neck: Denied ear, nose, throat or neck symptoms, hearing loss, nasal discharge, sinus congestion and sore throat.  Cardiovascular: Denied cardiovascular symptoms, arrhythmia, chest pain/pressure, edema, exercise intolerance, orthopnea and palpitations.  Respiratory: Denied pulmonary symptoms, asthma, pleuritic pain, productive sputum, cough, dyspnea and wheezing.  Gastrointestinal: Denied, gastro-esophageal reflux, melena, nausea and vomiting.  Genitourinary:  Postmenopausal bleeding intermittent  Musculoskeletal: Denied musculoskeletal symptoms, stiffness, swelling, muscle weakness and myalgia.  Dermatologic: Denied dermatology symptoms, rash and scar.  Neurologic: Denied neurology symptoms, dizziness, headache, neck pain and syncope.  Psychiatric: Denied psychiatric symptoms, anxiety and depression.  Endocrine: Denied endocrine symptoms including hot flashes and night sweats.   Meds:   Current Outpatient Medications on File Prior to Visit  Medication Sig Dispense Refill  . albuterol (ACCUNEB) 1.25 MG/3ML nebulizer solution Take 1 ampule by nebulization every 6 (six) hours as needed for wheezing.    Marland Kitchen albuterol (PROVENTIL HFA;VENTOLIN HFA) 108 (90 Base) MCG/ACT inhaler Inhale 2 puffs into the lungs every 6 (six) hours as needed for wheezing or shortness of breath. 1 Inhaler 0  . aspirin EC 81 MG tablet Take 81 mg by mouth daily.    Marland Kitchen azelastine (ASTELIN) 0.1 % nasal spray Place 2 sprays into both nostrils 2 (two) times daily. 30 mL 5  . budesonide-formoterol (SYMBICORT) 160-4.5 MCG/ACT inhaler Inhale 2 puffs into the lungs 2 (two) times daily.    . cetirizine (ZYRTEC) 10 MG tablet Take 10 mg by mouth daily.    Marland Kitchen desloratadine (CLARINEX) 5 MG tablet Take 5 mg by mouth daily.    Marland Kitchen docusate sodium (COLACE) 50 MG capsule Take 50 mg by mouth 2 (two) times  daily.    Marland Kitchen EPINEPHrine 0.3 mg/0.3 mL IJ SOAJ injection Use as directed for severe allergic reaction. 2 Device 1  . fluticasone (FLONASE) 50 MCG/ACT nasal spray Place into both nostrils daily.    Marland Kitchen guaiFENesin-dextromethorphan (ROBITUSSIN DM) 100-10 MG/5ML syrup Take 5 mLs by mouth every 4 (four) hours as needed for cough. 118 mL 0  . ipratropium (ATROVENT) 0.02 % nebulizer solution Take 0.5 mg by nebulization 4 (four) times daily.    Marland Kitchen leflunomide (ARAVA) 20 MG tablet Take 20 mg by mouth daily.    Marland Kitchen levothyroxine (SYNTHROID, LEVOTHROID) 88 MCG tablet Take 88 mcg by mouth daily before breakfast.    . lisinopril (PRINIVIL,ZESTRIL) 40 MG tablet Take 40 mg by mouth daily.    . montelukast (SINGULAIR) 10 MG tablet Take 10 mg by mouth at bedtime.    Marland Kitchen olopatadine (PATANOL) 0.1 % ophthalmic solution Place 1 drop into both eyes 2 (two) times daily. 5 mL 5  . omeprazole (PRILOSEC) 10 MG capsule Take 20 mg by mouth daily.    . pravastatin (PRAVACHOL) 40 MG tablet Take 40 mg by mouth daily.    . prednisoLONE 5 MG TABS tablet Take by mouth.    . Prenatal Vit-Fe Fumarate-FA (MULTIVITAMIN-PRENATAL) 27-0.8 MG TABS tablet Take 1 tablet by mouth daily at 12 noon.    . tiotropium (SPIRIVA) 18 MCG inhalation capsule Place 18 mcg into inhaler and inhale daily.    . traMADol (ULTRAM) 50 MG tablet Take 100 mg by mouth as needed (for rheumatoid arthritis).    . verapamil (CALAN) 120 MG tablet Take 240 mg by mouth 2 (two) times daily.     No current facility-administered medications on file prior to visit.     Objective:     Vitals:   05/20/19 1127  BP: 122/74  Pulse: 89              Physical examination   Pelvic:   Vulva: Normal appearance.  No lesions.  Vagina: No lesions or abnormalities noted.  Support: Normal pelvic support.  Urethra No masses tenderness or scarring.  Meatus Normal size without lesions or prolapse.  Cervix: Normal appearance.  No lesions.  Anus: Normal exam.  No lesions.   Perineum: Normal exam.  No lesions.        Bimanual   Uterus: Normal size.  Non-tender.  Mobile.  AV.  Adnexae: No masses.  Non-tender to palpation.  Cul-de-sac: Negative for abnormality.   Endometrial Biopsy After discussion with the patient regarding her abnormal uterine bleeding I recommended that she proceed with an endometrial biopsy for further diagnosis. The risks, benefits, alternatives, and indications for an endometrial biopsy were discussed with the patient in detail. She understood the risks including infection, bleeding, cervical laceration and uterine perforation.  Verbal consent was obtained.   PROCEDURE NOTE:  Vacurette endometrial biopsy was performed using aseptic technique with iodine preparation.  The uterus was sounded to a length of 10 cm.  Adequate sampling was obtained with minimal blood loss.  The patient tolerated the procedure well.  Disposition will be pending pathology.  Immediately following the procedure the patient began to bleed profusely from the cervical loss.  Blood was coming out of the cervix and a constant stream.  This lasted for approximately 3 minutes and then resolved.  After observation there was no further bleeding.   Assessment:    G2P0020 Patient Active Problem List   Diagnosis Date Noted  . Cardiomyopathy ([place]) 12/06/2018  . Perennial and  seasonal allergic rhinitis 02/09/2018  . Moderate persistent asthma 02/09/2018  . Allergic conjunctivitis 02/09/2018  . History of food allergy 02/09/2018  . Allergic reaction 02/09/2018  . Asthma exacerbation 08/22/2016  . RA (rheumatoid arthritis) ([place]) 08/22/2016  . HTN (hypertension) 08/22/2016     1. Postmenopausal bleeding   2. Thickened endometrium        Plan:            1.  Await endometrial biopsy results and base management upon those results. Orders No orders of the defined types were placed in this encounter.   No orders of the defined types were placed in this encounter.      F/U  Return for We will contact her with any abnormal test results.  Finis Bud, M.D. 05/20/2019 11:52 AM

## 2019-05-24 ENCOUNTER — Telehealth: Payer: Self-pay | Admitting: Obstetrics and Gynecology

## 2019-05-24 MED ORDER — NORETHINDRONE ACETATE 5 MG PO TABS: 5.0000 mg | ORAL_TABLET | Freq: Two times a day (BID) | ORAL | 0 refills | Status: DC

## 2019-05-24 NOTE — Telephone Encounter (Signed)
Patient called questioning if there is anything she can do to stop her bleeding. She had a biopsy on 5/22. Thanks

## 2019-05-24 NOTE — Telephone Encounter (Signed)
Per Dr. Amalia Hailey sent in Sandra Fischer to the pharmacy. I notified patient.

## 2019-06-03 ENCOUNTER — Telehealth: Payer: Self-pay

## 2019-06-03 ENCOUNTER — Ambulatory Visit (INDEPENDENT_AMBULATORY_CARE_PROVIDER_SITE_OTHER): Payer: Medicare Other | Admitting: Obstetrics and Gynecology

## 2019-06-03 ENCOUNTER — Other Ambulatory Visit: Payer: Self-pay

## 2019-06-03 ENCOUNTER — Encounter: Payer: Self-pay | Admitting: Obstetrics and Gynecology

## 2019-06-03 VITALS — BP 127/75 | HR 66 | Wt 202.2 lb

## 2019-06-03 DIAGNOSIS — R9389 Abnormal findings on diagnostic imaging of other specified body structures: Secondary | ICD-10-CM

## 2019-06-03 DIAGNOSIS — N950 Postmenopausal bleeding: Secondary | ICD-10-CM

## 2019-06-03 DIAGNOSIS — C541 Malignant neoplasm of endometrium: Secondary | ICD-10-CM

## 2019-06-03 NOTE — Progress Notes (Signed)
HPI:      [name] is a 68 y.o. G2P0020 who LMP was No LMP recorded. Patient is postmenopausal.  Subjective:   She presents today to discuss her endometrial biopsy which reveals endometrial cancer.  She says her bleeding is much less than it was a few days ago.    Hx: The following portions of the patient's history were reviewed and updated as appropriate:             She  has a past medical history of Asthma, Asthma, Collagen vascular disease ([place]), Diabetes mellitus without complication ([place]), Environmental allergies, Fibromyalgia, High cholesterol, Hypertension, RA (rheumatoid arthritis) ([place]), and Thyroid disease. She does not have any pertinent problems on file. She  has a past surgical history that includes Cholecystectomy; Cataract extraction; Thyroidectomy, partial; and fibroids removed. Her family history includes Diabetes in her mother; Hyperlipidemia in her mother; Hypertension in her mother. She  reports that she has never smoked. She has never used smokeless tobacco. She reports that she does not drink alcohol or use drugs. She has a current medication list which includes the following prescription(s): albuterol, albuterol, aspirin ec, azelastine, budesonide-formoterol, calcium, cetirizine, cholecalciferol, desloratadine, docusate sodium, epinephrine, ferrous sulfate, fluticasone, guaifenesin-dextromethorphan, ipratropium, leflunomide, levothyroxine, lisinopril, metoprolol succinate, montelukast, norethindrone, olopatadine, omeprazole, pravastatin, prednisolone, multivitamin-prenatal, tiotropium, tramadol, and verapamil. She is allergic to abatacept; codeine; latex; shellfish allergy; and penicillins.       Review of Systems:  Review of Systems  Constitutional: Denied constitutional symptoms, night sweats, recent illness, fatigue, fever, insomnia and weight loss.  Eyes: Denied eye symptoms, eye pain, photophobia, vision change and visual disturbance.  Ears/Nose/Throat/Neck:  Denied ear, nose, throat or neck symptoms, hearing loss, nasal discharge, sinus congestion and sore throat.  Cardiovascular: Denied cardiovascular symptoms, arrhythmia, chest pain/pressure, edema, exercise intolerance, orthopnea and palpitations.  Respiratory: Denied pulmonary symptoms, asthma, pleuritic pain, productive sputum, cough, dyspnea and wheezing.  Gastrointestinal: Denied, gastro-esophageal reflux, melena, nausea and vomiting.  Genitourinary: See HPI for additional information.  Musculoskeletal: Denied musculoskeletal symptoms, stiffness, swelling, muscle weakness and myalgia.  Dermatologic: Denied dermatology symptoms, rash and scar.  Neurologic: Denied neurology symptoms, dizziness, headache, neck pain and syncope.  Psychiatric: Denied psychiatric symptoms, anxiety and depression.  Endocrine: Denied endocrine symptoms including hot flashes and night sweats.   Meds:   Current Outpatient Medications on File Prior to Visit  Medication Sig Dispense Refill  . albuterol (ACCUNEB) 1.25 MG/3ML nebulizer solution Take 1 ampule by nebulization every 6 (six) hours as needed for wheezing.    Marland Kitchen albuterol (PROVENTIL HFA;VENTOLIN HFA) 108 (90 Base) MCG/ACT inhaler Inhale 2 puffs into the lungs every 6 (six) hours as needed for wheezing or shortness of breath. 1 Inhaler 0  . aspirin EC 81 MG tablet Take 81 mg by mouth daily.    Marland Kitchen azelastine (ASTELIN) 0.1 % nasal spray Place 2 sprays into both nostrils 2 (two) times daily. 30 mL 5  . budesonide-formoterol (SYMBICORT) 160-4.5 MCG/ACT inhaler Inhale 2 puffs into the lungs 2 (two) times daily.    . cetirizine (ZYRTEC) 10 MG tablet Take 10 mg by mouth daily.    Marland Kitchen desloratadine (CLARINEX) 5 MG tablet Take 5 mg by mouth daily.    Marland Kitchen docusate sodium (COLACE) 50 MG capsule Take 50 mg by mouth 2 (two) times daily.    Marland Kitchen EPINEPHrine 0.3 mg/0.3 mL IJ SOAJ injection Use as directed for severe allergic reaction. 2 Device 1  . fluticasone (FLONASE) 50 MCG/ACT  nasal spray Place into both nostrils  daily.    . guaiFENesin-dextromethorphan (ROBITUSSIN DM) 100-10 MG/5ML syrup Take 5 mLs by mouth every 4 (four) hours as needed for cough. 118 mL 0  . ipratropium (ATROVENT) 0.02 % nebulizer solution Take 0.5 mg by nebulization 4 (four) times daily.    Marland Kitchen leflunomide (ARAVA) 20 MG tablet Take 20 mg by mouth daily.    Marland Kitchen levothyroxine (SYNTHROID, LEVOTHROID) 88 MCG tablet Take 88 mcg by mouth daily before breakfast.    . lisinopril (PRINIVIL,ZESTRIL) 40 MG tablet Take 40 mg by mouth daily.    . montelukast (SINGULAIR) 10 MG tablet Take 10 mg by mouth at bedtime.    . norethindrone (AYGESTIN) 5 MG tablet Take 1 tablet (5 mg total) by mouth 2 (two) times daily for 10 days. As directed 20 tablet 0  . olopatadine (PATANOL) 0.1 % ophthalmic solution Place 1 drop into both eyes 2 (two) times daily. 5 mL 5  . omeprazole (PRILOSEC) 10 MG capsule Take 20 mg by mouth daily.    . pravastatin (PRAVACHOL) 40 MG tablet Take 40 mg by mouth daily.    . prednisoLONE 5 MG TABS tablet Take by mouth.    . Prenatal Vit-Fe Fumarate-FA (MULTIVITAMIN-PRENATAL) 27-0.8 MG TABS tablet Take 1 tablet by mouth daily at 12 noon.    . tiotropium (SPIRIVA) 18 MCG inhalation capsule Place 18 mcg into inhaler and inhale daily.    . traMADol (ULTRAM) 50 MG tablet Take 100 mg by mouth as needed (for rheumatoid arthritis).    . verapamil (CALAN) 120 MG tablet Take 240 mg by mouth 2 (two) times daily.     No current facility-administered medications on file prior to visit.     Objective:     Vitals:   06/03/19 1127  BP: 127/75  Pulse: 66              Pathology results reviewed directly with the patient  Assessment:    G2P0020 Patient Active Problem List   Diagnosis Date Noted  . Cardiomyopathy ([place]) 12/06/2018  . Perennial and seasonal allergic rhinitis 02/09/2018  . Moderate persistent asthma 02/09/2018  . Allergic conjunctivitis 02/09/2018  . History of food allergy 02/09/2018   . Allergic reaction 02/09/2018  . Asthma exacerbation 08/22/2016  . RA (rheumatoid arthritis) ([place]) 08/22/2016  . HTN (hypertension) 08/22/2016     1. Endometrial cancer determined by uterine biopsy ([place])   2. Thickened endometrium   3. Postmenopausal bleeding        Plan:            1.  We discussed endometrial cancer in detail.  Possible future treatments including surgery and radiation discussed in some detail.  All questions answered referred to GYN oncology.  Orders Orders Placed This Encounter  Procedures  . Ambulatory referral to Oncology    No orders of the defined types were placed in this encounter.     F/U  No follow-ups on file. I spent 18 minutes involved in the care of this patient of which greater than 50% was spent discussing endometrial cancer and follow-up.  All questions answered  Finis Bud, M.D. 06/03/2019 12:35 PM

## 2019-06-03 NOTE — Telephone Encounter (Signed)
Referral received. Chart reviewed. Scheduled for 6/10 at 1400. Sandra Fischer has been notified and cancer center restrictions reviewed.

## 2019-06-03 NOTE — Progress Notes (Signed)
Patient comes in today for lab results.

## 2019-06-08 ENCOUNTER — Other Ambulatory Visit: Payer: Self-pay

## 2019-06-08 ENCOUNTER — Inpatient Hospital Stay: Payer: Medicare Other | Attending: Obstetrics and Gynecology | Admitting: Obstetrics and Gynecology

## 2019-06-08 ENCOUNTER — Inpatient Hospital Stay: Payer: Medicare Other

## 2019-06-08 VITALS — BP 141/77 | HR 59 | Temp 98.7°F | Ht 62.0 in | Wt 203.8 lb

## 2019-06-08 DIAGNOSIS — E039 Hypothyroidism, unspecified: Secondary | ICD-10-CM | POA: Diagnosis not present

## 2019-06-08 DIAGNOSIS — M069 Rheumatoid arthritis, unspecified: Secondary | ICD-10-CM | POA: Insufficient documentation

## 2019-06-08 DIAGNOSIS — Z7982 Long term (current) use of aspirin: Secondary | ICD-10-CM | POA: Diagnosis not present

## 2019-06-08 DIAGNOSIS — Z79899 Other long term (current) drug therapy: Secondary | ICD-10-CM | POA: Insufficient documentation

## 2019-06-08 DIAGNOSIS — M797 Fibromyalgia: Secondary | ICD-10-CM | POA: Insufficient documentation

## 2019-06-08 DIAGNOSIS — Z6837 Body mass index (BMI) 37.0-37.9, adult: Secondary | ICD-10-CM | POA: Diagnosis not present

## 2019-06-08 DIAGNOSIS — C541 Malignant neoplasm of endometrium: Secondary | ICD-10-CM | POA: Insufficient documentation

## 2019-06-08 DIAGNOSIS — E785 Hyperlipidemia, unspecified: Secondary | ICD-10-CM | POA: Insufficient documentation

## 2019-06-08 DIAGNOSIS — E7800 Pure hypercholesterolemia, unspecified: Secondary | ICD-10-CM | POA: Insufficient documentation

## 2019-06-08 DIAGNOSIS — J45909 Unspecified asthma, uncomplicated: Secondary | ICD-10-CM | POA: Insufficient documentation

## 2019-06-08 DIAGNOSIS — Z7952 Long term (current) use of systemic steroids: Secondary | ICD-10-CM | POA: Diagnosis not present

## 2019-06-08 DIAGNOSIS — E119 Type 2 diabetes mellitus without complications: Secondary | ICD-10-CM | POA: Diagnosis not present

## 2019-06-08 DIAGNOSIS — I10 Essential (primary) hypertension: Secondary | ICD-10-CM | POA: Diagnosis not present

## 2019-06-08 DIAGNOSIS — I429 Cardiomyopathy, unspecified: Secondary | ICD-10-CM | POA: Diagnosis not present

## 2019-06-08 DIAGNOSIS — E669 Obesity, unspecified: Secondary | ICD-10-CM | POA: Insufficient documentation

## 2019-06-08 LAB — COMPREHENSIVE METABOLIC PANEL
ALT: 12 U/L (ref 0–44)
AST: 17 U/L (ref 15–41)
Albumin: 3.8 g/dL (ref 3.5–5.0)
Alkaline Phosphatase: 65 U/L (ref 38–126)
Anion gap: 9 (ref 5–15)
BUN: 15 mg/dL (ref 8–23)
CO2: 26 mmol/L (ref 22–32)
Calcium: 9.5 mg/dL (ref 8.9–10.3)
Chloride: 103 mmol/L (ref 98–111)
Creatinine, Ser: 0.96 mg/dL (ref 0.44–1.00)
GFR calc Af Amer: >60 mL/min (ref >60)
GFR calc non Af Amer: >60 mL/min (ref >60)
Glucose, Bld: 117 mg/dL — ABNORMAL HIGH (ref 70–99)
Potassium: 4.3 mmol/L (ref 3.5–5.1)
Sodium: 138 mmol/L (ref 135–145)
Total Bilirubin: 0.5 mg/dL (ref 0.3–1.2)
Total Protein: 7.9 g/dL (ref 6.5–8.1)

## 2019-06-08 LAB — CBC WITH DIFFERENTIAL/PLATELET
Abs Immature Granulocytes: 0.11 10*3/uL — ABNORMAL HIGH (ref 0.00–0.07)
Basophils Absolute: 0 10*3/uL (ref 0.0–0.1)
Basophils Relative: 0 %
Eosinophils Absolute: 0.1 10*3/uL (ref 0.0–0.5)
Eosinophils Relative: 1 %
HCT: 36.4 % (ref 36.0–46.0)
Hemoglobin: 11.2 g/dL — ABNORMAL LOW (ref 12.0–15.0)
Immature Granulocytes: 1 %
Lymphocytes Relative: 11 %
Lymphs Abs: 1.3 10*3/uL (ref 0.7–4.0)
MCH: 26 pg (ref 26.0–34.0)
MCHC: 30.8 g/dL (ref 30.0–36.0)
MCV: 84.7 fL (ref 80.0–100.0)
Monocytes Absolute: 0.7 10*3/uL (ref 0.1–1.0)
Monocytes Relative: 6 %
Neutro Abs: 9 10*3/uL — ABNORMAL HIGH (ref 1.7–7.7)
Neutrophils Relative %: 81 %
Platelets: 258 10*3/uL (ref 150–400)
RBC: 4.3 MIL/uL (ref 3.87–5.11)
RDW: 15.4 % (ref 11.5–15.5)
WBC: 11.2 10*3/uL — ABNORMAL HIGH (ref 4.0–10.5)
nRBC: 0 % (ref 0.0–0.2)

## 2019-06-08 NOTE — Patient Instructions (Signed)
Uterine Cancer  Uterine cancer is an abnormal growth of cancer tissue (malignant tumor) in the uterus. Unlike noncancerous (benign) tumors, malignant tumors can spread to other parts of the body. Uterine cancer usually occurs after menopause. However, it may also occur around the time that menopause begins. The wall of the uterus has an inner layer of tissue (endometrium) and an outer layer of muscle tissue (myometrium). The most common type of uterine cancer begins in the endometrium (endometrial cancer). Cancer that begins in the myometrium (uterine sarcoma) is very rare. What are the causes? The exact cause of this condition is not known. What increases the risk? You are more likely to develop this condition if you:  Are older than 50.  Have an enlarged endometrium (endometrial hyperplasia).  Use hormone therapy.  Are severely overweight (obese).  Use the medicine tamoxifen.  You are white (Caucasian).  Cannot bear children (are infertile).  Have never been pregnant.  Started menstruating at an age younger than 12 years.  Are older than 52 and are still having menstrual periods.  Have a history of cancer of the ovaries, intestines, or colon or rectum (colorectal cancer).  Have a history of enlarged ovaries with small cysts (polycystic ovarian syndrome).  Have a family history of: ? Uterine cancer. ? Hereditary nonpolyposis colon cancer (HNPCC).  Have diabetes, high blood pressure, thyroid disease, or gallbladder disease.  Use long-term, high-dose birth control pills.  Have been exposed to radiation.  Smoke. What are the signs or symptoms? Symptoms of this condition include:  Abnormal vaginal bleeding or discharge. Bleeding may start as a watery, blood-streaked flow that gradually contains more blood. This is the most common symptom. If you experience abnormal vaginal bleeding, do not assume that it is part of menopause.  Vaginal bleeding after menopause.   Unexplained weight loss.  Bleeding between periods.  Urination that is difficult, painful, or more frequent than usual.  A lump (mass) in the vagina.  Pain, bloating, or fullness in the abdomen.  Pain in the pelvic area.  Pain during sex. How is this diagnosed? This condition may be diagnosed based on:  Your medical history and your symptoms.  A physical and pelvic exam. Your health care provider will feel your pelvis for any growths or enlarged lymph nodes.  Blood and urine tests.  Imaging tests, such as X-rays, CT scans, ultrasound, or MRIs.  A procedure in which a thin, flexible tube with a light and camera on the end is inserted through the vagina and used to look inside the uterus (hysteroscopy).  A Pap test to check for abnormal cells in the lower part of the uterus (cervix) and the upper vagina.  Removing a tissue sample (biopsy) from the uterine lining to check for cancer cells.  Dilation and curettage (D&C). This is a procedure that involves stretching (dilation) the cervix and scraping (curettage) the inside lining of the uterus to get a biopsy and check for cancer cells. Your cancer will be staged to determine its severity and extent. Staging is an assessment of:  The size of the tumor.  Whether the cancer has spread.  Where the cancer has spread. The stages of uterine cancer are as follows:  Stage I. The cancer is only found in the uterus.  Stage II. The cancer has spread to the cervix.  Stage III. The cancer has spread outside the uterus, but not outside the pelvis. The cancer may have spread to the lymph nodes in the pelvis. Lymph nodes are  part of your body's disease-fighting (immune) system. Lymph nodes are found in many locations in your body, including the neck, underarm, and groin.  Stage IV. The cancer has spread to other parts of the body, such as the bladder or rectum. How is this treated? This condition is often treated with surgery to remove:   The uterus, cervix, fallopian tubes, and ovaries (total hysterectomy).  The uterus and cervix (simple hysterectomy). The type of hysterectomy you will have depends on the extent of your cancer. Lymph nodes near the uterus may also be removed in some cases. Treatment may also include one or more of the following:  Chemotherapy. This uses medicines to kill the cancer cells and prevent their spread.  Radiation therapy. This uses high-energy rays to kill the cancer cells and prevent the spread of cancer.  Chemoradiation. This is a combination treatment that alternates chemotherapy with radiation treatments to enhance the way radiation works.  Brachytherapy. This involves placing radioactive materials inside the body where the cancer was removed.  Hormone therapy. This includes taking medicines that lower the levels of estrogen in the body. Follow these instructions at home: Activity  Return to your normal activities as told by your health care provider. Ask your health care provider what activities are safe for you.  Exercise regularly as told by your health care provider.  Do not drive or use heavy machinery while taking prescription pain medicine. General instructions  Take over-the-counter and prescription medicines only as told by your health care provider.  Maintain a healthy diet.  Work with your health care provider to: ? Manage any long-term (chronic) conditions you have, such as diabetes, high blood pressure, thyroid disease, or gallbladder disease. ? Manage any side effects of your treatment.  Do not use any products that contain nicotine or tobacco, such as cigarettes and e-cigarettes. If you need help quitting, ask your health care provider.  Consider joining a support group to help you cope with stress. Your health care provider may be able to recommend a local or online support group.  Keep all follow-up visits as told by your health care provider. This is important.  Where to find more information  American Cancer Society: https://www.cancer.[place] ([place]): https://www.cancer.gov Contact a health care provider if:  You have pain in your pelvis or abdomen that gets worse.  You cannot urinate.  You have abnormal bleeding.  You have a fever. Get help right away if:  You develop sudden or new severe symptoms, such as: ? Heavy bleeding. ? Severe weakness. ? Pain that is severe or does not get better with medicine. Summary  Uterine cancer is an abnormal growth of cancer tissue (malignant tumor) in the uterus. The most common type of uterine cancer begins in the endometrium (endometrial cancer).  This condition is often treated with surgery to remove the uterus, cervix, fallopian tubes, and ovaries (total hysterectomy) or the uterus and cervix (simple hysterectomy).  Work with your health care provider to manage any long-term (chronic) conditions you have, such as diabetes, high blood pressure, thyroid disease, or gallbladder disease.  Consider joining a support group to help you cope with stress. Your health care provider may be able to recommend a local or online support group. This information is not intended to replace advice given to you by your health care provider. Make sure you discuss any questions you have with your health care provider. Document Released: 12/15/2005 Document Revised: 12/12/2016 Document Reviewed: 12/12/2016 Elsevier Interactive Patient Education  2019 [place].

## 2019-06-08 NOTE — Progress Notes (Signed)
Gynecologic Oncology Consult Visit   Referring Provider: Dr. 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 G2P0 female (s/p laparotomy myomectomy for leiomyoma) who is seen in consultation from Dr. 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   She initially presented to Dr. Amalia Hailey as [phone] female with complaint of postmenopausal bleeding, intermittent, heavy at times.    Transabdominal ultrasound on 05/05/2019 demonstrated endometrium measuring 78mm. Uterus anteverted measuring 11.3 x 6.6 x 7.9 cm, heterogeous echo texture w/o evidence of focal masses. Within uterus multiple suspected fibroids measuring 7.2 x 4.8 x 6.6 cm and 2.6 x 1.7 x 3.1 cm. Ovaries not visualized. No adnexal masses. No free fluid in cul de sac.   Endometrial biopsy was performed on 05/20/2019 and demonstrated high-grade mixed endometrioid, predominantly, and serous carcinoma.  She complains of persistent bleeding and fatigue which she attributes to the bleeding. She presents today for management. She has significant medical issues including rheumatoid arthritis requiring chronic steroids (5-10 mg daily) for a long time (she does not know how long) and leflunomide (ARAVA). She also has undergone myomectomy for leiomyoma and abdominoplasty.  She also has a history of cardiac disease. She Cardioversion for SVT on 10/17/2018. Her cardiologist Dr. Saralyn Pilar. The patient presented to [facility] on 10/17/2018 for chest pain and shortness of breath, noted to be in SVT, converted to sinus rhythm with adenosine,in the setting of colitis, anemia with hemoglobin 8.5, and untreated hypothyroidism with TSH 25, which has since returned to normal range.2D echocardiogram revealed moderately reduced LV function with LVEF 35 to 40% with diffuse hypokinesis. The patient underwent Lexiscan Myoview on 01/19/2019, which revealed revealed LVEF 49%, a mild  fixed inferior wall defect, scar versus artifact, with no evidence of ischemia. She was last seen on 04/26/2019 by Cardiology [name]) with a plan to stay on her current medications and she was counseled about low sodium diet, DASH, and continuing hyperlipidemia medications.   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 She is married and provides care for her mother.     Problem List: Patient Active Problem List   Diagnosis Date Noted  . Fibromyalgia   . Cardiomyopathy ([place]) 12/06/2018  . Perennial and seasonal allergic rhinitis 02/09/2018  . Moderate persistent asthma 02/09/2018  . Allergic conjunctivitis 02/09/2018  . History of food allergy 02/09/2018  . Allergic reaction 02/09/2018  . Asthma exacerbation 08/22/2016  . RA (rheumatoid arthritis) ([place]) 08/22/2016  . HTN (hypertension) 08/22/2016    Past Medical History: Past Medical History:  Diagnosis Date  . Asthma   . Asthma   . Collagen vascular disease ([place])   . Diabetes mellitus without complication ([place])   . Environmental allergies   . Fibromyalgia   . High cholesterol   . Hypertension   . RA (rheumatoid arthritis) ([place])   . Thyroid disease     Past Surgical History: Past Surgical History:  Procedure Laterality Date  . CATARACT EXTRACTION    . CHOLECYSTECTOMY    . fibroids removed    . THYROIDECTOMY, PARTIAL      Past Gynecologic History:  As per HPI  OB History:  OB History  Gravida Para Term Preterm AB Living  2       2    SAB TAB Ectopic Multiple Live Births  2            # Outcome Date GA Lbr Len/2nd Weight Sex Delivery Anes  PTL Lv  2 SAB           1 SAB             Family History: Family History  Problem Relation Age of Onset  . Diabetes Mother   . Hypertension Mother   . Hyperlipidemia Mother   . Breast cancer Neg Hx   . Ovarian cancer Neg Hx   . Colon cancer Neg Hx     Social History: Social History   Socioeconomic History  . Marital status: Married    Spouse name: Not  on file  . Number of children: Not on file  . Years of education: Not on file  . Highest education level: Not on file  Occupational History  . Not on file  Social Needs  . Financial resource strain: Not on file  . Food insecurity:    Worry: Not on file    Inability: Not on file  . Transportation needs:    Medical: Not on file    Non-medical: Not on file  Tobacco Use  . Smoking status: Never Smoker  . Smokeless tobacco: Never Used  Substance and Sexual Activity  . Alcohol use: No  . Drug use: No  . Sexual activity: Yes    Birth control/protection: Post-menopausal  Lifestyle  . Physical activity:    Days per week: Not on file    Minutes per session: Not on file  . Stress: Not on file  Relationships  . Social connections:    Talks on phone: Not on file    Gets together: Not on file    Attends religious service: Not on file    Active member of club or organization: Not on file    Attends meetings of clubs or organizations: Not on file    Relationship status: Not on file  . Intimate partner violence:    Fear of current or ex partner: Not on file    Emotionally abused: Not on file    Physically abused: Not on file    Forced sexual activity: Not on file  Other Topics Concern  . Not on file  Social History Narrative  . Not on file    Allergies: Allergies  Allergen Reactions  . Abatacept Hives  . Codeine Hives, Nausea And Vomiting and Nausea Only  . Latex Itching and Hives  . Shellfish Allergy Swelling  . Penicillins Nausea And Vomiting    Current Medications: Current Outpatient Medications  Medication Sig Dispense Refill  . CALCIUM PO Take 1 tablet by mouth daily.    . Cholecalciferol (D3 ADULT PO) Take 1 capsule by mouth daily.    . Ferrous Sulfate (IRON PO) Take by mouth.    . metoprolol succinate (TOPROL-XL) 50 MG 24 hr tablet Take 50 mg by mouth daily. Take with or immediately following a meal.    . albuterol (ACCUNEB) 1.25 MG/3ML nebulizer solution Take 1  ampule by nebulization every 6 (six) hours as needed for wheezing.    Marland Kitchen albuterol (PROVENTIL HFA;VENTOLIN HFA) 108 (90 Base) MCG/ACT inhaler Inhale 2 puffs into the lungs every 6 (six) hours as needed for wheezing or shortness of breath. 1 Inhaler 0  . aspirin EC 81 MG tablet Take 81 mg by mouth daily.    Marland Kitchen azelastine (ASTELIN) 0.1 % nasal spray Place 2 sprays into both nostrils 2 (two) times daily. 30 mL 5  . budesonide-formoterol (SYMBICORT) 160-4.5 MCG/ACT inhaler Inhale 2 puffs into the lungs 2 (two) times daily.    Marland Kitchen  cetirizine (ZYRTEC) 10 MG tablet Take 10 mg by mouth daily.    Marland Kitchen desloratadine (CLARINEX) 5 MG tablet Take 5 mg by mouth daily.    Marland Kitchen docusate sodium (COLACE) 50 MG capsule Take 50 mg by mouth 2 (two) times daily.    Marland Kitchen EPINEPHrine 0.3 mg/0.3 mL IJ SOAJ injection Use as directed for severe allergic reaction. 2 Device 1  . fluticasone (FLONASE) 50 MCG/ACT nasal spray Place into both nostrils daily.    Marland Kitchen guaiFENesin-dextromethorphan (ROBITUSSIN DM) 100-10 MG/5ML syrup Take 5 mLs by mouth every 4 (four) hours as needed for cough. 118 mL 0  . ipratropium (ATROVENT) 0.02 % nebulizer solution Take 0.5 mg by nebulization 4 (four) times daily.    Marland Kitchen leflunomide (ARAVA) 20 MG tablet Take 20 mg by mouth daily.    Marland Kitchen levothyroxine (SYNTHROID, LEVOTHROID) 88 MCG tablet Take 88 mcg by mouth daily before breakfast.    . lisinopril (PRINIVIL,ZESTRIL) 40 MG tablet Take 40 mg by mouth daily.    . montelukast (SINGULAIR) 10 MG tablet Take 10 mg by mouth at bedtime.    . norethindrone (AYGESTIN) 5 MG tablet Take 1 tablet (5 mg total) by mouth 2 (two) times daily for 10 days. As directed 20 tablet 0  . olopatadine (PATANOL) 0.1 % ophthalmic solution Place 1 drop into both eyes 2 (two) times daily. 5 mL 5  . omeprazole (PRILOSEC) 10 MG capsule Take 20 mg by mouth daily.    . pravastatin (PRAVACHOL) 40 MG tablet Take 40 mg by mouth daily.    . prednisoLONE 5 MG TABS tablet Take by mouth.    . Prenatal  Vit-Fe Fumarate-FA (MULTIVITAMIN-PRENATAL) 27-0.8 MG TABS tablet Take 1 tablet by mouth daily at 12 noon.    . tiotropium (SPIRIVA) 18 MCG inhalation capsule Place 18 mcg into inhaler and inhale daily.    . traMADol (ULTRAM) 50 MG tablet Take 100 mg by mouth as needed (for rheumatoid arthritis).    . verapamil (CALAN) 120 MG tablet Take 240 mg by mouth 2 (two) times daily.     No current facility-administered medications for this visit.     Review of Systems General: negative for fevers, chills, fatigue, changes in sleep, changes in weight or appetite Skin: negative for changes in color, texture, moles or lesions Eyes: negative for changes in vision, pain, diplopia HEENT: negative for change in hearing, pain, discharge, tinnitus, vertigo, voice changes, sore throat, neck masses Breasts: negative for breast lumps Pulmonary: negative for dyspnea, orthopnea, productive cough Cardiac: negative for palpitations, syncope, pain, discomfort, pressure Gastrointestinal: negative for dysphagia, nausea, vomiting, jaundice, pain, constipation, diarrhea, hematemesis, hematochezia Genitourinary/Sexual: negative for dysuria, discharge, hesitancy, nocturia, retention, stones, infections, STD's, incontinence Ob/Gyn: negative for irregular bleeding, pain Musculoskeletal: negative for pain, stiffness, swelling, range of motion limitation Hematology: negative for easy bruising, bleeding Neurologic/Psych: negative for headaches, seizures, paralysis, weakness, tremor, change in gait, change in sensation, mood swings, depression, anxiety, change in memory   Objective:  Physical Examination:  BP (!) 141/77 (BP Location: Left Arm, Patient Position: Sitting)   Pulse (!) 59   Temp 98.7 F (37.1 C) (Tympanic)   Ht 5\' 2"  (1.575 m)   Wt 203 lb 12.8 oz (92.4 kg)   BMI 37.28 kg/m     ECOG Performance Status: 1 - Symptomatic but completely ambulatory  GENERAL: Patient is a well appearing female in no acute  distress HEENT:  Sclerae anicteric.  Oropharynx clear and moist. No ulcerations or evidence of oropharyngeal candidiasis. Neck  is supple.  NODES:  No cervical, supraclavicular, or axillary lymphadenopathy palpated.  LUNGS:  Clear to auscultation bilaterally.  No wheezes or rhonchi. HEART:  Regular rate and rhythm. No murmur appreciated. ABDOMEN:  Soft, nontender.  Positive, normoactive bowel sounds. No organomegaly palpated. MSK:  No focal spinal tenderness to palpation. Full range of motion bilaterally in the upper extremities. EXTREMITIES:  No peripheral edema.   SKIN:  Clear with no obvious rashes or skin changes. No nail dyscrasia. NEURO:  Nonfocal. Well oriented.  Appropriate affect.  Pelvic: Exam Chaperoned by RN EGBUS: no lesions Cervix: no lesions, nontender, mobile, parametria smooth.  Vagina: no lesions, no discharge or bleeding. Narrow and long vaginal vault.  Uterus: Unable to determine size from exam, nontender to palpation, possible anterior leiomyoma on the left. Unable to assess for mobility.  Adnexa: no palpable masses but limited by habitus. Rectovaginal: confirmatory  Lab Review Labs on site today: CBC and CMP ordered  Radiologic Imaging: CT scan C/A/P without contrast ordered    Assessment:  Sandra Fischer is a 68 y.o. female diagnosed with high grade mixed endometrioid and serous endometrial carcinoma with known history of leiomyoma.   Fatigue, assess for anemia  Medical co-morbidities complicating care: She has significant medical issues including rheumatoid arthritis requiring chronic steroids (5-10 mg daily) for a long time (she does not know how long) and leflunomide (ARAVA); HTN; Dilated cardiomyopathy; hyperlipidemia; multiple intra-abdominal surgeries (myomectomy for leiomyoma, cholecystectomy, and abdominoplasty) ; obesity Body mass index is 37.28 kg/m.   Plan:   Problem List Items Addressed This Visit    None    Visit Diagnoses    Endometrial  cancer ([place])    -  Primary   Relevant Orders   CT Abdomen Pelvis Wo Contrast   CT Chest Wo Contrast   CBC with Differential/Platelet   Comprehensive metabolic panel     A long discussion was held with the patient today about management endometrioid carcinoma of the endometrium.  We recommend that she undergo surgical treatment with hysterectomy and bilateral salpingoophorectomy via minimally invasive laparoscopic/robotic surgery. We discussed that given the uterine size and leiomyoma she may require laparotomy.  We discussed the mechanism of metastasis and spread of uterine cancer and surgical/pathologic factors that correlate with this risk.   We discussed the risks and benefits of surgical staging with pelvic washings, the use of sentinel node biopsy and intraoperative frozen section in guiding the extent of staging with possible pelvic and para-aortic lymph node sampling if needed. Given serous histology radiation and/or chemotherapy will probably be recommended postoperatively and we will base that decision on the final pathology.  Other options include radiation therapy and hormonal therapy, but there success rate for cure are substantially lower. However, the patient does have significant medical issues and we will need preop cardiology and anesthesia assessment for clearance for surgery.  If cleared plan either laparoscopic or robotic-assisted hysterectomy, bilateral salpingo-oophorectomy, SLN injection/mapping/biopsy, possible pelvic and para-aortic node dissection with Dr. Allen Norris at [facility] on 06/21/2019.   We also discussed postoperative recovery times as she provides care for her mother. She understands laparotomy may be needed and that length of hospital stay and recovery will be longer compared to minimally invasive procedure. Discussed that steroids increase wound healing and surgical complications. We has been advised to reduce her steroid dose to 5 mg daily. There does not appear to be a  surgical indication to stop leflunomide but we will check with our pharmacy colleagues.    We are  obtaining CBC and CMP. CT scan C/A/P without contrast (allergy) ordered to assess for mets and will also help determine uterine size and if MIS option appropriate; EKG and ECHO will be ordered. We will contact Dr. Saralyn Pilar to schedule an appointment.   Suggested return to clinic in  2-4 weeks after surgery for pathology review.   The patient's diagnosis, an outline of the further diagnostic and laboratory studies which will be required, the recommendation for surgery, and alternatives were discussed with her and her accompanying family members.  All questions were answered to their satisfaction.  A total of 110 minutes were spent with the patient/family today; >50% was spent in education, counseling and coordination of care for endometrial cancer.    Angeles Gaetana Michaelis, MD    CC:  Dr. Amalia Hailey

## 2019-06-08 NOTE — Progress Notes (Signed)
Duplicate note - delete

## 2019-06-13 ENCOUNTER — Other Ambulatory Visit: Payer: Self-pay

## 2019-06-13 ENCOUNTER — Ambulatory Visit
Admission: RE | Admit: 2019-06-13 | Discharge: 2019-06-13 | Disposition: A | Discharge status: Home / Self Care | Payer: Medicare Other | Source: Ambulatory Visit | Attending: Nurse Practitioner | Admitting: Nurse Practitioner

## 2019-06-13 DIAGNOSIS — C541 Malignant neoplasm of endometrium: Secondary | ICD-10-CM | POA: Diagnosis not present

## 2019-06-15 DIAGNOSIS — E039 Hypothyroidism, unspecified: Secondary | ICD-10-CM | POA: Insufficient documentation

## 2019-06-15 DIAGNOSIS — IMO0001 Reserved for inherently not codable concepts without codable children: Secondary | ICD-10-CM | POA: Insufficient documentation

## 2019-06-22 DIAGNOSIS — D62 Acute posthemorrhagic anemia: Secondary | ICD-10-CM | POA: Insufficient documentation

## 2019-07-04 ENCOUNTER — Other Ambulatory Visit: Payer: Self-pay | Admitting: Nurse Practitioner

## 2019-07-04 DIAGNOSIS — C541 Malignant neoplasm of endometrium: Secondary | ICD-10-CM

## 2019-07-06 ENCOUNTER — Encounter: Payer: Self-pay | Admitting: Obstetrics and Gynecology

## 2019-07-06 ENCOUNTER — Inpatient Hospital Stay: Payer: Medicare Other | Attending: Obstetrics and Gynecology | Admitting: Obstetrics and Gynecology

## 2019-07-06 ENCOUNTER — Telehealth: Payer: Self-pay | Admitting: *Deleted

## 2019-07-06 ENCOUNTER — Other Ambulatory Visit: Payer: Self-pay

## 2019-07-06 DIAGNOSIS — Z7982 Long term (current) use of aspirin: Secondary | ICD-10-CM | POA: Diagnosis not present

## 2019-07-06 DIAGNOSIS — C541 Malignant neoplasm of endometrium: Secondary | ICD-10-CM | POA: Diagnosis present

## 2019-07-06 DIAGNOSIS — I10 Essential (primary) hypertension: Secondary | ICD-10-CM | POA: Diagnosis not present

## 2019-07-06 DIAGNOSIS — Z7951 Long term (current) use of inhaled steroids: Secondary | ICD-10-CM | POA: Diagnosis not present

## 2019-07-06 DIAGNOSIS — N898 Other specified noninflammatory disorders of vagina: Secondary | ICD-10-CM

## 2019-07-06 DIAGNOSIS — M797 Fibromyalgia: Secondary | ICD-10-CM | POA: Diagnosis not present

## 2019-07-06 DIAGNOSIS — Z90722 Acquired absence of ovaries, bilateral: Secondary | ICD-10-CM | POA: Insufficient documentation

## 2019-07-06 DIAGNOSIS — Z90710 Acquired absence of both cervix and uterus: Secondary | ICD-10-CM | POA: Insufficient documentation

## 2019-07-06 DIAGNOSIS — M069 Rheumatoid arthritis, unspecified: Secondary | ICD-10-CM | POA: Insufficient documentation

## 2019-07-06 DIAGNOSIS — E119 Type 2 diabetes mellitus without complications: Secondary | ICD-10-CM | POA: Diagnosis not present

## 2019-07-06 DIAGNOSIS — E039 Hypothyroidism, unspecified: Secondary | ICD-10-CM | POA: Insufficient documentation

## 2019-07-06 DIAGNOSIS — E7800 Pure hypercholesterolemia, unspecified: Secondary | ICD-10-CM | POA: Insufficient documentation

## 2019-07-06 DIAGNOSIS — J4540 Moderate persistent asthma, uncomplicated: Secondary | ICD-10-CM | POA: Diagnosis not present

## 2019-07-06 DIAGNOSIS — Z79899 Other long term (current) drug therapy: Secondary | ICD-10-CM | POA: Diagnosis not present

## 2019-07-06 LAB — WET PREP, GENITAL
Clue Cells Wet Prep HPF POC: NONE SEEN
Sperm: NONE SEEN
Trich, Wet Prep: NONE SEEN
Yeast Wet Prep HPF POC: NONE SEEN

## 2019-07-06 MED ORDER — METRONIDAZOLE 500 MG PO TABS: 500.0000 mg | ORAL_TABLET | Freq: Two times a day (BID) | ORAL | 0 refills | Status: AC

## 2019-07-06 NOTE — Telephone Encounter (Signed)
Sandra and Sandra Fischer have been trying to get in touch with her. Her voicemail is full and they couldn't leave a message. I believe they have called multiple times unsuccessfully for the past 2 days. We could see her at 12 o'clock today or in [facility] on Friday. Thanks.

## 2019-07-06 NOTE — Telephone Encounter (Signed)
Attempted to call patient and got message that voice mail is full

## 2019-07-06 NOTE — Telephone Encounter (Signed)
I called patient again and she accepts 12 noon appointment for today

## 2019-07-06 NOTE — Progress Notes (Signed)
Gynecologic Oncology Consult Visit   Referring Provider: Dr. Amalia Hailey  Chief Complaint: Stage IIIC-1 serous endometrial cancer  Subjective:  Sandra Fischer is a 67 y.o. G36P0 female (s/p laparotomy myomectomy for leiomyoma), initially seen in consultation from Dr. 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  Fishy odor, but no discharge, itching, fever or pain.  She underwent TLH_BSO with Dr. Allen Norris on 06/21/2019   Tumor size 9.3 cm, invading 99% of myometrium (3.55 of 3.6cm), positive right external iliac sentinel lymph node. Negative washings. MSI and HER2 status pending.   Case was discussed at [facility] on 07/04/2019. Possible PORTEC3 regimen given subgroup analysis showing benefit in serous history vs clinical trial was discussed. HER2+ was ordered and pending   Gynecologic Oncology History:  She initially presented to Dr. Amalia Hailey as [phone] female with complaint of postmenopausal bleeding, intermittent, heavy at times.    Transabdominal ultrasound on 05/05/2019 demonstrated endometrium measuring 85m. Uterus anteverted measuring 11.3 x 6.6 x 7.9 cm, heterogeous echo texture w/o evidence of focal masses. Within uterus multiple suspected fibroids measuring 7.2 x 4.8 x 6.6 cm and 2.6 x 1.7 x 3.1 cm. Ovaries not visualized. No adnexal masses. No free fluid in cul de sac.   Endometrial biopsy was performed on 05/20/2019 and demonstrated high-grade mixed endometrioid, predominantly, and serous carcinoma.  She complains of persistent bleeding and fatigue which she attributes to the bleeding. She presents today for management. She has significant medical issues including rheumatoid arthritis requiring chronic steroids (5-10 mg daily) for a long time (she does not know how long) and leflunomide (ARAVA). She also has undergone myomectomy for leiomyoma and abdominoplasty.  She also has a history of  cardiac disease. She Cardioversion for SVT on 10/17/2018. Her cardiologist Dr. [name] [facility] chest pain and shortness of breath, noted to be in SVT, converted to sinus rhythm with adenosine,in the setting of colitis, anemia with hemoglobin 8.5, and untreated hypothyroidism with TSH 25, which has since returned to normal range.2D echocardiogram revealed moderately reduced LV function with LVEF 35 to 40% with diffuse hypokinesis. The patient underwent Lexiscan Myoview on 01/19/2019, which revealed revealed LVEF 49%, a mild fixed inferior wall defect, scar versus artifact, with no evidence of ischemia. She was last seen on 04/26/2019 by Cardiology ([name] with a plan to stay on her current medications and she was counseled about low sodium diet, DASH, and continuing hyperlipidemia medications.   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 She is married and provides care for her mother.     Problem List: Patient Active Problem List   Diagnosis Date Noted  . Fibromyalgia   . Cardiomyopathy ([place] 12/06/2018  . Perennial and seasonal allergic rhinitis 02/09/2018  . Moderate persistent asthma 02/09/2018  . Allergic conjunctivitis 02/09/2018  . History of food allergy 02/09/2018  . Allergic reaction 02/09/2018  . Asthma exacerbation 08/22/2016  . RA (rheumatoid arthritis) ([facility] 08/22/2016  . HTN (hypertension) 08/22/2016    Past Medical History: Past Medical History:  Diagnosis Date  . Asthma   . Asthma   . Collagen vascular disease ([place]   . Diabetes mellitus without complication ([place]   . Environmental allergies   . Fibromyalgia   . High cholesterol   . Hypertension   . RA (rheumatoid arthritis) ([place]   . Thyroid disease     Past Surgical History: Past Surgical History:  Procedure Laterality Date  . CATARACT EXTRACTION    . CHOLECYSTECTOMY    . fibroids removed    . THYROIDECTOMY, PARTIAL      Past Gynecologic History:  As  per HPI  OB History:  OB History  Gravida Para Term Preterm AB Living  2       2    SAB TAB Ectopic Multiple Live Births  2            # Outcome Date GA Lbr Len/2nd Weight Sex Delivery Anes PTL Lv  2 SAB           1 SAB             Family History: Family History  Problem Relation Age of Onset  . Diabetes Mother   . Hypertension Mother   . Hyperlipidemia Mother   . Breast cancer Neg Hx   . Ovarian cancer Neg Hx   . Colon cancer Neg Hx     Social History: Social History   Socioeconomic History  . Marital status: Married    Spouse name: Not on file  . Number of children: Not on file  . Years of education: Not on file  . Highest education level: Not on file  Occupational History  . Not on file  Social Needs  . Financial resource strain: Not on file  . Food insecurity    Worry: Not on file    Inability: Not on file  . Transportation needs    Medical: Not on file    Non-medical: Not on file  Tobacco Use  . Smoking status: Never Smoker  . Smokeless tobacco: Never Used  Substance and Sexual Activity  . Alcohol use: No  . Drug use: No  . Sexual activity: Yes    Birth control/protection: Post-menopausal  Lifestyle  . Physical activity    Days per week: Not on file    Minutes per session: Not on file  . Stress: Not on file  Relationships  . Social Herbalist on phone: Not on file    Gets together: Not on file    Attends religious service: Not on file    Active member of club or organization: Not on file    Attends meetings of clubs or organizations: Not on file    Relationship status: Not on file  . Intimate partner violence    Fear of current or ex partner: Not on file    Emotionally abused: Not on file    Physically abused: Not on file    Forced sexual activity: Not on file  Other Topics Concern  . Not on file  Social History Narrative  . Not on file    Allergies: Allergies  Allergen Reactions  . Abatacept Hives  . Codeine Hives, Nausea  And Vomiting and Nausea Only  . Latex Itching and Hives  . Shellfish Allergy Swelling  . Penicillins Nausea And Vomiting    Current Medications: Current Outpatient Medications  Medication Sig Dispense Refill  . albuterol (ACCUNEB) 1.25 MG/3ML nebulizer solution Take 1 ampule by nebulization every 6 (six) hours as needed for wheezing.    Marland Kitchen albuterol (PROVENTIL HFA;VENTOLIN HFA) 108 (90 Base) MCG/ACT inhaler Inhale 2 puffs into the lungs every 6 (six) hours as needed for wheezing or shortness of breath. 1 Inhaler 0  . aspirin EC 81 MG tablet Take 81 mg by mouth daily.    Marland Kitchen azelastine (ASTELIN) 0.1 % nasal spray Place 2 sprays into both  nostrils 2 (two) times daily. 30 mL 5  . budesonide-formoterol (SYMBICORT) 160-4.5 MCG/ACT inhaler Inhale 2 puffs into the lungs 2 (two) times daily.    Marland Kitchen CALCIUM PO Take 1 tablet by mouth daily.    . cetirizine (ZYRTEC) 10 MG tablet Take 10 mg by mouth daily.    . Cholecalciferol (D3 ADULT PO) Take 1 capsule by mouth daily.    Marland Kitchen desloratadine (CLARINEX) 5 MG tablet Take 5 mg by mouth daily.    Marland Kitchen docusate sodium (COLACE) 50 MG capsule Take 50 mg by mouth 2 (two) times daily.    Marland Kitchen EPINEPHrine 0.3 mg/0.3 mL IJ SOAJ injection Use as directed for severe allergic reaction. 2 Device 1  . Ferrous Sulfate (IRON PO) Take by mouth.    . fluticasone (FLONASE) 50 MCG/ACT nasal spray Place into both nostrils daily.    Marland Kitchen guaiFENesin-dextromethorphan (ROBITUSSIN DM) 100-10 MG/5ML syrup Take 5 mLs by mouth every 4 (four) hours as needed for cough. 118 mL 0  . ipratropium (ATROVENT) 0.02 % nebulizer solution Take 0.5 mg by nebulization 4 (four) times daily.    Marland Kitchen leflunomide (ARAVA) 20 MG tablet Take 20 mg by mouth daily.    Marland Kitchen levothyroxine (SYNTHROID, LEVOTHROID) 88 MCG tablet Take 88 mcg by mouth daily before breakfast.    . lisinopril (PRINIVIL,ZESTRIL) 40 MG tablet Take 40 mg by mouth daily.    . metoprolol succinate (TOPROL-XL) 50 MG 24 hr tablet Take 50 mg by mouth daily.  Take with or immediately following a meal.    . montelukast (SINGULAIR) 10 MG tablet Take 10 mg by mouth at bedtime.    . norethindrone (AYGESTIN) 5 MG tablet Take 1 tablet (5 mg total) by mouth 2 (two) times daily for 10 days. As directed 20 tablet 0  . olopatadine (PATANOL) 0.1 % ophthalmic solution Place 1 drop into both eyes 2 (two) times daily. 5 mL 5  . omeprazole (PRILOSEC) 10 MG capsule Take 20 mg by mouth daily.    . pravastatin (PRAVACHOL) 40 MG tablet Take 40 mg by mouth daily.    . prednisoLONE 5 MG TABS tablet Take by mouth.    . Prenatal Vit-Fe Fumarate-FA (MULTIVITAMIN-PRENATAL) 27-0.8 MG TABS tablet Take 1 tablet by mouth daily at 12 noon.    . tiotropium (SPIRIVA) 18 MCG inhalation capsule Place 18 mcg into inhaler and inhale daily.    . traMADol (ULTRAM) 50 MG tablet Take 100 mg by mouth as needed (for rheumatoid arthritis).    . verapamil (CALAN) 120 MG tablet Take 240 mg by mouth 2 (two) times daily.     No current facility-administered medications for this visit.     Review of Systems General: negative for fevers, chills, fatigue, changes in sleep, changes in weight or appetite Skin: negative for changes in color, texture, moles or lesions Eyes: negative for changes in vision, pain, diplopia HEENT: negative for change in hearing, pain, discharge, tinnitus, vertigo, voice changes, sore throat, neck masses Breasts: negative for breast lumps Pulmonary: negative for dyspnea, orthopnea, productive cough Cardiac: negative for palpitations, syncope, pain, discomfort, pressure Gastrointestinal: negative for dysphagia, nausea, vomiting, jaundice, pain, constipation, diarrhea, hematemesis, hematochezia Genitourinary/Sexual: negative for dysuria, discharge, hesitancy, nocturia, retention, stones, infections, STD's, incontinence Ob/Gyn: negative for irregular bleeding, pain Musculoskeletal: negative for pain, stiffness, swelling, range of motion limitation Hematology: negative for  easy bruising, bleeding Neurologic/Psych: negative for headaches, seizures, paralysis, weakness, tremor, change in gait, change in sensation, mood swings, depression, anxiety, change in memory  Objective:  Physical Examination:  There were no vitals taken for this visit.    ECOG Performance Status: 1 - Symptomatic but completely ambulatory  GENERAL: Patient is a well appearing female in no acute distress HEENT:  Sclerae anicteric.  Oropharynx clear and moist. No ulcerations or evidence of oropharyngeal candidiasis. Neck is supple.  NODES:  No cervical, supraclavicular, or axillary lymphadenopathy palpated.  LUNGS:  Clear to auscultation bilaterally.  No wheezes or rhonchi. HEART:  Regular rate and rhythm. No murmur appreciated. ABDOMEN:  Soft, nontender.  Positive, normoactive bowel sounds. No organomegaly palpated. Incisions healed well except for 1 cm open area in lower minilap incision.  This was cleaned and cauterized with silver nitrate.  MSK:  No focal spinal tenderness to palpation. Full range of motion bilaterally in the upper extremities. EXTREMITIES:  No peripheral edema.   SKIN:  Clear with no obvious rashes or skin changes. No nail dyscrasia. NEURO:  Nonfocal. Well oriented.  Appropriate affect.  Pelvic: Exam Chaperoned by RN EGBUS: no lesions Vagina: small amount of thin gray discharge with fishy odor.  No lesions or bleeding.      Assessment:  Sandra Fischer is a 68 y.o. 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 Underwent TLH/BSO, SLN biopsies 6/20 with minilaparotomy to remove large fibroid uterus.  Tumor size 9.3 cm, invading 99% of myometrium (3.55 of 3.6cm), positive right external iliac sentinel lymph node. Negative washings. MSI and HER2 status pending.  Vaginal odor with probable gardnerella infection.   Cuff healing well tough. Small area of minilap incision cauterized.   Medical co-morbidities complicating care: She  has significant medical issues including rheumatoid arthritis requiring chronic steroids (5-10 mg daily) for a long time (she does not know how long) and leflunomide (ARAVA); HTN; Dilated cardiomyopathy; hyperlipidemia; multiple intra-abdominal surgeries (myomectomy for leiomyoma, cholecystectomy, and abdominoplasty) ; obesity There is no height or weight on file to calculate BMI.   Plan:   Problem List Items Addressed This Visit    None    Visit Diagnoses    Vaginal discharge    -  Primary   Relevant Orders   Wet prep, genital (Completed)      Sent wet prep and will prescribe Flagyl  500 mg bid for 7 days for presumed vaginal gardnerella infection and odor.    Plan is for adjuvant chemoradiation with PORTEC 3 regimen at [facility].   Mellody Drown, MD   CC:  Dr. Amalia Hailey

## 2019-07-06 NOTE — Telephone Encounter (Signed)
Patient called reporting she smells and is tired of smelling it and would like a return call

## 2019-07-12 ENCOUNTER — Telehealth: Payer: Self-pay | Admitting: *Deleted

## 2019-07-12 NOTE — Telephone Encounter (Signed)
Called patient. She has last day of flagyl scheduled for tomorrow and feels her vaginal odor improved shortly after starting medication but then returned. Given her post-operative status, I will talk to Dr. Fransisca Connors for recommendation and reach back out to her. I advised her to continue antibiotics in the interim.

## 2019-07-12 NOTE — Telephone Encounter (Signed)
Patient has questions about the medicine she was given last week and requests a return call [phone]

## 2019-07-13 ENCOUNTER — Other Ambulatory Visit: Payer: Self-pay

## 2019-07-13 ENCOUNTER — Other Ambulatory Visit: Payer: Self-pay | Admitting: Nurse Practitioner

## 2019-07-14 ENCOUNTER — Telehealth: Payer: Self-pay | Admitting: *Deleted

## 2019-07-14 ENCOUNTER — Inpatient Hospital Stay (HOSPITAL_BASED_OUTPATIENT_CLINIC_OR_DEPARTMENT_OTHER): Payer: Medicare Other | Admitting: Oncology

## 2019-07-14 ENCOUNTER — Encounter: Payer: Self-pay | Admitting: Oncology

## 2019-07-14 ENCOUNTER — Other Ambulatory Visit: Payer: Self-pay | Admitting: *Deleted

## 2019-07-14 ENCOUNTER — Other Ambulatory Visit: Payer: Self-pay

## 2019-07-14 VITALS — BP 119/77 | HR 71 | Temp 97.7°F | Ht 62.0 in | Wt 193.0 lb

## 2019-07-14 DIAGNOSIS — I10 Essential (primary) hypertension: Secondary | ICD-10-CM

## 2019-07-14 DIAGNOSIS — C541 Malignant neoplasm of endometrium: Secondary | ICD-10-CM

## 2019-07-14 DIAGNOSIS — N898 Other specified noninflammatory disorders of vagina: Secondary | ICD-10-CM | POA: Diagnosis not present

## 2019-07-14 DIAGNOSIS — J4540 Moderate persistent asthma, uncomplicated: Secondary | ICD-10-CM

## 2019-07-14 DIAGNOSIS — Z90710 Acquired absence of both cervix and uterus: Secondary | ICD-10-CM

## 2019-07-14 DIAGNOSIS — D649 Anemia, unspecified: Secondary | ICD-10-CM | POA: Diagnosis not present

## 2019-07-14 DIAGNOSIS — Z7951 Long term (current) use of inhaled steroids: Secondary | ICD-10-CM

## 2019-07-14 DIAGNOSIS — M069 Rheumatoid arthritis, unspecified: Secondary | ICD-10-CM

## 2019-07-14 DIAGNOSIS — E039 Hypothyroidism, unspecified: Secondary | ICD-10-CM

## 2019-07-14 DIAGNOSIS — R911 Solitary pulmonary nodule: Secondary | ICD-10-CM

## 2019-07-14 DIAGNOSIS — Z79899 Other long term (current) drug therapy: Secondary | ICD-10-CM

## 2019-07-14 DIAGNOSIS — M797 Fibromyalgia: Secondary | ICD-10-CM

## 2019-07-14 DIAGNOSIS — Z90722 Acquired absence of ovaries, bilateral: Secondary | ICD-10-CM

## 2019-07-14 DIAGNOSIS — E7800 Pure hypercholesterolemia, unspecified: Secondary | ICD-10-CM

## 2019-07-14 DIAGNOSIS — Z7982 Long term (current) use of aspirin: Secondary | ICD-10-CM

## 2019-07-14 DIAGNOSIS — E119 Type 2 diabetes mellitus without complications: Secondary | ICD-10-CM

## 2019-07-14 MED ORDER — METRONIDAZOLE 0.75 % VA GEL: 1.0000 | Freq: Every day | VAGINAL | 0 refills | Status: AC

## 2019-07-14 NOTE — Progress Notes (Signed)
Patient here today for a new patient consult.

## 2019-07-14 NOTE — Telephone Encounter (Signed)
Spoke to patient via telephone per Beckey Rutter, NP's request, and informed her of the RX that was sent to [facility] for Metrogel (vaginal gel). Instructed patient to start using it tonight, 1 applicator full at bedtime x 5 days. Patient verbalized understanding.

## 2019-07-15 DIAGNOSIS — C541 Malignant neoplasm of endometrium: Secondary | ICD-10-CM | POA: Insufficient documentation

## 2019-07-15 NOTE — Progress Notes (Signed)
Hematology/Oncology Consult note [facility] Telephone:([phone] [phone]   Patient Care Team: Felipa Eth, MD as PCP - General (Internal Medicine) Clent Jacks, RN as Oncology Nurse Navigator  REFERRING PROVIDER: Verlon Au, NP  CHIEF COMPLAINTS/REASON FOR VISIT:  Evaluation of endometrial cancer HISTORY OF PRESENTING ILLNESS:   Sandra Fischer is a  68 y.o.  female with PMH listed below was seen in consultation at the request of  Verlon Au, NP  for evaluation of endometrial cancer Patient had TH/BSO sentinel lymph node biopsy by [facility] oncology Dr. Allen Norris on 06/21/2019. [facility] review was performed by me.  Tumor size 9.3 cm, invading 99% of myometrium [3.55 out of 3.6 cm], positive right external iliac sentinel lymph nodes.  Negative washing.  No LVI, serosa is uninvolved. 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 pT1bpN25m  FIGO stage IIIC1 MSI intact HER 2 negative.   Patient was seen by Dr. BFransisca Connorson 07/06/2019 due to vaginal odor. Patient presents for establishing care and discussion for adjuvant chemotherapy radiation.  Per Dr. BBlake Divinenote, [facility]  Portex 3 regimen was recommended given subgroup analysis showing benefit in the serous histology versus clinical trial. HER2 was ordered and pending results.   Patient has a history of SVT.  10/14/2018 2D echo reviewed moderately reduced LV function with LVEF 35 to 40% with diffuse hypokinesis.  Patient had Lexiscan Myoview on 01/19/2019.  LVEF 49%.  SPECT images revealed a mild fixed inferior wall defect, scar versus artifact.  No evidence of ischemia.  She follows up with cardiology Dr.Paraschos 04/26/2019 with a plan to stay on her current medication. 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 She is married and providing care for  her mother.  Patient reports feeling well at baseline today.  She has used Flagyl which helped with her odor. GYNONC LEda Keyshas called in prescription for MetroGel and she was advised to start tonight.    Review of Systems  Constitutional: Negative for appetite change, chills, fatigue and fever.  HENT:   Negative for hearing loss and voice change.   Eyes: Negative for eye problems.  Respiratory: Negative for chest tightness and cough.   Cardiovascular: Negative for chest pain.  Gastrointestinal: Negative for abdominal distention, abdominal pain and blood in stool.  Endocrine: Negative for hot flashes.  Genitourinary: Negative for difficulty urinating and frequency.        Vaginal odor  Musculoskeletal: Negative for arthralgias.  Skin: Negative for itching and rash.  Neurological: Negative for extremity weakness.  Hematological: Negative for adenopathy.  Psychiatric/Behavioral: Negative for confusion.    MEDICAL HISTORY:  Past Medical History:  Diagnosis Date  . Asthma   . Asthma   . Collagen vascular disease ([place]   . Diabetes mellitus without complication ([place]    History of Diabetes   . Environmental allergies   . Fibromyalgia   . High cholesterol   . Hypertension   . RA (rheumatoid arthritis) ([place]   . Thyroid disease     SURGICAL HISTORY: Past Surgical History:  Procedure Laterality Date  . CATARACT EXTRACTION    . CHOLECYSTECTOMY    . fibroids removed    . THYROIDECTOMY, PARTIAL    . tummy tuck      SOCIAL HISTORY: Social History   Socioeconomic History  . Marital status: Married    Spouse name: Not on file  .  Number of children: Not on file  . Years of education: Not on file  . Highest education level: Not on file  Occupational History  . Not on file  Social Needs  . Financial resource strain: Not on file  . Food insecurity    Worry: Not on file    Inability: Not on file  . Transportation needs    Medical: Not on file    Non-medical: Not  on file  Tobacco Use  . Smoking status: Never Smoker  . Smokeless tobacco: Never Used  Substance and Sexual Activity  . Alcohol use: No  . Drug use: No  . Sexual activity: Yes    Birth control/protection: Post-menopausal  Lifestyle  . Physical activity    Days per week: Not on file    Minutes per session: Not on file  . Stress: Not on file  Relationships  . Social Herbalist on phone: Not on file    Gets together: Not on file    Attends religious service: Not on file    Active member of club or organization: Not on file    Attends meetings of clubs or organizations: Not on file    Relationship status: Not on file  . Intimate partner violence    Fear of current or ex partner: Not on file    Emotionally abused: Not on file    Physically abused: Not on file    Forced sexual activity: Not on file  Other Topics Concern  . Not on file  Social History Narrative  . Not on file    FAMILY HISTORY: Family History  Problem Relation Age of Onset  . Diabetes Mother   . Hypertension Mother   . Hyperlipidemia Mother   . Dementia Mother   . Breast cancer Neg Hx   . Ovarian cancer Neg Hx   . Colon cancer Neg Hx     ALLERGIES:  is allergic to abatacept; codeine; latex; shellfish allergy; and penicillins.  MEDICATIONS:  Current Outpatient Medications  Medication Sig Dispense Refill  . albuterol (ACCUNEB) 1.25 MG/3ML nebulizer solution Take 1 ampule by nebulization every 6 (six) hours as needed for wheezing.    Marland Kitchen albuterol (PROVENTIL HFA;VENTOLIN HFA) 108 (90 Base) MCG/ACT inhaler Inhale 2 puffs into the lungs every 6 (six) hours as needed for wheezing or shortness of breath. 1 Inhaler 0  . aspirin EC 81 MG tablet Take 81 mg by mouth daily.    Marland Kitchen azelastine (ASTELIN) 0.1 % nasal spray Place 2 sprays into both nostrils 2 (two) times daily. 30 mL 5  . budesonide-formoterol (SYMBICORT) 160-4.5 MCG/ACT inhaler Inhale 2 puffs into the lungs 2 (two) times daily.    . calcium  carbonate (OS-CAL - DOSED IN MG OF ELEMENTAL CALCIUM) 1250 (500 Ca) MG tablet Take by mouth.    Marland Kitchen CALCIUM PO Take 1 tablet by mouth daily.    . cetirizine (ZYRTEC) 10 MG tablet Take 10 mg by mouth daily.    . Cholecalciferol (D3 ADULT PO) Take 1 capsule by mouth daily.    Marland Kitchen desloratadine (CLARINEX) 5 MG tablet Take 5 mg by mouth daily.    Marland Kitchen docusate sodium (COLACE) 50 MG capsule Take 50 mg by mouth 2 (two) times daily.    Marland Kitchen enoxaparin (LOVENOX) 40 MG/0.4ML injection Inject into the skin.    Marland Kitchen EPINEPHrine 0.3 mg/0.3 mL IJ SOAJ injection Use as directed for severe allergic reaction. 2 Device 1  . Ferrous Sulfate (IRON PO) Take  by mouth.    . fluticasone (FLONASE) 50 MCG/ACT nasal spray Place into both nostrils daily.    Marland Kitchen guaiFENesin-dextromethorphan (ROBITUSSIN DM) 100-10 MG/5ML syrup Take 5 mLs by mouth every 4 (four) hours as needed for cough. 118 mL 0  . ipratropium (ATROVENT) 0.02 % nebulizer solution Take 0.5 mg by nebulization 4 (four) times daily.    Marland Kitchen leflunomide (ARAVA) 20 MG tablet Take 20 mg by mouth daily.    Marland Kitchen levothyroxine (SYNTHROID, LEVOTHROID) 88 MCG tablet Take 88 mcg by mouth daily before breakfast.    . lisinopril (PRINIVIL,ZESTRIL) 40 MG tablet Take 40 mg by mouth daily.    . metoprolol succinate (TOPROL-XL) 50 MG 24 hr tablet Take 50 mg by mouth daily. Take with or immediately following a meal.    . montelukast (SINGULAIR) 10 MG tablet Take 10 mg by mouth at bedtime.    Marland Kitchen olopatadine (PATANOL) 0.1 % ophthalmic solution Place 1 drop into both eyes 2 (two) times daily. 5 mL 5  . omeprazole (PRILOSEC) 10 MG capsule Take 20 mg by mouth daily.    . pravastatin (PRAVACHOL) 40 MG tablet Take 40 mg by mouth daily.    . prednisoLONE 5 MG TABS tablet Take by mouth.    . predniSONE (DELTASONE) 10 MG tablet Alternate taking 10 mg (1 tablet) and 5 mg (1/2 tablet) every other day.    . Prenatal Vit-Fe Fumarate-FA (MULTIVITAMIN-PRENATAL) 27-0.8 MG TABS tablet Take 1 tablet by mouth daily  at 12 noon.    . senna-docusate (SENOKOT-S) 8.6-50 MG tablet Take by mouth.    . tiotropium (SPIRIVA) 18 MCG inhalation capsule Place 18 mcg into inhaler and inhale daily.    . traMADol (ULTRAM) 50 MG tablet Take 100 mg by mouth as needed (for rheumatoid arthritis).    . verapamil (CALAN) 120 MG tablet Take 240 mg by mouth 2 (two) times daily.    . metroNIDAZOLE (METROGEL VAGINAL) 0.75 % vaginal gel Place 1 Applicatorful vaginally at bedtime for 5 days. 50 g 0  . norethindrone (AYGESTIN) 5 MG tablet Take 1 tablet (5 mg total) by mouth 2 (two) times daily for 10 days. As directed 20 tablet 0   No current facility-administered medications for this visit.      PHYSICAL EXAMINATION: ECOG PERFORMANCE STATUS: 0 - Asymptomatic Vitals:   07/14/19 1115  BP: 119/77  Pulse: 71  Temp: 97.7 F (36.5 C)   Filed Weights   07/14/19 1115  Weight: 193 lb (87.5 kg)    Physical Exam Constitutional:      General: She is not in acute distress. HENT:     Head: Normocephalic and atraumatic.  Eyes:     General: No scleral icterus.    Pupils: Pupils are equal, round, and reactive to light.  Neck:     Musculoskeletal: Normal range of motion and neck supple.  Cardiovascular:     Rate and Rhythm: Normal rate and regular rhythm.     Heart sounds: Normal heart sounds.  Pulmonary:     Effort: Pulmonary effort is normal. No respiratory distress.     Breath sounds: No wheezing.  Abdominal:     General: Bowel sounds are normal. There is no distension.     Palpations: Abdomen is soft. There is no mass.     Tenderness: There is no abdominal tenderness.  Musculoskeletal: Normal range of motion.        General: No deformity.  Skin:    General: Skin is warm and dry.  Findings: No erythema or rash.  Neurological:     Mental Status: She is alert and oriented to person, place, and time.     Cranial Nerves: No cranial nerve deficit.     Coordination: Coordination normal.  Psychiatric:        Behavior:  Behavior normal.        Thought Content: Thought content normal.     LABORATORY DATA:  I have reviewed the data as listed Lab Results  Component Value Date   WBC 11.2 (H) 06/08/2019   HGB 11.2 (L) 06/08/2019   HCT 36.4 06/08/2019   MCV 84.7 06/08/2019   PLT 258 06/08/2019   Recent Labs    10/14/18 1319 10/17/18 0046 11/26/18 1746 06/08/19 1543  NA 137 134* 141 138  K 3.2* 3.9 3.7 4.3  CL 103 101 107 103  CO2 23 20* 26 26  GLUCOSE 108* 180* 160* 117*  BUN _0 CREATININE 0.76 1.22* 0.75 0.96  CALCIUM 8.8* 9.1 9.3 9.5  GFRNONAA >60 46* >60 >60  GFRAA >60 53* >60 >60  PROT 7.6 7.6  --  7.9  ALBUMIN 3.3* 3.3*  --  3.8  AST 19 44*  --  17  ALT 7 17  --  12  ALKPHOS 60 70  --  65  BILITOT 0.7 0.4  --  0.5   Iron/TIBC/Ferritin/ %Sat No results found for: IRON, TIBC, FERRITIN, IRONPCTSAT    RADIOGRAPHIC STUDIES: I have personally reviewed the radiological images as listed and agreed with the findings in the report. No results found. 06/13/2019 CT chest without contrast  Stable 4 mm groundglass pulmonary nodule of the left pulmonary apex.  Nonspecific. CT abdomen pelvis without contrast Bulky fibroid uterus.   ASSESSMENT & PLAN:  1. Endometrial cancer ([place])   2. Anemia, unspecified type    Image results was reviewed with patient. Pathology was reviewed and discussed with patient. 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 I had a lengthy discussion with the patient discussing the rationale of adjuvant treatments, to increase overall survival and recurrent free survival.  Patient adamantly declines chemotherapy or radiation. 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  She understands that she has high risk local and distant  recurrence due to high risk factors for endometrial cancer. Surveillance Physical examination every 3 to 6 months for 2 to 3 years then every 6 months for up to year 5 annually.  CT abdomen pelvis as clinically indicated.   Pre-Op CA125 is not available.   # Anemia, likely due to blood loss. Monitor. Repeat blood work in 3 months.  # left lung nodule, would follow up with repeat CT scan in 6 months.   All questions were answered. The patient knows to call the clinic with any problems questions or concerns.  cc Verlon Au, NP  Dr. Mellody Drown  Return of visit: to be determined . Thank you for this kind referral and the opportunity to participate in the care of this patient. A copy of today's note is routed to referring provider  Total face to face encounter time for this patient visit was 60 min. >50% of the time was  spent in counseling and coordination of care.    Earlie Server, MD, PhD Hematology Oncology [facility]  [place] [facility] [place]- [phone] 07/15/2019

## 2019-07-20 ENCOUNTER — Inpatient Hospital Stay (HOSPITAL_BASED_OUTPATIENT_CLINIC_OR_DEPARTMENT_OTHER): Payer: Medicare Other | Admitting: Obstetrics and Gynecology

## 2019-07-20 ENCOUNTER — Ambulatory Visit: Payer: No Typology Code available for payment source

## 2019-07-20 ENCOUNTER — Other Ambulatory Visit: Payer: Self-pay

## 2019-07-20 VITALS — BP 135/84 | HR 84 | Temp 98.7°F | Wt 191.7 lb

## 2019-07-20 DIAGNOSIS — Z7189 Other specified counseling: Secondary | ICD-10-CM | POA: Diagnosis not present

## 2019-07-20 DIAGNOSIS — Z90722 Acquired absence of ovaries, bilateral: Secondary | ICD-10-CM | POA: Diagnosis not present

## 2019-07-20 DIAGNOSIS — Z90710 Acquired absence of both cervix and uterus: Secondary | ICD-10-CM

## 2019-07-20 DIAGNOSIS — C541 Malignant neoplasm of endometrium: Secondary | ICD-10-CM

## 2019-07-20 NOTE — Progress Notes (Signed)
Gynecologic Oncology Interval Visit   Referring Provider: Dr. Amalia Hailey  Chief Complaint: Stage IIIC-1 serous endometrial cancer  Subjective:  Sandra Fischer is a 68 y.o. G9P0 female (s/p laparotomy myomectomy for leiomyoma), initially seen in consultation from Dr. Amalia Hailey, diagnosed with stage IIIC-1 serous endometrial cancer returns to clinic for discussion of management.   Based on pathology, adjuvant chemotherapy & radiation was recommended. She saw Dr. [name] on 07/14/2019 and lengthy discussion regarding rationale of adjuvant treatment. She declined therapy. She presents today for further evaluation and discussion of adjuvant therapy as well as wound check. She is doing well and meeting all recovery goals.    Gynecologic Oncology History:  She initially presented to Dr. Amalia Hailey as [phone] female with complaint of postmenopausal bleeding, intermittent, heavy at times.    Transabdominal ultrasound on 05/05/2019 demonstrated endometrium measuring 59m. Uterus anteverted measuring 11.3 x 6.6 x 7.9 cm, heterogeous echo texture w/o evidence of focal masses. Within uterus multiple suspected fibroids measuring 7.2 x 4.8 x 6.6 cm and 2.6 x 1.7 x 3.1 cm. Ovaries not visualized. No adnexal masses. No free fluid in cul de sac.   Endometrial biopsy was performed on 05/20/2019 and demonstrated high-grade mixed endometrioid, predominantly, and serous carcinoma.  She complains of persistent bleeding and fatigue which she attributes to the bleeding. She presents today for management. She has significant medical issues including rheumatoid arthritis requiring chronic steroids (5-10 mg daily) for a long time (she does not know how long) and leflunomide (ARAVA). She also has undergone myomectomy for leiomyoma and abdominoplasty.  She also has a history of cardiac disease. She Cardioversion for SVT on 10/17/2018. Her cardiologist Dr. [name] [facility] chest pain and shortness of breath,  noted to be in SVT, converted to sinus rhythm with adenosine,in the setting of colitis, anemia with hemoglobin 8.5, and untreated hypothyroidism with TSH 25, which has since returned to normal range.2D echocardiogram revealed moderately reduced LV function with LVEF 35 to 40% with diffuse hypokinesis. The patient underwent Lexiscan Myoview on 01/19/2019, which revealed revealed LVEF 49%, a mild fixed inferior wall defect, scar versus artifact, with no evidence of ischemia. She was last seen on 04/26/2019 by Cardiology ([name] with a plan to stay on her current medications and she was counseled about low sodium diet, DASH, and continuing hyperlipidemia medications.   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 She is married and provides care for her mother.   She underwent TLH_BSO with [name] 06/21/2019.   Tumor size 9.3 cm, invading 99% of myometrium (3.55 of 3.6 cm), positive right external iliac sentinel lymph node. Negative washings. MSS/MMR - Intact/normal. HER2 - negative at 28%/1+.   Case was discussed at [facility] 07/04/2019. Possible PORTEC3 regimen given subgroup analysis showing benefit in serous history vs clinical trial was discussed.   Problem List: Patient Active Problem List   Diagnosis Date Noted  . Goals of care, counseling/discussion 07/20/2019  . Endometrial cancer ([facility] 07/15/2019  . Fibromyalgia   . Cardiomyopathy ([facility] 12/06/2018  . Perennial and seasonal allergic rhinitis 02/09/2018  . Moderate persistent asthma 02/09/2018  . Allergic conjunctivitis 02/09/2018  . History of food allergy 02/09/2018  . Allergic reaction 02/09/2018  . Asthma exacerbation 08/22/2016  . RA (rheumatoid arthritis) ([place] 08/22/2016  . HTN (hypertension) 08/22/2016    Past Medical History: Past Medical History:  Diagnosis Date  . Asthma   . Asthma   . Collagen vascular disease ([facility]   .  Diabetes mellitus without complication ([place])    History of Diabetes   .  Environmental allergies   . Fibromyalgia   . High cholesterol   . Hypertension   . RA (rheumatoid arthritis) ([place])   . Thyroid disease     Past Surgical History: Past Surgical History:  Procedure Laterality Date  . CATARACT EXTRACTION    . CHOLECYSTECTOMY    . fibroids removed    . THYROIDECTOMY, PARTIAL    . tummy tuck      Past Gynecologic History:  As per HPI  OB History:  OB History  Gravida Para Term Preterm AB Living  2       2    SAB TAB Ectopic Multiple Live Births  2            # Outcome Date GA Lbr Len/2nd Weight Sex Delivery Anes PTL Lv  2 SAB           1 SAB             Family History: Family History  Problem Relation Age of Onset  . Diabetes Mother   . Hypertension Mother   . Hyperlipidemia Mother   . Dementia Mother   . Breast cancer Neg Hx   . Ovarian cancer Neg Hx   . Colon cancer Neg Hx     Social History: Social History   Socioeconomic History  . Marital status: Married    Spouse name: Not on file  . Number of children: Not on file  . Years of education: Not on file  . Highest education level: Not on file  Occupational History  . Not on file  Social Needs  . Financial resource strain: Not on file  . Food insecurity    Worry: Not on file    Inability: Not on file  . Transportation needs    Medical: Not on file    Non-medical: Not on file  Tobacco Use  . Smoking status: Never Smoker  . Smokeless tobacco: Never Used  Substance and Sexual Activity  . Alcohol use: No  . Drug use: No  . Sexual activity: Yes    Birth control/protection: Post-menopausal  Lifestyle  . Physical activity    Days per week: Not on file    Minutes per session: Not on file  . Stress: Not on file  Relationships  . Social Herbalist on phone: Not on file    Gets together: Not on file    Attends religious service: Not on file    Active member of club or organization: Not on file    Attends meetings of clubs or organizations: Not on file     Relationship status: Not on file  . Intimate partner violence    Fear of current or ex partner: Not on file    Emotionally abused: Not on file    Physically abused: Not on file    Forced sexual activity: Not on file  Other Topics Concern  . Not on file  Social History Narrative  . Not on file    Allergies: Allergies  Allergen Reactions  . Abatacept Hives  . Codeine Hives, Nausea And Vomiting and Nausea Only  . Latex Itching and Hives  . Shellfish Allergy Swelling  . Penicillins Nausea And Vomiting    Current Medications: Current Outpatient Medications  Medication Sig Dispense Refill  . albuterol (ACCUNEB) 1.25 MG/3ML nebulizer solution Take 1 ampule by nebulization every 6 (six) hours as needed for  wheezing.    Marland Kitchen albuterol (PROVENTIL HFA;VENTOLIN HFA) 108 (90 Base) MCG/ACT inhaler Inhale 2 puffs into the lungs every 6 (six) hours as needed for wheezing or shortness of breath. 1 Inhaler 0  . aspirin EC 81 MG tablet Take 81 mg by mouth daily.    Marland Kitchen azelastine (ASTELIN) 0.1 % nasal spray Place 2 sprays into both nostrils 2 (two) times daily. 30 mL 5  . budesonide-formoterol (SYMBICORT) 160-4.5 MCG/ACT inhaler Inhale 2 puffs into the lungs 2 (two) times daily.    . calcium carbonate (OS-CAL - DOSED IN MG OF ELEMENTAL CALCIUM) 1250 (500 Ca) MG tablet Take by mouth.    . cetirizine (ZYRTEC) 10 MG tablet Take 10 mg by mouth daily.    . Cholecalciferol (D3 ADULT PO) Take 1 capsule by mouth daily.    Marland Kitchen desloratadine (CLARINEX) 5 MG tablet Take 5 mg by mouth daily.    Marland Kitchen docusate sodium (COLACE) 50 MG capsule Take 50 mg by mouth 2 (two) times daily.    Marland Kitchen EPINEPHrine 0.3 mg/0.3 mL IJ SOAJ injection Use as directed for severe allergic reaction. 2 Device 1  . Ferrous Sulfate (IRON PO) Take by mouth.    . fluticasone (FLONASE) 50 MCG/ACT nasal spray Place into both nostrils daily.    Marland Kitchen guaiFENesin-dextromethorphan (ROBITUSSIN DM) 100-10 MG/5ML syrup Take 5 mLs by mouth every 4 (four) hours as  needed for cough. 118 mL 0  . ipratropium (ATROVENT) 0.02 % nebulizer solution Take 0.5 mg by nebulization 4 (four) times daily.    Marland Kitchen leflunomide (ARAVA) 20 MG tablet Take 20 mg by mouth daily.    Marland Kitchen levothyroxine (SYNTHROID, LEVOTHROID) 88 MCG tablet Take 88 mcg by mouth daily before breakfast.    . lisinopril (PRINIVIL,ZESTRIL) 40 MG tablet Take 40 mg by mouth daily.    . metoprolol succinate (TOPROL-XL) 50 MG 24 hr tablet Take 50 mg by mouth daily. Take with or immediately following a meal.    . montelukast (SINGULAIR) 10 MG tablet Take 10 mg by mouth at bedtime.    Marland Kitchen olopatadine (PATANOL) 0.1 % ophthalmic solution Place 1 drop into both eyes 2 (two) times daily. 5 mL 5  . omeprazole (PRILOSEC) 10 MG capsule Take 20 mg by mouth daily.    . pravastatin (PRAVACHOL) 40 MG tablet Take 40 mg by mouth daily.    . prednisoLONE 5 MG TABS tablet Take by mouth.    . predniSONE (DELTASONE) 10 MG tablet Alternate taking 10 mg (1 tablet) and 5 mg (1/2 tablet) every other day.    . Prenatal Vit-Fe Fumarate-FA (MULTIVITAMIN-PRENATAL) 27-0.8 MG TABS tablet Take 1 tablet by mouth daily at 12 noon.    . senna-docusate (SENOKOT-S) 8.6-50 MG tablet Take by mouth.    . tiotropium (SPIRIVA) 18 MCG inhalation capsule Place 18 mcg into inhaler and inhale daily.    . traMADol (ULTRAM) 50 MG tablet Take 100 mg by mouth as needed (for rheumatoid arthritis).    . verapamil (CALAN) 120 MG tablet Take 240 mg by mouth 2 (two) times daily.    Marland Kitchen CALCIUM PO Take 1 tablet by mouth daily.    . norethindrone (AYGESTIN) 5 MG tablet Take 1 tablet (5 mg total) by mouth 2 (two) times daily for 10 days. As directed 20 tablet 0   No current facility-administered medications for this visit.     Review of Systems General:  no complaints Skin: no complaints Eyes: no complaints HEENT: no complaints Breasts: no complaints Pulmonary: no  complaints Cardiac: no complaints Gastrointestinal: no complaints Genitourinary/Sexual: no  complaints Ob/Gyn: no complaints Musculoskeletal: no complaints Hematology: no complaints Neurologic/Psych: no complaints   Objective:  Physical Examination:  BP 135/84 (BP Location: Right Arm, Patient Position: Sitting)   Pulse 84   Temp 98.7 F (37.1 C) (Tympanic)   Wt 191 lb 11.2 oz (87 kg)   BMI 35.06 kg/m     ECOG Performance Status: 1 - Symptomatic but completely ambulatory   General appearance: alert, cooperative and appears stated age 86: ATNC Lymph node survey: non-palpable, axillary, inguinal, supraclavicular Cardiovascular: RRR Respiratory: B CTA. Abdomen: SNDNT. Incision all well healed. No hernias, no masses, no ascites.  Extremities:No edema Skin exam - Well healed incisions.  Neurological exam reveals alert, oriented, normal speech, no focal findings or movement disorder noted  Pelvic: deferred      Assessment:  Sandra Fischer is a 68 y.o. 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  Tumor size 9.3 cm, invading 99% of myometrium (3.55 of 3.6cm), positive right external iliac sentinel lymph nodes. Negative washings.   Pulmonary nodule, uncertain etiology.   Medical co-morbidities complicating care: She has significant medical issues including rheumatoid arthritis requiring chronic steroids (5-10 mg daily) for a long time (she does not know how long) and leflunomide (ARAVA); HTN; Dilated cardiomyopathy; hyperlipidemia; multiple intra-abdominal surgeries (myomectomy for leiomyoma, cholecystectomy, and abdominoplasty) ; obesity Body mass index is 35.06 kg/m.  Plan:   Problem List Items Addressed This Visit      Genitourinary   Endometrial cancer ([place]) - Primary   Relevant Orders   CT Abdomen Pelvis Wo Contrast   CT Chest Wo Contrast     Other   Goals of care, counseling/discussion        We  again recommended adjuvant chemotherapy with or without radiation. We also offered radiation alone which is not our recommendation. We discussed that she may have microscopic disease and her risk of disease progression is very high. With therapy her risk of recurrence is at least 50%. Even after reviewing this information she declines either treatment option. Quality of life is the most important thing for her and she has had family members and friends have bad side effects from chemotherapy and radiation. We respect her decision and will continue to provide care and recommended close surveillance. Given the pulmonary nodule we will repeat CT imaging C/A/P in 3 months. Thereafter we will continue to follow the pulmonary nodule, but if the abdomen and pelvis are normal we will only obtain imaging abdomen and pelvis if needed based on symptoms and exams. We discussed symptoms concerning for recurrence and asked her to contact us if she has any of these symptoms or other concerns. She agrees with this plan.   I personally had a face to face interaction and evaluated the patient jointly with the NP, Ms. Beckey Rutter.  I have reviewed her history and available records and have performed the key portions of the physical exam including general, focal neuro exam, abdominal exam, pelvic exam with my findings confirming those documented above by the APP.  I have discussed the case with the APP and the patient.  I agree with the above documentation, assessment and plan which was fully formulated by me.  Counseling was completed by me.   I personally saw the patient and performed a substantive portion of this encounter in  conjunction with the listed APP as documented above.  A total of 25 minutes were spent with the patient/family today; >50% was spent in education,goals of care counseling and coordination of care for endometrial cancer.   Angeles Gaetana Michaelis, MD      CC:  Dr. Amalia Hailey

## 2019-07-26 ENCOUNTER — Telehealth: Payer: Self-pay | Admitting: *Deleted

## 2019-07-26 NOTE — Telephone Encounter (Signed)
Returned patient's call. No answer. Voicemail full and unable to leave message.

## 2019-07-26 NOTE — Telephone Encounter (Signed)
Patient called asking that Lauren call her back as she has some questions and that she also has another infection. [phone]

## 2019-07-27 ENCOUNTER — Other Ambulatory Visit: Payer: Self-pay | Admitting: Nurse Practitioner

## 2019-07-27 MED ORDER — FLUCONAZOLE 150 MG PO TABS: ORAL_TABLET | ORAL | 0 refills | Status: DC

## 2019-07-27 NOTE — Progress Notes (Signed)
Spoke to patient. She has been taking prednisone for RA and feels that BV symptoms improved after intravaginal metrogel but now odor has returned, she has vaginal discharge, and is itching. Symptoms concerning for yeast. Will send prescription for diflucan. She will call clinic next week with update on her symptoms. If symptoms persist, recommend Symptom Management Clinic for evaluation.

## 2019-08-01 ENCOUNTER — Telehealth: Payer: Self-pay | Admitting: *Deleted

## 2019-08-01 NOTE — Telephone Encounter (Signed)
Returned patient's call. Symptoms improving, she has taken both doses of diflucan. Asked her to continue to monitor. If symptoms persist or worsen to please call clinic for appointment in Symptom Management Clinic.

## 2019-08-01 NOTE — Telephone Encounter (Signed)
Patient  called reporting that her symptoms have improved but not gone and thinks she may need another round of medicine. Please advise

## 2019-08-09 ENCOUNTER — Telehealth: Payer: Self-pay | Admitting: *Deleted

## 2019-08-09 NOTE — Telephone Encounter (Signed)
Symptom Management Clinic with me please

## 2019-08-09 NOTE — Telephone Encounter (Signed)
I spoke with patient who said she is unable to come in right now because EMS is there to take her mother to the ER. She said once she has her mother settled, she will call back to schedule an appointment.

## 2019-08-09 NOTE — Telephone Encounter (Signed)
Patient called reporting that her yeast infection has come back as of yesterday and that she can tell by the smell that is what it is. Please advise

## 2019-08-09 NOTE — Telephone Encounter (Signed)
I attempted to call patient and her voice mail is full. I will try again later

## 2019-08-22 ENCOUNTER — Telehealth: Payer: Self-pay | Admitting: *Deleted

## 2019-08-22 NOTE — Telephone Encounter (Signed)
Patient called stating that she still has the infection and wants to make an appointment to be seen for this. Please call her with appointment.

## 2019-08-22 NOTE — Telephone Encounter (Signed)
Hassan [facility]- please contact patient for appointment in symptom management clinic. If she prefers to see Dr. Fransisca Connors, please let me know as he does not have any availability this week.

## 2019-08-23 ENCOUNTER — Other Ambulatory Visit: Payer: Self-pay

## 2019-08-23 ENCOUNTER — Encounter: Payer: Self-pay | Admitting: Nurse Practitioner

## 2019-08-23 ENCOUNTER — Inpatient Hospital Stay: Payer: Medicare Other | Attending: Nurse Practitioner | Admitting: Nurse Practitioner

## 2019-08-23 VITALS — BP 127/79 | HR 78 | Temp 99.3°F | Resp 18 | Wt 191.0 lb

## 2019-08-23 DIAGNOSIS — C541 Malignant neoplasm of endometrium: Secondary | ICD-10-CM | POA: Insufficient documentation

## 2019-08-23 DIAGNOSIS — N898 Other specified noninflammatory disorders of vagina: Secondary | ICD-10-CM

## 2019-08-23 DIAGNOSIS — R8290 Unspecified abnormal findings in urine: Secondary | ICD-10-CM

## 2019-08-23 LAB — URINALYSIS, COMPLETE (UACMP) WITH MICROSCOPIC
Bilirubin Urine: NEGATIVE
Glucose, UA: NEGATIVE mg/dL
Hgb urine dipstick: NEGATIVE
Ketones, ur: NEGATIVE mg/dL
Nitrite: NEGATIVE
Protein, ur: 30 mg/dL — AB
Specific Gravity, Urine: 1.021 (ref 1.005–1.030)
pH: 5 (ref 5.0–8.0)

## 2019-08-23 LAB — WET PREP, GENITAL
Clue Cells Wet Prep HPF POC: NONE SEEN
Sperm: NONE SEEN
Trich, Wet Prep: NONE SEEN
Yeast Wet Prep HPF POC: NONE SEEN

## 2019-08-23 NOTE — Telephone Encounter (Signed)
Patient accepts appointment today for 3 PM due to having to get someone to sit with her mother

## 2019-08-23 NOTE — Progress Notes (Signed)
[facility] [place]  Telephone:([phone] [phone]  Patient Care Team: Felipa Eth, MD as PCP - General (Internal Medicine) Clent Jacks, RN as Oncology Nurse Navigator   Name of the patient: Sandra Fischer  [id number]  1951/08/08   Date of visit: 08/23/19  Diagnosis- Stage IIIC-1 serous endometrial cancer  Chief complaint/ Reason for visit-vaginal odor/possible infection  Heme/Onc history:  Oncology History   No history exists.    Interval history- Sandra Fischer, 68 year old female with above history endometrial cancer, presents to symptom management clinic for complaints of vaginal odor.  This has been a recurrent problem for several years and she has seen several providers in the past for symptoms. She describes malodor, slightly sour, that occurs intermittently, sometimes quite strong and she says that she and other people notice is.  She generally denies discharge but says she does have some intermittent white to clear thin discharge.  No vaginal bleeding or pain.  Says she still feels swollen since her surgery.  She has been using Monistat OTC intermittently but is not sure that it helps.  She says that symptoms impact her self-esteem and quality of life. She says that she has been in stores and hears people comment on the odor. Her grandmother also suffered from this. She does not douche, wear panty liners. She showers regularly, stays clean, does not wear underwear to bed, wears cotton underwear.   Chart review shows this problem has been going on for many years but most recently, she saw Dr. Amalia Hailey, GYN for symptoms.  She has been treated on numerous occasions with Diflucan, MetroGel, and clindamycin with short-lived improvement.  She previously took Valtrex suppression for remote history of herpes.  06/17/2018- wet prep- trace bacteria, clue cells present (negative for yeast, no odor, neg trich), pH < 4.5.  No clear  evidence of monilia vaginitis or BV.  UA negative. NuSwab Vaginitis Plus (VG+) was negative and findings not consistent with infection  06/30/2018-she again presented to Dr. Amalia Hailey for complaints of worsening vaginal odor.  Denied discharge. Wet prep- negative, pH <4.5.  No evidence of vaginitis or UTI.  She was presumptively treated with boric acid capsules in an attempt to maintain vaginal pH. Boric Acid Crystals- 600 mg vaginally at bedtimes for 21 days then 2 times per week for 2 months.   09/30/2018-3 presented to Dr. Amalia Hailey.  Says symptoms are 90% better and she felt symptoms were food dependent.  He questioned if urinary leakage may also be contributing.   07/06/2019- saw Dr. Fransisca Connors for post-op visit. Symptoms thought to be secondary to Gardnerella infection and she was prescribed Flagyl 500 mg twice daily for 7 days.  07/12/2019-symptoms improved for couple days after starting Flagyl then returned.  She was started on MetroGel  07/27/2019-symptoms improved briefly then returned.  Prescription sent for Diflucan  ECOG FS:1 - Symptomatic but completely ambulatory  Review of Systems General:  no complaints Skin: no complaints Eyes: no complaints HEENT: no complaints Breasts: no complaints Pulmonary: no complaints Cardiac: no complaints Gastrointestinal: no complaints Genitourinary/Sexual: no complaints Ob/Gyn: vaginal odor per hpi Musculoskeletal: no complaints Hematology: no complaints Neurologic/Psych: no complaints  Current treatment-surveillance  Allergies  Allergen Reactions  . Abatacept Hives  . Codeine Hives, Nausea And Vomiting and Nausea Only  . Latex Itching and Hives  . Shellfish Allergy Swelling  . Penicillins Nausea And Vomiting    Past Medical History:  Diagnosis Date  . Asthma   . Asthma   .  Collagen vascular disease ([place])   . Diabetes mellitus without complication ([place])    History of Diabetes   . Environmental allergies   . Fibromyalgia   . High  cholesterol   . Hypertension   . RA (rheumatoid arthritis) ([place])   . Thyroid disease     Past Surgical History:  Procedure Laterality Date  . CATARACT EXTRACTION    . CHOLECYSTECTOMY    . fibroids removed    . THYROIDECTOMY, PARTIAL    . tummy tuck      Social History   Socioeconomic History  . Marital status: Married    Spouse name: Not on file  . Number of children: Not on file  . Years of education: Not on file  . Highest education level: Not on file  Occupational History  . Not on file  Social Needs  . Financial resource strain: Not on file  . Food insecurity    Worry: Not on file    Inability: Not on file  . Transportation needs    Medical: Not on file    Non-medical: Not on file  Tobacco Use  . Smoking status: Never Smoker  . Smokeless tobacco: Never Used  Substance and Sexual Activity  . Alcohol use: No  . Drug use: No  . Sexual activity: Yes    Birth control/protection: Post-menopausal  Lifestyle  . Physical activity    Days per week: Not on file    Minutes per session: Not on file  . Stress: Not on file  Relationships  . Social Herbalist on phone: Not on file    Gets together: Not on file    Attends religious service: Not on file    Active member of club or organization: Not on file    Attends meetings of clubs or organizations: Not on file    Relationship status: Not on file  . Intimate partner violence    Fear of current or ex partner: Not on file    Emotionally abused: Not on file    Physically abused: Not on file    Forced sexual activity: Not on file  Other Topics Concern  . Not on file  Social History Narrative  . Not on file    Family History  Problem Relation Age of Onset  . Diabetes Mother   . Hypertension Mother   . Hyperlipidemia Mother   . Dementia Mother   . Breast cancer Neg Hx   . Ovarian cancer Neg Hx   . Colon cancer Neg Hx      Current Outpatient Medications:  .  albuterol (ACCUNEB) 1.25 MG/3ML  nebulizer solution, Take 1 ampule by nebulization every 6 (six) hours as needed for wheezing., Disp: , Rfl:  .  albuterol (PROVENTIL HFA;VENTOLIN HFA) 108 (90 Base) MCG/ACT inhaler, Inhale 2 puffs into the lungs every 6 (six) hours as needed for wheezing or shortness of breath., Disp: 1 Inhaler, Rfl: 0 .  aspirin EC 81 MG tablet, Take 81 mg by mouth daily., Disp: , Rfl:  .  azelastine (ASTELIN) 0.1 % nasal spray, Place 2 sprays into both nostrils 2 (two) times daily., Disp: 30 mL, Rfl: 5 .  budesonide-formoterol (SYMBICORT) 160-4.5 MCG/ACT inhaler, Inhale 2 puffs into the lungs 2 (two) times daily., Disp: , Rfl:  .  calcium carbonate (OS-CAL - DOSED IN MG OF ELEMENTAL CALCIUM) 1250 (500 Ca) MG tablet, Take by mouth., Disp: , Rfl:  .  CALCIUM PO, Take 1 tablet by mouth daily.,  Disp: , Rfl:  .  cetirizine (ZYRTEC) 10 MG tablet, Take 10 mg by mouth daily., Disp: , Rfl:  .  Cholecalciferol (D3 ADULT PO), Take 1 capsule by mouth daily., Disp: , Rfl:  .  desloratadine (CLARINEX) 5 MG tablet, Take 5 mg by mouth daily., Disp: , Rfl:  .  docusate sodium (COLACE) 50 MG capsule, Take 50 mg by mouth 2 (two) times daily., Disp: , Rfl:  .  EPINEPHrine 0.3 mg/0.3 mL IJ SOAJ injection, Use as directed for severe allergic reaction., Disp: 2 Device, Rfl: 1 .  Ferrous Sulfate (IRON PO), Take by mouth., Disp: , Rfl:  .  fluconazole (DIFLUCAN) 150 MG tablet, Take 1 tablet (150 mg) by mouth. Three days later, take second tablet., Disp: 2 tablet, Rfl: 0 .  fluticasone (FLONASE) 50 MCG/ACT nasal spray, Place into both nostrils daily., Disp: , Rfl:  .  guaiFENesin-dextromethorphan (ROBITUSSIN DM) 100-10 MG/5ML syrup, Take 5 mLs by mouth every 4 (four) hours as needed for cough., Disp: 118 mL, Rfl: 0 .  ipratropium (ATROVENT) 0.02 % nebulizer solution, Take 0.5 mg by nebulization 4 (four) times daily., Disp: , Rfl:  .  leflunomide (ARAVA) 20 MG tablet, Take 20 mg by mouth daily., Disp: , Rfl:  .  levothyroxine (SYNTHROID,  LEVOTHROID) 88 MCG tablet, Take 88 mcg by mouth daily before breakfast., Disp: , Rfl:  .  lisinopril (PRINIVIL,ZESTRIL) 40 MG tablet, Take 40 mg by mouth daily., Disp: , Rfl:  .  metoprolol succinate (TOPROL-XL) 50 MG 24 hr tablet, Take 50 mg by mouth daily. Take with or immediately following a meal., Disp: , Rfl:  .  montelukast (SINGULAIR) 10 MG tablet, Take 10 mg by mouth at bedtime., Disp: , Rfl:  .  norethindrone (AYGESTIN) 5 MG tablet, Take 1 tablet (5 mg total) by mouth 2 (two) times daily for 10 days. As directed, Disp: 20 tablet, Rfl: 0 .  olopatadine (PATANOL) 0.1 % ophthalmic solution, Place 1 drop into both eyes 2 (two) times daily., Disp: 5 mL, Rfl: 5 .  omeprazole (PRILOSEC) 10 MG capsule, Take 20 mg by mouth daily., Disp: , Rfl:  .  pravastatin (PRAVACHOL) 40 MG tablet, Take 40 mg by mouth daily., Disp: , Rfl:  .  prednisoLONE 5 MG TABS tablet, Take by mouth., Disp: , Rfl:  .  predniSONE (DELTASONE) 10 MG tablet, Alternate taking 10 mg (1 tablet) and 5 mg (1/2 tablet) every other day., Disp: , Rfl:  .  Prenatal Vit-Fe Fumarate-FA (MULTIVITAMIN-PRENATAL) 27-0.8 MG TABS tablet, Take 1 tablet by mouth daily at 12 noon., Disp: , Rfl:  .  senna-docusate (SENOKOT-S) 8.6-50 MG tablet, Take by mouth., Disp: , Rfl:  .  tiotropium (SPIRIVA) 18 MCG inhalation capsule, Place 18 mcg into inhaler and inhale daily., Disp: , Rfl:  .  traMADol (ULTRAM) 50 MG tablet, Take 100 mg by mouth as needed (for rheumatoid arthritis)., Disp: , Rfl:  .  verapamil (CALAN) 120 MG tablet, Take 240 mg by mouth 2 (two) times daily., Disp: , Rfl:   Physical exam:  Vitals:   08/23/19 1554  BP: 127/79  Pulse: 78  Resp: 18  Temp: 99.3 F (37.4 C)  TempSrc: Tympanic  Weight: 191 lb (86.6 kg)   Physical Exam Exam conducted with a chaperone present.  Constitutional:      General: She is not in acute distress.    Appearance: Normal appearance. She is obese.  HENT:     Mouth/Throat:     Mouth: Mucous  membranes  are moist.     Pharynx: Oropharynx is clear.  Eyes:     General: No scleral icterus.    Conjunctiva/sclera: Conjunctivae normal.  Cardiovascular:     Rate and Rhythm: Normal rate and regular rhythm.  Pulmonary:     Effort: Pulmonary effort is normal.     Breath sounds: Normal breath sounds.  Abdominal:     General: There is no distension.     Tenderness: There is no abdominal tenderness. There is no right CVA tenderness or left CVA tenderness.  Genitourinary:    General: Normal vulva.     Pubic Area: No rash.      Tanner stage (genital): 5.     Labia:        Right: No lesion.        Left: No lesion.      Vagina: Vaginal discharge (thin, white, vaginal discharge; no malodor detected) present. No erythema.     Comments: Bimanual deferred.  Musculoskeletal:        General: No swelling or tenderness.  Skin:    General: Skin is warm and dry.  Neurological:     Mental Status: She is alert.      CMP Latest Ref Rng & Units 06/08/2019  Glucose 70 - 99 mg/dL 117(H)  BUN 8 - 23 mg/dL 15  Creatinine 0.44 - 1.00 mg/dL 0.96  Sodium 135 - 145 mmol/L 138  Potassium 3.5 - 5.1 mmol/L 4.3  Chloride 98 - 111 mmol/L 103  CO2 22 - 32 mmol/L 26  Calcium 8.9 - 10.3 mg/dL 9.5  Total Protein 6.5 - 8.1 g/dL 7.9  Total Bilirubin 0.3 - 1.2 mg/dL 0.5  Alkaline Phos 38 - 126 U/L 65  AST 15 - 41 U/L 17  ALT 0 - 44 U/L 12   CBC Latest Ref Rng & Units 06/08/2019  WBC 4.0 - 10.5 K/uL 11.2(H)  Hemoglobin 12.0 - 15.0 g/dL 11.2(L)  Hematocrit 36.0 - 46.0 % 36.4  Platelets 150 - 400 K/uL 258    No images are attached to the encounter.  No results found.  Assessment and plan- Patient is a 68 y.o. female with history of stage III C1 serous endometrial cancer who presents to symptom management clinic for vaginal odor.  1.  Persistent genital malodor- chronic problem, acutely worse. No evidence of infection on UA or wet prep. Discussed with Dr. Fransisca Connors, [facility] and reviewed previous work-up.  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 Per update, common causes include BV, trichomoniasis, PID, fistula, hidradenitis suppurativa, chronic constipation, urinary incontinence, fecal incontinence, poor hygiene, malignant ulcer, excessive genital perspiration local bacterial colonization secondary to obesity, and metabolic disorders.  She says that she does have excessive genital perspiration and BMI ~35 kg/m2.   We discussed options for management including returning to clinic to check NuSwab+ to further evaluate for possible infectious etiologies when patient feels symptoms are more severe vs others. Given that labs today were negative for infection I would not recommend treating with antifungals or antibiotics. Per up-to-date, for women with no identifiable abnormalities, use of a specific medical grade stainless steel douching device (waterworks douching device) can be helpful.  And RCT including 140 women with perceived vaginal odor and no vaginal infection reported significant improvement after douching daily for 4 weeks with tap water using this device.  In the water work prescription, odor intensity scores fell from 7.3-1.8 (p<0.001), which was superior to that among women who used a conventional over-the-counter plastic douche bag, 7.2  to 3.4 (p<0.003).   We discussed a trial of tap water douching using the Water Works device which patient is agreeable to. She will contact her pharmacy to assist with ordering device which in study used 32 ounces tepid tap water for approximately 2 minutes with constant manipulation to make contact with all the vaginal walls. I asked that she keep a diary of her symptoms and I'd like to see her back in 4 weeks to re-evaluate her symptoms. If symptoms improved, I could see her virtually and I would advise her to continue use of the waterworks device. If symptoms not improved or if interval infection I would want to see her in clinic for re-evaluation, collection of  NuSwab+.    2. Primary Care- patient wishes to transfer her primary care. I provided her the number for the [facility] to help establish care.   DISPOSITION:  RTC in approximately 4 weeks for re-evaluation.   Visit Diagnosis 1. Vaginal odor     Patient expressed understanding and was in agreement with this plan. She also understands that She can call clinic at any time with any questions, concerns, or complaints.   Thank you for allowing me to participate in the care of this very pleasant patient.   Beckey Rutter, DNP, AGNP-C [facility] [place] [facility] (work cell) [phone] (office)  ADDENDUM: Called patient 08/29/2019 to discuss in detail and my recommendations. She says she believes she has UTI as she has 'that smell again' and 'it's worse'. I advised her that UA did not show evidence of infection but that we could recheck urine today to re-evaluate. She said that she doesn't want to come into clinic due to 'hassle' of temperature checks, checking in process, etc. And wishes to drop off sample. I advised against this and gave rationale for collection methods. Patient says she may call her primary care. She does plan to start the WaterWorks device to see if symptoms improve.

## 2019-08-29 ENCOUNTER — Other Ambulatory Visit: Payer: Self-pay | Admitting: *Deleted

## 2019-08-29 ENCOUNTER — Telehealth: Payer: Self-pay | Admitting: *Deleted

## 2019-08-29 ENCOUNTER — Other Ambulatory Visit: Payer: Self-pay

## 2019-08-29 ENCOUNTER — Inpatient Hospital Stay: Payer: Medicare Other

## 2019-08-29 DIAGNOSIS — C541 Malignant neoplasm of endometrium: Secondary | ICD-10-CM | POA: Diagnosis not present

## 2019-08-29 DIAGNOSIS — R300 Dysuria: Secondary | ICD-10-CM

## 2019-08-29 LAB — URINALYSIS, COMPLETE (UACMP) WITH MICROSCOPIC
Bacteria, UA: NONE SEEN
Bilirubin Urine: NEGATIVE
Glucose, UA: NEGATIVE mg/dL
Hgb urine dipstick: NEGATIVE
Ketones, ur: 5 mg/dL — AB
Nitrite: NEGATIVE
Protein, ur: 30 mg/dL — AB
Specific Gravity, Urine: 1.023 (ref 1.005–1.030)
pH: 5 (ref 5.0–8.0)

## 2019-08-29 NOTE — Telephone Encounter (Signed)
Patient called asking for results and also to inform that she has a UTI with foul smelling urine and frequency, she stated she is going to take a urine sample in somewhere today  Wet prep, genital Order: [id number]  Status:  Final result Visible to patient:  No (not released) Next appt:  10/20/2019 at 09:00 AM in Radiology ([facility]-CT) Dx:  Vaginal discharge     Ref Range & Units 6d ago 46mo ago 92yr ago  Yeast Wet Prep HPF POC NONE SEEN NONE SEEN  NONE SEEN  PRESENTAbnormal    Comment: Specimen diluted due to transport tube containing more than 1 ml of saline, interpret results with caution.  Trich, Wet Prep NONE SEEN NONE SEEN  NONE SEEN  NONE SEEN   Comment: Specimen diluted due to transport tube containing more than 1 ml of saline, interpret results with caution.  Clue Cells Wet Prep HPF POC NONE SEEN NONE SEEN  NONE SEEN  PRESENTAbnormal    Comment: Specimen diluted due to transport tube containing more than 1 ml of saline, interpret results with caution.  WBC, Wet Prep HPF POC NONE SEEN FEWAbnormal   MODERATEAbnormal   MODERATEAbnormal    Comment: Specimen diluted due to transport tube containing more than 1 ml of saline, interpret results with caution.  Sperm  NONE SEEN  NONE SEEN CM  NONE SEEN   Comment: Specimen diluted due to transport tube containing more than 1 ml of saline, interpret results with caution.  Performed at [facility], [address] [place], [place] [address]   Resulting Agency  [facility] CLIN LAB [facility] CLIN LAB [facility] CLIN LAB      Specimen Collected: 08/23/19 15:40 Last Resulted: 08/23/19 16:03       Urinalysis, Complete w Microscopic Order: [id number] Status:  Final result Visible to patient:  No (not released) Next appt:  10/20/2019 at 09:00 AM in Radiology ([facility]-CT) Dx:  Malodorous urine  Ref Range & Units 6d ago 23mo ago 15yr ago  Color, Urine YELLOW YELLOWAbnormal      APPearance CLEAR CLOUDYAbnormal    R  yellow R   Specific Gravity, Urine 1.005 - 1.030  1.021     pH 5.0 - 8.0 5.0     Glucose, UA NEGATIVE mg/dL NEGATIVE     Hgb urine dipstick NEGATIVE NEGATIVE     Bilirubin Urine NEGATIVE NEGATIVE     Ketones, ur NEGATIVE mg/dL NEGATIVE     Protein, ur NEGATIVE mg/dL 30Abnormal      Nitrite NEGATIVE NEGATIVE     Leukocytes,Ua NEGATIVE SMALLAbnormal      RBC / HPF 0 - 5 RBC/hpf 6-10     WBC, UA 0 - 5 WBC/hpf 11-20     Bacteria, UA NONE SEEN RAREAbnormal      Squamous Epithelial / LPF 0 - 5 0-5     Mucus  PRESENT     Hyaline Casts, UA  PRESENT     Comment: Performed at [facility], [address] [place], [place] [address]  Resulting Agency  [facility] CLIN LAB        Specimen Collected: 08/23/19 16:10 Last Resulted: 08/23/19 16:53

## 2019-08-30 ENCOUNTER — Telehealth: Payer: Self-pay | Admitting: *Deleted

## 2019-08-30 LAB — URINE CULTURE

## 2019-08-30 NOTE — Telephone Encounter (Signed)
Called patient; discussed results. Awaiting culture. Discussed starting tapwater douche daily and managing moisture in the interim to see if that helps with odor which she is agreeable to. She has not been able to locate the Water Works device and per her pharmacy it is out of stock for ordering. In the interim, she will use alternative device still with tap water only. Advised that is should not place douche device high in to vagina given recent surgery.

## 2019-08-30 NOTE — Telephone Encounter (Signed)
Patient called asking if she has a UTI or not. Please return her call.  Contains abnormal data Urinalysis, Complete w Microscopic Order: [id number] Status:  Final result Visible to patient:  No (not released) Next appt:  10/20/2019 at 09:00 AM in Radiology ([facility]-CT) Dx:  Dysuria  Ref Range & Units 1d ago 7d ago 49mo ago  Color, Urine YELLOW AMBERAbnormal   YELLOWAbnormal     Comment: BIOCHEMICALS MAY BE AFFECTED BY COLOR  APPearance CLEAR CLOUDYAbnormal   CLOUDYAbnormal    R   Specific Gravity, Urine 1.005 - 1.030 1.023  1.021    pH 5.0 - 8.0 5.0  5.0    Glucose, UA NEGATIVE mg/dL NEGATIVE  NEGATIVE    Hgb urine dipstick NEGATIVE NEGATIVE  NEGATIVE    Bilirubin Urine NEGATIVE NEGATIVE  NEGATIVE    Ketones, ur NEGATIVE mg/dL 5Abnormal   NEGATIVE    Protein, ur NEGATIVE mg/dL 30Abnormal   30Abnormal     Nitrite NEGATIVE NEGATIVE  NEGATIVE    Leukocytes,Ua NEGATIVE TRACEAbnormal   SMALLAbnormal     RBC / HPF 0 - 5 RBC/hpf 0-5  6-10    WBC, UA 0 - 5 WBC/hpf 6-10  11-20    Bacteria, UA NONE SEEN NONE SEEN  RAREAbnormal     Squamous Epithelial / LPF 0 - 5 6-10  0-5    Mucus  PRESENT  PRESENT    Hyaline Casts, UA  PRESENT  PRESENT CM    Ca Oxalate Crys, UA  PRESENT     Comment: Performed at [facility], [address] [place], [place] [address]  Resulting Agency  [facility] CLIN LAB [facility] CLIN LAB       Specimen Collected: 08/29/19 12:09 Last Resulted: 08/29/19 14:10

## 2019-08-31 ENCOUNTER — Telehealth: Payer: Self-pay | Admitting: Nurse Practitioner

## 2019-08-31 NOTE — Telephone Encounter (Signed)
Called patient & discussed results. She feels symptoms have improved and does not wish to recollect at this time. She will call clinic if symptoms return or worsen.

## 2019-09-22 ENCOUNTER — Telehealth: Payer: Self-pay | Admitting: *Deleted

## 2019-09-22 NOTE — Telephone Encounter (Signed)
Patient called asking for return call to discuss the douche apparatus and about her lab results. Please return her call [phone]

## 2019-09-22 NOTE — Telephone Encounter (Signed)
Returned patient's call. She has been unable to obtain the WaterWorks douching device but has been doing tap water douche to help with vaginal odor. Symptoms improved though not resolved. She also complains of pelvic pain that has been intermittent and present since surgery. It is not more intense, more frequent, or bothersome. I encouraged her to call for Symptom Management Clinic appointment or visit with Dr. [name] if she would like to be evaluated for this.

## 2019-10-20 ENCOUNTER — Ambulatory Visit
Admission: RE | Admit: 2019-10-20 | Discharge: 2019-10-20 | Disposition: A | Discharge status: Home / Self Care | Payer: Medicare Other | Source: Ambulatory Visit | Attending: Nurse Practitioner | Admitting: Nurse Practitioner

## 2019-10-20 ENCOUNTER — Other Ambulatory Visit: Payer: Self-pay

## 2019-10-20 DIAGNOSIS — C541 Malignant neoplasm of endometrium: Secondary | ICD-10-CM

## 2019-10-20 HISTORY — DX: Malignant (primary) neoplasm, unspecified: C80.1

## 2019-10-20 LAB — POCT I-STAT CREATININE: Creatinine, Ser: 0.7 mg/dL (ref 0.44–1.00)

## 2019-10-20 MED ORDER — IOHEXOL 300 MG/ML  SOLN
100.0000 mL | Freq: Once | INTRAMUSCULAR | Status: AC | PRN
  Administered 2019-10-20: 100 mL via INTRAVENOUS

## 2019-10-25 ENCOUNTER — Other Ambulatory Visit: Payer: Self-pay

## 2019-10-26 ENCOUNTER — Inpatient Hospital Stay (HOSPITAL_BASED_OUTPATIENT_CLINIC_OR_DEPARTMENT_OTHER): Payer: Medicare Other | Admitting: Oncology

## 2019-10-26 ENCOUNTER — Inpatient Hospital Stay: Payer: Medicare Other | Attending: Obstetrics and Gynecology | Admitting: Obstetrics and Gynecology

## 2019-10-26 ENCOUNTER — Other Ambulatory Visit: Payer: Self-pay

## 2019-10-26 VITALS — BP 144/89 | HR 88 | Temp 97.4°F | Resp 20 | Wt 175.6 lb

## 2019-10-26 DIAGNOSIS — Z90710 Acquired absence of both cervix and uterus: Secondary | ICD-10-CM | POA: Insufficient documentation

## 2019-10-26 DIAGNOSIS — E039 Hypothyroidism, unspecified: Secondary | ICD-10-CM | POA: Insufficient documentation

## 2019-10-26 DIAGNOSIS — I429 Cardiomyopathy, unspecified: Secondary | ICD-10-CM | POA: Insufficient documentation

## 2019-10-26 DIAGNOSIS — C541 Malignant neoplasm of endometrium: Secondary | ICD-10-CM | POA: Insufficient documentation

## 2019-10-26 DIAGNOSIS — I10 Essential (primary) hypertension: Secondary | ICD-10-CM | POA: Diagnosis not present

## 2019-10-26 DIAGNOSIS — Z7982 Long term (current) use of aspirin: Secondary | ICD-10-CM | POA: Diagnosis not present

## 2019-10-26 DIAGNOSIS — Z7952 Long term (current) use of systemic steroids: Secondary | ICD-10-CM | POA: Insufficient documentation

## 2019-10-26 DIAGNOSIS — E7800 Pure hypercholesterolemia, unspecified: Secondary | ICD-10-CM | POA: Insufficient documentation

## 2019-10-26 DIAGNOSIS — Z79899 Other long term (current) drug therapy: Secondary | ICD-10-CM | POA: Insufficient documentation

## 2019-10-26 DIAGNOSIS — Z90722 Acquired absence of ovaries, bilateral: Secondary | ICD-10-CM | POA: Insufficient documentation

## 2019-10-26 DIAGNOSIS — J019 Acute sinusitis, unspecified: Secondary | ICD-10-CM | POA: Diagnosis not present

## 2019-10-26 DIAGNOSIS — R911 Solitary pulmonary nodule: Secondary | ICD-10-CM | POA: Insufficient documentation

## 2019-10-26 DIAGNOSIS — M797 Fibromyalgia: Secondary | ICD-10-CM | POA: Diagnosis not present

## 2019-10-26 DIAGNOSIS — E785 Hyperlipidemia, unspecified: Secondary | ICD-10-CM | POA: Diagnosis not present

## 2019-10-26 DIAGNOSIS — I471 Supraventricular tachycardia: Secondary | ICD-10-CM | POA: Insufficient documentation

## 2019-10-26 DIAGNOSIS — M069 Rheumatoid arthritis, unspecified: Secondary | ICD-10-CM | POA: Insufficient documentation

## 2019-10-26 DIAGNOSIS — E119 Type 2 diabetes mellitus without complications: Secondary | ICD-10-CM | POA: Insufficient documentation

## 2019-10-26 DIAGNOSIS — Z7189 Other specified counseling: Secondary | ICD-10-CM

## 2019-10-26 MED ORDER — AMOXICILLIN-POT CLAVULANATE 875-125 MG PO TABS: 1.0000 | ORAL_TABLET | Freq: Two times a day (BID) | ORAL | 0 refills | Status: DC

## 2019-10-26 NOTE — Progress Notes (Signed)
Gynecologic Oncology Interval Visit   Referring Provider: Dr. Amalia Hailey  Chief Complaint: Stage IIIC-1 serous endometrial cancer  Subjective:  Sandra Fischer is a 68 y.o. G70P0 female (s/p laparotomy myomectomy for leiomyoma), initially seen in consultation from Dr. Amalia Hailey, diagnosed with stage IIIC-1 serous endometrial cancer returns to clinic for follow-up and discussion of imaging results.  Based on pathology, adjuvant chemotherapy & radiation was recommended. She saw Dr. [name] on 07/14/2019 and lengthy discussion regarding rationale of adjuvant treatment. She declined adjuvant treatment.  In the interim, she has had CT imaging. 10/20/2019- CT Chest Abdomen Pelvis W Contrast 1.  Status post interval hysterectomy. 2. There are multiple newly enlarged retroperitoneal and right iliac lymph nodes, the largest left retroperitoneal node measuring 1.9 x 1.6 cm (series 2, image 71), concerning for nodal metastatic disease. 3. Unchanged 4 mm ground-glass pulmonary nodule of the left pulmonary apex (series 3, image 24). This remains nonspecific although is again an unlikely manifestation of pulmonary metastatic disease. 4. Other chronic, incidental, and postoperative findings as above. Aortic Atherosclerosis (ICD10-I70.0).  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 She has been doing daily tap water douches and managing sweating with antiperspirant powder which she says helps.     Gynecologic Oncology History:  She initially presented to Dr. Amalia Hailey as [phone] female with complaint of postmenopausal bleeding, intermittent, heavy at times.    Transabdominal ultrasound on 05/05/2019 demonstrated endometrium measuring 79m. Uterus anteverted measuring 11.3 x 6.6 x 7.9 cm, heterogeous echo texture w/o evidence of focal masses. Within uterus multiple suspected fibroids measuring 7.2 x 4.8 x 6.6 cm and 2.6 x 1.7 x 3.1 cm. Ovaries not visualized. No adnexal masses.  No free fluid in cul de sac.   Endometrial biopsy was performed on 05/20/2019 and demonstrated high-grade mixed endometrioid, predominantly, and serous carcinoma.  She complains of persistent bleeding and fatigue which she attributes to the bleeding. She presents today for management. She has significant medical issues including rheumatoid arthritis requiring chronic steroids (5-10 mg daily) for a long time (she does not know how long) and leflunomide (ARAVA). She also has undergone myomectomy for leiomyoma and abdominoplasty.  She also has a history of cardiac disease. She Cardioversion for SVT on 10/17/2018. Her cardiologist Dr. [name] [facility] chest pain and shortness of breath, noted to be in SVT, converted to sinus rhythm with adenosine,in the setting of colitis, anemia with hemoglobin 8.5, and untreated hypothyroidism with TSH 25, which has since returned to normal range.2D echocardiogram revealed moderately reduced LV function with LVEF 35 to 40% with diffuse hypokinesis. The patient underwent Lexiscan Myoview on 01/19/2019, which revealed revealed LVEF 49%, a mild fixed inferior wall defect, scar versus artifact, with no evidence of ischemia. She was last seen on 04/26/2019 by Cardiology ([name] with a plan to stay on her current medications and she was counseled about low sodium diet, DASH, and continuing hyperlipidemia medications.   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 She is married and provides care for her mother.   06/14/2019 CT C/A/P IMPRESSION: 1. Bulky fibroid uterus. There is no direct noncontrast CT evidence of endometrial malignancy. No evidence of lymphadenopathy or metastatic disease in the chest, abdomen, or pelvis. Please note that noncontrast CT is limited for the evaluation of solid organ metastases.  2. Stable 4 mm ground-glass pulmonary nodule of the left pulmonary apex (series 3, image 24). Although  nonspecific, this is  an unlikely isolated manifestation of metastatic disease. Attention on follow-up.  3. Chronic, incidental, and postoperative findings as detailed above.  She underwent TLH_BSO with Dr. Allen Norris on 06/21/2019.   Tumor size 9.3 cm, invading 99% of myometrium (3.55 of 3.6 cm), positive right external iliac sentinel lymph node. Negative washings. MSS/MMR - Intact/normal. HER2 - negative at 28%/1+.   Case was discussed at [place] on 07/04/2019. Possible PORTEC3 regimen given subgroup analysis showing benefit in serous history vs clinical trial was discussed.   Problem List: Patient Active Problem List   Diagnosis Date Noted  . Goals of care, counseling/discussion 07/20/2019  . Endometrial cancer ([place]) 07/15/2019  . Fibromyalgia   . Cardiomyopathy ([place]) 12/06/2018  . Perennial and seasonal allergic rhinitis 02/09/2018  . Moderate persistent asthma 02/09/2018  . Allergic conjunctivitis 02/09/2018  . History of food allergy 02/09/2018  . Allergic reaction 02/09/2018  . Asthma exacerbation 08/22/2016  . RA (rheumatoid arthritis) ([place]) 08/22/2016  . HTN (hypertension) 08/22/2016    Past Medical History: Past Medical History:  Diagnosis Date  . Asthma   . Asthma   . Cancer ([place])   . Collagen vascular disease ([place])   . Diabetes mellitus without complication ([place])    History of Diabetes   . Environmental allergies   . Fibromyalgia   . High cholesterol   . Hypertension   . RA (rheumatoid arthritis) ([place])   . Thyroid disease     Past Surgical History: Past Surgical History:  Procedure Laterality Date  . CATARACT EXTRACTION    . CHOLECYSTECTOMY    . fibroids removed    . THYROIDECTOMY, PARTIAL    . tummy tuck      Past Gynecologic History:  As per HPI  OB History:  OB History  Gravida Para Term Preterm AB Living  2       2    SAB TAB Ectopic Multiple Live Births  2            # Outcome Date GA Lbr Len/2nd Weight Sex Delivery Anes PTL Lv   2 SAB           1 SAB             Family History: Family History  Problem Relation Age of Onset  . Diabetes Mother   . Hypertension Mother   . Hyperlipidemia Mother   . Dementia Mother   . Breast cancer Neg Hx   . Ovarian cancer Neg Hx   . Colon cancer Neg Hx     Social History: Social History   Socioeconomic History  . Marital status: Married    Spouse name: Not on file  . Number of children: Not on file  . Years of education: Not on file  . Highest education level: Not on file  Occupational History  . Not on file  Social Needs  . Financial resource strain: Not on file  . Food insecurity    Worry: Not on file    Inability: Not on file  . Transportation needs    Medical: Not on file    Non-medical: Not on file  Tobacco Use  . Smoking status: Never Smoker  . Smokeless tobacco: Never Used  Substance and Sexual Activity  . Alcohol use: No  . Drug use: No  . Sexual activity: Yes    Birth control/protection: Post-menopausal  Lifestyle  . Physical activity    Days per week: Not on file    Minutes per session: Not on  file  . Stress: Not on file  Relationships  . Social Herbalist on phone: Not on file    Gets together: Not on file    Attends religious service: Not on file    Active member of club or organization: Not on file    Attends meetings of clubs or organizations: Not on file    Relationship status: Not on file  . Intimate partner violence    Fear of current or ex partner: Not on file    Emotionally abused: Not on file    Physically abused: Not on file    Forced sexual activity: Not on file  Other Topics Concern  . Not on file  Social History Narrative  . Not on file    Allergies: Allergies  Allergen Reactions  . Abatacept Hives  . Codeine Hives, Nausea And Vomiting and Nausea Only  . Latex Itching and Hives  . Shellfish Allergy Swelling  . Penicillins Nausea And Vomiting    Current Medications: Current Outpatient Medications   Medication Sig Dispense Refill  . albuterol (ACCUNEB) 1.25 MG/3ML nebulizer solution Take 1 ampule by nebulization every 6 (six) hours as needed for wheezing.    Marland Kitchen albuterol (PROVENTIL HFA;VENTOLIN HFA) 108 (90 Base) MCG/ACT inhaler Inhale 2 puffs into the lungs every 6 (six) hours as needed for wheezing or shortness of breath. 1 Inhaler 0  . aspirin EC 81 MG tablet Take 81 mg by mouth daily.    Marland Kitchen azelastine (ASTELIN) 0.1 % nasal spray Place 2 sprays into both nostrils 2 (two) times daily. 30 mL 5  . budesonide-formoterol (SYMBICORT) 160-4.5 MCG/ACT inhaler Inhale 2 puffs into the lungs 2 (two) times daily.    . calcium carbonate (OS-CAL - DOSED IN MG OF ELEMENTAL CALCIUM) 1250 (500 Ca) MG tablet Take by mouth.    Marland Kitchen CALCIUM PO Take 1 tablet by mouth daily.    . cetirizine (ZYRTEC) 10 MG tablet Take 10 mg by mouth daily.    . Cholecalciferol (D3 ADULT PO) Take 1 capsule by mouth daily.    Marland Kitchen docusate sodium (COLACE) 50 MG capsule Take 50 mg by mouth 2 (two) times daily.    . Ferrous Sulfate (IRON PO) Take by mouth.    . fluconazole (DIFLUCAN) 150 MG tablet Take 1 tablet (150 mg) by mouth. Three days later, take second tablet. 2 tablet 0  . fluticasone (FLONASE) 50 MCG/ACT nasal spray Place into both nostrils daily.    Marland Kitchen guaiFENesin-dextromethorphan (ROBITUSSIN DM) 100-10 MG/5ML syrup Take 5 mLs by mouth every 4 (four) hours as needed for cough. 118 mL 0  . ipratropium (ATROVENT) 0.02 % nebulizer solution Take 0.5 mg by nebulization 4 (four) times daily.    Marland Kitchen leflunomide (ARAVA) 20 MG tablet Take 20 mg by mouth daily.    Marland Kitchen levothyroxine (SYNTHROID, LEVOTHROID) 88 MCG tablet Take 88 mcg by mouth daily before breakfast.    . lisinopril (PRINIVIL,ZESTRIL) 40 MG tablet Take 40 mg by mouth daily.    . metoprolol succinate (TOPROL-XL) 50 MG 24 hr tablet Take 50 mg by mouth daily. Take with or immediately following a meal.    . montelukast (SINGULAIR) 10 MG tablet Take 10 mg by mouth at bedtime.    .  norethindrone (AYGESTIN) 5 MG tablet Take 1 tablet (5 mg total) by mouth 2 (two) times daily for 10 days. As directed 20 tablet 0  . olopatadine (PATANOL) 0.1 % ophthalmic solution Place 1 drop into both eyes 2 (  two) times daily. 5 mL 5  . omeprazole (PRILOSEC) 10 MG capsule Take 20 mg by mouth daily.    . pravastatin (PRAVACHOL) 40 MG tablet Take 40 mg by mouth daily.    . Prenatal Vit-Fe Fumarate-FA (MULTIVITAMIN-PRENATAL) 27-0.8 MG TABS tablet Take 1 tablet by mouth daily at 12 noon.    . senna-docusate (SENOKOT-S) 8.6-50 MG tablet Take by mouth.    . tiotropium (SPIRIVA) 18 MCG inhalation capsule Place 18 mcg into inhaler and inhale daily.    . traMADol (ULTRAM) 50 MG tablet Take 100 mg by mouth as needed (for rheumatoid arthritis).    . verapamil (CALAN) 120 MG tablet Take 240 mg by mouth 2 (two) times daily.    Marland Kitchen desloratadine (CLARINEX) 5 MG tablet Take 5 mg by mouth daily.    Marland Kitchen EPINEPHrine 0.3 mg/0.3 mL IJ SOAJ injection Use as directed for severe allergic reaction. (Patient not taking: Reported on 10/25/2019) 2 Device 1   No current facility-administered medications for this visit.    Review of Systems General:  Decreased appetite/fatigue Skin: rash under breast Eyes: no complaints HEENT: no complaints Breasts: no complaints Pulmonary: no complaints Cardiac: no complaints Gastrointestinal: no complaints Genitourinary/Sexual: vaginal malodor (chronic) Ob/Gyn: perspiration Musculoskeletal: back pain Hematology: no complaints Neurologic/Psych: no complaints Skin: sking rash under left breast now resolved Allergies: yes  Objective:  Physical Examination:  BP (!) 144/89 (BP Location: Right Arm, Patient Position: Sitting)   Pulse 88   Temp (!) 97.4 F (36.3 C) (Temporal)   Resp 20   Wt 175 lb 9.6 oz (79.7 kg)   SpO2 99%   BMI 32.12 kg/m     ECOG Performance Status: 1 - Symptomatic but completely ambulatory  GENERAL: Patient is a well appearing female in no acute  distress HEENT:  Sclera clear. Anicteric NODES:  Negative axillary, supraclavicular, inguinal lymph node survery LUNGS:  Clear to auscultation bilaterally.   HEART:  Regular rate and rhythm.  ABDOMEN:  Soft, nontender.  No hernias, incisions well healed. No masses or ascites EXTREMITIES:  No peripheral edema. Atraumatic. No cyanosis SKIN:  Clear with no obvious rashes or skin changes.  NEURO:  Nonfocal. Well oriented.  Appropriate affect.  Pelvic: exam chaperoned by NP;  Vulva: normal appearing vulva with no masses, tenderness or lesions; Vagina: normal vagina; Adnexa: surgically absent; Uterus: surgically absent, vaginal cuff well healed; Cervix: absent; Rectal: not indicated  Labs: No Labs on site today  Radiologic Imaging:  I personally reviewed with the below imaging and agree with the findings as reported.  10/20/2019- CT Chest Abdomen Pelvis W Contrast 1.  Status post interval hysterectomy. 2. There are multiple newly enlarged retroperitoneal and right iliac lymph nodes, the largest left retroperitoneal node measuring 1.9 x 1.6 cm (series 2, image 71), concerning for nodal metastatic disease. 3. Unchanged 4 mm ground-glass pulmonary nodule of the left pulmonary apex (series 3, image 24). This remains nonspecific although is again an unlikely manifestation of pulmonary metastatic disease. 4. Other chronic, incidental, and postoperative findings as above. Aortic Atherosclerosis (ICD10-I70.0).     Assessment:  Chela Sutphen is a 68 y.o. 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  Tumor size 9.3 cm, invading 99% of myometrium (3.55 of 3.6cm), positive right external iliac sentinel lymph nodes. Negative washings. Recurrent disease 10/20/2019 with adenopathy up to 1.9 x 1.6 cm.   Pulmonary nodule,  uncertain etiology.   Medical  co-morbidities complicating care: She has significant medical issues including rheumatoid arthritis requiring chronic steroids (5-10 mg daily) for a long time (she does not know how long) and leflunomide (ARAVA); HTN; Dilated cardiomyopathy; hyperlipidemia; multiple intra-abdominal surgeries (myomectomy for leiomyoma, cholecystectomy, and abdominoplasty) ; obesity Body mass index is 32.12 kg/m.  Plan:   Problem List Items Addressed This Visit      Genitourinary   Endometrial cancer ([place]) - Primary        We again recommended chemotherapy with or without radiation. Dr. [name] was able to come see her in clinic today to review chemotherapy options and the patient will follow up with her tomorrow with her decisions. I discussed that given the size in a short period of time and aggressive nature of the cancer I would recommend chemotherapy. There may also be a role for radiation therapy.   I personally had a face to face interaction and evaluated the patient jointly with the NP, Ms. Beckey Rutter.  I have reviewed her history and available records and have performed the key portions of the physical exam including general, focal neuro exam, abdominal exam, pelvic exam with my findings confirming those documented above by the APP.  I have discussed the case with the APP and the patient.  I agree with the above documentation, assessment and plan which was fully formulated by me.  Counseling was completed by me.   I personally saw the patient and performed a substantive portion of this encounter in conjunction with the listed APP as documented above.  A total of 25 minutes were spent with the patient/family today; >50% was spent in education,goals of care counseling and coordination of care for endometrial cancer.   Ang[place] Gaetana Michaelis, MD   CC:  Dr. Amalia Hailey

## 2019-10-27 MED ORDER — PROCHLORPERAZINE MALEATE 10 MG PO TABS: 10.0000 mg | ORAL_TABLET | Freq: Four times a day (QID) | ORAL | 1 refills | Status: DC | PRN

## 2019-10-27 MED ORDER — DEXAMETHASONE 4 MG PO TABS: ORAL_TABLET | ORAL | 1 refills | Status: DC

## 2019-10-27 MED ORDER — LIDOCAINE-PRILOCAINE 2.5-2.5 % EX CREA: TOPICAL_CREAM | CUTANEOUS | 3 refills | Status: DC

## 2019-10-27 NOTE — Progress Notes (Signed)
START ON PATHWAY REGIMEN - Uterine     A cycle is every 21 days:     Paclitaxel      Carboplatin   **Always confirm dose/schedule in your pharmacy ordering system**  Patient Characteristics: Papillary Serous and Clear Cell Histology, Newly Diagnosed, Resected Histology: Papillary Serous and Clear Cell Histology Therapeutic Status: Newly Diagnosed AJCC T Category: T1b AJCC N Category: N1 AJCC M Category: M0 AJCC 8 Stage Grouping: IIIC1 Surgical Status: Resected Intent of Therapy: Curative Intent, Discussed with Patient

## 2019-10-27 NOTE — Patient Instructions (Signed)
Paclitaxel injection What is this medicine? PACLITAXEL (PAK li TAX el) is a chemotherapy drug. It targets fast dividing cells, like cancer cells, and causes these cells to die. This medicine is used to treat ovarian cancer, breast cancer, lung cancer, Kaposi's sarcoma, and other cancers. This medicine may be used for other purposes; ask your health care provider or pharmacist if you have questions. COMMON BRAND NAME(S): Onxol, Taxol What should I tell my health care provider before I take this medicine? They need to know if you have any of these conditions:  history of irregular heartbeat  liver disease  low blood counts, like low white cell, platelet, or red cell counts  lung or breathing disease, like asthma  tingling of the fingers or toes, or other nerve disorder  an unusual or allergic reaction to paclitaxel, alcohol, polyoxyethylated castor oil, other chemotherapy, other medicines, foods, dyes, or preservatives  pregnant or trying to get pregnant  breast-feeding How should I use this medicine? This drug is given as an infusion into a vein. It is administered in a hospital or clinic by a specially trained health care professional. Talk to your pediatrician regarding the use of this medicine in children. Special care may be needed. Overdosage: If you think you have taken too much of this medicine contact a poison control center or emergency room at once. NOTE: This medicine is only for you. Do not share this medicine with others. What if I miss a dose? It is important not to miss your dose. Call your doctor or health care professional if you are unable to keep an appointment. What may interact with this medicine? Do not take this medicine with any of the following medications:  disulfiram  metronidazole This medicine may also interact with the following medications:  antiviral medicines for hepatitis, HIV or AIDS  certain antibiotics like erythromycin and  clarithromycin  certain medicines for fungal infections like ketoconazole and itraconazole  certain medicines for seizures like carbamazepine, phenobarbital, phenytoin  gemfibrozil  nefazodone  rifampin  St. John's wort This list may not describe all possible interactions. Give your health care provider a list of all the medicines, herbs, non-prescription drugs, or dietary supplements you use. Also tell them if you smoke, drink alcohol, or use illegal drugs. Some items may interact with your medicine. What should I watch for while using this medicine? Your condition will be monitored carefully while you are receiving this medicine. You will need important blood work done while you are taking this medicine. This medicine can cause serious allergic reactions. To reduce your risk you will need to take other medicine(s) before treatment with this medicine. If you experience allergic reactions like skin rash, itching or hives, swelling of the face, lips, or tongue, tell your doctor or health care professional right away. In some cases, you may be given additional medicines to help with side effects. Follow all directions for their use. This drug may make you feel generally unwell. This is not uncommon, as chemotherapy can affect healthy cells as well as cancer cells. Report any side effects. Continue your course of treatment even though you feel ill unless your doctor tells you to stop. Call your doctor or health care professional for advice if you get a fever, chills or sore throat, or other symptoms of a cold or flu. Do not treat yourself. This drug decreases your body's ability to fight infections. Try to avoid being around people who are sick. This medicine may increase your risk to bruise   or bleed. Call your doctor or health care professional if you notice any unusual bleeding. Be careful brushing and flossing your teeth or using a toothpick because you may get an infection or bleed more easily.  If you have any dental work done, tell your dentist you are receiving this medicine. Avoid taking products that contain aspirin, acetaminophen, ibuprofen, naproxen, or ketoprofen unless instructed by your doctor. These medicines may hide a fever. Do not become pregnant while taking this medicine. Women should inform their doctor if they wish to become pregnant or think they might be pregnant. There is a potential for serious side effects to an unborn child. Talk to your health care professional or pharmacist for more information. Do not breast-feed an infant while taking this medicine. Men are advised not to father a child while receiving this medicine. This product may contain alcohol. Ask your pharmacist or healthcare provider if this medicine contains alcohol. Be sure to tell all healthcare providers you are taking this medicine. Certain medicines, like metronidazole and disulfiram, can cause an unpleasant reaction when taken with alcohol. The reaction includes flushing, headache, nausea, vomiting, sweating, and increased thirst. The reaction can last from 30 minutes to several hours. What side effects may I notice from receiving this medicine? Side effects that you should report to your doctor or health care professional as soon as possible:  allergic reactions like skin rash, itching or hives, swelling of the face, lips, or tongue  breathing problems  changes in vision  fast, irregular heartbeat  high or low blood pressure  mouth sores  pain, tingling, numbness in the hands or feet  signs of decreased platelets or bleeding - bruising, pinpoint red spots on the skin, black, tarry stools, blood in the urine  signs of decreased red blood cells - unusually weak or tired, feeling faint or lightheaded, falls  signs of infection - fever or chills, cough, sore throat, pain or difficulty passing urine  signs and symptoms of liver injury like dark yellow or brown urine; general ill feeling or  flu-like symptoms; light-colored stools; loss of appetite; nausea; right upper belly pain; unusually weak or tired; yellowing of the eyes or skin  swelling of the ankles, feet, hands  unusually slow heartbeat Side effects that usually do not require medical attention (report to your doctor or health care professional if they continue or are bothersome):  diarrhea  hair loss  loss of appetite  muscle or joint pain  nausea, vomiting  pain, redness, or irritation at site where injected  tiredness This list may not describe all possible side effects. Call your doctor for medical advice about side effects. You may report side effects to FDA at 1-800-FDA-1088. Where should I keep my medicine? This drug is given in a hospital or clinic and will not be stored at home. NOTE: This sheet is a summary. It may not cover all possible information. If you have questions about this medicine, talk to your doctor, pharmacist, or health care provider.  2020 [employer] Standard (2017-08-18 13:14:55) Carboplatin injection What is this medicine? CARBOPLATIN (KAR boe pla tin) is a chemotherapy drug. It targets fast dividing cells, like cancer cells, and causes these cells to die. This medicine is used to treat ovarian cancer and many other cancers. This medicine may be used for other purposes; ask your health care provider or pharmacist if you have questions. COMMON BRAND NAME(S): Paraplatin What should I tell my health care provider before I take this medicine? They need to   know if you have any of these conditions:  blood disorders  hearing problems  kidney disease  recent or ongoing radiation therapy  an unusual or allergic reaction to carboplatin, cisplatin, other chemotherapy, other medicines, foods, dyes, or preservatives  pregnant or trying to get pregnant  breast-feeding How should I use this medicine? This drug is usually given as an infusion into a vein. It is administered in a  hospital or clinic by a specially trained health care professional. Talk to your pediatrician regarding the use of this medicine in children. Special care may be needed. Overdosage: If you think you have taken too much of this medicine contact a poison control center or emergency room at once. NOTE: This medicine is only for you. Do not share this medicine with others. What if I miss a dose? It is important not to miss a dose. Call your doctor or health care professional if you are unable to keep an appointment. What may interact with this medicine?  medicines for seizures  medicines to increase blood counts like filgrastim, pegfilgrastim, sargramostim  some antibiotics like amikacin, gentamicin, neomycin, streptomycin, tobramycin  vaccines Talk to your doctor or health care professional before taking any of these medicines:  acetaminophen  aspirin  ibuprofen  ketoprofen  naproxen This list may not describe all possible interactions. Give your health care provider a list of all the medicines, herbs, non-prescription drugs, or dietary supplements you use. Also tell them if you smoke, drink alcohol, or use illegal drugs. Some items may interact with your medicine. What should I watch for while using this medicine? Your condition will be monitored carefully while you are receiving this medicine. You will need important blood work done while you are taking this medicine. This drug may make you feel generally unwell. This is not uncommon, as chemotherapy can affect healthy cells as well as cancer cells. Report any side effects. Continue your course of treatment even though you feel ill unless your doctor tells you to stop. In some cases, you may be given additional medicines to help with side effects. Follow all directions for their use. Call your doctor or health care professional for advice if you get a fever, chills or sore throat, or other symptoms of a cold or flu. Do not treat  yourself. This drug decreases your body's ability to fight infections. Try to avoid being around people who are sick. This medicine may increase your risk to bruise or bleed. Call your doctor or health care professional if you notice any unusual bleeding. Be careful brushing and flossing your teeth or using a toothpick because you may get an infection or bleed more easily. If you have any dental work done, tell your dentist you are receiving this medicine. Avoid taking products that contain aspirin, acetaminophen, ibuprofen, naproxen, or ketoprofen unless instructed by your doctor. These medicines may hide a fever. Do not become pregnant while taking this medicine. Women should inform their doctor if they wish to become pregnant or think they might be pregnant. There is a potential for serious side effects to an unborn child. Talk to your health care professional or pharmacist for more information. Do not breast-feed an infant while taking this medicine. What side effects may I notice from receiving this medicine? Side effects that you should report to your doctor or health care professional as soon as possible:  allergic reactions like skin rash, itching or hives, swelling of the face, lips, or tongue  signs of infection - fever or   chills, cough, sore throat, pain or difficulty passing urine  signs of decreased platelets or bleeding - bruising, pinpoint red spots on the skin, black, tarry stools, nosebleeds  signs of decreased red blood cells - unusually weak or tired, fainting spells, lightheadedness  breathing problems  changes in hearing  changes in vision  chest pain  high blood pressure  low blood counts - This drug may decrease the number of white blood cells, red blood cells and platelets. You may be at increased risk for infections and bleeding.  nausea and vomiting  pain, swelling, redness or irritation at the injection site  pain, tingling, numbness in the hands or  feet  problems with balance, talking, walking  trouble passing urine or change in the amount of urine Side effects that usually do not require medical attention (report to your doctor or health care professional if they continue or are bothersome):  hair loss  loss of appetite  metallic taste in the mouth or changes in taste This list may not describe all possible side effects. Call your doctor for medical advice about side effects. You may report side effects to FDA at 1-800-FDA-1088. Where should I keep my medicine? This drug is given in a hospital or clinic and will not be stored at home. NOTE: This sheet is a summary. It may not cover all possible information. If you have questions about this medicine, talk to your doctor, pharmacist, or health care provider.  2020 [employer]/Gold Standard (2008-03-21 14:38:05)  

## 2019-10-28 ENCOUNTER — Telehealth: Payer: Self-pay

## 2019-10-28 NOTE — Telephone Encounter (Signed)
Patient called and said that Dr. [name] prescribed amoxicillin for sinus infection and since she took it she has been having rheumatoid pain. She called and asked if the pain could be related to the antibiotic. Per Dr. [name] there is no relation. Asked for pt to contact PCP or rhematologist. Patient notified and voiced understanding, she will contact one of her providers.

## 2019-10-29 ENCOUNTER — Encounter: Payer: Self-pay | Admitting: Oncology

## 2019-10-29 ENCOUNTER — Ambulatory Visit: Admit: 2019-10-29 | Payer: Medicare Other | Admitting: Ophthalmology

## 2019-10-29 DIAGNOSIS — M069 Rheumatoid arthritis, unspecified: Secondary | ICD-10-CM | POA: Insufficient documentation

## 2019-10-29 SURGERY — PHACOEMULSIFICATION, CATARACT, WITH IOL INSERTION
Anesthesia: Topical | Laterality: Right

## 2019-10-29 NOTE — Progress Notes (Signed)
Hematology/Oncology Consult note [facility] Telephone:([phone] [phone]   Patient Care Team: Felipa Eth, MD as PCP - General (Internal Medicine) Clent Jacks, RN as Oncology Nurse Navigator  REFERRING PROVIDER: Felipa Eth, MD  CHIEF COMPLAINTS/REASON FOR VISIT:  Evaluation of endometrial cancer HISTORY OF PRESENTING ILLNESS:   Sandra Fischer is a  68 y.o.  female with PMH listed below was seen in consultation at the request of  Felipa Eth, MD  for evaluation of endometrial cancer Patient had TH/BSO sentinel lymph node biopsy by [facility] oncology Dr. Allen Norris on 06/21/2019. Extensive medical records from [facility] review was performed by me.  Tumor size 9.3 cm, invading 99% of myometrium [3.55 out of 3.6 cm], positive right external iliac sentinel lymph nodes.  Negative washing.  No LVI, serosa is uninvolved. 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 pT1bpN18m  FIGO stage IIIC1 MSI intact HER 2 negative.   Patient was seen by Dr. BFransisca Connorson 07/06/2019 due to vaginal odor. Patient presents for establishing care and discussion for adjuvant chemotherapy radiation.  Per Dr. BBlake Divinenote, [facility].  Portex 3 regimen was recommended given subgroup analysis showing benefit in the serous histology versus clinical trial. HER2 was ordered and pending results.   Patient has a history of SVT.  10/14/2018 2D echo reviewed moderately reduced LV function with LVEF 35 to 40% with diffuse hypokinesis.  Patient had Lexiscan Myoview on 01/19/2019.  LVEF 49%.  SPECT images revealed a mild fixed inferior wall defect, scar versus artifact.  No evidence of ischemia.  She follows up with cardiology Dr.Paraschos 04/26/2019 with a plan to stay on her current medication. 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 She is married and providing care  for her mother.  INTERVAL HISTORY Sandra Fischer a 68y.o. female who has above history reviewed by me today presents for follow up visit for management of High-grade endometrial cancer. Problems and complaints are listed below: She reports doing well at baseline.  Denies any pain today. Reports that she is having some sinus issues and requests a course of antibiotics.  Denies any fever or chills.  10/20/2019 CT chest abdomen pelvis with contrast showed disease recurrence.  There are multiple newly enlarged retroperitoneal and right iliac lymph nodes the largest the left retroperitoneal node measuring 1.9 x 1.6 cm concerning for nodal metastasis disease.  Unchanged 4 mm groundglass pulmonary nodule of the left pulmonary apex.  This remains nonspecific.   Review of Systems  Constitutional: Negative for appetite change, chills, fatigue and fever.  HENT:   Negative for hearing loss and voice change.        Sinus pain.   sinus infection  Eyes: Negative for eye problems.  Respiratory: Negative for chest tightness and cough.   Cardiovascular: Negative for chest pain.  Gastrointestinal: Negative for abdominal distention, abdominal pain and blood in stool.  Endocrine: Negative for hot flashes.  Genitourinary: Negative for difficulty urinating and frequency.   Musculoskeletal: Negative for arthralgias.  Skin: Negative for itching and rash.  Neurological: Negative for extremity weakness.  Hematological: Negative for adenopathy.  Psychiatric/Behavioral: Negative for confusion.    MEDICAL HISTORY:  Past Medical History:  Diagnosis Date  . Asthma   . Asthma   . Cancer ([place]   . Collagen vascular disease ([place]   . Diabetes mellitus without complication ([place]    History of Diabetes   .  Environmental allergies   . Fibromyalgia   . High cholesterol   . Hypertension   . RA (rheumatoid arthritis) ([place])   . Thyroid disease     SURGICAL HISTORY: Past Surgical History:  Procedure Laterality  Date  . CATARACT EXTRACTION    . CHOLECYSTECTOMY    . fibroids removed    . THYROIDECTOMY, PARTIAL    . tummy tuck      SOCIAL HISTORY: Social History   Socioeconomic History  . Marital status: Married    Spouse name: Not on file  . Number of children: Not on file  . Years of education: Not on file  . Highest education level: Not on file  Occupational History  . Not on file  Social Needs  . Financial resource strain: Not on file  . Food insecurity    Worry: Not on file    Inability: Not on file  . Transportation needs    Medical: Not on file    Non-medical: Not on file  Tobacco Use  . Smoking status: Never Smoker  . Smokeless tobacco: Never Used  Substance and Sexual Activity  . Alcohol use: No  . Drug use: No  . Sexual activity: Yes    Birth control/protection: Post-menopausal  Lifestyle  . Physical activity    Days per week: Not on file    Minutes per session: Not on file  . Stress: Not on file  Relationships  . Social Herbalist on phone: Not on file    Gets together: Not on file    Attends religious service: Not on file    Active member of club or organization: Not on file    Attends meetings of clubs or organizations: Not on file    Relationship status: Not on file  . Intimate partner violence    Fear of current or ex partner: Not on file    Emotionally abused: Not on file    Physically abused: Not on file    Forced sexual activity: Not on file  Other Topics Concern  . Not on file  Social History Narrative  . Not on file    FAMILY HISTORY: Family History  Problem Relation Age of Onset  . Diabetes Mother   . Hypertension Mother   . Hyperlipidemia Mother   . Dementia Mother   . Breast cancer Neg Hx   . Ovarian cancer Neg Hx   . Colon cancer Neg Hx     ALLERGIES:  is allergic to abatacept; codeine; latex; shellfish allergy; and penicillins.  MEDICATIONS:  Current Outpatient Medications  Medication Sig Dispense Refill  . albuterol  (ACCUNEB) 1.25 MG/3ML nebulizer solution Take 1 ampule by nebulization every 6 (six) hours as needed for wheezing.    Marland Kitchen albuterol (PROVENTIL HFA;VENTOLIN HFA) 108 (90 Base) MCG/ACT inhaler Inhale 2 puffs into the lungs every 6 (six) hours as needed for wheezing or shortness of breath. 1 Inhaler 0  . amoxicillin-clavulanate (AUGMENTIN) 875-125 MG tablet Take 1 tablet by mouth 2 (two) times daily. 10 tablet 0  . aspirin EC 81 MG tablet Take 81 mg by mouth daily.    Marland Kitchen azelastine (ASTELIN) 0.1 % nasal spray Place 2 sprays into both nostrils 2 (two) times daily. 30 mL 5  . budesonide-formoterol (SYMBICORT) 160-4.5 MCG/ACT inhaler Inhale 2 puffs into the lungs 2 (two) times daily.    . calcium carbonate (OS-CAL - DOSED IN MG OF ELEMENTAL CALCIUM) 1250 (500 Ca) MG tablet Take by mouth.    Marland Kitchen  CALCIUM PO Take 1 tablet by mouth daily.    . cetirizine (ZYRTEC) 10 MG tablet Take 10 mg by mouth daily.    . Cholecalciferol (D3 ADULT PO) Take 1 capsule by mouth daily.    Marland Kitchen desloratadine (CLARINEX) 5 MG tablet Take 5 mg by mouth daily.    Marland Kitchen dexamethasone (DECADRON) 4 MG tablet Take 2 tablets by mouth once a day starting the day after chemotherapy. Continue for 3 days total. Take with food. 30 tablet 1  . docusate sodium (COLACE) 50 MG capsule Take 50 mg by mouth 2 (two) times daily.    Marland Kitchen EPINEPHrine 0.3 mg/0.3 mL IJ SOAJ injection Use as directed for severe allergic reaction. (Patient not taking: Reported on 10/25/2019) 2 Device 1  . Ferrous Sulfate (IRON PO) Take by mouth.    . fluconazole (DIFLUCAN) 150 MG tablet Take 1 tablet (150 mg) by mouth. Three days later, take second tablet. 2 tablet 0  . fluticasone (FLONASE) 50 MCG/ACT nasal spray Place into both nostrils daily.    Marland Kitchen guaiFENesin-dextromethorphan (ROBITUSSIN DM) 100-10 MG/5ML syrup Take 5 mLs by mouth every 4 (four) hours as needed for cough. 118 mL 0  . ipratropium (ATROVENT) 0.02 % nebulizer solution Take 0.5 mg by nebulization 4 (four) times daily.     Marland Kitchen leflunomide (ARAVA) 20 MG tablet Take 20 mg by mouth daily.    Marland Kitchen levothyroxine (SYNTHROID, LEVOTHROID) 88 MCG tablet Take 88 mcg by mouth daily before breakfast.    . lidocaine-prilocaine (EMLA) cream Apply to affected area once 30 g 3  . lisinopril (PRINIVIL,ZESTRIL) 40 MG tablet Take 40 mg by mouth daily.    . metoprolol succinate (TOPROL-XL) 50 MG 24 hr tablet Take 50 mg by mouth daily. Take with or immediately following a meal.    . montelukast (SINGULAIR) 10 MG tablet Take 10 mg by mouth at bedtime.    . norethindrone (AYGESTIN) 5 MG tablet Take 1 tablet (5 mg total) by mouth 2 (two) times daily for 10 days. As directed 20 tablet 0  . olopatadine (PATANOL) 0.1 % ophthalmic solution Place 1 drop into both eyes 2 (two) times daily. 5 mL 5  . omeprazole (PRILOSEC) 10 MG capsule Take 20 mg by mouth daily.    . pravastatin (PRAVACHOL) 40 MG tablet Take 40 mg by mouth daily.    . Prenatal Vit-Fe Fumarate-FA (MULTIVITAMIN-PRENATAL) 27-0.8 MG TABS tablet Take 1 tablet by mouth daily at 12 noon.    . prochlorperazine (COMPAZINE) 10 MG tablet Take 1 tablet (10 mg total) by mouth every 6 (six) hours as needed (Nausea or vomiting). 30 tablet 1  . senna-docusate (SENOKOT-S) 8.6-50 MG tablet Take by mouth.    . tiotropium (SPIRIVA) 18 MCG inhalation capsule Place 18 mcg into inhaler and inhale daily.    . traMADol (ULTRAM) 50 MG tablet Take 100 mg by mouth as needed (for rheumatoid arthritis).    . verapamil (CALAN) 120 MG tablet Take 240 mg by mouth 2 (two) times daily.     No current facility-administered medications for this visit.      PHYSICAL EXAMINATION: ECOG PERFORMANCE STATUS: 0 - Asymptomatic  Physical Exam Vitals signs and nursing note reviewed.  Constitutional:      General: She is not in acute distress. HENT:     Head: Normocephalic and atraumatic.  Eyes:     General: No scleral icterus.    Pupils: Pupils are equal, round, and reactive to light.  Neck:     Musculoskeletal:  Normal range of motion and neck supple.  Cardiovascular:     Rate and Rhythm: Normal rate and regular rhythm.     Heart sounds: Normal heart sounds.  Pulmonary:     Effort: Pulmonary effort is normal. No respiratory distress.     Breath sounds: No wheezing.  Abdominal:     General: Bowel sounds are normal. There is no distension.     Palpations: Abdomen is soft. There is no mass.     Tenderness: There is no abdominal tenderness.  Musculoskeletal: Normal range of motion.        General: No deformity.  Skin:    General: Skin is warm and dry.     Findings: No erythema or rash.  Neurological:     Mental Status: She is alert and oriented to person, place, and time.     Cranial Nerves: No cranial nerve deficit.     Coordination: Coordination normal.  Psychiatric:        Behavior: Behavior normal.        Thought Content: Thought content normal.     LABORATORY DATA:  I have reviewed the data as listed Lab Results  Component Value Date   WBC 11.2 (H) 06/08/2019   HGB 11.2 (L) 06/08/2019   HCT 36.4 06/08/2019   MCV 84.7 06/08/2019   PLT 258 06/08/2019   Recent Labs    11/26/18 1746 06/08/19 1543 10/20/19 0913  NA 141 138  --   K 3.7 4.3  --   CL 107 103  --   CO2 26 26  --   GLUCOSE 160* 117*  --   BUN 13 15  --   CREATININE 0.75 0.96 0.70  CALCIUM 9.3 9.5  --   GFRNONAA >60 >60  --   GFRAA >60 >60  --   PROT  --  7.9  --   ALBUMIN  --  3.8  --   AST  --  17  --   ALT  --  12  --   ALKPHOS  --  65  --   BILITOT  --  0.5  --    Iron/TIBC/Ferritin/ %Sat No results found for: IRON, TIBC, FERRITIN, IRONPCTSAT    RADIOGRAPHIC STUDIES: I have personally reviewed the radiological images as listed and agreed with the findings in the report. Ct Chest W Contrast  Result Date: 10/20/2019 CLINICAL DATA:  Follow-up endometrial cancer, status post hysterectomy, no adjuvant therapy EXAM: CT CHEST, ABDOMEN, AND PELVIS WITH CONTRAST TECHNIQUE: Multidetector CT imaging of the  chest, abdomen and pelvis was performed following the standard protocol during bolus administration of intravenous contrast. CONTRAST:  158m OMNIPAQUE IOHEXOL 300 MG/ML SOLN, additional oral enteric contrast COMPARISON:  CT chest abdomen pelvis, 06/13/2019, CT chest angiogram, 10/14/2018 FINDINGS: CT CHEST FINDINGS Cardiovascular: Right chest port catheter. Aortic atherosclerosis. Normal heart size. No pericardial effusion. Mediastinum/Nodes: No enlarged mediastinal, hilar, or axillary lymph nodes. Thyroid gland, trachea, and esophagus demonstrate no significant findings. Lungs/Pleura: Unchanged 4 mm ground-glass pulmonary nodule of the left pulmonary apex (series 3, image 24). No pleural effusion or pneumothorax. Musculoskeletal: No chest wall mass or suspicious bone lesions identified. CT ABDOMEN PELVIS FINDINGS Hepatobiliary: No focal liver abnormality is seen. Status post cholecystectomy. No biliary dilatation. Pancreas: Unremarkable. No pancreatic ductal dilatation or surrounding inflammatory changes. Spleen: Normal in size without significant abnormality. Adrenals/Urinary Tract: Adrenal glands are unremarkable. Kidneys are normal, without renal calculi, solid lesion, or hydronephrosis. Bladder is unremarkable. Stomach/Bowel: Stomach is within normal limits. Appendix  appears normal. No evidence of bowel wall thickening, distention, or inflammatory changes. Descending colonic diverticula. Vascular/Lymphatic: Aortic atherosclerosis. There are multiple newly enlarged retroperitoneal and right iliac lymph nodes, the largest left retroperitoneal node measuring 1.9 x 1.6 cm (series 2, image 71). Reproductive: Status post hysterectomy. Other: No abdominal wall hernia or abnormality. No abdominopelvic ascites. Musculoskeletal: No acute or significant osseous findings. IMPRESSION: 1.  Status post interval hysterectomy. 2. There are multiple newly enlarged retroperitoneal and right iliac lymph nodes, the largest left  retroperitoneal node measuring 1.9 x 1.6 cm (series 2, image 71), concerning for nodal metastatic disease. 3. Unchanged 4 mm ground-glass pulmonary nodule of the left pulmonary apex (series 3, image 24). This remains nonspecific although is again an unlikely manifestation of pulmonary metastatic disease. 4. Other chronic, incidental, and postoperative findings as above. Aortic Atherosclerosis (ICD10-I70.0). Electronically Signed   By: Eddie Candle M.D.   On: 10/20/2019 10:25   Ct Abdomen Pelvis W Contrast  Result Date: 10/20/2019 CLINICAL DATA:  Follow-up endometrial cancer, status post hysterectomy, no adjuvant therapy EXAM: CT CHEST, ABDOMEN, AND PELVIS WITH CONTRAST TECHNIQUE: Multidetector CT imaging of the chest, abdomen and pelvis was performed following the standard protocol during bolus administration of intravenous contrast. CONTRAST:  151m OMNIPAQUE IOHEXOL 300 MG/ML SOLN, additional oral enteric contrast COMPARISON:  CT chest abdomen pelvis, 06/13/2019, CT chest angiogram, 10/14/2018 FINDINGS: CT CHEST FINDINGS Cardiovascular: Right chest port catheter. Aortic atherosclerosis. Normal heart size. No pericardial effusion. Mediastinum/Nodes: No enlarged mediastinal, hilar, or axillary lymph nodes. Thyroid gland, trachea, and esophagus demonstrate no significant findings. Lungs/Pleura: Unchanged 4 mm ground-glass pulmonary nodule of the left pulmonary apex (series 3, image 24). No pleural effusion or pneumothorax. Musculoskeletal: No chest wall mass or suspicious bone lesions identified. CT ABDOMEN PELVIS FINDINGS Hepatobiliary: No focal liver abnormality is seen. Status post cholecystectomy. No biliary dilatation. Pancreas: Unremarkable. No pancreatic ductal dilatation or surrounding inflammatory changes. Spleen: Normal in size without significant abnormality. Adrenals/Urinary Tract: Adrenal glands are unremarkable. Kidneys are normal, without renal calculi, solid lesion, or hydronephrosis. Bladder is  unremarkable. Stomach/Bowel: Stomach is within normal limits. Appendix appears normal. No evidence of bowel wall thickening, distention, or inflammatory changes. Descending colonic diverticula. Vascular/Lymphatic: Aortic atherosclerosis. There are multiple newly enlarged retroperitoneal and right iliac lymph nodes, the largest left retroperitoneal node measuring 1.9 x 1.6 cm (series 2, image 71). Reproductive: Status post hysterectomy. Other: No abdominal wall hernia or abnormality. No abdominopelvic ascites. Musculoskeletal: No acute or significant osseous findings. IMPRESSION: 1.  Status post interval hysterectomy. 2. There are multiple newly enlarged retroperitoneal and right iliac lymph nodes, the largest left retroperitoneal node measuring 1.9 x 1.6 cm (series 2, image 71), concerning for nodal metastatic disease. 3. Unchanged 4 mm ground-glass pulmonary nodule of the left pulmonary apex (series 3, image 24). This remains nonspecific although is again an unlikely manifestation of pulmonary metastatic disease. 4. Other chronic, incidental, and postoperative findings as above. Aortic Atherosclerosis (ICD10-I70.0). Electronically Signed   By: AEddie CandleM.D.   On: 10/20/2019 10:25    ASSESSMENT & PLAN:  1. Endometrial cancer ([place]   2. Rheumatoid arthritis, involving unspecified site, unspecified whether rheumatoid factor present ([place]   3. Cardiomyopathy, unspecified type ([place]   4. Acute non-recurrent sinusitis, unspecified location   5. Goals of care, counseling/discussion    #Recurrent endometrial cancer. CT images were independently reviewed and discussed with patient and her husband. we discussed about recommendation of systemic therapy with carboplatin and Taxol Goal of care-cure, discussed. I explained  to the patient the risks and benefits of chemotherapy carboplatin and Taxol including all but not limited to infusion reaction, hair loss, hearing loss, mouth sore, nausea, vomiting, low  blood counts, bleeding, heart failure, kidney failure, neuropathy and risk of life threatening infection and even death, secondary malignancy etc.   Plan 3-4 cycles of carboplatin Taxol every 3 weeks with growth factor support. We will repeat CT scan afterwards for evaluation of treatment response. Patient voices understanding and she wishes to consider about it and let me know. Plan was discussed with GYN oncology Dr. Theora Gianotti who is in agreement. There might be also a role for radiation therapy and after systemic therapy, I will send patient to radiation oncology for evaluation.  #Paitent called on 10/27/2019 and expressed willingness to proceed with chemotherapy. # Chemotherapy education;Antiemetics-Zofran and Compazine; EMLA cream sent to pharmacy  #Port-A-Cath in place, patient reports that Mediport was placed in [place] a few years ago for rheumatology treatments. Patient has not had any port flush since the last years about 2 years ago.  No focal swelling or discomfort. We will arrange patient to proceed with baseline blood work and also port flush. Check baseline CBC, CMP, CA-125.  #Sinus infection, patient requesting course of antibiotics.  She does not want to see primary care provider for sinus infection. Given that patient is going to start chemotherapy in the next 1 to 2 weeks, would prefer to achieve good control of sinus infection. She is allergic to penicillin, she reports that she is able to take amoxicillin in the past. A prescription of Augmentin 875-139m twice daily for 5 days prescription sent to pharmacy.  #Cardiomyopathy, LVEF35 to 40% Last seen by cardiology 04/26/2019.  Recommend patient continue follow-up with cardiology. #Rheumatoid arthritis, continue follow-up rheumatology.  Chronic joint pain.  Return of visit: 1 week for assessment before starting treatments. ZEarlie Server MD, PhD Hematology Oncology [facility]villeat ALiving[facility] Regional HospitalPager- [phone] 10/29/2019

## 2019-10-31 ENCOUNTER — Other Ambulatory Visit
Admission: RE | Admit: 2019-10-31 | Discharge: 2019-10-31 | Disposition: A | Discharge status: Home / Self Care | Payer: Medicare Other | Source: Ambulatory Visit | Attending: Oncology | Admitting: Oncology

## 2019-10-31 ENCOUNTER — Inpatient Hospital Stay: Payer: Medicare Other | Attending: Oncology

## 2019-10-31 ENCOUNTER — Other Ambulatory Visit: Payer: Medicare Other

## 2019-10-31 ENCOUNTER — Other Ambulatory Visit: Payer: Self-pay

## 2019-10-31 ENCOUNTER — Inpatient Hospital Stay: Payer: Medicare Other

## 2019-10-31 ENCOUNTER — Inpatient Hospital Stay (HOSPITAL_BASED_OUTPATIENT_CLINIC_OR_DEPARTMENT_OTHER): Payer: Medicare Other | Admitting: Nurse Practitioner

## 2019-10-31 ENCOUNTER — Encounter: Payer: Self-pay | Admitting: Oncology

## 2019-10-31 DIAGNOSIS — I429 Cardiomyopathy, unspecified: Secondary | ICD-10-CM | POA: Diagnosis not present

## 2019-10-31 DIAGNOSIS — Z5189 Encounter for other specified aftercare: Secondary | ICD-10-CM | POA: Diagnosis not present

## 2019-10-31 DIAGNOSIS — C541 Malignant neoplasm of endometrium: Secondary | ICD-10-CM

## 2019-10-31 DIAGNOSIS — D509 Iron deficiency anemia, unspecified: Secondary | ICD-10-CM | POA: Insufficient documentation

## 2019-10-31 DIAGNOSIS — G2581 Restless legs syndrome: Secondary | ICD-10-CM | POA: Insufficient documentation

## 2019-10-31 DIAGNOSIS — Z452 Encounter for adjustment and management of vascular access device: Secondary | ICD-10-CM | POA: Insufficient documentation

## 2019-10-31 DIAGNOSIS — Z5111 Encounter for antineoplastic chemotherapy: Secondary | ICD-10-CM | POA: Diagnosis present

## 2019-10-31 DIAGNOSIS — K219 Gastro-esophageal reflux disease without esophagitis: Secondary | ICD-10-CM | POA: Diagnosis not present

## 2019-10-31 DIAGNOSIS — M069 Rheumatoid arthritis, unspecified: Secondary | ICD-10-CM | POA: Diagnosis not present

## 2019-10-31 DIAGNOSIS — D473 Essential (hemorrhagic) thrombocythemia: Secondary | ICD-10-CM | POA: Insufficient documentation

## 2019-10-31 DIAGNOSIS — Z95828 Presence of other vascular implants and grafts: Secondary | ICD-10-CM

## 2019-10-31 DIAGNOSIS — Z01812 Encounter for preprocedural laboratory examination: Secondary | ICD-10-CM | POA: Diagnosis present

## 2019-10-31 DIAGNOSIS — Z20828 Contact with and (suspected) exposure to other viral communicable diseases: Secondary | ICD-10-CM | POA: Insufficient documentation

## 2019-10-31 DIAGNOSIS — E079 Disorder of thyroid, unspecified: Secondary | ICD-10-CM | POA: Insufficient documentation

## 2019-10-31 DIAGNOSIS — A6004 Herpesviral vulvovaginitis: Secondary | ICD-10-CM | POA: Diagnosis not present

## 2019-10-31 MED ORDER — SODIUM CHLORIDE 0.9% FLUSH
10.0000 mL | Freq: Once | INTRAVENOUS | Status: AC
  Administered 2019-10-31: 10 mL via INTRAVENOUS
  Filled 2019-10-31: qty 10

## 2019-10-31 MED ORDER — HEPARIN SOD (PORK) LOCK FLUSH 100 UNIT/ML IV SOLN
500.0000 [IU] | Freq: Once | INTRAVENOUS | Status: AC
  Administered 2019-10-31: 500 [IU] via INTRAVENOUS

## 2019-10-31 NOTE — Progress Notes (Signed)
[facility]  Telephone:([phone] [phone]  Patient Care Team: Felipa Eth, MD as PCP - General (Internal Medicine) Clent Jacks, RN as Oncology Nurse Navigator   Name of the patient: Sandra Fischer  ON:[id number]  1951-12-26   Date of visit: 10/31/19  Diagnosis-stage IIIC1 serous endometrial cancer  Chief complaint/Reason for visit- Initial Meeting for [facility], preparing for starting chemotherapy  Heme/Onc history:  Oncology History  Endometrial cancer ([place])  07/15/2019 Initial Diagnosis   Endometrial cancer ([place])   11/07/2019 -  Chemotherapy   The patient had palonosetron (ALOXI) injection 0.25 mg, 0.25 mg, Intravenous,  Once, 0 of 6 cycles pegfilgrastim-jmdb (FULPHILA) injection 6 mg, 6 mg, Subcutaneous,  Once, 0 of 6 cycles CARBOplatin (PARAPLATIN) 480 mg in sodium chloride 0.9 % 250 mL chemo infusion, 480 mg (original dose ), Intravenous,  Once, 0 of 6 cycles Dose modification:   (Cycle 1) PACLitaxel (TAXOL) 330 mg in sodium chloride 0.9 % 500 mL chemo infusion (> 80mg /m2), 175 mg/m2, Intravenous,  Once, 0 of 6 cycles fosaprepitant (EMEND) 150 mg, dexamethasone (DECADRON) 12 mg in sodium chloride 0.9 % 145 mL IVPB, , Intravenous,  Once, 0 of 6 cycles  for chemotherapy treatment.      Interval history-Phung Reigel, 68 year old female with above history of stage III C1 serous endometrial cancer, who presents to chemo care clinic today for initial meeting in preparation for starting chemotherapy. I introduced the chemo care clinic and we discussed that the role of the clinic is to assist those who are at an increased risk of emergency room visits and/or complications during the course of chemotherapy treatment. We discussed that the increased risk takes into account factors such as age, performance status, and co-morbidities. We also discussed that for some, this might include barriers to care such  as not having a primary care provider, lack of insurance/transportation, or not being able to afford medications. We discussed that the goal of the program is to help prevent unplanned ER visits and help reduce complications during chemotherapy. We do this by discussing specific risk factors to each individual and identifying ways that we can help improve these risk factors and reduce barriers to care.  She was identified as low risk based on Medicare status, history of anemia, history of asthma, history of rheumatoid arthritis.  ECOG FS:2 - Symptomatic, <50% confined to bed  Review of systems- Review of Systems  Constitutional: Negative for chills, fever, malaise/fatigue and weight loss.  HENT: Negative for hearing loss, nosebleeds, sore throat and tinnitus.   Eyes: Negative for blurred vision and double vision.  Respiratory: Negative for cough, hemoptysis, shortness of breath and wheezing.   Cardiovascular: Negative for chest pain, palpitations and leg swelling.  Gastrointestinal: Negative for abdominal pain, blood in stool, constipation, diarrhea, melena, nausea and vomiting.  Genitourinary: Negative for dysuria and urgency.  Musculoskeletal: Negative for back pain, falls, joint pain and myalgias.  Skin: Negative for itching and rash.  Neurological: Negative for dizziness, tingling, sensory change, loss of consciousness, weakness and headaches.  Endo/Heme/Allergies: Negative for environmental allergies. Does not bruise/bleed easily.  Psychiatric/Behavioral: Negative for depression. The patient is nervous/anxious. The patient does not have insomnia.      Current treatment-carbo-Taxol  Allergies  Allergen Reactions   Abatacept Hives   Codeine Hives, Nausea And Vomiting and Nausea Only   Latex Itching and Hives   Shellfish Allergy Swelling   Penicillins Nausea And Vomiting    Past  Medical History:  Diagnosis Date   Asthma    Asthma    Cancer ([place])    Collagen vascular  disease ([place])    Diabetes mellitus without complication ([place])    History of Diabetes    Environmental allergies    Fibromyalgia    High cholesterol    Hypertension    RA (rheumatoid arthritis) ([place])    Thyroid disease     Past Surgical History:  Procedure Laterality Date   CATARACT EXTRACTION     CHOLECYSTECTOMY     fibroids removed     THYROIDECTOMY, PARTIAL     tummy tuck      Social History   Socioeconomic History   Marital status: Married    Spouse name: Not on file   Number of children: Not on file   Years of education: Not on file   Highest education level: Not on file  Occupational History   Not on file  Social Needs   Financial resource strain: Not on file   Food insecurity    Worry: Not on file    Inability: Not on file   Transportation needs    Medical: Not on file    Non-medical: Not on file  Tobacco Use   Smoking status: Never Smoker   Smokeless tobacco: Never Used  Substance and Sexual Activity   Alcohol use: No   Drug use: No   Sexual activity: Yes    Birth control/protection: Post-menopausal  Lifestyle   Physical activity    Days per week: Not on file    Minutes per session: Not on file   Stress: Not on file  Relationships   Social connections    Talks on phone: Not on file    Gets together: Not on file    Attends religious service: Not on file    Active member of club or organization: Not on file    Attends meetings of clubs or organizations: Not on file    Relationship status: Not on file   Intimate partner violence    Fear of current or ex partner: Not on file    Emotionally abused: Not on file    Physically abused: Not on file    Forced sexual activity: Not on file  Other Topics Concern   Not on file  Social History Narrative   Not on file    Family History  Problem Relation Age of Onset   Diabetes Mother    Hypertension Mother    Hyperlipidemia Mother    Dementia Mother    Breast cancer  Neg Hx    Ovarian cancer Neg Hx    Colon cancer Neg Hx      Current Outpatient Medications:    albuterol (ACCUNEB) 1.25 MG/3ML nebulizer solution, Take 1 ampule by nebulization every 6 (six) hours as needed for wheezing., Disp: , Rfl:    albuterol (PROVENTIL HFA;VENTOLIN HFA) 108 (90 Base) MCG/ACT inhaler, Inhale 2 puffs into the lungs every 6 (six) hours as needed for wheezing or shortness of breath., Disp: 1 Inhaler, Rfl: 0   amoxicillin-clavulanate (AUGMENTIN) 875-125 MG tablet, Take 1 tablet by mouth 2 (two) times daily., Disp: 10 tablet, Rfl: 0   aspirin EC 81 MG tablet, Take 81 mg by mouth daily., Disp: , Rfl:    azelastine (ASTELIN) 0.1 % nasal spray, Place 2 sprays into both nostrils 2 (two) times daily., Disp: 30 mL, Rfl: 5   budesonide-formoterol (SYMBICORT) 160-4.5 MCG/ACT inhaler, Inhale 2 puffs into the lungs 2 (  two) times daily., Disp: , Rfl:    calcium carbonate (OS-CAL - DOSED IN MG OF ELEMENTAL CALCIUM) 1250 (500 Ca) MG tablet, Take by mouth., Disp: , Rfl:    CALCIUM PO, Take 1 tablet by mouth daily., Disp: , Rfl:    cetirizine (ZYRTEC) 10 MG tablet, Take 10 mg by mouth daily., Disp: , Rfl:    Cholecalciferol (D3 ADULT PO), Take 1 capsule by mouth daily., Disp: , Rfl:    desloratadine (CLARINEX) 5 MG tablet, Take 5 mg by mouth daily., Disp: , Rfl:    dexamethasone (DECADRON) 4 MG tablet, Take 2 tablets by mouth once a day starting the day after chemotherapy. Continue for 3 days total. Take with food., Disp: 30 tablet, Rfl: 1   docusate sodium (COLACE) 50 MG capsule, Take 50 mg by mouth 2 (two) times daily., Disp: , Rfl:    EPINEPHrine 0.3 mg/0.3 mL IJ SOAJ injection, Use as directed for severe allergic reaction. (Patient not taking: Reported on 10/25/2019), Disp: 2 Device, Rfl: 1   Ferrous Sulfate (IRON PO), Take by mouth., Disp: , Rfl:    fluconazole (DIFLUCAN) 150 MG tablet, Take 1 tablet (150 mg) by mouth. Three days later, take second tablet., Disp: 2  tablet, Rfl: 0   fluticasone (FLONASE) 50 MCG/ACT nasal spray, Place into both nostrils daily., Disp: , Rfl:    guaiFENesin-dextromethorphan (ROBITUSSIN DM) 100-10 MG/5ML syrup, Take 5 mLs by mouth every 4 (four) hours as needed for cough., Disp: 118 mL, Rfl: 0   ipratropium (ATROVENT) 0.02 % nebulizer solution, Take 0.5 mg by nebulization 4 (four) times daily., Disp: , Rfl:    leflunomide (ARAVA) 20 MG tablet, Take 20 mg by mouth daily., Disp: , Rfl:    levothyroxine (SYNTHROID, LEVOTHROID) 88 MCG tablet, Take 88 mcg by mouth daily before breakfast., Disp: , Rfl:    lidocaine-prilocaine (EMLA) cream, Apply to affected area once, Disp: 30 g, Rfl: 3   lisinopril (PRINIVIL,ZESTRIL) 40 MG tablet, Take 40 mg by mouth daily., Disp: , Rfl:    metoprolol succinate (TOPROL-XL) 50 MG 24 hr tablet, Take 50 mg by mouth daily. Take with or immediately following a meal., Disp: , Rfl:    montelukast (SINGULAIR) 10 MG tablet, Take 10 mg by mouth at bedtime., Disp: , Rfl:    norethindrone (AYGESTIN) 5 MG tablet, Take 1 tablet (5 mg total) by mouth 2 (two) times daily for 10 days. As directed, Disp: 20 tablet, Rfl: 0   olopatadine (PATANOL) 0.1 % ophthalmic solution, Place 1 drop into both eyes 2 (two) times daily., Disp: 5 mL, Rfl: 5   omeprazole (PRILOSEC) 10 MG capsule, Take 20 mg by mouth daily., Disp: , Rfl:    pravastatin (PRAVACHOL) 40 MG tablet, Take 40 mg by mouth daily., Disp: , Rfl:    Prenatal Vit-Fe Fumarate-FA (MULTIVITAMIN-PRENATAL) 27-0.8 MG TABS tablet, Take 1 tablet by mouth daily at 12 noon., Disp: , Rfl:    prochlorperazine (COMPAZINE) 10 MG tablet, Take 1 tablet (10 mg total) by mouth every 6 (six) hours as needed (Nausea or vomiting)., Disp: 30 tablet, Rfl: 1   senna-docusate (SENOKOT-S) 8.6-50 MG tablet, Take by mouth., Disp: , Rfl:    tiotropium (SPIRIVA) 18 MCG inhalation capsule, Place 18 mcg into inhaler and inhale daily., Disp: , Rfl:    traMADol (ULTRAM) 50 MG tablet,  Take 100 mg by mouth as needed (for rheumatoid arthritis)., Disp: , Rfl:    verapamil (CALAN) 120 MG tablet, Take 240 mg by mouth 2 (  two) times daily., Disp: , Rfl:   Physical exam: There were no vitals filed for this visit. Physical Exam Constitutional:      General: She is not in acute distress.    Appearance: Normal appearance. She is well-developed.     Comments: Accompanied by husband. Wearing mask.  HENT:     Head: Atraumatic.     Nose: Nose normal.     Mouth/Throat:     Pharynx: No oropharyngeal exudate.  Eyes:     General: No scleral icterus.    Conjunctiva/sclera: Conjunctivae normal.  Abdominal:     General: There is no distension.  Skin:    General: Skin is warm and dry.  Neurological:     Mental Status: She is alert and oriented to person, place, and time.  Psychiatric:        Mood and Affect: Mood normal.        Behavior: Behavior normal.      CMP Latest Ref Rng & Units 10/20/2019  Glucose 70 - 99 mg/dL -  BUN 8 - 23 mg/dL -  Creatinine 0.44 - 1.00 mg/dL 0.70  Sodium 135 - 145 mmol/L -  Potassium 3.5 - 5.1 mmol/L -  Chloride 98 - 111 mmol/L -  CO2 22 - 32 mmol/L -  Calcium 8.9 - 10.3 mg/dL -  Total Protein 6.5 - 8.1 g/dL -  Total Bilirubin 0.3 - 1.2 mg/dL -  Alkaline Phos 38 - 126 U/L -  AST 15 - 41 U/L -  ALT 0 - 44 U/L -   CBC Latest Ref Rng & Units 06/08/2019  WBC 4.0 - 10.5 K/uL 11.2(H)  Hemoglobin 12.0 - 15.0 g/dL 11.2(L)  Hematocrit 36.0 - 46.0 % 36.4  Platelets 150 - 400 K/uL 258    No images are attached to the encounter.  Ct Chest W Contrast  Result Date: 10/20/2019 CLINICAL DATA:  Follow-up endometrial cancer, status post hysterectomy, no adjuvant therapy EXAM: CT CHEST, ABDOMEN, AND PELVIS WITH CONTRAST TECHNIQUE: Multidetector CT imaging of the chest, abdomen and pelvis was performed following the standard protocol during bolus administration of intravenous contrast. CONTRAST:  171mL OMNIPAQUE IOHEXOL 300 MG/ML SOLN, additional oral  enteric contrast COMPARISON:  CT chest abdomen pelvis, 06/13/2019, CT chest angiogram, 10/14/2018 FINDINGS: CT CHEST FINDINGS Cardiovascular: Right chest port catheter. Aortic atherosclerosis. Normal heart size. No pericardial effusion. Mediastinum/Nodes: No enlarged mediastinal, hilar, or axillary lymph nodes. Thyroid gland, trachea, and esophagus demonstrate no significant findings. Lungs/Pleura: Unchanged 4 mm ground-glass pulmonary nodule of the left pulmonary apex (series 3, image 24). No pleural effusion or pneumothorax. Musculoskeletal: No chest wall mass or suspicious bone lesions identified. CT ABDOMEN PELVIS FINDINGS Hepatobiliary: No focal liver abnormality is seen. Status post cholecystectomy. No biliary dilatation. Pancreas: Unremarkable. No pancreatic ductal dilatation or surrounding inflammatory changes. Spleen: Normal in size without significant abnormality. Adrenals/Urinary Tract: Adrenal glands are unremarkable. Kidneys are normal, without renal calculi, solid lesion, or hydronephrosis. Bladder is unremarkable. Stomach/Bowel: Stomach is within normal limits. Appendix appears normal. No evidence of bowel wall thickening, distention, or inflammatory changes. Descending colonic diverticula. Vascular/Lymphatic: Aortic atherosclerosis. There are multiple newly enlarged retroperitoneal and right iliac lymph nodes, the largest left retroperitoneal node measuring 1.9 x 1.6 cm (series 2, image 71). Reproductive: Status post hysterectomy. Other: No abdominal wall hernia or abnormality. No abdominopelvic ascites. Musculoskeletal: No acute or significant osseous findings. IMPRESSION: 1.  Status post interval hysterectomy. 2. There are multiple newly enlarged retroperitoneal and right iliac lymph nodes, the largest left  retroperitoneal node measuring 1.9 x 1.6 cm (series 2, image 71), concerning for nodal metastatic disease. 3. Unchanged 4 mm ground-glass pulmonary nodule of the left pulmonary apex (series 3,  image 24). This remains nonspecific although is again an unlikely manifestation of pulmonary metastatic disease. 4. Other chronic, incidental, and postoperative findings as above. Aortic Atherosclerosis (ICD10-I70.0). Electronically Signed   By: Eddie Candle M.D.   On: 10/20/2019 10:25   Ct Abdomen Pelvis W Contrast  Result Date: 10/20/2019 CLINICAL DATA:  Follow-up endometrial cancer, status post hysterectomy, no adjuvant therapy EXAM: CT CHEST, ABDOMEN, AND PELVIS WITH CONTRAST TECHNIQUE: Multidetector CT imaging of the chest, abdomen and pelvis was performed following the standard protocol during bolus administration of intravenous contrast. CONTRAST:  157mL OMNIPAQUE IOHEXOL 300 MG/ML SOLN, additional oral enteric contrast COMPARISON:  CT chest abdomen pelvis, 06/13/2019, CT chest angiogram, 10/14/2018 FINDINGS: CT CHEST FINDINGS Cardiovascular: Right chest port catheter. Aortic atherosclerosis. Normal heart size. No pericardial effusion. Mediastinum/Nodes: No enlarged mediastinal, hilar, or axillary lymph nodes. Thyroid gland, trachea, and esophagus demonstrate no significant findings. Lungs/Pleura: Unchanged 4 mm ground-glass pulmonary nodule of the left pulmonary apex (series 3, image 24). No pleural effusion or pneumothorax. Musculoskeletal: No chest wall mass or suspicious bone lesions identified. CT ABDOMEN PELVIS FINDINGS Hepatobiliary: No focal liver abnormality is seen. Status post cholecystectomy. No biliary dilatation. Pancreas: Unremarkable. No pancreatic ductal dilatation or surrounding inflammatory changes. Spleen: Normal in size without significant abnormality. Adrenals/Urinary Tract: Adrenal glands are unremarkable. Kidneys are normal, without renal calculi, solid lesion, or hydronephrosis. Bladder is unremarkable. Stomach/Bowel: Stomach is within normal limits. Appendix appears normal. No evidence of bowel wall thickening, distention, or inflammatory changes. Descending colonic diverticula.  Vascular/Lymphatic: Aortic atherosclerosis. There are multiple newly enlarged retroperitoneal and right iliac lymph nodes, the largest left retroperitoneal node measuring 1.9 x 1.6 cm (series 2, image 71). Reproductive: Status post hysterectomy. Other: No abdominal wall hernia or abnormality. No abdominopelvic ascites. Musculoskeletal: No acute or significant osseous findings. IMPRESSION: 1.  Status post interval hysterectomy. 2. There are multiple newly enlarged retroperitoneal and right iliac lymph nodes, the largest left retroperitoneal node measuring 1.9 x 1.6 cm (series 2, image 71), concerning for nodal metastatic disease. 3. Unchanged 4 mm ground-glass pulmonary nodule of the left pulmonary apex (series 3, image 24). This remains nonspecific although is again an unlikely manifestation of pulmonary metastatic disease. 4. Other chronic, incidental, and postoperative findings as above. Aortic Atherosclerosis (ICD10-I70.0). Electronically Signed   By: Eddie Candle M.D.   On: 10/20/2019 10:25     Assessment and plan- Patient is a 68 y.o. female who presents to [facility] for initial meeting in preparation for starting chemotherapy for the treatment of    1. Stage IIIc serous endometrial cancer-status post laparotomy with myomectomy for leiomyoma.  Based on pathology adjuvant chemotherapy and radiation were recommended but patient declined treatment.  Interval imaging showed enlargement of retroperitoneal and right iliac lymph nodes concerning for nodal metastatic disease.  Patient has now elected to proceed with chemotherapy.   2. Port-A-Cath- She has port and placed for rheumatology treatments.  She has brought EMLA cream with her to clinic today but has not yet applied.  She agrees to proceed with accessing her port without cream today.  Nursing will attempt to draw back blood and flush.  3. Chemo Care Clinic/High Risk for ER/Hospitalization during chemotherapy- We discussed the role of the chemo  care clinic and identified patient specific risk factors. I discussed that patient was identified  as low risk primarily based on Medicare status, anemia, asthma, rheumatoid arthritis.  Patient currently has a primary care provider whom she sees regularly.  Prior hospitalizations and ER visits secondary to her diagnosis.  Anemia will be monitored by Dr. [name] during her treatments.  She continues supplemental iron.  Asthma has been well controlled without recent exacerbations.  Noted in chart as history of moderate persistent asthma for which she takes Spiriva and Atrovent.  Also on Singulair.  She has rescue inhaler as needed.  She also has nebulizer at home.  Asthma complicated by seasonal allergies per her report.  On antibiotics per Dr.Yu for recent sinus infection.  Rheumatoid arthritis currently well controlled and managed by rheumatology. 4. Social Determinants of Health- we discussed that social determinants of health may have significant impacts on health and outcomes for cancer patients.  Today we discussed specific social determinants of performance status, alcohol use, depression, financial needs, food insecurity, housing, interpersonal violence, social connections, stress, tobacco use, and transportation.  After lengthy discussion she denies needing specific assistance.  We discussed services available through the cancer center and she will notify her primary team if assistance is needed.  She cites her primary stress is concerned of traveling schedule while her mother is in hospice care and requires 24-hour caregiver.  She identifies her husband is her primary support system. 5. Palliative Care- based on stage of cancer and/or identified needs today, I will refer patient to palliative care for goals of care and advanced care planning.  We also discussed the role of the Sympt[facility] at [facility] for acute issues and methods of contacting clinic/provider. She denies needing specific assistance at  this time and She will be followed by Mariea Clonts, RN (Nurse Navigator).   Visit Diagnosis 1. Endometrial cancer ([place])   2. Port-A-Cath in place    Patient expressed understanding and was in agreement with this plan. She also understands that She can call clinic at any time with any questions, concerns, or complaints.   A total of (15) minutes of face-to-face time was spent with this patient with greater than 50% of that time in counseling and care-coordination.  Beckey Rutter, DNP, AGNP-C [facility]

## 2019-11-01 ENCOUNTER — Telehealth: Payer: Self-pay | Admitting: *Deleted

## 2019-11-01 LAB — SARS CORONAVIRUS 2 (TAT 6-24 HRS): SARS Coronavirus 2: NEGATIVE

## 2019-11-01 NOTE — Telephone Encounter (Signed)
Patient called reporting that she has "an itch" and wants to know what over the counter medicine she can use for it.

## 2019-11-01 NOTE — Telephone Encounter (Addendum)
Patient was on amoxicillin last week for a sinus infection and now has a rash on the inside of both her thighs formed 2 days ago and the itching started on her labia  2 days

## 2019-11-01 NOTE — Telephone Encounter (Signed)
Please contact her PCP for further evaluation

## 2019-11-01 NOTE — Telephone Encounter (Signed)
Could you please call patient to get more details about her itch?

## 2019-11-02 NOTE — Telephone Encounter (Signed)
Patient informed to call her PCP and she repeated back to me

## 2019-11-02 NOTE — Telephone Encounter (Signed)
error 

## 2019-11-04 ENCOUNTER — Other Ambulatory Visit: Payer: Self-pay

## 2019-11-04 ENCOUNTER — Encounter: Payer: Self-pay | Admitting: Nurse Practitioner

## 2019-11-04 DIAGNOSIS — Z95828 Presence of other vascular implants and grafts: Secondary | ICD-10-CM | POA: Insufficient documentation

## 2019-11-04 NOTE — Progress Notes (Signed)
Patient pre screened for office appointment, no questions or concerns today. 

## 2019-11-07 ENCOUNTER — Inpatient Hospital Stay: Payer: Medicare Other

## 2019-11-07 ENCOUNTER — Other Ambulatory Visit: Payer: Self-pay | Admitting: Oncology

## 2019-11-07 ENCOUNTER — Other Ambulatory Visit: Payer: Self-pay | Admitting: *Deleted

## 2019-11-07 ENCOUNTER — Encounter: Payer: Self-pay | Admitting: Oncology

## 2019-11-07 ENCOUNTER — Inpatient Hospital Stay (HOSPITAL_BASED_OUTPATIENT_CLINIC_OR_DEPARTMENT_OTHER): Payer: Medicare Other | Admitting: Oncology

## 2019-11-07 ENCOUNTER — Other Ambulatory Visit: Payer: Self-pay

## 2019-11-07 VITALS — BP 140/89 | HR 76 | Resp 18

## 2019-11-07 VITALS — BP 145/84 | HR 71 | Temp 97.6°F | Resp 16 | Wt 175.1 lb

## 2019-11-07 DIAGNOSIS — J019 Acute sinusitis, unspecified: Secondary | ICD-10-CM

## 2019-11-07 DIAGNOSIS — D509 Iron deficiency anemia, unspecified: Secondary | ICD-10-CM

## 2019-11-07 DIAGNOSIS — C541 Malignant neoplasm of endometrium: Secondary | ICD-10-CM

## 2019-11-07 DIAGNOSIS — I429 Cardiomyopathy, unspecified: Secondary | ICD-10-CM | POA: Diagnosis not present

## 2019-11-07 DIAGNOSIS — M069 Rheumatoid arthritis, unspecified: Secondary | ICD-10-CM

## 2019-11-07 DIAGNOSIS — D649 Anemia, unspecified: Secondary | ICD-10-CM

## 2019-11-07 DIAGNOSIS — D75839 Thrombocytosis, unspecified: Secondary | ICD-10-CM

## 2019-11-07 DIAGNOSIS — Z95828 Presence of other vascular implants and grafts: Secondary | ICD-10-CM

## 2019-11-07 DIAGNOSIS — Z7189 Other specified counseling: Secondary | ICD-10-CM

## 2019-11-07 DIAGNOSIS — Z5111 Encounter for antineoplastic chemotherapy: Secondary | ICD-10-CM | POA: Diagnosis not present

## 2019-11-07 DIAGNOSIS — D473 Essential (hemorrhagic) thrombocythemia: Secondary | ICD-10-CM

## 2019-11-07 LAB — IRON AND TIBC
Iron: 31 ug/dL (ref 28–170)
Saturation Ratios: 17 % (ref 10.4–31.8)
TIBC: 187 ug/dL — ABNORMAL LOW (ref 250–450)
UIBC: 156 ug/dL

## 2019-11-07 LAB — CBC WITH DIFFERENTIAL/PLATELET
Abs Immature Granulocytes: 0.03 10*3/uL (ref 0.00–0.07)
Basophils Absolute: 0 10*3/uL (ref 0.0–0.1)
Basophils Relative: 1 %
Eosinophils Absolute: 0.6 10*3/uL — ABNORMAL HIGH (ref 0.0–0.5)
Eosinophils Relative: 9 %
HCT: 27.5 % — ABNORMAL LOW (ref 36.0–46.0)
Hemoglobin: 8 g/dL — ABNORMAL LOW (ref 12.0–15.0)
Immature Granulocytes: 1 %
Lymphocytes Relative: 29 %
Lymphs Abs: 2 10*3/uL (ref 0.7–4.0)
MCH: 21.2 pg — ABNORMAL LOW (ref 26.0–34.0)
MCHC: 29.1 g/dL — ABNORMAL LOW (ref 30.0–36.0)
MCV: 72.8 fL — ABNORMAL LOW (ref 80.0–100.0)
Monocytes Absolute: 0.5 10*3/uL (ref 0.1–1.0)
Monocytes Relative: 8 %
Neutro Abs: 3.5 10*3/uL (ref 1.7–7.7)
Neutrophils Relative %: 52 %
Platelets: 452 10*3/uL — ABNORMAL HIGH (ref 150–400)
RBC: 3.78 MIL/uL — ABNORMAL LOW (ref 3.87–5.11)
RDW: 20.6 % — ABNORMAL HIGH (ref 11.5–15.5)
WBC: 6.7 10*3/uL (ref 4.0–10.5)
nRBC: 0 % (ref 0.0–0.2)

## 2019-11-07 LAB — COMPREHENSIVE METABOLIC PANEL
ALT: 6 U/L (ref 0–44)
AST: 14 U/L — ABNORMAL LOW (ref 15–41)
Albumin: 3 g/dL — ABNORMAL LOW (ref 3.5–5.0)
Alkaline Phosphatase: 65 U/L (ref 38–126)
Anion gap: 9 (ref 5–15)
BUN: 14 mg/dL (ref 8–23)
CO2: 22 mmol/L (ref 22–32)
Calcium: 8.8 mg/dL — ABNORMAL LOW (ref 8.9–10.3)
Chloride: 108 mmol/L (ref 98–111)
Creatinine, Ser: 0.66 mg/dL (ref 0.44–1.00)
GFR calc Af Amer: >60 mL/min (ref >60)
GFR calc non Af Amer: >60 mL/min (ref >60)
Glucose, Bld: 95 mg/dL (ref 70–99)
Potassium: 3.2 mmol/L — ABNORMAL LOW (ref 3.5–5.1)
Sodium: 139 mmol/L (ref 135–145)
Total Bilirubin: 0.5 mg/dL (ref 0.3–1.2)
Total Protein: 8.3 g/dL — ABNORMAL HIGH (ref 6.5–8.1)

## 2019-11-07 LAB — RETIC PANEL
Immature Retic Fract: 20.8 % — ABNORMAL HIGH (ref 2.3–15.9)
RBC.: 3.77 MIL/uL — ABNORMAL LOW (ref 3.87–5.11)
Retic Count, Absolute: 52.4 10*3/uL (ref 19.0–186.0)
Retic Ct Pct: 1.4 % (ref 0.4–3.1)
Reticulocyte Hemoglobin: 22.4 pg — ABNORMAL LOW (ref >27.9)

## 2019-11-07 LAB — FERRITIN: Ferritin: 204 ng/mL (ref 11–307)

## 2019-11-07 LAB — PREPARE RBC (CROSSMATCH)

## 2019-11-07 LAB — ABO/RH: ABO/RH(D): O POS

## 2019-11-07 MED ORDER — SODIUM CHLORIDE 0.9 % IV SOLN
430.0000 mg | Freq: Once | INTRAVENOUS | Status: AC
  Administered 2019-11-07: 430 mg via INTRAVENOUS
  Filled 2019-11-07: qty 43

## 2019-11-07 MED ORDER — PEGFILGRASTIM 6 MG/0.6ML ~~LOC~~ PSKT
6.0000 mg | PREFILLED_SYRINGE | Freq: Once | SUBCUTANEOUS | Status: AC
  Administered 2019-11-07: 6 mg via SUBCUTANEOUS
  Filled 2019-11-07: qty 0.6

## 2019-11-07 MED ORDER — SODIUM CHLORIDE 0.9 % IV SOLN
Freq: Once | INTRAVENOUS | Status: AC
  Administered 2019-11-07: 11:00:00 via INTRAVENOUS
  Filled 2019-11-07: qty 5

## 2019-11-07 MED ORDER — SODIUM CHLORIDE 0.9% FLUSH
10.0000 mL | Freq: Once | INTRAVENOUS | Status: AC
  Administered 2019-11-07: 10 mL via INTRAVENOUS
  Filled 2019-11-07: qty 10

## 2019-11-07 MED ORDER — PALONOSETRON HCL INJECTION 0.25 MG/5ML
0.2500 mg | Freq: Once | INTRAVENOUS | Status: AC
  Administered 2019-11-07: 0.25 mg via INTRAVENOUS
  Filled 2019-11-07: qty 5

## 2019-11-07 MED ORDER — SODIUM CHLORIDE 0.9 % IV SOLN
Freq: Once | INTRAVENOUS | Status: AC
  Administered 2019-11-07: 10:00:00 via INTRAVENOUS
  Filled 2019-11-07: qty 250

## 2019-11-07 MED ORDER — FERROUS SULFATE 325 (65 FE) MG PO TBEC: 325.0000 mg | DELAYED_RELEASE_TABLET | Freq: Two times a day (BID) | ORAL | 1 refills | Status: DC

## 2019-11-07 MED ORDER — HEPARIN SOD (PORK) LOCK FLUSH 100 UNIT/ML IV SOLN
500.0000 [IU] | Freq: Once | INTRAVENOUS | Status: AC | PRN
  Administered 2019-11-07: 500 [IU]
  Filled 2019-11-07: qty 5

## 2019-11-07 MED ORDER — DIPHENHYDRAMINE HCL 50 MG/ML IJ SOLN
50.0000 mg | Freq: Once | INTRAMUSCULAR | Status: AC
  Administered 2019-11-07: 50 mg via INTRAVENOUS
  Filled 2019-11-07: qty 1

## 2019-11-07 MED ORDER — FAMOTIDINE IN NACL 20-0.9 MG/50ML-% IV SOLN
20.0000 mg | Freq: Once | INTRAVENOUS | Status: AC
  Administered 2019-11-07: 20 mg via INTRAVENOUS

## 2019-11-07 MED ORDER — SODIUM CHLORIDE 0.9 % IV SOLN
175.0000 mg/m2 | Freq: Once | INTRAVENOUS | Status: AC
  Administered 2019-11-07: 330 mg via INTRAVENOUS
  Filled 2019-11-07: qty 55

## 2019-11-07 NOTE — Progress Notes (Signed)
Medication reconciliation done with pre-screening for MD visit.

## 2019-11-07 NOTE — Progress Notes (Signed)
Hematology/Oncology follow up note [facility] Telephone:([phone]) [phone] [phone]   Patient Care Team: Felipa Eth, MD as PCP - General (Internal Medicine) Clent Jacks, RN as Oncology Nurse Navigator  REFERRING PROVIDER: Felipa Eth, MD  CHIEF COMPLAINTS/REASON FOR VISIT:  Evaluation of endometrial cancer HISTORY OF PRESENTING ILLNESS:   Sandra Fischer is a  68 y.o.  female with PMH listed below was seen in consultation at the request of  Felipa Eth, MD  for evaluation of endometrial cancer Patient had TH/BSO sentinel lymph node biopsy by [facility] oncology Dr. Allen Norris on 06/21/2019. Extensive medical records from [place] system review was performed by me.  Tumor size 9.3 cm, invading 99% of myometrium [3.55 out of 3.6 cm], positive right external iliac sentinel lymph nodes.  Negative washing.  No LVI, serosa is uninvolved. 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 [id number]  FIGO stage IIIC1 MSI intact HER 2 negative.   Patient was seen by Dr. BFransisca Connorson 07/06/2019 due to vaginal odor. Patient presents for establishing care and discussion for adjuvant chemotherapy radiation.  Per Dr. BBlake Divinenote, [facility].  Portex 3 regimen was recommended given subgroup analysis showing benefit in the serous histology versus clinical trial. HER2 was ordered and pending results.   Patient has a history of SVT.  10/14/2018 2D echo reviewed moderately reduced LV function with LVEF 35 to 40% with diffuse hypokinesis.  Patient had Lexiscan Myoview on 01/19/2019.  LVEF 49%.  SPECT images revealed a mild fixed inferior wall defect, scar versus artifact.  No evidence of ischemia.  She follows up with cardiology Dr.Paraschos 04/26/2019 with a plan to stay on her current medication. 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 She is married and providing care  for her mother.  # 10/20/2019 CT chest abdomen pelvis with contrast showed disease recurrence.  There are multiple newly enlarged retroperitoneal and right iliac lymph nodes the largest the left retroperitoneal node measuring 1.9 x 1.6 cm concerning for nodal metastasis disease.  Unchanged 4 mm groundglass pulmonary nodule of the left pulmonary apex.  This remains nonspecific.  INTERVAL HISTORY Sandra Knoffis a 68y.o. female who has above history reviewed by me today presents for follow up visit for management of High-grade endometrial cancer. Problems and complaints are listed below: Sinusitis, patient was given a course of 5 days of Augmentin treatments.  She took 3 days and stopped as she felt Augmentin has caused her to have vaginal yeast infection.  Was treated with Diflucan.  Sinus symptoms have no changes.  She is using saline wash. Denies any fever, chills, facial pain.  Also reports feeling tired. Her port has been flushed last week without any problems. Denies any shortness of breath, chest pain, abdominal pain today. Denies hematochezia, hematuria, hematemesis, epistaxis, black tarry stool or easy bruising.   Review of Systems  Constitutional: Negative for appetite change, chills, fatigue and fever.  HENT:   Negative for hearing loss and voice change.   Eyes: Negative for eye problems.  Respiratory: Negative for chest tightness and cough.   Cardiovascular: Negative for chest pain.  Gastrointestinal: Negative for abdominal distention, abdominal pain and blood in stool.  Endocrine: Negative for hot flashes.  Genitourinary: Negative for difficulty urinating and frequency.   Musculoskeletal: Negative for arthralgias.  Skin: Negative for itching and rash.  Neurological: Negative for extremity weakness.  Hematological: Negative for adenopathy.  Psychiatric/Behavioral: Negative for  confusion.    MEDICAL HISTORY:  Past Medical History:  Diagnosis Date   Asthma    Asthma     Asthma exacerbation 08/22/2016   Cancer ([place])    Collagen vascular disease ([place])    Diabetes mellitus without complication ([place])    History of Diabetes    Environmental allergies    Fibromyalgia    High cholesterol    Hypertension    RA (rheumatoid arthritis) ([place])    Thyroid disease     SURGICAL HISTORY: Past Surgical History:  Procedure Laterality Date   CATARACT EXTRACTION     CHOLECYSTECTOMY     fibroids removed     THYROIDECTOMY, PARTIAL     tummy tuck      SOCIAL HISTORY: Social History   Socioeconomic History   Marital status: Married    Spouse name: Not on file   Number of children: Not on file   Years of education: Not on file   Highest education level: Not on file  Occupational History   Not on file  Social Needs   Financial resource strain: Not on file   Food insecurity    Worry: Not on file    Inability: Not on file   Transportation needs    Medical: Not on file    Non-medical: Not on file  Tobacco Use   Smoking status: Never Smoker   Smokeless tobacco: Never Used  Substance and Sexual Activity   Alcohol use: No   Drug use: No   Sexual activity: Yes    Birth control/protection: Post-menopausal  Lifestyle   Physical activity    Days per week: Not on file    Minutes per session: Not on file   Stress: Not on file  Relationships   Social connections    Talks on phone: Not on file    Gets together: Not on file    Attends religious service: Not on file    Active member of club or organization: Not on file    Attends meetings of clubs or organizations: Not on file    Relationship status: Not on file   Intimate partner violence    Fear of current or ex partner: Not on file    Emotionally abused: Not on file    Physically abused: Not on file    Forced sexual activity: Not on file  Other Topics Concern   Not on file  Social History Narrative   Not on file    FAMILY HISTORY: Family History  Problem Relation  Age of Onset   Diabetes Mother    Hypertension Mother    Hyperlipidemia Mother    Dementia Mother    Breast cancer Neg Hx    Ovarian cancer Neg Hx    Colon cancer Neg Hx     ALLERGIES:  is allergic to abatacept; codeine; latex; shellfish allergy; and penicillins.  MEDICATIONS:  Current Outpatient Medications  Medication Sig Dispense Refill   albuterol (ACCUNEB) 1.25 MG/3ML nebulizer solution Take 1 ampule by nebulization every 6 (six) hours as needed for wheezing.     albuterol (PROVENTIL HFA;VENTOLIN HFA) 108 (90 Base) MCG/ACT inhaler Inhale 2 puffs into the lungs every 6 (six) hours as needed for wheezing or shortness of breath. 1 Inhaler 0   aspirin EC 81 MG tablet Take 81 mg by mouth daily.     azelastine (ASTELIN) 0.1 % nasal spray Place 2 sprays into both nostrils 2 (two) times daily. 30 mL 5   budesonide-formoterol (SYMBICORT) 160-4.5 MCG/ACT  inhaler Inhale 2 puffs into the lungs 2 (two) times daily.     calcium carbonate (OS-CAL - DOSED IN MG OF ELEMENTAL CALCIUM) 1250 (500 Ca) MG tablet Take by mouth.     CALCIUM PO Take 1 tablet by mouth daily.     cetirizine (ZYRTEC) 10 MG tablet Take 10 mg by mouth daily.     Cholecalciferol (D3 ADULT PO) Take 1 capsule by mouth daily.     desloratadine (CLARINEX) 5 MG tablet Take 5 mg by mouth daily.     dexamethasone (DECADRON) 4 MG tablet Take 2 tablets by mouth once a day starting the day after chemotherapy. Continue for 3 days total. Take with food. 30 tablet 1   docusate sodium (COLACE) 50 MG capsule Take 50 mg by mouth 2 (two) times daily.     EPINEPHrine 0.3 mg/0.3 mL IJ SOAJ injection Use as directed for severe allergic reaction. 2 Device 1   fluconazole (DIFLUCAN) 150 MG tablet Take 1 tablet (150 mg) by mouth. Three days later, take second tablet. 2 tablet 0   fluticasone (FLONASE) 50 MCG/ACT nasal spray Place into both nostrils daily.     guaiFENesin-dextromethorphan (ROBITUSSIN DM) 100-10 MG/5ML syrup Take  5 mLs by mouth every 4 (four) hours as needed for cough. 118 mL 0   ipratropium (ATROVENT) 0.02 % nebulizer solution Take 0.5 mg by nebulization 4 (four) times daily.     leflunomide (ARAVA) 20 MG tablet Take 20 mg by mouth daily.     levothyroxine (SYNTHROID, LEVOTHROID) 88 MCG tablet Take 88 mcg by mouth daily before breakfast.     lidocaine-prilocaine (EMLA) cream Apply to affected area once 30 g 3   lisinopril (PRINIVIL,ZESTRIL) 40 MG tablet Take 40 mg by mouth daily.     metoprolol succinate (TOPROL-XL) 50 MG 24 hr tablet Take 50 mg by mouth daily. Take with or immediately following a meal.     montelukast (SINGULAIR) 10 MG tablet Take 10 mg by mouth at bedtime.     olopatadine (PATANOL) 0.1 % ophthalmic solution Place 1 drop into both eyes 2 (two) times daily. 5 mL 5   omeprazole (PRILOSEC) 10 MG capsule Take 20 mg by mouth daily.     pravastatin (PRAVACHOL) 40 MG tablet Take 40 mg by mouth daily.     Prenatal Vit-Fe Fumarate-FA (MULTIVITAMIN-PRENATAL) 27-0.8 MG TABS tablet Take 1 tablet by mouth daily at 12 noon.     prochlorperazine (COMPAZINE) 10 MG tablet Take 1 tablet (10 mg total) by mouth every 6 (six) hours as needed (Nausea or vomiting). 30 tablet 1   senna-docusate (SENOKOT-S) 8.6-50 MG tablet Take by mouth.     tiotropium (SPIRIVA) 18 MCG inhalation capsule Place 18 mcg into inhaler and inhale daily.     traMADol (ULTRAM) 50 MG tablet Take 100 mg by mouth as needed (for rheumatoid arthritis).     verapamil (CALAN) 120 MG tablet Take 240 mg by mouth 2 (two) times daily.     No current facility-administered medications for this visit.    Facility-Administered Medications Ordered in Other Visits  Medication Dose Route Frequency Provider Last Rate Last Dose   CARBOplatin (PARAPLATIN) 430 mg in sodium chloride 0.9 % 250 mL chemo infusion  430 mg Intravenous Once Earlie Server, MD       heparin lock flush 100 unit/mL  500 Units Intracatheter Once PRN Earlie Server, MD        pegfilgrastim (NEULASTA ONPRO KIT) injection 6 mg  6 mg Subcutaneous Once  Earlie Server, MD         PHYSICAL EXAMINATION: ECOG PERFORMANCE STATUS: 1 - Symptomatic but completely ambulatory  Physical Exam Vitals signs and nursing note reviewed.  Constitutional:      General: She is not in acute distress. HENT:     Head: Normocephalic and atraumatic.  Eyes:     General: No scleral icterus.    Pupils: Pupils are equal, round, and reactive to light.  Neck:     Musculoskeletal: Normal range of motion and neck supple.  Cardiovascular:     Rate and Rhythm: Normal rate and regular rhythm.     Heart sounds: Normal heart sounds.  Pulmonary:     Effort: Pulmonary effort is normal. No respiratory distress.     Breath sounds: No wheezing.  Abdominal:     General: Bowel sounds are normal. There is no distension.     Palpations: Abdomen is soft. There is no mass.     Tenderness: There is no abdominal tenderness.  Musculoskeletal: Normal range of motion.        General: No deformity.  Skin:    General: Skin is warm and dry.     Findings: No erythema or rash.  Neurological:     Mental Status: She is alert and oriented to person, place, and time.     Cranial Nerves: No cranial nerve deficit.     Coordination: Coordination normal.  Psychiatric:        Behavior: Behavior normal.        Thought Content: Thought content normal.     LABORATORY DATA:  I have reviewed the data as listed Lab Results  Component Value Date   WBC 6.7 11/07/2019   HGB 8.0 (L) 11/07/2019   HCT 27.5 (L) 11/07/2019   MCV 72.8 (L) 11/07/2019   PLT 452 (H) 11/07/2019   Recent Labs    11/26/18 1746 06/08/19 1543 10/20/19 0913 11/07/19 0851  NA 141 138  --  139  K 3.7 4.3  --  3.2*  CL 107 103  --  108  CO2 26 26  --  22  GLUCOSE 160* 117*  --  95  BUN 13 15  --  14  CREATININE 0.75 0.96 0.70 0.66  CALCIUM 9.3 9.5  --  8.8*  GFRNONAA >60 >60  --  >60  GFRAA >60 >60  --  >60  PROT  --  7.9  --  8.3*   ALBUMIN  --  3.8  --  3.0*  AST  --  17  --  14*  ALT  --  12  --  6  ALKPHOS  --  65  --  65  BILITOT  --  0.5  --  0.5   Iron/TIBC/Ferritin/ %Sat    Component Value Date/Time   IRON 31 11/07/2019 0906   TIBC 187 (L) 11/07/2019 0906   FERRITIN 204 11/07/2019 0906   IRONPCTSAT 17 11/07/2019 0906      RADIOGRAPHIC STUDIES: I have personally reviewed the radiological images as listed and agreed with the findings in the report. Ct Chest W Contrast  Result Date: 10/20/2019 CLINICAL DATA:  Follow-up endometrial cancer, status post hysterectomy, no adjuvant therapy EXAM: CT CHEST, ABDOMEN, AND PELVIS WITH CONTRAST TECHNIQUE: Multidetector CT imaging of the chest, abdomen and pelvis was performed following the standard protocol during bolus administration of intravenous contrast. CONTRAST:  146m OMNIPAQUE IOHEXOL 300 MG/ML SOLN, additional oral enteric contrast COMPARISON:  CT chest abdomen pelvis, 06/13/2019, CT chest  angiogram, 10/14/2018 FINDINGS: CT CHEST FINDINGS Cardiovascular: Right chest port catheter. Aortic atherosclerosis. Normal heart size. No pericardial effusion. Mediastinum/Nodes: No enlarged mediastinal, hilar, or axillary lymph nodes. Thyroid gland, trachea, and esophagus demonstrate no significant findings. Lungs/Pleura: Unchanged 4 mm ground-glass pulmonary nodule of the left pulmonary apex (series 3, image 24). No pleural effusion or pneumothorax. Musculoskeletal: No chest wall mass or suspicious bone lesions identified. CT ABDOMEN PELVIS FINDINGS Hepatobiliary: No focal liver abnormality is seen. Status post cholecystectomy. No biliary dilatation. Pancreas: Unremarkable. No pancreatic ductal dilatation or surrounding inflammatory changes. Spleen: Normal in size without significant abnormality. Adrenals/Urinary Tract: Adrenal glands are unremarkable. Kidneys are normal, without renal calculi, solid lesion, or hydronephrosis. Bladder is unremarkable. Stomach/Bowel: Stomach is  within normal limits. Appendix appears normal. No evidence of bowel wall thickening, distention, or inflammatory changes. Descending colonic diverticula. Vascular/Lymphatic: Aortic atherosclerosis. There are multiple newly enlarged retroperitoneal and right iliac lymph nodes, the largest left retroperitoneal node measuring 1.9 x 1.6 cm (series 2, image 71). Reproductive: Status post hysterectomy. Other: No abdominal wall hernia or abnormality. No abdominopelvic ascites. Musculoskeletal: No acute or significant osseous findings. IMPRESSION: 1.  Status post interval hysterectomy. 2. There are multiple newly enlarged retroperitoneal and right iliac lymph nodes, the largest left retroperitoneal node measuring 1.9 x 1.6 cm (series 2, image 71), concerning for nodal metastatic disease. 3. Unchanged 4 mm ground-glass pulmonary nodule of the left pulmonary apex (series 3, image 24). This remains nonspecific although is again an unlikely manifestation of pulmonary metastatic disease. 4. Other chronic, incidental, and postoperative findings as above. Aortic Atherosclerosis (ICD10-I70.0). Electronically Signed   By: Eddie Candle M.D.   On: 10/20/2019 10:25   Ct Abdomen Pelvis W Contrast  Result Date: 10/20/2019 CLINICAL DATA:  Follow-up endometrial cancer, status post hysterectomy, no adjuvant therapy EXAM: CT CHEST, ABDOMEN, AND PELVIS WITH CONTRAST TECHNIQUE: Multidetector CT imaging of the chest, abdomen and pelvis was performed following the standard protocol during bolus administration of intravenous contrast. CONTRAST:  166m OMNIPAQUE IOHEXOL 300 MG/ML SOLN, additional oral enteric contrast COMPARISON:  CT chest abdomen pelvis, 06/13/2019, CT chest angiogram, 10/14/2018 FINDINGS: CT CHEST FINDINGS Cardiovascular: Right chest port catheter. Aortic atherosclerosis. Normal heart size. No pericardial effusion. Mediastinum/Nodes: No enlarged mediastinal, hilar, or axillary lymph nodes. Thyroid gland, trachea, and  esophagus demonstrate no significant findings. Lungs/Pleura: Unchanged 4 mm ground-glass pulmonary nodule of the left pulmonary apex (series 3, image 24). No pleural effusion or pneumothorax. Musculoskeletal: No chest wall mass or suspicious bone lesions identified. CT ABDOMEN PELVIS FINDINGS Hepatobiliary: No focal liver abnormality is seen. Status post cholecystectomy. No biliary dilatation. Pancreas: Unremarkable. No pancreatic ductal dilatation or surrounding inflammatory changes. Spleen: Normal in size without significant abnormality. Adrenals/Urinary Tract: Adrenal glands are unremarkable. Kidneys are normal, without renal calculi, solid lesion, or hydronephrosis. Bladder is unremarkable. Stomach/Bowel: Stomach is within normal limits. Appendix appears normal. No evidence of bowel wall thickening, distention, or inflammatory changes. Descending colonic diverticula. Vascular/Lymphatic: Aortic atherosclerosis. There are multiple newly enlarged retroperitoneal and right iliac lymph nodes, the largest left retroperitoneal node measuring 1.9 x 1.6 cm (series 2, image 71). Reproductive: Status post hysterectomy. Other: No abdominal wall hernia or abnormality. No abdominopelvic ascites. Musculoskeletal: No acute or significant osseous findings. IMPRESSION: 1.  Status post interval hysterectomy. 2. There are multiple newly enlarged retroperitoneal and right iliac lymph nodes, the largest left retroperitoneal node measuring 1.9 x 1.6 cm (series 2, image 71), concerning for nodal metastatic disease. 3. Unchanged 4 mm ground-glass pulmonary nodule of the  left pulmonary apex (series 3, image 24). This remains nonspecific although is again an unlikely manifestation of pulmonary metastatic disease. 4. Other chronic, incidental, and postoperative findings as above. Aortic Atherosclerosis (ICD10-I70.0). Electronically Signed   By: Eddie Candle M.D.   On: 10/20/2019 10:25    ASSESSMENT & PLAN:  1. Endometrial cancer ([place])    2. Microcytic anemia   3. Cardiomyopathy, unspecified type ([place])   4. Port-A-Cath in place   5. Goals of care, counseling/discussion   6. Rheumatoid arthritis, involving unspecified site, unspecified whether rheumatoid factor present ([place])   7. Thrombocytosis ([place])    #Recurrent endometrial cancer. Labs are reviewed and discussed with patient. Compared to blood work that was done in June 2020, she has had significant drop of hemoglobin from 11 to 8 today. I will add reticulocyte panel and iron, TIBC, ferritin. I had a discussion with patient and husband today. Options of holding chemotherapy today, proceed with work-up of anemia, possible IV iron infusion was discussed with patient. Patient wants to proceed with chemotherapy without any waiting. We discussed about IV iron option, rationale and potential side effects.  Patient is not interested in any IV iron infusion at this point. She is okay with proceeding with a blood transfusion this week, okay with the plan of starting oral iron supplementation twice daily.  Prescription sent to pharmacy. Type and screen, 1 unit of PRBC on 11/09/2019. Proceed with chemotherapy today carboplatin-dose reduced to AUC of 4.5, proceed with Taxol 175 mg/m. Growth factor-Neulasta Onpro kit would be given as prophylaxis for chemotherapy-induced neutropenia to prevent febrile neutropenias. Discussed potential side effect- myalgias/arthralgias- recommend Claritin for 4 days.   Repeat blood work and follow-up in the clinic in 1 week for evaluation of tolerability of chemotherapy. #Labs reviewed.  Iron panel showed ferritin of 204.  She does have any decreased reticulocyte hemoglobin level consistent with iron deficiency anemia. Elevated ferritin can be secondary to chronic inflammation from rheumatoid arthritis.  Start oral iron supplementation.  Prescription sent to pharmacy. #Cytosis, likely secondary to iron deficiency.  Continue to  monitor.Marland Kitchen #Cardiomyopathy, LVEF35 to 40% Last seen by cardiology 04/26/2019.  Recommend patient to continue follow-up with cardiology. #Rheumatoid arthritis, continue follow-up rheumatology.  Chronic joint pain.  Currently not on any rheumatology treatments.  Return of visit: 1 week for assessment before starting treatments. Earlie Server, MD, PhD Hematology Oncology [facility] Pager- [phone] 11/07/2019

## 2019-11-08 ENCOUNTER — Telehealth: Payer: Self-pay

## 2019-11-08 LAB — CA 125: Cancer Antigen (CA) 125: 13 U/mL (ref 0.0–38.1)

## 2019-11-08 NOTE — Telephone Encounter (Signed)
Telephone call to patient for follow up after first chemo infusion received yesterday.  Patient states she did great with infusion and is not having any side effects.   States coming tomorrow for blood transfusion and looking forward to getting it and hoping to feel even better.  No complaints voiced.   Encouraged patient to call for any questions or concerns.

## 2019-11-09 ENCOUNTER — Inpatient Hospital Stay: Payer: Medicare Other

## 2019-11-09 ENCOUNTER — Other Ambulatory Visit: Payer: Self-pay

## 2019-11-09 DIAGNOSIS — D649 Anemia, unspecified: Secondary | ICD-10-CM

## 2019-11-09 DIAGNOSIS — C541 Malignant neoplasm of endometrium: Secondary | ICD-10-CM

## 2019-11-09 DIAGNOSIS — Z5111 Encounter for antineoplastic chemotherapy: Secondary | ICD-10-CM | POA: Diagnosis not present

## 2019-11-09 MED ORDER — ACETAMINOPHEN 325 MG PO TABS
650.0000 mg | ORAL_TABLET | Freq: Once | ORAL | Status: AC
  Administered 2019-11-09: 09:00:00 650 mg via ORAL
  Filled 2019-11-09: qty 2

## 2019-11-09 MED ORDER — SODIUM CHLORIDE 0.9% FLUSH
10.0000 mL | INTRAVENOUS | Status: AC | PRN
  Administered 2019-11-09: 09:00:00 10 mL
  Filled 2019-11-09: qty 10

## 2019-11-09 MED ORDER — HEPARIN SOD (PORK) LOCK FLUSH 100 UNIT/ML IV SOLN
500.0000 [IU] | Freq: Every day | INTRAVENOUS | Status: AC | PRN
  Administered 2019-11-09: 500 [IU]
  Filled 2019-11-09: qty 5

## 2019-11-09 MED ORDER — DIPHENHYDRAMINE HCL 25 MG PO CAPS
25.0000 mg | ORAL_CAPSULE | Freq: Once | ORAL | Status: AC
  Administered 2019-11-09: 09:00:00 25 mg via ORAL
  Filled 2019-11-09: qty 1

## 2019-11-09 MED ORDER — SODIUM CHLORIDE 0.9% IV SOLUTION
250.0000 mL | Freq: Once | INTRAVENOUS | Status: AC
  Administered 2019-11-09: 09:00:00 250 mL via INTRAVENOUS
  Filled 2019-11-09: qty 250

## 2019-11-09 NOTE — Progress Notes (Signed)
Pt tolerated blood transinfusion well with no signs of complications or reactions. VSS throughout infusion and prior to discharge. Pt stable for discharge.   Sandra Fischer [employer]

## 2019-11-10 ENCOUNTER — Ambulatory Visit: Payer: Medicare Other

## 2019-11-10 LAB — TYPE AND SCREEN
ABO/RH(D): O POS
Antibody Screen: NEGATIVE
Unit division: 0

## 2019-11-10 LAB — BPAM RBC
Blood Product Expiration Date: 202011252359
ISSUE DATE / TIME: 202011110958
Unit Type and Rh: 5100

## 2019-11-11 ENCOUNTER — Other Ambulatory Visit: Payer: Self-pay

## 2019-11-11 ENCOUNTER — Encounter: Payer: Self-pay | Admitting: Oncology

## 2019-11-11 NOTE — Progress Notes (Signed)
Patient pre screened for office appointment, no questions or concerns today. Patient reminded of upcoming appointment time and date. 

## 2019-11-13 ENCOUNTER — Emergency Department (HOSPITAL_COMMUNITY): Payer: No Typology Code available for payment source

## 2019-11-13 ENCOUNTER — Other Ambulatory Visit: Payer: Self-pay

## 2019-11-13 ENCOUNTER — Encounter (HOSPITAL_COMMUNITY): Payer: Self-pay | Admitting: *Deleted

## 2019-11-13 ENCOUNTER — Emergency Department (HOSPITAL_COMMUNITY)
Admission: EM | Admit: 2019-11-13 | Discharge: 2019-11-13 | Disposition: A | Discharge status: Home / Self Care | Payer: No Typology Code available for payment source | Attending: Emergency Medicine | Admitting: Emergency Medicine

## 2019-11-13 DIAGNOSIS — E119 Type 2 diabetes mellitus without complications: Secondary | ICD-10-CM | POA: Insufficient documentation

## 2019-11-13 DIAGNOSIS — Z7982 Long term (current) use of aspirin: Secondary | ICD-10-CM | POA: Insufficient documentation

## 2019-11-13 DIAGNOSIS — Z91040 Latex allergy status: Secondary | ICD-10-CM | POA: Diagnosis not present

## 2019-11-13 DIAGNOSIS — J45909 Unspecified asthma, uncomplicated: Secondary | ICD-10-CM | POA: Insufficient documentation

## 2019-11-13 DIAGNOSIS — R791 Abnormal coagulation profile: Secondary | ICD-10-CM | POA: Diagnosis not present

## 2019-11-13 DIAGNOSIS — C541 Malignant neoplasm of endometrium: Secondary | ICD-10-CM | POA: Insufficient documentation

## 2019-11-13 DIAGNOSIS — R748 Abnormal levels of other serum enzymes: Secondary | ICD-10-CM | POA: Insufficient documentation

## 2019-11-13 DIAGNOSIS — N760 Acute vaginitis: Secondary | ICD-10-CM | POA: Insufficient documentation

## 2019-11-13 DIAGNOSIS — I10 Essential (primary) hypertension: Secondary | ICD-10-CM | POA: Diagnosis not present

## 2019-11-13 DIAGNOSIS — R7989 Other specified abnormal findings of blood chemistry: Secondary | ICD-10-CM

## 2019-11-13 DIAGNOSIS — R0789 Other chest pain: Secondary | ICD-10-CM | POA: Insufficient documentation

## 2019-11-13 DIAGNOSIS — B9689 Other specified bacterial agents as the cause of diseases classified elsewhere: Secondary | ICD-10-CM | POA: Insufficient documentation

## 2019-11-13 DIAGNOSIS — K2100 Gastro-esophageal reflux disease with esophagitis, without bleeding: Secondary | ICD-10-CM | POA: Insufficient documentation

## 2019-11-13 DIAGNOSIS — Z79899 Other long term (current) drug therapy: Secondary | ICD-10-CM | POA: Diagnosis not present

## 2019-11-13 DIAGNOSIS — N9089 Other specified noninflammatory disorders of vulva and perineum: Secondary | ICD-10-CM

## 2019-11-13 LAB — COMPREHENSIVE METABOLIC PANEL
ALT: 21 U/L (ref 0–44)
AST: 19 U/L (ref 15–41)
Albumin: 3 g/dL — ABNORMAL LOW (ref 3.5–5.0)
Alkaline Phosphatase: 68 U/L (ref 38–126)
Anion gap: 13 (ref 5–15)
BUN: 15 mg/dL (ref 8–23)
CO2: 23 mmol/L (ref 22–32)
Calcium: 9.1 mg/dL (ref 8.9–10.3)
Chloride: 101 mmol/L (ref 98–111)
Creatinine, Ser: 0.77 mg/dL (ref 0.44–1.00)
GFR calc Af Amer: >60 mL/min (ref >60)
GFR calc non Af Amer: >60 mL/min (ref >60)
Glucose, Bld: 93 mg/dL (ref 70–99)
Potassium: 3.2 mmol/L — ABNORMAL LOW (ref 3.5–5.1)
Sodium: 137 mmol/L (ref 135–145)
Total Bilirubin: 0.4 mg/dL (ref 0.3–1.2)
Total Protein: 7 g/dL (ref 6.5–8.1)

## 2019-11-13 LAB — WET PREP, GENITAL
Sperm: NONE SEEN
Trich, Wet Prep: NONE SEEN
Yeast Wet Prep HPF POC: NONE SEEN

## 2019-11-13 LAB — TROPONIN I (HIGH SENSITIVITY)
Troponin I (High Sensitivity): 10 ng/L (ref <18)
Troponin I (High Sensitivity): 4 ng/L (ref <18)

## 2019-11-13 LAB — LIPASE, BLOOD: Lipase: 64 U/L — ABNORMAL HIGH (ref 11–51)

## 2019-11-13 LAB — CBC
HCT: 34.2 % — ABNORMAL LOW (ref 36.0–46.0)
Hemoglobin: 10.5 g/dL — ABNORMAL LOW (ref 12.0–15.0)
MCH: 23 pg — ABNORMAL LOW (ref 26.0–34.0)
MCHC: 30.7 g/dL (ref 30.0–36.0)
MCV: 75 fL — ABNORMAL LOW (ref 80.0–100.0)
Platelets: 284 10*3/uL (ref 150–400)
RBC: 4.56 MIL/uL (ref 3.87–5.11)
RDW: 20.7 % — ABNORMAL HIGH (ref 11.5–15.5)
WBC: 4 10*3/uL (ref 4.0–10.5)
nRBC: 0 % (ref 0.0–0.2)

## 2019-11-13 LAB — MAGNESIUM: Magnesium: 1.9 mg/dL (ref 1.7–2.4)

## 2019-11-13 LAB — D-DIMER, QUANTITATIVE: D-Dimer, Quant: 2.74 ug/mL-FEU — ABNORMAL HIGH (ref 0.00–0.50)

## 2019-11-13 MED ORDER — IOHEXOL 350 MG/ML SOLN
100.0000 mL | Freq: Once | INTRAVENOUS | Status: AC | PRN
  Administered 2019-11-13: 07:00:00 61 mL via INTRAVENOUS

## 2019-11-13 MED ORDER — ALUM & MAG HYDROXIDE-SIMETH 200-200-20 MG/5ML PO SUSP
30.0000 mL | Freq: Once | ORAL | Status: AC
  Administered 2019-11-13: 06:00:00 30 mL via ORAL
  Filled 2019-11-13: qty 30

## 2019-11-13 MED ORDER — POTASSIUM CHLORIDE CRYS ER 20 MEQ PO TBCR
40.0000 meq | EXTENDED_RELEASE_TABLET | Freq: Once | ORAL | Status: AC
  Administered 2019-11-13: 40 meq via ORAL
  Filled 2019-11-13: qty 2

## 2019-11-13 MED ORDER — VALACYCLOVIR HCL 1 G PO TABS: 1000.0000 mg | ORAL_TABLET | Freq: Two times a day (BID) | ORAL | 0 refills | Status: DC

## 2019-11-13 MED ORDER — ASPIRIN 81 MG PO CHEW
162.0000 mg | CHEWABLE_TABLET | Freq: Once | ORAL | Status: AC
  Administered 2019-11-13: 06:00:00 162 mg via ORAL
  Filled 2019-11-13: qty 2

## 2019-11-13 MED ORDER — SODIUM CHLORIDE 0.9% FLUSH: 3.0000 mL | Freq: Once | INTRAVENOUS | Status: DC

## 2019-11-13 MED ORDER — LIDOCAINE VISCOUS HCL 2 % MT SOLN
15.0000 mL | Freq: Once | OROMUCOSAL | Status: AC
  Administered 2019-11-13: 06:00:00 15 mL via ORAL
  Filled 2019-11-13: qty 15

## 2019-11-13 MED ORDER — METRONIDAZOLE 500 MG PO TABS: 500.0000 mg | ORAL_TABLET | Freq: Two times a day (BID) | ORAL | 0 refills | Status: DC

## 2019-11-13 NOTE — ED Triage Notes (Addendum)
Pt ate dinner tonight, started having indigestion. Pain started in central chest and is now having lower back pain. Started chemo on Monday for endometriosis.  Also c/o vaginal itching

## 2019-11-13 NOTE — ED Notes (Signed)
Patient transported to X-ray 

## 2019-11-13 NOTE — ED Provider Notes (Signed)
[facility] Provider Note   CSN: [id number] Arrival date & time: 11/13/19  0402    History   Chief Complaint Chief Complaint  Patient presents with   Chest Pain   Vaginal Itching    HPI Sandra Fischer is a 68 y.o. female.   The history is provided by the patient.  Chest Pain Vaginal Itching Associated symptoms include chest pain.  She has history of hypertension, diabetes, hyperlipidemia, endometrial cancer and comes in because of chest pain.  She had eaten at a restaurant and developed a burning sensation in her chest which she thought was indigestion.  She took a dose of omeprazole without relief, and the discomfort radiated to her back.  She rates pain at 8/10.  There is no associated dyspnea.  There is no nausea.  There is mild diaphoresis.  Nothing made the pain better, nothing made it worse.  She is a non-smoker and should not have any alcohol with her dinner.  She did recently start chemotherapy for her endometrial cancer.  Has a separate complaint, she is having vaginal itching and discharge.  She had developed itching following the chemotherapy and was given a dose of fluconazole, but the itching is not gotten better and she has noted some purulent drainage.  Past Medical History:  Diagnosis Date   Asthma    Asthma    Asthma exacerbation 08/22/2016   Cancer ([place])    Collagen vascular disease ([place])    Diabetes mellitus without complication ([place])    History of Diabetes    Environmental allergies    Fibromyalgia    High cholesterol    Hypertension    RA (rheumatoid arthritis) ([place])    Thyroid disease     Patient Active Problem List   Diagnosis Date Noted   Anemia 11/07/2019   Port-A-Cath in place 11/04/2019   Rheumatoid arthritis ([place]) 10/29/2019   Goals of care, counseling/discussion 07/20/2019   Endometrial cancer ([place]) 07/15/2019   Fibromyalgia    Cardiomyopathy ([place]) 12/06/2018   Perennial and  seasonal allergic rhinitis 02/09/2018   Moderate persistent asthma 02/09/2018   Allergic conjunctivitis 02/09/2018   History of food allergy 02/09/2018   Allergic reaction 02/09/2018   RA (rheumatoid arthritis) ([place]) 08/22/2016   HTN (hypertension) 08/22/2016    Past Surgical History:  Procedure Laterality Date   CATARACT EXTRACTION     CHOLECYSTECTOMY     fibroids removed     THYROIDECTOMY, PARTIAL     tummy tuck       OB History    Gravida  2   Para      Term      Preterm      AB  2   Living        SAB  2   TAB      Ectopic      Multiple      Live Births               Home Medications    Prior to Admission medications   Medication Sig Start Date End Date Taking? Authorizing Provider  albuterol (ACCUNEB) 1.25 MG/3ML nebulizer solution Take 1 ampule by nebulization every 6 (six) hours as needed for wheezing.    [provider]  albuterol (PROVENTIL HFA;VENTOLIN HFA) 108 (90 Base) MCG/ACT inhaler Inhale 2 puffs into the lungs every 6 (six) hours as needed for wheezing or shortness of breath. 08/22/16   Nance Pear, MD  aspirin EC 81 MG tablet  Take 81 mg by mouth daily.    [provider]  azelastine (ASTELIN) 0.1 % nasal spray Place 2 sprays into both nostrils 2 (two) times daily. 02/09/18   Bobbitt, Sedalia Muta, MD  budesonide-formoterol [facility]) 160-4.5 MCG/ACT inhaler Inhale 2 puffs into the lungs 2 (two) times daily.    [provider]  calcium carbonate (OS-CAL - DOSED IN MG OF ELEMENTAL CALCIUM) 1250 (500 Ca) MG tablet Take by mouth.    [provider]  CALCIUM PO Take 1 tablet by mouth daily.    [provider]  cetirizine (ZYRTEC) 10 MG tablet Take 10 mg by mouth daily.    [provider]  Cholecalciferol (D3 ADULT PO) Take 1 capsule by mouth daily.    [provider]  desloratadine (CLARINEX) 5 MG tablet Take 5 mg by mouth daily.    [provider]   dexamethasone (DECADRON) 4 MG tablet Take 2 tablets by mouth once a day starting the day after chemotherapy. Continue for 3 days total. Take with food. 10/27/19   Earlie Server, MD  docusate sodium (COLACE) 50 MG capsule Take 50 mg by mouth 2 (two) times daily.    [provider]  EPINEPHrine 0.3 mg/0.3 mL IJ SOAJ injection Use as directed for severe allergic reaction. 05/12/18   Bobbitt, Sedalia Muta, MD  ferrous sulfate 325 (65 FE) MG EC tablet Take 1 tablet (325 mg total) by mouth 2 (two) times daily with a meal. 11/07/19   Earlie Server, MD  fluconazole (DIFLUCAN) 150 MG tablet Take 1 tablet (150 mg) by mouth. Three days later, take second tablet. 07/27/19   Verlon Au, NP  fluticasone (FLONASE) 50 MCG/ACT nasal spray Place into both nostrils daily.    [provider]  guaiFENesin-dextromethorphan (ROBITUSSIN DM) 100-10 MG/5ML syrup Take 5 mLs by mouth every 4 (four) hours as needed for cough. 08/23/16   Dustin Flock, MD  ipratropium (ATROVENT) 0.02 % nebulizer solution Take 0.5 mg by nebulization 4 (four) times daily.    [provider]  leflunomide (ARAVA) 20 MG tablet Take 20 mg by mouth daily.    [provider]  levothyroxine (SYNTHROID, LEVOTHROID) 88 MCG tablet Take 88 mcg by mouth daily before breakfast.    [provider]  lidocaine-prilocaine (EMLA) cream Apply to affected area once 10/27/19   Earlie Server, MD  lisinopril (PRINIVIL,ZESTRIL) 40 MG tablet Take 40 mg by mouth daily.    [provider]  metoprolol succinate (TOPROL-XL) 50 MG 24 hr tablet Take 50 mg by mouth daily. Take with or immediately following a meal.    [provider]  montelukast (SINGULAIR) 10 MG tablet Take 10 mg by mouth at bedtime.    [provider]  olopatadine (PATANOL) 0.1 % ophthalmic solution Place 1 drop into both eyes 2 (two) times daily. 02/09/18   Bobbitt, Sedalia Muta, MD  omeprazole (PRILOSEC) 10 MG capsule Take 20 mg by mouth daily.     [provider]  pravastatin (PRAVACHOL) 40 MG tablet Take 40 mg by mouth daily.    [provider]  Prenatal Vit-Fe Fumarate-FA (MULTIVITAMIN-PRENATAL) 27-0.8 MG TABS tablet Take 1 tablet by mouth daily at 12 noon.    [provider]  prochlorperazine (COMPAZINE) 10 MG tablet Take 1 tablet (10 mg total) by mouth every 6 (six) hours as needed (Nausea or vomiting). 10/27/19   Earlie Server, MD  senna-docusate (SENOKOT-S) 8.6-50 MG tablet Take by mouth. 06/24/19   [provider]  tiotropium (SPIRIVA) 18 MCG inhalation capsule Place 18 mcg into inhaler and inhale daily.    [provider]  traMADol (ULTRAM) 50 MG tablet Take 100 mg by mouth as needed (for rheumatoid arthritis).    [provider]  verapamil (CALAN) 120 MG tablet Take 240 mg by mouth 2 (two) times daily.    [provider]    Family History Family History  Problem Relation Age of Onset   Diabetes Mother    Hypertension Mother    Hyperlipidemia Mother    Dementia Mother    Breast cancer Neg Hx    Ovarian cancer Neg Hx    Colon cancer Neg Hx     Social History Social History   Tobacco Use   Smoking status: Never Smoker   Smokeless tobacco: Never Used  Substance Use Topics   Alcohol use: No   Drug use: No     Allergies   Abatacept, Codeine, Latex, Shellfish allergy, and Penicillins   Review of Systems Review of Systems  Cardiovascular: Positive for chest pain.  All other systems reviewed and are negative.    Physical Exam Updated Vital Signs BP (!) 159/122 (BP Location: Right Arm)    Pulse 83    Temp 98.9 F (37.2 C) (Oral)    Resp (!) 22    Ht 5\' 2"  (1.575 m)    Wt 79.4 kg    SpO2 99%    BMI 32.01 kg/m   Physical Exam Vitals signs and nursing note reviewed.    68 year old female, resting comfortably and in no acute distress. Vital signs are significant for elevated blood pressure and borderline elevated respiratory rate. Oxygen  saturation is 99%, which is normal. Head is normocephalic and atraumatic. PERRLA, EOMI. Oropharynx is clear. Neck is nontender and supple without adenopathy or JVD. Back is nontender and there is no CVA tenderness. Lungs are clear without rales, wheezes, or rhonchi. Chest is nontender. Heart has regular rate and rhythm without murmur. Abdomen is soft, flat, nontender without masses or hepatosplenomegaly and peristalsis is normoactive. Pelvic: Ulcers noted on the labia majora.  No definite discharge noted. Extremities have no cyanosis or edema, full range of motion is present. Skin is warm and dry without rash. Neurologic: Mental status is normal, cranial nerves are intact, there are no motor or sensory deficits.  ED Treatments / Results  Labs (all labs ordered are listed, but only abnormal results are displayed) Labs Reviewed  BASIC METABOLIC PANEL  CBC  TROPONIN I (HIGH SENSITIVITY)    EKG ERDENE, HANNASCH [id number] 13-Nov-2019 04:14:15 [facility]-NLD ROUTINE RECORD Sinus rhythm Low voltage, extremity and precordial leads Nonspecific T abnormalities, lateral leads Prolonged QT interval Baseline wander in lead(s) II III aVR aVF When compared with ECG of 11/26/2018, QT has lengthened Confirmed by Delora Fuel ([id number]) on 11/13/2019 5:06:33 AM 48mm/s 44mm/mV 100Hz  9.0.4 CID: [id number] Confirmed By: Delora Fuel Vent. rate 73 BPM PR interval * ms QRS duration 77 ms QT/QTc 497/548 ms P-R-T axes 69 78 63 07-07-1951 (67 yr) Female Black Room:ED001 Loc:0 Technician: [phone] Test [id number] pain, unspec 786.50  Radiology No results found.  Procedures Procedures (including critical care time)  Medications Ordered in ED Medications  sodium chloride flush (NS) 0.9 % injection 3 mL (has no administration in time range)     Initial Impression / Assessment and Plan / ED Course  I have reviewed the triage vital signs and the nursing notes.  Pertinent labs & imaging  results  that were available during my care of the patient were reviewed by me and considered in my medical decision making (see chart for details).  Atypical chest pain which is most likely gastrointestinal in origin.  Old records are reviewed, and she did have CT scans of chest and abdomen and pelvis on October 22 and there is no abdominal aneurysm.  She does have significant risk factors for coronary artery disease, but ECG shows no acute changes.  QT interval is noted to be prolonged and will check magnesium.  We will also check a high-sensitivity troponin.  Because of cancer, she is at elevated risk for pulmonary embolism so will screen with D-dimer.  She will be given a GI cocktail.  Her chemotherapy had been given on November 9 and consisted of carboplatin and paclitaxel.  Chest discomfort is much better after GI cocktail.  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  She is given prescriptions for metronidazole and valacyclovir.  D-dimer has come back elevated, she is being sent for CT angiogram of the chest to rule out pulmonary embolism.  Magnesium level is normal.  Lipase is mildly elevated, not felt to be clinically significant.  Potassium has come back slightly low and she is given a dose of oral potassium.    CT angiogram is pending.  Case is signed out to Dr. Roslynn Amble.  Final Clinical Impressions(s) / ED Diagnoses   Final diagnoses:  Gastroesophageal reflux disease with esophagitis without hemorrhage  Elevated d-dimer  Bacterial vaginosis  Labial irritation  Elevated lipase    ED Discharge Orders         Ordered    valACYclovir (VALTREX) 1000 MG tablet  2 times daily     11/13/19 0725    metroNIDAZOLE (FLAGYL) 500 MG tablet  2 times daily     11/13/19 [id number]           Delora Fuel, MD [id number] [phone]

## 2019-11-13 NOTE — ED Provider Notes (Signed)
68 year old lady with past medical history of endometrial cancer presents to ER with chest pain, back pain, vaginal itching.  Exam concerning for possible herpes. Clue cells noted on wet prep.  Dimer elevated. Plan to follow up on CTA chest, repeat trop. Plan to dc with flagyl and acyclovir   7:20 AM received signout from Dr. Roxanne Mins pending CTA chest  9:34 AM updated patient, CTA chest negative, repeat trop wnl, no delta trop, patient is asymptomatic, will dc home with husband   Lucrezia Starch, MD 11/13/19 [phone]

## 2019-11-13 NOTE — ED Notes (Signed)
Patient transported to CT 

## 2019-11-13 NOTE — Discharge Instructions (Signed)
If you have recurrence of the heartburn, take an antacid. You may also take famotidine (Pepcid AC) for a few days to get better acid suppression than with your omeprazole (there is no benefit to taking more than two omeprazole tablets a day).

## 2019-11-14 ENCOUNTER — Inpatient Hospital Stay: Payer: Medicare Other

## 2019-11-14 ENCOUNTER — Other Ambulatory Visit: Payer: Self-pay

## 2019-11-14 ENCOUNTER — Inpatient Hospital Stay (HOSPITAL_BASED_OUTPATIENT_CLINIC_OR_DEPARTMENT_OTHER): Payer: Medicare Other | Admitting: Oncology

## 2019-11-14 VITALS — BP 164/87 | HR 86 | Temp 97.9°F | Resp 18 | Wt 175.9 lb

## 2019-11-14 DIAGNOSIS — K219 Gastro-esophageal reflux disease without esophagitis: Secondary | ICD-10-CM | POA: Diagnosis not present

## 2019-11-14 DIAGNOSIS — D509 Iron deficiency anemia, unspecified: Secondary | ICD-10-CM

## 2019-11-14 DIAGNOSIS — I429 Cardiomyopathy, unspecified: Secondary | ICD-10-CM

## 2019-11-14 DIAGNOSIS — A6004 Herpesviral vulvovaginitis: Secondary | ICD-10-CM

## 2019-11-14 DIAGNOSIS — Z5111 Encounter for antineoplastic chemotherapy: Secondary | ICD-10-CM | POA: Diagnosis not present

## 2019-11-14 DIAGNOSIS — C541 Malignant neoplasm of endometrium: Secondary | ICD-10-CM

## 2019-11-14 LAB — CBC WITH DIFFERENTIAL/PLATELET
Abs Immature Granulocytes: 1.8 10*3/uL — ABNORMAL HIGH (ref 0.00–0.07)
Band Neutrophils: 18 %
Basophils Absolute: 0.1 10*3/uL (ref 0.0–0.1)
Basophils Relative: 1 %
Blasts: 1 %
Eosinophils Absolute: 0 10*3/uL (ref 0.0–0.5)
Eosinophils Relative: 0 %
HCT: 31.9 % — ABNORMAL LOW (ref 36.0–46.0)
Hemoglobin: 9.9 g/dL — ABNORMAL LOW (ref 12.0–15.0)
Lymphocytes Relative: 19 %
Lymphs Abs: 2.6 10*3/uL (ref 0.7–4.0)
MCH: 22.7 pg — ABNORMAL LOW (ref 26.0–34.0)
MCHC: 31 g/dL (ref 30.0–36.0)
MCV: 73.2 fL — ABNORMAL LOW (ref 80.0–100.0)
Metamyelocytes Relative: 5 %
Monocytes Absolute: 1.9 10*3/uL — ABNORMAL HIGH (ref 0.1–1.0)
Monocytes Relative: 14 %
Myelocytes: 7 %
Neutro Abs: 7.1 10*3/uL (ref 1.7–7.7)
Neutrophils Relative %: 34 %
Platelets: 195 10*3/uL (ref 150–400)
Promyelocytes Relative: 1 %
RBC: 4.36 MIL/uL (ref 3.87–5.11)
RDW: 21.3 % — ABNORMAL HIGH (ref 11.5–15.5)
Smear Review: ADEQUATE
WBC Morphology: ABNORMAL
WBC: 13.6 10*3/uL — ABNORMAL HIGH (ref 4.0–10.5)
nRBC: 0 % (ref 0.0–0.2)

## 2019-11-14 LAB — COMPREHENSIVE METABOLIC PANEL
ALT: 17 U/L (ref 0–44)
AST: 20 U/L (ref 15–41)
Albumin: 3.4 g/dL — ABNORMAL LOW (ref 3.5–5.0)
Alkaline Phosphatase: 74 U/L (ref 38–126)
Anion gap: 8 (ref 5–15)
BUN: 16 mg/dL (ref 8–23)
CO2: 24 mmol/L (ref 22–32)
Calcium: 8.7 mg/dL — ABNORMAL LOW (ref 8.9–10.3)
Chloride: 101 mmol/L (ref 98–111)
Creatinine, Ser: 0.61 mg/dL (ref 0.44–1.00)
GFR calc Af Amer: >60 mL/min (ref >60)
GFR calc non Af Amer: >60 mL/min (ref >60)
Glucose, Bld: 107 mg/dL — ABNORMAL HIGH (ref 70–99)
Potassium: 3.4 mmol/L — ABNORMAL LOW (ref 3.5–5.1)
Sodium: 133 mmol/L — ABNORMAL LOW (ref 135–145)
Total Bilirubin: 0.8 mg/dL (ref 0.3–1.2)
Total Protein: 7.2 g/dL (ref 6.5–8.1)

## 2019-11-14 LAB — ABO/RH: ABO/RH(D): O POS

## 2019-11-14 NOTE — Progress Notes (Signed)
Patient here for follow up. Patient went to hospital on Saturday due to back and chest pain, but it ended up being indigestion. While she was there she told doctor about itching in vaginal area and he prescribed her flagyl for vaginal vaginosis.

## 2019-11-14 NOTE — Progress Notes (Signed)
Hematology/Oncology follow up note [facility] Telephone:([phone] Fax:(336) [phone]   Patient Care Team: Felipa Eth, MD as PCP - General (Internal Medicine) Clent Jacks, RN as Oncology Nurse Navigator  REFERRING PROVIDER: Felipa Eth, MD  CHIEF COMPLAINTS/REASON FOR VISIT:  Evaluation of endometrial cancer HISTORY OF PRESENTING ILLNESS:   Sandra Fischer is a  68 y.o.  female with PMH listed below was seen in consultation at the request of  Felipa Eth, MD  for evaluation of endometrial cancer Patient had TH/BSO sentinel lymph node biopsy by [facility] oncology Dr. Allen Norris on 06/21/2019. Extensive medical records from [place] system review was performed by me.  Tumor size 9.3 cm, invading 99% of myometrium [3.55 out of 3.6 cm], positive right external iliac sentinel lymph nodes.  Negative washing.  No LVI, serosa is uninvolved. 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 pT1bpN45m  FIGO stage IIIC1 MSI intact HER 2 negative.   Patient was seen by Dr. BFransisca Connorson 07/06/2019 due to vaginal odor. Patient presents for establishing care and discussion for adjuvant chemotherapy radiation.  Per Dr. BBlake Divinenote, [facility].  Portex 3 regimen was recommended given subgroup analysis showing benefit in the serous histology versus clinical trial. HER2 was ordered and pending results.   Patient has a history of SVT.  10/14/2018 2D echo reviewed moderately reduced LV function with LVEF 35 to 40% with diffuse hypokinesis.  Patient had Lexiscan Myoview on 01/19/2019.  LVEF 49%.  SPECT images revealed a mild fixed inferior wall defect, scar versus artifact.  No evidence of ischemia.  She follows up with cardiology Dr.Paraschos 04/26/2019 with a plan to stay on her current medication. 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 She is married and providing care  for her mother.  # 10/20/2019 CT chest abdomen pelvis with contrast showed disease recurrence.  There are multiple newly enlarged retroperitoneal and right iliac lymph nodes the largest the left retroperitoneal node measuring 1.9 x 1.6 cm concerning for nodal metastasis disease.  Unchanged 4 mm groundglass pulmonary nodule of the left pulmonary apex.  This remains nonspecific. #Cardiomyopathy, LVEF35 to 40% Last seen by cardiology 04/26/2019.  Recommend patient to continue follow-up with cardiology.  INTERVAL HISTORY BTirsa Fischer a 68y.o. female who has above history reviewed by me today presents for follow up visit for management of High-grade endometrial cancer. Problems and complaints are listed below: Patient had ER visit on 11/13/2019 due to chest pain which started after he ate at a restaurant and developed burning sensation in her chest.  No associated dyspnea. Patient had a CTA which showed no pulmonary embolism.  Patient symptoms got better after GI cocktail.  Was considered to be secondary to acid reflux.  Patient takes omeprazole 20 mg daily.  She also complained of vaginal itching.  Pelvic examination showed herpes simplex lesions.  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  Patient was given prescription of Flagyl and valacyclovir.  Today patient reports feeling good.  She noted improvement of her rheumatoid arthritis symptoms.  Less joint pain Denies any nausea, vomiting, shortness of breath, chest pain, abdominal pain today.    Review of Systems  Constitutional: Negative for appetite change, chills, fatigue and fever.  HENT:   Negative for hearing loss and voice change.   Eyes: Negative for eye problems.  Respiratory: Negative for chest tightness and cough.   Cardiovascular: Negative for  chest pain.  Gastrointestinal: Negative for abdominal distention, abdominal pain and blood in stool.       Acid reflux  Endocrine: Negative for hot flashes.   Genitourinary: Negative for difficulty urinating and frequency.        Vaginal itching  Musculoskeletal: Negative for arthralgias.  Skin: Negative for itching and rash.  Neurological: Negative for extremity weakness.  Hematological: Negative for adenopathy.  Psychiatric/Behavioral: Negative for confusion.    MEDICAL HISTORY:  Past Medical History:  Diagnosis Date   Asthma    Asthma    Asthma exacerbation 08/22/2016   Cancer ([place])    Collagen vascular disease ([place])    Diabetes mellitus without complication ([place])    History of Diabetes    Environmental allergies    Fibromyalgia    High cholesterol    Hypertension    RA (rheumatoid arthritis) ([place])    Thyroid disease     SURGICAL HISTORY: Past Surgical History:  Procedure Laterality Date   CATARACT EXTRACTION     CHOLECYSTECTOMY     fibroids removed     THYROIDECTOMY, PARTIAL     tummy tuck      SOCIAL HISTORY: Social History   Socioeconomic History   Marital status: Married    Spouse name: Not on file   Number of children: Not on file   Years of education: Not on file   Highest education level: Not on file  Occupational History   Not on file  Social Needs   Financial resource strain: Not on file   Food insecurity    Worry: Not on file    Inability: Not on file   Transportation needs    Medical: Not on file    Non-medical: Not on file  Tobacco Use   Smoking status: Never Smoker   Smokeless tobacco: Never Used  Substance and Sexual Activity   Alcohol use: No   Drug use: No   Sexual activity: Yes    Birth control/protection: Post-menopausal  Lifestyle   Physical activity    Days per week: Not on file    Minutes per session: Not on file   Stress: Not on file  Relationships   Social connections    Talks on phone: Not on file    Gets together: Not on file    Attends religious service: Not on file    Active member of club or organization: Not on file    Attends  meetings of clubs or organizations: Not on file    Relationship status: Not on file   Intimate partner violence    Fear of current or ex partner: Not on file    Emotionally abused: Not on file    Physically abused: Not on file    Forced sexual activity: Not on file  Other Topics Concern   Not on file  Social History Narrative   Not on file    FAMILY HISTORY: Family History  Problem Relation Age of Onset   Diabetes Mother    Hypertension Mother    Hyperlipidemia Mother    Dementia Mother    Breast cancer Neg Hx    Ovarian cancer Neg Hx    Colon cancer Neg Hx     ALLERGIES:  is allergic to abatacept; codeine; latex; shellfish allergy; and penicillins.  MEDICATIONS:  Current Outpatient Medications  Medication Sig Dispense Refill   albuterol (ACCUNEB) 1.25 MG/3ML nebulizer solution Take 1 ampule by nebulization every 6 (six) hours as needed for wheezing.  albuterol (PROVENTIL HFA;VENTOLIN HFA) 108 (90 Base) MCG/ACT inhaler Inhale 2 puffs into the lungs every 6 (six) hours as needed for wheezing or shortness of breath. 1 Inhaler 0   aspirin EC 81 MG tablet Take 81 mg by mouth daily.     azelastine (ASTELIN) 0.1 % nasal spray Place 2 sprays into both nostrils 2 (two) times daily. 30 mL 5   budesonide-formoterol (SYMBICORT) 160-4.5 MCG/ACT inhaler Inhale 2 puffs into the lungs 2 (two) times daily.     calcium carbonate (OS-CAL - DOSED IN MG OF ELEMENTAL CALCIUM) 1250 (500 Ca) MG tablet Take 1 tablet by mouth daily with breakfast.      CALCIUM PO Take 1 tablet by mouth daily.     cetirizine (ZYRTEC) 10 MG tablet Take 10 mg by mouth daily.     Cholecalciferol (D3 ADULT PO) Take 1 capsule by mouth daily.     desloratadine (CLARINEX) 5 MG tablet Take 5 mg by mouth daily.     dexamethasone (DECADRON) 4 MG tablet Take 2 tablets by mouth once a day starting the day after chemotherapy. Continue for 3 days total. Take with food. 30 tablet 1   docusate sodium (COLACE)  50 MG capsule Take 50 mg by mouth 2 (two) times daily.     ferrous sulfate 325 (65 FE) MG EC tablet Take 1 tablet (325 mg total) by mouth 2 (two) times daily with a meal. 60 tablet 1   fluticasone (FLONASE) 50 MCG/ACT nasal spray Place 1 spray into both nostrils daily.      guaiFENesin-dextromethorphan (ROBITUSSIN DM) 100-10 MG/5ML syrup Take 5 mLs by mouth every 4 (four) hours as needed for cough. 118 mL 0   ipratropium (ATROVENT) 0.02 % nebulizer solution Take 0.5 mg by nebulization 4 (four) times daily.     leflunomide (ARAVA) 20 MG tablet Take 20 mg by mouth daily.     levothyroxine (SYNTHROID, LEVOTHROID) 88 MCG tablet Take 88 mcg by mouth daily before breakfast.     lidocaine-prilocaine (EMLA) cream Apply to affected area once (Patient taking differently: Apply 1 application topically as needed (to port). ) 30 g 3   lisinopril (PRINIVIL,ZESTRIL) 40 MG tablet Take 40 mg by mouth daily.     metoprolol succinate (TOPROL-XL) 50 MG 24 hr tablet Take 50 mg by mouth daily. Take with or immediately following a meal.     metroNIDAZOLE (FLAGYL) 500 MG tablet Take 1 tablet (500 mg total) by mouth 2 (two) times daily. 14 tablet 0   montelukast (SINGULAIR) 10 MG tablet Take 10 mg by mouth at bedtime.     olopatadine (PATANOL) 0.1 % ophthalmic solution Place 1 drop into both eyes 2 (two) times daily. 5 mL 5   omeprazole (PRILOSEC) 10 MG capsule Take 20 mg by mouth daily.     pravastatin (PRAVACHOL) 40 MG tablet Take 40 mg by mouth daily.     Prenatal Vit-Fe Fumarate-FA (MULTIVITAMIN-PRENATAL) 27-0.8 MG TABS tablet Take 1 tablet by mouth daily.      prochlorperazine (COMPAZINE) 10 MG tablet Take 1 tablet (10 mg total) by mouth every 6 (six) hours as needed (Nausea or vomiting). 30 tablet 1   senna-docusate (SENOKOT-S) 8.6-50 MG tablet Take 1 tablet by mouth at bedtime as needed for mild constipation.      tiotropium (SPIRIVA) 18 MCG inhalation capsule Place 18 mcg into inhaler and inhale  daily.     tiZANidine (ZANAFLEX) 2 MG tablet Take by mouth.     traMADol (ULTRAM) 50  MG tablet Take 100 mg by mouth as needed (for rheumatoid arthritis).     valACYclovir (VALTREX) 1000 MG tablet Take 1 tablet (1,000 mg total) by mouth 2 (two) times daily. 20 tablet 0   verapamil (CALAN) 120 MG tablet Take 240 mg by mouth 2 (two) times daily.     EPINEPHrine 0.3 mg/0.3 mL IJ SOAJ injection Use as directed for severe allergic reaction. (Patient not taking: Reported on 11/14/2019) 2 Device 1   No current facility-administered medications for this visit.      PHYSICAL EXAMINATION: ECOG PERFORMANCE STATUS: 1 - Symptomatic but completely ambulatory  Physical Exam Vitals signs and nursing note reviewed.  Constitutional:      General: She is not in acute distress. HENT:     Head: Normocephalic and atraumatic.  Eyes:     General: No scleral icterus.    Pupils: Pupils are equal, round, and reactive to light.  Neck:     Musculoskeletal: Normal range of motion and neck supple.  Cardiovascular:     Rate and Rhythm: Normal rate and regular rhythm.     Heart sounds: Normal heart sounds.  Pulmonary:     Effort: Pulmonary effort is normal. No respiratory distress.     Breath sounds: No wheezing.  Abdominal:     General: Bowel sounds are normal. There is no distension.     Palpations: Abdomen is soft. There is no mass.     Tenderness: There is no abdominal tenderness.  Musculoskeletal: Normal range of motion.        General: No deformity.  Skin:    General: Skin is warm and dry.     Findings: No erythema or rash.  Neurological:     Mental Status: She is alert and oriented to person, place, and time.     Cranial Nerves: No cranial nerve deficit.     Coordination: Coordination normal.  Psychiatric:        Behavior: Behavior normal.        Thought Content: Thought content normal.     LABORATORY DATA:  I have reviewed the data as listed Lab Results  Component Value Date   WBC  13.6 (H) 11/14/2019   HGB 9.9 (L) 11/14/2019   HCT 31.9 (L) 11/14/2019   MCV 73.2 (L) 11/14/2019   PLT 195 11/14/2019   Recent Labs    11/07/19 0851 11/13/19 0444 11/14/19 0943  NA 139 137 133*  K 3.2* 3.2* 3.4*  CL 108 101 101  CO2 _0 GLUCOSE 95 93 107*  BUN _1 CREATININE 0.66 0.77 0.61  CALCIUM 8.8* 9.1 8.7*  GFRNONAA >60 >60 >60  GFRAA >60 >60 >60  PROT 8.3* 7.0 7.2  ALBUMIN 3.0* 3.0* 3.4*  AST 14* 19 20  ALT _2 ALKPHOS 65 68 74  BILITOT 0.5 0.4 0.8   Iron/TIBC/Ferritin/ %Sat    Component Value Date/Time   IRON 31 11/07/2019 0906   TIBC 187 (L) 11/07/2019 0906   FERRITIN 204 11/07/2019 0906   IRONPCTSAT 17 11/07/2019 0906      RADIOGRAPHIC STUDIES: I have personally reviewed the radiological images as listed and agreed with the findings in the report. Dg Chest 2 View  Result Date: 11/13/2019 CLINICAL DATA:  Chest pain EXAM: CHEST - 2 VIEW COMPARISON:  11/26/2018 FINDINGS: The heart size and mediastinal contours are within normal limits. Both lungs are clear. The visualized skeletal structures are unremarkable. There is a right chest wall power-injectable  Port-A-Cath with tip at the cavoatrial junction via a right internal jugular vein approach. IMPRESSION: No active cardiopulmonary disease. Electronically Signed   By: Ulyses Jarred M.D.   On: 11/13/2019 05:28   Ct Chest W Contrast  Result Date: 10/20/2019 CLINICAL DATA:  Follow-up endometrial cancer, status post hysterectomy, no adjuvant therapy EXAM: CT CHEST, ABDOMEN, AND PELVIS WITH CONTRAST TECHNIQUE: Multidetector CT imaging of the chest, abdomen and pelvis was performed following the standard protocol during bolus administration of intravenous contrast. CONTRAST:  158m OMNIPAQUE IOHEXOL 300 MG/ML SOLN, additional oral enteric contrast COMPARISON:  CT chest abdomen pelvis, 06/13/2019, CT chest angiogram, 10/14/2018 FINDINGS: CT CHEST FINDINGS Cardiovascular: Right chest port catheter.  Aortic atherosclerosis. Normal heart size. No pericardial effusion. Mediastinum/Nodes: No enlarged mediastinal, hilar, or axillary lymph nodes. Thyroid gland, trachea, and esophagus demonstrate no significant findings. Lungs/Pleura: Unchanged 4 mm ground-glass pulmonary nodule of the left pulmonary apex (series 3, image 24). No pleural effusion or pneumothorax. Musculoskeletal: No chest wall mass or suspicious bone lesions identified. CT ABDOMEN PELVIS FINDINGS Hepatobiliary: No focal liver abnormality is seen. Status post cholecystectomy. No biliary dilatation. Pancreas: Unremarkable. No pancreatic ductal dilatation or surrounding inflammatory changes. Spleen: Normal in size without significant abnormality. Adrenals/Urinary Tract: Adrenal glands are unremarkable. Kidneys are normal, without renal calculi, solid lesion, or hydronephrosis. Bladder is unremarkable. Stomach/Bowel: Stomach is within normal limits. Appendix appears normal. No evidence of bowel wall thickening, distention, or inflammatory changes. Descending colonic diverticula. Vascular/Lymphatic: Aortic atherosclerosis. There are multiple newly enlarged retroperitoneal and right iliac lymph nodes, the largest left retroperitoneal node measuring 1.9 x 1.6 cm (series 2, image 71). Reproductive: Status post hysterectomy. Other: No abdominal wall hernia or abnormality. No abdominopelvic ascites. Musculoskeletal: No acute or significant osseous findings. IMPRESSION: 1.  Status post interval hysterectomy. 2. There are multiple newly enlarged retroperitoneal and right iliac lymph nodes, the largest left retroperitoneal node measuring 1.9 x 1.6 cm (series 2, image 71), concerning for nodal metastatic disease. 3. Unchanged 4 mm ground-glass pulmonary nodule of the left pulmonary apex (series 3, image 24). This remains nonspecific although is again an unlikely manifestation of pulmonary metastatic disease. 4. Other chronic, incidental, and postoperative findings  as above. Aortic Atherosclerosis (ICD10-I70.0). Electronically Signed   By: AEddie CandleM.D.   On: 10/20/2019 10:25   Ct Angio Chest Pe W And/or Wo Contrast  Result Date: 11/13/2019 CLINICAL DATA:  Positive D-dimer. History of endometrial cancer. Evaluate for pulmonary embolism. EXAM: CT ANGIOGRAPHY CHEST WITH CONTRAST TECHNIQUE: Multidetector CT imaging of the chest was performed using the standard protocol during bolus administration of intravenous contrast. Multiplanar CT image reconstructions and MIPs were obtained to evaluate the vascular anatomy. CONTRAST:  61 mL OMNIPAQUE IOHEXOL 350 MG/ML SOLN COMPARISON:  Chest CT-10/14/2018; CT chest, abdomen and pelvis-10/20/2019; 06/13/2019 FINDINGS: Vascular Findings: There is adequate opacification of the pulmonary arterial system with the main pulmonary artery measuring 429 Hounsfield units. There are no discrete filling defects within the pulmonary arterial tree to suggest pulmonary embolism. Normal caliber the main pulmonary artery. Cardiomegaly. Calcifications about the aortic root. No pericardial effusion though a small amount of fluid is seen within the pericardial recess. No evidence of thoracic aortic aneurysm or dissection on this nongated examination. Bovine configuration of the aortic arch. The branch vessels of the aortic arch appear widely patent throughout their imaged courses. Right jugular approach port a catheter tip terminates within the superior cavoatrial junction. Review of the MIP images confirms the above findings. ---------------------------------------------------------------------------------- Nonvascular Findings: Mediastinum/Lymph Nodes: No  bulky mediastinal, hilar axillary lymphadenopathy. Lungs/Pleura: There is a minimal amount of dependent subpleural ground-glass atelectasis. No discrete focal airspace opacities. No pleural effusion or pneumothorax. The central pulmonary airways appear patent. No new pulmonary nodules. Punctate  (approximately 4 mm) nodule within the left lung apex (image 23, series 6) is unchanged compared to the 06/13/2019 examination, not definitely seen on the 09/2018 exam. Upper abdomen: Limited evaluation of the upper abdomen demonstrates mild nodularity hepatic contour as could be seen in the setting of early cirrhotic change. No acute or aggressive osseous abnormalities. Moderate DDD of C5-C6 with disc space height loss, endplate irregularity and sclerosis. Musculoskeletal: No acute or aggressive osseous abnormalities. Moderate DDD of C5-C6 with disc space height loss, endplate irregularity and sclerosis. Regional soft tissues appear normal. IMPRESSION: 1. No acute cardiopulmonary disease. Specifically, no evidence of pulmonary embolism. 2. Indeterminate punctate (approximately 4 mm) left apical pulmonary nodule is unchanged compared to the 05/2019 examination of uncertain though doubtful clinical concern. Electronically Signed   By: Sandi Mariscal M.D.   On: 11/13/2019 08:14   Ct Abdomen Pelvis W Contrast  Result Date: 10/20/2019 CLINICAL DATA:  Follow-up endometrial cancer, status post hysterectomy, no adjuvant therapy EXAM: CT CHEST, ABDOMEN, AND PELVIS WITH CONTRAST TECHNIQUE: Multidetector CT imaging of the chest, abdomen and pelvis was performed following the standard protocol during bolus administration of intravenous contrast. CONTRAST:  153m OMNIPAQUE IOHEXOL 300 MG/ML SOLN, additional oral enteric contrast COMPARISON:  CT chest abdomen pelvis, 06/13/2019, CT chest angiogram, 10/14/2018 FINDINGS: CT CHEST FINDINGS Cardiovascular: Right chest port catheter. Aortic atherosclerosis. Normal heart size. No pericardial effusion. Mediastinum/Nodes: No enlarged mediastinal, hilar, or axillary lymph nodes. Thyroid gland, trachea, and esophagus demonstrate no significant findings. Lungs/Pleura: Unchanged 4 mm ground-glass pulmonary nodule of the left pulmonary apex (series 3, image 24). No pleural effusion or  pneumothorax. Musculoskeletal: No chest wall mass or suspicious bone lesions identified. CT ABDOMEN PELVIS FINDINGS Hepatobiliary: No focal liver abnormality is seen. Status post cholecystectomy. No biliary dilatation. Pancreas: Unremarkable. No pancreatic ductal dilatation or surrounding inflammatory changes. Spleen: Normal in size without significant abnormality. Adrenals/Urinary Tract: Adrenal glands are unremarkable. Kidneys are normal, without renal calculi, solid lesion, or hydronephrosis. Bladder is unremarkable. Stomach/Bowel: Stomach is within normal limits. Appendix appears normal. No evidence of bowel wall thickening, distention, or inflammatory changes. Descending colonic diverticula. Vascular/Lymphatic: Aortic atherosclerosis. There are multiple newly enlarged retroperitoneal and right iliac lymph nodes, the largest left retroperitoneal node measuring 1.9 x 1.6 cm (series 2, image 71). Reproductive: Status post hysterectomy. Other: No abdominal wall hernia or abnormality. No abdominopelvic ascites. Musculoskeletal: No acute or significant osseous findings. IMPRESSION: 1.  Status post interval hysterectomy. 2. There are multiple newly enlarged retroperitoneal and right iliac lymph nodes, the largest left retroperitoneal node measuring 1.9 x 1.6 cm (series 2, image 71), concerning for nodal metastatic disease. 3. Unchanged 4 mm ground-glass pulmonary nodule of the left pulmonary apex (series 3, image 24). This remains nonspecific although is again an unlikely manifestation of pulmonary metastatic disease. 4. Other chronic, incidental, and postoperative findings as above. Aortic Atherosclerosis (ICD10-I70.0). Electronically Signed   By: AEddie CandleM.D.   On: 10/20/2019 10:25    ASSESSMENT & PLAN:  1. [place] ([place]   2. Microcytic anemia   3. Cardiomyopathy, unspecified type ([place]   4. 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 Labs are reviewed and discussed with patient. Counts  acceptable and is stable.  She tolerates chemotherapy well. We discussed about increasing carboplatin dose to standard AUC of 5 at next visit.  Should voices understanding and agrees with the plan.  #Microcytic anemia, hemoglobin 9.9.  She received 1 unit of PRBC on 11/09/2019.   Anemia was considered to be multifactorial secondary to chemotherapy/CKD/functional iron deficiency. She is taking oral iron supplementation #GERD, advised patient to continue omeprazole.  She is currently taking 20 mg daily.  Today her acid reflux symptoms are okay. We discussed about dietary changes, caffeine avoidance and discussed about adding omeprazole 20 mg in the evening.  She can also use Tums as well.  #Herpes infection of vagina.  Diagnosed emergency room.   #Rheumatoid arthritis, continue follow-up rheumatology.  Chronic joint pain is better on chemotherapy due to immunosuppression.  Return of visit: 2week for assessment before next cycle of treatment. Earlie Server, MD, PhD Hematology Oncology [facility] Pager- [phone] 11/14/2019

## 2019-11-28 ENCOUNTER — Encounter: Payer: Self-pay | Admitting: Oncology

## 2019-11-28 ENCOUNTER — Inpatient Hospital Stay: Payer: Medicare Other

## 2019-11-28 ENCOUNTER — Inpatient Hospital Stay (HOSPITAL_BASED_OUTPATIENT_CLINIC_OR_DEPARTMENT_OTHER): Payer: Medicare Other | Admitting: Oncology

## 2019-11-28 ENCOUNTER — Other Ambulatory Visit: Payer: Self-pay

## 2019-11-28 ENCOUNTER — Inpatient Hospital Stay: Payer: Medicare Other | Attending: Obstetrics and Gynecology

## 2019-11-28 ENCOUNTER — Ambulatory Visit: Payer: Medicare Other

## 2019-11-28 VITALS — BP 149/88 | HR 73 | Temp 99.0°F | Resp 18 | Wt 179.7 lb

## 2019-11-28 DIAGNOSIS — M069 Rheumatoid arthritis, unspecified: Secondary | ICD-10-CM | POA: Diagnosis not present

## 2019-11-28 DIAGNOSIS — C541 Malignant neoplasm of endometrium: Secondary | ICD-10-CM

## 2019-11-28 DIAGNOSIS — Z452 Encounter for adjustment and management of vascular access device: Secondary | ICD-10-CM | POA: Insufficient documentation

## 2019-11-28 DIAGNOSIS — D649 Anemia, unspecified: Secondary | ICD-10-CM

## 2019-11-28 DIAGNOSIS — K219 Gastro-esophageal reflux disease without esophagitis: Secondary | ICD-10-CM

## 2019-11-28 DIAGNOSIS — G2581 Restless legs syndrome: Secondary | ICD-10-CM

## 2019-11-28 DIAGNOSIS — Z5111 Encounter for antineoplastic chemotherapy: Secondary | ICD-10-CM

## 2019-11-28 DIAGNOSIS — Z95828 Presence of other vascular implants and grafts: Secondary | ICD-10-CM

## 2019-11-28 LAB — CBC WITH DIFFERENTIAL/PLATELET
Abs Immature Granulocytes: 0.11 10*3/uL — ABNORMAL HIGH (ref 0.00–0.07)
Basophils Absolute: 0 10*3/uL (ref 0.0–0.1)
Basophils Relative: 0 %
Eosinophils Absolute: 1.2 10*3/uL — ABNORMAL HIGH (ref 0.0–0.5)
Eosinophils Relative: 11 %
HCT: 31.6 % — ABNORMAL LOW (ref 36.0–46.0)
Hemoglobin: 9.3 g/dL — ABNORMAL LOW (ref 12.0–15.0)
Immature Granulocytes: 1 %
Lymphocytes Relative: 18 %
Lymphs Abs: 1.8 10*3/uL (ref 0.7–4.0)
MCH: 23.6 pg — ABNORMAL LOW (ref 26.0–34.0)
MCHC: 29.4 g/dL — ABNORMAL LOW (ref 30.0–36.0)
MCV: 80.2 fL (ref 80.0–100.0)
Monocytes Absolute: 0.8 10*3/uL (ref 0.1–1.0)
Monocytes Relative: 8 %
Neutro Abs: 6.5 10*3/uL (ref 1.7–7.7)
Neutrophils Relative %: 62 %
Platelets: 340 10*3/uL (ref 150–400)
RBC: 3.94 MIL/uL (ref 3.87–5.11)
RDW: 25.9 % — ABNORMAL HIGH (ref 11.5–15.5)
WBC: 10.4 10*3/uL (ref 4.0–10.5)
nRBC: 0 % (ref 0.0–0.2)

## 2019-11-28 LAB — COMPREHENSIVE METABOLIC PANEL
ALT: 8 U/L (ref 0–44)
AST: 14 U/L — ABNORMAL LOW (ref 15–41)
Albumin: 3.5 g/dL (ref 3.5–5.0)
Alkaline Phosphatase: 86 U/L (ref 38–126)
Anion gap: 9 (ref 5–15)
BUN: 15 mg/dL (ref 8–23)
CO2: 23 mmol/L (ref 22–32)
Calcium: 9 mg/dL (ref 8.9–10.3)
Chloride: 105 mmol/L (ref 98–111)
Creatinine, Ser: 0.72 mg/dL (ref 0.44–1.00)
GFR calc Af Amer: >60 mL/min (ref >60)
GFR calc non Af Amer: >60 mL/min (ref >60)
Glucose, Bld: 126 mg/dL — ABNORMAL HIGH (ref 70–99)
Potassium: 3.4 mmol/L — ABNORMAL LOW (ref 3.5–5.1)
Sodium: 137 mmol/L (ref 135–145)
Total Bilirubin: 0.4 mg/dL (ref 0.3–1.2)
Total Protein: 7.7 g/dL (ref 6.5–8.1)

## 2019-11-28 MED ORDER — DIPHENHYDRAMINE HCL 50 MG/ML IJ SOLN
50.0000 mg | Freq: Once | INTRAMUSCULAR | Status: AC
  Administered 2019-11-28: 50 mg via INTRAVENOUS
  Filled 2019-11-28: qty 1

## 2019-11-28 MED ORDER — FAMOTIDINE IN NACL 20-0.9 MG/50ML-% IV SOLN
20.0000 mg | Freq: Once | INTRAVENOUS | Status: AC
  Administered 2019-11-28: 20 mg via INTRAVENOUS
  Filled 2019-11-28: qty 50

## 2019-11-28 MED ORDER — SODIUM CHLORIDE 0.9 % IV SOLN
175.0000 mg/m2 | Freq: Once | INTRAVENOUS | Status: AC
  Administered 2019-11-28: 330 mg via INTRAVENOUS
  Filled 2019-11-28: qty 55

## 2019-11-28 MED ORDER — SODIUM CHLORIDE 0.9% FLUSH
10.0000 mL | Freq: Once | INTRAVENOUS | Status: AC
  Administered 2019-11-28: 10:00:00 10 mL via INTRAVENOUS
  Filled 2019-11-28: qty 10

## 2019-11-28 MED ORDER — HEPARIN SOD (PORK) LOCK FLUSH 100 UNIT/ML IV SOLN
500.0000 [IU] | Freq: Once | INTRAVENOUS | Status: AC | PRN
  Administered 2019-11-28: 500 [IU]
  Filled 2019-11-28 (×2): qty 5

## 2019-11-28 MED ORDER — SODIUM CHLORIDE 0.9 % IV SOLN
Freq: Once | INTRAVENOUS | Status: AC
  Administered 2019-11-28: 11:00:00 via INTRAVENOUS
  Filled 2019-11-28: qty 5

## 2019-11-28 MED ORDER — PALONOSETRON HCL INJECTION 0.25 MG/5ML
0.2500 mg | Freq: Once | INTRAVENOUS | Status: AC
  Administered 2019-11-28: 0.25 mg via INTRAVENOUS
  Filled 2019-11-28: qty 5

## 2019-11-28 MED ORDER — SODIUM CHLORIDE 0.9 % IV SOLN
Freq: Once | INTRAVENOUS | Status: AC
  Administered 2019-11-28: 11:00:00 via INTRAVENOUS
  Filled 2019-11-28: qty 250

## 2019-11-28 MED ORDER — LORAZEPAM 2 MG/ML IJ SOLN
0.5000 mg | Freq: Once | INTRAMUSCULAR | Status: AC
  Administered 2019-11-28: 0.5 mg via INTRAVENOUS
  Filled 2019-11-28: qty 1

## 2019-11-28 MED ORDER — PEGFILGRASTIM 6 MG/0.6ML ~~LOC~~ PSKT
6.0000 mg | PREFILLED_SYRINGE | Freq: Once | SUBCUTANEOUS | Status: AC
  Administered 2019-11-28: 6 mg via SUBCUTANEOUS
  Filled 2019-11-28: qty 0.6

## 2019-11-28 MED ORDER — SODIUM CHLORIDE 0.9 % IV SOLN
478.0000 mg | Freq: Once | INTRAVENOUS | Status: AC
  Administered 2019-11-28: 480 mg via INTRAVENOUS
  Filled 2019-11-28: qty 48

## 2019-11-28 NOTE — Progress Notes (Signed)
Pt tolerated infusion well, pt stable at discharge.  

## 2019-11-28 NOTE — Progress Notes (Signed)
Patient does not offer any problems today.  

## 2019-11-28 NOTE — Progress Notes (Signed)
Hematology/Oncology follow up note [facility] Telephone:([phone]) [phone] Fax:([phone]) [phone]   Patient Care Team: Sandra Eth, MD as PCP - General (Internal Medicine) Sandra Jacks, RN as Oncology Nurse Navigator  REFERRING PROVIDER: Felipa Eth, MD  CHIEF COMPLAINTS/REASON FOR VISIT:  Evaluation of endometrial cancer HISTORY OF PRESENTING ILLNESS:   Sandra Fischer is a  68 y.o.  female with PMH listed below was seen in consultation at the request of  Sandra Eth, MD  for evaluation of endometrial cancer Patient had TH/BSO sentinel lymph node biopsy by [facility]YN oncology Sandra Fischer on 06/21/2019. Extensive medical records from [place] system review was performed by me.  Tumor size 9.3 cm, invading 99% of myometrium [3.55 out of 3.6 cm], positive right external iliac sentinel lymph nodes.  Negative washing.  No LVI, serosa is uninvolved. 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 [id number]  FIGO stage IIIC1 MSI intact HER 2 negative.   Patient was seen by Dr. BFransisca Connorson 07/06/2019 due to vaginal odor. Patient presents for establishing care and discussion for adjuvant chemotherapy radiation.  Per Dr. BBlake Divinenote, [facility].  Portex 3 regimen was recommended given subgroup analysis showing benefit in the serous histology versus clinical trial. HER2 was ordered and pending results.   Patient has a history of SVT.  10/14/2018 2D echo reviewed moderately reduced LV function with LVEF 35 to 40% with diffuse hypokinesis.  Patient had Lexiscan Myoview on 01/19/2019.  LVEF 49%.  SPECT images revealed a mild fixed inferior wall defect, scar versus artifact.  No evidence of ischemia.  She follows up with cardiology Sandra Fischer 04/26/2019 with a plan to stay on her current medication. 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 She is married and providing care  for her mother.  # 10/20/2019 CT chest abdomen pelvis with contrast showed disease recurrence.  There are multiple newly enlarged retroperitoneal and right iliac lymph nodes the largest the left retroperitoneal node measuring 1.9 x 1.6 cm concerning for nodal metastasis disease.  Unchanged 4 mm groundglass pulmonary nodule of the left pulmonary apex.  This remains nonspecific. #Cardiomyopathy, LVEF35 to 40% Last seen by cardiology 04/26/2019.  Recommend patient to continue follow-up with cardiology.  # GERD  ER visit on 11/13/2019 due to chest pain which started after he ate at a restaurant and developed burning sensation in her chest.  No associated dyspnea. Patient had a CTA which showed no pulmonary embolism.  Patient symptoms got better after GI cocktail.  Was considered to be secondary to acid reflux.  Patient takes omeprazole 20 mg daily.   INTERVAL HISTORY [name] a 68y.o. female who has above history reviewed by me today presents for follow up visit for management of endometrial cancer. Problems and complaints are listed below: #Patient status post 1 cycle of carboplatin AUC 4.5 and Taxol. She tolerates well. Continues to have improved rheumatoid arthritis symptoms-less joint pain/swelling. Denies any nausea, vomiting, shortness of breath, chest pain, abdominal pain today. GERD, takes omeprazole 20 mg daily.  Symptoms has improved. Hair loss, reports thinning of her hair.  Review of Systems  Constitutional: Negative for appetite change, chills, fatigue and fever.  HENT:   Negative for hearing loss and voice change.   Eyes: Negative for eye problems.  Respiratory: Negative for chest tightness and cough.   Cardiovascular: Negative for chest pain.  Gastrointestinal: Negative for abdominal distention, abdominal pain and blood in stool.  Acid reflux  Endocrine: Negative for hot flashes.  Genitourinary: Negative for difficulty urinating and frequency.   Musculoskeletal:  Negative for arthralgias.  Skin: Negative for itching and rash.       Hair loss  Neurological: Negative for extremity weakness.  Hematological: Negative for adenopathy.  Psychiatric/Behavioral: Negative for confusion.    MEDICAL HISTORY:  Past Medical History:  Diagnosis Date   Asthma    Asthma    Asthma exacerbation 08/22/2016   Cancer ([place])    Collagen vascular disease ([place])    Diabetes mellitus without complication ([place])    History of Diabetes    Environmental allergies    Fibromyalgia    High cholesterol    Hypertension    RA (rheumatoid arthritis) ([place])    Thyroid disease     SURGICAL HISTORY: Past Surgical History:  Procedure Laterality Date   CATARACT EXTRACTION     CHOLECYSTECTOMY     fibroids removed     THYROIDECTOMY, PARTIAL     tummy tuck      SOCIAL HISTORY: Social History   Socioeconomic History   Marital status: Married    Spouse name: Not on file   Number of children: Not on file   Years of education: Not on file   Highest education level: Not on file  Occupational History   Not on file  Social Needs   Financial resource strain: Not on file   Food insecurity    Worry: Not on file    Inability: Not on file   Transportation needs    Medical: Not on file    Non-medical: Not on file  Tobacco Use   Smoking status: Never Smoker   Smokeless tobacco: Never Used  Substance and Sexual Activity   Alcohol use: No   Drug use: No   Sexual activity: Yes    Birth control/protection: Post-menopausal  Lifestyle   Physical activity    Days per week: Not on file    Minutes per session: Not on file   Stress: Not on file  Relationships   Social connections    Talks on phone: Not on file    Gets together: Not on file    Attends religious service: Not on file    Active member of club or organization: Not on file    Attends meetings of clubs or organizations: Not on file    Relationship status: Not on file   Intimate  partner violence    Fear of current or ex partner: Not on file    Emotionally abused: Not on file    Physically abused: Not on file    Forced sexual activity: Not on file  Other Topics Concern   Not on file  Social History Narrative   Not on file    FAMILY HISTORY: Family History  Problem Relation Age of Onset   Diabetes Mother    Hypertension Mother    Hyperlipidemia Mother    Dementia Mother    Breast cancer Neg Hx    Ovarian cancer Neg Hx    Colon cancer Neg Hx     ALLERGIES:  is allergic to abatacept; codeine; latex; shellfish allergy; and penicillins.  MEDICATIONS:  Current Outpatient Medications  Medication Sig Dispense Refill   albuterol (ACCUNEB) 1.25 MG/3ML nebulizer solution Take 1 ampule by nebulization every 6 (six) hours as needed for wheezing.     albuterol (PROVENTIL HFA;VENTOLIN HFA) 108 (90 Base) MCG/ACT inhaler Inhale 2 puffs into the lungs every 6 (six) hours as  needed for wheezing or shortness of breath. 1 Inhaler 0   aspirin EC 81 MG tablet Take 81 mg by mouth daily.     azelastine (ASTELIN) 0.1 % nasal spray Place 2 sprays into both nostrils 2 (two) times daily. 30 mL 5   budesonide-formoterol (SYMBICORT) 160-4.5 MCG/ACT inhaler Inhale 2 puffs into the lungs 2 (two) times daily.     calcium carbonate (OS-CAL - DOSED IN MG OF ELEMENTAL CALCIUM) 1250 (500 Ca) MG tablet Take 1 tablet by mouth daily with breakfast.      CALCIUM PO Take 1 tablet by mouth daily.     cetirizine (ZYRTEC) 10 MG tablet Take 10 mg by mouth daily.     Cholecalciferol (D3 ADULT PO) Take 1 capsule by mouth daily.     desloratadine (CLARINEX) 5 MG tablet Take 5 mg by mouth daily.     dexamethasone (DECADRON) 4 MG tablet Take 2 tablets by mouth once a day starting the day after chemotherapy. Continue for 3 days total. Take with food. 30 tablet 1   docusate sodium (COLACE) 50 MG capsule Take 50 mg by mouth 2 (two) times daily.     EPINEPHrine 0.3 mg/0.3 mL IJ SOAJ  injection Use as directed for severe allergic reaction. 2 Device 1   ferrous sulfate 325 (65 FE) MG EC tablet Take 1 tablet (325 mg total) by mouth 2 (two) times daily with a meal. 60 tablet 1   fluticasone (FLONASE) 50 MCG/ACT nasal spray Place 1 spray into both nostrils daily.      guaiFENesin-dextromethorphan (ROBITUSSIN DM) 100-10 MG/5ML syrup Take 5 mLs by mouth every 4 (four) hours as needed for cough. 118 mL 0   ipratropium (ATROVENT) 0.02 % nebulizer solution Take 0.5 mg by nebulization 4 (four) times daily.     leflunomide (ARAVA) 20 MG tablet Take 20 mg by mouth daily.     levothyroxine (SYNTHROID, LEVOTHROID) 88 MCG tablet Take 88 mcg by mouth daily before breakfast.     lidocaine-prilocaine (EMLA) cream Apply to affected area once (Patient taking differently: Apply 1 application topically as needed (to port). ) 30 g 3   lisinopril (PRINIVIL,ZESTRIL) 40 MG tablet Take 40 mg by mouth daily.     metoprolol succinate (TOPROL-XL) 50 MG 24 hr tablet Take 50 mg by mouth daily. Take with or immediately following a meal.     montelukast (SINGULAIR) 10 MG tablet Take 10 mg by mouth at bedtime.     omeprazole (PRILOSEC) 10 MG capsule Take 20 mg by mouth daily.     pravastatin (PRAVACHOL) 40 MG tablet Take 40 mg by mouth daily.     Prenatal Vit-Fe Fumarate-FA (MULTIVITAMIN-PRENATAL) 27-0.8 MG TABS tablet Take 1 tablet by mouth daily.      prochlorperazine (COMPAZINE) 10 MG tablet Take 1 tablet (10 mg total) by mouth every 6 (six) hours as needed (Nausea or vomiting). 30 tablet 1   senna-docusate (SENOKOT-S) 8.6-50 MG tablet Take 1 tablet by mouth at bedtime as needed for mild constipation.      tiotropium (SPIRIVA) 18 MCG inhalation capsule Place 18 mcg into inhaler and inhale daily.     traMADol (ULTRAM) 50 MG tablet Take 100 mg by mouth as needed (for rheumatoid arthritis).     verapamil (CALAN) 120 MG tablet Take 240 mg by mouth 2 (two) times daily.     olopatadine (PATANOL)  0.1 % ophthalmic solution Place 1 drop into both eyes 2 (two) times daily. (Patient not taking: Reported on 11/28/2019)  5 mL 5   valACYclovir (VALTREX) 1000 MG tablet Take 1 tablet (1,000 mg total) by mouth 2 (two) times daily. (Patient not taking: Reported on 11/28/2019) 20 tablet 0   No current facility-administered medications for this visit.    Facility-Administered Medications Ordered in Other Visits  Medication Dose Route Frequency Provider Last Rate Last Dose   CARBOplatin (PARAPLATIN) 480 mg in sodium chloride 0.9 % 250 mL chemo infusion  480 mg Intravenous Once Earlie Server, MD       heparin lock flush 100 unit/mL  500 Units Intracatheter Once PRN Earlie Server, MD       PACLitaxel (TAXOL) 330 mg in sodium chloride 0.9 % 500 mL chemo infusion (> 20m/m2)  175 mg/m2 (Treatment Plan Recorded) Intravenous Once YEarlie Server MD 185 mL/hr at 11/28/19 1204 330 mg at 11/28/19 1204   pegfilgrastim (NEULASTA ONPRO KIT) injection 6 mg  6 mg Subcutaneous Once YEarlie Server MD         PHYSICAL EXAMINATION: ECOG PERFORMANCE STATUS: 1 - Symptomatic but completely ambulatory  Physical Exam Vitals signs and nursing note reviewed.  Constitutional:      General: She is not in acute distress. HENT:     Head: Normocephalic and atraumatic.  Eyes:     General: No scleral icterus.    Pupils: Pupils are equal, round, and reactive to light.  Neck:     Musculoskeletal: Normal range of motion and neck supple.  Cardiovascular:     Rate and Rhythm: Normal rate and regular rhythm.     Heart sounds: Normal heart sounds.  Pulmonary:     Effort: Pulmonary effort is normal. No respiratory distress.     Breath sounds: No wheezing.  Abdominal:     General: Bowel sounds are normal. There is no distension.     Palpations: Abdomen is soft. There is no mass.     Tenderness: There is no abdominal tenderness.  Musculoskeletal: Normal range of motion.        General: No deformity.  Skin:    General: Skin is warm and dry.      Findings: No erythema or rash.  Neurological:     Mental Status: She is alert and oriented to person, place, and time.     Cranial Nerves: No cranial nerve deficit.     Coordination: Coordination normal.  Psychiatric:        Behavior: Behavior normal.        Thought Content: Thought content normal.     LABORATORY DATA:  I have reviewed the data as listed Lab Results  Component Value Date   WBC 10.4 11/28/2019   HGB 9.3 (L) 11/28/2019   HCT 31.6 (L) 11/28/2019   MCV 80.2 11/28/2019   PLT 340 11/28/2019   Recent Labs    11/13/19 0444 11/14/19 0943 11/28/19 0935  NA 137 133* 137  K 3.2* 3.4* 3.4*  CL 101 101 105  CO2 '23 24 23  ' GLUCOSE 93 107* 126*  BUN '15 16 15  ' CREATININE 0.77 0.61 0.72  CALCIUM 9.1 8.7* 9.0  GFRNONAA >60 >60 >60  GFRAA >60 >60 >60  PROT 7.0 7.2 7.7  ALBUMIN 3.0* 3.4* 3.5  AST 19 20 14*  ALT '21 17 8  ' ALKPHOS 68 74 86  BILITOT 0.4 0.8 0.4   Iron/TIBC/Ferritin/ %Sat    Component Value Date/Time   IRON 31 11/07/2019 0906   TIBC 187 (L) 11/07/2019 0906   FERRITIN 204 11/07/2019 0906   IRONPCTSAT 17 11/07/2019  0906      RADIOGRAPHIC STUDIES: I have personally reviewed the radiological images as listed and agreed with the findings in the report. Dg Chest 2 View  Result Date: 11/13/2019 CLINICAL DATA:  Chest pain EXAM: CHEST - 2 VIEW COMPARISON:  11/26/2018 FINDINGS: The heart size and mediastinal contours are within normal limits. Both lungs are clear. The visualized skeletal structures are unremarkable. There is a right chest wall power-injectable Port-A-Cath with tip at the cavoatrial junction via a right internal jugular vein approach. IMPRESSION: No active cardiopulmonary disease. Electronically Signed   By: Ulyses Jarred M.D.   On: 11/13/2019 05:28   Ct Angio Chest Pe W And/or Wo Contrast  Result Date: 11/13/2019 CLINICAL DATA:  Positive D-dimer. History of endometrial cancer. Evaluate for pulmonary embolism. EXAM: CT ANGIOGRAPHY CHEST  WITH CONTRAST TECHNIQUE: Multidetector CT imaging of the chest was performed using the standard protocol during bolus administration of intravenous contrast. Multiplanar CT image reconstructions and MIPs were obtained to evaluate the vascular anatomy. CONTRAST:  61 mL OMNIPAQUE IOHEXOL 350 MG/ML SOLN COMPARISON:  Chest CT-10/14/2018; CT chest, abdomen and pelvis-10/20/2019; 06/13/2019 FINDINGS: Vascular Findings: There is adequate opacification of the pulmonary arterial system with the main pulmonary artery measuring 429 Hounsfield units. There are no discrete filling defects within the pulmonary arterial tree to suggest pulmonary embolism. Normal caliber the main pulmonary artery. Cardiomegaly. Calcifications about the aortic root. No pericardial effusion though a small amount of fluid is seen within the pericardial recess. No evidence of thoracic aortic aneurysm or dissection on this nongated examination. Bovine configuration of the aortic arch. The branch vessels of the aortic arch appear widely patent throughout their imaged courses. Right jugular approach port a catheter tip terminates within the superior cavoatrial junction. Review of the MIP images confirms the above findings. ---------------------------------------------------------------------------------- Nonvascular Findings: Mediastinum/Lymph Nodes: No bulky mediastinal, hilar axillary lymphadenopathy. Lungs/Pleura: There is a minimal amount of dependent subpleural ground-glass atelectasis. No discrete focal airspace opacities. No pleural effusion or pneumothorax. The central pulmonary airways appear patent. No new pulmonary nodules. Punctate (approximately 4 mm) nodule within the left lung apex (image 23, series 6) is unchanged compared to the 06/13/2019 examination, not definitely seen on the 09/2018 exam. Upper abdomen: Limited evaluation of the upper abdomen demonstrates mild nodularity hepatic contour as could be seen in the setting of early  cirrhotic change. No acute or aggressive osseous abnormalities. Moderate DDD of C5-C6 with disc space height loss, endplate irregularity and sclerosis. Musculoskeletal: No acute or aggressive osseous abnormalities. Moderate DDD of C5-C6 with disc space height loss, endplate irregularity and sclerosis. Regional soft tissues appear normal. IMPRESSION: 1. No acute cardiopulmonary disease. Specifically, no evidence of pulmonary embolism. 2. Indeterminate punctate (approximately 4 mm) left apical pulmonary nodule is unchanged compared to the 05/2019 examination of uncertain though doubtful clinical concern. Electronically Signed   By: Sandi Mariscal M.D.   On: 11/13/2019 08:14    ASSESSMENT & PLAN:  1. Endometrial cancer ([place])   2. Gastroesophageal reflux disease, unspecified whether esophagitis present   3. Encounter for antineoplastic chemotherapy   4. Rheumatoid arthritis, involving unspecified site, unspecified whether rheumatoid factor present ([place])   5. Anemia, unspecified type   6. Restless leg syndrome    #Recurrent endometrial cancer Labs are reviewed and discussed with patient.  Counts acceptable to proceed with cycle 2 carbo and Taxol Discussed with patient.  Since she tolerated cycle 1 dose reduced carboplatin nicely.  I will increase carboplatin to AUC of 5.  #Microcytic anemia,  hemoglobin 9.3.  She received 1 unit of PRBC on 11/09/2019.   Anemia has a combination of chemotherapy/CKD/functional iron deficiency. IV iron was previously discussed and she declines. Continue oral iron supplementation. Repeat blood work in 10 days.  Hold tubes, possible blood transfusion  #GERD, continue omeprazole 20 mg daily. #Rheumatoid arthritis, currently off any rheumatoid arthritis treatments. Symptoms are better while on chemotherapy.  Continue to monitor. Recommend patient continue to follow-up with rheumatology #Hair loss, secondary to chemotherapy side effects.  Discussed with patient. #Restless  leg syndrome, worsening after received Benadryl as a premed today.  Ativan 0.5 mg IV x1 was given. Will avoid Benadryl at the next chemo.  #Goals of care, curative.  Return of visit:  Lab MD 10 days for assessment of tolerability Lab MD 3 weeks for evaluation prior to cycle 3 treatments.  Earlie Server, MD, PhD Hematology Oncology [facility] Pager- [phone] 11/28/2019

## 2019-12-07 ENCOUNTER — Other Ambulatory Visit: Payer: Self-pay

## 2019-12-07 NOTE — Progress Notes (Signed)
Patient pre screened for office appointment, no questions or concerns today. Patient reminded of upcoming appointment time and date. 

## 2019-12-08 ENCOUNTER — Inpatient Hospital Stay: Payer: Medicare Other | Attending: Oncology

## 2019-12-08 ENCOUNTER — Encounter: Payer: Self-pay | Admitting: Oncology

## 2019-12-08 ENCOUNTER — Inpatient Hospital Stay (HOSPITAL_BASED_OUTPATIENT_CLINIC_OR_DEPARTMENT_OTHER): Payer: Medicare Other | Admitting: Oncology

## 2019-12-08 ENCOUNTER — Other Ambulatory Visit: Payer: Self-pay

## 2019-12-08 ENCOUNTER — Inpatient Hospital Stay: Payer: Medicare Other

## 2019-12-08 VITALS — BP 144/89 | HR 94 | Temp 98.5°F | Resp 18 | Wt 180.8 lb

## 2019-12-08 DIAGNOSIS — K219 Gastro-esophageal reflux disease without esophagitis: Secondary | ICD-10-CM | POA: Diagnosis not present

## 2019-12-08 DIAGNOSIS — D631 Anemia in chronic kidney disease: Secondary | ICD-10-CM | POA: Insufficient documentation

## 2019-12-08 DIAGNOSIS — E1122 Type 2 diabetes mellitus with diabetic chronic kidney disease: Secondary | ICD-10-CM | POA: Insufficient documentation

## 2019-12-08 DIAGNOSIS — Z95828 Presence of other vascular implants and grafts: Secondary | ICD-10-CM | POA: Diagnosis not present

## 2019-12-08 DIAGNOSIS — G620 Drug-induced polyneuropathy: Secondary | ICD-10-CM | POA: Insufficient documentation

## 2019-12-08 DIAGNOSIS — I1310 Hypertensive heart and chronic kidney disease without heart failure, with stage 1 through stage 4 chronic kidney disease, or unspecified chronic kidney disease: Secondary | ICD-10-CM | POA: Insufficient documentation

## 2019-12-08 DIAGNOSIS — C541 Malignant neoplasm of endometrium: Secondary | ICD-10-CM | POA: Insufficient documentation

## 2019-12-08 DIAGNOSIS — I429 Cardiomyopathy, unspecified: Secondary | ICD-10-CM | POA: Insufficient documentation

## 2019-12-08 DIAGNOSIS — D509 Iron deficiency anemia, unspecified: Secondary | ICD-10-CM

## 2019-12-08 DIAGNOSIS — D500 Iron deficiency anemia secondary to blood loss (chronic): Secondary | ICD-10-CM

## 2019-12-08 DIAGNOSIS — Z5189 Encounter for other specified aftercare: Secondary | ICD-10-CM | POA: Diagnosis not present

## 2019-12-08 DIAGNOSIS — Z452 Encounter for adjustment and management of vascular access device: Secondary | ICD-10-CM | POA: Diagnosis not present

## 2019-12-08 DIAGNOSIS — N189 Chronic kidney disease, unspecified: Secondary | ICD-10-CM | POA: Insufficient documentation

## 2019-12-08 DIAGNOSIS — K1230 Oral mucositis (ulcerative), unspecified: Secondary | ICD-10-CM | POA: Insufficient documentation

## 2019-12-08 DIAGNOSIS — Z5111 Encounter for antineoplastic chemotherapy: Secondary | ICD-10-CM | POA: Insufficient documentation

## 2019-12-08 DIAGNOSIS — M069 Rheumatoid arthritis, unspecified: Secondary | ICD-10-CM | POA: Insufficient documentation

## 2019-12-08 LAB — CBC WITH DIFFERENTIAL/PLATELET
Abs Immature Granulocytes: 2.75 10*3/uL — ABNORMAL HIGH (ref 0.00–0.07)
Basophils Absolute: 0.2 10*3/uL — ABNORMAL HIGH (ref 0.0–0.1)
Basophils Relative: 1 %
Eosinophils Absolute: 0.1 10*3/uL (ref 0.0–0.5)
Eosinophils Relative: 0 %
HCT: 31.8 % — ABNORMAL LOW (ref 36.0–46.0)
Hemoglobin: 9.4 g/dL — ABNORMAL LOW (ref 12.0–15.0)
Immature Granulocytes: 12 %
Lymphocytes Relative: 15 %
Lymphs Abs: 3.3 10*3/uL (ref 0.7–4.0)
MCH: 24.1 pg — ABNORMAL LOW (ref 26.0–34.0)
MCHC: 29.6 g/dL — ABNORMAL LOW (ref 30.0–36.0)
MCV: 81.5 fL (ref 80.0–100.0)
Monocytes Absolute: 1.4 10*3/uL — ABNORMAL HIGH (ref 0.1–1.0)
Monocytes Relative: 6 %
Neutro Abs: 14.9 10*3/uL — ABNORMAL HIGH (ref 1.7–7.7)
Neutrophils Relative %: 66 %
Platelets: 214 10*3/uL (ref 150–400)
RBC: 3.9 MIL/uL (ref 3.87–5.11)
RDW: 27.4 % — ABNORMAL HIGH (ref 11.5–15.5)
WBC: 22.6 10*3/uL — ABNORMAL HIGH (ref 4.0–10.5)
nRBC: 0.4 % — ABNORMAL HIGH (ref 0.0–0.2)

## 2019-12-08 LAB — COMPREHENSIVE METABOLIC PANEL
ALT: 12 U/L (ref 0–44)
AST: 16 U/L (ref 15–41)
Albumin: 3.9 g/dL (ref 3.5–5.0)
Alkaline Phosphatase: 120 U/L (ref 38–126)
Anion gap: 8 (ref 5–15)
BUN: 12 mg/dL (ref 8–23)
CO2: 25 mmol/L (ref 22–32)
Calcium: 9.3 mg/dL (ref 8.9–10.3)
Chloride: 103 mmol/L (ref 98–111)
Creatinine, Ser: 0.73 mg/dL (ref 0.44–1.00)
GFR calc Af Amer: >60 mL/min (ref >60)
GFR calc non Af Amer: >60 mL/min (ref >60)
Glucose, Bld: 131 mg/dL — ABNORMAL HIGH (ref 70–99)
Potassium: 3.7 mmol/L (ref 3.5–5.1)
Sodium: 136 mmol/L (ref 135–145)
Total Bilirubin: 0.4 mg/dL (ref 0.3–1.2)
Total Protein: 7.6 g/dL (ref 6.5–8.1)

## 2019-12-08 LAB — SAMPLE TO BLOOD BANK

## 2019-12-08 MED ORDER — SODIUM CHLORIDE 0.9% FLUSH
10.0000 mL | Freq: Once | INTRAVENOUS | Status: AC
  Administered 2019-12-08: 10 mL via INTRAVENOUS
  Filled 2019-12-08: qty 10

## 2019-12-08 MED ORDER — HEPARIN SOD (PORK) LOCK FLUSH 100 UNIT/ML IV SOLN
500.0000 [IU] | Freq: Once | INTRAVENOUS | Status: AC
  Administered 2019-12-08: 500 [IU] via INTRAVENOUS
  Filled 2019-12-08: qty 5

## 2019-12-08 NOTE — Progress Notes (Signed)
Hematology/Oncology follow up note [facility] [phone] Fax:([phone]   Patient Care Team: Sandra Eth, MD as PCP - General (Internal Medicine) Sandra Jacks, RN as Oncology Nurse Navigator  REFERRING PROVIDER: Felipa Eth, MD  CHIEF COMPLAINTS/REASON FOR VISIT:  Follow up for chemotherapy for endometrial cancer HISTORY OF PRESENTING ILLNESS:   Sandra Fischer is a  68 y.o.  female with PMH listed below was seen in consultation at the request of  Sandra Eth, MD  for evaluation of endometrial cancer Patient had TH/BSO sentinel lymph node biopsy by [facility] GYN oncology Sandra Fischer on 06/21/2019. Extensive medical records from [place] system review was performed by me.  Tumor size 9.3 cm, invading 99% of myometrium [3.55 out of 3.6 cm], positive right external iliac sentinel lymph nodes.  Negative washing.  No LVI, serosa is uninvolved. 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 [id number]  FIGO stage IIIC1 MSI intact HER 2 negative.   Patient was seen by Dr. BFransisca Connorson 07/06/2019 due to vaginal odor. Patient presents for establishing care and discussion for adjuvant chemotherapy radiation.  Per Dr. BBlake Divinenote, [facility].  Portex 3 regimen was recommended given subgroup analysis showing benefit in the serous histology versus clinical trial. HER2 was ordered and pending results.   Patient has a history of SVT.  10/14/2018 2D echo reviewed moderately reduced LV function with LVEF 35 to 40% with diffuse hypokinesis.  Patient had Lexiscan Myoview on 01/19/2019.  LVEF 49%.  SPECT images revealed a mild fixed inferior wall defect, scar versus artifact.  No evidence of ischemia.  She follows up with cardiology Sandra Fischer 04/26/2019 with a plan to stay on her current medication. 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 She is married  and providing care for her mother.  # 10/20/2019 CT chest abdomen pelvis with contrast showed disease recurrence.  There are multiple newly enlarged retroperitoneal and right iliac lymph nodes the largest the left retroperitoneal node measuring 1.9 x 1.6 cm concerning for nodal metastasis disease.  Unchanged 4 mm groundglass pulmonary nodule of the left pulmonary apex.  This remains nonspecific. #Cardiomyopathy, LVEF35 to 40% Last seen by cardiology 04/26/2019.  Recommend patient to continue follow-up with cardiology.  # GERD  ER visit on 11/13/2019 due to chest pain which started after he ate at a restaurant and developed burning sensation in her chest.  No associated dyspnea. Patient had a CTA which showed no pulmonary embolism.  Patient symptoms got better after GI cocktail.  Was considered to be secondary to acid reflux.  Patient takes omeprazole 20 mg daily.   INTERVAL HISTORY BLacreasha Hindsis a 68y.o. female who has above history reviewed by me today presents for follow up visit for management of endometrial cancer. Problems and complaints are listed below: #Patient status post Cycle 2 carboplatin AUC 5 and Taxol. Patient tolerates with moderate difficulties. Patient reports mouth sores which affects her oral intake.  Mouth sore has improved today. She also feels profoundly fatigued after last chemo.  She requests to have close reduction to the same level as cycle 1.    Denies any fever, chills, nausea, vomiting.   GERD, she is taking omeprazole 20 mg daily given the symptoms has improved.   Continue to have vaginal itchiness.     Review of Systems  Constitutional: Negative for appetite change, chills, fatigue and fever.  HENT:   Negative for hearing loss  and voice change.        Mouth sore  Eyes: Negative for eye problems.  Respiratory: Negative for chest tightness and cough.   Cardiovascular: Negative for chest pain.  Gastrointestinal: Negative for abdominal distention, abdominal  pain and blood in stool.       Acid reflux  Endocrine: Negative for hot flashes.  Genitourinary: Negative for difficulty urinating and frequency.   Musculoskeletal: Negative for arthralgias.  Skin: Negative for itching and rash.       Hair loss  Neurological: Negative for extremity weakness.  Hematological: Negative for adenopathy.  Psychiatric/Behavioral: Negative for confusion.    MEDICAL HISTORY:  Past Medical History:  Diagnosis Date  . Asthma   . Asthma   . Asthma exacerbation 08/22/2016  . Cancer ([place])   . Collagen vascular disease ([place])   . Diabetes mellitus without complication ([place])    History of Diabetes   . Environmental allergies   . Fibromyalgia   . High cholesterol   . Hypertension   . RA (rheumatoid arthritis) ([place])   . Thyroid disease     SURGICAL HISTORY: Past Surgical History:  Procedure Laterality Date  . CATARACT EXTRACTION    . CHOLECYSTECTOMY    . fibroids removed    . THYROIDECTOMY, PARTIAL    . tummy tuck      SOCIAL HISTORY: Social History   Socioeconomic History  . Marital status: Married    Spouse name: Not on file  . Number of children: Not on file  . Years of education: Not on file  . Highest education level: Not on file  Occupational History  . Not on file  Tobacco Use  . Smoking status: Never Smoker  . Smokeless tobacco: Never Used  Substance and Sexual Activity  . Alcohol use: No  . Drug use: No  . Sexual activity: Yes    Birth control/protection: Post-menopausal  Other Topics Concern  . Not on file  Social History Narrative  . Not on file   Social Determinants of Health   Financial Resource Strain:   . Difficulty of Paying Living Expenses: Not on file  Food Insecurity:   . Worried About Charity fundraiser in the Last Year: Not on file  . Ran Out of Food in the Last Year: Not on file  Transportation Needs:   . Lack of Transportation (Medical): Not on file  . Lack of Transportation (Non-Medical): Not on file   Physical Activity:   . Days of Exercise per Week: Not on file  . Minutes of Exercise per Session: Not on file  Stress:   . Feeling of Stress : Not on file  Social Connections:   . Frequency of Communication with Friends and Family: Not on file  . Frequency of Social Gatherings with Friends and Family: Not on file  . Attends Religious Services: Not on file  . Active Member of Clubs or Organizations: Not on file  . Attends Archivist Meetings: Not on file  . Marital Status: Not on file  Intimate Partner Violence:   . Fear of Current or Ex-Partner: Not on file  . Emotionally Abused: Not on file  . Physically Abused: Not on file  . Sexually Abused: Not on file    FAMILY HISTORY: Family History  Problem Relation Age of Onset  . Diabetes Mother   . Hypertension Mother   . Hyperlipidemia Mother   . Dementia Mother   . Breast cancer Neg Hx   . Ovarian cancer  Neg Hx   . Colon cancer Neg Hx     ALLERGIES:  is allergic to abatacept; codeine; latex; shellfish allergy; and penicillins.  MEDICATIONS:  Current Outpatient Medications  Medication Sig Dispense Refill  . albuterol (ACCUNEB) 1.25 MG/3ML nebulizer solution Take 1 ampule by nebulization every 6 (six) hours as needed for wheezing.    Marland Kitchen albuterol (PROVENTIL HFA;VENTOLIN HFA) 108 (90 Base) MCG/ACT inhaler Inhale 2 puffs into the lungs every 6 (six) hours as needed for wheezing or shortness of breath. 1 Inhaler 0  . aspirin EC 81 MG tablet Take 81 mg by mouth daily.    Marland Kitchen azelastine (ASTELIN) 0.1 % nasal spray Place 2 sprays into both nostrils 2 (two) times daily. 30 mL 5  . budesonide-formoterol (SYMBICORT) 160-4.5 MCG/ACT inhaler Inhale 2 puffs into the lungs 2 (two) times daily.    . calcium carbonate (OS-CAL - DOSED IN MG OF ELEMENTAL CALCIUM) 1250 (500 Ca) MG tablet Take 1 tablet by mouth daily with breakfast.     . CALCIUM PO Take 1 tablet by mouth daily.    . cetirizine (ZYRTEC) 10 MG tablet Take 10 mg by mouth  daily.    . Cholecalciferol (D3 ADULT PO) Take 1 capsule by mouth daily.    Marland Kitchen desloratadine (CLARINEX) 5 MG tablet Take 5 mg by mouth daily.    Marland Kitchen dexamethasone (DECADRON) 4 MG tablet Take 2 tablets by mouth once a day starting the day after chemotherapy. Continue for 3 days total. Take with food. 30 tablet 1  . docusate sodium (COLACE) 50 MG capsule Take 50 mg by mouth 2 (two) times daily.    Marland Kitchen EPINEPHrine 0.3 mg/0.3 mL IJ SOAJ injection Use as directed for severe allergic reaction. 2 Device 1  . ferrous sulfate 325 (65 FE) MG EC tablet Take 1 tablet (325 mg total) by mouth 2 (two) times daily with a meal. 60 tablet 1  . fluticasone (FLONASE) 50 MCG/ACT nasal spray Place 1 spray into both nostrils daily.     Marland Kitchen guaiFENesin-dextromethorphan (ROBITUSSIN DM) 100-10 MG/5ML syrup Take 5 mLs by mouth every 4 (four) hours as needed for cough. 118 mL 0  . ipratropium (ATROVENT) 0.02 % nebulizer solution Take 0.5 mg by nebulization 4 (four) times daily.    Marland Kitchen leflunomide (ARAVA) 20 MG tablet Take 20 mg by mouth daily.    Marland Kitchen levothyroxine (SYNTHROID, LEVOTHROID) 88 MCG tablet Take 88 mcg by mouth daily before breakfast.    . lidocaine-prilocaine (EMLA) cream Apply to affected area once (Patient taking differently: Apply 1 application topically as needed (to port). ) 30 g 3  . lisinopril (PRINIVIL,ZESTRIL) 40 MG tablet Take 40 mg by mouth daily.    . metoprolol succinate (TOPROL-XL) 50 MG 24 hr tablet Take 50 mg by mouth daily. Take with or immediately following a meal.    . montelukast (SINGULAIR) 10 MG tablet Take 10 mg by mouth at bedtime.    Marland Kitchen olopatadine (PATANOL) 0.1 % ophthalmic solution Place 1 drop into both eyes 2 (two) times daily. 5 mL 5  . omeprazole (PRILOSEC) 10 MG capsule Take 20 mg by mouth daily.    . pravastatin (PRAVACHOL) 40 MG tablet Take 40 mg by mouth daily.    . Prenatal Vit-Fe Fumarate-FA (MULTIVITAMIN-PRENATAL) 27-0.8 MG TABS tablet Take 1 tablet by mouth daily.     .  prochlorperazine (COMPAZINE) 10 MG tablet Take 1 tablet (10 mg total) by mouth every 6 (six) hours as needed (Nausea or vomiting). 30 tablet  1  . senna-docusate (SENOKOT-S) 8.6-50 MG tablet Take 1 tablet by mouth at bedtime as needed for mild constipation.     Marland Kitchen tiotropium (SPIRIVA) 18 MCG inhalation capsule Place 18 mcg into inhaler and inhale daily.    . traMADol (ULTRAM) 50 MG tablet Take 100 mg by mouth as needed (for rheumatoid arthritis).    . valACYclovir (VALTREX) 1000 MG tablet Take 1 tablet (1,000 mg total) by mouth 2 (two) times daily. 20 tablet 0  . verapamil (CALAN) 120 MG tablet Take 240 mg by mouth 2 (two) times daily.     No current facility-administered medications for this visit.     PHYSICAL EXAMINATION: ECOG PERFORMANCE STATUS: 1 - Symptomatic but completely ambulatory  Physical Exam Vitals and nursing note reviewed.  Constitutional:      General: She is not in acute distress. HENT:     Head: Normocephalic and atraumatic.  Eyes:     General: No scleral icterus.    Pupils: Pupils are equal, round, and reactive to light.  Cardiovascular:     Rate and Rhythm: Normal rate and regular rhythm.     Heart sounds: Normal heart sounds.  Pulmonary:     Effort: Pulmonary effort is normal. No respiratory distress.     Breath sounds: No wheezing.  Abdominal:     General: Bowel sounds are normal. There is no distension.     Palpations: Abdomen is soft. There is no mass.     Tenderness: There is no abdominal tenderness.  Musculoskeletal:        General: No deformity. Normal range of motion.     Cervical back: Normal range of motion and neck supple.  Skin:    General: Skin is warm and dry.     Findings: No erythema or rash.  Neurological:     Mental Status: She is alert and oriented to person, place, and time.     Cranial Nerves: No cranial nerve deficit.     Coordination: Coordination normal.  Psychiatric:        Behavior: Behavior normal.        Thought Content:  Thought content normal.     LABORATORY DATA:  I have reviewed the data as listed Lab Results  Component Value Date   WBC 22.6 (H) 12/08/2019   HGB 9.4 (L) 12/08/2019   HCT 31.8 (L) 12/08/2019   MCV 81.5 12/08/2019   PLT 214 12/08/2019   Recent Labs    11/14/19 0943 11/28/19 0935 12/08/19 0921  NA 133* 137 136  K 3.4* 3.4* 3.7  CL 101 105 103  CO2 _0 GLUCOSE 107* 126* 131*  BUN _1 CREATININE 0.61 0.72 0.73  CALCIUM 8.7* 9.0 9.3  GFRNONAA >60 >60 >60  GFRAA >60 >60 >60  PROT 7.2 7.7 7.6  ALBUMIN 3.4* 3.5 3.9  AST 20 14* 16  ALT _2 ALKPHOS 74 86 120  BILITOT 0.8 0.4 0.4   Iron/TIBC/Ferritin/ %Sat    Component Value Date/Time   IRON 31 11/07/2019 0906   TIBC 187 (L) 11/07/2019 0906   FERRITIN 204 11/07/2019 0906   IRONPCTSAT 17 11/07/2019 0906      RADIOGRAPHIC STUDIES: I have personally reviewed the radiological images as listed and agreed with the findings in the report. DG Chest 2 View  Result Date: 11/13/2019 CLINICAL DATA:  Chest pain EXAM: CHEST - 2 VIEW COMPARISON:  11/26/2018 FINDINGS: The heart size and mediastinal contours are within normal limits.  Both lungs are clear. The visualized skeletal structures are unremarkable. There is a right chest wall power-injectable Port-A-Cath with tip at the cavoatrial junction via a right internal jugular vein approach. IMPRESSION: No active cardiopulmonary disease. Electronically Signed   By: Ulyses Jarred M.D.   On: 11/13/2019 05:28   CT Angio Chest PE W and/or Wo Contrast  Result Date: 11/13/2019 CLINICAL DATA:  Positive D-dimer. History of endometrial cancer. Evaluate for pulmonary embolism. EXAM: CT ANGIOGRAPHY CHEST WITH CONTRAST TECHNIQUE: Multidetector CT imaging of the chest was performed using the standard protocol during bolus administration of intravenous contrast. Multiplanar CT image reconstructions and MIPs were obtained to evaluate the vascular anatomy. CONTRAST:  61 mL OMNIPAQUE  IOHEXOL 350 MG/ML SOLN COMPARISON:  Chest CT-10/14/2018; CT chest, abdomen and pelvis-10/20/2019; 06/13/2019 FINDINGS: Vascular Findings: There is adequate opacification of the pulmonary arterial system with the main pulmonary artery measuring 429 Hounsfield units. There are no discrete filling defects within the pulmonary arterial tree to suggest pulmonary embolism. Normal caliber the main pulmonary artery. Cardiomegaly. Calcifications about the aortic root. No pericardial effusion though a small amount of fluid is seen within the pericardial recess. No evidence of thoracic aortic aneurysm or dissection on this nongated examination. Bovine configuration of the aortic arch. The branch vessels of the aortic arch appear widely patent throughout their imaged courses. Right jugular approach port a catheter tip terminates within the superior cavoatrial junction. Review of the MIP images confirms the above findings. ---------------------------------------------------------------------------------- Nonvascular Findings: Mediastinum/Lymph Nodes: No bulky mediastinal, hilar axillary lymphadenopathy. Lungs/Pleura: There is a minimal amount of dependent subpleural ground-glass atelectasis. No discrete focal airspace opacities. No pleural effusion or pneumothorax. The central pulmonary airways appear patent. No new pulmonary nodules. Punctate (approximately 4 mm) nodule within the left lung apex (image 23, series 6) is unchanged compared to the 06/13/2019 examination, not definitely seen on the 09/2018 exam. Upper abdomen: Limited evaluation of the upper abdomen demonstrates mild nodularity hepatic contour as could be seen in the setting of early cirrhotic change. No acute or aggressive osseous abnormalities. Moderate DDD of C5-C6 with disc space height loss, endplate irregularity and sclerosis. Musculoskeletal: No acute or aggressive osseous abnormalities. Moderate DDD of C5-C6 with disc space height loss, endplate  irregularity and sclerosis. Regional soft tissues appear normal. IMPRESSION: 1. No acute cardiopulmonary disease. Specifically, no evidence of pulmonary embolism. 2. Indeterminate punctate (approximately 4 mm) left apical pulmonary nodule is unchanged compared to the 05/2019 examination of uncertain though doubtful clinical concern. Electronically Signed   By: Sandi Mariscal M.D.   On: 11/13/2019 08:14    ASSESSMENT & PLAN:  1. Endometrial cancer ([place])   2. Port-A-Cath in place   3. Gastroesophageal reflux disease, unspecified whether esophagitis present   4. Microcytic anemia    #Recurrent endometrial cancer Status post 2 cycles of adjuvant carboplatin and Taxol. Carboplatin was given at a dose of AUC of 4.5 and cycle 1 which she tolerates well, carboplatin dose was increased to AUC of 5 Patient feels difficult to tolerate and requests dose reduction. From cycle 3, she will be getting carboplatin AUC 4.5.  #Mucositis, already improved.  Advised patient to continue use Mouth rinse. #Microcytic anemia, hemoglobin is stable..    Anemia has a combination of chemotherapy/CKD/functional iron deficiency. IV iron was previously discussed and she declines. Continue oral iron supplementation.  #GERD, continue omeprazole 20 mg daily. #Rheumatoid arthritis, currently off any rheumatoid arthritis treatments. #Vaginal itchiness, patient declines  Appointment to see nurse practitioner for regular examination.  Return of visit: 12/19/2019 cycle 3, Carboplatin Taxol. Marland Kitchen  Earlie Server, MD, PhD Hematology Oncology [facility] at [facility] Pager- [phone] 12/08/2019

## 2019-12-19 ENCOUNTER — Inpatient Hospital Stay: Payer: Medicare Other

## 2019-12-19 ENCOUNTER — Inpatient Hospital Stay (HOSPITAL_BASED_OUTPATIENT_CLINIC_OR_DEPARTMENT_OTHER): Payer: Medicare Other | Admitting: Oncology

## 2019-12-19 ENCOUNTER — Other Ambulatory Visit: Payer: Self-pay

## 2019-12-19 ENCOUNTER — Encounter: Payer: Self-pay | Admitting: Oncology

## 2019-12-19 VITALS — BP 146/85 | HR 76 | Temp 97.9°F | Resp 16 | Wt 179.3 lb

## 2019-12-19 DIAGNOSIS — C541 Malignant neoplasm of endometrium: Secondary | ICD-10-CM | POA: Diagnosis not present

## 2019-12-19 DIAGNOSIS — D500 Iron deficiency anemia secondary to blood loss (chronic): Secondary | ICD-10-CM | POA: Diagnosis not present

## 2019-12-19 DIAGNOSIS — K219 Gastro-esophageal reflux disease without esophagitis: Secondary | ICD-10-CM

## 2019-12-19 DIAGNOSIS — Z5111 Encounter for antineoplastic chemotherapy: Secondary | ICD-10-CM

## 2019-12-19 DIAGNOSIS — M069 Rheumatoid arthritis, unspecified: Secondary | ICD-10-CM

## 2019-12-19 LAB — COMPREHENSIVE METABOLIC PANEL
ALT: 8 U/L (ref 0–44)
AST: 15 U/L (ref 15–41)
Albumin: 4 g/dL (ref 3.5–5.0)
Alkaline Phosphatase: 93 U/L (ref 38–126)
Anion gap: 8 (ref 5–15)
BUN: 11 mg/dL (ref 8–23)
CO2: 24 mmol/L (ref 22–32)
Calcium: 9.6 mg/dL (ref 8.9–10.3)
Chloride: 104 mmol/L (ref 98–111)
Creatinine, Ser: 0.78 mg/dL (ref 0.44–1.00)
GFR calc Af Amer: >60 mL/min (ref >60)
GFR calc non Af Amer: >60 mL/min (ref >60)
Glucose, Bld: 128 mg/dL — ABNORMAL HIGH (ref 70–99)
Potassium: 3.6 mmol/L (ref 3.5–5.1)
Sodium: 136 mmol/L (ref 135–145)
Total Bilirubin: 0.6 mg/dL (ref 0.3–1.2)
Total Protein: 7.9 g/dL (ref 6.5–8.1)

## 2019-12-19 LAB — CBC WITH DIFFERENTIAL/PLATELET
Abs Immature Granulocytes: 0.07 10*3/uL (ref 0.00–0.07)
Basophils Absolute: 0 10*3/uL (ref 0.0–0.1)
Basophils Relative: 1 %
Eosinophils Absolute: 1.5 10*3/uL — ABNORMAL HIGH (ref 0.0–0.5)
Eosinophils Relative: 17 %
HCT: 34.1 % — ABNORMAL LOW (ref 36.0–46.0)
Hemoglobin: 9.9 g/dL — ABNORMAL LOW (ref 12.0–15.0)
Immature Granulocytes: 1 %
Lymphocytes Relative: 28 %
Lymphs Abs: 2.5 10*3/uL (ref 0.7–4.0)
MCH: 24.7 pg — ABNORMAL LOW (ref 26.0–34.0)
MCHC: 29 g/dL — ABNORMAL LOW (ref 30.0–36.0)
MCV: 85 fL (ref 80.0–100.0)
Monocytes Absolute: 0.7 10*3/uL (ref 0.1–1.0)
Monocytes Relative: 8 %
Neutro Abs: 4 10*3/uL (ref 1.7–7.7)
Neutrophils Relative %: 45 %
Platelets: 335 10*3/uL (ref 150–400)
RBC: 4.01 MIL/uL (ref 3.87–5.11)
RDW: 29.1 % — ABNORMAL HIGH (ref 11.5–15.5)
WBC: 8.8 10*3/uL (ref 4.0–10.5)
nRBC: 0 % (ref 0.0–0.2)

## 2019-12-19 MED ORDER — HEPARIN SOD (PORK) LOCK FLUSH 100 UNIT/ML IV SOLN
500.0000 [IU] | Freq: Once | INTRAVENOUS | Status: AC | PRN
  Administered 2019-12-19: 500 [IU]
  Filled 2019-12-19: qty 5

## 2019-12-19 MED ORDER — SODIUM CHLORIDE 0.9 % IV SOLN
425.7000 mg | Freq: Once | INTRAVENOUS | Status: AC
  Administered 2019-12-19: 430 mg via INTRAVENOUS
  Filled 2019-12-19: qty 43

## 2019-12-19 MED ORDER — PALONOSETRON HCL INJECTION 0.25 MG/5ML
0.2500 mg | Freq: Once | INTRAVENOUS | Status: AC
  Administered 2019-12-19: 0.25 mg via INTRAVENOUS
  Filled 2019-12-19: qty 5

## 2019-12-19 MED ORDER — SODIUM CHLORIDE 0.9 % IV SOLN
135.0000 mg/m2 | Freq: Once | INTRAVENOUS | Status: AC
  Administered 2019-12-19: 252 mg via INTRAVENOUS
  Filled 2019-12-19: qty 42

## 2019-12-19 MED ORDER — GABAPENTIN 100 MG PO CAPS: 100.0000 mg | ORAL_CAPSULE | Freq: Three times a day (TID) | ORAL | 0 refills | Status: DC

## 2019-12-19 MED ORDER — SODIUM CHLORIDE 0.9 % IV SOLN
Freq: Once | INTRAVENOUS | Status: AC
  Filled 2019-12-19: qty 250

## 2019-12-19 MED ORDER — PEGFILGRASTIM 6 MG/0.6ML ~~LOC~~ PSKT
6.0000 mg | PREFILLED_SYRINGE | Freq: Once | SUBCUTANEOUS | Status: AC
  Administered 2019-12-19: 6 mg via SUBCUTANEOUS
  Filled 2019-12-19: qty 0.6

## 2019-12-19 MED ORDER — SODIUM CHLORIDE 0.9 % IV SOLN
Freq: Once | INTRAVENOUS | Status: AC
  Filled 2019-12-19: qty 5

## 2019-12-19 MED ORDER — FAMOTIDINE IN NACL 20-0.9 MG/50ML-% IV SOLN
20.0000 mg | Freq: Once | INTRAVENOUS | Status: AC
  Administered 2019-12-19: 20 mg via INTRAVENOUS
  Filled 2019-12-19: qty 50

## 2019-12-19 NOTE — Progress Notes (Signed)
Patient is having feet pain and is wondering if it is neuropathy.  Occasionally feels off balance.

## 2019-12-19 NOTE — Progress Notes (Signed)
Hematology/Oncology follow up note [facility] Telephone:([phone]) [phone] Fax:([phone]   Patient Care Team: Sandra Eth, MD as PCP - General (Internal Medicine) Sandra Jacks, RN as Oncology Nurse Navigator  REFERRING PROVIDER: Felipa Eth, MD  CHIEF COMPLAINTS/REASON FOR VISIT:  Follow up for chemotherapy for endometrial cancer HISTORY OF PRESENTING ILLNESS:   Sandra Fischer is a  68 y.o.  female with PMH listed below was seen in consultation at the request of  Sandra Eth, MD  for evaluation of endometrial cancer Patient had TH/BSO sentinel lymph node biopsy by [facility] GYN oncology Sandra Fischer on 06/21/2019. Extensive medical records from [place] system review was performed by me.  Tumor size 9.3 cm, invading 99% of myometrium [3.55 out of 3.6 cm], positive right external iliac sentinel lymph nodes.  Negative washing.  No LVI, serosa is uninvolved. 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 pT1bpN25m  FIGO stage IIIC1 MSI intact HER 2 negative.   Patient was seen by Dr. BFransisca Connorson 07/06/2019 due to vaginal odor. Patient presents for establishing care and discussion for adjuvant chemotherapy radiation.  Per Dr. BBlake Fischer, [facility]  Portex 3 regimen was recommended given subgroup analysis showing benefit in the serous histology versus clinical trial. HER2 was ordered and pending results.   Patient has a history of SVT.  10/14/2018 2D echo reviewed moderately reduced LV function with LVEF 35 to 40% with diffuse hypokinesis.  Patient had Lexiscan Myoview on 01/19/2019.  LVEF 49%.  SPECT images revealed a mild fixed inferior wall defect, scar versus artifact.  No evidence of ischemia.  She follows up with cardiology Sandra Fischer 04/26/2019 with a plan to stay on her current medication. 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 She is married  and providing care for her mother.  # 10/20/2019 CT chest abdomen pelvis with contrast showed disease recurrence.  There are multiple newly enlarged retroperitoneal and right iliac lymph nodes the largest the left retroperitoneal node measuring 1.9 x 1.6 cm concerning for nodal metastasis disease.  Unchanged 4 mm groundglass pulmonary nodule of the left pulmonary apex.  This remains nonspecific. #Cardiomyopathy, LVEF35 to 40% Last seen by cardiology 04/26/2019.  Recommend patient to continue follow-up with cardiology.  # GERD  ER visit on 11/13/2019 due to chest pain which started after he ate at a restaurant and developed burning sensation in her chest.  No associated dyspnea. Patient had a CTA which showed no pulmonary embolism.  Patient symptoms got better after GI cocktail.  Was considered to be secondary to acid reflux.  Patient takes omeprazole 20 mg daily.   INTERVAL HISTORY BLarcenia Holadayis a 68y.o. female who has above history reviewed by me today presents for follow up visit for management of endometrial cancer. Problems and complaints are listed below: Patient is status post 2 cycles of carboplatin and Taxol. She reports intermittent numbness and tingling of toes and the bottom of her feet, to a milder degree of her fingertips.  She feels that sometimes she is off balance Mouth sore has resolved. Denies any fever, chills, nausea or vomiting. GERD, taking omeprazole 20 mg with symptom improved.  Review of Systems  Constitutional: Negative for appetite change, chills, fatigue and fever.  HENT:   Negative for hearing loss and voice change.   Eyes: Negative for eye problems.  Respiratory: Negative for chest tightness and cough.   Cardiovascular: Negative for chest pain.  Gastrointestinal:  Negative for abdominal distention, abdominal pain and blood in stool.       Acid reflux  Endocrine: Negative for hot flashes.  Genitourinary: Negative for difficulty urinating and frequency.    Musculoskeletal: Negative for arthralgias.  Skin: Negative for itching and rash.       Hair loss  Neurological: Positive for numbness. Negative for extremity weakness.  Hematological: Negative for adenopathy.  Psychiatric/Behavioral: Negative for confusion.    MEDICAL HISTORY:  Past Medical History:  Diagnosis Date  . Asthma   . Asthma   . Asthma exacerbation 08/22/2016  . Cancer ([place])   . Collagen vascular disease ([place])   . Diabetes mellitus without complication ([place])    History of Diabetes   . Environmental allergies   . Fibromyalgia   . High cholesterol   . Hypertension   . RA (rheumatoid arthritis) ([place])   . Thyroid disease     SURGICAL HISTORY: Past Surgical History:  Procedure Laterality Date  . CATARACT EXTRACTION    . CHOLECYSTECTOMY    . fibroids removed    . THYROIDECTOMY, PARTIAL    . tummy tuck      SOCIAL HISTORY: Social History   Socioeconomic History  . Marital status: Married    Spouse name: Not on file  . Number of children: Not on file  . Years of education: Not on file  . Highest education level: Not on file  Occupational History  . Not on file  Tobacco Use  . Smoking status: Never Smoker  . Smokeless tobacco: Never Used  Substance and Sexual Activity  . Alcohol use: No  . Drug use: No  . Sexual activity: Yes    Birth control/protection: Post-menopausal  Other Topics Concern  . Not on file  Social History Narrative  . Not on file   Social Determinants of Health   Financial Resource Strain:   . Difficulty of Paying Living Expenses: Not on file  Food Insecurity:   . Worried About Charity fundraiser in the Last Year: Not on file  . Ran Out of Food in the Last Year: Not on file  Transportation Needs:   . Lack of Transportation (Medical): Not on file  . Lack of Transportation (Non-Medical): Not on file  Physical Activity:   . Days of Exercise per Week: Not on file  . Minutes of Exercise per Session: Not on file  Stress:   .  Feeling of Stress : Not on file  Social Connections:   . Frequency of Communication with Friends and Family: Not on file  . Frequency of Social Gatherings with Friends and Family: Not on file  . Attends Religious Services: Not on file  . Active Member of Clubs or Organizations: Not on file  . Attends Archivist Meetings: Not on file  . Marital Status: Not on file  Intimate Partner Violence:   . Fear of Current or Ex-Partner: Not on file  . Emotionally Abused: Not on file  . Physically Abused: Not on file  . Sexually Abused: Not on file    FAMILY HISTORY: Family History  Problem Relation Age of Onset  . Diabetes Mother   . Hypertension Mother   . Hyperlipidemia Mother   . Dementia Mother   . Breast cancer Neg Hx   . Ovarian cancer Neg Hx   . Colon cancer Neg Hx     ALLERGIES:  is allergic to abatacept; codeine; latex; shellfish allergy; and penicillins.  MEDICATIONS:  Current Outpatient Medications  Medication Sig Dispense Refill  . Acetaminophen (TYLENOL EXTRA STRENGTH PO) Take 650 mg by mouth. 2 tabs twice a day PRN    . albuterol (ACCUNEB) 1.25 MG/3ML nebulizer solution Take 1 ampule by nebulization every 6 (six) hours as needed for wheezing.    Marland Kitchen albuterol (PROVENTIL HFA;VENTOLIN HFA) 108 (90 Base) MCG/ACT inhaler Inhale 2 puffs into the lungs every 6 (six) hours as needed for wheezing or shortness of breath. 1 Inhaler 0  . aspirin EC 81 MG tablet Take 81 mg by mouth daily.    Marland Kitchen azelastine (ASTELIN) 0.1 % nasal spray Place 2 sprays into both nostrils 2 (two) times daily. 30 mL 5  . budesonide-formoterol (SYMBICORT) 160-4.5 MCG/ACT inhaler Inhale 2 puffs into the lungs 2 (two) times daily.    . calcium carbonate (OS-CAL - DOSED IN MG OF ELEMENTAL CALCIUM) 1250 (500 Ca) MG tablet Take 1 tablet by mouth daily with breakfast.     . cetirizine (ZYRTEC) 10 MG tablet Take 10 mg by mouth daily.    . Cholecalciferol (D3 ADULT PO) Take 1 capsule by mouth daily.    Marland Kitchen  desloratadine (CLARINEX) 5 MG tablet Take 5 mg by mouth daily.    Marland Kitchen dexamethasone (DECADRON) 4 MG tablet Take 2 tablets by mouth once a day starting the day after chemotherapy. Continue for 3 days total. Take with food. 30 tablet 1  . docusate sodium (COLACE) 50 MG capsule Take 50 mg by mouth 2 (two) times daily.    Marland Kitchen EPINEPHrine 0.3 mg/0.3 mL IJ SOAJ injection Use as directed for severe allergic reaction. 2 Device 1  . ferrous sulfate 325 (65 FE) MG EC tablet Take 1 tablet (325 mg total) by mouth 2 (two) times daily with a meal. 60 tablet 1  . fluticasone (FLONASE) 50 MCG/ACT nasal spray Place 1 spray into both nostrils daily.     Marland Kitchen guaiFENesin-dextromethorphan (ROBITUSSIN DM) 100-10 MG/5ML syrup Take 5 mLs by mouth every 4 (four) hours as needed for cough. 118 mL 0  . ipratropium (ATROVENT) 0.02 % nebulizer solution Take 0.5 mg by nebulization 4 (four) times daily.    Marland Kitchen leflunomide (ARAVA) 20 MG tablet Take 20 mg by mouth daily.    Marland Kitchen levothyroxine (SYNTHROID, LEVOTHROID) 88 MCG tablet Take 88 mcg by mouth daily before breakfast.    . lidocaine-prilocaine (EMLA) cream Apply to affected area once (Patient taking differently: Apply 1 application topically as needed (to port). ) 30 g 3  . lisinopril (PRINIVIL,ZESTRIL) 40 MG tablet Take 40 mg by mouth daily.    . metoprolol succinate (TOPROL-XL) 50 MG 24 hr tablet Take 50 mg by mouth daily. Take with or immediately following a meal.    . montelukast (SINGULAIR) 10 MG tablet Take 10 mg by mouth at bedtime.    Marland Kitchen olopatadine (PATANOL) 0.1 % ophthalmic solution Place 1 drop into both eyes 2 (two) times daily. 5 mL 5  . omeprazole (PRILOSEC) 10 MG capsule Take 20 mg by mouth daily.    . pravastatin (PRAVACHOL) 40 MG tablet Take 40 mg by mouth daily.    . prochlorperazine (COMPAZINE) 10 MG tablet Take 1 tablet (10 mg total) by mouth every 6 (six) hours as needed (Nausea or vomiting). 30 tablet 1  . senna-docusate (SENOKOT-S) 8.6-50 MG tablet Take 1 tablet  by mouth at bedtime as needed for mild constipation.     Marland Kitchen tiotropium (SPIRIVA) 18 MCG inhalation capsule Place 18 mcg into inhaler and inhale daily.    Marland Kitchen  traMADol (ULTRAM) 50 MG tablet Take 100 mg by mouth as needed (for rheumatoid arthritis).    . verapamil (CALAN) 120 MG tablet Take 240 mg by mouth 2 (two) times daily.    Marland Kitchen gabapentin (NEURONTIN) 100 MG capsule Take 1 capsule (100 mg total) by mouth 3 (three) times daily. 90 capsule 0  . Prenatal Vit-Fe Fumarate-FA (MULTIVITAMIN-PRENATAL) 27-0.8 MG TABS tablet Take 1 tablet by mouth daily.      No current facility-administered medications for this visit.   Facility-Administered Medications Ordered in Other Visits  Medication Dose Route Frequency Provider Last Rate Last Admin  . CARBOplatin (PARAPLATIN) 430 mg in sodium chloride 0.9 % 250 mL chemo infusion  430 mg Intravenous Once Earlie Server, MD      . heparin lock flush 100 unit/mL  500 Units Intracatheter Once PRN Earlie Server, MD      . PACLitaxel (TAXOL) 252 mg in sodium chloride 0.9 % 250 mL chemo infusion (> 8m/m2)  135 mg/m2 (Treatment Plan Recorded) Intravenous Once YEarlie Server MD 97 mL/hr at 12/19/19 1148 252 mg at 12/19/19 1148  . pegfilgrastim (NEULASTA ONPRO KIT) injection 6 mg  6 mg Subcutaneous Once YEarlie Server MD         PHYSICAL EXAMINATION: ECOG PERFORMANCE STATUS: 1 - Symptomatic but completely ambulatory  Physical Exam Vitals and nursing note reviewed.  Constitutional:      General: She is not in acute distress. HENT:     Head: Normocephalic and atraumatic.  Eyes:     General: No scleral icterus.    Pupils: Pupils are equal, round, and reactive to light.  Cardiovascular:     Rate and Rhythm: Normal rate and regular rhythm.     Heart sounds: Normal heart sounds.  Pulmonary:     Effort: Pulmonary effort is normal. No respiratory distress.     Breath sounds: No wheezing.  Abdominal:     General: Bowel sounds are normal. There is no distension.     Palpations: Abdomen  is soft. There is no mass.     Tenderness: There is no abdominal tenderness.  Musculoskeletal:        General: No deformity. Normal range of motion.     Cervical back: Normal range of motion and neck supple.  Skin:    General: Skin is warm and dry.     Findings: No erythema or rash.  Neurological:     Mental Status: She is alert and oriented to person, place, and time.     Cranial Nerves: No cranial nerve deficit.     Coordination: Coordination normal.  Psychiatric:        Behavior: Behavior normal.        Thought Content: Thought content normal.     LABORATORY DATA:  I have reviewed the data as listed Lab Results  Component Value Date   WBC 8.8 12/19/2019   HGB 9.9 (L) 12/19/2019   HCT 34.1 (L) 12/19/2019   MCV 85.0 12/19/2019   PLT 335 12/19/2019   Recent Labs    11/28/19 0935 12/08/19 0921 12/19/19 0903  NA 137 136 136  K 3.4* 3.7 3.6  CL 105 103 104  CO2 '23 25 24  ' GLUCOSE 126* 131* 128*  BUN '15 12 11  ' CREATININE 0.72 0.73 0.78  CALCIUM 9.0 9.3 9.6  GFRNONAA >60 >60 >60  GFRAA >60 >60 >60  PROT 7.7 7.6 7.9  ALBUMIN 3.5 3.9 4.0  AST 14* 16 15  ALT '8 12 8  ' ALKPHOS  86 120 93  BILITOT 0.4 0.4 0.6   Iron/TIBC/Ferritin/ %Sat    Component Value Date/Time   IRON 31 11/07/2019 0906   TIBC 187 (L) 11/07/2019 0906   FERRITIN 204 11/07/2019 0906   IRONPCTSAT 17 11/07/2019 0906      RADIOGRAPHIC STUDIES: I have personally reviewed the radiological images as listed and agreed with the findings in the report. DG Chest 2 View  Result Date: 11/13/2019 CLINICAL DATA:  Chest pain EXAM: CHEST - 2 VIEW COMPARISON:  11/26/2018 FINDINGS: The heart size and mediastinal contours are within normal limits. Both lungs are clear. The visualized skeletal structures are unremarkable. There is a right chest wall power-injectable Port-A-Cath with tip at the cavoatrial junction via a right internal jugular vein approach. IMPRESSION: No active cardiopulmonary disease. Electronically  Signed   By: Ulyses Jarred M.D.   On: 11/13/2019 05:28   CT CHEST W CONTRAST  Result Date: 10/20/2019 CLINICAL DATA:  Follow-up endometrial cancer, status post hysterectomy, no adjuvant therapy EXAM: CT CHEST, ABDOMEN, AND PELVIS WITH CONTRAST TECHNIQUE: Multidetector CT imaging of the chest, abdomen and pelvis was performed following the standard protocol during bolus administration of intravenous contrast. CONTRAST:  156m OMNIPAQUE IOHEXOL 300 MG/ML SOLN, additional oral enteric contrast COMPARISON:  CT chest abdomen pelvis, 06/13/2019, CT chest angiogram, 10/14/2018 FINDINGS: CT CHEST FINDINGS Cardiovascular: Right chest port catheter. Aortic atherosclerosis. Normal heart size. No pericardial effusion. Mediastinum/Nodes: No enlarged mediastinal, hilar, or axillary lymph nodes. Thyroid gland, trachea, and esophagus demonstrate no significant findings. Lungs/Pleura: Unchanged 4 mm ground-glass pulmonary nodule of the left pulmonary apex (series 3, image 24). No pleural effusion or pneumothorax. Musculoskeletal: No chest wall mass or suspicious bone lesions identified. CT ABDOMEN PELVIS FINDINGS Hepatobiliary: No focal liver abnormality is seen. Status post cholecystectomy. No biliary dilatation. Pancreas: Unremarkable. No pancreatic ductal dilatation or surrounding inflammatory changes. Spleen: Normal in size without significant abnormality. Adrenals/Urinary Tract: Adrenal glands are unremarkable. Kidneys are normal, without renal calculi, solid lesion, or hydronephrosis. Bladder is unremarkable. Stomach/Bowel: Stomach is within normal limits. Appendix appears normal. No evidence of bowel wall thickening, distention, or inflammatory changes. Descending colonic diverticula. Vascular/Lymphatic: Aortic atherosclerosis. There are multiple newly enlarged retroperitoneal and right iliac lymph nodes, the largest left retroperitoneal node measuring 1.9 x 1.6 cm (series 2, image 71). Reproductive: Status post  hysterectomy. Other: No abdominal wall hernia or abnormality. No abdominopelvic ascites. Musculoskeletal: No acute or significant osseous findings. IMPRESSION: 1.  Status post interval hysterectomy. 2. There are multiple newly enlarged retroperitoneal and right iliac lymph nodes, the largest left retroperitoneal node measuring 1.9 x 1.6 cm (series 2, image 71), concerning for nodal metastatic disease. 3. Unchanged 4 mm ground-glass pulmonary nodule of the left pulmonary apex (series 3, image 24). This remains nonspecific although is again an unlikely manifestation of pulmonary metastatic disease. 4. Other chronic, incidental, and postoperative findings as above. Aortic Atherosclerosis (ICD10-I70.0). Electronically Signed   By: AEddie CandleM.D.   On: 10/20/2019 10:25   CT Angio Chest PE W and/or Wo Contrast  Result Date: 11/13/2019 CLINICAL DATA:  Positive D-dimer. History of endometrial cancer. Evaluate for pulmonary embolism. EXAM: CT ANGIOGRAPHY CHEST WITH CONTRAST TECHNIQUE: Multidetector CT imaging of the chest was performed using the standard protocol during bolus administration of intravenous contrast. Multiplanar CT image reconstructions and MIPs were obtained to evaluate the vascular anatomy. CONTRAST:  61 mL OMNIPAQUE IOHEXOL 350 MG/ML SOLN COMPARISON:  Chest CT-10/14/2018; CT chest, abdomen and pelvis-10/20/2019; 06/13/2019 FINDINGS: Vascular Findings: There is adequate opacification  of the pulmonary arterial system with the main pulmonary artery measuring 429 Hounsfield units. There are no discrete filling defects within the pulmonary arterial tree to suggest pulmonary embolism. Normal caliber the main pulmonary artery. Cardiomegaly. Calcifications about the aortic root. No pericardial effusion though a small amount of fluid is seen within the pericardial recess. No evidence of thoracic aortic aneurysm or dissection on this nongated examination. Bovine configuration of the aortic arch. The branch  vessels of the aortic arch appear widely patent throughout their imaged courses. Right jugular approach port a catheter tip terminates within the superior cavoatrial junction. Review of the MIP images confirms the above findings. ---------------------------------------------------------------------------------- Nonvascular Findings: Mediastinum/Lymph Nodes: No bulky mediastinal, hilar axillary lymphadenopathy. Lungs/Pleura: There is a minimal amount of dependent subpleural ground-glass atelectasis. No discrete focal airspace opacities. No pleural effusion or pneumothorax. The central pulmonary airways appear patent. No new pulmonary nodules. Punctate (approximately 4 mm) nodule within the left lung apex (image 23, series 6) is unchanged compared to the 06/13/2019 examination, not definitely seen on the 09/2018 exam. Upper abdomen: Limited evaluation of the upper abdomen demonstrates mild nodularity hepatic contour as could be seen in the setting of early cirrhotic change. No acute or aggressive osseous abnormalities. Moderate DDD of C5-C6 with disc space height loss, endplate irregularity and sclerosis. Musculoskeletal: No acute or aggressive osseous abnormalities. Moderate DDD of C5-C6 with disc space height loss, endplate irregularity and sclerosis. Regional soft tissues appear normal. IMPRESSION: 1. No acute cardiopulmonary disease. Specifically, no evidence of pulmonary embolism. 2. Indeterminate punctate (approximately 4 mm) left apical pulmonary nodule is unchanged compared to the 05/2019 examination of uncertain though doubtful clinical concern. Electronically Signed   By: Sandi Mariscal M.D.   On: 11/13/2019 08:14   CT ABDOMEN PELVIS W CONTRAST  Result Date: 10/20/2019 CLINICAL DATA:  Follow-up endometrial cancer, status post hysterectomy, no adjuvant therapy EXAM: CT CHEST, ABDOMEN, AND PELVIS WITH CONTRAST TECHNIQUE: Multidetector CT imaging of the chest, abdomen and pelvis was performed following the  standard protocol during bolus administration of intravenous contrast. CONTRAST:  154m OMNIPAQUE IOHEXOL 300 MG/ML SOLN, additional oral enteric contrast COMPARISON:  CT chest abdomen pelvis, 06/13/2019, CT chest angiogram, 10/14/2018 FINDINGS: CT CHEST FINDINGS Cardiovascular: Right chest port catheter. Aortic atherosclerosis. Normal heart size. No pericardial effusion. Mediastinum/Nodes: No enlarged mediastinal, hilar, or axillary lymph nodes. Thyroid gland, trachea, and esophagus demonstrate no significant findings. Lungs/Pleura: Unchanged 4 mm ground-glass pulmonary nodule of the left pulmonary apex (series 3, image 24). No pleural effusion or pneumothorax. Musculoskeletal: No chest wall mass or suspicious bone lesions identified. CT ABDOMEN PELVIS FINDINGS Hepatobiliary: No focal liver abnormality is seen. Status post cholecystectomy. No biliary dilatation. Pancreas: Unremarkable. No pancreatic ductal dilatation or surrounding inflammatory changes. Spleen: Normal in size without significant abnormality. Adrenals/Urinary Tract: Adrenal glands are unremarkable. Kidneys are normal, without renal calculi, solid lesion, or hydronephrosis. Bladder is unremarkable. Stomach/Bowel: Stomach is within normal limits. Appendix appears normal. No evidence of bowel wall thickening, distention, or inflammatory changes. Descending colonic diverticula. Vascular/Lymphatic: Aortic atherosclerosis. There are multiple newly enlarged retroperitoneal and right iliac lymph nodes, the largest left retroperitoneal node measuring 1.9 x 1.6 cm (series 2, image 71). Reproductive: Status post hysterectomy. Other: No abdominal wall hernia or abnormality. No abdominopelvic ascites. Musculoskeletal: No acute or significant osseous findings. IMPRESSION: 1.  Status post interval hysterectomy. 2. There are multiple newly enlarged retroperitoneal and right iliac lymph nodes, the largest left retroperitoneal node measuring 1.9 x 1.6 cm (series 2,  image 71), concerning  for nodal metastatic disease. 3. Unchanged 4 mm ground-glass pulmonary nodule of the left pulmonary apex (series 3, image 24). This remains nonspecific although is again an unlikely manifestation of pulmonary metastatic disease. 4. Other chronic, incidental, and postoperative findings as above. Aortic Atherosclerosis (ICD10-I70.0). Electronically Signed   By: Eddie Candle M.D.   On: 10/20/2019 10:25    ASSESSMENT & PLAN:  1. Endometrial cancer ([place])   2. Iron deficiency anemia due to chronic blood loss   3. Gastroesophageal reflux disease, unspecified whether esophagitis present   4. Encounter for antineoplastic chemotherapy   5. Rheumatoid arthritis, involving unspecified site, unspecified whether rheumatoid factor present ([place])    #Recurrent endometrial cancer Status post 2 cycles of adjuvant carboplatin and Taxol. Labs are reviewed and discussed with patient. Counts are acceptable to proceed with cycle 3 carboplatin AUC 4.5 and Taxol.  #Neuropathy, grade 1-2, secondary to chemotherapy. Recommend patient to start gabapentin 100 mg 3 times daily.  She will start with 100 mg daily, if doing well further titrates to 3 times daily.  Reduce Taxol to 135 mg/m.   #Mucositis, resolved.   #anemia, multifactorial.  Patient hemoglobin has improved.    Anemia has a combination of chemotherapy/CKD/functional iron deficiency. IV iron was previously discussed and she declines. Continue oral iron supplementation.  #GERD, continue omeprazole 20 mg daily. #Rheumatoid arthritis,  I recommend patient to discuss with rheumatologist and to be temporarily taken off ARAVA since her rheumatology symptoms has been stable and well controlled while on chemo.  Return of visit: 3 weeks for cycle 4 treatments. Earlie Server, MD, PhD Hematology Oncology [facility] at [facility] Pager- [phone] 12/19/2019

## 2020-01-02 ENCOUNTER — Inpatient Hospital Stay: Payer: Medicare Other | Attending: Oncology

## 2020-01-02 ENCOUNTER — Other Ambulatory Visit: Payer: Self-pay

## 2020-01-02 ENCOUNTER — Telehealth: Payer: Self-pay | Admitting: *Deleted

## 2020-01-02 DIAGNOSIS — E079 Disorder of thyroid, unspecified: Secondary | ICD-10-CM | POA: Diagnosis not present

## 2020-01-02 DIAGNOSIS — K219 Gastro-esophageal reflux disease without esophagitis: Secondary | ICD-10-CM | POA: Insufficient documentation

## 2020-01-02 DIAGNOSIS — C541 Malignant neoplasm of endometrium: Secondary | ICD-10-CM | POA: Insufficient documentation

## 2020-01-02 DIAGNOSIS — I429 Cardiomyopathy, unspecified: Secondary | ICD-10-CM | POA: Diagnosis not present

## 2020-01-02 DIAGNOSIS — Z5189 Encounter for other specified aftercare: Secondary | ICD-10-CM

## 2020-01-02 DIAGNOSIS — D509 Iron deficiency anemia, unspecified: Secondary | ICD-10-CM | POA: Insufficient documentation

## 2020-01-02 DIAGNOSIS — Z01812 Encounter for preprocedural laboratory examination: Secondary | ICD-10-CM

## 2020-01-02 DIAGNOSIS — G620 Drug-induced polyneuropathy: Secondary | ICD-10-CM | POA: Diagnosis not present

## 2020-01-02 DIAGNOSIS — Z5111 Encounter for antineoplastic chemotherapy: Secondary | ICD-10-CM | POA: Diagnosis present

## 2020-01-02 LAB — CBC WITH DIFFERENTIAL/PLATELET
Abs Immature Granulocytes: 0.11 10*3/uL — ABNORMAL HIGH (ref 0.00–0.07)
Basophils Absolute: 0 10*3/uL (ref 0.0–0.1)
Basophils Relative: 0 %
Eosinophils Absolute: 0.2 10*3/uL (ref 0.0–0.5)
Eosinophils Relative: 2 %
HCT: 33.3 % — ABNORMAL LOW (ref 36.0–46.0)
Hemoglobin: 9.8 g/dL — ABNORMAL LOW (ref 12.0–15.0)
Immature Granulocytes: 1 %
Lymphocytes Relative: 25 %
Lymphs Abs: 2.2 10*3/uL (ref 0.7–4.0)
MCH: 26.1 pg (ref 26.0–34.0)
MCHC: 29.4 g/dL — ABNORMAL LOW (ref 30.0–36.0)
MCV: 88.6 fL (ref 80.0–100.0)
Monocytes Absolute: 0.7 10*3/uL (ref 0.1–1.0)
Monocytes Relative: 8 %
Neutro Abs: 5.7 10*3/uL (ref 1.7–7.7)
Neutrophils Relative %: 64 %
Platelets: 250 10*3/uL (ref 150–400)
RBC: 3.76 MIL/uL — ABNORMAL LOW (ref 3.87–5.11)
WBC: 8.9 10*3/uL (ref 4.0–10.5)
nRBC: 0 % (ref 0.0–0.2)

## 2020-01-02 NOTE — Telephone Encounter (Signed)
Patient called reporting that she has broken her tooth and needs to have it fixed, her next chemotherapy is on he 11th and dentist wanted to be sure it is OK to repair.. She also reports that she is having eye problems, seeing red circles. Please advise

## 2020-01-02 NOTE — Telephone Encounter (Signed)
Patient coming in this afternoon for lab check as she is scheduled dental procedure tomorrow, She will also contact her PCP for Urgent Ophathalmology referral

## 2020-01-02 NOTE — Telephone Encounter (Signed)
Please arrange her to have cbc done if cbc is stable, she can have dental procedure.  Acute vision problem:  please advise pt to contact her pcp and be referred to ophthalmologist urgently.

## 2020-01-03 ENCOUNTER — Telehealth: Payer: Self-pay | Admitting: *Deleted

## 2020-01-03 NOTE — Telephone Encounter (Signed)
Patient needs lab results ASAP. She has an appointment with dentist for tooth extraction and needs results to proceed.

## 2020-01-03 NOTE — Telephone Encounter (Signed)
Per Dr. [name] CBC is stable and patient may proceed with extraction. Patient has been notified. Also spoke with Lonn Georgia at [facility] and I have faxed lab results to their office.

## 2020-01-06 ENCOUNTER — Other Ambulatory Visit: Payer: Self-pay

## 2020-01-09 ENCOUNTER — Encounter: Payer: Self-pay | Admitting: Oncology

## 2020-01-09 ENCOUNTER — Inpatient Hospital Stay: Payer: Medicare Other

## 2020-01-09 ENCOUNTER — Inpatient Hospital Stay (HOSPITAL_BASED_OUTPATIENT_CLINIC_OR_DEPARTMENT_OTHER): Payer: Medicare Other | Admitting: Oncology

## 2020-01-09 ENCOUNTER — Other Ambulatory Visit: Payer: Self-pay

## 2020-01-09 VITALS — BP 136/82 | HR 77 | Temp 98.4°F | Resp 18 | Wt 181.5 lb

## 2020-01-09 DIAGNOSIS — Z01812 Encounter for preprocedural laboratory examination: Secondary | ICD-10-CM

## 2020-01-09 DIAGNOSIS — C541 Malignant neoplasm of endometrium: Secondary | ICD-10-CM | POA: Diagnosis not present

## 2020-01-09 DIAGNOSIS — D500 Iron deficiency anemia secondary to blood loss (chronic): Secondary | ICD-10-CM

## 2020-01-09 DIAGNOSIS — G620 Drug-induced polyneuropathy: Secondary | ICD-10-CM

## 2020-01-09 DIAGNOSIS — Z5111 Encounter for antineoplastic chemotherapy: Secondary | ICD-10-CM

## 2020-01-09 DIAGNOSIS — T451X5A Adverse effect of antineoplastic and immunosuppressive drugs, initial encounter: Secondary | ICD-10-CM

## 2020-01-09 LAB — COMPREHENSIVE METABOLIC PANEL
ALT: 7 U/L (ref 0–44)
AST: 17 U/L (ref 15–41)
Albumin: 3.9 g/dL (ref 3.5–5.0)
Alkaline Phosphatase: 113 U/L (ref 38–126)
Anion gap: 11 (ref 5–15)
BUN: 9 mg/dL (ref 8–23)
CO2: 23 mmol/L (ref 22–32)
Calcium: 9.6 mg/dL (ref 8.9–10.3)
Chloride: 106 mmol/L (ref 98–111)
Creatinine, Ser: 0.66 mg/dL (ref 0.44–1.00)
GFR calc Af Amer: >60 mL/min (ref >60)
GFR calc non Af Amer: >60 mL/min (ref >60)
Glucose, Bld: 127 mg/dL — ABNORMAL HIGH (ref 70–99)
Potassium: 3.7 mmol/L (ref 3.5–5.1)
Sodium: 140 mmol/L (ref 135–145)
Total Bilirubin: 0.5 mg/dL (ref 0.3–1.2)
Total Protein: 7.5 g/dL (ref 6.5–8.1)

## 2020-01-09 LAB — CBC WITH DIFFERENTIAL/PLATELET
Abs Immature Granulocytes: 0.04 10*3/uL (ref 0.00–0.07)
Basophils Absolute: 0 10*3/uL (ref 0.0–0.1)
Basophils Relative: 0 %
Eosinophils Absolute: 0.9 10*3/uL — ABNORMAL HIGH (ref 0.0–0.5)
Eosinophils Relative: 13 %
HCT: 32.4 % — ABNORMAL LOW (ref 36.0–46.0)
Hemoglobin: 9.7 g/dL — ABNORMAL LOW (ref 12.0–15.0)
Immature Granulocytes: 1 %
Lymphocytes Relative: 31 %
Lymphs Abs: 2.2 10*3/uL (ref 0.7–4.0)
MCH: 26.8 pg (ref 26.0–34.0)
MCHC: 29.9 g/dL — ABNORMAL LOW (ref 30.0–36.0)
MCV: 89.5 fL (ref 80.0–100.0)
Monocytes Absolute: 0.6 10*3/uL (ref 0.1–1.0)
Monocytes Relative: 8 %
Neutro Abs: 3.4 10*3/uL (ref 1.7–7.7)
Neutrophils Relative %: 47 %
Platelets: 255 10*3/uL (ref 150–400)
RBC: 3.62 MIL/uL — ABNORMAL LOW (ref 3.87–5.11)
RDW: 27.4 % — ABNORMAL HIGH (ref 11.5–15.5)
WBC: 7 10*3/uL (ref 4.0–10.5)
nRBC: 0 % (ref 0.0–0.2)

## 2020-01-09 MED ORDER — PEGFILGRASTIM 6 MG/0.6ML ~~LOC~~ PSKT
6.0000 mg | PREFILLED_SYRINGE | Freq: Once | SUBCUTANEOUS | Status: AC
  Administered 2020-01-09: 15:00:00 6 mg via SUBCUTANEOUS
  Filled 2020-01-09: qty 0.6

## 2020-01-09 MED ORDER — PALONOSETRON HCL INJECTION 0.25 MG/5ML
0.2500 mg | Freq: Once | INTRAVENOUS | Status: AC
  Administered 2020-01-09: 10:00:00 0.25 mg via INTRAVENOUS
  Filled 2020-01-09: qty 5

## 2020-01-09 MED ORDER — SODIUM CHLORIDE 0.9 % IV SOLN
425.7000 mg | Freq: Once | INTRAVENOUS | Status: AC
  Administered 2020-01-09: 14:00:00 430 mg via INTRAVENOUS
  Filled 2020-01-09: qty 43

## 2020-01-09 MED ORDER — SODIUM CHLORIDE 0.9% FLUSH
10.0000 mL | INTRAVENOUS | Status: DC | PRN
  Administered 2020-01-09: 09:00:00 10 mL via INTRAVENOUS
  Filled 2020-01-09: qty 10

## 2020-01-09 MED ORDER — HEPARIN SOD (PORK) LOCK FLUSH 100 UNIT/ML IV SOLN
500.0000 [IU] | Freq: Once | INTRAVENOUS | Status: AC
  Administered 2020-01-09: 15:00:00 500 [IU] via INTRAVENOUS
  Filled 2020-01-09: qty 5

## 2020-01-09 MED ORDER — SODIUM CHLORIDE 0.9 % IV SOLN
Freq: Once | INTRAVENOUS | Status: AC
  Filled 2020-01-09: qty 250

## 2020-01-09 MED ORDER — FAMOTIDINE IN NACL 20-0.9 MG/50ML-% IV SOLN
20.0000 mg | Freq: Once | INTRAVENOUS | Status: AC
  Administered 2020-01-09: 20 mg via INTRAVENOUS
  Filled 2020-01-09: qty 50

## 2020-01-09 MED ORDER — SODIUM CHLORIDE 0.9 % IV SOLN
135.0000 mg/m2 | Freq: Once | INTRAVENOUS | Status: AC
  Administered 2020-01-09: 11:00:00 252 mg via INTRAVENOUS
  Filled 2020-01-09: qty 42

## 2020-01-09 MED ORDER — SODIUM CHLORIDE 0.9 % IV SOLN
Freq: Once | INTRAVENOUS | Status: AC
  Filled 2020-01-09: qty 5

## 2020-01-09 NOTE — Progress Notes (Signed)
Patient has an appointment with oral surgeon on 01/17/20 to discuss tooth removal.  Also has an appointment with specialists to discuss left eye cataracts.   Having occasional shocking sensation in hands and other parts of her body.

## 2020-01-09 NOTE — Progress Notes (Signed)
Hematology/Oncology follow up note [facility] Telephone:([phone] Fax:([phone]) [phone]   Patient Care Team: Sandra Eth, MD as PCP - General (Internal Medicine) Sandra Jacks, RN as Oncology Nurse Navigator  REFERRING PROVIDER: Felipa Eth, MD  CHIEF COMPLAINTS/REASON FOR VISIT:  Follow up for chemotherapy for endometrial cancer HISTORY OF PRESENTING ILLNESS:   Sandra Fischer is a  69 y.o.  female with PMH listed below was seen in consultation at the request of  Sandra Eth, MD  for evaluation of endometrial cancer Patient had TH/BSO sentinel lymph node biopsy by Sandra Fischer on 06/21/2019. Extensive medical records from [facility] system review was performed by me.  Tumor size 9.3 cm, invading 99% of myometrium [3.55 out of 3.6 cm], positive right external iliac sentinel lymph nodes.  Negative washing.  No LVI, serosa is uninvolved. 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 pT1bpN42m  FIGO stage IIIC1 MSI intact HER 2 negative.   Patient was seen by Dr. BFransisca Connorson 07/06/2019 due to vaginal odor. Patient presents for establishing care and discussion for adjuvant chemotherapy radiation.  Per Dr. BBlake Fischer, [facility]  Portex 3 regimen was recommended given subgroup analysis showing benefit in the serous histology versus clinical trial. HER2 was ordered and pending results.   Patient has a history of SVT.  10/14/2018 2D echo reviewed moderately reduced LV function with LVEF 35 to 40% with diffuse hypokinesis.  Patient had Lexiscan Myoview on 01/19/2019.  LVEF 49%.  SPECT images revealed a mild fixed inferior wall defect, scar versus artifact.  No evidence of ischemia.  She follows up with cardiology SandraParaschos 04/26/2019 with a plan to stay on her current medication. 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 She is married  and providing care for her mother.  # 10/20/2019 CT chest abdomen pelvis with contrast showed disease recurrence.  There are multiple newly enlarged retroperitoneal and right iliac lymph nodes the largest the left retroperitoneal node measuring 1.9 x 1.6 cm concerning for nodal metastasis disease.  Unchanged 4 mm groundglass pulmonary nodule of the left pulmonary apex.  This remains nonspecific. #Cardiomyopathy, LVEF35 to 40% Last seen by cardiology 04/26/2019.  Recommend patient to continue follow-up with cardiology.  # GERD  ER visit on 11/13/2019 due to chest pain which started after he ate at a restaurant and developed burning sensation in her chest.  No associated dyspnea. Patient had a CTA which showed no pulmonary embolism.  Patient symptoms got better after GI cocktail.  Was considered to be secondary to acid reflux.  Patient takes omeprazole 20 mg daily.   INTERVAL HISTORY BNaomy Eshamis a 69y.o. female who has above history reviewed by me today presents for follow up visit for management of endometrial cancer. Problems and complaints are listed below: Patient is status post 3 cycles of carboplatin and Taxol. Denies any mouth sore, fever, chills, nausea vomiting. GERD, she takes omeprazole 20 mg with good symptom control. She broke a tooth and will need to have oral surgery evaluation. Neuropathy, takes gabapentin 100 mg 3 times daily. Review of Systems  Constitutional: Negative for appetite change, chills, fatigue and fever.  HENT:   Negative for hearing loss and voice change.   Eyes: Negative for eye problems.  Respiratory: Negative for chest tightness and cough.   Cardiovascular: Negative for chest pain.  Gastrointestinal: Negative for abdominal distention, abdominal pain and blood in stool.  Acid reflux  Endocrine: Negative for hot flashes.  Genitourinary: Negative for difficulty urinating and frequency.   Musculoskeletal: Negative for arthralgias.  Skin: Negative for  itching and rash.       Hair loss  Neurological: Positive for numbness. Negative for extremity weakness.  Hematological: Negative for adenopathy.  Psychiatric/Behavioral: Negative for confusion.    MEDICAL HISTORY:  Past Medical History:  Diagnosis Date  . Asthma   . Asthma   . Asthma exacerbation 08/22/2016  . Cancer ([place])   . Collagen vascular disease ([place])   . Diabetes mellitus without complication ([place])    History of Diabetes   . Environmental allergies   . Fibromyalgia   . High cholesterol   . Hypertension   . RA (rheumatoid arthritis) ([place])   . Thyroid disease     SURGICAL HISTORY: Past Surgical History:  Procedure Laterality Date  . CATARACT EXTRACTION    . CHOLECYSTECTOMY    . fibroids removed    . THYROIDECTOMY, PARTIAL    . tummy tuck      SOCIAL HISTORY: Social History   Socioeconomic History  . Marital status: Married    Spouse name: Not on file  . Number of children: Not on file  . Years of education: Not on file  . Highest education level: Not on file  Occupational History  . Not on file  Tobacco Use  . Smoking status: Never Smoker  . Smokeless tobacco: Never Used  Substance and Sexual Activity  . Alcohol use: No  . Drug use: No  . Sexual activity: Yes    Birth control/protection: Post-menopausal  Other Topics Concern  . Not on file  Social History Narrative  . Not on file   Social Determinants of Health   Financial Resource Strain:   . Difficulty of Paying Living Expenses: Not on file  Food Insecurity:   . Worried About Charity fundraiser in the Last Year: Not on file  . Ran Out of Food in the Last Year: Not on file  Transportation Needs:   . Lack of Transportation (Medical): Not on file  . Lack of Transportation (Non-Medical): Not on file  Physical Activity:   . Days of Exercise per Week: Not on file  . Minutes of Exercise per Session: Not on file  Stress:   . Feeling of Stress : Not on file  Social Connections:   . Frequency  of Communication with Friends and Family: Not on file  . Frequency of Social Gatherings with Friends and Family: Not on file  . Attends Religious Services: Not on file  . Active Member of Clubs or Organizations: Not on file  . Attends Archivist Meetings: Not on file  . Marital Status: Not on file  Intimate Partner Violence:   . Fear of Current or Ex-Partner: Not on file  . Emotionally Abused: Not on file  . Physically Abused: Not on file  . Sexually Abused: Not on file    FAMILY HISTORY: Family History  Problem Relation Age of Onset  . Diabetes Mother   . Hypertension Mother   . Hyperlipidemia Mother   . Dementia Mother   . Breast cancer Neg Hx   . Ovarian cancer Neg Hx   . Colon cancer Neg Hx     ALLERGIES:  is allergic to abatacept; codeine; latex; shellfish allergy; and penicillins.  MEDICATIONS:  Current Outpatient Medications  Medication Sig Dispense Refill  . Acetaminophen (TYLENOL EXTRA STRENGTH PO) Take 650 mg by mouth.  2 tabs twice a day PRN    . albuterol (ACCUNEB) 1.25 MG/3ML nebulizer solution Take 1 ampule by nebulization every 6 (six) hours as needed for wheezing.    Marland Kitchen albuterol (PROVENTIL HFA;VENTOLIN HFA) 108 (90 Base) MCG/ACT inhaler Inhale 2 puffs into the lungs every 6 (six) hours as needed for wheezing or shortness of breath. 1 Inhaler 0  . aspirin EC 81 MG tablet Take 81 mg by mouth daily.    Marland Kitchen azelastine (ASTELIN) 0.1 % nasal spray Place 2 sprays into both nostrils 2 (two) times daily. 30 mL 5  . budesonide-formoterol (SYMBICORT) 160-4.5 MCG/ACT inhaler Inhale 2 puffs into the lungs 2 (two) times daily.    . calcium carbonate (OS-CAL - DOSED IN MG OF ELEMENTAL CALCIUM) 1250 (500 Ca) MG tablet Take 1 tablet by mouth daily with breakfast.     . cetirizine (ZYRTEC) 10 MG tablet Take 10 mg by mouth daily.    . Cholecalciferol (D3 ADULT PO) Take 1 capsule by mouth daily.    Marland Kitchen desloratadine (CLARINEX) 5 MG tablet Take 5 mg by mouth daily.    Marland Kitchen  dexamethasone (DECADRON) 4 MG tablet Take 2 tablets by mouth once a day starting the day after chemotherapy. Continue for 3 days total. Take with food. 30 tablet 1  . docusate sodium (COLACE) 50 MG capsule Take 50 mg by mouth 2 (two) times daily.    Marland Kitchen EPINEPHrine 0.3 mg/0.3 mL IJ SOAJ injection Use as directed for severe allergic reaction. 2 Device 1  . ferrous sulfate 325 (65 FE) MG EC tablet Take 1 tablet (325 mg total) by mouth 2 (two) times daily with a meal. 60 tablet 1  . fluticasone (FLONASE) 50 MCG/ACT nasal spray Place 1 spray into both nostrils daily.     Marland Kitchen gabapentin (NEURONTIN) 100 MG capsule Take 1 capsule (100 mg total) by mouth 3 (three) times daily. 90 capsule 0  . guaiFENesin-dextromethorphan (ROBITUSSIN DM) 100-10 MG/5ML syrup Take 5 mLs by mouth every 4 (four) hours as needed for cough. 118 mL 0  . ipratropium (ATROVENT) 0.02 % nebulizer solution Take 0.5 mg by nebulization 4 (four) times daily.    Marland Kitchen leflunomide (ARAVA) 20 MG tablet Take 20 mg by mouth daily.    Marland Kitchen levothyroxine (SYNTHROID, LEVOTHROID) 88 MCG tablet Take 88 mcg by mouth daily before breakfast.    . lidocaine-prilocaine (EMLA) cream Apply to affected area once (Patient taking differently: Apply 1 application topically as needed (to port). ) 30 g 3  . lisinopril (PRINIVIL,ZESTRIL) 40 MG tablet Take 40 mg by mouth daily.    . metoprolol succinate (TOPROL-XL) 50 MG 24 hr tablet Take 50 mg by mouth daily. Take with or immediately following a meal.    . montelukast (SINGULAIR) 10 MG tablet Take 10 mg by mouth at bedtime.    Marland Kitchen omeprazole (PRILOSEC) 10 MG capsule Take 20 mg by mouth daily.    . pravastatin (PRAVACHOL) 40 MG tablet Take 40 mg by mouth daily.    . Prenatal Vit-Fe Fumarate-FA (MULTIVITAMIN-PRENATAL) 27-0.8 MG TABS tablet Take 1 tablet by mouth daily.     . prochlorperazine (COMPAZINE) 10 MG tablet Take 1 tablet (10 mg total) by mouth every 6 (six) hours as needed (Nausea or vomiting). 30 tablet 1  .  senna-docusate (SENOKOT-S) 8.6-50 MG tablet Take 1 tablet by mouth at bedtime as needed for mild constipation.     Marland Kitchen tiotropium (SPIRIVA) 18 MCG inhalation capsule Place 18 mcg into inhaler and inhale daily.    Marland Kitchen  traMADol (ULTRAM) 50 MG tablet Take 100 mg by mouth as needed (for rheumatoid arthritis).    . verapamil (CALAN) 120 MG tablet Take 240 mg by mouth 2 (two) times daily.    Marland Kitchen olopatadine (PATANOL) 0.1 % ophthalmic solution Place 1 drop into both eyes 2 (two) times daily. (Patient not taking: Reported on 01/09/2020) 5 mL 5   No current facility-administered medications for this visit.   Facility-Administered Medications Ordered in Other Visits  Medication Dose Route Frequency Provider Last Rate Last Admin  . sodium chloride flush (NS) 0.9 % injection 10 mL  10 mL Intravenous PRN Earlie Server, MD   10 mL at 01/09/20 0830     PHYSICAL EXAMINATION: ECOG PERFORMANCE STATUS: 1 - Symptomatic but completely ambulatory  Physical Exam Vitals and nursing note reviewed.  Constitutional:      General: She is not in acute distress. HENT:     Head: Normocephalic and atraumatic.  Eyes:     General: No scleral icterus.    Pupils: Pupils are equal, round, and reactive to light.  Cardiovascular:     Rate and Rhythm: Normal rate and regular rhythm.     Heart sounds: Normal heart sounds.  Pulmonary:     Effort: Pulmonary effort is normal. No respiratory distress.     Breath sounds: No wheezing.  Abdominal:     General: Bowel sounds are normal. There is no distension.     Palpations: Abdomen is soft. There is no mass.     Tenderness: There is no abdominal tenderness.  Musculoskeletal:        General: No deformity. Normal range of motion.     Cervical back: Normal range of motion and neck supple.  Skin:    General: Skin is warm and dry.     Findings: No erythema or rash.  Neurological:     Mental Status: She is alert and oriented to person, place, and time.     Cranial Nerves: No cranial  nerve deficit.     Coordination: Coordination normal.  Psychiatric:        Behavior: Behavior normal.        Thought Content: Thought content normal.     LABORATORY DATA:  I have reviewed the data as listed Lab Results  Component Value Date   WBC 7.0 01/09/2020   HGB 9.7 (L) 01/09/2020   HCT 32.4 (L) 01/09/2020   MCV 89.5 01/09/2020   PLT 255 01/09/2020   Recent Labs    12/08/19 0921 12/19/19 0903 01/09/20 0818  NA 136 136 140  K 3.7 3.6 3.7  CL 103 104 106  CO2 _0 GLUCOSE 131* 128* 127*  BUN _1 CREATININE 0.73 0.78 0.66  CALCIUM 9.3 9.6 9.6  GFRNONAA >60 >60 >60  GFRAA >60 >60 >60  PROT 7.6 7.9 7.5  ALBUMIN 3.9 4.0 3.9  AST _2 ALT _3 ALKPHOS 120 93 113  BILITOT 0.4 0.6 0.5   Iron/TIBC/Ferritin/ %Sat    Component Value Date/Time   IRON 31 11/07/2019 0906   TIBC 187 (L) 11/07/2019 0906   FERRITIN 204 11/07/2019 0906   IRONPCTSAT 17 11/07/2019 0906      RADIOGRAPHIC STUDIES: I have personally reviewed the radiological images as listed and agreed with the findings in the report. DG Chest 2 View  Result Date: 11/13/2019 CLINICAL DATA:  Chest pain EXAM: CHEST - 2 VIEW COMPARISON:  11/26/2018 FINDINGS: The heart size and mediastinal  contours are within normal limits. Both lungs are clear. The visualized skeletal structures are unremarkable. There is a right chest wall power-injectable Port-A-Cath with tip at the cavoatrial junction via a right internal jugular vein approach. IMPRESSION: No active cardiopulmonary disease. Electronically Signed   By: Ulyses Jarred M.D.   On: 11/13/2019 05:28   CT CHEST W CONTRAST  Result Date: 10/20/2019 CLINICAL DATA:  Follow-up endometrial cancer, status post hysterectomy, no adjuvant therapy EXAM: CT CHEST, ABDOMEN, AND PELVIS WITH CONTRAST TECHNIQUE: Multidetector CT imaging of the chest, abdomen and pelvis was performed following the standard protocol during bolus administration of intravenous  contrast. CONTRAST:  140m OMNIPAQUE IOHEXOL 300 MG/ML SOLN, additional oral enteric contrast COMPARISON:  CT chest abdomen pelvis, 06/13/2019, CT chest angiogram, 10/14/2018 FINDINGS: CT CHEST FINDINGS Cardiovascular: Right chest port catheter. Aortic atherosclerosis. Normal heart size. No pericardial effusion. Mediastinum/Nodes: No enlarged mediastinal, hilar, or axillary lymph nodes. Thyroid gland, trachea, and esophagus demonstrate no significant findings. Lungs/Pleura: Unchanged 4 mm ground-glass pulmonary nodule of the left pulmonary apex (series 3, image 24). No pleural effusion or pneumothorax. Musculoskeletal: No chest wall mass or suspicious bone lesions identified. CT ABDOMEN PELVIS FINDINGS Hepatobiliary: No focal liver abnormality is seen. Status post cholecystectomy. No biliary dilatation. Pancreas: Unremarkable. No pancreatic ductal dilatation or surrounding inflammatory changes. Spleen: Normal in size without significant abnormality. Adrenals/Urinary Tract: Adrenal glands are unremarkable. Kidneys are normal, without renal calculi, solid lesion, or hydronephrosis. Bladder is unremarkable. Stomach/Bowel: Stomach is within normal limits. Appendix appears normal. No evidence of bowel wall thickening, distention, or inflammatory changes. Descending colonic diverticula. Vascular/Lymphatic: Aortic atherosclerosis. There are multiple newly enlarged retroperitoneal and right iliac lymph nodes, the largest left retroperitoneal node measuring 1.9 x 1.6 cm (series 2, image 71). Reproductive: Status post hysterectomy. Other: No abdominal wall hernia or abnormality. No abdominopelvic ascites. Musculoskeletal: No acute or significant osseous findings. IMPRESSION: 1.  Status post interval hysterectomy. 2. There are multiple newly enlarged retroperitoneal and right iliac lymph nodes, the largest left retroperitoneal node measuring 1.9 x 1.6 cm (series 2, image 71), concerning for nodal metastatic disease. 3.  Unchanged 4 mm ground-glass pulmonary nodule of the left pulmonary apex (series 3, image 24). This remains nonspecific although is again an unlikely manifestation of pulmonary metastatic disease. 4. Other chronic, incidental, and postoperative findings as above. Aortic Atherosclerosis (ICD10-I70.0). Electronically Signed   By: AEddie CandleM.D.   On: 10/20/2019 10:25   CT Angio Chest PE W and/or Wo Contrast  Result Date: 11/13/2019 CLINICAL DATA:  Positive D-dimer. History of endometrial cancer. Evaluate for pulmonary embolism. EXAM: CT ANGIOGRAPHY CHEST WITH CONTRAST TECHNIQUE: Multidetector CT imaging of the chest was performed using the standard protocol during bolus administration of intravenous contrast. Multiplanar CT image reconstructions and MIPs were obtained to evaluate the vascular anatomy. CONTRAST:  61 mL OMNIPAQUE IOHEXOL 350 MG/ML SOLN COMPARISON:  Chest CT-10/14/2018; CT chest, abdomen and pelvis-10/20/2019; 06/13/2019 FINDINGS: Vascular Findings: There is adequate opacification of the pulmonary arterial system with the main pulmonary artery measuring 429 Hounsfield units. There are no discrete filling defects within the pulmonary arterial tree to suggest pulmonary embolism. Normal caliber the main pulmonary artery. Cardiomegaly. Calcifications about the aortic root. No pericardial effusion though a small amount of fluid is seen within the pericardial recess. No evidence of thoracic aortic aneurysm or dissection on this nongated examination. Bovine configuration of the aortic arch. The branch vessels of the aortic arch appear widely patent throughout their imaged courses. Right jugular approach port a catheter  tip terminates within the superior cavoatrial junction. Review of the MIP images confirms the above findings. ---------------------------------------------------------------------------------- Nonvascular Findings: Mediastinum/Lymph Nodes: No bulky mediastinal, hilar axillary  lymphadenopathy. Lungs/Pleura: There is a minimal amount of dependent subpleural ground-glass atelectasis. No discrete focal airspace opacities. No pleural effusion or pneumothorax. The central pulmonary airways appear patent. No new pulmonary nodules. Punctate (approximately 4 mm) nodule within the left lung apex (image 23, series 6) is unchanged compared to the 06/13/2019 examination, not definitely seen on the 09/2018 exam. Upper abdomen: Limited evaluation of the upper abdomen demonstrates mild nodularity hepatic contour as could be seen in the setting of early cirrhotic change. No acute or aggressive osseous abnormalities. Moderate DDD of C5-C6 with disc space height loss, endplate irregularity and sclerosis. Musculoskeletal: No acute or aggressive osseous abnormalities. Moderate DDD of C5-C6 with disc space height loss, endplate irregularity and sclerosis. Regional soft tissues appear normal. IMPRESSION: 1. No acute cardiopulmonary disease. Specifically, no evidence of pulmonary embolism. 2. Indeterminate punctate (approximately 4 mm) left apical pulmonary nodule is unchanged compared to the 05/2019 examination of uncertain though doubtful clinical concern. Electronically Signed   By: Sandi Mariscal M.D.   On: 11/13/2019 08:14   CT ABDOMEN PELVIS W CONTRAST  Result Date: 10/20/2019 CLINICAL DATA:  Follow-up endometrial cancer, status post hysterectomy, no adjuvant therapy EXAM: CT CHEST, ABDOMEN, AND PELVIS WITH CONTRAST TECHNIQUE: Multidetector CT imaging of the chest, abdomen and pelvis was performed following the standard protocol during bolus administration of intravenous contrast. CONTRAST:  158m OMNIPAQUE IOHEXOL 300 MG/ML SOLN, additional oral enteric contrast COMPARISON:  CT chest abdomen pelvis, 06/13/2019, CT chest angiogram, 10/14/2018 FINDINGS: CT CHEST FINDINGS Cardiovascular: Right chest port catheter. Aortic atherosclerosis. Normal heart size. No pericardial effusion. Mediastinum/Nodes: No  enlarged mediastinal, hilar, or axillary lymph nodes. Thyroid gland, trachea, and esophagus demonstrate no significant findings. Lungs/Pleura: Unchanged 4 mm ground-glass pulmonary nodule of the left pulmonary apex (series 3, image 24). No pleural effusion or pneumothorax. Musculoskeletal: No chest wall mass or suspicious bone lesions identified. CT ABDOMEN PELVIS FINDINGS Hepatobiliary: No focal liver abnormality is seen. Status post cholecystectomy. No biliary dilatation. Pancreas: Unremarkable. No pancreatic ductal dilatation or surrounding inflammatory changes. Spleen: Normal in size without significant abnormality. Adrenals/Urinary Tract: Adrenal glands are unremarkable. Kidneys are normal, without renal calculi, solid lesion, or hydronephrosis. Bladder is unremarkable. Stomach/Bowel: Stomach is within normal limits. Appendix appears normal. No evidence of bowel wall thickening, distention, or inflammatory changes. Descending colonic diverticula. Vascular/Lymphatic: Aortic atherosclerosis. There are multiple newly enlarged retroperitoneal and right iliac lymph nodes, the largest left retroperitoneal node measuring 1.9 x 1.6 cm (series 2, image 71). Reproductive: Status post hysterectomy. Other: No abdominal wall hernia or abnormality. No abdominopelvic ascites. Musculoskeletal: No acute or significant osseous findings. IMPRESSION: 1.  Status post interval hysterectomy. 2. There are multiple newly enlarged retroperitoneal and right iliac lymph nodes, the largest left retroperitoneal node measuring 1.9 x 1.6 cm (series 2, image 71), concerning for nodal metastatic disease. 3. Unchanged 4 mm ground-glass pulmonary nodule of the left pulmonary apex (series 3, image 24). This remains nonspecific although is again an unlikely manifestation of pulmonary metastatic disease. 4. Other chronic, incidental, and postoperative findings as above. Aortic Atherosclerosis (ICD10-I70.0). Electronically Signed   By: AEddie Candle M.D.   On: 10/20/2019 10:25    ASSESSMENT & PLAN:  1. [place]rial cancer ([place]   2. Iron deficiency anemia due to chronic blood loss   3. Encounter for antineoplastic chemotherapy   4. Neuropathy due to  chemotherapeutic drug ([place])    #Recurrent endometrial cancer Labs reviewed and discussed with patient. Counts acceptable to proceed with cycle 4 carboplatin and Taxol.  Carboplatin has been at the level of AUC of 4.5 as patient was not able to tolerate higher AUC.  Taxol has been reduced to 135 mg per metered squared due to neuropathy. Patient received on pro for G-CSF support.  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 Continue gabapentin 100 mg 3 times daily.   #Normocytic anemia, hemoglobin 9.7, stable.  #GERD, continue omeprazole 20 mg daily. #Rheumatoid arthritis,  I recommend patient to discuss with rheumatologist and to be temporarily taken off ARAVA since her rheumatology symptoms has been stable and well controlled while on chemo.  #Need of tooth extraction, advised patient to see oral surgeon and decide if the procedure is elective versus urgent.  If effective, recommend patient to wait and to she finished 6 cycles of carboplatin and Taxol. If urgent, then she needs to have blood work done prior to the procedure.  Return of visit: 3 weeks for cycle 5 treatments. Earlie Server, MD, PhD Hematology Oncology [facility] Pager- [phone] 01/09/2020

## 2020-01-20 ENCOUNTER — Telehealth: Payer: Self-pay | Admitting: *Deleted

## 2020-01-20 NOTE — Telephone Encounter (Signed)
Pt called and stated that she wanted to cancel her 01/25/20 GYN appt  and said that she will call the office back at a later date to R/S.Marland Kitchen

## 2020-01-25 ENCOUNTER — Inpatient Hospital Stay: Payer: Medicare Other

## 2020-01-25 ENCOUNTER — Ambulatory Visit: Payer: Medicare Other | Admitting: Oncology

## 2020-01-30 ENCOUNTER — Encounter: Payer: Self-pay | Admitting: Oncology

## 2020-01-30 ENCOUNTER — Inpatient Hospital Stay: Payer: Medicare Other

## 2020-01-30 ENCOUNTER — Inpatient Hospital Stay: Payer: Medicare Other | Attending: Oncology

## 2020-01-30 ENCOUNTER — Other Ambulatory Visit: Payer: Self-pay

## 2020-01-30 ENCOUNTER — Inpatient Hospital Stay (HOSPITAL_BASED_OUTPATIENT_CLINIC_OR_DEPARTMENT_OTHER): Payer: Medicare Other | Admitting: Oncology

## 2020-01-30 VITALS — BP 141/84 | HR 86 | Temp 98.1°F | Resp 18 | Wt 182.6 lb

## 2020-01-30 DIAGNOSIS — C541 Malignant neoplasm of endometrium: Secondary | ICD-10-CM

## 2020-01-30 DIAGNOSIS — R590 Localized enlarged lymph nodes: Secondary | ICD-10-CM | POA: Insufficient documentation

## 2020-01-30 DIAGNOSIS — D649 Anemia, unspecified: Secondary | ICD-10-CM | POA: Diagnosis not present

## 2020-01-30 DIAGNOSIS — K219 Gastro-esophageal reflux disease without esophagitis: Secondary | ICD-10-CM | POA: Insufficient documentation

## 2020-01-30 DIAGNOSIS — Z5111 Encounter for antineoplastic chemotherapy: Secondary | ICD-10-CM

## 2020-01-30 DIAGNOSIS — M069 Rheumatoid arthritis, unspecified: Secondary | ICD-10-CM | POA: Insufficient documentation

## 2020-01-30 DIAGNOSIS — G620 Drug-induced polyneuropathy: Secondary | ICD-10-CM | POA: Insufficient documentation

## 2020-01-30 DIAGNOSIS — I429 Cardiomyopathy, unspecified: Secondary | ICD-10-CM | POA: Diagnosis not present

## 2020-01-30 DIAGNOSIS — T451X5A Adverse effect of antineoplastic and immunosuppressive drugs, initial encounter: Secondary | ICD-10-CM

## 2020-01-30 DIAGNOSIS — Z5189 Encounter for other specified aftercare: Secondary | ICD-10-CM | POA: Diagnosis not present

## 2020-01-30 DIAGNOSIS — E119 Type 2 diabetes mellitus without complications: Secondary | ICD-10-CM | POA: Insufficient documentation

## 2020-01-30 LAB — CBC WITH DIFFERENTIAL/PLATELET
Abs Immature Granulocytes: 0.03 10*3/uL (ref 0.00–0.07)
Basophils Absolute: 0 10*3/uL (ref 0.0–0.1)
Basophils Relative: 1 %
Eosinophils Absolute: 0.8 10*3/uL — ABNORMAL HIGH (ref 0.0–0.5)
Eosinophils Relative: 12 %
HCT: 33.3 % — ABNORMAL LOW (ref 36.0–46.0)
Hemoglobin: 9.9 g/dL — ABNORMAL LOW (ref 12.0–15.0)
Immature Granulocytes: 0 %
Lymphocytes Relative: 26 %
Lymphs Abs: 1.9 10*3/uL (ref 0.7–4.0)
MCH: 27.6 pg (ref 26.0–34.0)
MCHC: 29.7 g/dL — ABNORMAL LOW (ref 30.0–36.0)
MCV: 92.8 fL (ref 80.0–100.0)
Monocytes Absolute: 0.8 10*3/uL (ref 0.1–1.0)
Monocytes Relative: 11 %
Neutro Abs: 3.7 10*3/uL (ref 1.7–7.7)
Neutrophils Relative %: 50 %
Platelets: 231 10*3/uL (ref 150–400)
RBC: 3.59 MIL/uL — ABNORMAL LOW (ref 3.87–5.11)
RDW: 22.9 % — ABNORMAL HIGH (ref 11.5–15.5)
WBC: 7.2 10*3/uL (ref 4.0–10.5)
nRBC: 0 % (ref 0.0–0.2)

## 2020-01-30 LAB — COMPREHENSIVE METABOLIC PANEL
ALT: 9 U/L (ref 0–44)
AST: 17 U/L (ref 15–41)
Albumin: 3.9 g/dL (ref 3.5–5.0)
Alkaline Phosphatase: 113 U/L (ref 38–126)
Anion gap: 9 (ref 5–15)
BUN: 17 mg/dL (ref 8–23)
CO2: 23 mmol/L (ref 22–32)
Calcium: 9.1 mg/dL (ref 8.9–10.3)
Chloride: 106 mmol/L (ref 98–111)
Creatinine, Ser: 0.83 mg/dL (ref 0.44–1.00)
GFR calc Af Amer: >60 mL/min (ref >60)
GFR calc non Af Amer: >60 mL/min (ref >60)
Glucose, Bld: 123 mg/dL — ABNORMAL HIGH (ref 70–99)
Potassium: 3.6 mmol/L (ref 3.5–5.1)
Sodium: 138 mmol/L (ref 135–145)
Total Bilirubin: 0.4 mg/dL (ref 0.3–1.2)
Total Protein: 7.7 g/dL (ref 6.5–8.1)

## 2020-01-30 MED ORDER — SODIUM CHLORIDE 0.9 % IV SOLN
135.0000 mg/m2 | Freq: Once | INTRAVENOUS | Status: AC
  Administered 2020-01-30: 252 mg via INTRAVENOUS
  Filled 2020-01-30: qty 42

## 2020-01-30 MED ORDER — HEPARIN SOD (PORK) LOCK FLUSH 100 UNIT/ML IV SOLN
INTRAVENOUS | Status: AC
  Filled 2020-01-30: qty 5

## 2020-01-30 MED ORDER — SODIUM CHLORIDE 0.9 % IV SOLN
Freq: Once | INTRAVENOUS | Status: AC
  Filled 2020-01-30: qty 5

## 2020-01-30 MED ORDER — HEPARIN SOD (PORK) LOCK FLUSH 100 UNIT/ML IV SOLN
500.0000 [IU] | Freq: Once | INTRAVENOUS | Status: AC | PRN
  Administered 2020-01-30: 500 [IU]
  Filled 2020-01-30: qty 5

## 2020-01-30 MED ORDER — SODIUM CHLORIDE 0.9 % IV SOLN
430.0000 mg | Freq: Once | INTRAVENOUS | Status: AC
  Administered 2020-01-30: 430 mg via INTRAVENOUS
  Filled 2020-01-30: qty 43

## 2020-01-30 MED ORDER — PEGFILGRASTIM 6 MG/0.6ML ~~LOC~~ PSKT
6.0000 mg | PREFILLED_SYRINGE | Freq: Once | SUBCUTANEOUS | Status: AC
  Administered 2020-01-30: 6 mg via SUBCUTANEOUS
  Filled 2020-01-30: qty 0.6

## 2020-01-30 MED ORDER — PALONOSETRON HCL INJECTION 0.25 MG/5ML
0.2500 mg | Freq: Once | INTRAVENOUS | Status: AC
  Administered 2020-01-30: 0.25 mg via INTRAVENOUS
  Filled 2020-01-30: qty 5

## 2020-01-30 MED ORDER — FAMOTIDINE IN NACL 20-0.9 MG/50ML-% IV SOLN
20.0000 mg | Freq: Once | INTRAVENOUS | Status: AC
  Administered 2020-01-30: 20 mg via INTRAVENOUS
  Filled 2020-01-30: qty 50

## 2020-01-30 MED ORDER — SODIUM CHLORIDE 0.9 % IV SOLN
Freq: Once | INTRAVENOUS | Status: AC
  Filled 2020-01-30: qty 250

## 2020-01-30 NOTE — Progress Notes (Signed)
Hematology/Oncology follow up note [facility] [phone] [phone]   Patient Care Team: Sandra Eth, MD as PCP - General (Internal Medicine) Sandra Jacks, RN as Oncology Nurse Navigator  REFERRING PROVIDER: Felipa Eth, MD  CHIEF COMPLAINTS/REASON FOR VISIT:  Follow up for chemotherapy for endometrial cancer HISTORY OF PRESENTING ILLNESS:   Sandra Fischer is a  69 y.o.  female with PMH listed below was seen in consultation at the request of  Sandra Eth, MD  for evaluation of endometrial cancer Patient had TH/BSO sentinel lymph node biopsy by [facility] GYN oncology Dr. Allen Fischer on 06/21/2019. Extensive medical records from [facility] system review was performed by me.  Tumor size 9.3 cm, invading 99% of myometrium [3.55 out of 3.6 cm], positive right external iliac sentinel lymph nodes.  Negative washing.  No LVI, serosa is uninvolved. 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 pT1bpN13m  FIGO stage IIIC1 MSI intact HER 2 negative.   Patient was seen by Dr. BFransisca Connorson 07/06/2019 due to vaginal odor. Patient presents for establishing care and discussion for adjuvant chemotherapy radiation.  Per Dr. BBlake Divinenote, [facility].  Portex 3 regimen was recommended given subgroup analysis showing benefit in the serous histology versus clinical trial. HER2 was ordered and pending results.   Patient has a history of SVT.  10/14/2018 2D echo reviewed moderately reduced LV function with LVEF 35 to 40% with diffuse hypokinesis.  Patient had Lexiscan Myoview on 01/19/2019.  LVEF 49%.  SPECT images revealed a mild fixed inferior wall defect, scar versus artifact.  No evidence of ischemia.  She follows up with cardiology Sandra Fischer 04/26/2019 with a plan to stay on her current medication. 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 She is married  and providing care for her mother.  # 10/20/2019 CT chest abdomen pelvis with contrast showed disease recurrence.  There are multiple newly enlarged retroperitoneal and right iliac lymph nodes the largest the left retroperitoneal node measuring 1.9 x 1.6 cm concerning for nodal metastasis disease.  Unchanged 4 mm groundglass pulmonary nodule of the left pulmonary apex.  This remains nonspecific. #Cardiomyopathy, LVEF35 to 40% Last seen by cardiology 04/26/2019.  Recommend patient to continue follow-up with cardiology.  # GERD  ER visit on 11/13/2019 due to chest pain which started after he ate at a restaurant and developed burning sensation in her chest.  No associated dyspnea. Patient had a CTA which showed no pulmonary embolism.  Patient symptoms got better after GI cocktail.  Was considered to be secondary to acid reflux.  Patient takes omeprazole 20 mg daily.   INTERVAL HISTORY [name] a 69y.o. female who has above history reviewed by me today presents for follow up visit for management of endometrial cancer. Problems and complaints are listed below: Patient is status post 4 cycles of carboplatin and Taxol. Overall she tolerates well. Today she denies any mouth sores, fever, chills, nausea or vomiting. For neuropathy, she was advised to take gabapentin 100 mg 3 times daily.  Patient takes gabapentin 1 to 2 tablets as needed for her symptoms.  She reports some symptom improvement.  Intermittent numbness and tingling of her feet. Review of Systems  Constitutional: Negative for appetite change, chills, fatigue and fever.  HENT:   Negative for hearing loss and voice change.   Eyes: Negative for eye problems.  Respiratory: Negative for chest tightness and cough.   Cardiovascular: Negative  for chest pain.  Gastrointestinal: Negative for abdominal distention, abdominal pain and blood in stool.       Acid reflux  Endocrine: Negative for hot flashes.  Genitourinary: Negative for  difficulty urinating and frequency.   Musculoskeletal: Negative for arthralgias.  Skin: Negative for itching and rash.       Hair loss  Neurological: Positive for numbness. Negative for extremity weakness.  Hematological: Negative for adenopathy.  Psychiatric/Behavioral: Negative for confusion.    MEDICAL HISTORY:  Past Medical History:  Diagnosis Date  . Asthma   . Asthma   . Asthma exacerbation 08/22/2016  . Cancer ([place])   . Collagen vascular disease ([place])   . Diabetes mellitus without complication ([place])    History of Diabetes   . Environmental allergies   . Fibromyalgia   . High cholesterol   . Hypertension   . RA (rheumatoid arthritis) ([place])   . Thyroid disease     SURGICAL HISTORY: Past Surgical History:  Procedure Laterality Date  . CATARACT EXTRACTION    . CHOLECYSTECTOMY    . fibroids removed    . THYROIDECTOMY, PARTIAL    . tummy tuck      SOCIAL HISTORY: Social History   Socioeconomic History  . Marital status: Married    Spouse name: Not on file  . Number of children: Not on file  . Years of education: Not on file  . Highest education level: Not on file  Occupational History  . Not on file  Tobacco Use  . Smoking status: Never Smoker  . Smokeless tobacco: Never Used  Substance and Sexual Activity  . Alcohol use: No  . Drug use: No  . Sexual activity: Yes    Birth control/protection: Post-menopausal  Other Topics Concern  . Not on file  Social History Narrative  . Not on file   Social Determinants of Health   Financial Resource Strain:   . Difficulty of Paying Living Expenses: Not on file  Food Insecurity:   . Worried About Charity fundraiser in the Last Year: Not on file  . Ran Out of Food in the Last Year: Not on file  Transportation Needs:   . Lack of Transportation (Medical): Not on file  . Lack of Transportation (Non-Medical): Not on file  Physical Activity:   . Days of Exercise per Week: Not on file  . Minutes of Exercise per  Session: Not on file  Stress:   . Feeling of Stress : Not on file  Social Connections:   . Frequency of Communication with Friends and Family: Not on file  . Frequency of Social Gatherings with Friends and Family: Not on file  . Attends Religious Services: Not on file  . Active Member of Clubs or Organizations: Not on file  . Attends Archivist Meetings: Not on file  . Marital Status: Not on file  Intimate Partner Violence:   . Fear of Current or Ex-Partner: Not on file  . Emotionally Abused: Not on file  . Physically Abused: Not on file  . Sexually Abused: Not on file    FAMILY HISTORY: Family History  Problem Relation Age of Onset  . Diabetes Mother   . Hypertension Mother   . Hyperlipidemia Mother   . Dementia Mother   . Breast cancer Neg Hx   . Ovarian cancer Neg Hx   . Colon cancer Neg Hx     ALLERGIES:  is allergic to abatacept; codeine; latex; shellfish allergy; and penicillins.  MEDICATIONS:  Current Outpatient Medications  Medication Sig Dispense Refill  . Acetaminophen (TYLENOL EXTRA STRENGTH PO) Take 650 mg by mouth. 2 tabs twice a day PRN    . albuterol (ACCUNEB) 1.25 MG/3ML nebulizer solution Take 1 ampule by nebulization every 6 (six) hours as needed for wheezing.    Marland Kitchen albuterol (PROVENTIL HFA;VENTOLIN HFA) 108 (90 Base) MCG/ACT inhaler Inhale 2 puffs into the lungs every 6 (six) hours as needed for wheezing or shortness of breath. 1 Inhaler 0  . aspirin EC 81 MG tablet Take 81 mg by mouth daily.    Marland Kitchen azelastine (ASTELIN) 0.1 % nasal spray Place 2 sprays into both nostrils 2 (two) times daily. 30 mL 5  . budesonide-formoterol (SYMBICORT) 160-4.5 MCG/ACT inhaler Inhale 2 puffs into the lungs 2 (two) times daily.    . calcium carbonate (OS-CAL - DOSED IN MG OF ELEMENTAL CALCIUM) 1250 (500 Ca) MG tablet Take 1 tablet by mouth daily with breakfast.     . cetirizine (ZYRTEC) 10 MG tablet Take 10 mg by mouth daily.    . Cholecalciferol (D3 ADULT PO) Take 1  capsule by mouth daily.    Marland Kitchen desloratadine (CLARINEX) 5 MG tablet Take 5 mg by mouth daily.    Marland Kitchen dexamethasone (DECADRON) 4 MG tablet Take 2 tablets by mouth once a day starting the day after chemotherapy. Continue for 3 days total. Take with food. 30 tablet 1  . docusate sodium (COLACE) 50 MG capsule Take 50 mg by mouth 2 (two) times daily.    . ferrous sulfate 325 (65 FE) MG EC tablet Take 1 tablet (325 mg total) by mouth 2 (two) times daily with a meal. 60 tablet 1  . fluticasone (FLONASE) 50 MCG/ACT nasal spray Place 1 spray into both nostrils daily.     Marland Kitchen gabapentin (NEURONTIN) 100 MG capsule Take 1 capsule (100 mg total) by mouth 3 (three) times daily. 90 capsule 0  . guaiFENesin-dextromethorphan (ROBITUSSIN DM) 100-10 MG/5ML syrup Take 5 mLs by mouth every 4 (four) hours as needed for cough. 118 mL 0  . ipratropium (ATROVENT) 0.02 % nebulizer solution Take 0.5 mg by nebulization 4 (four) times daily.    Marland Kitchen leflunomide (ARAVA) 20 MG tablet Take 20 mg by mouth daily.    Marland Kitchen levothyroxine (SYNTHROID, LEVOTHROID) 88 MCG tablet Take 88 mcg by mouth daily before breakfast.    . lidocaine-prilocaine (EMLA) cream Apply to affected area once (Patient taking differently: Apply 1 application topically as needed (to port). ) 30 g 3  . lisinopril (PRINIVIL,ZESTRIL) 40 MG tablet Take 40 mg by mouth daily.    . metoprolol succinate (TOPROL-XL) 50 MG 24 hr tablet Take 50 mg by mouth daily. Take with or immediately following a meal.    . montelukast (SINGULAIR) 10 MG tablet Take 10 mg by mouth at bedtime.    Marland Kitchen omeprazole (PRILOSEC) 10 MG capsule Take 20 mg by mouth daily.    . pravastatin (PRAVACHOL) 40 MG tablet Take 40 mg by mouth daily.    . Prenatal Vit-Fe Fumarate-FA (MULTIVITAMIN-PRENATAL) 27-0.8 MG TABS tablet Take 1 tablet by mouth daily.     . prochlorperazine (COMPAZINE) 10 MG tablet Take 1 tablet (10 mg total) by mouth every 6 (six) hours as needed (Nausea or vomiting). 30 tablet 1  . senna-docusate  (SENOKOT-S) 8.6-50 MG tablet Take 1 tablet by mouth at bedtime as needed for mild constipation.     Marland Kitchen tiotropium (SPIRIVA) 18 MCG inhalation capsule Place 18 mcg into inhaler and inhale  daily.    . traMADol (ULTRAM) 50 MG tablet Take 100 mg by mouth as needed (for rheumatoid arthritis).    . verapamil (CALAN) 120 MG tablet Take 240 mg by mouth 2 (two) times daily.    Marland Kitchen EPINEPHrine 0.3 mg/0.3 mL IJ SOAJ injection Use as directed for severe allergic reaction. (Patient not taking: Reported on 01/30/2020) 2 Device 1  . olopatadine (PATANOL) 0.1 % ophthalmic solution Place 1 drop into both eyes 2 (two) times daily. (Patient not taking: Reported on 01/09/2020) 5 mL 5   No current facility-administered medications for this visit.   Facility-Administered Medications Ordered in Other Visits  Medication Dose Route Frequency Provider Last Rate Last Admin  . CARBOplatin (PARAPLATIN) 430 mg in sodium chloride 0.9 % 250 mL chemo infusion  430 mg Intravenous Once Earlie Server, MD      . heparin lock flush 100 unit/mL  500 Units Intracatheter Once PRN Earlie Server, MD      . PACLitaxel (TAXOL) 252 mg in sodium chloride 0.9 % 250 mL chemo infusion (> 30m/m2)  135 mg/m2 (Treatment Plan Recorded) Intravenous Once YEarlie Server MD      . pegfilgrastim (NEULASTA ONPRO KIT) injection 6 mg  6 mg Subcutaneous Once YEarlie Server MD         PHYSICAL EXAMINATION: ECOG PERFORMANCE STATUS: 1 - Symptomatic but completely ambulatory  Physical Exam Constitutional:      General: She is not in acute distress.    Appearance: She is obese.  HENT:     Head: Normocephalic and atraumatic.  Eyes:     General: No scleral icterus.    Pupils: Pupils are equal, round, and reactive to light.  Cardiovascular:     Rate and Rhythm: Normal rate and regular rhythm.     Heart sounds: Normal heart sounds.  Pulmonary:     Effort: Pulmonary effort is normal. No respiratory distress.     Breath sounds: No wheezing.  Abdominal:     General: Bowel  sounds are normal. There is no distension.     Palpations: Abdomen is soft. There is no mass.     Tenderness: There is no abdominal tenderness.  Musculoskeletal:        General: No deformity. Normal range of motion.     Cervical back: Normal range of motion and neck supple.  Skin:    General: Skin is warm and dry.     Findings: No erythema or rash.  Neurological:     Mental Status: She is alert and oriented to person, place, and time. Mental status is at baseline.     Cranial Nerves: No cranial nerve deficit.     Coordination: Coordination normal.  Psychiatric:        Mood and Affect: Mood normal.        Behavior: Behavior normal.        Thought Content: Thought content normal.     LABORATORY DATA:  I have reviewed the data as listed Lab Results  Component Value Date   WBC 7.2 01/30/2020   HGB 9.9 (L) 01/30/2020   HCT 33.3 (L) 01/30/2020   MCV 92.8 01/30/2020   PLT 231 01/30/2020   Recent Labs    12/19/19 0903 01/09/20 0818 01/30/20 0812  NA 136 140 138  K 3.6 3.7 3.6  CL 104 106 106  CO2 '24 23 23  ' GLUCOSE 128* 127* 123*  BUN '11 9 17  ' CREATININE 0.78 0.66 0.83  CALCIUM 9.6 9.6 9.1  GFRNONAA >60 >  60 >60  GFRAA >60 >60 >60  PROT 7.9 7.5 7.7  ALBUMIN 4.0 3.9 3.9  AST '15 17 17  ' ALT '8 7 9  ' ALKPHOS 93 113 113  BILITOT 0.6 0.5 0.4   Iron/TIBC/Ferritin/ %Sat    Component Value Date/Time   IRON 31 11/07/2019 0906   TIBC 187 (L) 11/07/2019 0906   FERRITIN 204 11/07/2019 0906   IRONPCTSAT 17 11/07/2019 0906      RADIOGRAPHIC STUDIES: I have personally reviewed the radiological images as listed and agreed with the findings in the report. DG Chest 2 View  Result Date: 11/13/2019 CLINICAL DATA:  Chest pain EXAM: CHEST - 2 VIEW COMPARISON:  11/26/2018 FINDINGS: The heart size and mediastinal contours are within normal limits. Both lungs are clear. The visualized skeletal structures are unremarkable. There is a right chest wall power-injectable Port-A-Cath with tip  at the cavoatrial junction via a right internal jugular vein approach. IMPRESSION: No active cardiopulmonary disease. Electronically Signed   By: Ulyses Jarred M.D.   On: 11/13/2019 05:28   CT Angio Chest PE W and/or Wo Contrast  Result Date: 11/13/2019 CLINICAL DATA:  Positive D-dimer. History of endometrial cancer. Evaluate for pulmonary embolism. EXAM: CT ANGIOGRAPHY CHEST WITH CONTRAST TECHNIQUE: Multidetector CT imaging of the chest was performed using the standard protocol during bolus administration of intravenous contrast. Multiplanar CT image reconstructions and MIPs were obtained to evaluate the vascular anatomy. CONTRAST:  61 mL OMNIPAQUE IOHEXOL 350 MG/ML SOLN COMPARISON:  Chest CT-10/14/2018; CT chest, abdomen and pelvis-10/20/2019; 06/13/2019 FINDINGS: Vascular Findings: There is adequate opacification of the pulmonary arterial system with the main pulmonary artery measuring 429 Hounsfield units. There are no discrete filling defects within the pulmonary arterial tree to suggest pulmonary embolism. Normal caliber the main pulmonary artery. Cardiomegaly. Calcifications about the aortic root. No pericardial effusion though a small amount of fluid is seen within the pericardial recess. No evidence of thoracic aortic aneurysm or dissection on this nongated examination. Bovine configuration of the aortic arch. The branch vessels of the aortic arch appear widely patent throughout their imaged courses. Right jugular approach port a catheter tip terminates within the superior cavoatrial junction. Review of the MIP images confirms the above findings. ---------------------------------------------------------------------------------- Nonvascular Findings: Mediastinum/Lymph Nodes: No bulky mediastinal, hilar axillary lymphadenopathy. Lungs/Pleura: There is a minimal amount of dependent subpleural ground-glass atelectasis. No discrete focal airspace opacities. No pleural effusion or pneumothorax. The central  pulmonary airways appear patent. No new pulmonary nodules. Punctate (approximately 4 mm) nodule within the left lung apex (image 23, series 6) is unchanged compared to the 06/13/2019 examination, not definitely seen on the 09/2018 exam. Upper abdomen: Limited evaluation of the upper abdomen demonstrates mild nodularity hepatic contour as could be seen in the setting of early cirrhotic change. No acute or aggressive osseous abnormalities. Moderate DDD of C5-C6 with disc space height loss, endplate irregularity and sclerosis. Musculoskeletal: No acute or aggressive osseous abnormalities. Moderate DDD of C5-C6 with disc space height loss, endplate irregularity and sclerosis. Regional soft tissues appear normal. IMPRESSION: 1. No acute cardiopulmonary disease. Specifically, no evidence of pulmonary embolism. 2. Indeterminate punctate (approximately 4 mm) left apical pulmonary nodule is unchanged compared to the 05/2019 examination of uncertain though doubtful clinical concern. Electronically Signed   By: Sandi Mariscal M.D.   On: 11/13/2019 08:14    ASSESSMENT & PLAN:  1. Endometrial cancer ([place])   2. Encounter for antineoplastic chemotherapy   3. Neuropathy due to chemotherapeutic drug ([place])   4. Anemia, unspecified  type    #Recurrent endometrial cancer Labs reviewed and discussed with patient. Counts acceptable proceed with cycle 5 carboplatin and Taxol. Carboplatin has been at the level of AUC of 4.5 as patient was not able to tolerate higher AUC.  Taxol has been reduced to 135 mg per metered squared due to neuropathy. Patient will receive on pro for G-CSF support.  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 Advised patient to use gabapentin 100 mg 2-3 times daily.  #Normocytic anemia, hemoglobin 9 9.9.  Stable.  #GERD, continue omeprazole 20 mg daily. #Rheumatoid arthritis, follow-up rheumatologist.  Return of visit: 3 weeks for cycle 6 treatments. Earlie Server, MD, PhD Hematology  [facility]cology [facility] Pager- [phone] 01/30/2020

## 2020-01-30 NOTE — Progress Notes (Signed)
Patient here for follow up. No concerns voiced.  °

## 2020-02-20 ENCOUNTER — Encounter: Payer: Self-pay | Admitting: Oncology

## 2020-02-20 ENCOUNTER — Inpatient Hospital Stay (HOSPITAL_BASED_OUTPATIENT_CLINIC_OR_DEPARTMENT_OTHER): Payer: Medicare Other | Admitting: Oncology

## 2020-02-20 ENCOUNTER — Other Ambulatory Visit: Payer: Self-pay

## 2020-02-20 ENCOUNTER — Inpatient Hospital Stay: Payer: Medicare Other

## 2020-02-20 VITALS — BP 155/91 | HR 82 | Temp 98.5°F | Resp 18 | Wt 184.9 lb

## 2020-02-20 DIAGNOSIS — C541 Malignant neoplasm of endometrium: Secondary | ICD-10-CM | POA: Diagnosis not present

## 2020-02-20 DIAGNOSIS — G620 Drug-induced polyneuropathy: Secondary | ICD-10-CM | POA: Diagnosis not present

## 2020-02-20 DIAGNOSIS — Z5111 Encounter for antineoplastic chemotherapy: Secondary | ICD-10-CM

## 2020-02-20 DIAGNOSIS — T451X5A Adverse effect of antineoplastic and immunosuppressive drugs, initial encounter: Secondary | ICD-10-CM

## 2020-02-20 DIAGNOSIS — D649 Anemia, unspecified: Secondary | ICD-10-CM

## 2020-02-20 LAB — CBC WITH DIFFERENTIAL/PLATELET
Abs Immature Granulocytes: 0 10*3/uL (ref 0.00–0.07)
Band Neutrophils: 1 %
Basophils Absolute: 0 10*3/uL (ref 0.0–0.1)
Basophils Relative: 0 %
Eosinophils Absolute: 0.8 10*3/uL — ABNORMAL HIGH (ref 0.0–0.5)
Eosinophils Relative: 15 %
HCT: 33.1 % — ABNORMAL LOW (ref 36.0–46.0)
Hemoglobin: 10.1 g/dL — ABNORMAL LOW (ref 12.0–15.0)
Lymphocytes Relative: 34 %
Lymphs Abs: 1.8 10*3/uL (ref 0.7–4.0)
MCH: 28.1 pg (ref 26.0–34.0)
MCHC: 30.5 g/dL (ref 30.0–36.0)
MCV: 92.2 fL (ref 80.0–100.0)
Monocytes Absolute: 0.3 10*3/uL (ref 0.1–1.0)
Monocytes Relative: 6 %
Neutro Abs: 2.4 10*3/uL (ref 1.7–7.7)
Neutrophils Relative %: 44 %
Platelets: 251 10*3/uL (ref 150–400)
RBC: 3.59 MIL/uL — ABNORMAL LOW (ref 3.87–5.11)
RDW: 17.5 % — ABNORMAL HIGH (ref 11.5–15.5)
Smear Review: NORMAL
WBC: 5.4 10*3/uL (ref 4.0–10.5)
nRBC: 0 % (ref 0.0–0.2)

## 2020-02-20 LAB — COMPREHENSIVE METABOLIC PANEL
ALT: 12 U/L (ref 0–44)
AST: 16 U/L (ref 15–41)
Albumin: 3.8 g/dL (ref 3.5–5.0)
Alkaline Phosphatase: 90 U/L (ref 38–126)
Anion gap: 10 (ref 5–15)
BUN: 12 mg/dL (ref 8–23)
CO2: 25 mmol/L (ref 22–32)
Calcium: 9.3 mg/dL (ref 8.9–10.3)
Chloride: 106 mmol/L (ref 98–111)
Creatinine, Ser: 0.7 mg/dL (ref 0.44–1.00)
GFR calc Af Amer: >60 mL/min (ref >60)
GFR calc non Af Amer: >60 mL/min (ref >60)
Glucose, Bld: 120 mg/dL — ABNORMAL HIGH (ref 70–99)
Potassium: 3.5 mmol/L (ref 3.5–5.1)
Sodium: 141 mmol/L (ref 135–145)
Total Bilirubin: 0.5 mg/dL (ref 0.3–1.2)
Total Protein: 7.8 g/dL (ref 6.5–8.1)

## 2020-02-20 MED ORDER — HEPARIN SOD (PORK) LOCK FLUSH 100 UNIT/ML IV SOLN
INTRAVENOUS | Status: AC
  Filled 2020-02-20: qty 5

## 2020-02-20 MED ORDER — SODIUM CHLORIDE 0.9 % IV SOLN
Freq: Once | INTRAVENOUS | Status: AC
  Filled 2020-02-20: qty 5

## 2020-02-20 MED ORDER — SODIUM CHLORIDE 0.9 % IV SOLN
135.0000 mg/m2 | Freq: Once | INTRAVENOUS | Status: AC
  Administered 2020-02-20: 252 mg via INTRAVENOUS
  Filled 2020-02-20: qty 42

## 2020-02-20 MED ORDER — SODIUM CHLORIDE 0.9 % IV SOLN
430.0000 mg | Freq: Once | INTRAVENOUS | Status: AC
  Administered 2020-02-20: 430 mg via INTRAVENOUS
  Filled 2020-02-20: qty 43

## 2020-02-20 MED ORDER — FAMOTIDINE IN NACL 20-0.9 MG/50ML-% IV SOLN
20.0000 mg | Freq: Once | INTRAVENOUS | Status: AC
  Administered 2020-02-20: 20 mg via INTRAVENOUS
  Filled 2020-02-20: qty 50

## 2020-02-20 MED ORDER — SODIUM CHLORIDE 0.9% FLUSH
10.0000 mL | Freq: Once | INTRAVENOUS | Status: AC
  Administered 2020-02-20: 10 mL via INTRAVENOUS
  Filled 2020-02-20: qty 10

## 2020-02-20 MED ORDER — PALONOSETRON HCL INJECTION 0.25 MG/5ML
0.2500 mg | Freq: Once | INTRAVENOUS | Status: AC
  Administered 2020-02-20: 0.25 mg via INTRAVENOUS
  Filled 2020-02-20: qty 5

## 2020-02-20 MED ORDER — SODIUM CHLORIDE 0.9 % IV SOLN
Freq: Once | INTRAVENOUS | Status: AC
  Filled 2020-02-20: qty 250

## 2020-02-20 MED ORDER — HEPARIN SOD (PORK) LOCK FLUSH 100 UNIT/ML IV SOLN
500.0000 [IU] | Freq: Once | INTRAVENOUS | Status: AC | PRN
  Administered 2020-02-20: 500 [IU]
  Filled 2020-02-20: qty 5

## 2020-02-20 MED ORDER — PEGFILGRASTIM 6 MG/0.6ML ~~LOC~~ PSKT
6.0000 mg | PREFILLED_SYRINGE | Freq: Once | SUBCUTANEOUS | Status: AC
  Administered 2020-02-20: 6 mg via SUBCUTANEOUS
  Filled 2020-02-20: qty 0.6

## 2020-02-20 NOTE — Progress Notes (Signed)
Patient here for follow up/ treatment. Pt will have cataract surgery to right eye and wants to know if it will be ok to proceed.

## 2020-02-20 NOTE — Progress Notes (Signed)
Hematology/Oncology follow up note [facility] Telephone:([phone] [phone]   Patient Care Team: Sandra Eth, MD as PCP - General (Internal Medicine) Sandra Jacks, RN as Oncology Nurse Navigator  REFERRING PROVIDER: Felipa Eth, MD  CHIEF COMPLAINTS/REASON FOR VISIT:  Follow up for chemotherapy for endometrial cancer HISTORY OF PRESENTING ILLNESS:   Sandra Fischer is a  69 y.o.  female with PMH listed below was seen in consultation at the request of  Sandra Eth, MD  for evaluation of endometrial cancer Patient had TH/BSO sentinel lymph node biopsy by Sandra Fischer on 06/21/2019. Extensive medical records from [place] system review was performed by me.  Tumor size 9.3 cm, invading 99% of myometrium [3.55 out of 3.6 cm], positive right external iliac sentinel lymph nodes.  Negative washing.  No LVI, serosa is uninvolved. 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 [id number]  FIGO stage IIIC1 MSI intact HER 2 negative.   Patient was seen by Dr. BFransisca Connorson 07/06/2019 due to vaginal odor. Patient presents for establishing care and discussion for adjuvant chemotherapy radiation.  Per Dr. BBlake Divinenote, [facility].  Portex 3 regimen was recommended given subgroup analysis showing benefit in the serous histology versus clinical trial. HER2 was ordered and pending results.   Patient has a history of SVT.  10/14/2018 2D echo reviewed moderately reduced LV function with LVEF 35 to 40% with diffuse hypokinesis.  Patient had Lexiscan Myoview on 01/19/2019.  LVEF 49%.  SPECT images revealed a mild fixed inferior wall defect, scar versus artifact.  No evidence of ischemia.  She follows up with cardiology Sandra Fischer 04/26/2019 with a plan to stay on her current medication. 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 She is married  and providing care for her mother.  # 10/20/2019 CT chest abdomen pelvis with contrast showed disease recurrence.  There are multiple newly enlarged retroperitoneal and right iliac lymph nodes the largest the left retroperitoneal node measuring 1.9 x 1.6 cm concerning for nodal metastasis disease.  Unchanged 4 mm groundglass pulmonary nodule of the left pulmonary apex.  This remains nonspecific. #Cardiomyopathy, LVEF35 to 40% Last seen by cardiology 04/26/2019.  Recommend patient to continue follow-up with cardiology.  # GERD  ER visit on 11/13/2019 due to chest pain which started after he ate at a restaurant and developed burning sensation in her chest.  No associated dyspnea. Patient had a CTA which showed no pulmonary embolism.  Patient symptoms got better after GI cocktail.  Was considered to be secondary to acid reflux.  Patient takes omeprazole 20 mg daily.   INTERVAL HISTORY BJyasia Markoffis a 69y.o. female who has above history reviewed by me today presents for follow up visit for management of endometrial cancer. Problems and complaints are listed below: Patient is on chemotherapy with carboplatin and Taxol.  She has finished 5 cycles.  Overall she tolerates well. Denies any fever, chills, nausea or vomiting. For neuropathy, patient takes gabapentin 100 mg 1 to 2 tablets as needed for her symptoms.  No new complaints today.  She has a cataract surgery this week.  Review of Systems  Constitutional: Negative for appetite change, chills, fatigue and fever.  HENT:   Negative for hearing loss and voice change.   Eyes: Negative for eye problems.  Respiratory: Negative for chest tightness and cough.   Cardiovascular: Negative for chest pain.  Gastrointestinal: Negative for abdominal distention,  abdominal pain and blood in stool.  Endocrine: Negative for hot flashes.  Genitourinary: Negative for difficulty urinating and frequency.   Musculoskeletal: Negative for arthralgias.  Skin: Negative  for itching and rash.       Hair loss  Neurological: Positive for numbness. Negative for extremity weakness.  Hematological: Negative for adenopathy.  Psychiatric/Behavioral: Negative for confusion.    MEDICAL HISTORY:  Past Medical History:  Diagnosis Date  . Asthma   . Asthma   . Asthma exacerbation 08/22/2016  . Cancer ([place])   . Collagen vascular disease ([place])   . Diabetes mellitus without complication ([place])    History of Diabetes   . Environmental allergies   . Fibromyalgia   . High cholesterol   . Hypertension   . RA (rheumatoid arthritis) ([place])   . Thyroid disease     SURGICAL HISTORY: Past Surgical History:  Procedure Laterality Date  . CATARACT EXTRACTION    . CHOLECYSTECTOMY    . fibroids removed    . THYROIDECTOMY, PARTIAL    . tummy tuck      SOCIAL HISTORY: Social History   Socioeconomic History  . Marital status: Married    Spouse name: Not on file  . Number of children: Not on file  . Years of education: Not on file  . Highest education level: Not on file  Occupational History  . Not on file  Tobacco Use  . Smoking status: Never Smoker  . Smokeless tobacco: Never Used  Substance and Sexual Activity  . Alcohol use: No  . Drug use: No  . Sexual activity: Yes    Birth control/protection: Post-menopausal  Other Topics Concern  . Not on file  Social History Narrative  . Not on file   Social Determinants of Health   Financial Resource Strain:   . Difficulty of Paying Living Expenses: Not on file  Food Insecurity:   . Worried About Charity fundraiser in the Last Year: Not on file  . Ran Out of Food in the Last Year: Not on file  Transportation Needs:   . Lack of Transportation (Medical): Not on file  . Lack of Transportation (Non-Medical): Not on file  Physical Activity:   . Days of Exercise per Week: Not on file  . Minutes of Exercise per Session: Not on file  Stress:   . Feeling of Stress : Not on file  Social Connections:   .  Frequency of Communication with Friends and Family: Not on file  . Frequency of Social Gatherings with Friends and Family: Not on file  . Attends Religious Services: Not on file  . Active Member of Clubs or Organizations: Not on file  . Attends Archivist Meetings: Not on file  . Marital Status: Not on file  Intimate Partner Violence:   . Fear of Current or Ex-Partner: Not on file  . Emotionally Abused: Not on file  . Physically Abused: Not on file  . Sexually Abused: Not on file    FAMILY HISTORY: Family History  Problem Relation Age of Onset  . Diabetes Mother   . Hypertension Mother   . Hyperlipidemia Mother   . Dementia Mother   . Breast cancer Neg Hx   . Ovarian cancer Neg Hx   . Colon cancer Neg Hx     ALLERGIES:  is allergic to abatacept; codeine; latex; shellfish allergy; and penicillins.  MEDICATIONS:  Current Outpatient Medications  Medication Sig Dispense Refill  . Acetaminophen (TYLENOL EXTRA STRENGTH PO) Take  650 mg by mouth. 2 tabs twice a day PRN    . albuterol (ACCUNEB) 1.25 MG/3ML nebulizer solution Take 1 ampule by nebulization every 6 (six) hours as needed for wheezing.    Marland Kitchen albuterol (PROVENTIL HFA;VENTOLIN HFA) 108 (90 Base) MCG/ACT inhaler Inhale 2 puffs into the lungs every 6 (six) hours as needed for wheezing or shortness of breath. 1 Inhaler 0  . aspirin EC 81 MG tablet Take 81 mg by mouth daily.    Marland Kitchen azelastine (ASTELIN) 0.1 % nasal spray Place 2 sprays into both nostrils 2 (two) times daily. 30 mL 5  . budesonide-formoterol (SYMBICORT) 160-4.5 MCG/ACT inhaler Inhale 2 puffs into the lungs 2 (two) times daily.    . calcium carbonate (OS-CAL - DOSED IN MG OF ELEMENTAL CALCIUM) 1250 (500 Ca) MG tablet Take 1 tablet by mouth daily with breakfast.     . cetirizine (ZYRTEC) 10 MG tablet Take 10 mg by mouth daily.    . Cholecalciferol (D3 ADULT PO) Take 1 capsule by mouth daily.    Marland Kitchen desloratadine (CLARINEX) 5 MG tablet Take 5 mg by mouth daily.     Marland Kitchen dexamethasone (DECADRON) 4 MG tablet Take 2 tablets by mouth once a day starting the day after chemotherapy. Continue for 3 days total. Take with food. 30 tablet 1  . docusate sodium (COLACE) 50 MG capsule Take 50 mg by mouth 2 (two) times daily.    . ferrous sulfate 325 (65 FE) MG EC tablet Take 1 tablet (325 mg total) by mouth 2 (two) times daily with a meal. 60 tablet 1  . fluticasone (FLONASE) 50 MCG/ACT nasal spray Place 1 spray into both nostrils daily.     Marland Kitchen gabapentin (NEURONTIN) 100 MG capsule Take 1 capsule (100 mg total) by mouth 3 (three) times daily. 90 capsule 0  . ipratropium (ATROVENT) 0.02 % nebulizer solution Take 0.5 mg by nebulization every 4 (four) hours as needed.     . leflunomide (ARAVA) 20 MG tablet Take 20 mg by mouth daily.    Marland Kitchen levothyroxine (SYNTHROID, LEVOTHROID) 88 MCG tablet Take 88 mcg by mouth daily before breakfast.    . lidocaine-prilocaine (EMLA) cream Apply to affected area once (Patient taking differently: Apply 1 application topically as needed (to port). ) 30 g 3  . lisinopril (PRINIVIL,ZESTRIL) 40 MG tablet Take 40 mg by mouth daily.    . metoprolol succinate (TOPROL-XL) 50 MG 24 hr tablet Take 50 mg by mouth daily. Take with or immediately following a meal.    . montelukast (SINGULAIR) 10 MG tablet Take 10 mg by mouth at bedtime.    Marland Kitchen omeprazole (PRILOSEC) 10 MG capsule Take 20 mg by mouth daily.    . pravastatin (PRAVACHOL) 40 MG tablet Take 40 mg by mouth daily.    . Prenatal Vit-Fe Fumarate-FA (MULTIVITAMIN-PRENATAL) 27-0.8 MG TABS tablet Take 1 tablet by mouth daily.     . prochlorperazine (COMPAZINE) 10 MG tablet Take 1 tablet (10 mg total) by mouth every 6 (six) hours as needed (Nausea or vomiting). 30 tablet 1  . senna-docusate (SENOKOT-S) 8.6-50 MG tablet Take 1 tablet by mouth at bedtime as needed for mild constipation.     Marland Kitchen tiotropium (SPIRIVA) 18 MCG inhalation capsule Place 18 mcg into inhaler and inhale daily.    . traMADol (ULTRAM) 50 MG  tablet Take 100 mg by mouth as needed (for rheumatoid arthritis).    . verapamil (CALAN) 120 MG tablet Take 240 mg by mouth 2 (two) times  daily.    Marland Kitchen EPINEPHrine 0.3 mg/0.3 mL IJ SOAJ injection Use as directed for severe allergic reaction. (Patient not taking: Reported on 01/30/2020) 2 Device 1  . guaiFENesin-dextromethorphan (ROBITUSSIN DM) 100-10 MG/5ML syrup Take 5 mLs by mouth every 4 (four) hours as needed for cough. (Patient not taking: Reported on 02/20/2020) 118 mL 0  . olopatadine (PATANOL) 0.1 % ophthalmic solution Place 1 drop into both eyes 2 (two) times daily. (Patient not taking: Reported on 01/09/2020) 5 mL 5   No current facility-administered medications for this visit.   Facility-Administered Medications Ordered in Other Visits  Medication Dose Route Frequency Provider Last Rate Last Admin  . CARBOplatin (PARAPLATIN) 430 mg in sodium chloride 0.9 % 250 mL chemo infusion  430 mg Intravenous Once Earlie Server, MD      . heparin lock flush 100 unit/mL  500 Units Intracatheter Once PRN Earlie Server, MD      . PACLitaxel (TAXOL) 252 mg in sodium chloride 0.9 % 250 mL chemo infusion (> 34m/m2)  135 mg/m2 (Treatment Plan Recorded) Intravenous Once YEarlie Server MD 97 mL/hr at 02/20/20 1033 252 mg at 02/20/20 1033  . pegfilgrastim (NEULASTA ONPRO KIT) injection 6 mg  6 mg Subcutaneous Once YEarlie Server MD         PHYSICAL EXAMINATION: ECOG PERFORMANCE STATUS: 1 - Symptomatic but completely ambulatory  Physical Exam Constitutional:      General: She is not in acute distress.    Appearance: She is obese.  HENT:     Head: Normocephalic and atraumatic.  Eyes:     General: No scleral icterus.    Pupils: Pupils are equal, round, and reactive to light.  Cardiovascular:     Rate and Rhythm: Normal rate and regular rhythm.     Heart sounds: Normal heart sounds.  Pulmonary:     Effort: Pulmonary effort is normal. No respiratory distress.     Breath sounds: No wheezing.  Abdominal:     General: Bowel  sounds are normal. There is no distension.     Palpations: Abdomen is soft. There is no mass.     Tenderness: There is no abdominal tenderness.  Musculoskeletal:        General: No deformity. Normal range of motion.     Cervical back: Normal range of motion and neck supple.  Skin:    General: Skin is warm and dry.     Findings: No erythema or rash.  Neurological:     Mental Status: She is alert and oriented to person, place, and time. Mental status is at baseline.     Cranial Nerves: No cranial nerve deficit.     Coordination: Coordination normal.  Psychiatric:        Mood and Affect: Mood normal.        Behavior: Behavior normal.        Thought Content: Thought content normal.     LABORATORY DATA:  I have reviewed the data as listed Lab Results  Component Value Date   WBC 5.4 02/20/2020   HGB 10.1 (L) 02/20/2020   HCT 33.1 (L) 02/20/2020   MCV 92.2 02/20/2020   PLT 251 02/20/2020   Recent Labs    01/09/20 0818 01/30/20 0812 02/20/20 0819  NA 140 138 141  K 3.7 3.6 3.5  CL 106 106 106  CO2 _0 GLUCOSE 127* 123* 120*  BUN _1 CREATININE 0.66 0.83 0.70  CALCIUM 9.6 9.1 9.3  GFRNONAA >60 >60 >  60  GFRAA >60 >60 >60  PROT 7.5 7.7 7.8  ALBUMIN 3.9 3.9 3.8  AST _0 ALT _1 ALKPHOS 113 113 90  BILITOT 0.5 0.4 0.5   Iron/TIBC/Ferritin/ %Sat    Component Value Date/Time   IRON 31 11/07/2019 0906   TIBC 187 (L) 11/07/2019 0906   FERRITIN 204 11/07/2019 0906   IRONPCTSAT 17 11/07/2019 0906      RADIOGRAPHIC STUDIES: I have personally reviewed the radiological images as listed and agreed with the findings in the report.No results found.  ASSESSMENT & PLAN:  1. Endometrial cancer ([place])   2. Encounter for antineoplastic chemotherapy   3. Neuropathy due to chemotherapeutic drug ([place])   4. Anemia, unspecified type    #Recurrent endometrial cancer Labs are reviewed and discussed with patient. Her counts has been stable and acceptable for  proceeding chemotherapy. Patient has elective cataract surgery this week. I had a discussion with patient that I feel that  4-5 cycles of carboplatin and Taxol are probably sufficient.  We can stop chemotherapy and repeat CT scan for assessment of treatment response.  She can also proceed with cataract surgery. Patient prefers to postpone cataract surgery and finish another cycle of chemotherapy prior to reassessment with CT scan.  Proceed with chemotherapy today with growth factor support. We also discussed about the possibility of future adjuvant radiation.  She will be referred to radiation oncology to establish care for discussion.  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  continue gabapentin. #Normocytic anemia, hemoglobin 10.1.  Stable.  #GERD, continue omeprazole 20 mg daily. #Rheumatoid arthritis, follow-up rheumatologist.  Return of visit: 3 weeks for cycle 6 treatments. Earlie Server, MD, PhD Hematology Oncology [facility] Pager- [phone] 02/20/2020

## 2020-02-29 ENCOUNTER — Inpatient Hospital Stay: Payer: Medicare Other | Attending: Obstetrics and Gynecology | Admitting: Obstetrics and Gynecology

## 2020-02-29 ENCOUNTER — Other Ambulatory Visit: Payer: Self-pay

## 2020-02-29 VITALS — BP 158/87 | HR 82 | Temp 98.4°F | Resp 18 | Wt 186.0 lb

## 2020-02-29 DIAGNOSIS — Z833 Family history of diabetes mellitus: Secondary | ICD-10-CM | POA: Diagnosis not present

## 2020-02-29 DIAGNOSIS — E119 Type 2 diabetes mellitus without complications: Secondary | ICD-10-CM | POA: Diagnosis not present

## 2020-02-29 DIAGNOSIS — D649 Anemia, unspecified: Secondary | ICD-10-CM | POA: Diagnosis not present

## 2020-02-29 DIAGNOSIS — Z9079 Acquired absence of other genital organ(s): Secondary | ICD-10-CM | POA: Diagnosis not present

## 2020-02-29 DIAGNOSIS — Z90710 Acquired absence of both cervix and uterus: Secondary | ICD-10-CM | POA: Diagnosis not present

## 2020-02-29 DIAGNOSIS — Z7951 Long term (current) use of inhaled steroids: Secondary | ICD-10-CM | POA: Diagnosis not present

## 2020-02-29 DIAGNOSIS — G620 Drug-induced polyneuropathy: Secondary | ICD-10-CM | POA: Insufficient documentation

## 2020-02-29 DIAGNOSIS — Z7982 Long term (current) use of aspirin: Secondary | ICD-10-CM | POA: Diagnosis not present

## 2020-02-29 DIAGNOSIS — Z79899 Other long term (current) drug therapy: Secondary | ICD-10-CM | POA: Insufficient documentation

## 2020-02-29 DIAGNOSIS — Z90722 Acquired absence of ovaries, bilateral: Secondary | ICD-10-CM | POA: Insufficient documentation

## 2020-02-29 DIAGNOSIS — E7800 Pure hypercholesterolemia, unspecified: Secondary | ICD-10-CM | POA: Insufficient documentation

## 2020-02-29 DIAGNOSIS — Z8349 Family history of other endocrine, nutritional and metabolic diseases: Secondary | ICD-10-CM | POA: Insufficient documentation

## 2020-02-29 DIAGNOSIS — J45909 Unspecified asthma, uncomplicated: Secondary | ICD-10-CM | POA: Insufficient documentation

## 2020-02-29 DIAGNOSIS — I10 Essential (primary) hypertension: Secondary | ICD-10-CM | POA: Insufficient documentation

## 2020-02-29 DIAGNOSIS — T451X5A Adverse effect of antineoplastic and immunosuppressive drugs, initial encounter: Secondary | ICD-10-CM | POA: Insufficient documentation

## 2020-02-29 DIAGNOSIS — M069 Rheumatoid arthritis, unspecified: Secondary | ICD-10-CM | POA: Diagnosis not present

## 2020-02-29 DIAGNOSIS — Z7952 Long term (current) use of systemic steroids: Secondary | ICD-10-CM | POA: Insufficient documentation

## 2020-02-29 DIAGNOSIS — C541 Malignant neoplasm of endometrium: Secondary | ICD-10-CM

## 2020-02-29 DIAGNOSIS — Z8249 Family history of ischemic heart disease and other diseases of the circulatory system: Secondary | ICD-10-CM | POA: Diagnosis not present

## 2020-02-29 LAB — WET PREP, GENITAL
Clue Cells Wet Prep HPF POC: NONE SEEN
Sperm: NONE SEEN
Trich, Wet Prep: NONE SEEN

## 2020-02-29 MED ORDER — FLUCONAZOLE 150 MG PO TABS: ORAL_TABLET | ORAL | 0 refills | Status: DC

## 2020-02-29 NOTE — Progress Notes (Addendum)
Gynecologic Oncology Interval Visit   Referring Provider: Dr. Amalia Hailey  Chief Complaint: Stage IIIC-1 serous endometrial cancer  Subjective:  Sandra Fischer is a 69 y.o. G2P0 female (s/p laparotomy myomectomy for leiomyoma), initially seen in consultation from Dr. 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   She underwent TLH_BSO with Dr. Allen Norris on 06/21/2019. Tumor size 9.3 cm, invading 99% of myometrium (3.55 of 3.6 cm), positive right external iliac sentinel lymph node. Negative washings. MSS/MMR - Intact/normal. HER2 - negative at 28%/1+. Case was discussed at [place] on 07/04/2019. Possible PORTEC3 regimen given subgroup analysis showing benefit in serous history vs clinical trial was discussed. Based on this, adjuvant chemo and radiation was recommended which she initially declined. She had recurrent disease with adenopathy on 10/20/2019 CT. She agreed to treatment and received 6 cycles of carbo-taxol chemotherapy 11/07/2019-02/20/20 (dose reduced d/t neuropathy).   She presents today for surveillance. She complaints of a foul-smelling white discharge. She has been treated for BV and using douches for the odor.   Gynecologic Oncology History:  She initially presented to Dr. Amalia Hailey as [phone] female with complaint of postmenopausal bleeding, intermittent, heavy at times.    Transabdominal ultrasound on 05/05/2019 demonstrated endometrium measuring 36m. Uterus anteverted measuring 11.3 x 6.6 x 7.9 cm, heterogeous echo texture w/o evidence of focal masses. Within uterus multiple suspected fibroids measuring 7.2 x 4.8 x 6.6 cm and 2.6 x 1.7 x 3.1 cm. Ovaries not visualized. No adnexal masses. No free fluid in cul de sac.   Endometrial biopsy was performed on 05/20/2019 and demonstrated high-grade mixed endometrioid, predominantly, and serous carcinoma.  She complains of persistent  bleeding and fatigue which she attributes to the bleeding. She presents today for management. She has significant medical issues including rheumatoid arthritis requiring chronic steroids (5-10 mg daily) for a long time (she does not know how long) and leflunomide (ARAVA). She also has undergone myomectomy for leiomyoma and abdominoplasty.  She also has a history of cardiac disease. She Cardioversion for SVT on 10/17/2018. Her cardiologist Dr. [name] [facility] and shortness of breath, noted to be in SVT, converted to sinus rhythm with adenosine,in the setting of colitis, anemia with hemoglobin 8.5, and untreated hypothyroidism with TSH 25, which has since returned to normal range.2D echocardiogram revealed moderately reduced LV function with LVEF 35 to 40% with diffuse hypokinesis. The patient underwent Lexiscan Myoview on 01/19/2019, which revealed revealed LVEF 49%, a mild fixed inferior wall defect, scar versus artifact, with no evidence of ischemia. She was last seen on 04/26/2019 by Cardiology ([name] with a plan to stay on her current medications and she was counseled about low sodium diet, DASH, and continuing hyperlipidemia medications.   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 She is married and provides care for her mother.   06/14/2019 CT C/A/P IMPRESSION: 1. Bulky fibroid uterus. There is no direct noncontrast CT evidence of endometrial malignancy. No evidence of lymphadenopathy or metastatic disease in the chest, abdomen, or pelvis. Please note that noncontrast CT is limited for the evaluation of solid organ metastases. 2. Stable 4 mm ground-glass pulmonary nodule of the left pulmonary apex (series 3, image 24). Although nonspecific, this is an unlikely isolated manifestation of metastatic disease. Attention on follow-up. 3. Chronic, incidental, and postoperative findings as detailed above.  She underwent TLH_BSO with Dr.  [name]  06/21/2019.   Tumor size 9.3 cm, invading 99% of myometrium (3.55 of 3.6 cm), positive right external iliac sentinel lymph node. Negative washings. MSS/MMR - Intact/normal. HER2 - negative at 28%/1+.   Case was discussed at [place] on 07/04/2019. Possible PORTEC3 regimen given subgroup analysis showing benefit in serous history vs clinical trial was discussed.   Based on pathology, adjuvant chemotherapy & radiation was recommended. She saw Dr. [name] on 07/14/2019 and lengthy discussion regarding rationale of adjuvant treatment. She declined adjuvant treatment.  10/20/2019- CT Chest Abdomen Pelvis W Contrast 1.  Status post interval hysterectomy. 2. There are multiple newly enlarged retroperitoneal and right iliac lymph nodes, the largest left retroperitoneal node measuring 1.9 x 1.6 cm (series 2, image 71), concerning for nodal metastatic disease. 3. Unchanged 4 mm ground-glass pulmonary nodule of the left pulmonary apex (series 3, image 24). This remains nonspecific although is again an unlikely manifestation of pulmonary metastatic disease. 4. Other chronic, incidental, and postoperative findings as above. Aortic Atherosclerosis (ICD10-I70.0).  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 She has been doing daily tap water douches and managing sweating with antiperspirant powder which she says helps.     Problem List: Patient Active Problem List   Diagnosis Date Noted  . Encounter for antineoplastic chemotherapy 01/30/2020  . Neuropathy due to chemotherapeutic drug ([place]) 01/30/2020  . Anemia 01/30/2020  . Gastroesophageal reflux disease 12/08/2019  . Microcytic anemia 11/07/2019  . Port-A-Cath in place 11/04/2019  . Rheumatoid arthritis ([place]) 10/29/2019  . Goals of care, counseling/discussion 07/20/2019  . Endometrial cancer ([place]) 07/15/2019  . Fibromyalgia   . Cardiomyopathy ([place]) 12/06/2018  . Perennial and seasonal  allergic rhinitis 02/09/2018  . Moderate persistent asthma 02/09/2018  . Allergic conjunctivitis 02/09/2018  . History of food allergy 02/09/2018  . Allergic reaction 02/09/2018  . RA (rheumatoid arthritis) ([place]) 08/22/2016  . HTN (hypertension) 08/22/2016    Past Medical History: Past Medical History:  Diagnosis Date  . Asthma   . Asthma   . Asthma exacerbation 08/22/2016  . Cancer ([place])   . Collagen vascular disease ([place])   . Diabetes mellitus without complication ([place])    History of Diabetes   . Environmental allergies   . Fibromyalgia   . High cholesterol   . Hypertension   . RA (rheumatoid arthritis) ([place])   . Thyroid disease     Past Surgical History: Past Surgical History:  Procedure Laterality Date  . CATARACT EXTRACTION    . CHOLECYSTECTOMY    . fibroids removed    . THYROIDECTOMY, PARTIAL    . tummy tuck      Past Gynecologic History:  As per HPI  OB History:  OB History  Gravida Para Term Preterm AB Living  2       2    SAB TAB Ectopic Multiple Live Births  2            # Outcome Date GA Lbr Len/2nd Weight Sex Delivery Anes PTL Lv  2 SAB           1 SAB             Family History: Family History  Problem Relation Age of Onset  . Diabetes Mother   . Hypertension Mother   . Hyperlipidemia Mother   . Dementia Mother   . Breast cancer Neg Hx   . Ovarian cancer Neg Hx   . Colon cancer Neg Hx  Social History: Social History   Socioeconomic History  . Marital status: Married    Spouse name: Not on file  . Number of children: Not on file  . Years of education: Not on file  . Highest education level: Not on file  Occupational History  . Not on file  Tobacco Use  . Smoking status: Never Smoker  . Smokeless tobacco: Never Used  Substance and Sexual Activity  . Alcohol use: No  . Drug use: No  . Sexual activity: Yes    Birth control/protection: Post-menopausal  Other Topics Concern  . Not on file  Social History Narrative  . Not  on file   Social Determinants of Health   Financial Resource Strain:   . Difficulty of Paying Living Expenses: Not on file  Food Insecurity:   . Worried About Charity fundraiser in the Last Year: Not on file  . Ran Out of Food in the Last Year: Not on file  Transportation Needs:   . Lack of Transportation (Medical): Not on file  . Lack of Transportation (Non-Medical): Not on file  Physical Activity:   . Days of Exercise per Week: Not on file  . Minutes of Exercise per Session: Not on file  Stress:   . Feeling of Stress : Not on file  Social Connections:   . Frequency of Communication with Friends and Family: Not on file  . Frequency of Social Gatherings with Friends and Family: Not on file  . Attends Religious Services: Not on file  . Active Member of Clubs or Organizations: Not on file  . Attends Archivist Meetings: Not on file  . Marital Status: Not on file  Intimate Partner Violence:   . Fear of Current or Ex-Partner: Not on file  . Emotionally Abused: Not on file  . Physically Abused: Not on file  . Sexually Abused: Not on file    Allergies: Allergies  Allergen Reactions  . Abatacept Hives  . Codeine Hives, Nausea And Vomiting and Nausea Only  . Latex Itching and Hives  . Shellfish Allergy Swelling  . Penicillins Nausea And Vomiting    Current Medications: Current Outpatient Medications  Medication Sig Dispense Refill  . Acetaminophen (TYLENOL EXTRA STRENGTH PO) Take 650 mg by mouth. 2 tabs twice a day PRN    . albuterol (ACCUNEB) 1.25 MG/3ML nebulizer solution Take 1 ampule by nebulization every 6 (six) hours as needed for wheezing.    Marland Kitchen albuterol (PROVENTIL HFA;VENTOLIN HFA) 108 (90 Base) MCG/ACT inhaler Inhale 2 puffs into the lungs every 6 (six) hours as needed for wheezing or shortness of breath. 1 Inhaler 0  . aspirin EC 81 MG tablet Take 81 mg by mouth daily.    Marland Kitchen azelastine (ASTELIN) 0.1 % nasal spray Place 2 sprays into both nostrils 2 (two)  times daily. 30 mL 5  . budesonide-formoterol (SYMBICORT) 160-4.5 MCG/ACT inhaler Inhale 2 puffs into the lungs 2 (two) times daily.    . calcium carbonate (OS-CAL - DOSED IN MG OF ELEMENTAL CALCIUM) 1250 (500 Ca) MG tablet Take 1 tablet by mouth daily with breakfast.     . cetirizine (ZYRTEC) 10 MG tablet Take 10 mg by mouth daily.    . Cholecalciferol (D3 ADULT PO) Take 1 capsule by mouth daily.    Marland Kitchen desloratadine (CLARINEX) 5 MG tablet Take 5 mg by mouth daily.    Marland Kitchen dexamethasone (DECADRON) 4 MG tablet Take 2 tablets by mouth once a day starting the day after  chemotherapy. Continue for 3 days total. Take with food. 30 tablet 1  . docusate sodium (COLACE) 50 MG capsule Take 50 mg by mouth 2 (two) times daily.    Marland Kitchen EPINEPHrine 0.3 mg/0.3 mL IJ SOAJ injection Use as directed for severe allergic reaction. (Patient not taking: Reported on 01/30/2020) 2 Device 1  . ferrous sulfate 325 (65 FE) MG EC tablet Take 1 tablet (325 mg total) by mouth 2 (two) times daily with a meal. 60 tablet 1  . fluticasone (FLONASE) 50 MCG/ACT nasal spray Place 1 spray into both nostrils daily.     Marland Kitchen gabapentin (NEURONTIN) 100 MG capsule Take 1 capsule (100 mg total) by mouth 3 (three) times daily. 90 capsule 0  . guaiFENesin-dextromethorphan (ROBITUSSIN DM) 100-10 MG/5ML syrup Take 5 mLs by mouth every 4 (four) hours as needed for cough. (Patient not taking: Reported on 02/20/2020) 118 mL 0  . ipratropium (ATROVENT) 0.02 % nebulizer solution Take 0.5 mg by nebulization every 4 (four) hours as needed.     . leflunomide (ARAVA) 20 MG tablet Take 20 mg by mouth daily.    Marland Kitchen levothyroxine (SYNTHROID, LEVOTHROID) 88 MCG tablet Take 88 mcg by mouth daily before breakfast.    . lidocaine-prilocaine (EMLA) cream Apply to affected area once (Patient taking differently: Apply 1 application topically as needed (to port). ) 30 g 3  . lisinopril (PRINIVIL,ZESTRIL) 40 MG tablet Take 40 mg by mouth daily.    . metoprolol succinate  (TOPROL-XL) 50 MG 24 hr tablet Take 50 mg by mouth daily. Take with or immediately following a meal.    . montelukast (SINGULAIR) 10 MG tablet Take 10 mg by mouth at bedtime.    Marland Kitchen olopatadine (PATANOL) 0.1 % ophthalmic solution Place 1 drop into both eyes 2 (two) times daily. (Patient not taking: Reported on 01/09/2020) 5 mL 5  . omeprazole (PRILOSEC) 10 MG capsule Take 20 mg by mouth daily.    . pravastatin (PRAVACHOL) 40 MG tablet Take 40 mg by mouth daily.    . Prenatal Vit-Fe Fumarate-FA (MULTIVITAMIN-PRENATAL) 27-0.8 MG TABS tablet Take 1 tablet by mouth daily.     . prochlorperazine (COMPAZINE) 10 MG tablet Take 1 tablet (10 mg total) by mouth every 6 (six) hours as needed (Nausea or vomiting). 30 tablet 1  . senna-docusate (SENOKOT-S) 8.6-50 MG tablet Take 1 tablet by mouth at bedtime as needed for mild constipation.     Marland Kitchen tiotropium (SPIRIVA) 18 MCG inhalation capsule Place 18 mcg into inhaler and inhale daily.    . traMADol (ULTRAM) 50 MG tablet Take 100 mg by mouth as needed (for rheumatoid arthritis).    . verapamil (CALAN) 120 MG tablet Take 240 mg by mouth 2 (two) times daily.     No current facility-administered medications for this visit.   Review of Systems General:  fatigue Skin: no complaints Eyes: no complaints HEENT: allergies Breasts: no complaints Pulmonary: short of breath when ambulating long distances Cardiac: no complaints Gastrointestinal: no complaints Genitourinary/Sexual: no complaints Ob/Gyn: vaginal discharge Musculoskeletal: no complaints Hematology: no complaints Neurologic/Psych: numbness & tingling   Objective:  Physical Examination:  BP (!) 158/87   Pulse 82   Temp 98.4 F (36.9 C) (Tympanic)   Resp 18   Wt 186 lb (84.4 kg)   BMI 34.02 kg/m     ECOG Performance Status: 1 - Symptomatic but completely ambulatory   GENERAL: Patient is a well appearing female in no acute distress HEENT:  PERRL, neck supple with midline  trachea. Thyroid  without masses.  NODES:  No cervical, supraclavicular, axillary, or inguinal lymphadenopathy palpated.  LUNGS:  Clear to auscultation bilaterally.  No wheezes or rhonchi. HEART:  Regular rate and rhythm. No murmur appreciated. ABDOMEN:  Soft, nontender. Incisions all well healed. No hernias/ascites or masses.  MSK:  No focal spinal tenderness to palpation.  EXTREMITIES:  No peripheral edema.   SKIN:  Clear with no obvious rashes or skin changes. No nail dyscrasia. NEURO:  Nonfocal. Well oriented.  Appropriate affect.  Pelvic:exam chaperoned by nurse tech EGBUS: no lesions Cervix: surgically absent Vagina: no lesions, minimal white runny discharge, no bleeding Uterus: surgically absent Adnexa: no palpable masses Rectovaginal: deferred   Labs: No Labs on site today  Radiologic Imaging:  No imaging on site today     Assessment:  Sandra Fischer is a 69 y.o. 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  Tumor size 9.3 cm, invading 99% of myometrium (3.55 of 3.6cm), positive right external iliac sentinel lymph nodes. Negative washings. Recurrent disease 10/20/2019 with adenopathy up to 1.9 x 1.6 cm.   Pulmonary nodule, uncertain etiology.   Medical co-morbidities complicating care: She has significant medical issues including rheumatoid arthritis requiring chronic steroids (5-10 mg daily) for a long time (she does not know how long) and leflunomide (ARAVA); HTN; Dilated cardiomyopathy; hyperlipidemia; multiple intra-abdominal surgeries (myomectomy for leiomyoma, cholecystectomy, and abdominoplasty) ; obesity Body mass index is 34.02 kg/m.  Plan:   Problem List Items Addressed This Visit      Genitourinary   Endometrial cancer ([place]) - Primary       We ordered a WET prep today. Results consistent with yeast infection. Will send  diflucan prescription. She is scheduled for a CT scan with Dr. [name] later this month. Pending the findings there may be a role for radiation therapy. We will follow up these studies and discuss with Dr. [name]. Plan for follow up in 3 months.   Beckey Rutter, DNP, AGNP-C [place] at [facility] [phone] (clinic)  I personally had a face to face interaction and evaluated the patient jointly with the NP, Ms. Beckey Rutter.  I have reviewed her history and available records and have performed the key portions of the physical exam including abdominal exam, pelvic exam with my findings confirming those documented above by the APP.  I have discussed the case with the APP and the patient.  I agree with the above documentation, assessment and plan which was fully formulated by me.  Counseling was completed by me.   I personally saw the patient and performed a substantive portion of this encounter in conjunction with the listed APP as documented above.  [name] Gaetana Michaelis, MD   CC:  Dr. Amalia Hailey

## 2020-02-29 NOTE — Addendum Note (Signed)
Addended by: Verlon Au on: 02/29/2020 04:14 PM   Modules accepted: Orders

## 2020-02-29 NOTE — Progress Notes (Signed)
Patient states that she is having vaginal discharge and thinks that she has BV

## 2020-03-05 ENCOUNTER — Other Ambulatory Visit: Payer: Self-pay | Admitting: Oncology

## 2020-03-06 ENCOUNTER — Other Ambulatory Visit: Payer: Self-pay | Admitting: Nurse Practitioner

## 2020-03-06 ENCOUNTER — Telehealth: Payer: Self-pay | Admitting: *Deleted

## 2020-03-06 MED ORDER — FLUCONAZOLE 150 MG PO TABS: ORAL_TABLET | ORAL | 0 refills | Status: DC

## 2020-03-06 NOTE — Telephone Encounter (Signed)
Returned patient's call. Vaginal discharge is somewhat improved but not yet resolved. It was been a week since she started treatment. She says that she often has to have a total of 4 doses of diflucan to resolve her symptoms. Will plan to repeat diflucan. Prescription sent.

## 2020-03-06 NOTE — Telephone Encounter (Signed)
Patient called stating that the 2 pills did not work and it has been a week and she still has an odor.

## 2020-03-12 ENCOUNTER — Ambulatory Visit
Admission: RE | Admit: 2020-03-12 | Discharge: 2020-03-12 | Disposition: A | Discharge status: Home / Self Care | Payer: Medicare Other | Source: Ambulatory Visit | Attending: Oncology | Admitting: Oncology

## 2020-03-12 ENCOUNTER — Other Ambulatory Visit: Payer: Self-pay

## 2020-03-12 DIAGNOSIS — C541 Malignant neoplasm of endometrium: Secondary | ICD-10-CM

## 2020-03-12 MED ORDER — IOHEXOL 300 MG/ML  SOLN
100.0000 mL | Freq: Once | INTRAMUSCULAR | Status: AC | PRN
  Administered 2020-03-12: 100 mL via INTRAVENOUS

## 2020-03-14 ENCOUNTER — Other Ambulatory Visit: Payer: Self-pay | Admitting: Nurse Practitioner

## 2020-03-14 ENCOUNTER — Inpatient Hospital Stay (HOSPITAL_BASED_OUTPATIENT_CLINIC_OR_DEPARTMENT_OTHER): Payer: Medicare Other | Admitting: Oncology

## 2020-03-14 ENCOUNTER — Encounter: Payer: Self-pay | Admitting: Oncology

## 2020-03-14 ENCOUNTER — Inpatient Hospital Stay: Payer: Medicare Other

## 2020-03-14 VITALS — BP 157/93 | HR 88 | Temp 98.3°F | Resp 18 | Wt 184.7 lb

## 2020-03-14 DIAGNOSIS — C541 Malignant neoplasm of endometrium: Secondary | ICD-10-CM

## 2020-03-14 DIAGNOSIS — M069 Rheumatoid arthritis, unspecified: Secondary | ICD-10-CM

## 2020-03-14 DIAGNOSIS — G620 Drug-induced polyneuropathy: Secondary | ICD-10-CM

## 2020-03-14 DIAGNOSIS — T451X5A Adverse effect of antineoplastic and immunosuppressive drugs, initial encounter: Secondary | ICD-10-CM

## 2020-03-14 DIAGNOSIS — Z95828 Presence of other vascular implants and grafts: Secondary | ICD-10-CM | POA: Diagnosis not present

## 2020-03-14 DIAGNOSIS — D649 Anemia, unspecified: Secondary | ICD-10-CM

## 2020-03-14 LAB — COMPREHENSIVE METABOLIC PANEL
ALT: 11 U/L (ref 0–44)
AST: 16 U/L (ref 15–41)
Albumin: 4 g/dL (ref 3.5–5.0)
Alkaline Phosphatase: 94 U/L (ref 38–126)
Anion gap: 10 (ref 5–15)
BUN: 15 mg/dL (ref 8–23)
CO2: 24 mmol/L (ref 22–32)
Calcium: 9.5 mg/dL (ref 8.9–10.3)
Chloride: 108 mmol/L (ref 98–111)
Creatinine, Ser: 0.9 mg/dL (ref 0.44–1.00)
GFR calc Af Amer: >60 mL/min (ref >60)
GFR calc non Af Amer: >60 mL/min (ref >60)
Glucose, Bld: 117 mg/dL — ABNORMAL HIGH (ref 70–99)
Potassium: 4 mmol/L (ref 3.5–5.1)
Sodium: 142 mmol/L (ref 135–145)
Total Bilirubin: 0.5 mg/dL (ref 0.3–1.2)
Total Protein: 7.9 g/dL (ref 6.5–8.1)

## 2020-03-14 LAB — CBC WITH DIFFERENTIAL/PLATELET
Abs Immature Granulocytes: 0.03 10*3/uL (ref 0.00–0.07)
Basophils Absolute: 0 10*3/uL (ref 0.0–0.1)
Basophils Relative: 0 %
Eosinophils Absolute: 0.6 10*3/uL — ABNORMAL HIGH (ref 0.0–0.5)
Eosinophils Relative: 9 %
HCT: 34.5 % — ABNORMAL LOW (ref 36.0–46.0)
Hemoglobin: 10.7 g/dL — ABNORMAL LOW (ref 12.0–15.0)
Immature Granulocytes: 0 %
Lymphocytes Relative: 33 %
Lymphs Abs: 2.3 10*3/uL (ref 0.7–4.0)
MCH: 28.2 pg (ref 26.0–34.0)
MCHC: 31 g/dL (ref 30.0–36.0)
MCV: 90.8 fL (ref 80.0–100.0)
Monocytes Absolute: 1 10*3/uL (ref 0.1–1.0)
Monocytes Relative: 14 %
Neutro Abs: 3.1 10*3/uL (ref 1.7–7.7)
Neutrophils Relative %: 44 %
Platelets: 247 10*3/uL (ref 150–400)
RBC: 3.8 MIL/uL — ABNORMAL LOW (ref 3.87–5.11)
RDW: 15.4 % (ref 11.5–15.5)
WBC: 7.1 10*3/uL (ref 4.0–10.5)
nRBC: 0 % (ref 0.0–0.2)

## 2020-03-14 NOTE — Progress Notes (Signed)
Patient reports having good and bad days with her appetite but she does eat on her bad days.

## 2020-03-14 NOTE — Progress Notes (Signed)
Ref for gyn-onc case conference placed for patient at request of Dr. [name] and Dr. Theora Gianotti.

## 2020-03-15 LAB — CA 125: Cancer Antigen (CA) 125: 18.9 U/mL (ref 0.0–38.1)

## 2020-03-15 NOTE — Progress Notes (Signed)
Hematology/Oncology follow up note [facility] [phone] [phone]   Patient Care Team: Felipa Eth, MD as PCP - General (Internal Medicine) Clent Jacks, RN as Oncology Nurse Navigator  REFERRING PROVIDER: Felipa Eth, MD  CHIEF COMPLAINTS/REASON FOR VISIT:  Follow up for chemotherapy for endometrial cancer HISTORY OF PRESENTING ILLNESS:   Sandra Fischer is a  69 y.o.  female with PMH listed below was seen in consultation at the request of  Felipa Eth, MD  for evaluation of endometrial cancer Patient had TH/BSO sentinel lymph node biopsy by [facility] GYN oncology Dr. Allen Norris on 06/21/2019. Extensive medical records from [place] system review was performed by me.  Tumor size 9.3 cm, invading 99% of myometrium [3.55 out of 3.6 cm], positive right external iliac sentinel lymph nodes.  Negative washing.  No LVI, serosa is uninvolved. 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 [id number]  FIGO stage IIIC1 MSI intact HER 2 negative.   Patient was seen by Dr. BFransisca Connorson 07/06/2019 due to vaginal odor. Patient presents for establishing care and discussion for adjuvant chemotherapy radiation.  Per Dr. BBlake Divinenote, [facility].  Portex 3 regimen was recommended given subgroup analysis showing benefit in the serous histology versus clinical trial. HER2 was ordered and pending results.   Patient has a history of SVT.  10/14/2018 2D echo reviewed moderately reduced LV function with LVEF 35 to 40% with diffuse hypokinesis.  Patient had Lexiscan Myoview on 01/19/2019.  LVEF 49%.  SPECT images revealed a mild fixed inferior wall defect, scar versus artifact.  No evidence of ischemia.  She follows up with cardiology Dr.Paraschos 04/26/2019 with a plan to stay on her current medication. 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 She is married  and providing care for her mother.  # 10/20/2019 CT chest abdomen pelvis with contrast showed disease recurrence.  There are multiple newly enlarged retroperitoneal and right iliac lymph nodes the largest the left retroperitoneal node measuring 1.9 x 1.6 cm concerning for nodal metastasis disease.  Unchanged 4 mm groundglass pulmonary nodule of the left pulmonary apex.  This remains nonspecific. #Cardiomyopathy, LVEF35 to 40% Last seen by cardiology 04/26/2019.  Recommend patient to continue follow-up with cardiology.  # GERD  ER visit on 11/13/2019 due to chest pain which started after he ate at a restaurant and developed burning sensation in her chest.  No associated dyspnea. Patient had a CTA which showed no pulmonary embolism.  Patient symptoms got better after GI cocktail.  Was considered to be secondary to acid reflux.  Patient takes omeprazole 20 mg daily.   INTERVAL HISTORY Sandra Finelliis a 69y.o. female who has above history reviewed by me today presents for follow up visit for management of endometrial cancer. Problems and complaints are listed below: S/p 6 cycles of chemotherapy with carboplatin and Taxol.   She had CT chest abdomen pelvis for evaluation of treatment response.  She feels well. No new complaints.  Review of Systems  Constitutional: Negative for appetite change, chills, fatigue and fever.  HENT:   Negative for hearing loss and voice change.   Eyes: Negative for eye problems.  Respiratory: Negative for chest tightness and cough.   Cardiovascular: Negative for chest pain.  Gastrointestinal: Negative for abdominal distention, abdominal pain and blood in stool.  Endocrine: Negative for hot flashes.  Genitourinary: Negative for difficulty urinating and frequency.   Musculoskeletal: Negative for arthralgias.  Skin: Negative for itching and rash.  Neurological: Positive for numbness. Negative for extremity weakness.  Hematological: Negative for adenopathy.   Psychiatric/Behavioral: Negative for confusion.    MEDICAL HISTORY:  Past Medical History:  Diagnosis Date  . Asthma   . Asthma   . Asthma exacerbation 08/22/2016  . Cancer ([place])   . Collagen vascular disease ([place])   . Diabetes mellitus without complication ([place])    History of Diabetes   . Environmental allergies   . Fibromyalgia   . High cholesterol   . Hypertension   . RA (rheumatoid arthritis) ([place])   . Thyroid disease     SURGICAL HISTORY: Past Surgical History:  Procedure Laterality Date  . CATARACT EXTRACTION    . CHOLECYSTECTOMY    . fibroids removed    . THYROIDECTOMY, PARTIAL    . tummy tuck      SOCIAL HISTORY: Social History   Socioeconomic History  . Marital status: Married    Spouse name: Not on file  . Number of children: Not on file  . Years of education: Not on file  . Highest education level: Not on file  Occupational History  . Not on file  Tobacco Use  . Smoking status: Never Smoker  . Smokeless tobacco: Never Used  Substance and Sexual Activity  . Alcohol use: No  . Drug use: No  . Sexual activity: Yes    Birth control/protection: Post-menopausal  Other Topics Concern  . Not on file  Social History Narrative  . Not on file   Social Determinants of Health   Financial Resource Strain:   . Difficulty of Paying Living Expenses:   Food Insecurity:   . Worried About Charity fundraiser in the Last Year:   . Arboriculturist in the Last Year:   Transportation Needs:   . Film/video editor (Medical):   Marland Kitchen Lack of Transportation (Non-Medical):   Physical Activity:   . Days of Exercise per Week:   . Minutes of Exercise per Session:   Stress:   . Feeling of Stress :   Social Connections:   . Frequency of Communication with Friends and Family:   . Frequency of Social Gatherings with Friends and Family:   . Attends Religious Services:   . Active Member of Clubs or Organizations:   . Attends Archivist Meetings:   Marland Kitchen  Marital Status:   Intimate Partner Violence:   . Fear of Current or Ex-Partner:   . Emotionally Abused:   Marland Kitchen Physically Abused:   . Sexually Abused:     FAMILY HISTORY: Family History  Problem Relation Age of Onset  . Diabetes Mother   . Hypertension Mother   . Hyperlipidemia Mother   . Dementia Mother   . Breast cancer Neg Hx   . Ovarian cancer Neg Hx   . Colon cancer Neg Hx     ALLERGIES:  is allergic to abatacept; codeine; latex; shellfish allergy; and penicillins.  MEDICATIONS:  Current Outpatient Medications  Medication Sig Dispense Refill  . Acetaminophen (TYLENOL EXTRA STRENGTH PO) Take 650 mg by mouth. 2 tabs twice a day PRN    . albuterol (ACCUNEB) 1.25 MG/3ML nebulizer solution Take 1 ampule by nebulization every 6 (six) hours as needed for wheezing.    Marland Kitchen albuterol (PROVENTIL HFA;VENTOLIN HFA) 108 (90 Base) MCG/ACT inhaler Inhale 2 puffs into the lungs every 6 (six) hours as needed for wheezing or shortness of breath. 1 Inhaler 0  . aspirin EC  81 MG tablet Take 81 mg by mouth daily.    Marland Kitchen azelastine (ASTELIN) 0.1 % nasal spray Place 2 sprays into both nostrils 2 (two) times daily. 30 mL 5  . budesonide-formoterol (SYMBICORT) 160-4.5 MCG/ACT inhaler Inhale 2 puffs into the lungs 2 (two) times daily.    . calcium carbonate (OS-CAL - DOSED IN MG OF ELEMENTAL CALCIUM) 1250 (500 Ca) MG tablet Take 1 tablet by mouth daily with breakfast.     . cetirizine (ZYRTEC) 10 MG tablet Take 10 mg by mouth daily.    . Cholecalciferol (D3 ADULT PO) Take 1 capsule by mouth daily.    Marland Kitchen desloratadine (CLARINEX) 5 MG tablet Take 5 mg by mouth daily.    Marland Kitchen docusate sodium (COLACE) 50 MG capsule Take 50 mg by mouth 2 (two) times daily.    . ferrous sulfate 325 (65 FE) MG EC tablet Take 1 tablet (325 mg total) by mouth 2 (two) times daily with a meal. 60 tablet 1  . fluticasone (FLONASE) 50 MCG/ACT nasal spray Place 1 spray into both nostrils daily.     Marland Kitchen gabapentin (NEURONTIN) 100 MG capsule  TAKE 1 CAPSULE BY MOUTH THREE TIMES DAILY 90 capsule 0  . ipratropium (ATROVENT) 0.02 % nebulizer solution Take 0.5 mg by nebulization every 4 (four) hours as needed.     . leflunomide (ARAVA) 20 MG tablet Take 20 mg by mouth daily.    Marland Kitchen levothyroxine (SYNTHROID, LEVOTHROID) 88 MCG tablet Take 88 mcg by mouth daily before breakfast.    . lidocaine-prilocaine (EMLA) cream Apply to affected area once 30 g 3  . lisinopril (PRINIVIL,ZESTRIL) 40 MG tablet Take 40 mg by mouth daily.    . metoprolol succinate (TOPROL-XL) 50 MG 24 hr tablet Take 50 mg by mouth daily. Take with or immediately following a meal.    . montelukast (SINGULAIR) 10 MG tablet Take 10 mg by mouth at bedtime.    Marland Kitchen omeprazole (PRILOSEC) 10 MG capsule Take 20 mg by mouth daily.    . pravastatin (PRAVACHOL) 40 MG tablet Take 40 mg by mouth daily.    . Prenatal Vit-Fe Fumarate-FA (MULTIVITAMIN-PRENATAL) 27-0.8 MG TABS tablet Take 1 tablet by mouth daily.     Marland Kitchen senna-docusate (SENOKOT-S) 8.6-50 MG tablet Take 1 tablet by mouth at bedtime as needed for mild constipation.     Marland Kitchen tiotropium (SPIRIVA) 18 MCG inhalation capsule Place 18 mcg into inhaler and inhale daily.    . traMADol (ULTRAM) 50 MG tablet Take 100 mg by mouth as needed (for rheumatoid arthritis).    . verapamil (CALAN) 120 MG tablet Take 240 mg by mouth 2 (two) times daily.    Marland Kitchen dexamethasone (DECADRON) 4 MG tablet Take 2 tablets by mouth once a day starting the day after chemotherapy. Continue for 3 days total. Take with food. (Patient not taking: Reported on 02/29/2020) 30 tablet 1  . EPINEPHrine 0.3 mg/0.3 mL IJ SOAJ injection Use as directed for severe allergic reaction. (Patient not taking: Reported on 01/30/2020) 2 Device 1  . fluconazole (DIFLUCAN) 150 MG tablet Take 1 tablet (150 mg) by mouth. Three days later, take second tablet for treatment of yeast infection. (Patient not taking: Reported on 03/14/2020) 2 tablet 0  . guaiFENesin-dextromethorphan (ROBITUSSIN DM) 100-10  MG/5ML syrup Take 5 mLs by mouth every 4 (four) hours as needed for cough. (Patient not taking: Reported on 02/20/2020) 118 mL 0  . olopatadine (PATANOL) 0.1 % ophthalmic solution Place 1 drop into both eyes 2 (two)  times daily. (Patient not taking: Reported on 01/09/2020) 5 mL 5  . prochlorperazine (COMPAZINE) 10 MG tablet Take 1 tablet (10 mg total) by mouth every 6 (six) hours as needed (Nausea or vomiting). (Patient not taking: Reported on 02/29/2020) 30 tablet 1   No current facility-administered medications for this visit.     PHYSICAL EXAMINATION: ECOG PERFORMANCE STATUS: 1 - Symptomatic but completely ambulatory  Physical Exam Constitutional:      General: She is not in acute distress. HENT:     Head: Normocephalic and atraumatic.  Eyes:     General: No scleral icterus. Cardiovascular:     Rate and Rhythm: Normal rate and regular rhythm.     Heart sounds: Normal heart sounds.  Pulmonary:     Effort: Pulmonary effort is normal. No respiratory distress.     Breath sounds: No wheezing.  Abdominal:     General: Bowel sounds are normal. There is no distension.     Palpations: Abdomen is soft.  Musculoskeletal:        General: No deformity. Normal range of motion.     Cervical back: Normal range of motion and neck supple.  Skin:    General: Skin is warm and dry.     Findings: No erythema or rash.  Neurological:     Mental Status: She is alert and oriented to person, place, and time. Mental status is at baseline.     Cranial Nerves: No cranial nerve deficit.     Coordination: Coordination normal.  Psychiatric:        Mood and Affect: Mood normal.     LABORATORY DATA:  I have reviewed the data as listed Lab Results  Component Value Date   WBC 7.1 03/14/2020   HGB 10.7 (L) 03/14/2020   HCT 34.5 (L) 03/14/2020   MCV 90.8 03/14/2020   PLT 247 03/14/2020   Recent Labs    01/30/20 0812 02/20/20 0819 03/14/20 1044  NA 138 141 142  K 3.6 3.5 4.0  CL 106 106 108  CO2  _0 GLUCOSE 123* 120* 117*  BUN _1 CREATININE 0.83 0.70 0.90  CALCIUM 9.1 9.3 9.5  GFRNONAA >60 >60 >60  GFRAA >60 >60 >60  PROT 7.7 7.8 7.9  ALBUMIN 3.9 3.8 4.0  AST _2 ALT _3 ALKPHOS 113 90 94  BILITOT 0.4 0.5 0.5   Iron/TIBC/Ferritin/ %Sat    Component Value Date/Time   IRON 31 11/07/2019 0906   TIBC 187 (L) 11/07/2019 0906   FERRITIN 204 11/07/2019 0906   IRONPCTSAT 17 11/07/2019 0906      RADIOGRAPHIC STUDIES: I have personally reviewed the radiological images as listed and agreed with the findings in the report.CT Chest W Contrast  Result Date: 03/12/2020 CLINICAL DATA:  Endometrial cancer. Restaging. EXAM: CT CHEST, ABDOMEN, AND PELVIS WITH CONTRAST TECHNIQUE: Multidetector CT imaging of the chest, abdomen and pelvis was performed following the standard protocol during bolus administration of intravenous contrast. CONTRAST:  128m OMNIPAQUE IOHEXOL 300 MG/ML  SOLN COMPARISON:  10/20/2019. CTA chest 11/13/2019 FINDINGS: CT CHEST FINDINGS Cardiovascular: The heart size is normal. No substantial pericardial effusion. Atherosclerotic calcification is noted in the wall of the thoracic aorta. Right Port-A-Cath tip is positioned in the distal SVC. Mediastinum/Nodes: No mediastinal lymphadenopathy. There is no hilar lymphadenopathy. The esophagus has normal imaging features. There is no axillary lymphadenopathy. Lungs/Pleura: Calcified granuloma noted posterior right upper lobe. 4 mm ground-glass nodule medial left lung  apex is unchanged. No suspicious pulmonary nodule or mass. No focal airspace consolidation. No pleural effusion. Musculoskeletal: No worrisome lytic or sclerotic osseous abnormality. CT ABDOMEN PELVIS FINDINGS Hepatobiliary: No suspicious focal abnormality within the liver parenchyma. Gallbladder is surgically absent. No intrahepatic or extrahepatic biliary dilation. Pancreas: No focal mass lesion. No dilatation of the main duct. No intraparenchymal  cyst. No peripancreatic edema. Spleen: No splenomegaly. No focal mass lesion. Adrenals/Urinary Tract: No adrenal nodule or mass. Kidneys unremarkable. No evidence for hydroureter. The urinary bladder appears normal for the degree of distention. Stomach/Bowel: Stomach is unremarkable. No gastric wall thickening. No evidence of outlet obstruction. Duodenum is normally positioned as is the ligament of Treitz. No small bowel wall thickening. No small bowel dilatation. The terminal ileum is normal. The appendix is normal. No gross colonic mass. No colonic wall thickening. Diverticular changes are noted in the left colon without evidence of diverticulitis. Vascular/Lymphatic: There is abdominal aortic atherosclerosis without aneurysm. There is no gastrohepatic or hepatoduodenal ligament lymphadenopathy. No retroperitoneal or mesenteric lymphadenopathy. The index 1.6 cm short axis left para-aortic node on the previous study now measures 0.4 cm. Prominent precaval node on the previous study measured 11 mm short axis in this lymph node now measures 3 mm short axis. No pelvic sidewall lymphadenopathy. Reproductive: The uterus is surgically absent. There is no adnexal mass. Other: No intraperitoneal free fluid. Musculoskeletal: Small paraumbilical hernia contains only fat. Tiny sclerotic focus L5 vertebral body is stable. IMPRESSION: 1. The multiple enlarged retroperitoneal and right iliac lymph nodes identified as new on the previous exam from 10/20/2019 have resolved completely in the interval. 2. Tiny ground-glass pulmonary nodule medial left lung apex is stable in the interval. Likely benign, continued attention on follow-up recommended. 3. No new or progressive interval findings. 4.  Aortic Atherosclerois (ICD10-170.0) Electronically Signed   By: Misty Stanley M.D.   On: 03/12/2020 12:20   CT Abdomen Pelvis W Contrast  Result Date: 03/12/2020 CLINICAL DATA:  Endometrial cancer. Restaging. EXAM: CT CHEST, ABDOMEN, AND  PELVIS WITH CONTRAST TECHNIQUE: Multidetector CT imaging of the chest, abdomen and pelvis was performed following the standard protocol during bolus administration of intravenous contrast. CONTRAST:  146m OMNIPAQUE IOHEXOL 300 MG/ML  SOLN COMPARISON:  10/20/2019. CTA chest 11/13/2019 FINDINGS: CT CHEST FINDINGS Cardiovascular: The heart size is normal. No substantial pericardial effusion. Atherosclerotic calcification is noted in the wall of the thoracic aorta. Right Port-A-Cath tip is positioned in the distal SVC. Mediastinum/Nodes: No mediastinal lymphadenopathy. There is no hilar lymphadenopathy. The esophagus has normal imaging features. There is no axillary lymphadenopathy. Lungs/Pleura: Calcified granuloma noted posterior right upper lobe. 4 mm ground-glass nodule medial left lung apex is unchanged. No suspicious pulmonary nodule or mass. No focal airspace consolidation. No pleural effusion. Musculoskeletal: No worrisome lytic or sclerotic osseous abnormality. CT ABDOMEN PELVIS FINDINGS Hepatobiliary: No suspicious focal abnormality within the liver parenchyma. Gallbladder is surgically absent. No intrahepatic or extrahepatic biliary dilation. Pancreas: No focal mass lesion. No dilatation of the main duct. No intraparenchymal cyst. No peripancreatic edema. Spleen: No splenomegaly. No focal mass lesion. Adrenals/Urinary Tract: No adrenal nodule or mass. Kidneys unremarkable. No evidence for hydroureter. The urinary bladder appears normal for the degree of distention. Stomach/Bowel: Stomach is unremarkable. No gastric wall thickening. No evidence of outlet obstruction. Duodenum is normally positioned as is the ligament of Treitz. No small bowel wall thickening. No small bowel dilatation. The terminal ileum is normal. The appendix is normal. No gross colonic mass. No colonic wall thickening. Diverticular  changes are noted in the left colon without evidence of diverticulitis. Vascular/Lymphatic: There is  abdominal aortic atherosclerosis without aneurysm. There is no gastrohepatic or hepatoduodenal ligament lymphadenopathy. No retroperitoneal or mesenteric lymphadenopathy. The index 1.6 cm short axis left para-aortic node on the previous study now measures 0.4 cm. Prominent precaval node on the previous study measured 11 mm short axis in this lymph node now measures 3 mm short axis. No pelvic sidewall lymphadenopathy. Reproductive: The uterus is surgically absent. There is no adnexal mass. Other: No intraperitoneal free fluid. Musculoskeletal: Small paraumbilical hernia contains only fat. Tiny sclerotic focus L5 vertebral body is stable. IMPRESSION: 1. The multiple enlarged retroperitoneal and right iliac lymph nodes identified as new on the previous exam from 10/20/2019 have resolved completely in the interval. 2. Tiny ground-glass pulmonary nodule medial left lung apex is stable in the interval. Likely benign, continued attention on follow-up recommended. 3. No new or progressive interval findings. 4.  Aortic Atherosclerois (ICD10-170.0) Electronically Signed   By: Misty Stanley M.D.   On: 03/12/2020 12:20    ASSESSMENT & PLAN:  1. Endometrial cancer ([place])   2. Neuropathy due to chemotherapeutic drug ([place])   3. Rheumatoid arthritis, involving unspecified site, unspecified whether rheumatoid factor present ([place])   4. Port-A-Cath in place   5. Anemia, unspecified type    #Recurrent endometrial cancer S/p 6 cycles of chemotherapy with carboplatin and Taxol. Labs are reviewed and discussed with patient. CA125 18.9.  CT images were independantly reviewed by me and discussed with patient.  Multiple enlarged retroperitoneal and right iliac nodes have resolved completely.  I discussed with Dr.Secord, according to data from GOG 258,  there were no differences in relapse-free survival fewer lower vaginal recurrences and fewer lower pelvic and para-aortic relapses with the addition of RT I will recommend  patient to establish care with radiation oncology for discussion of benefit and risk.  Will also present her case on Gynonc tumor board.    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 Continue  #Normocytic anemia, hemoglobin 10.7.  Stable.   #Rheumatoid arthritis, follow-up rheumatologist. # [place] cath, recommend port flush every 8 weeks. She prefer to have port flushed at [place].  Return of visit: to be determined.  Earlie Server, MD, PhD Hematology Oncology [facility] Pager- [phone] 03/15/2020

## 2020-03-16 ENCOUNTER — Telehealth: Payer: Self-pay

## 2020-03-16 DIAGNOSIS — C541 Malignant neoplasm of endometrium: Secondary | ICD-10-CM

## 2020-03-16 NOTE — Telephone Encounter (Signed)
Call placed to [name] regarding referral to radiation oncology to discuss the benefit and risk of radiation. She is open to this discussion. Referral placed. Radiation department will call her with appointment.

## 2020-03-21 ENCOUNTER — Other Ambulatory Visit: Payer: Self-pay | Admitting: Oncology

## 2020-03-27 ENCOUNTER — Encounter: Payer: Self-pay | Admitting: Radiation Oncology

## 2020-03-27 ENCOUNTER — Other Ambulatory Visit: Payer: Self-pay

## 2020-03-29 ENCOUNTER — Other Ambulatory Visit: Payer: Self-pay

## 2020-03-29 ENCOUNTER — Ambulatory Visit
Admission: RE | Admit: 2020-03-29 | Discharge: 2020-03-29 | Disposition: A | Discharge status: Home / Self Care | Payer: Medicare Other | Source: Ambulatory Visit | Attending: Radiation Oncology | Admitting: Radiation Oncology

## 2020-03-29 DIAGNOSIS — I998 Other disorder of circulatory system: Secondary | ICD-10-CM | POA: Insufficient documentation

## 2020-03-29 DIAGNOSIS — Z9221 Personal history of antineoplastic chemotherapy: Secondary | ICD-10-CM | POA: Diagnosis not present

## 2020-03-29 DIAGNOSIS — I10 Essential (primary) hypertension: Secondary | ICD-10-CM | POA: Diagnosis not present

## 2020-03-29 DIAGNOSIS — R590 Localized enlarged lymph nodes: Secondary | ICD-10-CM | POA: Diagnosis not present

## 2020-03-29 DIAGNOSIS — Z90722 Acquired absence of ovaries, bilateral: Secondary | ICD-10-CM | POA: Insufficient documentation

## 2020-03-29 DIAGNOSIS — E079 Disorder of thyroid, unspecified: Secondary | ICD-10-CM | POA: Diagnosis not present

## 2020-03-29 DIAGNOSIS — E1140 Type 2 diabetes mellitus with diabetic neuropathy, unspecified: Secondary | ICD-10-CM | POA: Diagnosis not present

## 2020-03-29 DIAGNOSIS — M791 Myalgia, unspecified site: Secondary | ICD-10-CM | POA: Insufficient documentation

## 2020-03-29 DIAGNOSIS — J45909 Unspecified asthma, uncomplicated: Secondary | ICD-10-CM | POA: Insufficient documentation

## 2020-03-29 DIAGNOSIS — Z982 Presence of cerebrospinal fluid drainage device: Secondary | ICD-10-CM | POA: Diagnosis not present

## 2020-03-29 DIAGNOSIS — Z79899 Other long term (current) drug therapy: Secondary | ICD-10-CM | POA: Diagnosis not present

## 2020-03-29 DIAGNOSIS — C541 Malignant neoplasm of endometrium: Secondary | ICD-10-CM

## 2020-03-29 DIAGNOSIS — M069 Rheumatoid arthritis, unspecified: Secondary | ICD-10-CM | POA: Diagnosis not present

## 2020-03-29 DIAGNOSIS — E7800 Pure hypercholesterolemia, unspecified: Secondary | ICD-10-CM | POA: Diagnosis not present

## 2020-03-29 DIAGNOSIS — E119 Type 2 diabetes mellitus without complications: Secondary | ICD-10-CM | POA: Diagnosis not present

## 2020-03-29 DIAGNOSIS — Z90710 Acquired absence of both cervix and uterus: Secondary | ICD-10-CM | POA: Diagnosis not present

## 2020-03-29 NOTE — Progress Notes (Signed)
Met with Sandra Fischer following consult with Dr. [name]. We will arrange for follow up with Dr. [name] and port flushes. She has follow up with gyn onc already arranged for June.

## 2020-03-29 NOTE — Consult Note (Signed)
NEW PATIENT EVALUATION  Name: Sandra Fischer  MRN: [id number]  Date:   03/29/2020     DOB: 10-26-1951   This 69 y.o. 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  REFERRING PHYSICIAN: Felipa Eth, MD  CHIEF COMPLAINT:  Chief Complaint  Patient presents with  . Endometrial cancer    initial consultation    DIAGNOSIS: The encounter diagnosis was Endometrial cancer ([place]).   PREVIOUS INVESTIGATIONS:  CT scans reviewed Pathology report reviewed Clinical notes reviewed  HPI: Patient is a 69 year old female who presented with postmenopausal bleeding.  She underwent a TAH/BSO with sentinel lymph node biopsy at [facility].  Tumor was 9.3 cm invading 99% of the myometrium with a positive right external iliac sentinel lymph node positive.  Washings were negative.  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  She was a FIGO stage III C1 MSI was intact HER-2/neu negative.  Patient was seen by Dr. Fransisca Connors and was recommended for cortex 3 regimen although patient declined.  She does have history of SVT with ejection fraction of 35 to 40%.  This was back in October 2019.  October 2020 CT scan showed disease recurrence with multiple enlarged retroperitoneal and right iliac lymph nodes.  Largest retroperitoneal lymph node measured approximately 1.9 x 1.6 cm.  She is undergone 6 cycles of chemotherapy consisting of carboplatinum Taxol which she tolerated extremely well.  Repeat CT scan showed complete response with multiple enlarged retroperitoneal and right iliac iliac lymph nodes completely resolving.  She is seen today for radiation oncology opinion.  She is no longer having vaginal bleeding she specifically denies any abdominal  pain or increased lower urinary tract symptoms.  She does have mild neuropathy.  PLANNED TREATMENT REGIMEN: Continued observation  PAST MEDICAL HISTORY:  has a past medical history of Asthma, Asthma, Asthma exacerbation (08/22/2016), Cancer ([place]), Collagen vascular disease ([place]), Diabetes mellitus without complication ([place]), Environmental allergies, Fibromyalgia, High cholesterol, Hypertension, RA (rheumatoid arthritis) ([place]), and Thyroid disease.    PAST SURGICAL HISTORY:  Past Surgical History:  Procedure Laterality Date  . CATARACT EXTRACTION    . CHOLECYSTECTOMY    . fibroids removed    . THYROIDECTOMY, PARTIAL    . tummy tuck      FAMILY HISTORY: family history includes Dementia in her mother; Diabetes in her mother; Hyperlipidemia in her mother; Hypertension in her mother.  SOCIAL HISTORY:  reports that she has never smoked. She has never used smokeless tobacco. She reports that she does not drink alcohol or use drugs.  ALLERGIES: Abatacept, Codeine, Latex, Shellfish allergy, and Penicillins  MEDICATIONS:  Current Outpatient Medications  Medication Sig Dispense Refill  . Acetaminophen (TYLENOL EXTRA STRENGTH PO) Take 650 mg by mouth. 2 tabs twice a day PRN    . albuterol (ACCUNEB) 1.25 MG/3ML nebulizer solution Take 1 ampule by nebulization every 6 (six) hours as needed for wheezing.    Marland Kitchen albuterol (PROVENTIL HFA;VENTOLIN HFA) 108 (90 Base) MCG/ACT inhaler Inhale 2 puffs into the lungs every 6 (six) hours as needed for wheezing or shortness of breath. 1 Inhaler 0  . aspirin EC 81 MG tablet Take 81 mg by mouth daily.    Marland Kitchen azelastine (ASTELIN) 0.1 % nasal spray Place 2 sprays into both nostrils 2 (two) times daily. 30 mL 5  . budesonide-formoterol (SYMBICORT)  160-4.5 MCG/ACT inhaler Inhale 2 puffs into the lungs 2 (two) times daily.    . calcium carbonate (OS-CAL - DOSED IN MG OF ELEMENTAL CALCIUM) 1250 (500 Ca) MG tablet Take 1 tablet by mouth daily with breakfast.     .  cetirizine (ZYRTEC) 10 MG tablet Take 10 mg by mouth daily.    . Cholecalciferol (D3 ADULT PO) Take 1 capsule by mouth daily.    Marland Kitchen desloratadine (CLARINEX) 5 MG tablet Take 5 mg by mouth daily.    Marland Kitchen docusate sodium (COLACE) 50 MG capsule Take 50 mg by mouth 2 (two) times daily.    Marland Kitchen EPINEPHrine 0.3 mg/0.3 mL IJ SOAJ injection Use as directed for severe allergic reaction. 2 Device 1  . FEROSUL 325 (65 Fe) MG tablet TAKE ONE TABLET BY MOUTH TWICE DAILY WITH A MEAL 60 tablet 1  . fluticasone (FLONASE) 50 MCG/ACT nasal spray Place 1 spray into both nostrils daily.     Marland Kitchen gabapentin (NEURONTIN) 100 MG capsule TAKE 1 CAPSULE BY MOUTH THREE TIMES DAILY 90 capsule 0  . guaiFENesin-dextromethorphan (ROBITUSSIN DM) 100-10 MG/5ML syrup Take 5 mLs by mouth every 4 (four) hours as needed for cough. 118 mL 0  . ipratropium (ATROVENT) 0.02 % nebulizer solution Take 0.5 mg by nebulization every 4 (four) hours as needed.     . leflunomide (ARAVA) 20 MG tablet Take 20 mg by mouth daily.    Marland Kitchen levothyroxine (SYNTHROID, LEVOTHROID) 88 MCG tablet Take 88 mcg by mouth daily before breakfast.    . lidocaine-prilocaine (EMLA) cream Apply to affected area once 30 g 3  . lisinopril (PRINIVIL,ZESTRIL) 40 MG tablet Take 40 mg by mouth daily.    . metoprolol succinate (TOPROL-XL) 50 MG 24 hr tablet Take 50 mg by mouth daily. Take with or immediately following a meal.    . montelukast (SINGULAIR) 10 MG tablet Take 10 mg by mouth at bedtime.    Marland Kitchen olopatadine (PATANOL) 0.1 % ophthalmic solution Place 1 drop into both eyes 2 (two) times daily. 5 mL 5  . omeprazole (PRILOSEC) 10 MG capsule Take 20 mg by mouth daily.    . pravastatin (PRAVACHOL) 40 MG tablet Take 40 mg by mouth daily.    . Prenatal Vit-Fe Fumarate-FA (MULTIVITAMIN-PRENATAL) 27-0.8 MG TABS tablet Take 1 tablet by mouth daily.     . prochlorperazine (COMPAZINE) 10 MG tablet Take 1 tablet (10 mg total) by mouth every 6 (six) hours as needed (Nausea or vomiting). 30  tablet 1  . senna-docusate (SENOKOT-S) 8.6-50 MG tablet Take 1 tablet by mouth at bedtime as needed for mild constipation.     Marland Kitchen tiotropium (SPIRIVA) 18 MCG inhalation capsule Place 18 mcg into inhaler and inhale daily.    . traMADol (ULTRAM) 50 MG tablet Take 100 mg by mouth as needed (for rheumatoid arthritis).    . verapamil (CALAN) 120 MG tablet Take 240 mg by mouth 2 (two) times daily.     No current facility-administered medications for this encounter.    ECOG PERFORMANCE STATUS:  0 - Asymptomatic  REVIEW OF SYSTEMS: Patient denies any weight loss, fatigue, weakness, fever, chills or night sweats. Patient denies any loss of vision, blurred vision. Patient denies any ringing  of the ears or hearing loss. No irregular heartbeat. Patient denies heart murmur or history of fainting. Patient denies any chest pain or pain radiating to her upper extremities. Patient denies any shortness of breath, difficulty breathing at night, cough or hemoptysis. Patient denies any swelling  in the lower legs. Patient denies any nausea vomiting, vomiting of blood, or coffee ground material in the vomitus. Patient denies any stomach pain. Patient states has had normal bowel movements no significant constipation or diarrhea. Patient denies any dysuria, hematuria or significant nocturia. Patient denies any problems walking, swelling in the joints or loss of balance. Patient denies any skin changes, loss of hair or loss of weight. Patient denies any excessive worrying or anxiety or significant depression. Patient denies any problems with insomnia. Patient denies excessive thirst, polyuria, polydipsia. Patient denies any swollen glands, patient denies easy bruising or easy bleeding. Patient denies any recent infections, allergies or URI. Patient "s visual fields have not changed significantly in recent time.   PHYSICAL EXAM: BP (!) (P) 175/107 (BP Location: Left Arm, Patient Position: Sitting)   Pulse (P) 96   Resp (P) 18    Wt (P) 187 lb 1.6 oz (84.9 kg)   BMI (P) 34.22 kg/m  Wheelchair-bound female obese in NAD.  Well-developed well-nourished patient in NAD. HEENT reveals PERLA, EOMI, discs not visualized.  Oral cavity is clear. No oral mucosal lesions are identified. Neck is clear without evidence of cervical or supraclavicular adenopathy. Lungs are clear to A&P. Cardiac examination is essentially unremarkable with regular rate and rhythm without murmur rub or thrill. Abdomen is benign with no organomegaly or masses noted. Motor sensory and DTR levels are equal and symmetric in the upper and lower extremities. Cranial nerves II through XII are grossly intact. Proprioception is intact. No peripheral adenopathy or edema is identified. No motor or sensory levels are noted. Crude visual fields are within normal range.  LABORATORY DATA: Pathology report reviewed    RADIOLOGY RESULTS: Serial CT scans reviewed   IMPRESSION: FIGO grade 3C endometrial and serous endometrial carcinoma with recurrence after surgery followed by systemic chemotherapy with complete response in 69 year old female  PLAN: Based on the Portec data do not see role for whole pelvic radiation therapy at this time.  Agree with Dr. [name] there is no increased overall survival or disease-free survival adding radiation therapy at this stage.  I would reserve radiation therapy again for salvage should she have progressive disease in the future.  Case will be discussed at her monthly GYN conference and should that change will relay that information to the patient.  Patient and husband both seem to comprehend my recommendations well.  I would like to take this opportunity to thank you for allowing me to participate in the care of your patient.Noreene Filbert, MD

## 2020-03-30 ENCOUNTER — Telehealth: Payer: Self-pay

## 2020-03-30 DIAGNOSIS — C541 Malignant neoplasm of endometrium: Secondary | ICD-10-CM

## 2020-03-30 NOTE — Telephone Encounter (Signed)
Last MD visit was f/u TBD.  Dr. [name] would like patient to f/u in 3 months with labs cbc, cmp, ca125.

## 2020-04-11 ENCOUNTER — Other Ambulatory Visit: Payer: Medicare Other

## 2020-04-11 NOTE — Progress Notes (Signed)
Tumor Board Documentation  Sandra Fischer was presented by Mariea Clonts, RN at our Tumor Board on 04/11/2020, which included representatives from pathology, gynecology, gyn onc, medical oncology, and nursing.   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 She had been offered adjuvant treatment based on PORTEC3 regimen but declined. She had recurrent disease followed by 6 cycles of carbo-taxol chemotherapy with apparent complete response.   Additionally, we reviewed previous medical and familial history, history of present illness, and recent lab results along with all available histopathologic and imaging studies. The tumor board considered available treatment options and made the following recommendations: - we discussed that the results of GOG258 should the chemotherapy plus radiation was not associated with a longer relapse free survival than chemotherapy alone in patients with stage III or IVa endometrial carcinoma.  Based on this, adjuvant radiation was not recommended.  Radiation oncology, medical oncology, gynecologic oncology were in agreement.  Based on her HER-2 negativity and MSI stable, no recommendation for Herceptin or immunotherapy.  Could consider Lenvima and Keytruda for second line therapy however, if she has progressive disease.  The following procedures/referrals were also placed: No orders of the defined types were placed in this encounter. no benefit to overall survival with pelvic radiation per medical oncology and radiation oncology.   Clinical Trial Status:   Not on trial  Staging used: Pathologic Stage  National site-specific guidelines   were discussed with respect to the case.  Tumor board is a meeting of clinicians from various specialty areas who evaluate and discuss patients for whom a multidisciplinary  approach is being considered. Final determinations in the plan of care are those of the provider(s). The responsibility for follow up of recommendations given during tumor board is that of the provider.   Today's extended care, comprehensive team conference, Sandra Fischer was not present for the discussion and was not examined.

## 2020-04-16 ENCOUNTER — Telehealth: Payer: Self-pay | Admitting: *Deleted

## 2020-04-16 NOTE — Telephone Encounter (Signed)
No restriction thx

## 2020-04-16 NOTE — Telephone Encounter (Signed)
Patient called asking for Dr Collie Siad advice on which COVID vaccine she should get, [place] or [employer]. Please advise

## 2020-04-17 NOTE — Telephone Encounter (Signed)
Call returned to patient and advised either one would be fine. She thanked me for calling her back

## 2020-04-30 ENCOUNTER — Other Ambulatory Visit: Payer: Self-pay

## 2020-04-30 ENCOUNTER — Inpatient Hospital Stay: Payer: Medicare Other | Attending: Oncology

## 2020-04-30 ENCOUNTER — Ambulatory Visit (INDEPENDENT_AMBULATORY_CARE_PROVIDER_SITE_OTHER): Payer: Medicare Other | Admitting: Podiatry

## 2020-04-30 ENCOUNTER — Encounter: Payer: Self-pay | Admitting: Podiatry

## 2020-04-30 VITALS — BP 167/98 | HR 92 | Resp 16

## 2020-04-30 DIAGNOSIS — Z452 Encounter for adjustment and management of vascular access device: Secondary | ICD-10-CM | POA: Diagnosis not present

## 2020-04-30 DIAGNOSIS — Z95828 Presence of other vascular implants and grafts: Secondary | ICD-10-CM

## 2020-04-30 DIAGNOSIS — C541 Malignant neoplasm of endometrium: Secondary | ICD-10-CM | POA: Insufficient documentation

## 2020-04-30 DIAGNOSIS — M79675 Pain in left toe(s): Secondary | ICD-10-CM

## 2020-04-30 DIAGNOSIS — T451X5A Adverse effect of antineoplastic and immunosuppressive drugs, initial encounter: Secondary | ICD-10-CM

## 2020-04-30 DIAGNOSIS — G620 Drug-induced polyneuropathy: Secondary | ICD-10-CM | POA: Diagnosis not present

## 2020-04-30 DIAGNOSIS — B351 Tinea unguium: Secondary | ICD-10-CM

## 2020-04-30 DIAGNOSIS — M79674 Pain in right toe(s): Secondary | ICD-10-CM | POA: Diagnosis not present

## 2020-04-30 MED ORDER — SODIUM CHLORIDE 0.9% FLUSH
10.0000 mL | Freq: Once | INTRAVENOUS | Status: AC
  Administered 2020-04-30: 10 mL via INTRAVENOUS
  Filled 2020-04-30: qty 10

## 2020-04-30 MED ORDER — HEPARIN SOD (PORK) LOCK FLUSH 100 UNIT/ML IV SOLN
500.0000 [IU] | Freq: Once | INTRAVENOUS | Status: AC
  Administered 2020-04-30: 500 [IU] via INTRAVENOUS
  Filled 2020-04-30: qty 5

## 2020-04-30 MED ORDER — HEPARIN SOD (PORK) LOCK FLUSH 100 UNIT/ML IV SOLN
INTRAVENOUS | Status: AC
  Filled 2020-04-30: qty 5

## 2020-05-01 NOTE — Progress Notes (Signed)
This patient returns to my office for at risk foot care.  This patient requires this care by a professional since this patient will be at risk due to having diet controlled diabetes. She has been going for oncology visits which has led to neuropoathy.  Patient also says she is interested in diabetic shoes.  This patient is unable to cut nails herself since the patient cannot reach her nails.These nails are painful walking and wearing shoes.  This patient presents for at risk foot care today.  General Appearance  Alert, conversant and in no acute stress.  Vascular  Dorsalis pedis and posterior tibial  pulses are palpable  bilaterally.  Capillary return is within normal limits  bilaterally. Temperature is within normal limits  bilaterally.  Neurologic  Senn-Weinstein monofilament wire test diminished   bilaterally. Muscle power within normal limits bilaterally.  Nails Thick disfigured discolored nails with subungual debris  from hallux to fifth toes bilaterally. No evidence of bacterial infection or drainage bilaterally.  Orthopedic  No limitations of motion  feet .  No crepitus or effusions noted.  No bony pathology or digital deformities noted.  HAV  B/L.  Skin  normotropic skin with no porokeratosis noted bilaterally.  No signs of infections or ulcers noted.     Onychomycosis  Pain in right toes  Pain in left toes  Diabetes    Consent was obtained for treatment procedures.   Mechanical debridement of nails 1-5  bilaterally performed with a nail nipper.  Filed with dremel without incident.  Her doctor is Dr.  Arnoldo Morale at the [facility].   Return office visit   3 months                   Told patient to return for periodic foot care and evaluation due to potential at risk complications.   Gardiner Barefoot DPM

## 2020-05-21 ENCOUNTER — Telehealth: Payer: Self-pay | Admitting: *Deleted

## 2020-05-21 ENCOUNTER — Other Ambulatory Visit: Payer: Self-pay | Admitting: Oncology

## 2020-05-21 NOTE — Telephone Encounter (Signed)
Patient called reporting that she is completely out of her Gabapentin and that she needs a refill which was sent from pharmacy

## 2020-05-24 NOTE — Telephone Encounter (Signed)
Refill was sent

## 2020-05-30 ENCOUNTER — Inpatient Hospital Stay: Payer: Medicare Other | Attending: Oncology | Admitting: Obstetrics and Gynecology

## 2020-05-30 ENCOUNTER — Other Ambulatory Visit: Payer: Self-pay | Admitting: *Deleted

## 2020-05-30 ENCOUNTER — Other Ambulatory Visit: Payer: Self-pay

## 2020-05-30 ENCOUNTER — Ambulatory Visit: Payer: Medicare Other | Admitting: Orthotics

## 2020-05-30 VITALS — BP 149/89 | HR 104 | Temp 99.8°F | Wt 176.3 lb

## 2020-05-30 DIAGNOSIS — N898 Other specified noninflammatory disorders of vagina: Secondary | ICD-10-CM | POA: Diagnosis not present

## 2020-05-30 DIAGNOSIS — E039 Hypothyroidism, unspecified: Secondary | ICD-10-CM | POA: Diagnosis not present

## 2020-05-30 DIAGNOSIS — Z7952 Long term (current) use of systemic steroids: Secondary | ICD-10-CM | POA: Diagnosis not present

## 2020-05-30 DIAGNOSIS — Z90710 Acquired absence of both cervix and uterus: Secondary | ICD-10-CM

## 2020-05-30 DIAGNOSIS — K219 Gastro-esophageal reflux disease without esophagitis: Secondary | ICD-10-CM | POA: Insufficient documentation

## 2020-05-30 DIAGNOSIS — M797 Fibromyalgia: Secondary | ICD-10-CM | POA: Diagnosis not present

## 2020-05-30 DIAGNOSIS — Z9221 Personal history of antineoplastic chemotherapy: Secondary | ICD-10-CM | POA: Insufficient documentation

## 2020-05-30 DIAGNOSIS — E1140 Type 2 diabetes mellitus with diabetic neuropathy, unspecified: Secondary | ICD-10-CM | POA: Insufficient documentation

## 2020-05-30 DIAGNOSIS — I10 Essential (primary) hypertension: Secondary | ICD-10-CM | POA: Diagnosis not present

## 2020-05-30 DIAGNOSIS — I700 Atherosclerosis of aorta: Secondary | ICD-10-CM | POA: Diagnosis not present

## 2020-05-30 DIAGNOSIS — R911 Solitary pulmonary nodule: Secondary | ICD-10-CM | POA: Insufficient documentation

## 2020-05-30 DIAGNOSIS — C541 Malignant neoplasm of endometrium: Secondary | ICD-10-CM | POA: Diagnosis not present

## 2020-05-30 DIAGNOSIS — E7800 Pure hypercholesterolemia, unspecified: Secondary | ICD-10-CM | POA: Diagnosis not present

## 2020-05-30 DIAGNOSIS — Z90722 Acquired absence of ovaries, bilateral: Secondary | ICD-10-CM | POA: Diagnosis not present

## 2020-05-30 DIAGNOSIS — M069 Rheumatoid arthritis, unspecified: Secondary | ICD-10-CM | POA: Insufficient documentation

## 2020-05-30 LAB — WET PREP, GENITAL
Clue Cells Wet Prep HPF POC: NONE SEEN
Sperm: NONE SEEN
Trich, Wet Prep: NONE SEEN
Yeast Wet Prep HPF POC: NONE SEEN

## 2020-05-30 NOTE — Progress Notes (Signed)
t

## 2020-05-30 NOTE — Progress Notes (Signed)
Patient here today for follow up regarding endometrial cancer. Patient reports she feels like she has a yeast infection, reports itching and intermittent discharge.

## 2020-05-30 NOTE — Progress Notes (Addendum)
Gynecologic Oncology Interval Visit   Referring Provider: Dr. Amalia Hailey  Chief Complaint: Stage IIIC-1 serous endometrial cancer  Subjective:  Sandra Fischer is a 69 y.o. G2P0 female (s/p laparotomy myomectomy for leiomyoma), initially seen in consultation from Dr. 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   She presents today for surveillance. She complaints of persistent discharge and that it itches. She has been treated for BV and using douches for the odor. She has had decreased appetite since chemotherapy.   03/12/2020 CT Abdomen/pelvis IMPRESSION: 1. The multiple enlarged retroperitoneal and right iliac lymph nodes identified as new on the previous exam from 10/20/2019 have resolved completely in the interval. 2. Tiny ground-glass pulmonary nodule medial left lung apex is stable in the interval. Likely benign, continued attention on follow-up recommended. 3. No new or progressive interval findings. 4.  Aortic Atherosclerois (ICD10-170.0)   Gynecologic Oncology History:  She initially presented to Dr. Amalia Hailey as [phone] female with complaint of postmenopausal bleeding, intermittent, heavy at times.    Transabdominal ultrasound on 05/05/2019 demonstrated endometrium measuring 15m. Uterus anteverted measuring 11.3 x 6.6 x 7.9 cm, heterogeous echo texture w/o evidence of focal masses. Within uterus multiple suspected fibroids measuring 7.2 x 4.8 x 6.6 cm and 2.6 x 1.7 x 3.1 cm. Ovaries not visualized. No adnexal masses. No free fluid in cul de sac.   Endometrial biopsy was performed on 05/20/2019 and demonstrated high-grade mixed endometrioid, predominantly, and serous carcinoma.  She complains of persistent bleeding and fatigue which she attributes to the bleeding. She presents today for management. She has significant medical issues including rheumatoid arthritis requiring  chronic steroids (5-10 mg daily) for a long time (she does not know how long) and leflunomide (ARAVA). She also has undergone myomectomy for leiomyoma and abdominoplasty.  She also has a history of cardiac disease. She Cardioversion for SVT on 10/17/2018. Her cardiologist Dr. [name] [facility] pain and shortness of breath, noted to be in SVT, converted to sinus rhythm with adenosine,in the setting of colitis, anemia with hemoglobin 8.5, and untreated hypothyroidism with TSH 25, which has since returned to normal range.2D echocardiogram revealed moderately reduced LV function with LVEF 35 to 40% with diffuse hypokinesis. The patient underwent Lexiscan Myoview on 01/19/2019, which revealed revealed LVEF 49%, a mild fixed inferior wall defect, scar versus artifact, with no evidence of ischemia. She was last seen on 04/26/2019 by Cardiology ([name] with a plan to stay on her current medications and she was counseled about low sodium diet, DASH, and continuing hyperlipidemia medications.   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 She is married and provides care for her mother.   06/14/2019 CT C/A/P IMPRESSION: 1. Bulky fibroid uterus. There is no direct noncontrast CT evidence of endometrial malignancy. No evidence of lymphadenopathy or metastatic disease in the chest, abdomen, or pelvis. Please note that noncontrast CT is limited for the evaluation of solid organ metastases. 2. Stable 4 mm ground-glass pulmonary nodule of the left pulmonary apex (series 3, image 24). Although nonspecific, this is an unlikely isolated manifestation of metastatic disease. Attention on follow-up. 3. Chronic, incidental, and postoperative findings as detailed above.  She underwent TLH_BSO with [name] 06/21/2019.   Tumor size 9.3 cm, invading 99% of myometrium (3.55 of 3.6 cm), positive right external iliac sentinel lymph node. Negative washings. MSS/MMR -  Intact/normal. HER2 -  negative at 28%/1+.   Case was discussed at [place] on 07/04/2019. Possible PORTEC3 regimen given subgroup analysis showing benefit in serous history vs clinical trial was discussed.   Based on pathology, adjuvant chemotherapy & radiation was recommended. She saw Dr. [name] on 07/14/2019 and lengthy discussion regarding rationale of adjuvant treatment. She declined adjuvant treatment.  10/20/2019- CT Chest Abdomen Pelvis W Contrast 1.  Status post interval hysterectomy. 2. There are multiple newly enlarged retroperitoneal and right iliac lymph nodes, the largest left retroperitoneal node measuring 1.9 x 1.6 cm (series 2, image 71), concerning for nodal metastatic disease. 3. Unchanged 4 mm ground-glass pulmonary nodule of the left pulmonary apex (series 3, image 24). This remains nonspecific although is again an unlikely manifestation of pulmonary metastatic disease. 4. Other chronic, incidental, and postoperative findings as above. Aortic Atherosclerosis (ICD10-I70.0).  Findings consistent with recurrent disease with adenopathy.   11/07/2019-02/20/20 She agreed to treatment and received 6 cycles of carbo-taxol chemotherapy  (dose reduced d/t neuropathy).     Problem List: Patient Active Problem List   Diagnosis Date Noted  . Vaginal discharge 05/30/2020  . Encounter for antineoplastic chemotherapy 01/30/2020  . Neuropathy due to chemotherapeutic drug ([place]) 01/30/2020  . Anemia 01/30/2020  . Gastroesophageal reflux disease 12/08/2019  . Microcytic anemia 11/07/2019  . Port-A-Cath in place 11/04/2019  . Rheumatoid arthritis ([place]) 10/29/2019  . Goals of care, counseling/discussion 07/20/2019  . Endometrial cancer ([place]) 07/15/2019  . Acute blood loss anemia 06/22/2019  . Hypothyroidism, adult 06/15/2019  . Myalgia and myositis 06/15/2019  . Fibromyalgia   . Cardiomyopathy ([place]) 12/06/2018  . Atypical chest pain 12/06/2018  . Perennial and seasonal allergic  rhinitis 02/09/2018  . Moderate persistent asthma 02/09/2018  . Allergic conjunctivitis 02/09/2018  . History of food allergy 02/09/2018  . Allergic reaction 02/09/2018  . RA (rheumatoid arthritis) ([place]) 08/22/2016  . HTN (hypertension) 08/22/2016    Past Medical History: Past Medical History:  Diagnosis Date  . Asthma   . Asthma   . Asthma exacerbation 08/22/2016  . Cancer ([place])   . Collagen vascular disease ([place])   . Diabetes mellitus without complication ([place])    History of Diabetes   . Environmental allergies   . Fibromyalgia   . High cholesterol   . Hypertension   . RA (rheumatoid arthritis) ([place])   . Thyroid disease     Past Surgical History: Past Surgical History:  Procedure Laterality Date  . CATARACT EXTRACTION    . CHOLECYSTECTOMY    . fibroids removed    . THYROIDECTOMY, PARTIAL    . tummy tuck      Past Gynecologic History:  As per HPI  OB History:  OB History  Gravida Para Term Preterm AB Living  2       2    SAB TAB Ectopic Multiple Live Births  2            # Outcome Date GA Lbr Len/2nd Weight Sex Delivery Anes PTL Lv  2 SAB           1 SAB             Family History: Family History  Problem Relation Age of Onset  . Diabetes Mother   . Hypertension Mother   . Hyperlipidemia Mother   . Dementia Mother   . Breast cancer Neg Hx   . Ovarian cancer Neg Hx   . Colon cancer Neg Hx     Social History: Social History  Socioeconomic History  . Marital status: Married    Spouse name: Not on file  . Number of children: Not on file  . Years of education: Not on file  . Highest education level: Not on file  Occupational History  . Not on file  Tobacco Use  . Smoking status: Never Smoker  . Smokeless tobacco: Never Used  Substance and Sexual Activity  . Alcohol use: No  . Drug use: No  . Sexual activity: Yes    Birth control/protection: Post-menopausal  Other Topics Concern  . Not on file  Social History Narrative  . Not on file    Social Determinants of Health   Financial Resource Strain:   . Difficulty of Paying Living Expenses:   Food Insecurity:   . Worried About Charity fundraiser in the Last Year:   . Arboriculturist in the Last Year:   Transportation Needs:   . Film/video editor (Medical):   Marland Kitchen Lack of Transportation (Non-Medical):   Physical Activity:   . Days of Exercise per Week:   . Minutes of Exercise per Session:   Stress:   . Feeling of Stress :   Social Connections:   . Frequency of Communication with Friends and Family:   . Frequency of Social Gatherings with Friends and Family:   . Attends Religious Services:   . Active Member of Clubs or Organizations:   . Attends Archivist Meetings:   Marland Kitchen Marital Status:   Intimate Partner Violence:   . Fear of Current or Ex-Partner:   . Emotionally Abused:   Marland Kitchen Physically Abused:   . Sexually Abused:     Allergies: Allergies  Allergen Reactions  . Abatacept Hives  . Codeine Hives, Nausea And Vomiting and Nausea Only  . Latex Itching and Hives  . Shellfish Allergy Swelling  . Penicillins Nausea And Vomiting    Current Medications: Current Outpatient Medications  Medication Sig Dispense Refill  . Acetaminophen (TYLENOL EXTRA STRENGTH PO) Take 650 mg by mouth. 2 tabs twice a day PRN    . albuterol (ACCUNEB) 1.25 MG/3ML nebulizer solution Take 1 ampule by nebulization every 6 (six) hours as needed for wheezing.    Marland Kitchen albuterol (PROVENTIL HFA;VENTOLIN HFA) 108 (90 Base) MCG/ACT inhaler Inhale 2 puffs into the lungs every 6 (six) hours as needed for wheezing or shortness of breath. 1 Inhaler 0  . aspirin EC 81 MG tablet Take 81 mg by mouth daily.    Marland Kitchen azelastine (ASTELIN) 0.1 % nasal spray Place 2 sprays into both nostrils 2 (two) times daily. 30 mL 5  . budesonide-formoterol (SYMBICORT) 160-4.5 MCG/ACT inhaler Inhale 2 puffs into the lungs 2 (two) times daily.    . calcium carbonate (OS-CAL - DOSED IN MG OF ELEMENTAL CALCIUM) 1250  (500 Ca) MG tablet Take 1 tablet by mouth daily with breakfast.     . cetirizine (ZYRTEC) 10 MG tablet Take 10 mg by mouth daily.    . Cholecalciferol (D3 ADULT PO) Take 1 capsule by mouth daily.    Marland Kitchen desloratadine (CLARINEX) 5 MG tablet Take 5 mg by mouth daily.    Marland Kitchen docusate sodium (COLACE) 50 MG capsule Take 50 mg by mouth 2 (two) times daily.    Marland Kitchen EPINEPHrine 0.3 mg/0.3 mL IJ SOAJ injection Use as directed for severe allergic reaction. 2 Device 1  . FEROSUL 325 (65 Fe) MG tablet TAKE ONE TABLET BY MOUTH TWICE DAILY WITH A MEAL 60 tablet 1  .  fluticasone (FLONASE) 50 MCG/ACT nasal spray Place 1 spray into both nostrils daily.     Marland Kitchen gabapentin (NEURONTIN) 100 MG capsule TAKE 1 CAPSULE BY MOUTH THREE TIMES DAILY (Patient taking differently: 4 (four) times daily. ) 90 capsule 0  . guaiFENesin-dextromethorphan (ROBITUSSIN DM) 100-10 MG/5ML syrup Take 5 mLs by mouth every 4 (four) hours as needed for cough. 118 mL 0  . ipratropium (ATROVENT) 0.02 % nebulizer solution Take 0.5 mg by nebulization every 4 (four) hours as needed.     . leflunomide (ARAVA) 20 MG tablet Take 20 mg by mouth daily.    Marland Kitchen levothyroxine (SYNTHROID, LEVOTHROID) 88 MCG tablet Take 88 mcg by mouth daily before breakfast.    . lidocaine-prilocaine (EMLA) cream Apply to affected area once 30 g 3  . lisinopril (PRINIVIL,ZESTRIL) 40 MG tablet Take 20 mg by mouth in the morning and at bedtime.     . metoprolol succinate (TOPROL-XL) 50 MG 24 hr tablet Take 50 mg by mouth daily. Take with or immediately following a meal.    . montelukast (SINGULAIR) 10 MG tablet Take 10 mg by mouth at bedtime.    Marland Kitchen omeprazole (PRILOSEC) 10 MG capsule Take 20 mg by mouth daily.    . pravastatin (PRAVACHOL) 40 MG tablet Take 40 mg by mouth daily.    . Prenatal Vit-Fe Fumarate-FA (MULTIVITAMIN-PRENATAL) 27-0.8 MG TABS tablet Take 1 tablet by mouth daily.     . prochlorperazine (COMPAZINE) 10 MG tablet Take 1 tablet (10 mg total) by mouth every 6 (six)  hours as needed (Nausea or vomiting). 30 tablet 1  . senna-docusate (SENOKOT-S) 8.6-50 MG tablet Take 1 tablet by mouth at bedtime as needed for mild constipation.     Marland Kitchen tiotropium (SPIRIVA) 18 MCG inhalation capsule Place 18 mcg into inhaler and inhale daily.    . traMADol (ULTRAM) 50 MG tablet Take 100 mg by mouth as needed (for rheumatoid arthritis).    . verapamil (CALAN) 120 MG tablet Take 240 mg by mouth 2 (two) times daily.    Marland Kitchen olopatadine (PATANOL) 0.1 % ophthalmic solution Place 1 drop into both eyes 2 (two) times daily. (Patient not taking: Reported on 05/30/2020) 5 mL 5   No current facility-administered medications for this visit.   Review of Systems General:  Decrease appetite o/w no complaints Skin: no complaints Eyes: no complaints HEENT: allergies Breasts: no complaints Pulmonary: no complaints Cardiac: no complaints Gastrointestinal: no complaints Genitourinary: no complaints Ob/Gyn: vaginal discharge Musculoskeletal: no complaints Hematology: no complaints Neurologic/Psych: numbness & tingling   Objective:  Physical Examination:  BP (!) 149/89 (BP Location: Left Arm, Patient Position: Sitting, Cuff Size: Normal)   Pulse (!) 104   Temp 99.8 F (37.7 C) (Tympanic)   Wt 176 lb 4.8 oz (80 kg)   SpO2 97%   BMI 32.25 kg/m     ECOG Performance Status: 1 - Symptomatic but completely ambulatory   GENERAL: Patient is a well appearing female in no acute distress HEENT:  PERRL, neck supple with midline trachea.  NODES:  No cervical, supraclavicular, axillary, or inguinal lymphadenopathy palpated.  LUNGS:  Clear to auscultation bilaterally.   HEART:  Regular rate and rhythm. ABDOMEN:  Soft, nontender, nondistended. No ascites or hernias, or masses. MSK:  No focal spinal tenderness to palpation. Full range of motion bilaterally in the lower extremities. EXTREMITIES:  No peripheral edema.   NEURO:  Nonfocal. Well oriented.  Appropriate affect.  Pelvic: exam  chaperoned by CMA EGBUS: no lesions Cervix: surgically  absent Vagina: positive for yellowish small amount of discharge; no lesions, or bleeding Uterus: surgically absent BME: no palpable masses Rectovaginal: confirmatory   Labs: Wet prep ordered  Radiologic Imaging:  No imaging on site today     Assessment:  Sandra Fischer is a 69 y.o. 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  Tumor size 9.3 cm, invading 99% of myometrium (3.55 of 3.6cm), positive right external iliac sentinel lymph nodes. Negative washings. Recurrent disease 10/20/2019 with adenopathy up to 1.9 x 1.6 cm with CR to chemotherapy with paclitaxel/carboplatin.   Pulmonary nodule, uncertain etiology.  Grade 2 peripheral neuropathy.    Medical co-morbidities complicating care: She has significant medical issues including rheumatoid arthritis requiring chronic steroids (5-10 mg daily) for a long time (she does not know how long) and leflunomide (ARAVA); HTN; Dilated cardiomyopathy; hyperlipidemia; multiple intra-abdominal surgeries (myomectomy for leiomyoma, cholecystectomy, and abdominoplasty) ; obesity Body mass index is 32.25 kg/m.  Plan:   Problem List Items Addressed This Visit      Genitourinary   Endometrial cancer ([place]) - Primary     Other   Vaginal discharge       We ordered a WET prep today and treat accordingly.    She is scheduled visit with Dr. [name] in July. Recommend follow up imaging for pulmonary nodule and assess for disease. Plan to follow up with Gyn Oncology in 3 months.   I have recommended continued close follow up with exams, including pelvic exams every 3-6 months for 2-3 years, then every 6-12 months for 3-5 years and then annually thereafter.  Imaging assessment given recurrent disease is reasonable.    Kenae Lindquist Gaetana Michaelis,  MD

## 2020-06-01 ENCOUNTER — Ambulatory Visit: Payer: Medicare Other | Admitting: Orthotics

## 2020-06-01 ENCOUNTER — Other Ambulatory Visit: Payer: Self-pay

## 2020-06-06 ENCOUNTER — Telehealth: Payer: Self-pay | Admitting: *Deleted

## 2020-06-06 NOTE — Telephone Encounter (Signed)
We did a wet prep on her and she did not have any yeast present. She recommended that she follow up with her regular gyn for this chronic vaginal discharge.

## 2020-06-06 NOTE — Telephone Encounter (Signed)
Patient called asking if Dr Theora Gianotti would order her something for a yeast infection. Please advise

## 2020-06-06 NOTE — Telephone Encounter (Signed)
Patient infomred of RN response and advised to see her GYN, she states that she does not have one, but will get one

## 2020-06-26 ENCOUNTER — Encounter: Payer: Self-pay | Admitting: Obstetrics and Gynecology

## 2020-06-26 ENCOUNTER — Other Ambulatory Visit: Payer: Self-pay

## 2020-06-26 ENCOUNTER — Ambulatory Visit (INDEPENDENT_AMBULATORY_CARE_PROVIDER_SITE_OTHER): Payer: Medicare Other | Admitting: Obstetrics and Gynecology

## 2020-06-26 VITALS — BP 130/76 | HR 72 | Ht 62.0 in | Wt 178.0 lb

## 2020-06-26 DIAGNOSIS — N898 Other specified noninflammatory disorders of vagina: Secondary | ICD-10-CM

## 2020-06-26 NOTE — Progress Notes (Signed)
Patient comes in today for possible BV. Patient is having vaginal itching and odor. She had wet prep 05/30/20 and stated that she was not treated.

## 2020-06-26 NOTE — Progress Notes (Signed)
HPI:      [name] is a 69 y.o. G2P0020 who LMP was No LMP recorded. Patient is postmenopausal.  Subjective:   She presents today stating that she saw her GYN oncologist (Dr. Theora Fischer) who said she saw some yeast and possibly some bacterial vaginosis but didn't think treatment was necessary.  Patient then called the office approximately a week later and was told that she should see her regular OB/GYN if she had any further problems.  This occurred approximately 1 month ago.  Patient states that she is not having significant vaginal discharge and she has found a new powder she likes to use and does not seem to have the odor she did before.  She just "wants to be checked and make sure nothing is wrong". Of significant note she has a history of endometrial cancer and underwent hysterectomy and chemotherapy. She states that her case has been reviewed and radiation oncology does not think radiation is necessary.    Hx: The following portions of the patient's history were reviewed and updated as appropriate:             She  has a past medical history of Asthma, Asthma, Asthma exacerbation (08/22/2016), Cancer ([place]), Collagen vascular disease ([place]), Diabetes mellitus without complication ([place]), Environmental allergies, Fibromyalgia, High cholesterol, Hypertension, RA (rheumatoid arthritis) ([place]), and Thyroid disease. She does not have any pertinent problems on file. She  has a past surgical history that includes Cholecystectomy; Cataract extraction; Thyroidectomy, partial; fibroids removed; and tummy tuck. Her family history includes Dementia in her mother; Diabetes in her mother; Hyperlipidemia in her mother; Hypertension in her mother. She  reports that she has never smoked. She has never used smokeless tobacco. She reports that she does not drink alcohol and does not use drugs. She has a current medication list which includes the following prescription(s): acetaminophen, albuterol, albuterol,  aspirin ec, azelastine, budesonide-formoterol, calcium carbonate, cetirizine, cholecalciferol, desloratadine, docusate sodium, epinephrine, ferosul, fluticasone, gabapentin, guaifenesin-dextromethorphan, ipratropium, leflunomide, levothyroxine, lidocaine-prilocaine, lisinopril, metoprolol succinate, montelukast, olopatadine, omeprazole, pravastatin, multivitamin-prenatal, prochlorperazine, senna-docusate, tiotropium, tramadol, and verapamil. She is allergic to abatacept, codeine, latex, shellfish allergy, and penicillins.       Review of Systems:  Review of Systems  Constitutional: Denied constitutional symptoms, night sweats, recent illness, fatigue, fever, insomnia and weight loss.  Eyes: Denied eye symptoms, eye pain, photophobia, vision change and visual disturbance.  Ears/Nose/Throat/Neck: Denied ear, nose, throat or neck symptoms, hearing loss, nasal discharge, sinus congestion and sore throat.  Cardiovascular: Denied cardiovascular symptoms, arrhythmia, chest pain/pressure, edema, exercise intolerance, orthopnea and palpitations.  Respiratory: Denied pulmonary symptoms, asthma, pleuritic pain, productive sputum, cough, dyspnea and wheezing.  Gastrointestinal: Denied, gastro-esophageal reflux, melena, nausea and vomiting.  Genitourinary: Denied genitourinary symptoms including symptomatic vaginal discharge, pelvic relaxation issues, and urinary complaints.  Musculoskeletal: Denied musculoskeletal symptoms, stiffness, swelling, muscle weakness and myalgia.  Dermatologic: Denied dermatology symptoms, rash and scar.  Neurologic: Denied neurology symptoms, dizziness, headache, neck pain and syncope.  Psychiatric: Denied psychiatric symptoms, anxiety and depression.  Endocrine: Denied endocrine symptoms including hot flashes and night sweats.   Meds:   Current Outpatient Medications on File Prior to Visit  Medication Sig Dispense Refill  . Acetaminophen (TYLENOL EXTRA STRENGTH PO) Take 650 mg  by mouth. 2 tabs twice a day PRN    . albuterol (ACCUNEB) 1.25 MG/3ML nebulizer solution Take 1 ampule by nebulization every 6 (six) hours as needed for wheezing.    Marland Kitchen albuterol (PROVENTIL HFA;VENTOLIN HFA) 108 (90 Base) MCG/ACT  inhaler Inhale 2 puffs into the lungs every 6 (six) hours as needed for wheezing or shortness of breath. 1 Inhaler 0  . aspirin EC 81 MG tablet Take 81 mg by mouth daily.    Marland Kitchen azelastine (ASTELIN) 0.1 % nasal spray Place 2 sprays into both nostrils 2 (two) times daily. 30 mL 5  . budesonide-formoterol (SYMBICORT) 160-4.5 MCG/ACT inhaler Inhale 2 puffs into the lungs 2 (two) times daily.    . calcium carbonate (OS-CAL - DOSED IN MG OF ELEMENTAL CALCIUM) 1250 (500 Ca) MG tablet Take 1 tablet by mouth daily with breakfast.     . cetirizine (ZYRTEC) 10 MG tablet Take 10 mg by mouth daily.    . Cholecalciferol (D3 ADULT PO) Take 1 capsule by mouth daily.    Marland Kitchen desloratadine (CLARINEX) 5 MG tablet Take 5 mg by mouth daily.    Marland Kitchen docusate sodium (COLACE) 50 MG capsule Take 50 mg by mouth 2 (two) times daily.    Marland Kitchen EPINEPHrine 0.3 mg/0.3 mL IJ SOAJ injection Use as directed for severe allergic reaction. 2 Device 1  . FEROSUL 325 (65 Fe) MG tablet TAKE ONE TABLET BY MOUTH TWICE DAILY WITH A MEAL 60 tablet 1  . fluticasone (FLONASE) 50 MCG/ACT nasal spray Place 1 spray into both nostrils daily.     Marland Kitchen gabapentin (NEURONTIN) 100 MG capsule TAKE 1 CAPSULE BY MOUTH THREE TIMES DAILY (Patient taking differently: 4 (four) times daily. ) 90 capsule 0  . guaiFENesin-dextromethorphan (ROBITUSSIN DM) 100-10 MG/5ML syrup Take 5 mLs by mouth every 4 (four) hours as needed for cough. 118 mL 0  . ipratropium (ATROVENT) 0.02 % nebulizer solution Take 0.5 mg by nebulization every 4 (four) hours as needed.     . leflunomide (ARAVA) 20 MG tablet Take 20 mg by mouth daily.    Marland Kitchen levothyroxine (SYNTHROID, LEVOTHROID) 88 MCG tablet Take 88 mcg by mouth daily before breakfast.    . lidocaine-prilocaine  (EMLA) cream Apply to affected area once 30 g 3  . lisinopril (PRINIVIL,ZESTRIL) 40 MG tablet Take 20 mg by mouth in the morning and at bedtime.     . metoprolol succinate (TOPROL-XL) 50 MG 24 hr tablet Take 50 mg by mouth daily. Take with or immediately following a meal.    . montelukast (SINGULAIR) 10 MG tablet Take 10 mg by mouth at bedtime.    Marland Kitchen olopatadine (PATANOL) 0.1 % ophthalmic solution Place 1 drop into both eyes 2 (two) times daily. (Patient not taking: Reported on 05/30/2020) 5 mL 5  . omeprazole (PRILOSEC) 10 MG capsule Take 20 mg by mouth daily.    . pravastatin (PRAVACHOL) 40 MG tablet Take 40 mg by mouth daily.    . Prenatal Vit-Fe Fumarate-FA (MULTIVITAMIN-PRENATAL) 27-0.8 MG TABS tablet Take 1 tablet by mouth daily.     . prochlorperazine (COMPAZINE) 10 MG tablet Take 1 tablet (10 mg total) by mouth every 6 (six) hours as needed (Nausea or vomiting). 30 tablet 1  . senna-docusate (SENOKOT-S) 8.6-50 MG tablet Take 1 tablet by mouth at bedtime as needed for mild constipation.     Marland Kitchen tiotropium (SPIRIVA) 18 MCG inhalation capsule Place 18 mcg into inhaler and inhale daily.    . traMADol (ULTRAM) 50 MG tablet Take 100 mg by mouth as needed (for rheumatoid arthritis).    . verapamil (CALAN) 120 MG tablet Take 240 mg by mouth 2 (two) times daily.     No current facility-administered medications on file prior to visit.  Objective:     Vitals:   06/26/20 1506  BP: 130/76  Pulse: 72              Physical examination   Pelvic:   Vulva: Normal appearance.  No lesions.  Powdered  Vagina: No lesions or abnormalities noted.  Support: Normal pelvic support.  Urethra No masses tenderness or scarring.  Meatus Normal size without lesions or prolapse.     Anus: Normal exam.  No lesions.  Perineum: Normal exam.  No lesions.   WET PREP: clue cells: absent, KOH (yeast): negative, odor: absent and trichomoniasis: negative Ph:  < 4.5   Assessment:    G2P0020 Patient Active  Problem List   Diagnosis Date Noted  . Vaginal discharge 05/30/2020  . Encounter for antineoplastic chemotherapy 01/30/2020  . 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 Anemia 01/30/2020  . Gastroesophageal reflux disease 12/08/2019  . Microcytic anemia 11/07/2019  . Port-A-Cath in place 11/04/2019  . Rheumatoid arthritis ([place]) 10/29/2019  . Goals of care, counseling/discussion 07/20/2019  . Endometrial cancer ([place]) 07/15/2019  . Acute blood loss anemia 06/22/2019  . Hypothyroidism, adult 06/15/2019  . Myalgia and myositis 06/15/2019  . Fibromyalgia   . Cardiomyopathy ([place]) 12/06/2018  . Atypical chest pain 12/06/2018  . Perennial and seasonal allergic rhinitis 02/09/2018  . Moderate persistent asthma 02/09/2018  . Allergic conjunctivitis 02/09/2018  . History of food allergy 02/09/2018  . Allergic reaction 02/09/2018  . RA (rheumatoid arthritis) ([place]) 08/22/2016  . HTN (hypertension) 08/22/2016     1. Vaginal discharge     No evidence of vaginal infection.   Plan:            1.  Patient happy with the above results.  She is to contact us if her symptoms resume. Orders No orders of the defined types were placed in this encounter.   No orders of the defined types were placed in this encounter.     F/U  Return for Pt to contact us if symptoms worsen. I spent 25 minutes involved in the care of this patient preparing to see the patient by obtaining and reviewing her medical history (including labs, imaging tests and prior procedures), documenting clinical information in the electronic health record (EHR), counseling and coordinating care plans, writing and sending prescriptions, ordering tests or procedures and directly communicating with the patient by discussing pertinent items from her history and physical exam as well as detailing my assessment and plan as noted above so that she has an informed understanding.  All of her questions were answered.  Finis Bud, M.D. 06/26/2020 3:48 PM

## 2020-06-27 ENCOUNTER — Ambulatory Visit: Payer: Medicare Other | Admitting: Orthotics

## 2020-06-27 DIAGNOSIS — B351 Tinea unguium: Secondary | ICD-10-CM

## 2020-06-27 DIAGNOSIS — D3610 Benign neoplasm of peripheral nerves and autonomic nervous system, unspecified: Secondary | ICD-10-CM

## 2020-06-27 DIAGNOSIS — G620 Drug-induced polyneuropathy: Secondary | ICD-10-CM

## 2020-06-27 DIAGNOSIS — M79674 Pain in right toe(s): Secondary | ICD-10-CM

## 2020-06-27 NOTE — Progress Notes (Signed)
Shoes not here; Dr. Selmer [facility] hasn't sgine off on them.

## 2020-07-03 ENCOUNTER — Inpatient Hospital Stay: Payer: Medicare Other | Attending: Oncology

## 2020-07-03 ENCOUNTER — Other Ambulatory Visit: Payer: Self-pay

## 2020-07-03 ENCOUNTER — Encounter: Payer: Self-pay | Admitting: Oncology

## 2020-07-03 ENCOUNTER — Inpatient Hospital Stay (HOSPITAL_BASED_OUTPATIENT_CLINIC_OR_DEPARTMENT_OTHER): Payer: Medicare Other | Admitting: Oncology

## 2020-07-03 VITALS — BP 160/88 | HR 76 | Temp 96.8°F | Resp 18 | Wt 177.6 lb

## 2020-07-03 DIAGNOSIS — Z95828 Presence of other vascular implants and grafts: Secondary | ICD-10-CM

## 2020-07-03 DIAGNOSIS — C541 Malignant neoplasm of endometrium: Secondary | ICD-10-CM | POA: Diagnosis not present

## 2020-07-03 DIAGNOSIS — M069 Rheumatoid arthritis, unspecified: Secondary | ICD-10-CM | POA: Insufficient documentation

## 2020-07-03 DIAGNOSIS — G620 Drug-induced polyneuropathy: Secondary | ICD-10-CM | POA: Insufficient documentation

## 2020-07-03 DIAGNOSIS — D509 Iron deficiency anemia, unspecified: Secondary | ICD-10-CM | POA: Insufficient documentation

## 2020-07-03 DIAGNOSIS — D649 Anemia, unspecified: Secondary | ICD-10-CM

## 2020-07-03 DIAGNOSIS — Z452 Encounter for adjustment and management of vascular access device: Secondary | ICD-10-CM | POA: Insufficient documentation

## 2020-07-03 DIAGNOSIS — T451X5A Adverse effect of antineoplastic and immunosuppressive drugs, initial encounter: Secondary | ICD-10-CM

## 2020-07-03 HISTORY — DX: Iron deficiency anemia, unspecified: D50.9

## 2020-07-03 LAB — CBC WITH DIFFERENTIAL/PLATELET
Abs Immature Granulocytes: 0.02 10*3/uL (ref 0.00–0.07)
Basophils Absolute: 0 10*3/uL (ref 0.0–0.1)
Basophils Relative: 1 %
Eosinophils Absolute: 0.4 10*3/uL (ref 0.0–0.5)
Eosinophils Relative: 8 %
HCT: 29.7 % — ABNORMAL LOW (ref 36.0–46.0)
Hemoglobin: 9.2 g/dL — ABNORMAL LOW (ref 12.0–15.0)
Immature Granulocytes: 0 %
Lymphocytes Relative: 38 %
Lymphs Abs: 2.1 10*3/uL (ref 0.7–4.0)
MCH: 24.1 pg — ABNORMAL LOW (ref 26.0–34.0)
MCHC: 31 g/dL (ref 30.0–36.0)
MCV: 78 fL — ABNORMAL LOW (ref 80.0–100.0)
Monocytes Absolute: 0.6 10*3/uL (ref 0.1–1.0)
Monocytes Relative: 10 %
Neutro Abs: 2.4 10*3/uL (ref 1.7–7.7)
Neutrophils Relative %: 43 %
Platelets: 342 10*3/uL (ref 150–400)
RBC: 3.81 MIL/uL — ABNORMAL LOW (ref 3.87–5.11)
RDW: 18 % — ABNORMAL HIGH (ref 11.5–15.5)
WBC: 5.6 10*3/uL (ref 4.0–10.5)
nRBC: 0 % (ref 0.0–0.2)

## 2020-07-03 LAB — COMPREHENSIVE METABOLIC PANEL
ALT: 9 U/L (ref 0–44)
AST: 15 U/L (ref 15–41)
Albumin: 3.4 g/dL — ABNORMAL LOW (ref 3.5–5.0)
Alkaline Phosphatase: 77 U/L (ref 38–126)
Anion gap: 9 (ref 5–15)
BUN: 12 mg/dL (ref 8–23)
CO2: 26 mmol/L (ref 22–32)
Calcium: 8.6 mg/dL — ABNORMAL LOW (ref 8.9–10.3)
Chloride: 104 mmol/L (ref 98–111)
Creatinine, Ser: 0.64 mg/dL (ref 0.44–1.00)
GFR calc Af Amer: >60 mL/min (ref >60)
GFR calc non Af Amer: >60 mL/min (ref >60)
Glucose, Bld: 170 mg/dL — ABNORMAL HIGH (ref 70–99)
Potassium: 3.1 mmol/L — ABNORMAL LOW (ref 3.5–5.1)
Sodium: 139 mmol/L (ref 135–145)
Total Bilirubin: 0.4 mg/dL (ref 0.3–1.2)
Total Protein: 8.3 g/dL — ABNORMAL HIGH (ref 6.5–8.1)

## 2020-07-03 LAB — IRON AND TIBC
Iron: 33 ug/dL (ref 28–170)
Saturation Ratios: 19 % (ref 10.4–31.8)
TIBC: 178 ug/dL — ABNORMAL LOW (ref 250–450)
UIBC: 145 ug/dL

## 2020-07-03 LAB — RETIC PANEL
Immature Retic Fract: 27.9 % — ABNORMAL HIGH (ref 2.3–15.9)
RBC.: 3.87 MIL/uL (ref 3.87–5.11)
Retic Count, Absolute: 44.1 10*3/uL (ref 19.0–186.0)
Retic Ct Pct: 1.1 % (ref 0.4–3.1)
Reticulocyte Hemoglobin: 24.6 pg — ABNORMAL LOW (ref >27.9)

## 2020-07-03 LAB — FERRITIN: Ferritin: 264 ng/mL (ref 11–307)

## 2020-07-03 MED ORDER — HEPARIN SOD (PORK) LOCK FLUSH 100 UNIT/ML IV SOLN
INTRAVENOUS | Status: AC
  Filled 2020-07-03: qty 5

## 2020-07-03 MED ORDER — HEPARIN SOD (PORK) LOCK FLUSH 100 UNIT/ML IV SOLN
500.0000 [IU] | Freq: Once | INTRAVENOUS | Status: AC
  Administered 2020-07-03: 500 [IU] via INTRAVENOUS
  Filled 2020-07-03: qty 5

## 2020-07-03 MED ORDER — POTASSIUM CHLORIDE CRYS ER 20 MEQ PO TBCR
20.0000 meq | EXTENDED_RELEASE_TABLET | Freq: Every day | ORAL | 0 refills | Status: DC
Start: 2020-07-03 — End: 2021-05-15

## 2020-07-03 MED ORDER — GABAPENTIN 300 MG PO CAPS
300.0000 mg | ORAL_CAPSULE | Freq: Three times a day (TID) | ORAL | 1 refills | Status: DC
Start: 2020-07-03 — End: 2024-07-12

## 2020-07-03 MED ORDER — SODIUM CHLORIDE 0.9% FLUSH
10.0000 mL | INTRAVENOUS | Status: DC | PRN
  Filled 2020-07-03: qty 10

## 2020-07-03 NOTE — Progress Notes (Signed)
Hematology/Oncology follow up note [facility] [phone] [phone]   Patient Care Team: Felipa Eth, MD as PCP - General (Internal Medicine) Clent Jacks, RN as Oncology Nurse Navigator Noreene Filbert, MD as Radiation Oncologist (Radiation Oncology)  REFERRING PROVIDER: Felipa Eth, MD  CHIEF COMPLAINTS/REASON FOR VISIT:  Follow up for endometrial cancer HISTORY OF PRESENTING ILLNESS:   Sandra Fischer is a  69 y.o.  female with PMH listed below was seen in consultation at the request of  Felipa Eth, MD  for evaluation of endometrial cancer Patient had TH/BSO sentinel lymph node biopsy by [facility] GYN oncology Dr. Allen Norris on 06/21/2019. Extensive medical records from [place] system review was performed by me.  Tumor size 9.3 cm, invading 99% of myometrium [3.55 out of 3.6 cm], positive right external iliac sentinel lymph nodes.  Negative washing.  No LVI, serosa is uninvolved. 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 pT1bpN30m  FIGO stage IIIC1 MSI intact HER 2 negative.   Patient was seen by Dr. BFransisca Connorson 07/06/2019 due to vaginal odor. Patient presents for establishing care and discussion for adjuvant chemotherapy radiation.  Per Dr. BBlake Divinenote, [facility].  Portex 3 regimen was recommended given subgroup analysis showing benefit in the serous histology versus clinical trial. HER2 was ordered and pending results.   Patient has a history of SVT.  10/14/2018 2D echo reviewed moderately reduced LV function with LVEF 35 to 40% with diffuse hypokinesis.  Patient had Lexiscan Myoview on 01/19/2019.  LVEF 49%.  SPECT images revealed a mild fixed inferior wall defect, scar versus artifact.  No evidence of ischemia.  She follows up with cardiology Dr.Paraschos 04/26/2019 with a plan to stay on her current medication. 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 She is married and providing care for her mother.  # 10/20/2019 CT chest abdomen pelvis with contrast showed disease recurrence.  There are multiple newly enlarged retroperitoneal and right iliac lymph nodes the largest the left retroperitoneal node measuring 1.9 x 1.6 cm concerning for nodal metastasis disease.  Unchanged 4 mm groundglass pulmonary nodule of the left pulmonary apex.  This remains nonspecific. #Cardiomyopathy, LVEF35 to 40% Last seen by cardiology 04/26/2019.  Recommend patient to continue follow-up with cardiology.  # GERD  ER visit on 11/13/2019 due to chest pain which started after he ate at a restaurant and developed burning sensation in her chest.  No associated dyspnea. Patient had a CTA which showed no pulmonary embolism.  Patient symptoms got better after GI cocktail.  Was considered to be secondary to acid reflux.  Patient takes omeprazole 20 mg daily.   # S/p 6 cycles of chemotherapy with carboplatin and Taxol. Labs are reviewed and discussed with patient. CA125 18.9.  CT images were independantly reviewed by me and discussed with patient.  Multiple enlarged retroperitoneal and right iliac nodes have resolved completely.  I discussed with Dr.Secord, according to data from GOG 258,  there were no differences in relapse-free survival fewer lower vaginal recurrences and fewer lower pelvic and para-aortic relapses with the addition of RT I will recommend patient to establish care with radiation oncology for discussion of benefit and risk.   INTERVAL HISTORY BNakiya Rallisis a 69y.o. female who has above history reviewed by me today presents for follow up visit for management of endometrial cancer. Problems and complaints are listed below: S/p 6 cycles of chemotherapy with carboplatin  and Taxol.   Postchemotherapy CT scan showed excellent treatment response. Today patient reports feeling fatigued. She takes iron tablets. Chronic  neuropathy of bilateral fingertips, on gabapentin 100 mg 4 times daily.  She feels the symptoms are not very well controlled. Denies hematochezia, hematuria, hematemesis, epistaxis, black tarry stool or easy bruising.    Review of Systems  Constitutional: Positive for fatigue. Negative for appetite change, chills and fever.  HENT:   Negative for hearing loss and voice change.   Eyes: Negative for eye problems.  Respiratory: Negative for chest tightness and cough.   Cardiovascular: Negative for chest pain.  Gastrointestinal: Negative for abdominal distention, abdominal pain and blood in stool.  Endocrine: Negative for hot flashes.  Genitourinary: Negative for difficulty urinating and frequency.   Musculoskeletal: Negative for arthralgias.  Skin: Negative for itching and rash.  Neurological: Positive for numbness. Negative for extremity weakness.  Hematological: Negative for adenopathy.  Psychiatric/Behavioral: Negative for confusion.    MEDICAL HISTORY:  Past Medical History:  Diagnosis Date  . Asthma   . Asthma   . Asthma exacerbation 08/22/2016  . Cancer ([place])   . Collagen vascular disease ([place])   . Diabetes mellitus without complication ([place])    History of Diabetes   . Environmental allergies   . Fibromyalgia   . High cholesterol   . Hypertension   . RA (rheumatoid arthritis) ([place])   . Thyroid disease     SURGICAL HISTORY: Past Surgical History:  Procedure Laterality Date  . CATARACT EXTRACTION    . CHOLECYSTECTOMY    . fibroids removed    . THYROIDECTOMY, PARTIAL    . tummy tuck      SOCIAL HISTORY: Social History   Socioeconomic History  . Marital status: Married    Spouse name: Not on file  . Number of children: Not on file  . Years of education: Not on file  . Highest education level: Not on file  Occupational History  . Not on file  Tobacco Use  . Smoking status: Never Smoker  . Smokeless tobacco: Never Used  Vaping Use  . Vaping Use: Never used   Substance and Sexual Activity  . Alcohol use: No  . Drug use: No  . Sexual activity: Yes    Birth control/protection: Post-menopausal  Other Topics Concern  . Not on file  Social History Narrative  . Not on file   Social Determinants of Health   Financial Resource Strain:   . Difficulty of Paying Living Expenses:   Food Insecurity:   . Worried About Charity fundraiser in the Last Year:   . Arboriculturist in the Last Year:   Transportation Needs:   . Film/video editor (Medical):   Marland Kitchen Lack of Transportation (Non-Medical):   Physical Activity:   . Days of Exercise per Week:   . Minutes of Exercise per Session:   Stress:   . Feeling of Stress :   Social Connections:   . Frequency of Communication with Friends and Family:   . Frequency of Social Gatherings with Friends and Family:   . Attends Religious Services:   . Active Member of Clubs or Organizations:   . Attends Archivist Meetings:   Marland Kitchen Marital Status:   Intimate Partner Violence:   . Fear of Current or Ex-Partner:   . Emotionally Abused:   Marland Kitchen Physically Abused:   . Sexually Abused:     FAMILY HISTORY: Family History  Problem Relation Age of Onset  .  Diabetes Mother   . Hypertension Mother   . Hyperlipidemia Mother   . Dementia Mother   . Breast cancer Neg Hx   . Ovarian cancer Neg Hx   . Colon cancer Neg Hx     ALLERGIES:  is allergic to abatacept, codeine, latex, shellfish allergy, and penicillins.  MEDICATIONS:  Current Outpatient Medications  Medication Sig Dispense Refill  . Acetaminophen (TYLENOL EXTRA STRENGTH PO) Take 650 mg by mouth. 2 tabs twice a day PRN    . albuterol (ACCUNEB) 1.25 MG/3ML nebulizer solution Take 1 ampule by nebulization every 6 (six) hours as needed for wheezing.    Marland Kitchen albuterol (PROVENTIL HFA;VENTOLIN HFA) 108 (90 Base) MCG/ACT inhaler Inhale 2 puffs into the lungs every 6 (six) hours as needed for wheezing or shortness of breath. 1 Inhaler 0  . aspirin EC 81  MG tablet Take 81 mg by mouth daily.    Marland Kitchen azelastine (ASTELIN) 0.1 % nasal spray Place 2 sprays into both nostrils 2 (two) times daily. 30 mL 5  . budesonide-formoterol (SYMBICORT) 160-4.5 MCG/ACT inhaler Inhale 2 puffs into the lungs 2 (two) times daily.    . calcium carbonate (OS-CAL - DOSED IN MG OF ELEMENTAL CALCIUM) 1250 (500 Ca) MG tablet Take 1 tablet by mouth daily with breakfast.     . cetirizine (ZYRTEC) 10 MG tablet Take 10 mg by mouth daily.    . Cholecalciferol (D3 ADULT PO) Take 1 capsule by mouth daily.    Marland Kitchen desloratadine (CLARINEX) 5 MG tablet Take 5 mg by mouth daily.    Marland Kitchen docusate sodium (COLACE) 50 MG capsule Take 50 mg by mouth 2 (two) times daily.    Marland Kitchen EPINEPHrine 0.3 mg/0.3 mL IJ SOAJ injection Use as directed for severe allergic reaction. 2 Device 1  . FEROSUL 325 (65 Fe) MG tablet TAKE ONE TABLET BY MOUTH TWICE DAILY WITH A MEAL 60 tablet 1  . fluticasone (FLONASE) 50 MCG/ACT nasal spray Place 1 spray into both nostrils daily.     Marland Kitchen gabapentin (NEURONTIN) 100 MG capsule TAKE 1 CAPSULE BY MOUTH THREE TIMES DAILY (Patient taking differently: 4 (four) times daily. ) 90 capsule 0  . guaiFENesin-dextromethorphan (ROBITUSSIN DM) 100-10 MG/5ML syrup Take 5 mLs by mouth every 4 (four) hours as needed for cough. 118 mL 0  . ipratropium (ATROVENT) 0.02 % nebulizer solution Take 0.5 mg by nebulization every 4 (four) hours as needed.     . leflunomide (ARAVA) 20 MG tablet Take 20 mg by mouth daily.    Marland Kitchen levothyroxine (SYNTHROID, LEVOTHROID) 88 MCG tablet Take 88 mcg by mouth daily before breakfast.    . lidocaine-prilocaine (EMLA) cream Apply to affected area once 30 g 3  . lisinopril (PRINIVIL,ZESTRIL) 40 MG tablet Take 20 mg by mouth in the morning and at bedtime.     . metoprolol succinate (TOPROL-XL) 50 MG 24 hr tablet Take 50 mg by mouth daily. Take with or immediately following a meal.    . montelukast (SINGULAIR) 10 MG tablet Take 10 mg by mouth at bedtime.    Marland Kitchen olopatadine  (PATANOL) 0.1 % ophthalmic solution Place 1 drop into both eyes 2 (two) times daily. (Patient not taking: Reported on 05/30/2020) 5 mL 5  . omeprazole (PRILOSEC) 10 MG capsule Take 20 mg by mouth daily.    . pravastatin (PRAVACHOL) 40 MG tablet Take 40 mg by mouth daily.    . Prenatal Vit-Fe Fumarate-FA (MULTIVITAMIN-PRENATAL) 27-0.8 MG TABS tablet Take 1 tablet by mouth daily.     Marland Kitchen  prochlorperazine (COMPAZINE) 10 MG tablet Take 1 tablet (10 mg total) by mouth every 6 (six) hours as needed (Nausea or vomiting). 30 tablet 1  . senna-docusate (SENOKOT-S) 8.6-50 MG tablet Take 1 tablet by mouth at bedtime as needed for mild constipation.     Marland Kitchen tiotropium (SPIRIVA) 18 MCG inhalation capsule Place 18 mcg into inhaler and inhale daily.    . traMADol (ULTRAM) 50 MG tablet Take 100 mg by mouth as needed (for rheumatoid arthritis).    . verapamil (CALAN) 120 MG tablet Take 240 mg by mouth 2 (two) times daily.     No current facility-administered medications for this visit.   Facility-Administered Medications Ordered in Other Visits  Medication Dose Route Frequency Provider Last Rate Last Admin  . sodium chloride flush (NS) 0.9 % injection 10 mL  10 mL Intravenous PRN Earlie Server, MD         PHYSICAL EXAMINATION: ECOG PERFORMANCE STATUS: 1 - Symptomatic but completely ambulatory  Physical Exam Constitutional:      General: She is not in acute distress. HENT:     Head: Normocephalic and atraumatic.  Eyes:     General: No scleral icterus. Cardiovascular:     Rate and Rhythm: Normal rate and regular rhythm.     Heart sounds: Normal heart sounds.  Pulmonary:     Effort: Pulmonary effort is normal. No respiratory distress.     Breath sounds: No wheezing.  Abdominal:     General: Bowel sounds are normal. There is no distension.     Palpations: Abdomen is soft.  Musculoskeletal:        General: No deformity. Normal range of motion.     Cervical back: Normal range of motion and neck supple.   Skin:    General: Skin is warm and dry.     Findings: No erythema or rash.  Neurological:     Mental Status: She is alert and oriented to person, place, and time. Mental status is at baseline.     Cranial Nerves: No cranial nerve deficit.     Coordination: Coordination normal.  Psychiatric:        Mood and Affect: Mood normal.     LABORATORY DATA:  I have reviewed the data as listed Lab Results  Component Value Date   WBC 5.6 07/03/2020   HGB 9.2 (L) 07/03/2020   HCT 29.7 (L) 07/03/2020   MCV 78.0 (L) 07/03/2020   PLT 342 07/03/2020   Recent Labs    01/30/20 0812 02/20/20 0819 03/14/20 1044  NA 138 141 142  K 3.6 3.5 4.0  CL 106 106 108  CO2 _0 GLUCOSE 123* 120* 117*  BUN _1 CREATININE 0.83 0.70 0.90  CALCIUM 9.1 9.3 9.5  GFRNONAA >60 >60 >60  GFRAA >60 >60 >60  PROT 7.7 7.8 7.9  ALBUMIN 3.9 3.8 4.0  AST _2 ALT _3 ALKPHOS 113 90 94  BILITOT 0.4 0.5 0.5   Iron/TIBC/Ferritin/ %Sat    Component Value Date/Time   IRON 31 11/07/2019 0906   TIBC 187 (L) 11/07/2019 0906   FERRITIN 204 11/07/2019 0906   IRONPCTSAT 17 11/07/2019 0906      RADIOGRAPHIC STUDIES: I have personally reviewed the radiological images as listed and agreed with the findings in the report., No results found.   ASSESSMENT & PLAN:  1. Endometrial cancer ([place])   2. Iron deficiency anemia, unspecified iron deficiency anemia type   3.  Neuropathy due to chemotherapeutic drug ([place])   4. Port-A-Cath in place    #Recurrent endometrial cancer Status post 6 cycles of chemotherapy with carboplatin and Taxol. Post chemotherapy CT scan showed excellent response. enlarged retroperitoneal and right iliac nodes have resolved completely.  CA-125 today is pending. Continue surveillance.  Clinical images as indicated. Continue follow-up with GYN oncology and radiation oncology.  #Chemotherapy-induced neuropathy, grade 2, I recommend patient to increase gabapentin to 300  mg 2-3 times daily.  Potential side effects were discussed with her.  #Microcytic anemia, I will add iron panel.  And a reticulocyte panel. Iron, TIBC is pending at the time of dictation.  Ferritin is 264, this may be falsely elevated due to rheumatoid arthritis.  Patient has microcytosis and also decreased reticulocyte hemoglobin level. I suspect that she has underlying iron deficiency anemia. I recommend IV Venofer 200 mg weekly x3. Allergy reactions/infusion reaction including anaphylactic reaction discussed with patient. Other side effects include but not limited to high blood pressure, skin rash, weight gain, leg swelling, etc. Patient voices understanding and willing to proceed.  Patient is waiting for colonoscopy examination. #Rheumatoid arthritis, follow-up rheumatologist.  She is going to start treatment. # Porta cath, recommend port flush every 8 weeks. She prefer to have port flushed at [place].  Return of visit: 2 months. Earlie Server, MD, PhD Hematology Oncology [facility] at [facility] Pager- [phone] 07/03/2020

## 2020-07-03 NOTE — Addendum Note (Signed)
Addended by: Earlie Server on: 07/03/2020 05:25 PM   Modules accepted: Orders

## 2020-07-03 NOTE — Progress Notes (Signed)
Patient reports she still has neuropathy pain that is 6/10 today.

## 2020-07-03 NOTE — Addendum Note (Signed)
Addended by: Oneida Arenas on: 07/03/2020 01:25 PM   Modules accepted: Orders

## 2020-07-04 ENCOUNTER — Telehealth: Payer: Self-pay

## 2020-07-04 LAB — CA 125: Cancer Antigen (CA) 125: 13.6 U/mL (ref 0.0–38.1)

## 2020-07-04 NOTE — Telephone Encounter (Signed)
Please arrange patient to have Venofer weekly x3 & notify pt of appts please. First venofer is *new*.

## 2020-07-04 NOTE — Telephone Encounter (Signed)
Done. Pt has been sched for *NEW* Venofer 1st dose as requested pt is aware of her scheduled appts dates and times.

## 2020-07-12 ENCOUNTER — Inpatient Hospital Stay: Payer: Medicare Other

## 2020-07-12 ENCOUNTER — Other Ambulatory Visit: Payer: Self-pay

## 2020-07-12 VITALS — BP 139/74 | HR 75 | Temp 97.2°F | Resp 16

## 2020-07-12 DIAGNOSIS — D509 Iron deficiency anemia, unspecified: Secondary | ICD-10-CM

## 2020-07-12 DIAGNOSIS — C541 Malignant neoplasm of endometrium: Secondary | ICD-10-CM | POA: Diagnosis not present

## 2020-07-12 MED ORDER — SODIUM CHLORIDE 0.9 % IV SOLN: 200.0000 mg | Freq: Once | INTRAVENOUS | Status: DC

## 2020-07-12 MED ORDER — IRON SUCROSE 20 MG/ML IV SOLN
200.0000 mg | Freq: Once | INTRAVENOUS | Status: AC
  Administered 2020-07-12: 200 mg via INTRAVENOUS
  Filled 2020-07-12: qty 10

## 2020-07-12 MED ORDER — SODIUM CHLORIDE 0.9 % IV SOLN
Freq: Once | INTRAVENOUS | Status: AC
  Filled 2020-07-12: qty 250

## 2020-07-12 MED ORDER — HEPARIN SOD (PORK) LOCK FLUSH 100 UNIT/ML IV SOLN
500.0000 [IU] | Freq: Once | INTRAVENOUS | Status: DC | PRN
  Filled 2020-07-12: qty 5

## 2020-07-12 MED ORDER — HEPARIN SOD (PORK) LOCK FLUSH 100 UNIT/ML IV SOLN
INTRAVENOUS | Status: AC
  Filled 2020-07-12: qty 5

## 2020-07-19 ENCOUNTER — Inpatient Hospital Stay: Payer: Medicare Other

## 2020-07-19 ENCOUNTER — Other Ambulatory Visit: Payer: Self-pay

## 2020-07-19 VITALS — BP 137/84 | HR 78 | Temp 98.5°F | Resp 20

## 2020-07-19 DIAGNOSIS — C541 Malignant neoplasm of endometrium: Secondary | ICD-10-CM | POA: Diagnosis not present

## 2020-07-19 DIAGNOSIS — D509 Iron deficiency anemia, unspecified: Secondary | ICD-10-CM

## 2020-07-19 MED ORDER — IRON SUCROSE 20 MG/ML IV SOLN
200.0000 mg | Freq: Once | INTRAVENOUS | Status: AC
  Administered 2020-07-19: 200 mg via INTRAVENOUS
  Filled 2020-07-19: qty 10

## 2020-07-19 MED ORDER — SODIUM CHLORIDE 0.9% FLUSH
10.0000 mL | Freq: Once | INTRAVENOUS | Status: AC | PRN
  Administered 2020-07-19: 10 mL
  Filled 2020-07-19: qty 10

## 2020-07-19 MED ORDER — HEPARIN SOD (PORK) LOCK FLUSH 100 UNIT/ML IV SOLN
INTRAVENOUS | Status: AC
  Filled 2020-07-19: qty 5

## 2020-07-19 MED ORDER — SODIUM CHLORIDE 0.9 % IV SOLN: 200.0000 mg | Freq: Once | INTRAVENOUS | Status: DC

## 2020-07-19 MED ORDER — SODIUM CHLORIDE 0.9 % IV SOLN
Freq: Once | INTRAVENOUS | Status: AC
  Filled 2020-07-19: qty 250

## 2020-07-19 MED ORDER — HEPARIN SOD (PORK) LOCK FLUSH 100 UNIT/ML IV SOLN
500.0000 [IU] | Freq: Once | INTRAVENOUS | Status: AC | PRN
  Administered 2020-07-19: 500 [IU]
  Filled 2020-07-19: qty 5

## 2020-07-26 ENCOUNTER — Other Ambulatory Visit: Payer: Self-pay

## 2020-07-26 ENCOUNTER — Inpatient Hospital Stay: Payer: Medicare Other

## 2020-07-26 VITALS — BP 130/82 | HR 65 | Temp 97.0°F | Resp 19

## 2020-07-26 DIAGNOSIS — C541 Malignant neoplasm of endometrium: Secondary | ICD-10-CM | POA: Diagnosis not present

## 2020-07-26 DIAGNOSIS — D509 Iron deficiency anemia, unspecified: Secondary | ICD-10-CM

## 2020-07-26 MED ORDER — IRON SUCROSE 20 MG/ML IV SOLN
200.0000 mg | Freq: Once | INTRAVENOUS | Status: AC
  Administered 2020-07-26: 200 mg via INTRAVENOUS
  Filled 2020-07-26: qty 10

## 2020-07-26 MED ORDER — HEPARIN SOD (PORK) LOCK FLUSH 100 UNIT/ML IV SOLN
500.0000 [IU] | Freq: Once | INTRAVENOUS | Status: AC | PRN
  Administered 2020-07-26: 500 [IU]
  Filled 2020-07-26: qty 5

## 2020-07-26 MED ORDER — HEPARIN SOD (PORK) LOCK FLUSH 100 UNIT/ML IV SOLN
INTRAVENOUS | Status: AC
  Filled 2020-07-26: qty 5

## 2020-07-26 MED ORDER — SODIUM CHLORIDE 0.9 % IV SOLN
Freq: Once | INTRAVENOUS | Status: AC
  Filled 2020-07-26: qty 250

## 2020-07-26 MED ORDER — SODIUM CHLORIDE 0.9 % IV SOLN: 200.0000 mg | Freq: Once | INTRAVENOUS | Status: DC

## 2020-08-02 ENCOUNTER — Encounter: Payer: Self-pay | Admitting: Podiatry

## 2020-08-02 ENCOUNTER — Other Ambulatory Visit: Payer: Self-pay

## 2020-08-02 ENCOUNTER — Ambulatory Visit (INDEPENDENT_AMBULATORY_CARE_PROVIDER_SITE_OTHER): Payer: Medicare Other | Admitting: Podiatry

## 2020-08-02 DIAGNOSIS — G620 Drug-induced polyneuropathy: Secondary | ICD-10-CM

## 2020-08-02 DIAGNOSIS — M79675 Pain in left toe(s): Secondary | ICD-10-CM

## 2020-08-02 DIAGNOSIS — M79674 Pain in right toe(s): Secondary | ICD-10-CM | POA: Diagnosis not present

## 2020-08-02 DIAGNOSIS — B351 Tinea unguium: Secondary | ICD-10-CM | POA: Diagnosis not present

## 2020-08-02 DIAGNOSIS — E1142 Type 2 diabetes mellitus with diabetic polyneuropathy: Secondary | ICD-10-CM

## 2020-08-02 DIAGNOSIS — E1140 Type 2 diabetes mellitus with diabetic neuropathy, unspecified: Secondary | ICD-10-CM | POA: Insufficient documentation

## 2020-08-02 NOTE — Progress Notes (Signed)
This patient returns to my office for at risk foot care.  This patient requires this care by a professional since this patient will be at risk due to having diet controlled diabetes.   This patient is unable to cut nails herself since the patient cannot reach her nails.These nails are painful walking and wearing shoes.  This patient presents for at risk foot care today.  General Appearance  Alert, conversant and in no acute stress.  Vascular  Dorsalis pedis and posterior tibial  pulses are palpable  bilaterally.  Capillary return is within normal limits  bilaterally. Temperature is within normal limits  bilaterally.  Neurologic  Senn-Weinstein monofilament wire test diminished   bilaterally. Muscle power within normal limits bilaterally.  Nails Thick disfigured discolored nails with subungual debris  from hallux to fifth toes bilaterally. No evidence of bacterial infection or drainage bilaterally.  Orthopedic  No limitations of motion  feet .  No crepitus or effusions noted.  No bony pathology or digital deformities noted.  HAV  B/L.  Skin  normotropic skin with no porokeratosis noted bilaterally.  No signs of infections or ulcers noted.     Onychomycosis  Pain in right toes  Pain in left toes  Diabetes    Consent was obtained for treatment procedures.   Mechanical debridement of nails 1-5  bilaterally performed with a nail nipper.  Filed with dremel without incident.     Return office visit   3 months                   Told patient to return for periodic foot care and evaluation due to potential at risk complications.   Gardiner Barefoot DPM

## 2020-08-20 ENCOUNTER — Other Ambulatory Visit: Payer: Self-pay | Admitting: Oncology

## 2020-08-21 ENCOUNTER — Inpatient Hospital Stay: Payer: Medicare Other | Attending: Oncology

## 2020-08-21 ENCOUNTER — Other Ambulatory Visit: Payer: Self-pay

## 2020-08-21 DIAGNOSIS — Z8542 Personal history of malignant neoplasm of other parts of uterus: Secondary | ICD-10-CM | POA: Diagnosis not present

## 2020-08-21 DIAGNOSIS — Z452 Encounter for adjustment and management of vascular access device: Secondary | ICD-10-CM | POA: Diagnosis not present

## 2020-08-21 DIAGNOSIS — D509 Iron deficiency anemia, unspecified: Secondary | ICD-10-CM

## 2020-08-21 MED ORDER — HEPARIN SOD (PORK) LOCK FLUSH 100 UNIT/ML IV SOLN
INTRAVENOUS | Status: AC
  Filled 2020-08-21: qty 5

## 2020-08-21 MED ORDER — HEPARIN SOD (PORK) LOCK FLUSH 100 UNIT/ML IV SOLN
500.0000 [IU] | Freq: Once | INTRAVENOUS | Status: AC
  Administered 2020-08-21: 500 [IU] via INTRAVENOUS
  Filled 2020-08-21: qty 5

## 2020-08-21 MED ORDER — SODIUM CHLORIDE 0.9% FLUSH
10.0000 mL | Freq: Once | INTRAVENOUS | Status: AC
  Administered 2020-08-21: 10 mL via INTRAVENOUS
  Filled 2020-08-21: qty 10

## 2020-08-24 ENCOUNTER — Telehealth: Payer: Self-pay | Admitting: Podiatry

## 2020-08-24 NOTE — Telephone Encounter (Signed)
Pt left message yesterday @ 215 asking for me to call her back.  I returned call and pt stated that the [facility] doctor will not sign off on the diabetic shoe paperwork. They are sending [facility] for [facility] to [facility] them. Pt was upset with them because it has taken them so long to let her know this. I told pt if there was anything we needed to do to please let us know and she said thank you.

## 2020-08-29 ENCOUNTER — Telehealth: Payer: Self-pay | Admitting: Obstetrics and Gynecology

## 2020-08-29 ENCOUNTER — Inpatient Hospital Stay: Payer: Medicare Other

## 2020-08-29 NOTE — Telephone Encounter (Signed)
Patient phoned on this date and stated that she needed to reschedule her appt for 08-29-20 as she already had another appt scheduled at this time. Appt rescheduled to 09-19-20.

## 2020-09-10 ENCOUNTER — Other Ambulatory Visit: Payer: Self-pay

## 2020-09-10 ENCOUNTER — Inpatient Hospital Stay: Payer: Medicare Other | Attending: Oncology

## 2020-09-10 DIAGNOSIS — C541 Malignant neoplasm of endometrium: Secondary | ICD-10-CM | POA: Diagnosis present

## 2020-09-10 DIAGNOSIS — D509 Iron deficiency anemia, unspecified: Secondary | ICD-10-CM | POA: Diagnosis not present

## 2020-09-10 DIAGNOSIS — M069 Rheumatoid arthritis, unspecified: Secondary | ICD-10-CM | POA: Insufficient documentation

## 2020-09-10 LAB — CBC WITH DIFFERENTIAL/PLATELET
Abs Immature Granulocytes: 0.03 10*3/uL (ref 0.00–0.07)
Basophils Absolute: 0 10*3/uL (ref 0.0–0.1)
Basophils Relative: 0 %
Eosinophils Absolute: 0.3 10*3/uL (ref 0.0–0.5)
Eosinophils Relative: 5 %
HCT: 34.9 % — ABNORMAL LOW (ref 36.0–46.0)
Hemoglobin: 10.9 g/dL — ABNORMAL LOW (ref 12.0–15.0)
Immature Granulocytes: 0 %
Lymphocytes Relative: 37 %
Lymphs Abs: 2.7 10*3/uL (ref 0.7–4.0)
MCH: 24.9 pg — ABNORMAL LOW (ref 26.0–34.0)
MCHC: 31.2 g/dL (ref 30.0–36.0)
MCV: 79.7 fL — ABNORMAL LOW (ref 80.0–100.0)
Monocytes Absolute: 0.8 10*3/uL (ref 0.1–1.0)
Monocytes Relative: 11 %
Neutro Abs: 3.4 10*3/uL (ref 1.7–7.7)
Neutrophils Relative %: 47 %
Platelets: 256 10*3/uL (ref 150–400)
RBC: 4.38 MIL/uL (ref 3.87–5.11)
RDW: 20.3 % — ABNORMAL HIGH (ref 11.5–15.5)
WBC: 7.3 10*3/uL (ref 4.0–10.5)
nRBC: 0 % (ref 0.0–0.2)

## 2020-09-10 LAB — COMPREHENSIVE METABOLIC PANEL
ALT: 14 U/L (ref 0–44)
AST: 16 U/L (ref 15–41)
Albumin: 3.5 g/dL (ref 3.5–5.0)
Alkaline Phosphatase: 75 U/L (ref 38–126)
Anion gap: 9 (ref 5–15)
BUN: 13 mg/dL (ref 8–23)
CO2: 25 mmol/L (ref 22–32)
Calcium: 8.7 mg/dL — ABNORMAL LOW (ref 8.9–10.3)
Chloride: 102 mmol/L (ref 98–111)
Creatinine, Ser: 0.66 mg/dL (ref 0.44–1.00)
GFR calc Af Amer: >60 mL/min (ref >60)
GFR calc non Af Amer: >60 mL/min (ref >60)
Glucose, Bld: 93 mg/dL (ref 70–99)
Potassium: 4 mmol/L (ref 3.5–5.1)
Sodium: 136 mmol/L (ref 135–145)
Total Bilirubin: 0.5 mg/dL (ref 0.3–1.2)
Total Protein: 7.8 g/dL (ref 6.5–8.1)

## 2020-09-10 LAB — IRON AND TIBC
Iron: 44 ug/dL (ref 28–170)
Saturation Ratios: 25 % (ref 10.4–31.8)
TIBC: 179 ug/dL — ABNORMAL LOW (ref 250–450)
UIBC: 135 ug/dL

## 2020-09-10 LAB — FERRITIN: Ferritin: 884 ng/mL — ABNORMAL HIGH (ref 11–307)

## 2020-09-11 ENCOUNTER — Encounter: Payer: Self-pay | Admitting: Oncology

## 2020-09-11 ENCOUNTER — Other Ambulatory Visit: Payer: Self-pay

## 2020-09-11 ENCOUNTER — Inpatient Hospital Stay: Payer: Medicare Other

## 2020-09-11 ENCOUNTER — Inpatient Hospital Stay (HOSPITAL_BASED_OUTPATIENT_CLINIC_OR_DEPARTMENT_OTHER): Payer: Medicare Other | Admitting: Oncology

## 2020-09-11 VITALS — BP 145/92 | HR 59 | Temp 97.9°F | Resp 18 | Wt 176.3 lb

## 2020-09-11 DIAGNOSIS — C541 Malignant neoplasm of endometrium: Secondary | ICD-10-CM

## 2020-09-11 DIAGNOSIS — D509 Iron deficiency anemia, unspecified: Secondary | ICD-10-CM | POA: Diagnosis not present

## 2020-09-11 DIAGNOSIS — Z95828 Presence of other vascular implants and grafts: Secondary | ICD-10-CM

## 2020-09-11 LAB — CA 125: Cancer Antigen (CA) 125: 11.2 U/mL (ref 0.0–38.1)

## 2020-09-11 NOTE — Progress Notes (Signed)
Pt here for follow up. No new concerns voiced.   

## 2020-09-11 NOTE — Progress Notes (Signed)
Hematology/Oncology follow up note [facility] [phone] [phone]   Patient Care Team: Sandra Eth, MD as PCP - General (Internal Medicine) Sandra Jacks, RN as Oncology Nurse Navigator Sandra Filbert, MD as Radiation Oncologist (Radiation Oncology)  REFERRING PROVIDER: Felipa Eth, MD  CHIEF COMPLAINTS/REASON FOR VISIT:  Follow up for endometrial cancer HISTORY OF PRESENTING ILLNESS:   Sandra Fischer is a  69 y.o.  female with PMH listed below was seen in consultation at the request of  Sandra Eth, MD  for evaluation of endometrial cancer Patient had TH/BSO sentinel lymph node biopsy by Sandra Fischer GYN oncology Sandra Fischer on 06/21/2019. Extensive medical records from [place] system review was performed by me.  Tumor size 9.3 cm, invading 99% of myometrium [3.55 out of 3.6 cm], positive right external iliac sentinel lymph nodes.  Negative washing.  No LVI, serosa is uninvolved. 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 pT1bpN30m  FIGO stage IIIC1 MSI intact HER 2 negative.   Patient was seen by Dr. BFransisca Connorson 07/06/2019 due to vaginal odor. Patient presents for establishing care and discussion for adjuvant chemotherapy radiation.  Per Dr. BBlake Divinenote, [facility].  Portex 3 regimen was recommended given subgroup analysis showing benefit in the serous histology versus clinical trial. HER2 was ordered and pending results.   Patient has a history of SVT.  10/14/2018 2D echo reviewed moderately reduced LV function with LVEF 35 to 40% with diffuse hypokinesis.  Patient had Lexiscan Myoview on 01/19/2019.  LVEF 49%.  SPECT images revealed a mild fixed inferior wall defect, scar versus artifact.  No evidence of ischemia.  She follows up with cardiology Sandra Fischer 04/26/2019 with a plan to stay on her current medication. 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 She is married and providing care for her mother.  # 10/20/2019 CT chest abdomen pelvis with contrast showed disease recurrence.  There are multiple newly enlarged retroperitoneal and right iliac lymph nodes the largest the left retroperitoneal node measuring 1.9 x 1.6 cm concerning for nodal metastasis disease.  Unchanged 4 mm groundglass pulmonary nodule of the left pulmonary apex.  This remains nonspecific. #Cardiomyopathy, LVEF35 to 40% Last seen by cardiology 04/26/2019.  Recommend patient to continue follow-up with cardiology.  # GERD  ER visit on 11/13/2019 due to chest pain which started after he ate at a restaurant and developed burning sensation in her chest.  No associated dyspnea. Patient had a CTA which showed no pulmonary embolism.  Patient symptoms got better after GI cocktail.  Was considered to be secondary to acid reflux.  Patient takes omeprazole 20 mg daily.   # S/p 6 cycles of chemotherapy with carboplatin and Taxol. Labs are reviewed and discussed with patient. CA125 18.9.  CT images were independantly reviewed by me and discussed with patient.  Multiple enlarged retroperitoneal and right iliac nodes have resolved completely.  I discussed with Sandra Fischer, according to data from GOG 258,  there were no differences in relapse-free survival fewer lower vaginal recurrences and fewer lower pelvic and para-aortic relapses with the addition of RT I will recommend patient to establish care with radiation oncology for discussion of benefit and risk.   INTERVAL HISTORY BNakiya Rallisis a 70y.o. female who has above history reviewed by me today presents for follow up visit for management of endometrial cancer. Problems and complaints are listed below: S/p 6 cycles of chemotherapy with carboplatin  and Taxol.   Postchemotherapy CT scan showed excellent treatment response. Patient reports feeling well.  She was accompanied by her husband. Patient has  received IV Venofer treatments since last visit She reports her energy level has significantly improved.    Denies hematochezia, hematuria, hematemesis, epistaxis, black tarry stool or easy bruising.    Review of Systems  Constitutional: Positive for fatigue. Negative for appetite change, chills and fever.  HENT:   Negative for hearing loss and voice change.   Eyes: Negative for eye problems.  Respiratory: Negative for chest tightness and cough.   Cardiovascular: Negative for chest pain.  Gastrointestinal: Negative for abdominal distention, abdominal pain and blood in stool.  Endocrine: Negative for hot flashes.  Genitourinary: Negative for difficulty urinating and frequency.   Musculoskeletal: Negative for arthralgias.  Skin: Negative for itching and rash.  Neurological: Positive for numbness. Negative for extremity weakness.  Hematological: Negative for adenopathy.  Psychiatric/Behavioral: Negative for confusion.    MEDICAL HISTORY:  Past Medical History:  Diagnosis Date  . Asthma   . Asthma   . Asthma exacerbation 08/22/2016  . Cancer ([place])   . Collagen vascular disease ([place])   . Diabetes mellitus without complication ([place])    History of Diabetes   . Environmental allergies   . Fibromyalgia   . High cholesterol   . Hypertension   . Iron deficiency anemia 07/03/2020  . RA (rheumatoid arthritis) ([place])   . Thyroid disease     SURGICAL HISTORY: Past Surgical History:  Procedure Laterality Date  . CATARACT EXTRACTION    . CHOLECYSTECTOMY    . fibroids removed    . THYROIDECTOMY, PARTIAL    . tummy tuck      SOCIAL HISTORY: Social History   Socioeconomic History  . Marital status: Married    Spouse name: Not on file  . Number of children: Not on file  . Years of education: Not on file  . Highest education level: Not on file  Occupational History  . Not on file  Tobacco Use  . Smoking status: Never Smoker  . Smokeless tobacco: Never Used  Vaping Use  .  Vaping Use: Never used  Substance and Sexual Activity  . Alcohol use: No  . Drug use: No  . Sexual activity: Yes    Birth control/protection: Post-menopausal  Other Topics Concern  . Not on file  Social History Narrative  . Not on file   Social Determinants of Health   Financial Resource Strain:   . Difficulty of Paying Living Expenses: Not on file  Food Insecurity:   . Worried About Charity fundraiser in the Last Year: Not on file  . Ran Out of Food in the Last Year: Not on file  Transportation Needs:   . Lack of Transportation (Medical): Not on file  . Lack of Transportation (Non-Medical): Not on file  Physical Activity:   . Days of Exercise per Week: Not on file  . Minutes of Exercise per Session: Not on file  Stress:   . Feeling of Stress : Not on file  Social Connections:   . Frequency of Communication with Friends and Family: Not on file  . Frequency of Social Gatherings with Friends and Family: Not on file  . Attends Religious Services: Not on file  . Active Member of Clubs or Organizations: Not on file  . Attends Archivist Meetings: Not on file  . Marital Status: Not on file  Intimate Partner Violence:   .  Fear of Current or Ex-Partner: Not on file  . Emotionally Abused: Not on file  . Physically Abused: Not on file  . Sexually Abused: Not on file    FAMILY HISTORY: Family History  Problem Relation Age of Onset  . Diabetes Mother   . Hypertension Mother   . Hyperlipidemia Mother   . Dementia Mother   . Breast cancer Neg Hx   . Ovarian cancer Neg Hx   . Colon cancer Neg Hx     ALLERGIES:  is allergic to abatacept, codeine, latex, shellfish allergy, and penicillins.  MEDICATIONS:  Current Outpatient Medications  Medication Sig Dispense Refill  . Acetaminophen (TYLENOL EXTRA STRENGTH PO) Take 650 mg by mouth. 2 tabs twice a day PRN    . albuterol (ACCUNEB) 1.25 MG/3ML nebulizer solution Take 1 ampule by nebulization every 6 (six) hours as  needed for wheezing.    Marland Kitchen albuterol (PROVENTIL HFA;VENTOLIN HFA) 108 (90 Base) MCG/ACT inhaler Inhale 2 puffs into the lungs every 6 (six) hours as needed for wheezing or shortness of breath. 1 Inhaler 0  . aspirin EC 81 MG tablet Take 81 mg by mouth daily.    Marland Kitchen azelastine (ASTELIN) 0.1 % nasal spray Place 2 sprays into both nostrils 2 (two) times daily. 30 mL 5  . budesonide-formoterol (SYMBICORT) 160-4.5 MCG/ACT inhaler Inhale 2 puffs into the lungs 2 (two) times daily.    . calcium carbonate (OS-CAL - DOSED IN MG OF ELEMENTAL CALCIUM) 1250 (500 Ca) MG tablet Take 1 tablet by mouth daily with breakfast.     . cetirizine (ZYRTEC) 10 MG tablet Take 10 mg by mouth daily.    . Cholecalciferol (D3 ADULT PO) Take 1 capsule by mouth daily.    Marland Kitchen desloratadine (CLARINEX) 5 MG tablet Take 5 mg by mouth daily.    Marland Kitchen docusate sodium (COLACE) 50 MG capsule Take 50 mg by mouth 2 (two) times daily.    . FEROSUL 325 (65 Fe) MG tablet TAKE ONE TABLET BY MOUTH TWICE DAILY WITH A MEAL 60 tablet 1  . fluticasone (FLONASE) 50 MCG/ACT nasal spray Place 1 spray into both nostrils daily.     Marland Kitchen gabapentin (NEURONTIN) 300 MG capsule Take 1 capsule (300 mg total) by mouth 3 (three) times daily. 90 capsule 1  . guaiFENesin-dextromethorphan (ROBITUSSIN DM) 100-10 MG/5ML syrup Take 5 mLs by mouth every 4 (four) hours as needed for cough. 118 mL 0  . ipratropium (ATROVENT) 0.02 % nebulizer solution Take 0.5 mg by nebulization every 4 (four) hours as needed.     . leflunomide (ARAVA) 20 MG tablet Take 20 mg by mouth daily.    Marland Kitchen levothyroxine (SYNTHROID, LEVOTHROID) 88 MCG tablet Take 88 mcg by mouth daily before breakfast.    . lidocaine-prilocaine (EMLA) cream Apply to affected area once 30 g 3  . lisinopril (PRINIVIL,ZESTRIL) 40 MG tablet Take 20 mg by mouth in the morning and at bedtime.     . metoprolol succinate (TOPROL-XL) 50 MG 24 hr tablet Take 50 mg by mouth daily. Take with or immediately following a meal.    .  montelukast (SINGULAIR) 10 MG tablet Take 10 mg by mouth at bedtime.    Marland Kitchen omeprazole (PRILOSEC) 10 MG capsule Take 20 mg by mouth daily.    . potassium chloride SA (KLOR-CON) 20 MEQ tablet Take 1 tablet (20 mEq total) by mouth daily. 5 tablet 0  . pravastatin (PRAVACHOL) 40 MG tablet Take 40 mg by mouth daily.    Marland Kitchen  Prenatal Vit-Fe Fumarate-FA (MULTIVITAMIN-PRENATAL) 27-0.8 MG TABS tablet Take 1 tablet by mouth daily.     . prochlorperazine (COMPAZINE) 10 MG tablet Take 1 tablet (10 mg total) by mouth every 6 (six) hours as needed (Nausea or vomiting). 30 tablet 1  . senna-docusate (SENOKOT-S) 8.6-50 MG tablet Take 1 tablet by mouth at bedtime as needed for mild constipation.     Marland Kitchen tiotropium (SPIRIVA) 18 MCG inhalation capsule Place 18 mcg into inhaler and inhale daily.    . traMADol (ULTRAM) 50 MG tablet Take 100 mg by mouth as needed (for rheumatoid arthritis).    . verapamil (CALAN) 120 MG tablet Take 240 mg by mouth 2 (two) times daily.    Marland Kitchen EPINEPHrine 0.3 mg/0.3 mL IJ SOAJ injection Use as directed for severe allergic reaction. (Patient not taking: Reported on 09/11/2020) 2 Device 1  . olopatadine (PATANOL) 0.1 % ophthalmic solution Place 1 drop into both eyes 2 (two) times daily. (Patient not taking: Reported on 09/11/2020) 5 mL 5   No current facility-administered medications for this visit.     PHYSICAL EXAMINATION: ECOG PERFORMANCE STATUS: 1 - Symptomatic but completely ambulatory  Physical Exam Constitutional:      General: She is not in acute distress. HENT:     Head: Normocephalic and atraumatic.  Eyes:     General: No scleral icterus. Cardiovascular:     Rate and Rhythm: Normal rate and regular rhythm.     Heart sounds: Normal heart sounds.  Pulmonary:     Effort: Pulmonary effort is normal. No respiratory distress.     Breath sounds: No wheezing.  Abdominal:     General: Bowel sounds are normal. There is no distension.     Palpations: Abdomen is soft.   Musculoskeletal:        General: No deformity. Normal range of motion.     Cervical back: Normal range of motion and neck supple.  Skin:    General: Skin is warm and dry.     Findings: No erythema or rash.  Neurological:     Mental Status: She is alert and oriented to person, place, and time. Mental status is at baseline.     Cranial Nerves: No cranial nerve deficit.     Coordination: Coordination normal.  Psychiatric:        Mood and Affect: Mood normal.     LABORATORY DATA:  I have reviewed the data as listed Lab Results  Component Value Date   WBC 7.3 09/10/2020   HGB 10.9 (L) 09/10/2020   HCT 34.9 (L) 09/10/2020   MCV 79.7 (L) 09/10/2020   PLT 256 09/10/2020   Recent Labs    03/14/20 1044 07/03/20 1254 09/10/20 1333  NA 142 139 136  K 4.0 3.1* 4.0  CL 108 104 102  CO2 '24 26 25  ' GLUCOSE 117* 170* 93  BUN '15 12 13  ' CREATININE 0.90 0.64 0.66  CALCIUM 9.5 8.6* 8.7*  GFRNONAA >60 >60 >60  GFRAA >60 >60 >60  PROT 7.9 8.3* 7.8  ALBUMIN 4.0 3.4* 3.5  AST '16 15 16  ' ALT '11 9 14  ' ALKPHOS 94 77 75  BILITOT 0.5 0.4 0.5   Iron/TIBC/Ferritin/ %Sat    Component Value Date/Time   IRON 44 09/10/2020 1333   TIBC 179 (L) 09/10/2020 1333   FERRITIN 884 (H) 09/10/2020 1333   IRONPCTSAT 25 09/10/2020 1333      RADIOGRAPHIC STUDIES: I have personally reviewed the radiological images as listed and agreed with the  findings in the report., No results found.   ASSESSMENT & PLAN:  1. Endometrial cancer ([place])   2. Iron deficiency anemia, unspecified iron deficiency anemia type   3. Port-A-Cath in place    #Recurrent endometrial cancer Status post 6 cycles of chemotherapy with carboplatin and Taxol. Post chemotherapy CT scan showed excellent response. enlarged retroperitoneal and right iliac nodes have resolved completely.   Labs are reviewed and discussed with patient. CEA stable. Continue monitor clinically.  Images will be ordered if clinically indicated. Continue  follow-up with gynecology oncology and radiation oncology   #Iron deficiency anemia, status post IV Venofer treatments. Patient has improved iron panel and hemoglobin. Hold additional IV Venofer treatments at this point Patient is waiting for colonoscopy examination. #Rheumatoid arthritis, follow-up rheumatologist.  She is going to start treatment. # Porta cath, recommend port flush every 8 weeks. She prefer to have port flushed at [place].   Return of visit: 3 months. Earlie Server, MD, PhD Hematology Oncology [facility] Pager- [phone] 09/11/2020

## 2020-09-12 ENCOUNTER — Telehealth: Payer: Self-pay | Admitting: *Deleted

## 2020-09-12 NOTE — Telephone Encounter (Signed)
Patient called stating that she saw her Ferritin results in MyChart and it is "really high and why was I not given medicine to make this come down?" She is requesting a call from Dr [name] to discuss this [phone]

## 2020-09-12 NOTE — Telephone Encounter (Signed)
Called and explained to her that her ferritin level is high, that's why she did not get IV venofer this week. Ferritin can be elevated due to her autoimmune condition and I do not recommend medication to bring the ferritin down. She appreciates explanation.

## 2020-09-13 ENCOUNTER — Telehealth: Payer: Self-pay | Admitting: Obstetrics and Gynecology

## 2020-09-13 NOTE — Telephone Encounter (Signed)
Writer received message that patient wanted to change to see Dr. Fransisca Connors and that appt date would need to change. Writer phoned patient and moved appt date to 10-03-20, when Dr. Fransisca Connors would be here.

## 2020-09-19 ENCOUNTER — Ambulatory Visit: Payer: Medicare Other

## 2020-10-03 ENCOUNTER — Other Ambulatory Visit: Payer: Self-pay

## 2020-10-03 ENCOUNTER — Encounter: Payer: Self-pay | Admitting: Obstetrics and Gynecology

## 2020-10-03 ENCOUNTER — Inpatient Hospital Stay: Payer: Medicare Other | Attending: Obstetrics and Gynecology | Admitting: Obstetrics and Gynecology

## 2020-10-03 ENCOUNTER — Inpatient Hospital Stay: Payer: Medicare Other

## 2020-10-03 ENCOUNTER — Ambulatory Visit: Payer: Medicare Other

## 2020-10-03 VITALS — BP 149/71 | HR 58 | Resp 20 | Wt 171.5 lb

## 2020-10-03 DIAGNOSIS — I429 Cardiomyopathy, unspecified: Secondary | ICD-10-CM | POA: Insufficient documentation

## 2020-10-03 DIAGNOSIS — Z7952 Long term (current) use of systemic steroids: Secondary | ICD-10-CM | POA: Insufficient documentation

## 2020-10-03 DIAGNOSIS — I10 Essential (primary) hypertension: Secondary | ICD-10-CM | POA: Insufficient documentation

## 2020-10-03 DIAGNOSIS — M069 Rheumatoid arthritis, unspecified: Secondary | ICD-10-CM | POA: Diagnosis not present

## 2020-10-03 DIAGNOSIS — Z90710 Acquired absence of both cervix and uterus: Secondary | ICD-10-CM | POA: Diagnosis not present

## 2020-10-03 DIAGNOSIS — Z9221 Personal history of antineoplastic chemotherapy: Secondary | ICD-10-CM | POA: Insufficient documentation

## 2020-10-03 DIAGNOSIS — C541 Malignant neoplasm of endometrium: Secondary | ICD-10-CM | POA: Diagnosis present

## 2020-10-03 DIAGNOSIS — Z7982 Long term (current) use of aspirin: Secondary | ICD-10-CM | POA: Insufficient documentation

## 2020-10-03 DIAGNOSIS — M797 Fibromyalgia: Secondary | ICD-10-CM | POA: Insufficient documentation

## 2020-10-03 DIAGNOSIS — T451X5S Adverse effect of antineoplastic and immunosuppressive drugs, sequela: Secondary | ICD-10-CM | POA: Insufficient documentation

## 2020-10-03 DIAGNOSIS — Z95828 Presence of other vascular implants and grafts: Secondary | ICD-10-CM

## 2020-10-03 DIAGNOSIS — K219 Gastro-esophageal reflux disease without esophagitis: Secondary | ICD-10-CM | POA: Diagnosis not present

## 2020-10-03 DIAGNOSIS — I471 Supraventricular tachycardia: Secondary | ICD-10-CM | POA: Diagnosis not present

## 2020-10-03 DIAGNOSIS — Z79899 Other long term (current) drug therapy: Secondary | ICD-10-CM | POA: Insufficient documentation

## 2020-10-03 DIAGNOSIS — R911 Solitary pulmonary nodule: Secondary | ICD-10-CM | POA: Diagnosis not present

## 2020-10-03 DIAGNOSIS — E039 Hypothyroidism, unspecified: Secondary | ICD-10-CM | POA: Diagnosis not present

## 2020-10-03 DIAGNOSIS — Z90722 Acquired absence of ovaries, bilateral: Secondary | ICD-10-CM | POA: Insufficient documentation

## 2020-10-03 DIAGNOSIS — G620 Drug-induced polyneuropathy: Secondary | ICD-10-CM | POA: Diagnosis not present

## 2020-10-03 DIAGNOSIS — E119 Type 2 diabetes mellitus without complications: Secondary | ICD-10-CM | POA: Diagnosis not present

## 2020-10-03 MED ORDER — HEPARIN SOD (PORK) LOCK FLUSH 100 UNIT/ML IV SOLN
INTRAVENOUS | Status: AC
  Filled 2020-10-03: qty 5

## 2020-10-03 MED ORDER — SODIUM CHLORIDE 0.9% FLUSH
10.0000 mL | INTRAVENOUS | Status: DC | PRN
  Administered 2020-10-03: 10 mL via INTRAVENOUS
  Filled 2020-10-03: qty 10

## 2020-10-03 MED ORDER — HEPARIN SOD (PORK) LOCK FLUSH 100 UNIT/ML IV SOLN
500.0000 [IU] | Freq: Once | INTRAVENOUS | Status: AC
  Administered 2020-10-03: 500 [IU] via INTRAVENOUS
  Filled 2020-10-03: qty 5

## 2020-10-03 NOTE — Progress Notes (Signed)
Gynecologic Oncology Interval Visit   Referring Provider: Dr. Amalia Hailey  Chief Complaint: Stage IIIC-1 serous endometrial cancer  Subjective:  Sandra Fischer is a 69 y.o. G2P0 female (s/p laparotomy myomectomy for leiomyoma), initially seen in consultation from Dr. 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   Post chemotherapy CT showed excellent response. Previously enlarged retroperitoneal and right iliac nodes have resolved. Adjuvant radiation was not recommended. Today, CA 125 stable and low. No major complaints today.   CA 125 09/10/20 11.2 07/03/20  13.6  Gynecologic Oncology History:  She initially presented to Dr. Amalia Hailey as [phone] female with complaint of postmenopausal bleeding, intermittent, heavy at times.    Transabdominal ultrasound on 05/05/2019 demonstrated endometrium measuring 78m. Uterus anteverted measuring 11.3 x 6.6 x 7.9 cm, heterogeous echo texture w/o evidence of focal masses. Within uterus multiple suspected fibroids measuring 7.2 x 4.8 x 6.6 cm and 2.6 x 1.7 x 3.1 cm. Ovaries not visualized. No adnexal masses. No free fluid in cul de sac.   Endometrial biopsy was performed on 05/20/2019 and demonstrated high-grade mixed endometrioid, predominantly, and serous carcinoma.  She complains of persistent bleeding and fatigue which she attributes to the bleeding. She presents today for management. She has significant medical issues including rheumatoid arthritis requiring chronic steroids (5-10 mg daily) for a long time (she does not know how long) and leflunomide (ARAVA). She also has undergone myomectomy for leiomyoma and abdominoplasty.  She also has a history of cardiac disease. She Cardioversion for SVT on 10/17/2018. Her cardiologist Dr. [name] [facility] and shortness of breath, noted to be in SVT, converted  to sinus rhythm with adenosine,in the setting of colitis, anemia with hemoglobin 8.5, and untreated hypothyroidism with TSH 25, which has since returned to normal range.2D echocardiogram revealed moderately reduced LV function with LVEF 35 to 40% with diffuse hypokinesis. The patient underwent Lexiscan Myoview on 01/19/2019, which revealed revealed LVEF 49%, a mild fixed inferior wall defect, scar versus artifact, with no evidence of ischemia. She was last seen on 04/26/2019 by Cardiology ([name] with a plan to stay on her current medications and she was counseled about low sodium diet, DASH, and continuing hyperlipidemia medications.   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 She is married and provides care for her mother.   06/14/2019 CT C/A/P IMPRESSION: 1. Bulky fibroid uterus. There is no direct noncontrast CT evidence of endometrial malignancy. No evidence of lymphadenopathy or metastatic disease in the chest, abdomen, or pelvis. Please note that noncontrast CT is limited for the evaluation of solid organ metastases. 2. Stable 4 mm ground-glass pulmonary nodule of the left pulmonary apex (series 3, image 24). Although nonspecific, this is an unlikely isolated manifestation of metastatic disease. Attention on follow-up. 3. Chronic, incidental, and postoperative findings as detailed above.  She underwent TLH_BSO with [name] 06/21/2019.   Tumor size 9.3 cm, invading 99% of myometrium (3.55 of 3.6 cm), positive right external iliac sentinel lymph node. Negative washings. MSS/MMR - Intact/normal. HER2 - negative at 28%/1+.   Case was discussed at [facility]. Possible PORTEC3 regimen given subgroup analysis showing benefit in serous history vs clinical trial was discussed.   Based on pathology, adjuvant chemotherapy & radiation was recommended. She saw Dr. YTasia Catchingson 07/14/2019 and lengthy discussion regarding rationale of adjuvant treatment. She declined adjuvant  treatment.  10/20/2019- CT Chest Abdomen Pelvis W Contrast multiple newly enlarged retroperitoneal and right iliac lymph nodes, the largest left retroperitoneal node measuring 1.9 x 1.6 cm (series 2, image 71), concerning for nodal metastatic disease. 3. Unchanged 4 mm ground-glass pulmonary nodule of the left pulmonary apex (series 3, image 24). This remains nonspecific although is again an unlikely manifestation of pulmonary metastatic disease.  Findings consistent with recurrent disease with adenopathy.   11/07/2019-02/20/20 She agreed to treatment and received 6 cycles of carbo-taxol chemotherapy  (dose reduced d/t neuropathy).   03/12/2020 CT Abdomen/pelvis The multiple enlarged retroperitoneal and right iliac lymph nodes identified as new on the previous exam from 10/20/2019 have resolved completely in the interval. 2. Tiny ground-glass pulmonary nodule medial left lung apex is stable in the interval. Likely benign, continued attention on follow-up recommended.  Data from GOG 258 was discussed including no differences in relapse free survival, fewer lower vaginal recurrences and fewer lower pelvic and para-aortic relapsed with the addition of RT. She was seen by Dr. [name] who did not recommend WPRT.   Problem List: Patient Active Problem List   Diagnosis Date Noted  . Diabetic neuropathy ([place]) 08/02/2020  . Iron deficiency anemia 07/03/2020  . Vaginal discharge 05/30/2020  . Encounter for antineoplastic chemotherapy 01/30/2020  . Neuropathy due to chemotherapeutic drug ([place]) 01/30/2020  . Anemia 01/30/2020  . Gastroesophageal reflux disease 12/08/2019  . Microcytic anemia 11/07/2019  . Port-A-Cath in place 11/04/2019  . Rheumatoid arthritis ([place]) 10/29/2019  . Goals of care, counseling/discussion 07/20/2019  . Endometrial cancer ([place]) 07/15/2019  . Acute blood loss anemia 06/22/2019  . Hypothyroidism, adult 06/15/2019  . Myalgia and myositis 06/15/2019  . Fibromyalgia   .  Cardiomyopathy ([place]) 12/06/2018  . Atypical chest pain 12/06/2018  . Perennial and seasonal allergic rhinitis 02/09/2018  . Moderate persistent asthma 02/09/2018  . Allergic conjunctivitis 02/09/2018  . History of food allergy 02/09/2018  . Allergic reaction 02/09/2018  . RA (rheumatoid arthritis) ([place]) 08/22/2016  . HTN (hypertension) 08/22/2016    Past Medical History: Past Medical History:  Diagnosis Date  . Asthma   . Asthma   . Asthma exacerbation 08/22/2016  . Cancer ([place])   . Collagen vascular disease ([place])   . Diabetes mellitus without complication ([place])    History of Diabetes   . Environmental allergies   . Fibromyalgia   . High cholesterol   . Hypertension   . Iron deficiency anemia 07/03/2020  . RA (rheumatoid arthritis) ([place])   . Thyroid disease     Past Surgical History: Past Surgical History:  Procedure Laterality Date  . CATARACT EXTRACTION    . CHOLECYSTECTOMY    . fibroids removed    . THYROIDECTOMY, PARTIAL    . tummy tuck      Past Gynecologic History:  As per HPI  OB History:  OB History  Gravida Para Term Preterm AB Living  2       2    SAB TAB Ectopic Multiple Live Births  2            # Outcome Date GA Lbr Len/2nd Weight Sex Delivery Anes PTL Lv  2 SAB           1 SAB             Family History: Family History  Problem Relation Age of Onset  . Diabetes Mother   . Hypertension Mother   . Hyperlipidemia Mother   . Dementia Mother   . Breast  cancer Neg Hx   . Ovarian cancer Neg Hx   . Colon cancer Neg Hx     Social History: Social History   Socioeconomic History  . Marital status: Married    Spouse name: Not on file  . Number of children: Not on file  . Years of education: Not on file  . Highest education level: Not on file  Occupational History  . Not on file  Tobacco Use  . Smoking status: Never Smoker  . Smokeless tobacco: Never Used  Vaping Use  . Vaping Use: Never used  Substance and Sexual Activity  . Alcohol  use: No  . Drug use: No  . Sexual activity: Yes    Birth control/protection: Post-menopausal  Other Topics Concern  . Not on file  Social History Narrative  . Not on file   Social Determinants of Health   Financial Resource Strain:   . Difficulty of Paying Living Expenses: Not on file  Food Insecurity:   . Worried About Charity fundraiser in the Last Year: Not on file  . Ran Out of Food in the Last Year: Not on file  Transportation Needs:   . Lack of Transportation (Medical): Not on file  . Lack of Transportation (Non-Medical): Not on file  Physical Activity:   . Days of Exercise per Week: Not on file  . Minutes of Exercise per Session: Not on file  Stress:   . Feeling of Stress : Not on file  Social Connections:   . Frequency of Communication with Friends and Family: Not on file  . Frequency of Social Gatherings with Friends and Family: Not on file  . Attends Religious Services: Not on file  . Active Member of Clubs or Organizations: Not on file  . Attends Archivist Meetings: Not on file  . Marital Status: Not on file  Intimate Partner Violence:   . Fear of Current or Ex-Partner: Not on file  . Emotionally Abused: Not on file  . Physically Abused: Not on file  . Sexually Abused: Not on file    Allergies: Allergies  Allergen Reactions  . Abatacept Hives  . Codeine Hives, Nausea And Vomiting and Nausea Only  . Latex Itching and Hives  . Shellfish Allergy Swelling  . Penicillins Nausea And Vomiting    Current Medications: Current Outpatient Medications  Medication Sig Dispense Refill  . Acetaminophen (TYLENOL EXTRA STRENGTH PO) Take 650 mg by mouth. 2 tabs twice a day PRN    . albuterol (ACCUNEB) 1.25 MG/3ML nebulizer solution Take 1 ampule by nebulization every 6 (six) hours as needed for wheezing.    Marland Kitchen albuterol (PROVENTIL HFA;VENTOLIN HFA) 108 (90 Base) MCG/ACT inhaler Inhale 2 puffs into the lungs every 6 (six) hours as needed for wheezing or  shortness of breath. 1 Inhaler 0  . aspirin EC 81 MG tablet Take 81 mg by mouth daily.    Marland Kitchen azelastine (ASTELIN) 0.1 % nasal spray Place 2 sprays into both nostrils 2 (two) times daily. 30 mL 5  . budesonide-formoterol (SYMBICORT) 160-4.5 MCG/ACT inhaler Inhale 2 puffs into the lungs 2 (two) times daily.    . calcium carbonate (OS-CAL - DOSED IN MG OF ELEMENTAL CALCIUM) 1250 (500 Ca) MG tablet Take 1 tablet by mouth daily with breakfast.     . cetirizine (ZYRTEC) 10 MG tablet Take 10 mg by mouth daily.    . Cholecalciferol (D3 ADULT PO) Take 1 capsule by mouth daily.    Marland Kitchen desloratadine (CLARINEX)  5 MG tablet Take 5 mg by mouth daily.    Marland Kitchen docusate sodium (COLACE) 50 MG capsule Take 50 mg by mouth 2 (two) times daily.    . FEROSUL 325 (65 Fe) MG tablet TAKE ONE TABLET BY MOUTH TWICE DAILY WITH A MEAL 60 tablet 1  . fluticasone (FLONASE) 50 MCG/ACT nasal spray Place 1 spray into both nostrils daily.     Marland Kitchen gabapentin (NEURONTIN) 300 MG capsule Take 1 capsule (300 mg total) by mouth 3 (three) times daily. 90 capsule 1  . guaiFENesin-dextromethorphan (ROBITUSSIN DM) 100-10 MG/5ML syrup Take 5 mLs by mouth every 4 (four) hours as needed for cough. 118 mL 0  . ipratropium (ATROVENT) 0.02 % nebulizer solution Take 0.5 mg by nebulization every 4 (four) hours as needed.     . leflunomide (ARAVA) 20 MG tablet Take 20 mg by mouth daily.    Marland Kitchen levothyroxine (SYNTHROID, LEVOTHROID) 88 MCG tablet Take 88 mcg by mouth daily before breakfast.    . lidocaine-prilocaine (EMLA) cream Apply to affected area once 30 g 3  . lisinopril (PRINIVIL,ZESTRIL) 40 MG tablet Take 20 mg by mouth in the morning and at bedtime.     . metoprolol succinate (TOPROL-XL) 50 MG 24 hr tablet Take 50 mg by mouth daily. Take with or immediately following a meal.    . montelukast (SINGULAIR) 10 MG tablet Take 10 mg by mouth at bedtime.    Marland Kitchen omeprazole (PRILOSEC) 10 MG capsule Take 20 mg by mouth daily.    . potassium chloride SA (KLOR-CON)  20 MEQ tablet Take 1 tablet (20 mEq total) by mouth daily. 5 tablet 0  . pravastatin (PRAVACHOL) 40 MG tablet Take 40 mg by mouth daily.    . Prenatal Vit-Fe Fumarate-FA (MULTIVITAMIN-PRENATAL) 27-0.8 MG TABS tablet Take 1 tablet by mouth daily.     . prochlorperazine (COMPAZINE) 10 MG tablet Take 1 tablet (10 mg total) by mouth every 6 (six) hours as needed (Nausea or vomiting). 30 tablet 1  . senna-docusate (SENOKOT-S) 8.6-50 MG tablet Take 1 tablet by mouth at bedtime as needed for mild constipation.     Marland Kitchen tiotropium (SPIRIVA) 18 MCG inhalation capsule Place 18 mcg into inhaler and inhale daily.    . traMADol (ULTRAM) 50 MG tablet Take 100 mg by mouth as needed (for rheumatoid arthritis).    . verapamil (CALAN) 120 MG tablet Take 240 mg by mouth 2 (two) times daily.    Marland Kitchen EPINEPHrine 0.3 mg/0.3 mL IJ SOAJ injection Use as directed for severe allergic reaction. (Patient not taking: Reported on 09/11/2020) 2 Device 1  . olopatadine (PATANOL) 0.1 % ophthalmic solution Place 1 drop into both eyes 2 (two) times daily. (Patient not taking: Reported on 09/11/2020) 5 mL 5   No current facility-administered medications for this visit.   Review of Systems General:  Fatigue, weakness Skin: no complaints Eyes: no complaints HEENT: no complaints Breasts: no complaints Pulmonary: no complaints Cardiac: no complaints Gastrointestinal: no complaints Genitourinary/Sexual: no complaints Ob/Gyn: no complaints Musculoskeletal: no complaints Hematology: no complaints Neurologic/Psych: no complaints   Objective:  Physical Examination:  There were no vitals taken for this visit.    ECOG Performance Status: 1 - Symptomatic but completely ambulatory   GENERAL: Patient is a well appearing female in no acute distress HEENT:  Sclera clear. Anicteric NODES:  Negative axillary, supraclavicular, inguinal lymph node survery LUNGS:  Clear to auscultation bilaterally.   HEART:  Regular rate and rhythm.   ABDOMEN:  Soft, nontender.  No hernias, incisions well healed. No masses or ascites EXTREMITIES:  No peripheral edema. Atraumatic. No cyanosis SKIN:  Clear with no obvious rashes or skin changes.  NEURO:  Nonfocal. Well oriented.  Appropriate affect.  Pelvic: exam chaperoned by CMA EGBUS: no lesions Cervix: surgically absent Vagina: positive for yellowish small amount of discharge; no lesions, or bleeding Uterus: surgically absent BME: no palpable masses Rectovaginal: confirmatory  Labs: No labs on site today  Radiologic Imaging:  No imaging on site today     Assessment:  Sandra Fischer is a 69 y.o. 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  Tumor size 9.3 cm, invading 99% of myometrium (3.55 of 3.6cm), positive right external iliac sentinel lymph nodes. Negative washings. Recurrent disease 10/20/2019 with adenopathy up to 1.9 x 1.6 cm with CR to chemotherapy with paclitaxel/carboplatin completed 01/2020. Normal exam today.  Pulmonary nodule, uncertain etiology.  Grade 2 peripheral neuropathy.    Medical co-morbidities complicating care: She has significant medical issues including rheumatoid arthritis requiring chronic steroids (5-10 mg daily) for a long time (she does not know how long) and leflunomide (ARAVA); HTN; Dilated cardiomyopathy; hyperlipidemia; multiple intra-abdominal surgeries (myomectomy for leiomyoma, cholecystectomy, and abdominoplasty) ; obesity There is no height or weight on file to calculate BMI.  Plan:   Problem List Items Addressed This Visit      Genitourinary   Endometrial cancer ([place]) - Primary      Follow up with Dr. [name] with imaging for pulmonary nodule and assess for disease. Plan to follow up with Gyn Oncology in 6 months.   I have recommended continued close follow up with exams, including  pelvic exams every 3-6 months for 2-3 years, then every 6-12 months for 3-5 years and then annually thereafter.  Imaging assessment given recurrent disease is reasonable.   Beckey Rutter, DNP, AGNP-C [place] [facility] [phone] (clinic)  I personally interviewed and examined the patient. Agreed with the above/below plan of care. I have directly contributed to assessment and plan of care of this patient and educated and discussed with patient and family.  Mellody Drown, MD

## 2020-10-18 ENCOUNTER — Ambulatory Visit (INDEPENDENT_AMBULATORY_CARE_PROVIDER_SITE_OTHER): Payer: Medicare Other | Admitting: Podiatry

## 2020-10-18 ENCOUNTER — Ambulatory Visit (INDEPENDENT_AMBULATORY_CARE_PROVIDER_SITE_OTHER): Payer: Medicare Other | Admitting: Obstetrics and Gynecology

## 2020-10-18 ENCOUNTER — Encounter: Payer: Self-pay | Admitting: Podiatry

## 2020-10-18 ENCOUNTER — Encounter: Payer: Self-pay | Admitting: Obstetrics and Gynecology

## 2020-10-18 ENCOUNTER — Other Ambulatory Visit: Payer: Self-pay

## 2020-10-18 VITALS — BP 139/86 | HR 76 | Ht 62.0 in | Wt 183.7 lb

## 2020-10-18 DIAGNOSIS — C541 Malignant neoplasm of endometrium: Secondary | ICD-10-CM | POA: Diagnosis not present

## 2020-10-18 DIAGNOSIS — T451X5A Adverse effect of antineoplastic and immunosuppressive drugs, initial encounter: Secondary | ICD-10-CM | POA: Diagnosis not present

## 2020-10-18 DIAGNOSIS — G620 Drug-induced polyneuropathy: Secondary | ICD-10-CM | POA: Diagnosis not present

## 2020-10-18 DIAGNOSIS — B351 Tinea unguium: Secondary | ICD-10-CM | POA: Diagnosis not present

## 2020-10-18 DIAGNOSIS — L83 Acanthosis nigricans: Secondary | ICD-10-CM

## 2020-10-18 DIAGNOSIS — M79675 Pain in left toe(s): Secondary | ICD-10-CM | POA: Diagnosis not present

## 2020-10-18 DIAGNOSIS — M79674 Pain in right toe(s): Secondary | ICD-10-CM

## 2020-10-18 NOTE — Progress Notes (Signed)
HPI:      [name] is a 69 y.o. G2P0020 who LMP was No LMP recorded. Patient is postmenopausal.  Subjective:   She presents today because of a concern that she has a very mole that has seemed to change in size over the last several months. Patient has a history of endometrial cancer and positive lymph nodes.  She was treated with both radiation and chemotherapy.  As of her last visit the significant lymph nodes have decreased in size and were no longer considered a factor of her cancer.  The patient is certain that she is now " cancer free". She reports that the area in question does not itch or burn.  She reports no bleeding from the area.    Hx: The following portions of the patient's history were reviewed and updated as appropriate:             She  has a past medical history of Asthma, Asthma, Asthma exacerbation (08/22/2016), Cancer ([place]), Collagen vascular disease ([place]), Diabetes mellitus without complication ([place]), Environmental allergies, Fibromyalgia, High cholesterol, Hypertension, Iron deficiency anemia (07/03/2020), RA (rheumatoid arthritis) ([place]), and Thyroid disease. She does not have any pertinent problems on file. She  has a past surgical history that includes Cholecystectomy; Cataract extraction; Thyroidectomy, partial; fibroids removed; and tummy tuck. Her family history includes Dementia in her mother; Diabetes in her mother; Hyperlipidemia in her mother; Hypertension in her mother. She  reports that she has never smoked. She has never used smokeless tobacco. She reports that she does not drink alcohol and does not use drugs. She has a current medication list which includes the following prescription(s): acetaminophen, albuterol, albuterol, aspirin ec, azelastine, budesonide-formoterol, calcium carbonate, cetirizine, cholecalciferol, desloratadine, docusate sodium, epinephrine, ferosul, fluticasone, gabapentin, guaifenesin-dextromethorphan, ipratropium, leflunomide,  levothyroxine, lidocaine-prilocaine, lisinopril, metoprolol succinate, montelukast, olopatadine, omeprazole, potassium chloride sa, pravastatin, multivitamin-prenatal, prochlorperazine, senna-docusate, tiotropium, tramadol, and verapamil. She is allergic to abatacept, codeine, latex, shellfish allergy, and penicillins.       Review of Systems:  Review of Systems  Constitutional: Denied constitutional symptoms, night sweats, recent illness, fatigue, fever, insomnia and weight loss.  Eyes: Denied eye symptoms, eye pain, photophobia, vision change and visual disturbance.  Ears/Nose/Throat/Neck: Denied ear, nose, throat or neck symptoms, hearing loss, nasal discharge, sinus congestion and sore throat.  Cardiovascular: Denied cardiovascular symptoms, arrhythmia, chest pain/pressure, edema, exercise intolerance, orthopnea and palpitations.  Respiratory: Denied pulmonary symptoms, asthma, pleuritic pain, productive sputum, cough, dyspnea and wheezing.  Gastrointestinal: Denied, gastro-esophageal reflux, melena, nausea and vomiting.  Genitourinary: Denied genitourinary symptoms including symptomatic vaginal discharge, pelvic relaxation issues, and urinary complaints.  Musculoskeletal: Denied musculoskeletal symptoms, stiffness, swelling, muscle weakness and myalgia.  Dermatologic: Denied dermatology symptoms, rash and scar.  Neurologic: Denied neurology symptoms, dizziness, headache, neck pain and syncope.  Psychiatric: Denied psychiatric symptoms, anxiety and depression.  Endocrine: Denied endocrine symptoms including hot flashes and night sweats.   Meds:   Current Outpatient Medications on File Prior to Visit  Medication Sig Dispense Refill  . Acetaminophen (TYLENOL EXTRA STRENGTH PO) Take 650 mg by mouth. 2 tabs twice a day PRN    . albuterol (ACCUNEB) 1.25 MG/3ML nebulizer solution Take 1 ampule by nebulization every 6 (six) hours as needed for wheezing.    Marland Kitchen albuterol (PROVENTIL HFA;VENTOLIN  HFA) 108 (90 Base) MCG/ACT inhaler Inhale 2 puffs into the lungs every 6 (six) hours as needed for wheezing or shortness of breath. 1 Inhaler 0  . aspirin EC 81 MG tablet Take 81 mg  by mouth daily.    Marland Kitchen azelastine (ASTELIN) 0.1 % nasal spray Place 2 sprays into both nostrils 2 (two) times daily. 30 mL 5  . budesonide-formoterol (SYMBICORT) 160-4.5 MCG/ACT inhaler Inhale 2 puffs into the lungs 2 (two) times daily.    . calcium carbonate (OS-CAL - DOSED IN MG OF ELEMENTAL CALCIUM) 1250 (500 Ca) MG tablet Take 1 tablet by mouth daily with breakfast.     . cetirizine (ZYRTEC) 10 MG tablet Take 10 mg by mouth daily.    . Cholecalciferol (D3 ADULT PO) Take 1 capsule by mouth daily.    Marland Kitchen desloratadine (CLARINEX) 5 MG tablet Take 5 mg by mouth daily.    Marland Kitchen docusate sodium (COLACE) 50 MG capsule Take 50 mg by mouth 2 (two) times daily.    Marland Kitchen EPINEPHrine 0.3 mg/0.3 mL IJ SOAJ injection Use as directed for severe allergic reaction. 2 Device 1  . FEROSUL 325 (65 Fe) MG tablet TAKE ONE TABLET BY MOUTH TWICE DAILY WITH A MEAL 60 tablet 1  . fluticasone (FLONASE) 50 MCG/ACT nasal spray Place 1 spray into both nostrils daily.     Marland Kitchen gabapentin (NEURONTIN) 300 MG capsule Take 1 capsule (300 mg total) by mouth 3 (three) times daily. 90 capsule 1  . guaiFENesin-dextromethorphan (ROBITUSSIN DM) 100-10 MG/5ML syrup Take 5 mLs by mouth every 4 (four) hours as needed for cough. 118 mL 0  . ipratropium (ATROVENT) 0.02 % nebulizer solution Take 0.5 mg by nebulization every 4 (four) hours as needed.     . leflunomide (ARAVA) 20 MG tablet Take 20 mg by mouth daily.    Marland Kitchen levothyroxine (SYNTHROID, LEVOTHROID) 88 MCG tablet Take 88 mcg by mouth daily before breakfast.    . lidocaine-prilocaine (EMLA) cream Apply to affected area once 30 g 3  . lisinopril (PRINIVIL,ZESTRIL) 40 MG tablet Take 20 mg by mouth in the morning and at bedtime.     . metoprolol succinate (TOPROL-XL) 50 MG 24 hr tablet Take 50 mg by mouth daily. Take with  or immediately following a meal.    . montelukast (SINGULAIR) 10 MG tablet Take 10 mg by mouth at bedtime.    Marland Kitchen olopatadine (PATANOL) 0.1 % ophthalmic solution Place 1 drop into both eyes 2 (two) times daily. 5 mL 5  . omeprazole (PRILOSEC) 10 MG capsule Take 20 mg by mouth daily.    . potassium chloride SA (KLOR-CON) 20 MEQ tablet Take 1 tablet (20 mEq total) by mouth daily. 5 tablet 0  . pravastatin (PRAVACHOL) 40 MG tablet Take 40 mg by mouth daily.    . Prenatal Vit-Fe Fumarate-FA (MULTIVITAMIN-PRENATAL) 27-0.8 MG TABS tablet Take 1 tablet by mouth daily.     . prochlorperazine (COMPAZINE) 10 MG tablet Take 1 tablet (10 mg total) by mouth every 6 (six) hours as needed (Nausea or vomiting). 30 tablet 1  . senna-docusate (SENOKOT-S) 8.6-50 MG tablet Take 1 tablet by mouth at bedtime as needed for mild constipation.     Marland Kitchen tiotropium (SPIRIVA) 18 MCG inhalation capsule Place 18 mcg into inhaler and inhale daily.    . traMADol (ULTRAM) 50 MG tablet Take 100 mg by mouth as needed (for rheumatoid arthritis).    . verapamil (CALAN) 120 MG tablet Take 240 mg by mouth 2 (two) times daily.     No current facility-administered medications on file prior to visit.          Objective:     Vitals:   10/18/20 0910  BP: 139/86  Pulse: 76   Filed Weights   10/18/20 0910  Weight: 183 lb 11.2 oz (83.3 kg)              Examination of the right mons area reveals a 1 cm diameter hyperpigmented raised lesion.  Assessment:    G2P0020 Patient Active Problem List   Diagnosis Date Noted  . Diabetic neuropathy ([place]) 08/02/2020  . Iron deficiency anemia 07/03/2020  . Vaginal discharge 05/30/2020  . Encounter for antineoplastic chemotherapy 01/30/2020  . 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 Anemia 01/30/2020  . Gastroesophageal reflux disease 12/08/2019  . Microcytic anemia 11/07/2019  . Port-A-Cath in place 11/04/2019  . Rheumatoid arthritis ([place]) 10/29/2019  . Goals of  care, counseling/discussion 07/20/2019  . Endometrial cancer ([place]) 07/15/2019  . Acute blood loss anemia 06/22/2019  . Hypothyroidism, adult 06/15/2019  . Myalgia and myositis 06/15/2019  . Fibromyalgia   . Cardiomyopathy ([place]) 12/06/2018  . Atypical chest pain 12/06/2018  . Perennial and seasonal allergic rhinitis 02/09/2018  . Moderate persistent asthma 02/09/2018  . Allergic conjunctivitis 02/09/2018  . History of food allergy 02/09/2018  . Allergic reaction 02/09/2018  . RA (rheumatoid arthritis) ([place]) 08/22/2016  . HTN (hypertension) 08/22/2016     1. Acanthosis   2. Endometrial cancer determined by uterine biopsy ([place])     I believe that this is a hyperpigmented acanthotic lesion.  Does not appear as a melanoma.   Plan:            1.  I have reassured her regarding this.  I have informed her that if it continues to increase in size begins bleeding or she has other symptoms that we should consider a biopsy.  Orders No orders of the defined types were placed in this encounter.   No orders of the defined types were placed in this encounter.     F/U  Return for Pt to contact us if symptoms worsen. I spent 13 minutes involved in the care of this patient preparing to see the patient by obtaining and reviewing her medical history (including labs, imaging tests and prior procedures), documenting clinical information in the electronic health record (EHR), counseling and coordinating care plans, writing and sending prescriptions, ordering tests or procedures and directly communicating with the patient by discussing pertinent items from her history and physical exam as well as detailing my assessment and plan as noted above so that she has an informed understanding.  All of her questions were answered.  Finis Bud, M.D. 10/18/2020 9:46 AM

## 2020-10-18 NOTE — Progress Notes (Signed)
This patient returns to my office for at risk foot care.  This patient requires this care by a professional since this patient will be at risk due to having diet controlled diabetes.   This patient is unable to cut nails herself since the patient cannot reach her nails.These nails are painful walking and wearing shoes.  This patient presents for at risk foot care today.  General Appearance  Alert, conversant and in no acute stress.  Vascular  Dorsalis pedis and posterior tibial  pulses are palpable  bilaterally.  Capillary return is within normal limits  bilaterally. Temperature is within normal limits  bilaterally.  Neurologic  Senn-Weinstein monofilament wire test diminished   bilaterally. Muscle power within normal limits bilaterally.  Nails Thick disfigured discolored nails with subungual debris  from hallux to fifth toes bilaterally. No evidence of bacterial infection or drainage bilaterally.  Orthopedic  No limitations of motion  feet .  No crepitus or effusions noted.  No bony pathology or digital deformities noted.  HAV  B/L.  Skin  normotropic skin with no porokeratosis noted bilaterally.  No signs of infections or ulcers noted.     Onychomycosis  Pain in right toes  Pain in left toes  Diabetes    Consent was obtained for treatment procedures.   Mechanical debridement of nails 1-5  bilaterally performed with a nail nipper.  Filed with dremel without incident.     Return office visit   3 months                   Told patient to return for periodic foot care and evaluation due to potential at risk complications.   Gardiner Barefoot DPM

## 2020-11-05 ENCOUNTER — Ambulatory Visit: Payer: Medicare Other | Admitting: Podiatry

## 2020-11-21 ENCOUNTER — Telehealth: Payer: Self-pay | Admitting: *Deleted

## 2020-11-21 ENCOUNTER — Other Ambulatory Visit: Payer: Self-pay | Admitting: *Deleted

## 2020-11-21 NOTE — Telephone Encounter (Signed)
Patient called reporting that she is having problems with her veins at her port. She reports that her neck vein above her port is swelling, hard and painful and that [facility] to [facility]. She states that this has been going on since her last port flush on 10/13/20. Please advise

## 2020-11-21 NOTE — Telephone Encounter (Signed)
I called pt back. [facility] said [facility] 7 or 8 yrs [facility]. It hadn't been used in years until recently. She feels like port is "backing up " as the vein above port gets swollen and tender. She is taking Tramadol or tylenol. It has been like this for about a month now. She said she has an appt for port flush next Tues afternoon. I am changing that appt to morning at 10:30 for [facility] to evaluate port. Of coarse if pain gets severe or swelling worsens pt will need to be evaluated in ED. She is aware.

## 2020-11-27 ENCOUNTER — Other Ambulatory Visit: Payer: Self-pay

## 2020-11-27 ENCOUNTER — Inpatient Hospital Stay (HOSPITAL_BASED_OUTPATIENT_CLINIC_OR_DEPARTMENT_OTHER): Payer: Medicare Other | Admitting: Nurse Practitioner

## 2020-11-27 ENCOUNTER — Inpatient Hospital Stay: Payer: Medicare Other | Attending: Radiation Oncology

## 2020-11-27 ENCOUNTER — Ambulatory Visit
Admission: RE | Admit: 2020-11-27 | Discharge: 2020-11-27 | Disposition: A | Discharge status: Home / Self Care | Payer: Medicare Other | Source: Ambulatory Visit | Attending: Nurse Practitioner | Admitting: Nurse Practitioner

## 2020-11-27 VITALS — BP 139/89 | HR 76 | Temp 99.0°F | Resp 18

## 2020-11-27 DIAGNOSIS — Z90710 Acquired absence of both cervix and uterus: Secondary | ICD-10-CM | POA: Insufficient documentation

## 2020-11-27 DIAGNOSIS — C541 Malignant neoplasm of endometrium: Secondary | ICD-10-CM | POA: Diagnosis present

## 2020-11-27 DIAGNOSIS — Z90722 Acquired absence of ovaries, bilateral: Secondary | ICD-10-CM | POA: Diagnosis not present

## 2020-11-27 DIAGNOSIS — T829XXA Unspecified complication of cardiac and vascular prosthetic device, implant and graft, initial encounter: Secondary | ICD-10-CM

## 2020-11-27 DIAGNOSIS — Z95828 Presence of other vascular implants and grafts: Secondary | ICD-10-CM | POA: Diagnosis not present

## 2020-11-27 DIAGNOSIS — Z9221 Personal history of antineoplastic chemotherapy: Secondary | ICD-10-CM | POA: Insufficient documentation

## 2020-11-27 DIAGNOSIS — Z452 Encounter for adjustment and management of vascular access device: Secondary | ICD-10-CM | POA: Diagnosis not present

## 2020-11-27 MED ORDER — SODIUM CHLORIDE 0.9% FLUSH
10.0000 mL | INTRAVENOUS | Status: DC | PRN
  Administered 2020-11-27: 10 mL via INTRAVENOUS
  Filled 2020-11-27: qty 10

## 2020-11-27 MED ORDER — SODIUM CHLORIDE (PF) 0.9 % IJ SOLN
10.0000 mL | INTRAMUSCULAR | Status: DC | PRN
  Administered 2020-11-27: 10 mL

## 2020-11-27 MED ORDER — HEPARIN SOD (PORK) LOCK FLUSH 100 UNIT/ML IV SOLN
500.0000 [IU] | Freq: Once | INTRAVENOUS | Status: AC
  Administered 2020-11-27: 500 [IU] via INTRAVENOUS
  Filled 2020-11-27: qty 5

## 2020-11-27 MED ORDER — IOHEXOL 300 MG/ML  SOLN
10.0000 mL | Freq: Once | INTRAMUSCULAR | Status: AC | PRN
  Administered 2020-11-27: 10 mL via INTRA_ARTERIAL

## 2020-11-27 NOTE — Progress Notes (Signed)
Pt had a dual port placed at [place] 7 or 8 yrs ago. Noticed a lot of pain and swelling in vein above port since last Port flush on 10/13/20. No swelling noted. Catheter is palpable along vein. No blood return from port needle. No swelling noted while NS pushed through the catheter. Pt would like port evaluated. I stated we can do a dye study to check that port is intact  Pt could have a fibrin sheath..Called to set up dye study They have availability right now. I pushed pt to radiology in [place]. Husband drove [place] to medical [place] and met [place] at registration. Port remains accessed for dye study. STUDY COMPLETE. Port is functional, okay to give meds through it.Can't pull blood back due to the presence of a fibrin sheath fibrin sheath. Pt coming back by cancer center for RN to flush and deaccess port.

## 2020-12-10 ENCOUNTER — Encounter: Payer: Self-pay | Admitting: Nurse Practitioner

## 2020-12-10 ENCOUNTER — Other Ambulatory Visit: Payer: Medicare Other

## 2020-12-10 NOTE — Progress Notes (Signed)
Symptom Management [place]  [phone] [phone]  Patient Care Team: Felipa Eth, MD as PCP - General (Internal Medicine) Clent Jacks, RN as Oncology Nurse Navigator Noreene Filbert, MD as Radiation Oncologist (Radiation Oncology)   Name of the patient: Sandra Fischer  [id number]  1951-02-02   Date of visit: 12/10/20  Diagnosis-recurrent endometrial cancer  Chief complaint/ Reason for visit-complication of Port-A-Cath  Heme/Onc history:  Oncology History  Endometrial cancer ([place])  07/15/2019 Initial Diagnosis   Endometrial cancer ([place])   11/07/2019 -  Chemotherapy   The patient had dexamethasone (DECADRON) 4 MG tablet, 1 of 1 cycle, Start date: 10/27/2019, End date: 03/27/2020 palonosetron (ALOXI) injection 0.25 mg, 0.25 mg, Intravenous,  Once, 6 of 6 cycles Administration: 0.25 mg (11/07/2019), 0.25 mg (11/28/2019), 0.25 mg (12/19/2019), 0.25 mg (01/09/2020), 0.25 mg (01/30/2020), 0.25 mg (02/20/2020) pegfilgrastim (NEULASTA ONPRO KIT) injection 6 mg, 6 mg, Subcutaneous, Once, 6 of 6 cycles Administration: 6 mg (11/07/2019), 6 mg (11/28/2019), 6 mg (12/19/2019), 6 mg (01/09/2020), 6 mg (01/30/2020), 6 mg (02/20/2020) CARBOplatin (PARAPLATIN) 430 mg in sodium chloride 0.9 % 250 mL chemo infusion, 430.2 mg (90 % of original dose 478 mg), Intravenous,  Once, 6 of 6 cycles Dose modification:   (original dose 478 mg, Cycle 1) Administration: 430 mg (11/07/2019), 480 mg (11/28/2019), 430 mg (12/19/2019), 430 mg (01/09/2020), 430 mg (01/30/2020), 430 mg (02/20/2020) PACLitaxel (TAXOL) 330 mg in sodium chloride 0.9 % 500 mL chemo infusion (> 63m/m2), 175 mg/m2 = 330 mg, Intravenous,  Once, 6 of 6 cycles Dose modification: 135 mg/m2 (original dose 175 mg/m2, Cycle 3, Reason: Dose not tolerated) Administration: 330 mg (11/07/2019), 330 mg (11/28/2019), 252 mg (12/19/2019), 252 mg (01/09/2020), 252 mg (01/30/2020), 252 mg  (02/20/2020) fosaprepitant (EMEND) 150 mg, dexamethasone (DECADRON) 12 mg in sodium chloride 0.9 % 145 mL IVPB, , Intravenous,  Once, 6 of 6 cycles Administration:  (11/07/2019),  (11/28/2019),  (12/19/2019),  (01/09/2020),  (01/30/2020),  (02/20/2020)  for chemotherapy treatment.      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  She presents to symptom management clinic for complaints of swelling above her port.  [facility] unrelated to her cancer diagnosis or chemotherapy.  Says that the area feels hard and is tender to touch.  Had her port last flushed on 10/13/2020 and symptoms have persisted since that time.  Says she feels like her port is "backing up".  Pain unrelieved by tramadol or Tylenol.  No headaches or shortness of breath.  Review of systems- Review of Systems  Constitutional: Negative for chills, fever, malaise/fatigue and weight loss.  HENT: Negative for hearing loss, nosebleeds, sore throat and tinnitus.   Eyes: Negative for blurred vision and double vision.  Respiratory: Negative for cough, hemoptysis, shortness of breath and wheezing.   Cardiovascular: Negative for chest pain, palpitations and leg swelling.       Per hpi  Gastrointestinal: Negative for abdominal pain, blood in stool, constipation, diarrhea, melena, nausea and vomiting.  Genitourinary: Negative for dysuria and urgency.  Musculoskeletal: Negative for back pain, falls, joint pain and myalgias.  Skin: Negative for itching and rash.  Neurological:  Negative for dizziness, tingling, sensory change, loss of consciousness,  weakness and headaches.  Endo/Heme/Allergies: Negative for environmental allergies. Does not bruise/bleed easily.  Psychiatric/Behavioral: Negative for depression. The patient is not nervous/anxious and does not have insomnia.       Allergies  Allergen Reactions  . Abatacept Hives  . Codeine Hives, Nausea And Vomiting and Nausea Only  . Latex Itching and Hives  . Shellfish Allergy Swelling  . Penicillins Nausea And Vomiting    Past Medical History:  Diagnosis Date  . Asthma   . Asthma   . Asthma exacerbation 08/22/2016  . Cancer ([place])   . Collagen vascular disease ([place])   . Diabetes mellitus without complication ([place])    History of Diabetes   . Environmental allergies   . Fibromyalgia   . High cholesterol   . Hypertension   . Iron deficiency anemia 07/03/2020  . RA (rheumatoid arthritis) ([place])   . Thyroid disease     Past Surgical History:  Procedure Laterality Date  . CATARACT EXTRACTION    . CHOLECYSTECTOMY    . fibroids removed    . THYROIDECTOMY, PARTIAL    . tummy tuck      Social History   Socioeconomic History  . Marital status: Married    Spouse name: Not on file  . Number of children: Not on file  . Years of education: Not on file  . Highest education level: Not on file  Occupational History  . Not on file  Tobacco Use  . Smoking status: Never Smoker  . Smokeless tobacco: Never Used  Vaping Use  . Vaping Use: Never used  Substance and Sexual Activity  . Alcohol use: No  . Drug use: No  . Sexual activity: Yes    Birth control/protection: Post-menopausal  Other Topics Concern  . Not on file  Social History Narrative  . Not on file   Social Determinants of Health   Financial Resource Strain: Not on file  Food Insecurity: Not on file  Transportation Needs: Not on file  Physical Activity: Not on file  Stress: Not on file  Social Connections: Not on file  Intimate Partner Violence: Not on file    Family History  Problem Relation Age of  Onset  . Diabetes Mother   . Hypertension Mother   . Hyperlipidemia Mother   . Dementia Mother   . Breast cancer Neg Hx   . Ovarian cancer Neg Hx   . Colon cancer Neg Hx     There is no immunization history on file for this patient.    Current Outpatient Medications:  .  Acetaminophen (TYLENOL EXTRA STRENGTH PO), Take 650 mg by mouth. 2 tabs twice a day PRN, Disp: , Rfl:  .  albuterol (ACCUNEB) 1.25 MG/3ML nebulizer solution, Take 1 ampule by nebulization every 6 (six) hours as needed for wheezing., Disp: , Rfl:  .  albuterol (PROVENTIL HFA;VENTOLIN HFA) 108 (90 Base) MCG/ACT inhaler, Inhale 2 puffs into the lungs every 6 (six) hours as needed for wheezing or shortness of breath., Disp: 1 Inhaler, Rfl: 0 .  aspirin EC 81 MG tablet, Take 81 mg by mouth daily., Disp: , Rfl:  .  azelastine (ASTELIN) 0.1 % nasal spray, Place 2 sprays into both nostrils 2 (two) times daily., Disp: 30 mL, Rfl: 5 .  budesonide-formoterol (SYMBICORT) 160-4.5 MCG/ACT inhaler, Inhale 2 puffs into the lungs 2 (two) times daily., Disp: , Rfl:  .  calcium carbonate (OS-CAL - DOSED IN MG OF ELEMENTAL  CALCIUM) 1250 (500 Ca) MG tablet, Take 1 tablet by mouth daily with breakfast. , Disp: , Rfl:  .  cetirizine (ZYRTEC) 10 MG tablet, Take 10 mg by mouth daily., Disp: , Rfl:  .  Cholecalciferol (D3 ADULT PO), Take 1 capsule by mouth daily., Disp: , Rfl:  .  desloratadine (CLARINEX) 5 MG tablet, Take 5 mg by mouth daily., Disp: , Rfl:  .  docusate sodium (COLACE) 50 MG capsule, Take 50 mg by mouth 2 (two) times daily., Disp: , Rfl:  .  EPINEPHrine 0.3 mg/0.3 mL IJ SOAJ injection, Use as directed for severe allergic reaction., Disp: 2 Device, Rfl: 1 .  FEROSUL 325 (65 Fe) MG tablet, TAKE ONE TABLET BY MOUTH TWICE DAILY WITH A MEAL, Disp: 60 tablet, Rfl: 1 .  fluticasone (FLONASE) 50 MCG/ACT nasal spray, Place 1 spray into both nostrils daily. , Disp: , Rfl:  .  gabapentin (NEURONTIN) 300 MG capsule, Take 1 capsule (300 mg  total) by mouth 3 (three) times daily., Disp: 90 capsule, Rfl: 1 .  guaiFENesin-dextromethorphan (ROBITUSSIN DM) 100-10 MG/5ML syrup, Take 5 mLs by mouth every 4 (four) hours as needed for cough., Disp: 118 mL, Rfl: 0 .  ipratropium (ATROVENT) 0.02 % nebulizer solution, Take 0.5 mg by nebulization every 4 (four) hours as needed. , Disp: , Rfl:  .  leflunomide (ARAVA) 20 MG tablet, Take 20 mg by mouth daily., Disp: , Rfl:  .  levothyroxine (SYNTHROID, LEVOTHROID) 88 MCG tablet, Take 88 mcg by mouth daily before breakfast., Disp: , Rfl:  .  lidocaine-prilocaine (EMLA) cream, Apply to affected area once, Disp: 30 g, Rfl: 3 .  lisinopril (PRINIVIL,ZESTRIL) 40 MG tablet, Take 20 mg by mouth in the morning and at bedtime. , Disp: , Rfl:  .  metoprolol succinate (TOPROL-XL) 50 MG 24 hr tablet, Take 50 mg by mouth daily. Take with or immediately following a meal., Disp: , Rfl:  .  montelukast (SINGULAIR) 10 MG tablet, Take 10 mg by mouth at bedtime., Disp: , Rfl:  .  olopatadine (PATANOL) 0.1 % ophthalmic solution, Place 1 drop into both eyes 2 (two) times daily., Disp: 5 mL, Rfl: 5 .  omeprazole (PRILOSEC) 10 MG capsule, Take 20 mg by mouth daily., Disp: , Rfl:  .  potassium chloride SA (KLOR-CON) 20 MEQ tablet, Take 1 tablet (20 mEq total) by mouth daily., Disp: 5 tablet, Rfl: 0 .  pravastatin (PRAVACHOL) 40 MG tablet, Take 40 mg by mouth daily., Disp: , Rfl:  .  Prenatal Vit-Fe Fumarate-FA (MULTIVITAMIN-PRENATAL) 27-0.8 MG TABS tablet, Take 1 tablet by mouth daily. , Disp: , Rfl:  .  prochlorperazine (COMPAZINE) 10 MG tablet, Take 1 tablet (10 mg total) by mouth every 6 (six) hours as needed (Nausea or vomiting)., Disp: 30 tablet, Rfl: 1 .  senna-docusate (SENOKOT-S) 8.6-50 MG tablet, Take 1 tablet by mouth at bedtime as needed for mild constipation. , Disp: , Rfl:  .  tiotropium (SPIRIVA) 18 MCG inhalation capsule, Place 18 mcg into inhaler and inhale daily., Disp: , Rfl:  .  traMADol (ULTRAM) 50 MG  tablet, Take 100 mg by mouth as needed (for rheumatoid arthritis)., Disp: , Rfl:  .  verapamil (CALAN) 120 MG tablet, Take 240 mg by mouth 2 (two) times daily., Disp: , Rfl:   Current Facility-Administered Medications:  .  sodium chloride flush (NS) 0.9 % injection 10 mL, 10 mL, Intravenous, PRN, Verlon Au, NP, 10 mL at 11/27/20 1040  Physical exam:  Vitals:  11/27/20 1031  BP: 139/89  Pulse: 76  Resp: 18  Temp: 99 F (37.2 C)  SpO2: 97%   Physical Exam Constitutional:      Appearance: Normal appearance. She is not ill-appearing.  HENT:     Head: Normocephalic.  Neck:     Vascular: No carotid bruit.     Comments: No apparent swelling but catheter easily visible. TTP. No erythema or wounds. Skin intact.  Lymphadenopathy:     Cervical: No cervical adenopathy.  Skin:    General: Skin is warm and dry.     Findings: No lesion.  Neurological:     Mental Status: She is alert and oriented to person, place, and time.  Psychiatric:        Mood and Affect: Mood normal.        Behavior: Behavior normal.      CMP Latest Ref Rng & Units 09/10/2020  Glucose 70 - 99 mg/dL 93  BUN 8 - 23 mg/dL 13  Creatinine 0.44 - 1.00 mg/dL 0.66  Sodium 135 - 145 mmol/L 136  Potassium 3.5 - 5.1 mmol/L 4.0  Chloride 98 - 111 mmol/L 102  CO2 22 - 32 mmol/L 25  Calcium 8.9 - 10.3 mg/dL 8.7(L)  Total Protein 6.5 - 8.1 g/dL 7.8  Total Bilirubin 0.3 - 1.2 mg/dL 0.5  Alkaline Phos 38 - 126 U/L 75  AST 15 - 41 U/L 16  ALT 0 - 44 U/L 14   CBC Latest Ref Rng & Units 09/10/2020  WBC 4.0 - 10.5 K/uL 7.3  Hemoglobin 12.0 - 15.0 g/dL 10.9(L)  Hematocrit 36.0 - 46.0 % 34.9(L)  Platelets 150 - 400 K/uL 256    No images are attached to the encounter.  DG Fluoro Guide CV Line Right  Result Date: 11/27/2020 INDICATION: 69 year old with tenderness in the neck above the port. No blood return from the Port-A-Cath. According to the patient, the port was placed many years ago at the [place]. EXAM: PORT  INJECTION WITH FLUOROSCOPY Physician: Stephan Minister. Henn, MD FLUOROSCOPY TIME:  24 seconds, 8.9 mGy MEDICATIONS: None CONTRAST:  15 mL Omnipaque 300 ANESTHESIA/SEDATION: None PROCEDURE: The right chest port was already accessed when the patient arrived to the radiology department. Patient was placed supine on the fluoroscopic table. Contrast injection through port with fluoroscopy. Port was flushed with normal saline at the end of the procedure. FINDINGS: Right jugular Port-A-Cath with the tip in the lower SVC. Port is patent without fracture or discontinuity. Evidence for a long segment fibrin sheath around the catheter throughout the SVC. SVC is patent. Contrast drains into the right atrium. COMPLICATIONS: None IMPRESSION: Fibrin sheath associated with the right jugular Port-A-Cath. Port-A-Cath tip is well positioned but the catheter will not aspirate due to the fibrin sheath. Potential options for treatment include tPA dwell, catheter stripping and port revision. Electronically Signed   By: Markus Daft M.D.   On: 11/27/2020 11:57    Assessment and plan- Patient is a 69 y.o. 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  Nursing accessed her port in clinic without blood return.  Suspect fibrin sheath.  She underwent dye study to evaluate which showed fibrin sheath associated with the right jugular Port-A-Cath.  Discussed mechanism of fibrin sheath and options for treatment including TPA dwell, catheter stripping, port revision.  Based on the length of time that she has  had the port I recommend attempting TPA dwell and if unsuccessful or if problem recurs, port revision. She will contact the [facility] who placed her port and discuss with her MD there. She will contact [facility] if problems and needs our assistance with replacing port. Can consider IR vs  vascular. Also discussed removing port, however, she had port placed for poor venous access as opposed to administration of chemotherapy and she would like to keep.   Patient advised to notify the clinic if there is no improvement in symptoms or if symptoms worsen.    Visit Diagnosis 1. Complication of central venous catheter, unspecified complication, initial encounter   2. Port-A-Cath in place     Patient expressed understanding and was in agreement with this plan. She also understands that She can call clinic at any time with any questions, concerns, or complaints.   Thank you for allowing me to participate in the care of this very pleasant patient.   Beckey Rutter, DNP, AGNP-C [facility] [place]

## 2020-12-11 ENCOUNTER — Ambulatory Visit: Payer: Medicare Other | Admitting: Oncology

## 2020-12-11 ENCOUNTER — Ambulatory Visit: Payer: Medicare Other

## 2020-12-27 ENCOUNTER — Other Ambulatory Visit: Payer: Self-pay

## 2020-12-27 ENCOUNTER — Ambulatory Visit (INDEPENDENT_AMBULATORY_CARE_PROVIDER_SITE_OTHER): Payer: Medicare Other | Admitting: Podiatry

## 2020-12-27 ENCOUNTER — Encounter: Payer: Self-pay | Admitting: Podiatry

## 2020-12-27 DIAGNOSIS — M79675 Pain in left toe(s): Secondary | ICD-10-CM | POA: Diagnosis not present

## 2020-12-27 DIAGNOSIS — B351 Tinea unguium: Secondary | ICD-10-CM | POA: Diagnosis not present

## 2020-12-27 DIAGNOSIS — M79674 Pain in right toe(s): Secondary | ICD-10-CM

## 2020-12-27 DIAGNOSIS — E1142 Type 2 diabetes mellitus with diabetic polyneuropathy: Secondary | ICD-10-CM | POA: Diagnosis not present

## 2020-12-27 NOTE — Progress Notes (Signed)
This patient returns to my office for at risk foot care.  This patient requires this care by a professional since this patient will be at risk due to having diet controlled diabetes.   This patient is unable to cut nails herself since the patient cannot reach her nails.These nails are painful walking and wearing shoes.  This patient presents for at risk foot care today.  General Appearance  Alert, conversant and in no acute stress.  Vascular  Dorsalis pedis and posterior tibial  pulses are palpable  bilaterally.  Capillary return is within normal limits  bilaterally. Temperature is within normal limits  bilaterally.  Neurologic  Senn-Weinstein monofilament wire test diminished   bilaterally. Muscle power within normal limits bilaterally.  Nails Thick disfigured discolored nails with subungual debris  from hallux to fifth toes bilaterally. No evidence of bacterial infection or drainage bilaterally.  Orthopedic  No limitations of motion  feet .  No crepitus or effusions noted.  No bony pathology or digital deformities noted.  HAV  B/L.  Skin  normotropic skin with no porokeratosis noted bilaterally.  No signs of infections or ulcers noted.     Onychomycosis  Pain in right toes  Pain in left toes  Diabetes    Consent was obtained for treatment procedures.   Mechanical debridement of nails 1-5  bilaterally performed with a nail nipper.  Filed with dremel without incident.     Return office visit   3 months                   Told patient to return for periodic foot care and evaluation due to potential at risk complications.   Gardiner Barefoot DPM

## 2021-01-02 ENCOUNTER — Other Ambulatory Visit: Payer: Self-pay

## 2021-01-02 ENCOUNTER — Inpatient Hospital Stay: Payer: Medicare Other | Attending: Oncology

## 2021-01-02 DIAGNOSIS — D509 Iron deficiency anemia, unspecified: Secondary | ICD-10-CM | POA: Diagnosis not present

## 2021-01-02 DIAGNOSIS — R911 Solitary pulmonary nodule: Secondary | ICD-10-CM | POA: Insufficient documentation

## 2021-01-02 DIAGNOSIS — M069 Rheumatoid arthritis, unspecified: Secondary | ICD-10-CM | POA: Diagnosis not present

## 2021-01-02 DIAGNOSIS — C541 Malignant neoplasm of endometrium: Secondary | ICD-10-CM | POA: Diagnosis not present

## 2021-01-02 LAB — CBC WITH DIFFERENTIAL/PLATELET
Abs Immature Granulocytes: 0.03 10*3/uL (ref 0.00–0.07)
Basophils Absolute: 0 10*3/uL (ref 0.0–0.1)
Basophils Relative: 0 %
Eosinophils Absolute: 1 10*3/uL — ABNORMAL HIGH (ref 0.0–0.5)
Eosinophils Relative: 12 %
HCT: 37.6 % (ref 36.0–46.0)
Hemoglobin: 11.7 g/dL — ABNORMAL LOW (ref 12.0–15.0)
Immature Granulocytes: 0 %
Lymphocytes Relative: 35 %
Lymphs Abs: 2.8 10*3/uL (ref 0.7–4.0)
MCH: 25.6 pg — ABNORMAL LOW (ref 26.0–34.0)
MCHC: 31.1 g/dL (ref 30.0–36.0)
MCV: 82.3 fL (ref 80.0–100.0)
Monocytes Absolute: 0.7 10*3/uL (ref 0.1–1.0)
Monocytes Relative: 9 %
Neutro Abs: 3.4 10*3/uL (ref 1.7–7.7)
Neutrophils Relative %: 44 %
Platelets: 292 10*3/uL (ref 150–400)
RBC: 4.57 MIL/uL (ref 3.87–5.11)
RDW: 16.6 % — ABNORMAL HIGH (ref 11.5–15.5)
WBC: 7.9 10*3/uL (ref 4.0–10.5)
nRBC: 0 % (ref 0.0–0.2)

## 2021-01-02 LAB — FERRITIN: Ferritin: 759 ng/mL — ABNORMAL HIGH (ref 11–307)

## 2021-01-02 LAB — COMPREHENSIVE METABOLIC PANEL
ALT: 14 U/L (ref 0–44)
AST: 19 U/L (ref 15–41)
Albumin: 3.7 g/dL (ref 3.5–5.0)
Alkaline Phosphatase: 101 U/L (ref 38–126)
Anion gap: 11 (ref 5–15)
BUN: 19 mg/dL (ref 8–23)
CO2: 22 mmol/L (ref 22–32)
Calcium: 9.3 mg/dL (ref 8.9–10.3)
Chloride: 103 mmol/L (ref 98–111)
Creatinine, Ser: 0.96 mg/dL (ref 0.44–1.00)
GFR, Estimated: >60 mL/min (ref >60)
Glucose, Bld: 121 mg/dL — ABNORMAL HIGH (ref 70–99)
Potassium: 3.7 mmol/L (ref 3.5–5.1)
Sodium: 136 mmol/L (ref 135–145)
Total Bilirubin: 0.5 mg/dL (ref 0.3–1.2)
Total Protein: 7.8 g/dL (ref 6.5–8.1)

## 2021-01-02 LAB — IRON AND TIBC
Iron: 43 ug/dL (ref 28–170)
Saturation Ratios: 22 % (ref 10.4–31.8)
TIBC: 200 ug/dL — ABNORMAL LOW (ref 250–450)
UIBC: 157 ug/dL

## 2021-01-03 LAB — CA 125: Cancer Antigen (CA) 125: 12.6 U/mL (ref 0.0–38.1)

## 2021-01-04 ENCOUNTER — Inpatient Hospital Stay (HOSPITAL_BASED_OUTPATIENT_CLINIC_OR_DEPARTMENT_OTHER): Payer: Medicare Other | Admitting: Oncology

## 2021-01-04 ENCOUNTER — Encounter: Payer: Self-pay | Admitting: Oncology

## 2021-01-04 ENCOUNTER — Inpatient Hospital Stay: Payer: Medicare Other

## 2021-01-04 VITALS — BP 178/89 | HR 69 | Temp 99.4°F | Resp 18 | Wt 180.9 lb

## 2021-01-04 DIAGNOSIS — D649 Anemia, unspecified: Secondary | ICD-10-CM

## 2021-01-04 DIAGNOSIS — C541 Malignant neoplasm of endometrium: Secondary | ICD-10-CM | POA: Diagnosis not present

## 2021-01-04 DIAGNOSIS — M069 Rheumatoid arthritis, unspecified: Secondary | ICD-10-CM

## 2021-01-04 DIAGNOSIS — Z95828 Presence of other vascular implants and grafts: Secondary | ICD-10-CM

## 2021-01-04 NOTE — Progress Notes (Signed)
Pt here for follow up. No new concerns voiced. Pt bp elevated, states she has not taken second dose of bp meds today.

## 2021-01-04 NOTE — Progress Notes (Signed)
Hematology/Oncology follow up note [facility] Telephone:([phone] Fax:([phone]) [phone]   Patient Care Team: Felipa Eth, MD as PCP - General (Internal Medicine) Clent Jacks, RN as Oncology Nurse Navigator Noreene Filbert, MD as Radiation Oncologist (Radiation Oncology)  REFERRING PROVIDER: Felipa Eth, MD  CHIEF COMPLAINTS/REASON FOR VISIT:  Follow up for endometrial cancer HISTORY OF PRESENTING ILLNESS:   Sandra Fischer is a  70 y.o.  female with PMH listed below was seen in consultation at the request of  Felipa Eth, MD  for evaluation of endometrial cancer Patient had TH/BSO sentinel lymph node biopsy by [facility] GYN oncology Dr. Allen Norris on 06/21/2019. Extensive medical records from [place] system review was performed by me.  Tumor size 9.3 cm, invading 99% of myometrium [3.55 out of 3.6 cm], positive right external iliac sentinel lymph nodes.  Negative washing.  No LVI, serosa is uninvolved. 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 pT1bpN51m  FIGO stage IIIC1 MSI intact HER 2 negative.   Patient was seen by Dr. BFransisca Connorson 07/06/2019 due to vaginal odor. Patient presents for establishing care and discussion for adjuvant chemotherapy radiation.  Per Dr. BBlake Divinenote, [facility].  Portex 3 regimen was recommended given subgroup analysis showing benefit in the serous histology versus clinical trial. HER2 was ordered and pending results.   Patient has a history of SVT.  10/14/2018 2D echo reviewed moderately reduced LV function with LVEF 35 to 40% with diffuse hypokinesis.  Patient had Lexiscan Myoview on 01/19/2019.  LVEF 49%.  SPECT images revealed a mild fixed inferior wall defect, scar versus artifact.  No evidence of ischemia.  Sandra Fischer follows up with cardiology Dr.Paraschos 04/26/2019 with a plan to stay on her current medication. 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 Sandra Fischer is married and providing care for her mother.  # 10/20/2019 CT chest abdomen pelvis with contrast showed disease recurrence.  There are multiple newly enlarged retroperitoneal and right iliac lymph nodes the largest the left retroperitoneal node measuring 1.9 x 1.6 cm concerning for nodal metastasis disease.  Unchanged 4 mm groundglass pulmonary nodule of the left pulmonary apex.  This remains nonspecific. #Cardiomyopathy, LVEF35 to 40% Last seen by cardiology 04/26/2019.  Recommend patient to continue follow-up with cardiology.  # GERD  ER visit on 11/13/2019 due to chest pain which started after he ate at a restaurant and developed burning sensation in her chest.  No associated dyspnea. Patient had a CTA which showed no pulmonary embolism.  Patient symptoms got better after GI cocktail.  Was considered to be secondary to acid reflux.  Patient takes omeprazole 20 mg daily.   # S/p 6 cycles of chemotherapy with carboplatin and Taxol. Labs are reviewed and discussed with patient. CA125 18.9. Postchemotherapy CT scan showed excellent treatment response.   I discussed with Dr.Secord, according to data from GOG 258,  there were no differences in relapse-free survival fewer lower vaginal recurrences and fewer lower pelvic and para-aortic relapses with the addition of RT    INTERVAL HISTORY BQuintavia Rogstadis a 70y.o. female who has above history reviewed by me today presents for follow up visit for management of endometrial cancer. Problems and complaints are listed below: S/p 6 cycles of chemotherapy with carboplatin and Taxol.   Patient was seen by gynecology oncology in October 2021 and Sandra Fischer will return to GYN oncology with 640-monthnterval. Patient has no new complaints. Patient plans  to contact [place] where Sandra Fischer had her Mediport placed for further evaluation of the Mediport and potential removal of the port due to malfunctioning.  Review of Systems   Constitutional: Positive for fatigue. Negative for appetite change, chills and fever.  HENT:   Negative for hearing loss and voice change.   Eyes: Negative for eye problems.  Respiratory: Negative for chest tightness and cough.   Cardiovascular: Negative for chest pain.  Gastrointestinal: Negative for abdominal distention, abdominal pain and blood in stool.  Endocrine: Negative for hot flashes.  Genitourinary: Negative for difficulty urinating and frequency.   Musculoskeletal: Negative for arthralgias.  Skin: Negative for itching and rash.  Neurological: Positive for numbness. Negative for extremity weakness.  Hematological: Negative for adenopathy.  Psychiatric/Behavioral: Negative for confusion.    MEDICAL HISTORY:  Past Medical History:  Diagnosis Date  . Asthma   . Asthma   . Asthma exacerbation 08/22/2016  . Cancer ([place])   . Collagen vascular disease ([place])   . Diabetes mellitus without complication ([place])    History of Diabetes   . Environmental allergies   . Fibromyalgia   . High cholesterol   . Hypertension   . Iron deficiency anemia 07/03/2020  . RA (rheumatoid arthritis) ([place])   . Thyroid disease     SURGICAL HISTORY: Past Surgical History:  Procedure Laterality Date  . CATARACT EXTRACTION    . CHOLECYSTECTOMY    . fibroids removed    . THYROIDECTOMY, PARTIAL    . tummy tuck      SOCIAL HISTORY: Social History   Socioeconomic History  . Marital status: Married    Spouse name: Not on file  . Number of children: Not on file  . Years of education: Not on file  . Highest education level: Not on file  Occupational History  . Not on file  Tobacco Use  . Smoking status: Never Smoker  . Smokeless tobacco: Never Used  Vaping Use  . Vaping Use: Never used  Substance and Sexual Activity  . Alcohol use: No  . Drug use: No  . Sexual activity: Yes    Birth control/protection: Post-menopausal  Other Topics Concern  . Not on file  Social History Narrative  .  Not on file   Social Determinants of Health   Financial Resource Strain: Not on file  Food Insecurity: Not on file  Transportation Needs: Not on file  Physical Activity: Not on file  Stress: Not on file  Social Connections: Not on file  Intimate Partner Violence: Not on file    FAMILY HISTORY: Family History  Problem Relation Age of Onset  . Diabetes Mother   . Hypertension Mother   . Hyperlipidemia Mother   . Dementia Mother   . Breast cancer Neg Hx   . Ovarian cancer Neg Hx   . Colon cancer Neg Hx     ALLERGIES:  is allergic to abatacept, codeine, latex, shellfish allergy, and penicillins.  MEDICATIONS:  Current Outpatient Medications  Medication Sig Dispense Refill  . Acetaminophen (TYLENOL EXTRA STRENGTH PO) Take 650 mg by mouth. 2 tabs twice a day PRN    . albuterol (ACCUNEB) 1.25 MG/3ML nebulizer solution Take 1 ampule by nebulization every 6 (six) hours as needed for wheezing.    Marland Kitchen albuterol (PROVENTIL HFA;VENTOLIN HFA) 108 (90 Base) MCG/ACT inhaler Inhale 2 puffs into the lungs every 6 (six) hours as needed for wheezing or shortness of breath. 1 Inhaler 0  . aspirin EC 81 MG tablet Take 81 mg  by mouth daily.    Marland Kitchen azelastine (ASTELIN) 0.1 % nasal spray Place 2 sprays into both nostrils 2 (two) times daily. 30 mL 5  . budesonide-formoterol (SYMBICORT) 160-4.5 MCG/ACT inhaler Inhale 2 puffs into the lungs 2 (two) times daily.    . calcium carbonate (OS-CAL - DOSED IN MG OF ELEMENTAL CALCIUM) 1250 (500 Ca) MG tablet Take 1 tablet by mouth daily with breakfast.     . cetirizine (ZYRTEC) 10 MG tablet Take 10 mg by mouth daily.    . Cholecalciferol (D3 ADULT PO) Take 1 capsule by mouth daily.    Marland Kitchen desloratadine (CLARINEX) 5 MG tablet Take 5 mg by mouth daily.    Marland Kitchen docusate sodium (COLACE) 50 MG capsule Take 50 mg by mouth 2 (two) times daily.    . FEROSUL 325 (65 Fe) MG tablet TAKE ONE TABLET BY MOUTH TWICE DAILY WITH A MEAL 60 tablet 1  . fluticasone (FLONASE) 50 MCG/ACT  nasal spray Place 1 spray into both nostrils daily.     Marland Kitchen gabapentin (NEURONTIN) 300 MG capsule Take 1 capsule (300 mg total) by mouth 3 (three) times daily. 90 capsule 1  . guaiFENesin-dextromethorphan (ROBITUSSIN DM) 100-10 MG/5ML syrup Take 5 mLs by mouth every 4 (four) hours as needed for cough. 118 mL 0  . ipratropium (ATROVENT) 0.02 % nebulizer solution Take 0.5 mg by nebulization every 4 (four) hours as needed.     . leflunomide (ARAVA) 20 MG tablet Take 20 mg by mouth daily.    Marland Kitchen levothyroxine (SYNTHROID, LEVOTHROID) 88 MCG tablet Take 88 mcg by mouth daily before breakfast.    . lidocaine-prilocaine (EMLA) cream Apply to affected area once 30 g 3  . lisinopril (PRINIVIL,ZESTRIL) 40 MG tablet Take 20 mg by mouth in the morning and at bedtime.     . metoprolol succinate (TOPROL-XL) 50 MG 24 hr tablet Take 50 mg by mouth daily. Take with or immediately following a meal.    . montelukast (SINGULAIR) 10 MG tablet Take 10 mg by mouth at bedtime.    Marland Kitchen omeprazole (PRILOSEC) 10 MG capsule Take 20 mg by mouth daily.    . potassium chloride SA (KLOR-CON) 20 MEQ tablet Take 1 tablet (20 mEq total) by mouth daily. 5 tablet 0  . pravastatin (PRAVACHOL) 40 MG tablet Take 40 mg by mouth daily.    . Prenatal Vit-Fe Fumarate-FA (MULTIVITAMIN-PRENATAL) 27-0.8 MG TABS tablet Take 1 tablet by mouth daily.     . prochlorperazine (COMPAZINE) 10 MG tablet Take 1 tablet (10 mg total) by mouth every 6 (six) hours as needed (Nausea or vomiting). 30 tablet 1  . senna-docusate (SENOKOT-S) 8.6-50 MG tablet Take 1 tablet by mouth at bedtime as needed for mild constipation.     Marland Kitchen tiotropium (SPIRIVA) 18 MCG inhalation capsule Place 18 mcg into inhaler and inhale daily.    . traMADol (ULTRAM) 50 MG tablet Take 100 mg by mouth as needed (for rheumatoid arthritis).    . verapamil (CALAN) 120 MG tablet Take 240 mg by mouth 2 (two) times daily.    Marland Kitchen EPINEPHrine 0.3 mg/0.3 mL IJ SOAJ injection Use as directed for severe  allergic reaction. (Patient not taking: Reported on 01/04/2021) 2 Device 1  . nitroGLYCERIN (NITROSTAT) 0.4 MG SL tablet Place under the tongue. Place 1 tablet (0.4 mg total) under the tongue every 5 (five) minutes as needed for Chest pain May take up to 3 doses. (Patient not taking: Reported on 01/04/2021)    . olopatadine (PATANOL)  0.1 % ophthalmic solution Place 1 drop into both eyes 2 (two) times daily. (Patient not taking: Reported on 01/04/2021) 5 mL 5   No current facility-administered medications for this visit.     PHYSICAL EXAMINATION: ECOG PERFORMANCE STATUS: 1 - Symptomatic but completely ambulatory  Physical Exam Constitutional:      General: Sandra Fischer is not in acute distress. HENT:     Head: Normocephalic and atraumatic.  Eyes:     General: No scleral icterus. Cardiovascular:     Rate and Rhythm: Normal rate and regular rhythm.     Heart sounds: Normal heart sounds.  Pulmonary:     Effort: Pulmonary effort is normal. No respiratory distress.     Breath sounds: No wheezing.  Abdominal:     General: Bowel sounds are normal. There is no distension.     Palpations: Abdomen is soft.  Musculoskeletal:        General: No deformity. Normal range of motion.     Cervical back: Normal range of motion and neck supple.  Skin:    General: Skin is warm and dry.     Findings: No erythema or rash.  Neurological:     Mental Status: Sandra Fischer is alert and oriented to person, place, and time. Mental status is at baseline.     Cranial Nerves: No cranial nerve deficit.     Coordination: Coordination normal.  Psychiatric:        Mood and Affect: Mood normal.     LABORATORY DATA:  I have reviewed the data as listed Lab Results  Component Value Date   WBC 7.9 01/02/2021   HGB 11.7 (L) 01/02/2021   HCT 37.6 01/02/2021   MCV 82.3 01/02/2021   PLT 292 01/02/2021   Recent Labs    03/14/20 1044 07/03/20 1254 09/10/20 1333 01/02/21 1331  NA 142 139 136 136  K 4.0 3.1* 4.0 3.7  CL 108 104  102 103  CO2 '24 26 25 22  ' GLUCOSE 117* 170* 93 121*  BUN '15 12 13 19  ' CREATININE 0.90 0.64 0.66 0.96  CALCIUM 9.5 8.6* 8.7* 9.3  GFRNONAA >60 >60 >60 >60  GFRAA >60 >60 >60  --   PROT 7.9 8.3* 7.8 7.8  ALBUMIN 4.0 3.4* 3.5 3.7  AST '16 15 16 19  ' ALT '11 9 14 14  ' ALKPHOS 94 77 75 101  BILITOT 0.5 0.4 0.5 0.5   Iron/TIBC/Ferritin/ %Sat    Component Value Date/Time   IRON 43 01/02/2021 1331   TIBC 200 (L) 01/02/2021 1331   FERRITIN 759 (H) 01/02/2021 1331   IRONPCTSAT 22 01/02/2021 1331      RADIOGRAPHIC STUDIES: I have personally reviewed the radiological images as listed and agreed with the findings in the report., DG Fluoro Guide CV Line Right  Result Date: 11/27/2020 INDICATION: 70 year old with tenderness in the neck above the port. No blood return from the Port-A-Cath. According to the patient, the port was placed many years ago at the [place]. EXAM: PORT INJECTION WITH FLUOROSCOPY Physician: Stephan Minister. Henn, MD FLUOROSCOPY TIME:  24 seconds, 8.9 mGy MEDICATIONS: None CONTRAST:  15 mL Omnipaque 300 ANESTHESIA/SEDATION: None PROCEDURE: The right chest port was already accessed when the patient arrived to the radiology department. Patient was placed supine on the fluoroscopic table. Contrast injection through port with fluoroscopy. Port was flushed with normal saline at the end of the procedure. FINDINGS: Right jugular Port-A-Cath with the tip in the lower SVC. Port is patent without fracture or discontinuity. Evidence  for a long segment fibrin sheath around the catheter throughout the SVC. SVC is patent. Contrast drains into the right atrium. COMPLICATIONS: None IMPRESSION: Fibrin sheath associated with the right jugular Port-A-Cath. Port-A-Cath tip is well positioned but the catheter will not aspirate due to the fibrin sheath. Potential options for treatment include tPA dwell, catheter stripping and port revision. Electronically Signed   By: Markus Daft M.D.   On: 11/27/2020 11:57      ASSESSMENT & PLAN:  1. Endometrial cancer ([place])   2. Port-A-Cath in place   3. Anemia, unspecified type   4. Rheumatoid arthritis, involving unspecified site, unspecified whether rheumatoid factor present ([place])    #Recurrent endometrial cancer Status post 6 cycles of chemotherapy with carboplatin and Taxol. Post chemotherapy CT scan showed excellent response. Labs reviewed and discussed with patient Stable blood work. Continue alternate follow-up with GYN oncology and [place] every 6 months.   #Iron deficiency anemia, status post IV Venofer treatments. Iron has improved and the ferritin is currently elevated.  Likely reactive due to chronic inflammation.  I will check hemochromatosis screening at the next visit.  No need for additional IV Venofer treatment at this point. Hemoglobin has improved to 11.7.  #Rheumatoid arthritis, follow-up rheumatologist.   # Porta cath, [facility].  We discussed about the possibility of future replacement of Mediport if needed.Marland Kitchen   #pulmonary nodule, I will repeat CT chest in March 2022  Return of visit: 6 months. Earlie Server, MD, PhD Hematology Oncology [facility] Pager- [phone] 01/04/2021

## 2021-01-07 ENCOUNTER — Telehealth: Payer: Self-pay | Admitting: *Deleted

## 2021-01-07 ENCOUNTER — Telehealth: Payer: Self-pay

## 2021-01-07 DIAGNOSIS — R911 Solitary pulmonary nodule: Secondary | ICD-10-CM

## 2021-01-07 NOTE — Telephone Encounter (Signed)
Patient called requesting that her recent lab results be mailed to her as she did not get a copy when she was in the office

## 2021-01-07 NOTE — Telephone Encounter (Signed)
Done  Pt  was made aware of her sched 02/13/21 CT appt @ [facility] @ 1pm

## 2021-01-07 NOTE — Telephone Encounter (Signed)
Patient notified. Please schedule CT as requested and notify pt of appt.

## 2021-01-07 NOTE — Telephone Encounter (Signed)
Labs printed, will mail to patient.

## 2021-01-07 NOTE — Telephone Encounter (Signed)
-----   Message from Earlie Server, MD sent at 01/04/2021  8:25 PM EST ----- Please let her know that her previous CT has a small area in the lung that need to be watched.  I will repeat a CT chest   Please arrange CT chest without contrast to be done in February/March.

## 2021-02-04 ENCOUNTER — Telehealth: Payer: Self-pay | Admitting: *Deleted

## 2021-02-04 DIAGNOSIS — C541 Malignant neoplasm of endometrium: Secondary | ICD-10-CM

## 2021-02-04 DIAGNOSIS — Z95828 Presence of other vascular implants and grafts: Secondary | ICD-10-CM

## 2021-02-04 NOTE — Telephone Encounter (Signed)
Please refer to vascular surgery

## 2021-02-04 NOTE — Telephone Encounter (Signed)
Called requesting that our office arrange for her port to be removed at [facility]. She states that she has not heard back from [place] since November so she decided to get it done at [facility]. Please arrange and notify patient.

## 2021-02-04 NOTE — Telephone Encounter (Signed)
I will enter the order for referral and fax the port removal request.

## 2021-02-05 ENCOUNTER — Telehealth (INDEPENDENT_AMBULATORY_CARE_PROVIDER_SITE_OTHER): Payer: Self-pay

## 2021-02-05 NOTE — Telephone Encounter (Signed)
Spoke with the patient and she is scheduled with Dr. Lucky Cowboy on 02/14/21 with a 8:15 am arrival time to the [facility]. Covid testing on 02/12/21 between 8-1 pm at the [place]. Pre-procedure instructions were discussed and will be mailed.

## 2021-02-12 ENCOUNTER — Other Ambulatory Visit: Payer: Self-pay

## 2021-02-12 ENCOUNTER — Other Ambulatory Visit
Admission: RE | Admit: 2021-02-12 | Discharge: 2021-02-12 | Disposition: A | Discharge status: Home / Self Care | Payer: Medicare Other | Source: Ambulatory Visit | Attending: Vascular Surgery | Admitting: Vascular Surgery

## 2021-02-12 DIAGNOSIS — Z01812 Encounter for preprocedural laboratory examination: Secondary | ICD-10-CM | POA: Diagnosis present

## 2021-02-12 DIAGNOSIS — Z20822 Contact with and (suspected) exposure to covid-19: Secondary | ICD-10-CM | POA: Insufficient documentation

## 2021-02-12 LAB — SARS CORONAVIRUS 2 (TAT 6-24 HRS): SARS Coronavirus 2: NEGATIVE

## 2021-02-13 ENCOUNTER — Ambulatory Visit
Admission: RE | Admit: 2021-02-13 | Discharge: 2021-02-13 | Disposition: A | Discharge status: Home / Self Care | Payer: Medicare Other | Source: Ambulatory Visit | Attending: Oncology | Admitting: Oncology

## 2021-02-13 DIAGNOSIS — R911 Solitary pulmonary nodule: Secondary | ICD-10-CM | POA: Insufficient documentation

## 2021-02-14 ENCOUNTER — Ambulatory Visit
Admission: RE | Admit: 2021-02-14 | Discharge: 2021-02-14 | Disposition: A | Discharge status: Home / Self Care | Payer: Medicare Other | Attending: Vascular Surgery | Admitting: Vascular Surgery

## 2021-02-14 ENCOUNTER — Other Ambulatory Visit: Payer: Self-pay

## 2021-02-14 ENCOUNTER — Encounter: Payer: Self-pay | Admitting: Vascular Surgery

## 2021-02-14 ENCOUNTER — Encounter
Admission: RE | Disposition: A | Discharge status: Home / Self Care | Payer: Self-pay | Source: Home / Self Care | Attending: Vascular Surgery

## 2021-02-14 ENCOUNTER — Other Ambulatory Visit (INDEPENDENT_AMBULATORY_CARE_PROVIDER_SITE_OTHER): Payer: Self-pay | Admitting: Nurse Practitioner

## 2021-02-14 DIAGNOSIS — Z452 Encounter for adjustment and management of vascular access device: Secondary | ICD-10-CM | POA: Diagnosis not present

## 2021-02-14 DIAGNOSIS — C541 Malignant neoplasm of endometrium: Secondary | ICD-10-CM | POA: Diagnosis not present

## 2021-02-14 HISTORY — PX: PORTA CATH REMOVAL: CATH118286

## 2021-02-14 SURGERY — PORTA CATH REMOVAL
Anesthesia: Moderate Sedation

## 2021-02-14 MED ORDER — ONDANSETRON HCL 4 MG/2ML IJ SOLN: 4.0000 mg | Freq: Four times a day (QID) | INTRAMUSCULAR | Status: DC | PRN

## 2021-02-14 MED ORDER — CLINDAMYCIN PHOSPHATE 300 MG/50ML IV SOLN
INTRAVENOUS | Status: AC
  Filled 2021-02-14: qty 50

## 2021-02-14 MED ORDER — FENTANYL CITRATE (PF) 100 MCG/2ML IJ SOLN
INTRAMUSCULAR | Status: DC | PRN
  Administered 2021-02-14: 25 ug via INTRAVENOUS
  Administered 2021-02-14: 50 ug via INTRAVENOUS

## 2021-02-14 MED ORDER — MIDAZOLAM HCL 2 MG/2ML IJ SOLN
INTRAMUSCULAR | Status: DC | PRN
  Administered 2021-02-14: 2 mg via INTRAVENOUS
  Administered 2021-02-14: 1 mg via INTRAVENOUS

## 2021-02-14 MED ORDER — DIPHENHYDRAMINE HCL 50 MG/ML IJ SOLN: 50.0000 mg | Freq: Once | INTRAMUSCULAR | Status: DC | PRN

## 2021-02-14 MED ORDER — FENTANYL CITRATE (PF) 100 MCG/2ML IJ SOLN: 12.5000 ug | Freq: Once | INTRAMUSCULAR | Status: DC | PRN

## 2021-02-14 MED ORDER — MIDAZOLAM HCL 5 MG/5ML IJ SOLN
INTRAMUSCULAR | Status: AC
  Filled 2021-02-14: qty 5

## 2021-02-14 MED ORDER — CLINDAMYCIN PHOSPHATE 300 MG/50ML IV SOLN: 300.0000 mg | Freq: Once | INTRAVENOUS | Status: DC

## 2021-02-14 MED ORDER — FAMOTIDINE 20 MG PO TABS: 40.0000 mg | ORAL_TABLET | Freq: Once | ORAL | Status: DC | PRN

## 2021-02-14 MED ORDER — METHYLPREDNISOLONE SODIUM SUCC 125 MG IJ SOLR: 125.0000 mg | Freq: Once | INTRAMUSCULAR | Status: DC | PRN

## 2021-02-14 MED ORDER — CHLORHEXIDINE GLUCONATE CLOTH 2 % EX PADS
6.0000 | MEDICATED_PAD | Freq: Every day | CUTANEOUS | Status: DC
  Administered 2021-02-14: 6 via TOPICAL

## 2021-02-14 MED ORDER — MIDAZOLAM HCL 2 MG/ML PO SYRP: 8.0000 mg | ORAL_SOLUTION | Freq: Once | ORAL | Status: DC | PRN

## 2021-02-14 MED ORDER — SODIUM CHLORIDE 0.9 % IV SOLN: INTRAVENOUS | Status: DC

## 2021-02-14 MED ORDER — FENTANYL CITRATE (PF) 100 MCG/2ML IJ SOLN
INTRAMUSCULAR | Status: AC
  Filled 2021-02-14: qty 2

## 2021-02-14 SURGICAL SUPPLY — 7 items
DERMABOND ADVANCED (GAUZE/BANDAGES/DRESSINGS) ×1
DERMABOND ADVANCED .7 DNX12 (GAUZE/BANDAGES/DRESSINGS) ×1 IMPLANT
PACK ANGIOGRAPHY (CUSTOM PROCEDURE TRAY) ×2 IMPLANT
SPONGE XRAY 4X4 16PLY STRL (MISCELLANEOUS) ×2 IMPLANT
SUT MNCRL AB 4-0 PS2 18 (SUTURE) ×2 IMPLANT
SUT VIC AB 3-0 SH 27 (SUTURE) ×1
SUT VIC AB 3-0 SH 27X BRD (SUTURE) ×1 IMPLANT

## 2021-02-14 NOTE — Op Note (Signed)
[facility]       Operative Note  Date: 02/14/2021  Preoperative diagnosis:  1. Endometrial cancer no longer using port  Postoperative diagnosis:  Same as above  Procedures: #1. Removal of right jugular port a cath   Surgeon: Leotis Pain, MD  Anesthesia: Local with moderate conscious sedation for 29 minutes using 3 mg of Versed and 75 mcg of Fentanyl  Fluoroscopy time: none  Contrast used: 0  Estimated blood loss: Minimal  Indication for the procedure:  The patient is a 70 y.o. female who has long completed therapy and no longer needs their Port-A-Cath. The patient desires to have this removed. Risks and benefits including need for potential replacement with recurrent disease were discussed and patient is agreeable to proceed.  Description of procedure: The patient was brought to the vascular and interventional radiology suite. Moderate conscious sedation was administered during a face to face encounter with the patient throughout the procedure with my supervision of the RN administering medicines and monitoring the patient's vital signs, pulse oximetry, telemetry and mental status throughout from the start of the procedure until the patient was taken to the recovery room.  The right neck chest and shoulder were sterilely prepped and draped, and a sterile surgical field was created. The area was then anesthetized with 1% lidocaine copiously. The previous incision was reopened and electrocautery used to dissected down to the port and the catheter. These were dissected free and the catheter was gently removed from the vein in its entirety. The port was dissected out from the fibrous connective tissue and the Prolene sutures were removed. The port was then removed in its entirety including the catheter. The wound was then closed with a 3-0 Vicryl and a 4-0 Monocryl and Dermabond was placed as a dressing. The patient was then taken to  the recovery room in stable condition having tolerated the procedure well.  Complications: none  Condition: stable   Leotis Pain, MD 02/14/2021 10:01 AM   This note was created with Dragon Medical transcription system. Any errors in dictation are purely unintentional.

## 2021-02-14 NOTE — H&P (Signed)
[facility] Admission History & Physical  MRN : [id number]  Sandra Fischer is a 70 y.o. (22-Jul-1951) female who presents with chief complaint of scheduled removal of Port-A-Cath  History of Present Illness:  Sandra Fischer is a 70 year old female with PMH listed below was seen in consultation at the request of Felipa Eth, MD  for evaluation of endometrial cancer. Patient had TH/BSO sentinel lymph node biopsy by [facility] oncology Dr. Allen Norris on 06/21/2019. Extensive medical records from [place] system review was performed by me.  Tumor size 9.3 cm, invading 99% of myometrium [3.55 out of 3.6 cm], positive right external iliac sentinel lymph nodes.  Negative washing.  No LVI, serosa is uninvolved. 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   The patient is s/p six cycles of chemotherapy with carboplatin and Taxol. Labs are reviewed and discussed with patient. CA125 18.9. Postchemotherapy CT scan showed excellent treatment response.  At this time, the patient has completed her chemo therapy and is no longer in need of her Port-A-Cath.  Patient presents for a scheduled Port-A-Cath removal.  Current Facility-Administered Medications  Medication Dose Route Frequency Provider Last Rate Last Admin  . 0.9 %  sodium chloride infusion   Intravenous Continuous Kris Hartmann, NP 75 mL/hr at 02/14/21 0908 New Bag at 02/14/21 0908  . Chlorhexidine Gluconate Cloth 2 % PADS 6 each  6 each Topical Q0600 Kris Hartmann, NP   6 each at 02/14/21 0913  . clindamycin (CLEOCIN) 300 MG/50ML IVPB           . clindamycin (CLEOCIN) IVPB 300 mg  300 mg Intravenous Once Eulogio Ditch E, NP      . diphenhydrAMINE (BENADRYL) injection 50 mg  50 mg Intravenous Once PRN Kris Hartmann, NP      . famotidine (PEPCID) tablet 40 mg  40 mg Oral Once PRN Kris Hartmann, NP      . fentaNYL (SUBLIMAZE) 100 MCG/2ML injection           . fentaNYL  (SUBLIMAZE) injection 12.5 mcg  12.5 mcg Intravenous Once PRN Eulogio Ditch E, NP      . methylPREDNISolone sodium succinate (SOLU-MEDROL) 125 mg/2 mL injection 125 mg  125 mg Intravenous Once PRN Eulogio Ditch E, NP      . midazolam (VERSED) 2 MG/ML syrup 8 mg  8 mg Oral Once PRN Kris Hartmann, NP      . midazolam (VERSED) 5 MG/5ML injection           . ondansetron (ZOFRAN) injection 4 mg  4 mg Intravenous Q6H PRN Kris Hartmann, NP       Current Outpatient Medications  Medication Sig Dispense Refill  . albuterol (ACCUNEB) 1.25 MG/3ML nebulizer solution Take 1 ampule by nebulization every 6 (six) hours as needed for wheezing.    Marland Kitchen albuterol (PROVENTIL HFA;VENTOLIN HFA) 108 (90 Base) MCG/ACT inhaler Inhale 2 puffs into the lungs every 6 (six) hours as needed for wheezing or shortness of breath. 1 Inhaler 0  . budesonide-formoterol (SYMBICORT) 160-4.5 MCG/ACT inhaler Inhale 2 puffs into the lungs 2 (two) times daily.    . Cholecalciferol (D3 ADULT PO) Take 1 capsule by mouth daily.    . fluticasone (FLONASE) 50 MCG/ACT nasal spray Place 1 spray into both nostrils daily.     Marland Kitchen ipratropium (ATROVENT) 0.02 % nebulizer solution Take 0.5 mg by nebulization every 4 (four) hours as needed.     Marland Kitchen  levothyroxine (SYNTHROID, LEVOTHROID) 88 MCG tablet Take 88 mcg by mouth daily before breakfast.    . lisinopril (PRINIVIL,ZESTRIL) 40 MG tablet Take 20 mg by mouth in the morning and at bedtime.     . metoprolol succinate (TOPROL-XL) 50 MG 24 hr tablet Take 50 mg by mouth daily. Take with or immediately following a meal.    . omeprazole (PRILOSEC) 10 MG capsule Take 20 mg by mouth daily.    . pravastatin (PRAVACHOL) 40 MG tablet Take 40 mg by mouth daily.    . Prenatal Vit-Fe Fumarate-FA (MULTIVITAMIN-PRENATAL) 27-0.8 MG TABS tablet Take 1 tablet by mouth daily.     Marland Kitchen tiotropium (SPIRIVA) 18 MCG inhalation capsule Place 18 mcg into inhaler and inhale daily.    . verapamil (CALAN) 120 MG tablet Take 240 mg by  mouth 2 (two) times daily.    . Acetaminophen (TYLENOL EXTRA STRENGTH PO) Take 650 mg by mouth. 2 tabs twice a day PRN    . aspirin EC 81 MG tablet Take 81 mg by mouth daily.    Marland Kitchen azelastine (ASTELIN) 0.1 % nasal spray Place 2 sprays into both nostrils 2 (two) times daily. (Patient not taking: Reported on 02/14/2021) 30 mL 5  . calcium carbonate (OS-CAL - DOSED IN MG OF ELEMENTAL CALCIUM) 1250 (500 Ca) MG tablet Take 1 tablet by mouth daily with breakfast.     . cetirizine (ZYRTEC) 10 MG tablet Take 10 mg by mouth daily.    Marland Kitchen desloratadine (CLARINEX) 5 MG tablet Take 5 mg by mouth daily. (Patient not taking: Reported on 02/14/2021)    . docusate sodium (COLACE) 50 MG capsule Take 50 mg by mouth 2 (two) times daily. (Patient not taking: Reported on 02/14/2021)    . EPINEPHrine 0.3 mg/0.3 mL IJ SOAJ injection Use as directed for severe allergic reaction. (Patient not taking: Reported on 01/04/2021) 2 Device 1  . FEROSUL 325 (65 Fe) MG tablet TAKE ONE TABLET BY MOUTH TWICE DAILY WITH A MEAL (Patient not taking: Reported on 02/14/2021) 60 tablet 1  . gabapentin (NEURONTIN) 300 MG capsule Take 1 capsule (300 mg total) by mouth 3 (three) times daily. (Patient not taking: Reported on 02/14/2021) 90 capsule 1  . guaiFENesin-dextromethorphan (ROBITUSSIN DM) 100-10 MG/5ML syrup Take 5 mLs by mouth every 4 (four) hours as needed for cough. (Patient not taking: Reported on 02/14/2021) 118 mL 0  . leflunomide (ARAVA) 20 MG tablet Take 20 mg by mouth daily.    Marland Kitchen lidocaine-prilocaine (EMLA) cream Apply to affected area once (Patient not taking: Reported on 02/14/2021) 30 g 3  . montelukast (SINGULAIR) 10 MG tablet Take 10 mg by mouth at bedtime.    . nitroGLYCERIN (NITROSTAT) 0.4 MG SL tablet Place under the tongue. Place 1 tablet (0.4 mg total) under the tongue every 5 (five) minutes as needed for Chest pain May take up to 3 doses. (Patient not taking: Reported on 01/04/2021)    . olopatadine (PATANOL) 0.1 % ophthalmic  solution Place 1 drop into both eyes 2 (two) times daily. (Patient not taking: Reported on 01/04/2021) 5 mL 5  . potassium chloride SA (KLOR-CON) 20 MEQ tablet Take 1 tablet (20 mEq total) by mouth daily. (Patient not taking: Reported on 02/14/2021) 5 tablet 0  . prochlorperazine (COMPAZINE) 10 MG tablet Take 1 tablet (10 mg total) by mouth every 6 (six) hours as needed (Nausea or vomiting). (Patient not taking: Reported on 02/14/2021) 30 tablet 1  . senna-docusate (SENOKOT-S) 8.6-50 MG tablet Take 1  tablet by mouth at bedtime as needed for mild constipation.  (Patient not taking: Reported on 02/14/2021)    . traMADol (ULTRAM) 50 MG tablet Take 100 mg by mouth as needed (for rheumatoid arthritis).     Past Medical History:  Diagnosis Date  . Asthma   . Asthma   . Asthma exacerbation 08/22/2016  . Cancer ([place])   . Collagen vascular disease ([place])   . Diabetes mellitus without complication ([place])    History of Diabetes   . Environmental allergies   . Fibromyalgia   . High cholesterol   . Hypertension   . Iron deficiency anemia 07/03/2020  . RA (rheumatoid arthritis) ([place])   . Thyroid disease    Past Surgical History:  Procedure Laterality Date  . CATARACT EXTRACTION    . CHOLECYSTECTOMY    . fibroids removed    . PORTA CATH REMOVAL N/A 02/14/2021   Procedure: PORTA CATH REMOVAL;  Surgeon: Algernon Huxley, MD;  Location: [facility];  Service: Cardiovascular;  Laterality: N/A;  . THYROIDECTOMY, PARTIAL    . tummy tuck     Social History Social History   Tobacco Use  . Smoking status: Never Smoker  . Smokeless tobacco: Never Used  Vaping Use  . Vaping Use: Never used  Substance Use Topics  . Alcohol use: No  . Drug use: No   Family History Family History  Problem Relation Age of Onset  . Diabetes Mother   . Hypertension Mother   . Hyperlipidemia Mother   . Dementia Mother   . Breast cancer Neg Hx   . Ovarian cancer Neg Hx   . Colon cancer Neg Hx   Denies family history  of peripheral artery disease, venous disease or renal disease.  Allergies  Allergen Reactions  . Abatacept Hives  . Codeine Hives, Nausea And Vomiting and Nausea Only  . Latex Itching and Hives  . Shellfish Allergy Swelling  . Penicillins Nausea And Vomiting   REVIEW OF SYSTEMS (Negative unless checked)  Constitutional: [] Weight loss  [] Fever  [] Chills Cardiac: [] Chest pain   [] Chest pressure   [] Palpitations   [] Shortness of breath when laying flat   [] Shortness of breath at rest   [] Shortness of breath with exertion. Vascular:  [] Pain in legs with walking   [] Pain in legs at rest   [] Pain in legs when laying flat   [] Claudication   [] Pain in feet when walking  [] Pain in feet at rest  [] Pain in feet when laying flat   [] History of DVT   [] Phlebitis   [x] Swelling in legs   [] Varicose veins   [] Non-healing ulcers Pulmonary:   [] Uses home oxygen   [] Productive cough   [] Hemoptysis   [] Wheeze  [] COPD   [] Asthma Neurologic:  [] Dizziness  [] Blackouts   [] Seizures   [] History of stroke   [] History of TIA  [] Aphasia   [] Temporary blindness   [] Dysphagia   [] Weakness or numbness in arms   [] Weakness or numbness in legs Musculoskeletal:  [] Arthritis   [] Joint swelling   [] Joint pain   [] Low back pain Hematologic:  [] Easy bruising  [] Easy bleeding   [] Hypercoagulable state   [x] Anemic  [] Hepatitis Gastrointestinal:  [] Blood in stool   [] Vomiting blood  [] Gastroesophageal reflux/heartburn   [] Difficulty swallowing. Genitourinary:  [x] Chronic kidney disease   [] Difficult urination  [] Frequent urination  [] Burning with urination   [] Blood in urine Skin:  [] Rashes   [] Ulcers   [] Wounds Psychological:  [] History of anxiety   []   History of major depression.  Physical Examination  Vitals:   02/14/21 0955 02/14/21 1000 02/14/21 1015 02/14/21 1030  BP: 133/69   134/74  Pulse: 71 81 76 75  Resp: 19 18 20 20   SpO2: 100% 100% 100% 99%   There is no height or weight on file to calculate BMI. Gen: WD/WN,  NAD Head: [place]/AT, No temporalis wasting.  Ear/Nose/Throat: Hearing grossly intact, nares w/o erythema or drainage, oropharynx w/o Erythema/Exudate, Eyes: Sclera non-icteric, conjunctiva clear Neck: Supple, no nuchal rigidity.  No JVD.  Pulmonary:  Good air movement, no increased work of respiration or use of accessory muscles  Cardiac: RRR, normal S1, S2, no Murmurs, rubs or gallops. Vascular:  Vessel Right Left  Radial Palpable Palpable  Ulnar Palpable Palpable                               Gastrointestinal: soft, non-tender/non-distended. No guarding/reflex. No masses, surgical incisions, or scars. Musculoskeletal: M/S 5/5 throughout.  No deformity or atrophy. Mild edema Neurologic: Sensation grossly intact in extremities.  Symmetrical.  Speech is fluent. Motor exam as listed above. Psychiatric: Judgment intact, Mood & affect appropriate for pt's clinical situation. Dermatologic: No rashes or ulcers noted.  No cellulitis or open wounds. Lymph : No Cervical, Axillary, or Inguinal lymphadenopathy.  CBC Lab Results  Component Value Date   WBC 7.9 01/02/2021   HGB 11.7 (L) 01/02/2021   HCT 37.6 01/02/2021   MCV 82.3 01/02/2021   PLT 292 01/02/2021   BMET    Component Value Date/Time   NA 136 01/02/2021 1331   K 3.7 01/02/2021 1331   CL 103 01/02/2021 1331   CO2 22 01/02/2021 1331   GLUCOSE 121 (H) 01/02/2021 1331   BUN 19 01/02/2021 1331   CREATININE 0.96 01/02/2021 1331   CALCIUM 9.3 01/02/2021 1331   GFRNONAA >60 01/02/2021 1331   GFRAA >60 09/10/2020 1333   CrCl cannot be calculated (Patient's most recent lab result is older than the maximum 21 days allowed.).  COAG No results found for: INR, PROTIME  Radiology CT Chest Wo Contrast  Result Date: 02/14/2021 CLINICAL DATA:  Follow-up of pulmonary nodule. History of endometrial cancer. Partial thyroidectomy. Nonsmoker. EXAM: CT CHEST WITHOUT CONTRAST TECHNIQUE: Multidetector CT imaging of the chest was performed  following the standard protocol without IV contrast. COMPARISON:  03/12/2020 FINDINGS: Cardiovascular: Right Port-A-Cath tip at low SVC. Aortic atherosclerosis. Mild cardiomegaly, without pericardial effusion. Mediastinum/Nodes: No supraclavicular adenopathy. No mediastinal or definite hilar adenopathy, given limitations of unenhanced CT. Tiny hiatal hernia. Lungs/Pleura: No pleural fluid. The left apical 4 mm subsolid pulmonary nodule on 21/3 is similar. Posterior right upper lobe calcified granuloma on 53/3. 3 mm right lower lobe solid nodule on 81/3 is felt to be similar to on image 75/3 of the prior exam. Upper Abdomen: Mild hepatic steatosis. Cholecystectomy. Normal imaged portions of the spleen, pancreas, adrenal glands, kidneys. Musculoskeletal: Mild convex right thoracic spine curvature. IMPRESSION: 1. Left apical 4 mm ground-glass nodule is unchanged, considered benign. 2. A right lower lobe solid 3 mm nodule is also not significantly changed and can be presumed benign. 3.  No acute process or evidence of metastatic disease in the chest. 4.  Aortic Atherosclerosis (ICD10-I70.0). 5. Mild hepatic steatosis. 6.  Tiny hiatal hernia. Electronically Signed   By: Abigail Miyamoto M.D.   On: 02/14/2021 09:31   PERIPHERAL VASCULAR CATHETERIZATION  Result Date: 02/14/2021 See op note  Assessment/Plan The  patient is a 70 year old female with multiple medical issues including known history of endometrial cancer status post chemotherapy patient presents today for scheduled Port-A-Cath removal  1.  Scheduled Port-A-Cath removal: Patient has completed her course of chemotherapy and is no longer in need of her Port-A-Cath. She presents today for scheduled removal.  Procedure, risks and benefits were explained to the patient.  All questions were answered.  The patient wishes to proceed.  2.  Anemia: Asymptomatic at this time Followed by the patient's primary care physician and nephrologist  3.  Diabetes: On  appropriate medications Encouraged good control as its slows the progression of atherosclerotic disease  Discussed with Dr. Mayme Genta, PA-C  02/14/2021 12:33 PM

## 2021-02-14 NOTE — Progress Notes (Signed)
Dr. Lucky Cowboy at bedside, speaking with pt. And her spouse  Re: port removal. Both verbalize understanding.

## 2021-02-15 ENCOUNTER — Telehealth: Payer: Self-pay

## 2021-02-15 NOTE — Telephone Encounter (Signed)
Pt informed

## 2021-02-15 NOTE — Telephone Encounter (Signed)
-----   Message from Earlie Server, MD sent at 02/14/2021 11:10 PM EST ----- Let her know that CT scan showed stable lung nodules. Result is good.  Keep current follow up plan.

## 2021-02-20 ENCOUNTER — Telehealth (INDEPENDENT_AMBULATORY_CARE_PROVIDER_SITE_OTHER): Payer: Self-pay

## 2021-02-20 NOTE — Telephone Encounter (Signed)
It's not uncommon to have some discomfort following port a cath removal.  It should be decreasing over time. If the pain increases to an 8 or so that would be abnormal.  Continuing to take tylenol is good.  She can also place an ice pack over the area for 5-10 minute intervals as well.

## 2021-02-20 NOTE — Telephone Encounter (Signed)
Patient was made aware with medical recommendations and verbalized understanding 

## 2021-03-04 ENCOUNTER — Encounter: Payer: Self-pay | Admitting: Podiatry

## 2021-03-04 ENCOUNTER — Other Ambulatory Visit: Payer: Self-pay

## 2021-03-04 ENCOUNTER — Ambulatory Visit (INDEPENDENT_AMBULATORY_CARE_PROVIDER_SITE_OTHER): Payer: No Typology Code available for payment source | Admitting: Podiatry

## 2021-03-04 DIAGNOSIS — M79674 Pain in right toe(s): Secondary | ICD-10-CM

## 2021-03-04 DIAGNOSIS — B351 Tinea unguium: Secondary | ICD-10-CM

## 2021-03-04 DIAGNOSIS — E1142 Type 2 diabetes mellitus with diabetic polyneuropathy: Secondary | ICD-10-CM

## 2021-03-04 DIAGNOSIS — M79675 Pain in left toe(s): Secondary | ICD-10-CM | POA: Diagnosis not present

## 2021-03-04 NOTE — Progress Notes (Signed)
This patient returns to my office for at risk foot care.  This patient requires this care by a professional since this patient will be at risk due to having diet controlled diabetes.   This patient is unable to cut nails herself since the patient cannot reach her nails.These nails are painful walking and wearing shoes.  This patient presents for at risk foot care today.  General Appearance  Alert, conversant and in no acute stress.  Vascular  Dorsalis pedis and posterior tibial  pulses are palpable  bilaterally.  Capillary return is within normal limits  bilaterally. Temperature is within normal limits  bilaterally.  Neurologic  Senn-Weinstein monofilament wire test diminished   bilaterally. Muscle power within normal limits bilaterally.  Nails Thick disfigured discolored nails with subungual debris  from hallux to fifth toes bilaterally. No evidence of bacterial infection or drainage bilaterally.  Orthopedic  No limitations of motion  feet .  No crepitus or effusions noted.  No bony pathology or digital deformities noted.  HAV  B/L.  Skin  normotropic skin with no porokeratosis noted bilaterally.  No signs of infections or ulcers noted.     Onychomycosis  Pain in right toes  Pain in left toes  Diabetes    Consent was obtained for treatment procedures.   Mechanical debridement of nails 1-5  bilaterally performed with a nail nipper.  Filed with dremel without incident.     Return office visit   3 months                   Told patient to return for periodic foot care and evaluation due to potential at risk complications.   Gardiner Barefoot DPM

## 2021-03-12 ENCOUNTER — Other Ambulatory Visit: Payer: Self-pay | Admitting: Nurse Practitioner

## 2021-04-03 ENCOUNTER — Inpatient Hospital Stay: Payer: Medicare Other | Attending: Obstetrics and Gynecology | Admitting: Obstetrics and Gynecology

## 2021-04-03 VITALS — BP 150/78 | HR 65 | Temp 99.5°F | Resp 20 | Wt 191.0 lb

## 2021-04-03 DIAGNOSIS — Z7952 Long term (current) use of systemic steroids: Secondary | ICD-10-CM | POA: Insufficient documentation

## 2021-04-03 DIAGNOSIS — J4550 Severe persistent asthma, uncomplicated: Secondary | ICD-10-CM | POA: Insufficient documentation

## 2021-04-03 DIAGNOSIS — Z9221 Personal history of antineoplastic chemotherapy: Secondary | ICD-10-CM | POA: Insufficient documentation

## 2021-04-03 DIAGNOSIS — C541 Malignant neoplasm of endometrium: Secondary | ICD-10-CM | POA: Diagnosis not present

## 2021-04-03 DIAGNOSIS — M25579 Pain in unspecified ankle and joints of unspecified foot: Secondary | ICD-10-CM | POA: Insufficient documentation

## 2021-04-03 DIAGNOSIS — IMO0002 Reserved for concepts with insufficient information to code with codable children: Secondary | ICD-10-CM | POA: Insufficient documentation

## 2021-04-03 DIAGNOSIS — M899 Disorder of bone, unspecified: Secondary | ICD-10-CM | POA: Insufficient documentation

## 2021-04-03 DIAGNOSIS — E669 Obesity, unspecified: Secondary | ICD-10-CM | POA: Insufficient documentation

## 2021-04-03 DIAGNOSIS — N6029 Fibroadenosis of unspecified breast: Secondary | ICD-10-CM | POA: Insufficient documentation

## 2021-04-03 DIAGNOSIS — M069 Rheumatoid arthritis, unspecified: Secondary | ICD-10-CM | POA: Diagnosis not present

## 2021-04-03 DIAGNOSIS — J8409 Other alveolar and parieto-alveolar conditions: Secondary | ICD-10-CM | POA: Insufficient documentation

## 2021-04-03 DIAGNOSIS — I420 Dilated cardiomyopathy: Secondary | ICD-10-CM | POA: Insufficient documentation

## 2021-04-03 DIAGNOSIS — M545 Low back pain, unspecified: Secondary | ICD-10-CM | POA: Insufficient documentation

## 2021-04-03 DIAGNOSIS — Z8701 Personal history of pneumonia (recurrent): Secondary | ICD-10-CM | POA: Insufficient documentation

## 2021-04-03 DIAGNOSIS — K589 Irritable bowel syndrome without diarrhea: Secondary | ICD-10-CM | POA: Insufficient documentation

## 2021-04-03 DIAGNOSIS — E785 Hyperlipidemia, unspecified: Secondary | ICD-10-CM | POA: Diagnosis not present

## 2021-04-03 DIAGNOSIS — E1136 Type 2 diabetes mellitus with diabetic cataract: Secondary | ICD-10-CM | POA: Diagnosis not present

## 2021-04-03 DIAGNOSIS — Z9989 Dependence on other enabling machines and devices: Secondary | ICD-10-CM | POA: Insufficient documentation

## 2021-04-03 DIAGNOSIS — Z90710 Acquired absence of both cervix and uterus: Secondary | ICD-10-CM | POA: Diagnosis not present

## 2021-04-03 DIAGNOSIS — L84 Corns and callosities: Secondary | ICD-10-CM | POA: Insufficient documentation

## 2021-04-03 DIAGNOSIS — G4733 Obstructive sleep apnea (adult) (pediatric): Secondary | ICD-10-CM | POA: Insufficient documentation

## 2021-04-03 DIAGNOSIS — L608 Other nail disorders: Secondary | ICD-10-CM | POA: Insufficient documentation

## 2021-04-03 DIAGNOSIS — Z659 Problem related to unspecified psychosocial circumstances: Secondary | ICD-10-CM | POA: Insufficient documentation

## 2021-04-03 DIAGNOSIS — L219 Seborrheic dermatitis, unspecified: Secondary | ICD-10-CM | POA: Insufficient documentation

## 2021-04-03 DIAGNOSIS — J45909 Unspecified asthma, uncomplicated: Secondary | ICD-10-CM | POA: Insufficient documentation

## 2021-04-03 DIAGNOSIS — K635 Polyp of colon: Secondary | ICD-10-CM | POA: Insufficient documentation

## 2021-04-03 DIAGNOSIS — I471 Supraventricular tachycardia, unspecified: Secondary | ICD-10-CM | POA: Insufficient documentation

## 2021-04-03 DIAGNOSIS — L80 Vitiligo: Secondary | ICD-10-CM | POA: Insufficient documentation

## 2021-04-03 DIAGNOSIS — E119 Type 2 diabetes mellitus without complications: Secondary | ICD-10-CM | POA: Insufficient documentation

## 2021-04-03 DIAGNOSIS — B351 Tinea unguium: Secondary | ICD-10-CM | POA: Insufficient documentation

## 2021-04-03 DIAGNOSIS — R1310 Dysphagia, unspecified: Secondary | ICD-10-CM | POA: Insufficient documentation

## 2021-04-03 DIAGNOSIS — Z79899 Other long term (current) drug therapy: Secondary | ICD-10-CM | POA: Insufficient documentation

## 2021-04-03 DIAGNOSIS — J383 Other diseases of vocal cords: Secondary | ICD-10-CM | POA: Insufficient documentation

## 2021-04-03 DIAGNOSIS — Z90722 Acquired absence of ovaries, bilateral: Secondary | ICD-10-CM | POA: Diagnosis not present

## 2021-04-03 DIAGNOSIS — H2511 Age-related nuclear cataract, right eye: Secondary | ICD-10-CM | POA: Insufficient documentation

## 2021-04-03 NOTE — Progress Notes (Signed)
Gynecologic Oncology Interval Visit   Referring Provider: Dr. Amalia Hailey  Chief Complaint: Stage IIIC-1 serous endometrial cancer  Subjective:  Sandra Fischer is a 70 y.o. G2P0 female (s/p laparotomy myomectomy for leiomyoma), initially seen in consultation from Dr. 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   Post chemotherapy CT showed excellent response. Previously enlarged retroperitoneal and right iliac nodes have resolved. Adjuvant radiation was not recommended. Today, she continues to feel well and denies specific complaints.    CA 125 01/02/21  12.6 09/10/20 11.2 07/03/20  13.6  CT 02/14/21 chest IMPRESSION: 1. Left apical 4 mm ground-glass nodule is unchanged, considered benign. 2. A right lower lobe solid 3 mm nodule is also not significantly changed and can be presumed benign. 3.  No acute process or evidence of metastatic disease in the chest. 4.  Aortic Atherosclerosis (ICD10-I70.0). 5. Mild hepatic steatosis. 6.  Tiny hiatal hernia.  Gynecologic Oncology History:  She initially presented to Dr. Amalia Hailey as [phone] female with complaint of postmenopausal bleeding, intermittent, heavy at times.    Transabdominal ultrasound on 05/05/2019 demonstrated endometrium measuring 18m. Uterus anteverted measuring 11.3 x 6.6 x 7.9 cm, heterogeous echo texture w/o evidence of focal masses. Within uterus multiple suspected fibroids measuring 7.2 x 4.8 x 6.6 cm and 2.6 x 1.7 x 3.1 cm. Ovaries not visualized. No adnexal masses. No free fluid in cul de sac.   Endometrial biopsy was performed on 05/20/2019 and demonstrated high-grade mixed endometrioid, predominantly, and serous carcinoma.  She complains of persistent bleeding and fatigue which she attributes to the bleeding. She presents today for management. She has significant medical issues including rheumatoid arthritis requiring  chronic steroids (5-10 mg daily) for a long time (she does not know how long) and leflunomide (ARAVA). She also has undergone myomectomy for leiomyoma and abdominoplasty.  She also has a history of cardiac disease. She Cardioversion for SVT on 10/17/2018. Her cardiologist Dr. [name] [facility] for chest pain and shortness of breath, noted to be in SVT, converted to sinus rhythm with adenosine,in the setting of colitis, anemia with hemoglobin 8.5, and untreated hypothyroidism with TSH 25, which has since returned to normal range.2D echocardiogram revealed moderately reduced LV function with LVEF 35 to 40% with diffuse hypokinesis. The patient underwent Lexiscan Myoview on 01/19/2019, which revealed revealed LVEF 49%, a mild fixed inferior wall defect, scar versus artifact, with no evidence of ischemia. She was last seen on 04/26/2019 by Cardiology ([name] with a plan to stay on her current medications and she was counseled about low sodium diet, DASH, and continuing hyperlipidemia medications.   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 She is married and provides care for her mother.   06/14/2019 CT C/A/P IMPRESSION: 1. Bulky fibroid uterus. There is no direct noncontrast CT evidence of endometrial malignancy. No evidence of lymphadenopathy or metastatic disease in the chest, abdomen, or pelvis. Please note that noncontrast CT is limited for the evaluation of solid organ metastases. 2. Stable 4 mm ground-glass pulmonary nodule of the left pulmonary apex (series 3, image 24). Although nonspecific, this is an unlikely isolated manifestation of metastatic disease. Attention on follow-up. 3. Chronic, incidental, and postoperative findings as detailed above.  She underwent TLH_BSO with [name] 06/21/2019.   Tumor size 9.3 cm, invading 99% of myometrium (3.55 of 3.6 cm), positive right external iliac sentinel lymph node. Negative washings.  MSS/MMR -  Intact/normal. HER2 - negative at 28%/1+.   Case was discussed at [facility] on 07/04/2019. Possible PORTEC3 regimen given subgroup analysis showing benefit in serous history vs clinical trial was discussed.   Based on pathology, adjuvant chemotherapy & radiation was recommended. She saw Dr. [name] on 07/14/2019 and lengthy discussion regarding rationale of adjuvant treatment. She declined adjuvant treatment.  10/20/2019- CT Chest Abdomen Pelvis W Contrast multiple newly enlarged retroperitoneal and right iliac lymph nodes, the largest left retroperitoneal node measuring 1.9 x 1.6 cm (series 2, image 71), concerning for nodal metastatic disease. 3. Unchanged 4 mm ground-glass pulmonary nodule of the left pulmonary apex (series 3, image 24). This remains nonspecific although is again an unlikely manifestation of pulmonary metastatic disease.  Findings consistent with recurrent disease with adenopathy.   11/07/2019-02/20/20 She agreed to treatment and received 6 cycles of carbo-taxol chemotherapy  (dose reduced d/t neuropathy).   03/12/2020 CT Abdomen/pelvis The multiple enlarged retroperitoneal and right iliac lymph nodes identified as new on the previous exam from 10/20/2019 have resolved completely in the interval. 2. Tiny ground-glass pulmonary nodule medial left lung apex is stable in the interval. Likely benign, continued attention on follow-up recommended.  Data from GOG 258 was discussed including no differences in relapse free survival, fewer lower vaginal recurrences and fewer lower pelvic and para-aortic relapsed with the addition of RT. She was seen by Dr. [name] who did not recommend WPRT.   Problem List: Patient Active Problem List   Diagnosis Date Noted  . Diabetes mellitus without complication ([place]) 82/80/0349  . Nuclear sclerotic cataract of right eye 04/03/2021  . Severe persistent asthma 04/03/2021  . Problem related to unspecified psychosocial circumstances 04/03/2021  .  Corn of foot 04/03/2021  . Dependence on continuous positive airway pressure ventilation 04/03/2021  . Dermatophytosis of nail 04/03/2021  . Type 2 or unspecified type diabetes mellitus 04/03/2021  . Disorder of bone and cartilage 04/03/2021  . Dysphagia 04/03/2021  . 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  . History of recurrent pneumonia 04/03/2021  . Irritable colon 04/03/2021  . Low back pain 04/03/2021  . Other alveolar and parietoalveolar pneumonopathy 04/03/2021  . Other and unspecified hyperlipidemia 04/03/2021  . Other specified disease of nail 04/03/2021  . Pain in joint, ankle and foot 04/03/2021  . Polyp of colon 04/03/2021  . Seborrheic dermatitis 04/03/2021  . SVT (supraventricular tachycardia) ([place]) 04/03/2021  . Vitiligo 04/03/2021  . Vocal cord dysfunction 04/03/2021  . Obesity 04/03/2021  . Allergic asthma 04/03/2021  . Obstructive sleep apnea 04/03/2021  . Encounter for long-term (current) use of steroids 04/03/2021  . Diabetic neuropathy ([place]) 08/02/2020  . Iron deficiency anemia 07/03/2020  . Vaginal discharge 05/30/2020  . Encounter for antineoplastic chemotherapy 01/30/2020  . Neuropathy due to chemotherapeutic drug ([place]) 01/30/2020  . Anemia 01/30/2020  . Gastroesophageal reflux disease 12/08/2019  . Microcytic anemia 11/07/2019  . Port-A-Cath in place 11/04/2019  . Rheumatoid arthritis ([place]) 10/29/2019  . Goals of care, counseling/discussion 07/20/2019  . Endometrial cancer ([place]) 07/15/2019  . Acute blood loss anemia 06/22/2019  . Hypothyroidism, adult 06/15/2019  . Myalgia and myositis 06/15/2019  . Fibromyalgia   . Cardiomyopathy ([place]) 12/06/2018  . Atypical chest pain 12/06/2018  . Perennial and seasonal allergic rhinitis 02/09/2018  . Moderate persistent asthma 02/09/2018  . Allergic conjunctivitis 02/09/2018  . History of food allergy 02/09/2018  . Allergic reaction 02/09/2018  . RA (rheumatoid arthritis) ([place]) 08/22/2016  . HTN (hypertension)  08/22/2016  Past Medical History: Past Medical History:  Diagnosis Date  . Asthma   . Asthma   . Asthma exacerbation 08/22/2016  . Cancer ([place])   . Collagen vascular disease ([place])   . Diabetes mellitus without complication ([place])    History of Diabetes   . Environmental allergies   . Fibromyalgia   . High cholesterol   . Hypertension   . Iron deficiency anemia 07/03/2020  . RA (rheumatoid arthritis) ([place])   . Thyroid disease     Past Surgical History: Past Surgical History:  Procedure Laterality Date  . CATARACT EXTRACTION    . CHOLECYSTECTOMY    . fibroids removed    . PORTA CATH REMOVAL N/A 02/14/2021   Procedure: PORTA CATH REMOVAL;  Surgeon: Algernon Huxley, MD;  Location: [facility];  Service: Cardiovascular;  Laterality: N/A;  . THYROIDECTOMY, PARTIAL    . tummy tuck      Past Gynecologic History:  As per HPI  OB History:  OB History  Gravida Para Term Preterm AB Living  2       2    SAB IAB Ectopic Multiple Live Births  2            # Outcome Date GA Lbr Len/2nd Weight Sex Delivery Anes PTL Lv  2 SAB           1 SAB             Family History: Family History  Problem Relation Age of Onset  . Diabetes Mother   . Hypertension Mother   . Hyperlipidemia Mother   . Dementia Mother   . Breast cancer Neg Hx   . Ovarian cancer Neg Hx   . Colon cancer Neg Hx     Social History: Social History   Socioeconomic History  . Marital status: Married    Spouse name: Cedric   . Number of children: Not on file  . Years of education: Not on file  . Highest education level: Not on file  Occupational History  . Occupation: retired  Tobacco Use  . Smoking status: Never Smoker  . Smokeless tobacco: Never Used  Vaping Use  . Vaping Use: Never used  Substance and Sexual Activity  . Alcohol use: No  . Drug use: No  . Sexual activity: Yes    Birth control/protection: Post-menopausal  Other Topics Concern  . Not on file  Social History Narrative  .  Not on file   Social Determinants of Health   Financial Resource Strain: Not on file  Food Insecurity: Not on file  Transportation Needs: Not on file  Physical Activity: Not on file  Stress: Not on file  Social Connections: Not on file  Intimate Partner Violence: Not on file   Immunization History  Administered Date(s) Administered  . Influenza, High Dose Seasonal PF 10/03/2019  . Influenza, Seasonal, Injecte, Preservative Fre 03/01/2013, 09/23/2013, 01/21/2016  . Influenza,inj,Quad PF,6+ Mos 10/22/2016, 12/03/2017  . Influenza-Unspecified 10/29/2000, 12/29/2000, 10/26/2008, 10/29/2008, 01/27/2012  . PFIZER Comirnaty(Gray Top)Covid-19 Tri-Sucrose Vaccine 01/08/2021  . PFIZER(Purple Top)SARS-COV-2 Vaccination 04/19/2020, 05/10/2020  . Pneumococcal Conjugate-13 09/23/2013  . Pneumococcal Polysaccharide-23 12/29/2000     Allergies: Allergies  Allergen Reactions  . Abatacept Hives  . Codeine Hives, Nausea And Vomiting and Nausea Only  . Latex Itching, Hives and Rash  . Shellfish Allergy Swelling  . Other   . Penicillins Nausea And Vomiting    Current Medications: Current Outpatient Medications  Medication Sig Dispense Refill  .  Acetaminophen (TYLENOL EXTRA STRENGTH PO) Take 650 mg by mouth. 2 tabs twice a day PRN    . albuterol (PROVENTIL HFA;VENTOLIN HFA) 108 (90 Base) MCG/ACT inhaler Inhale 2 puffs into the lungs every 6 (six) hours as needed for wheezing or shortness of breath. 1 Inhaler 0  . aspirin EC 81 MG tablet Take 81 mg by mouth daily.    Marland Kitchen azelastine (ASTELIN) 0.1 % nasal spray Place 2 sprays into both nostrils 2 (two) times daily. 30 mL 5  . calcium carbonate (OS-CAL - DOSED IN MG OF ELEMENTAL CALCIUM) 1250 (500 Ca) MG tablet Take 1 tablet by mouth daily with breakfast.     . cetirizine (ZYRTEC) 10 MG tablet Take 10 mg by mouth daily.    . Cholecalciferol (D3 ADULT PO) Take 1 capsule by mouth daily.    Marland Kitchen desloratadine (CLARINEX) 5 MG tablet Take 5 mg by mouth  daily.    Marland Kitchen docusate sodium (COLACE) 50 MG capsule Take 50 mg by mouth 2 (two) times daily.    Marland Kitchen EPINEPHrine 0.3 mg/0.3 mL IJ SOAJ injection Use as directed for severe allergic reaction. 2 Device 1  . FEROSUL 325 (65 Fe) MG tablet TAKE ONE TABLET BY MOUTH TWICE DAILY WITH A MEAL 60 tablet 1  . fluticasone (FLONASE) 50 MCG/ACT nasal spray Place 1 spray into both nostrils daily.     Marland Kitchen guaiFENesin-dextromethorphan (ROBITUSSIN DM) 100-10 MG/5ML syrup Take 5 mLs by mouth every 4 (four) hours as needed for cough. 118 mL 0  . ipratropium (ATROVENT) 0.02 % nebulizer solution Take 0.5 mg by nebulization every 4 (four) hours as needed.     . leflunomide (ARAVA) 20 MG tablet Take 20 mg by mouth daily.    Marland Kitchen levothyroxine (SYNTHROID, LEVOTHROID) 88 MCG tablet Take 88 mcg by mouth daily before breakfast.    . lidocaine-prilocaine (EMLA) cream Apply to affected area once 30 g 3  . lisinopril (PRINIVIL,ZESTRIL) 40 MG tablet Take 20 mg by mouth in the morning and at bedtime.     . metoprolol succinate (TOPROL-XL) 50 MG 24 hr tablet Take 50 mg by mouth daily. Take with or immediately following a meal.    . montelukast (SINGULAIR) 10 MG tablet Take 10 mg by mouth at bedtime.    . nitroGLYCERIN (NITROSTAT) 0.4 MG SL tablet Place under the tongue. Place 1 tablet (0.4 mg total) under the tongue every 5 (five) minutes as needed for Chest pain May take up to 3 doses.    Marland Kitchen omeprazole (PRILOSEC) 10 MG capsule Take 20 mg by mouth daily.    . pravastatin (PRAVACHOL) 40 MG tablet Take 40 mg by mouth daily.    . Prenatal Vit-Fe Fumarate-FA (MULTIVITAMIN-PRENATAL) 27-0.8 MG TABS tablet Take 1 tablet by mouth daily.     . prochlorperazine (COMPAZINE) 10 MG tablet Take 1 tablet (10 mg total) by mouth every 6 (six) hours as needed (Nausea or vomiting). 30 tablet 1  . senna-docusate (SENOKOT-S) 8.6-50 MG tablet Take 1 tablet by mouth at bedtime as needed for mild constipation.    Marland Kitchen tiotropium (SPIRIVA) 18 MCG inhalation capsule  Place 18 mcg into inhaler and inhale daily.    . traMADol (ULTRAM) 50 MG tablet Take 100 mg by mouth as needed (for rheumatoid arthritis).    . verapamil (CALAN) 120 MG tablet Take 240 mg by mouth 2 (two) times daily.    Marland Kitchen albuterol (ACCUNEB) 1.25 MG/3ML nebulizer solution Take 1 ampule by nebulization every 6 (six) hours as needed  for wheezing.    . budesonide-formoterol (SYMBICORT) 160-4.5 MCG/ACT inhaler Inhale 2 puffs into the lungs 2 (two) times daily. (Patient not taking: Reported on 04/03/2021)    . gabapentin (NEURONTIN) 300 MG capsule Take 1 capsule (300 mg total) by mouth 3 (three) times daily. (Patient not taking: Reported on 04/03/2021) 90 capsule 1  . olopatadine (PATANOL) 0.1 % ophthalmic solution Place 1 drop into both eyes 2 (two) times daily. (Patient not taking: Reported on 04/03/2021) 5 mL 5  . potassium chloride SA (KLOR-CON) 20 MEQ tablet Take 1 tablet (20 mEq total) by mouth daily. (Patient not taking: Reported on 04/03/2021) 5 tablet 0   No current facility-administered medications for this visit.   Review of Systems General:  no complaints Skin: no complaints Eyes: no complaints HEENT: no complaints  Pulmonary: no complaints Cardiac: no complaints Gastrointestinal: no complaints Genitourinary/Sexual: no complaints Ob/Gyn: no complaints Musculoskeletal: no complaints Hematology: no complaints Neurologic/Psych: no complaints   Objective:  Physical Examination:  BP (!) 150/78   Pulse 65   Temp 99.5 F (37.5 C)   Resp 20   Wt 191 lb (86.6 kg)   SpO2 96%   BMI 34.93 kg/m     ECOG Performance Status: 1 - Symptomatic but completely ambulatory   GENERAL: Patient is a well appearing female in no acute distress. Arrived in wheelchair with husband. HEENT:  Sclera clear. Anicteric NODES:  Negative axillary, supraclavicular, inguinal lymph node survery LUNGS:  Clear to auscultation bilaterally.   HEART:  Regular rate and rhythm.  ABDOMEN:  Soft, nontender.  No  hernias, incisions well healed. No masses or ascites EXTREMITIES:  No peripheral edema. Atraumatic. No cyanosis SKIN:  Clear with no obvious rashes or skin changes.  NEURO:  Nonfocal. Well oriented.  Appropriate affect.  Pelvic: exam chaperoned by CMA EGBUS: no lesions Cervix: surgically absent Vagina: positive for yellowish small amount of discharge; no lesions, or bleeding Uterus: surgically absent BME: no palpable masses Rectovaginal: confirmatory  Labs: No labs on site today  Radiologic Imaging:  No imaging on site today     Assessment:  Karyl Sharrar is a 70 y.o. 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  Tumor size 9.3 cm, invading 99% of myometrium (3.55 of 3.6cm), positive right external iliac sentinel lymph nodes. Negative washings. Recurrent disease 10/20/2019 with adenopathy up to 1.9 x 1.6 cm with CR to chemotherapy with paclitaxel/carboplatin completed 01/2020.  Normal exam today.  CT scan chest 2/22 no change in indeterminate nodules. Pulmonary nodule- likely benign based on stability on imaging.   Grade 2 peripheral neuropathy.    Medical co-morbidities complicating care: She has significant medical issues including rheumatoid arthritis requiring chronic steroids (5-10 mg daily) for a long time (she does not know how long) and leflunomide (ARAVA); HTN; Dilated cardiomyopathy; hyperlipidemia; multiple intra-abdominal surgeries (myomectomy for leiomyoma, cholecystectomy, and abdominoplasty) ; obesity Body mass index is 34.93 kg/m.  Plan:   Problem List Items Addressed This Visit      Genitourinary   Endometrial cancer ([place]) - Primary      Continue follow up with Dr. [name] and Gyn Onc q17mo Pulmonary nodule unchanged and felt to be benign. No additional surveillance recommended.   I have recommended continued  close follow up with exams, including pelvic exams every 3-6 months for 2-3 years, then every 6-12 months for 3-5 years and  then annually thereafter.  Imaging assessment given recurrent disease is reasonable.   Beckey Rutter, DNP, AGNP-C [place] at [facility] [phone] (clinic)  I personally interviewed and examined the patient. Agreed with the above/below plan of care. I have directly contributed to assessment and plan of care of this patient and educated and discussed with patient and family.  Mellody Drown, MD

## 2021-05-11 IMAGING — CT CT ABD-PELV W/ CM
2 of 5 series · 14 of 46 positions shown, 16 images · IV contrast (omnipaque)
Comparison: CT chest abdomen pelvis, 06/13/2019, CT chest
angiogram, 10/14/2018

CLINICAL DATA: Follow-up endometrial cancer, status post
hysterectomy, no adjuvant therapy

EXAM:
CT CHEST, ABDOMEN, AND PELVIS WITH CONTRAST
TECHNIQUE: Multidetector CT imaging of the chest, abdomen and pelvis was
performed following the standard protocol during bolus
administration of intravenous contrast.
CONTRAST:  100mL OMNIPAQUE IOHEXOL 300 MG/ML SOLN, additional oral
enteric contrast

[Series 2: axials cap · axial · 0.81mm/px · z∈[-1464,-934]mm · 11 of 126 slices shown, 13 images]
[im 10/126  soft-tissue]
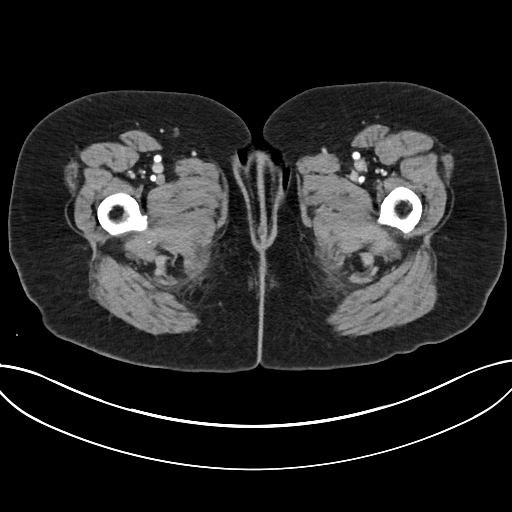
[im 10/126  bone]
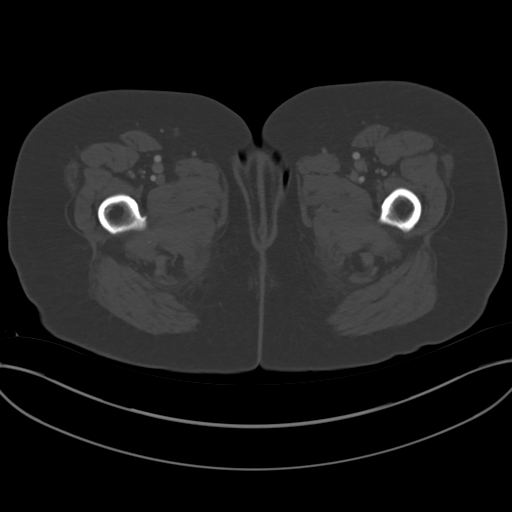
[im 20/126  soft-tissue]
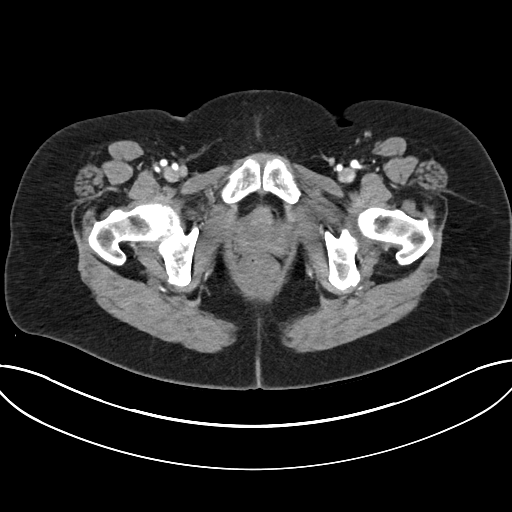
[im 29/126  soft-tissue]
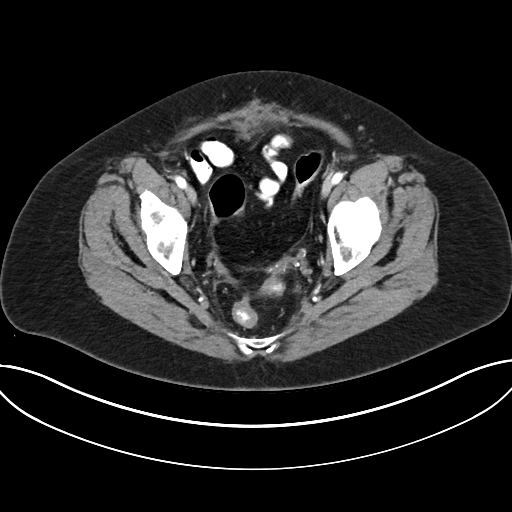
[im 39/126  soft-tissue]
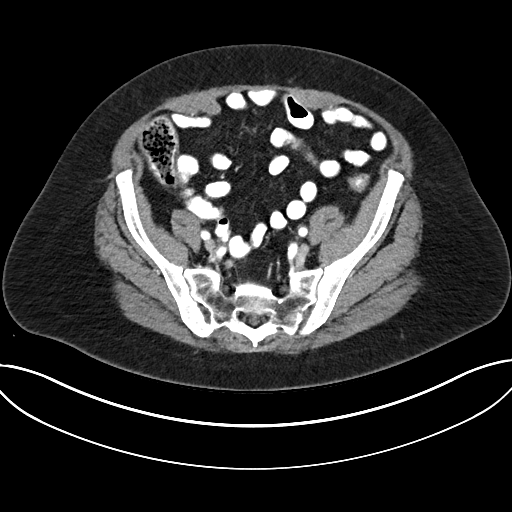
[im 49/126  soft-tissue]
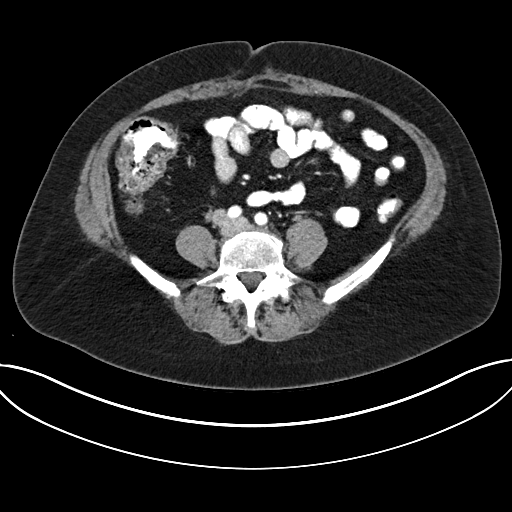
[im 68/126  soft-tissue]
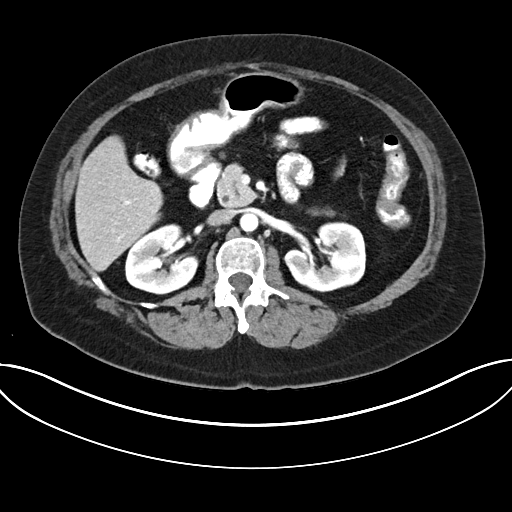
[im 77/126  soft-tissue]
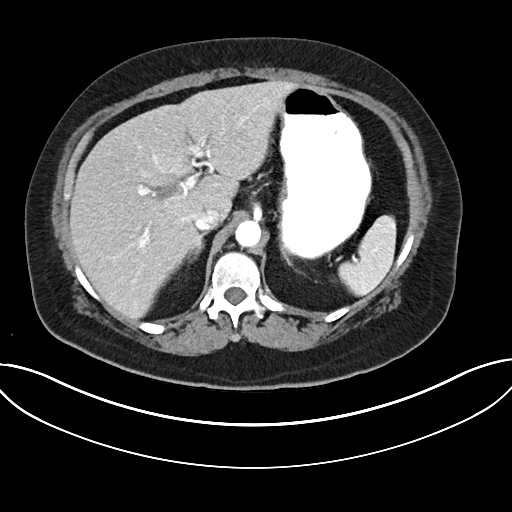
[im 87/126  soft-tissue]
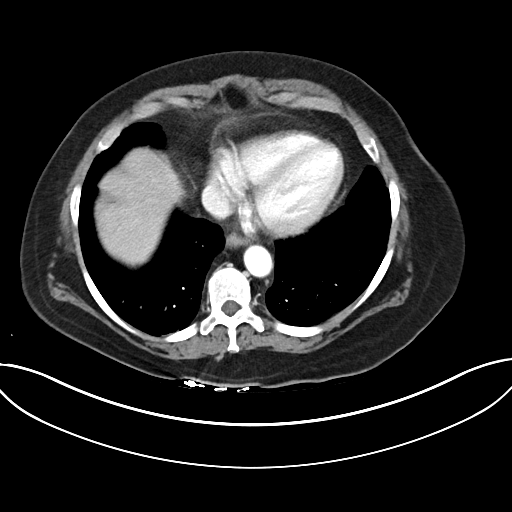
[im 97/126  soft-tissue]
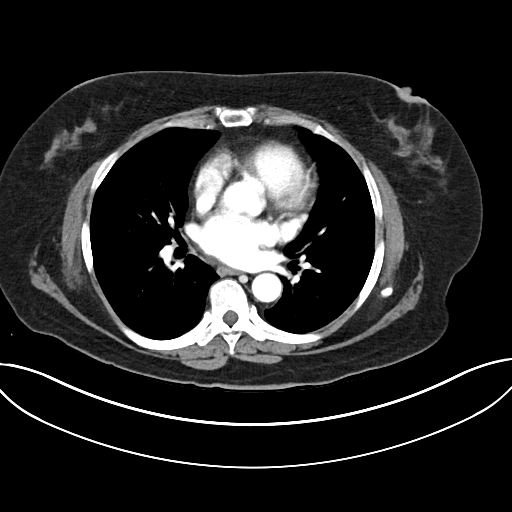
[im 97/126  bone]
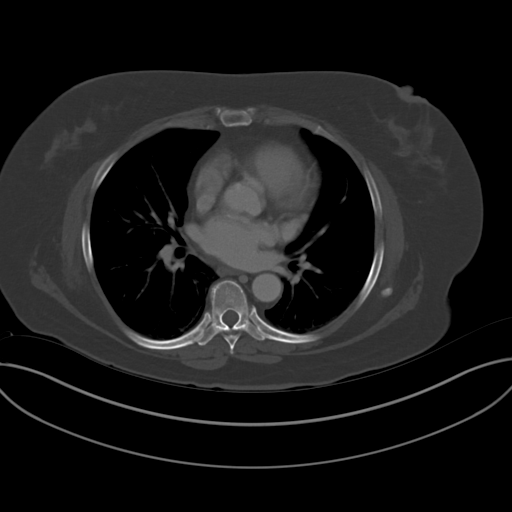
[im 106/126  soft-tissue]
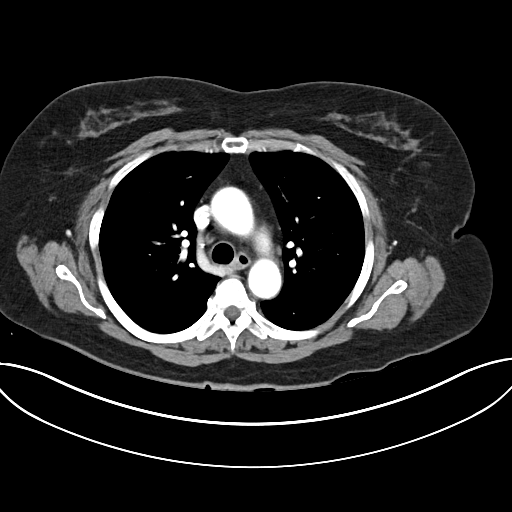
[im 116/126  soft-tissue]
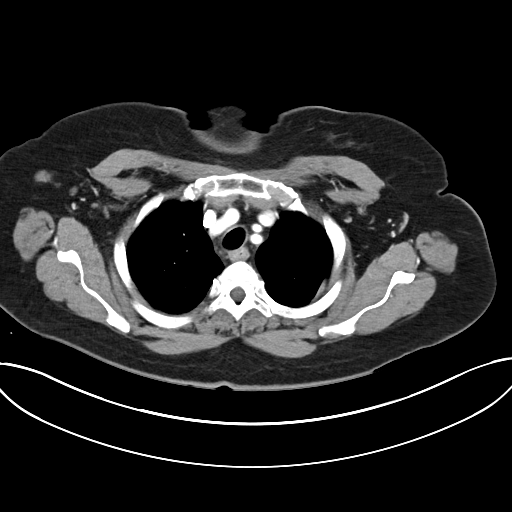

[Series 4: coronals cap · coronal · 0.81mm/px · 3 of 146 slices shown]
[im 49/146  soft-tissue]
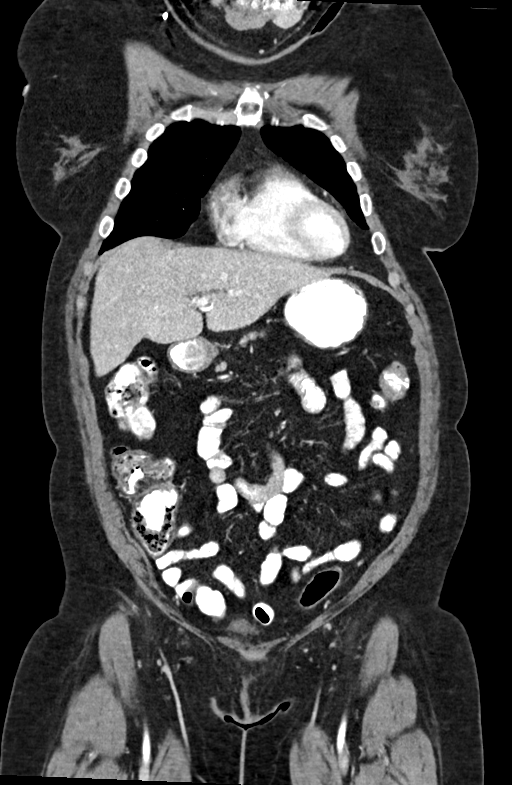
[im 65/146  soft-tissue]
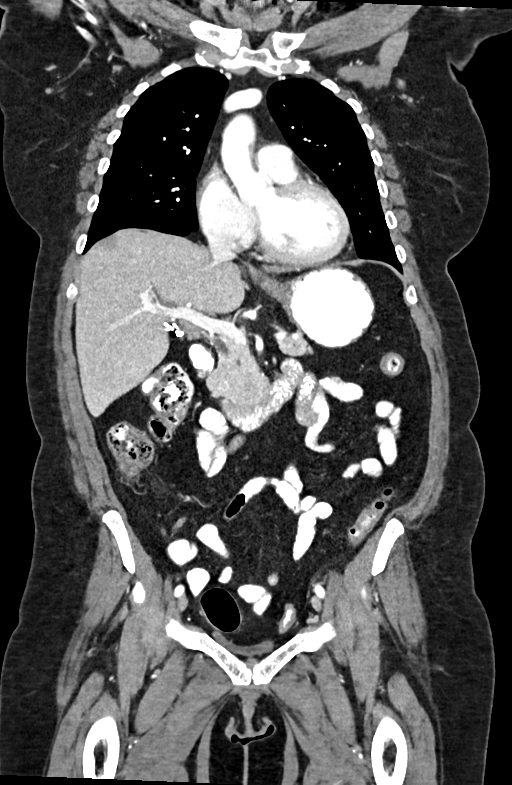
[im 81/146  soft-tissue]
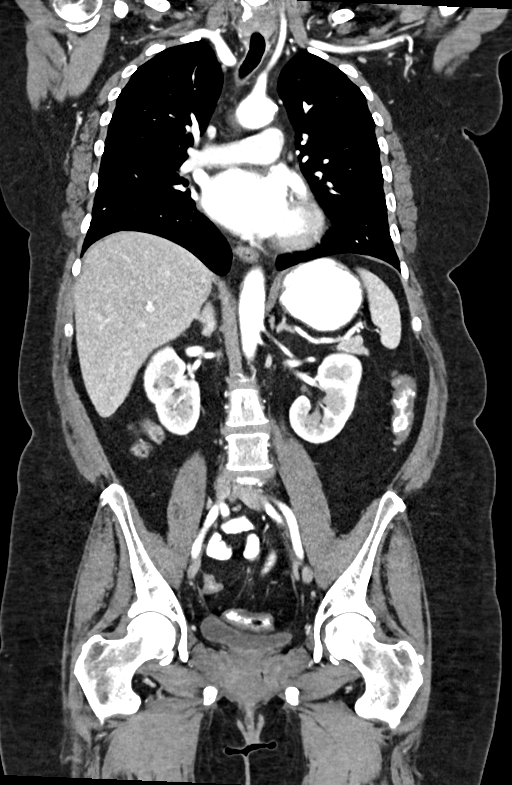

[14 of 46 positions shown; findings below may reference images not displayed]

FINDINGS: CT CHEST FINDINGS

Cardiovascular: Right chest port catheter. Aortic atherosclerosis.
Normal heart size. No pericardial effusion.

Mediastinum/Nodes: No enlarged mediastinal, hilar, or axillary lymph
nodes. Thyroid gland, trachea, and esophagus demonstrate no
significant findings.

Lungs/Pleura: Unchanged 4 mm ground-glass pulmonary nodule of the
left pulmonary apex (series 3, image 24). No pleural effusion or
pneumothorax.

Musculoskeletal: No chest wall mass or suspicious bone lesions
identified.

CT ABDOMEN PELVIS FINDINGS

Hepatobiliary: No focal liver abnormality is seen. Status post
cholecystectomy. No biliary dilatation.

Pancreas: Unremarkable. No pancreatic ductal dilatation or
surrounding inflammatory changes.

Spleen: Normal in size without significant abnormality.

Adrenals/Urinary Tract: Adrenal glands are unremarkable. Kidneys are
normal, without renal calculi, solid lesion, or hydronephrosis.
Bladder is unremarkable.

Stomach/Bowel: Stomach is within normal limits. Appendix appears
normal. No evidence of bowel wall thickening, distention, or
inflammatory changes. Descending colonic diverticula.

Vascular/Lymphatic: Aortic atherosclerosis. There are multiple newly
enlarged retroperitoneal and right iliac lymph nodes, the largest
left retroperitoneal node measuring 1.9 x 1.6 cm (series 2, image
71).

Reproductive: Status post hysterectomy.

Other: No abdominal wall hernia or abnormality. No abdominopelvic
ascites.

Musculoskeletal: No acute or significant osseous findings.
IMPRESSION: 1.  Status post interval hysterectomy.

2. There are multiple newly enlarged retroperitoneal and right iliac
lymph nodes, the largest left retroperitoneal node measuring 1.9 x
1.6 cm (series 2, image 71), concerning for nodal metastatic
disease.

3. Unchanged 4 mm ground-glass pulmonary nodule of the left
pulmonary apex (series 3, image 24). This remains nonspecific
although is again an unlikely manifestation of pulmonary metastatic
disease.

4. Other chronic, incidental, and postoperative findings as above.
Aortic Atherosclerosis ([id number]).

## 2021-05-15 ENCOUNTER — Encounter: Payer: Self-pay | Admitting: Obstetrics and Gynecology

## 2021-05-15 ENCOUNTER — Other Ambulatory Visit (HOSPITAL_COMMUNITY)
Admission: RE | Admit: 2021-05-15 | Discharge: 2021-05-15 | Disposition: A | Discharge status: Home / Self Care | Payer: Medicare Other | Source: Ambulatory Visit | Attending: Obstetrics and Gynecology | Admitting: Obstetrics and Gynecology

## 2021-05-15 ENCOUNTER — Other Ambulatory Visit: Payer: Self-pay

## 2021-05-15 ENCOUNTER — Ambulatory Visit (INDEPENDENT_AMBULATORY_CARE_PROVIDER_SITE_OTHER): Payer: Medicare Other | Admitting: Obstetrics and Gynecology

## 2021-05-15 VITALS — BP 147/79 | HR 82 | Ht 62.0 in | Wt 194.5 lb

## 2021-05-15 DIAGNOSIS — N898 Other specified noninflammatory disorders of vagina: Secondary | ICD-10-CM | POA: Diagnosis present

## 2021-05-15 NOTE — Progress Notes (Signed)
HPI:      Sandra Fischer is a 70 y.o. G2P0020 who LMP was No LMP recorded. Patient is postmenopausal.  Subjective:   She presents today with complaint of vaginal odor and external vaginal itching.  She states that she had this problem in the past and it slightly improved while she was on chemo but now that she is finished she says it has returned.  Her main concern is the odor.    Hx: The following portions of the patient's history were reviewed and updated as appropriate:             She  has a past medical history of Asthma, Asthma, Asthma exacerbation (08/22/2016), Cancer ([place]), Collagen vascular disease ([place]), Diabetes mellitus without complication ([place]), Environmental allergies, Fibromyalgia, High cholesterol, Hypertension, Iron deficiency anemia (07/03/2020), RA (rheumatoid arthritis) ([place]), and Thyroid disease. She does not have any pertinent problems on file. She  has a past surgical history that includes Cholecystectomy; Cataract extraction; Thyroidectomy, partial; fibroids removed; tummy tuck; and PORTA CATH REMOVAL (N/A, 02/14/2021). Her family history includes Dementia in her mother; Diabetes in her mother; Hyperlipidemia in her mother; Hypertension in her mother. She  reports that she has never smoked. She has never used smokeless tobacco. She reports that she does not drink alcohol and does not use drugs. She has a current medication list which includes the following prescription(s): acetaminophen, albuterol, albuterol, ascorbic acid, aspirin ec, azelastine, calcium carbonate, cetirizine, cholecalciferol, clotrimazole, desloratadine, docusate sodium, epinephrine, ferosul, fluticasone, fluticasone-salmeterol, guaifenesin-dextromethorphan, indomethacin, ipratropium, ketotifen, leflunomide, levothyroxine, lidocaine-prilocaine, lisinopril, metoprolol succinate, montelukast, nitroglycerin, olopatadine, omeprazole, pravastatin, multivitamin-prenatal, prochlorperazine, senna-docusate, tiotropium,  tramadol, verapamil, and gabapentin. She is allergic to abatacept, codeine, iodine, latex, shellfish allergy, trimethoprim hydrochloride [trimethoprim], and penicillins.       Review of Systems:  Review of Systems  Constitutional: Denied constitutional symptoms, night sweats, recent illness, fatigue, fever, insomnia and weight loss.  Eyes: Denied eye symptoms, eye pain, photophobia, vision change and visual disturbance.  Ears/Nose/Throat/Neck: Denied ear, nose, throat or neck symptoms, hearing loss, nasal discharge, sinus congestion and sore throat.  Cardiovascular: Denied cardiovascular symptoms, arrhythmia, chest pain/pressure, edema, exercise intolerance, orthopnea and palpitations.  Respiratory: Denied pulmonary symptoms, asthma, pleuritic pain, productive sputum, cough, dyspnea and wheezing.  Gastrointestinal: Denied, gastro-esophageal reflux, melena, nausea and vomiting.  Genitourinary: See HPI for additional information.  Musculoskeletal: Denied musculoskeletal symptoms, stiffness, swelling, muscle weakness and myalgia.  Dermatologic: Denied dermatology symptoms, rash and scar.  Neurologic: Denied neurology symptoms, dizziness, headache, neck pain and syncope.  Psychiatric: Denied psychiatric symptoms, anxiety and depression.  Endocrine: Denied endocrine symptoms including hot flashes and night sweats.   Meds:   Current Outpatient Medications on File Prior to Visit  Medication Sig Dispense Refill  . Acetaminophen (TYLENOL EXTRA STRENGTH PO) Take 1,300 mg by mouth 2 (two) times daily as needed.    Marland Kitchen albuterol (ACCUNEB) 1.25 MG/3ML nebulizer solution Take 1 ampule by nebulization every 6 (six) hours as needed for wheezing.    Marland Kitchen albuterol (PROVENTIL HFA;VENTOLIN HFA) 108 (90 Base) MCG/ACT inhaler Inhale 2 puffs into the lungs every 6 (six) hours as needed for wheezing or shortness of breath. 1 Inhaler 0  . ascorbic acid (VITAMIN C) 500 MG tablet Take 500 mg by mouth daily.    Marland Kitchen aspirin  EC 81 MG tablet Take 81 mg by mouth daily.    Marland Kitchen azelastine (ASTELIN) 0.1 % nasal spray Place 2 sprays into both nostrils 2 (two) times daily. 30 mL 5  . calcium carbonate (  OS-CAL - DOSED IN MG OF ELEMENTAL CALCIUM) 1250 (500 Ca) MG tablet Take 1 tablet by mouth daily with breakfast.     . cetirizine (ZYRTEC) 10 MG tablet Take 10 mg by mouth daily.    . Cholecalciferol (D3 ADULT PO) Take 1 capsule by mouth daily.    . clotrimazole (GYNE-LOTRIMIN) 1 % vaginal cream Place 1 Applicatorful vaginally daily.    Marland Kitchen desloratadine (CLARINEX) 5 MG tablet Take 5 mg by mouth daily.    Marland Kitchen docusate sodium (COLACE) 50 MG capsule Take 50 mg by mouth 2 (two) times daily.    Marland Kitchen EPINEPHrine 0.3 mg/0.3 mL IJ SOAJ injection Use as directed for severe allergic reaction. 2 Device 1  . FEROSUL 325 (65 Fe) MG tablet TAKE ONE TABLET BY MOUTH TWICE DAILY WITH A MEAL 60 tablet 1  . fluticasone (FLONASE) 50 MCG/ACT nasal spray Place 1 spray into both nostrils daily.     . fluticasone-salmeterol (ADVAIR) 250-50 MCG/ACT AEPB Inhale 1 puff into the lungs in the morning and at bedtime.    Marland Kitchen guaiFENesin-dextromethorphan (ROBITUSSIN DM) 100-10 MG/5ML syrup Take 5 mLs by mouth every 4 (four) hours as needed for cough. 118 mL 0  . indomethacin (INDOCIN) 50 MG capsule Take 50 mg by mouth in the morning, at noon, in the evening, and at bedtime.    Marland Kitchen ipratropium (ATROVENT) 0.02 % nebulizer solution Take 0.5 mg by nebulization every 4 (four) hours as needed.     Marland Kitchen ketotifen (ZADITOR) 0.025 % ophthalmic solution Place 1 drop into both eyes 2 (two) times daily as needed.    . leflunomide (ARAVA) 20 MG tablet Take 20 mg by mouth daily.    Marland Kitchen levothyroxine (SYNTHROID, LEVOTHROID) 88 MCG tablet Take 88 mcg by mouth daily before breakfast.    . lidocaine-prilocaine (EMLA) cream Apply to affected area once 30 g 3  . lisinopril (PRINIVIL,ZESTRIL) 40 MG tablet Take 20 mg by mouth in the morning and at bedtime.     . metoprolol succinate (TOPROL-XL)  50 MG 24 hr tablet Take 50 mg by mouth daily. Take with or immediately following a meal.    . montelukast (SINGULAIR) 10 MG tablet Take 10 mg by mouth at bedtime.    . nitroGLYCERIN (NITROSTAT) 0.4 MG SL tablet Place under the tongue. Place 1 tablet (0.4 mg total) under the tongue every 5 (five) minutes as needed for Chest pain May take up to 3 doses.    Marland Kitchen olopatadine (PATANOL) 0.1 % ophthalmic solution Place 1 drop into both eyes 2 (two) times daily. 5 mL 5  . omeprazole (PRILOSEC) 10 MG capsule Take 20 mg by mouth daily.    . pravastatin (PRAVACHOL) 40 MG tablet Take 40 mg by mouth daily.    . Prenatal Vit-Fe Fumarate-FA (MULTIVITAMIN-PRENATAL) 27-0.8 MG TABS tablet Take 1 tablet by mouth daily.     . prochlorperazine (COMPAZINE) 10 MG tablet Take 1 tablet (10 mg total) by mouth every 6 (six) hours as needed (Nausea or vomiting). 30 tablet 1  . senna-docusate (SENOKOT-S) 8.6-50 MG tablet Take 1 tablet by mouth at bedtime as needed for mild constipation.    Marland Kitchen tiotropium (SPIRIVA) 18 MCG inhalation capsule Place 18 mcg into inhaler and inhale daily.    . traMADol (ULTRAM) 50 MG tablet Take 100 mg by mouth as needed (for rheumatoid arthritis).    . verapamil (CALAN) 120 MG tablet Take 240 mg by mouth 2 (two) times daily.    Marland Kitchen gabapentin (NEURONTIN) 300 MG  capsule Take 1 capsule (300 mg total) by mouth 3 (three) times daily. (Patient not taking: Reported on 05/15/2021) 90 capsule 1   No current facility-administered medications on file prior to visit.          Objective:     Vitals:   05/15/21 1416  BP: (!) 147/79  Pulse: 82   Filed Weights   05/15/21 1416  Weight: 194 lb 8 oz (88.2 kg)              Physical examination   Pelvic:  Vulva: Normal appearance.  No lesions.  Vagina: No lesions or abnormalities noted.  Support: Normal pelvic support.  Urethra No masses tenderness or scarring.  Meatus Normal size without lesions or prolapse.     Anus: Normal exam.  No lesions.   Perineum: Normal exam.  No lesions.     Assessment:    G2P0020 Patient Active Problem List   Diagnosis Date Noted  . Diabetes mellitus without complication ([place]) 75/64/3329  . Nuclear sclerotic cataract of right eye 04/03/2021  . Severe persistent asthma 04/03/2021  . Problem related to unspecified psychosocial circumstances 04/03/2021  . Corn of foot 04/03/2021  . Dependence on continuous positive airway pressure ventilation 04/03/2021  . Dermatophytosis of nail 04/03/2021  . Type 2 or unspecified type diabetes mellitus 04/03/2021  . Disorder of bone and cartilage 04/03/2021  . Dysphagia 04/03/2021  . 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  . History of recurrent pneumonia 04/03/2021  . Irritable colon 04/03/2021  . Low back pain 04/03/2021  . Other alveolar and parietoalveolar pneumonopathy 04/03/2021  . Other and unspecified hyperlipidemia 04/03/2021  . Other specified disease of nail 04/03/2021  . Pain in joint, ankle and foot 04/03/2021  . Polyp of colon 04/03/2021  . Seborrheic dermatitis 04/03/2021  . SVT (supraventricular tachycardia) ([place]) 04/03/2021  . Vitiligo 04/03/2021  . Vocal cord dysfunction 04/03/2021  . Obesity 04/03/2021  . Allergic asthma 04/03/2021  . Obstructive sleep apnea 04/03/2021  . Encounter for long-term (current) use of steroids 04/03/2021  . Diabetic neuropathy ([place]) 08/02/2020  . Iron deficiency anemia 07/03/2020  . Vaginal discharge 05/30/2020  . Encounter for antineoplastic chemotherapy 01/30/2020  . 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 Anemia 01/30/2020  . Gastroesophageal reflux disease 12/08/2019  . Microcytic anemia 11/07/2019  . Port-A-Cath in place 11/04/2019  . Rheumatoid arthritis ([place]) 10/29/2019  . Goals of care, counseling/discussion 07/20/2019  . Endometrial cancer ([place]) 07/15/2019  . Acute blood loss anemia 06/22/2019  . Hypothyroidism, adult 06/15/2019  . Myalgia and myositis 06/15/2019  .  Fibromyalgia   . Cardiomyopathy ([place]) 12/06/2018  . Atypical chest pain 12/06/2018  . Perennial and seasonal allergic rhinitis 02/09/2018  . Moderate persistent asthma 02/09/2018  . Allergic conjunctivitis 02/09/2018  . History of food allergy 02/09/2018  . Allergic reaction 02/09/2018  . RA (rheumatoid arthritis) ([place]) 08/22/2016  . HTN (hypertension) 08/22/2016     1. Vaginal odor     And some external itching.   Plan:            1.  Nuswab performed for "everything" at request of the patient.  We will call her with any abnormal results. Orders No orders of the defined types were placed in this encounter.   No orders of the defined types were placed in this encounter.     F/U  Return for We will contact her with any abnormal test results. I spent 22 minutes involved in the care of this patient  preparing to see the patient by obtaining and reviewing her medical history (including labs, imaging tests and prior procedures), documenting clinical information in the electronic health record (EHR), counseling and coordinating care plans, writing and sending prescriptions, ordering tests or procedures and directly communicating with the patient by discussing pertinent items from her history and physical exam as well as detailing my assessment and plan as noted above so that she has an informed understanding.  All of her questions were answered. Finis Bud, M.D. 05/15/2021 2:50 PM

## 2021-05-16 ENCOUNTER — Ambulatory Visit: Payer: Medicare Other | Admitting: Podiatry

## 2021-05-17 LAB — CERVICOVAGINAL ANCILLARY ONLY
Bacterial Vaginitis (gardnerella): NEGATIVE
Candida Glabrata: NEGATIVE
Candida Vaginitis: NEGATIVE
Chlamydia: NEGATIVE
Comment: NEGATIVE
Comment: NEGATIVE
Comment: NEGATIVE
Comment: NEGATIVE
Comment: NEGATIVE
Comment: NORMAL
Neisseria Gonorrhea: NEGATIVE
Trichomonas: NEGATIVE

## 2021-05-22 ENCOUNTER — Telehealth: Payer: Self-pay | Admitting: Obstetrics and Gynecology

## 2021-05-22 NOTE — Telephone Encounter (Signed)
Patient called requesting results, pt stated that she was told results would be back by wed. Please Advise.

## 2021-05-22 NOTE — Telephone Encounter (Signed)
Notified patient that swab was negative.

## 2021-05-23 ENCOUNTER — Other Ambulatory Visit: Payer: Self-pay

## 2021-05-23 ENCOUNTER — Ambulatory Visit (INDEPENDENT_AMBULATORY_CARE_PROVIDER_SITE_OTHER): Payer: Medicare Other | Admitting: Podiatry

## 2021-05-23 DIAGNOSIS — E1142 Type 2 diabetes mellitus with diabetic polyneuropathy: Secondary | ICD-10-CM

## 2021-05-23 DIAGNOSIS — M79675 Pain in left toe(s): Secondary | ICD-10-CM

## 2021-05-23 DIAGNOSIS — M79674 Pain in right toe(s): Secondary | ICD-10-CM

## 2021-05-23 DIAGNOSIS — B351 Tinea unguium: Secondary | ICD-10-CM | POA: Diagnosis not present

## 2021-05-23 NOTE — Progress Notes (Signed)
This patient returns to my office for at risk foot care.  This patient requires this care by a professional since this patient will be at risk due to having diet controlled diabetes.   This patient is unable to cut nails herself since the patient cannot reach her nails.These nails are painful walking and wearing shoes.  This patient presents for at risk foot care today.  General Appearance  Alert, conversant and in no acute stress.  Vascular  Dorsalis pedis and posterior tibial  pulses are palpable  bilaterally.  Capillary return is within normal limits  bilaterally. Temperature is within normal limits  bilaterally.  Neurologic  Senn-Weinstein monofilament wire test diminished   bilaterally. Muscle power within normal limits bilaterally.  Nails Thick disfigured discolored nails with subungual debris  from hallux to fifth toes bilaterally. No evidence of bacterial infection or drainage bilaterally.  Orthopedic  No limitations of motion  feet .  No crepitus or effusions noted.  No bony pathology or digital deformities noted.  HAV  B/L.  Skin  normotropic skin with no porokeratosis noted bilaterally.  No signs of infections or ulcers noted.     Onychomycosis  Pain in right toes  Pain in left toes  Diabetes    Consent was obtained for treatment procedures.   Mechanical debridement of nails 1-5  bilaterally performed with a nail nipper.  Filed with dremel without incident.     Return office visit   3 months                   Told patient to return for periodic foot care and evaluation due to potential at risk complications.   Gardiner Barefoot DPM

## 2021-05-28 ENCOUNTER — Telehealth: Payer: Self-pay | Admitting: Obstetrics and Gynecology

## 2021-05-28 NOTE — Telephone Encounter (Signed)
I have offered patient appointment today at 3. Patient is unable to do this time. I have scheduled patient to come in tomorrow at 2. This was the only time patient could do.

## 2021-05-28 NOTE — Telephone Encounter (Signed)
Patient called this morning and stated she knew her results came back okay but she has a terrible boil and severe itching in her private area.  She states that the cream she is using provides some relief but not for long.  Patient is requesting antibiotics

## 2021-05-29 ENCOUNTER — Encounter: Payer: Medicare Other | Admitting: Obstetrics and Gynecology

## 2021-05-29 ENCOUNTER — Encounter: Payer: Self-pay | Admitting: Obstetrics and Gynecology

## 2021-05-29 ENCOUNTER — Ambulatory Visit (INDEPENDENT_AMBULATORY_CARE_PROVIDER_SITE_OTHER): Payer: Medicare Other | Admitting: Obstetrics and Gynecology

## 2021-05-29 ENCOUNTER — Other Ambulatory Visit: Payer: Self-pay

## 2021-05-29 VITALS — BP 161/87 | HR 80 | Ht 62.0 in | Wt 197.1 lb

## 2021-05-29 DIAGNOSIS — N9089 Other specified noninflammatory disorders of vulva and perineum: Secondary | ICD-10-CM

## 2021-05-29 DIAGNOSIS — L292 Pruritus vulvae: Secondary | ICD-10-CM

## 2021-05-29 MED ORDER — CLOBETASOL PROPIONATE 0.05 % EX OINT: 1.0000 "application " | TOPICAL_OINTMENT | Freq: Two times a day (BID) | CUTANEOUS | 0 refills | Status: AC

## 2021-05-29 NOTE — Progress Notes (Signed)
HPI:      [name] is a 70 y.o. G2P0020 who LMP was No LMP recorded. Patient is postmenopausal.  Subjective:   She presents today stating that despite her negative cultures a few weeks ago she continues to experience severe vulvar itching.  She also states there is a swollen area that she thinks feels like a boil that is draining.  She states that she has tried multiple topical treatments without success.    Hx: The following portions of the patient's history were reviewed and updated as appropriate:             She  has a past medical history of Asthma, Asthma, Asthma exacerbation (08/22/2016), Cancer ([place]), Collagen vascular disease ([place]), Diabetes mellitus without complication ([place]), Environmental allergies, Fibromyalgia, High cholesterol, Hypertension, Iron deficiency anemia (07/03/2020), RA (rheumatoid arthritis) ([place]), and Thyroid disease. She does not have any pertinent problems on file. She  has a past surgical history that includes Cholecystectomy; Cataract extraction; Thyroidectomy, partial; fibroids removed; tummy tuck; and PORTA CATH REMOVAL (N/A, 02/14/2021). Her family history includes Dementia in her mother; Diabetes in her mother; Hyperlipidemia in her mother; Hypertension in her mother. She  reports that she has never smoked. She has never used smokeless tobacco. She reports that she does not drink alcohol and does not use drugs. She has a current medication list which includes the following prescription(s): acetaminophen, albuterol, albuterol, ascorbic acid, aspirin ec, azelastine, calcium carbonate, cetirizine, cholecalciferol, clobetasol ointment, clotrimazole, desloratadine, docusate sodium, epinephrine, ferosul, fluticasone, fluticasone-salmeterol, gabapentin, guaifenesin-dextromethorphan, indomethacin, ipratropium, ketotifen, leflunomide, levothyroxine, lidocaine-prilocaine, lisinopril, metoprolol succinate, montelukast, nitroglycerin, olopatadine, omeprazole, pravastatin,  multivitamin-prenatal, prochlorperazine, senna-docusate, tiotropium, tramadol, and verapamil. She is allergic to abatacept, codeine, iodine, latex, shellfish allergy, trimethoprim hydrochloride [trimethoprim], and penicillins.       Review of Systems:  Review of Systems  Constitutional: Denied constitutional symptoms, night sweats, recent illness, fatigue, fever, insomnia and weight loss.  Eyes: Denied eye symptoms, eye pain, photophobia, vision change and visual disturbance.  Ears/Nose/Throat/Neck: Denied ear, nose, throat or neck symptoms, hearing loss, nasal discharge, sinus congestion and sore throat.  Cardiovascular: Denied cardiovascular symptoms, arrhythmia, chest pain/pressure, edema, exercise intolerance, orthopnea and palpitations.  Respiratory: Denied pulmonary symptoms, asthma, pleuritic pain, productive sputum, cough, dyspnea and wheezing.  Gastrointestinal: Denied, gastro-esophageal reflux, melena, nausea and vomiting.  Genitourinary: See HPI for additional information.  Musculoskeletal: Denied musculoskeletal symptoms, stiffness, swelling, muscle weakness and myalgia.  Dermatologic: Denied dermatology symptoms, rash and scar.  Neurologic: Denied neurology symptoms, dizziness, headache, neck pain and syncope.  Psychiatric: Denied psychiatric symptoms, anxiety and depression.  Endocrine: Denied endocrine symptoms including hot flashes and night sweats.   Meds:   Current Outpatient Medications on File Prior to Visit  Medication Sig Dispense Refill  . Acetaminophen (TYLENOL EXTRA STRENGTH PO) Take 1,300 mg by mouth 2 (two) times daily as needed.    Marland Kitchen albuterol (ACCUNEB) 1.25 MG/3ML nebulizer solution Take 1 ampule by nebulization every 6 (six) hours as needed for wheezing.    Marland Kitchen albuterol (PROVENTIL HFA;VENTOLIN HFA) 108 (90 Base) MCG/ACT inhaler Inhale 2 puffs into the lungs every 6 (six) hours as needed for wheezing or shortness of breath. 1 Inhaler 0  . ascorbic acid (VITAMIN  C) 500 MG tablet Take 500 mg by mouth daily.    Marland Kitchen aspirin EC 81 MG tablet Take 81 mg by mouth daily.    Marland Kitchen azelastine (ASTELIN) 0.1 % nasal spray Place 2 sprays into both nostrils 2 (two) times daily. 30 mL 5  .  calcium carbonate (OS-CAL - DOSED IN MG OF ELEMENTAL CALCIUM) 1250 (500 Ca) MG tablet Take 1 tablet by mouth daily with breakfast.     . cetirizine (ZYRTEC) 10 MG tablet Take 10 mg by mouth daily.    . Cholecalciferol (D3 ADULT PO) Take 1 capsule by mouth daily.    . clotrimazole (GYNE-LOTRIMIN) 1 % vaginal cream Place 1 Applicatorful vaginally daily.    Marland Kitchen desloratadine (CLARINEX) 5 MG tablet Take 5 mg by mouth daily.    Marland Kitchen docusate sodium (COLACE) 50 MG capsule Take 50 mg by mouth 2 (two) times daily.    Marland Kitchen EPINEPHrine 0.3 mg/0.3 mL IJ SOAJ injection Use as directed for severe allergic reaction. 2 Device 1  . FEROSUL 325 (65 Fe) MG tablet TAKE ONE TABLET BY MOUTH TWICE DAILY WITH A MEAL 60 tablet 1  . fluticasone (FLONASE) 50 MCG/ACT nasal spray Place 1 spray into both nostrils daily.     . fluticasone-salmeterol (ADVAIR) 250-50 MCG/ACT AEPB Inhale 1 puff into the lungs in the morning and at bedtime.    . gabapentin (NEURONTIN) 300 MG capsule Take 1 capsule (300 mg total) by mouth 3 (three) times daily. 90 capsule 1  . guaiFENesin-dextromethorphan (ROBITUSSIN DM) 100-10 MG/5ML syrup Take 5 mLs by mouth every 4 (four) hours as needed for cough. 118 mL 0  . indomethacin (INDOCIN) 50 MG capsule Take 50 mg by mouth in the morning, at noon, in the evening, and at bedtime.    Marland Kitchen ipratropium (ATROVENT) 0.02 % nebulizer solution Take 0.5 mg by nebulization every 4 (four) hours as needed.     Marland Kitchen ketotifen (ZADITOR) 0.025 % ophthalmic solution Place 1 drop into both eyes 2 (two) times daily as needed.    . leflunomide (ARAVA) 20 MG tablet Take 20 mg by mouth daily.    Marland Kitchen levothyroxine (SYNTHROID, LEVOTHROID) 88 MCG tablet Take 88 mcg by mouth daily before breakfast.    . lidocaine-prilocaine (EMLA)  cream Apply to affected area once 30 g 3  . lisinopril (PRINIVIL,ZESTRIL) 40 MG tablet Take 20 mg by mouth in the morning and at bedtime.     . metoprolol succinate (TOPROL-XL) 50 MG 24 hr tablet Take 50 mg by mouth daily. Take with or immediately following a meal.    . montelukast (SINGULAIR) 10 MG tablet Take 10 mg by mouth at bedtime.    . nitroGLYCERIN (NITROSTAT) 0.4 MG SL tablet Place under the tongue. Place 1 tablet (0.4 mg total) under the tongue every 5 (five) minutes as needed for Chest pain May take up to 3 doses.    Marland Kitchen olopatadine (PATANOL) 0.1 % ophthalmic solution Place 1 drop into both eyes 2 (two) times daily. 5 mL 5  . omeprazole (PRILOSEC) 10 MG capsule Take 20 mg by mouth daily.    . pravastatin (PRAVACHOL) 40 MG tablet Take 40 mg by mouth daily.    . Prenatal Vit-Fe Fumarate-FA (MULTIVITAMIN-PRENATAL) 27-0.8 MG TABS tablet Take 1 tablet by mouth daily.     . prochlorperazine (COMPAZINE) 10 MG tablet Take 1 tablet (10 mg total) by mouth every 6 (six) hours as needed (Nausea or vomiting). 30 tablet 1  . senna-docusate (SENOKOT-S) 8.6-50 MG tablet Take 1 tablet by mouth at bedtime as needed for mild constipation.    Marland Kitchen tiotropium (SPIRIVA) 18 MCG inhalation capsule Place 18 mcg into inhaler and inhale daily.    . traMADol (ULTRAM) 50 MG tablet Take 100 mg by mouth as needed (for rheumatoid arthritis).    Marland Kitchen  verapamil (CALAN) 120 MG tablet Take 240 mg by mouth 2 (two) times daily.     No current facility-administered medications on file prior to visit.          Objective:     Vitals:   05/29/21 1314  BP: (!) 161/87  Pulse: 80   Filed Weights   05/29/21 1314  Weight: 197 lb 1.6 oz (89.4 kg)              Examination of the labia reveals bilateral excoriation  Right labia shows either a deeper excoriation or a small blister in the area that she complains of a "draining boil".  Assessment:    G2P0020 Patient Active Problem List   Diagnosis Date Noted  . Diabetes  mellitus without complication ([place]) 31/51/7616  . Nuclear sclerotic cataract of right eye 04/03/2021  . Severe persistent asthma 04/03/2021  . Problem related to unspecified psychosocial circumstances 04/03/2021  . Corn of foot 04/03/2021  . Dependence on continuous positive airway pressure ventilation 04/03/2021  . Dermatophytosis of nail 04/03/2021  . Type 2 or unspecified type diabetes mellitus 04/03/2021  . Disorder of bone and cartilage 04/03/2021  . Dysphagia 04/03/2021  . 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  . History of recurrent pneumonia 04/03/2021  . Irritable colon 04/03/2021  . Low back pain 04/03/2021  . Other alveolar and parietoalveolar pneumonopathy 04/03/2021  . Other and unspecified hyperlipidemia 04/03/2021  . Other specified disease of nail 04/03/2021  . Pain in joint, ankle and foot 04/03/2021  . Polyp of colon 04/03/2021  . Seborrheic dermatitis 04/03/2021  . SVT (supraventricular tachycardia) ([place]) 04/03/2021  . Vitiligo 04/03/2021  . Vocal cord dysfunction 04/03/2021  . Obesity 04/03/2021  . Allergic asthma 04/03/2021  . Obstructive sleep apnea 04/03/2021  . Encounter for long-term (current) use of steroids 04/03/2021  . Diabetic neuropathy ([place]) 08/02/2020  . Iron deficiency anemia 07/03/2020  . Vaginal discharge 05/30/2020  . Encounter for antineoplastic chemotherapy 01/30/2020  . 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 Anemia 01/30/2020  . Gastroesophageal reflux disease 12/08/2019  . Microcytic anemia 11/07/2019  . Port-A-Cath in place 11/04/2019  . Rheumatoid arthritis ([place]) 10/29/2019  . Goals of care, counseling/discussion 07/20/2019  . Endometrial cancer ([place]) 07/15/2019  . Acute blood loss anemia 06/22/2019  . Hypothyroidism, adult 06/15/2019  . Myalgia and myositis 06/15/2019  . Fibromyalgia   . Cardiomyopathy ([place]) 12/06/2018  . Atypical chest pain 12/06/2018  . Perennial and seasonal allergic rhinitis 02/09/2018  .  Moderate persistent asthma 02/09/2018  . Allergic conjunctivitis 02/09/2018  . History of food allergy 02/09/2018  . Allergic reaction 02/09/2018  . RA (rheumatoid arthritis) ([place]) 08/22/2016  . HTN (hypertension) 08/22/2016     1. Vulvar itching   2. Vulvar lesion     As her previous cultures have all been negative (Nuswab), I will attempt to treat her chronic itching like lichen sclerosus (patient has other autoimmune conditions). Possible HSV lesion versus excoriation on right labia.   Plan:            1.  HSV cultures performed  2.  We will treat significant bilateral vulvar itching with clobetasol -possible lichen sclerosus. Orders No orders of the defined types were placed in this encounter.    Meds ordered this encounter  Medications  . clobetasol ointment (TEMOVATE) 0.05 %    Sig: Apply 1 application topically 2 (two) times daily.    Dispense:  60 g    Refill:  0  F/U  Return in about 1 month (around 06/28/2021). I spent 23 minutes involved in the care of this patient preparing to see the patient by obtaining and reviewing her medical history (including labs, imaging tests and prior procedures), documenting clinical information in the electronic health record (EHR), counseling and coordinating care plans, writing and sending prescriptions, ordering tests or procedures and directly communicating with the patient by discussing pertinent items from her history and physical exam as well as detailing my assessment and plan as noted above so that she has an informed understanding.  All of her questions were answered.  Finis Bud, M.D. 05/29/2021 1:37 PM

## 2021-05-29 NOTE — Addendum Note (Signed)
Addended by: Durwin Glaze on: 05/29/2021 01:50 PM   Modules accepted: Orders

## 2021-06-01 LAB — HERPES SIMPLEX VIRUS CULTURE

## 2021-06-17 ENCOUNTER — Telehealth: Payer: Self-pay | Admitting: Obstetrics and Gynecology

## 2021-06-17 NOTE — Telephone Encounter (Signed)
Pt called stating that itching has eased up with steroids prescribed, pt now she is having skin come off  when she washes. She denies any pain just itching. Pt requesting call from nurse. Please Advise.

## 2021-06-20 NOTE — Telephone Encounter (Signed)
Spoke to pt concerning her call to the office. Pt stated that the skin was peeling off and but has noticed some improvement since her call to the office.

## 2021-07-08 ENCOUNTER — Inpatient Hospital Stay: Payer: Medicare Other | Attending: Oncology

## 2021-07-08 ENCOUNTER — Other Ambulatory Visit: Payer: Self-pay

## 2021-07-08 DIAGNOSIS — D509 Iron deficiency anemia, unspecified: Secondary | ICD-10-CM | POA: Diagnosis not present

## 2021-07-08 DIAGNOSIS — R911 Solitary pulmonary nodule: Secondary | ICD-10-CM | POA: Diagnosis not present

## 2021-07-08 DIAGNOSIS — M069 Rheumatoid arthritis, unspecified: Secondary | ICD-10-CM | POA: Diagnosis not present

## 2021-07-08 DIAGNOSIS — C541 Malignant neoplasm of endometrium: Secondary | ICD-10-CM | POA: Diagnosis present

## 2021-07-08 LAB — CBC WITH DIFFERENTIAL/PLATELET
Abs Immature Granulocytes: 0.04 10*3/uL (ref 0.00–0.07)
Basophils Absolute: 0 10*3/uL (ref 0.0–0.1)
Basophils Relative: 1 %
Eosinophils Absolute: 0.4 10*3/uL (ref 0.0–0.5)
Eosinophils Relative: 5 %
HCT: 38.3 % (ref 36.0–46.0)
Hemoglobin: 11.7 g/dL — ABNORMAL LOW (ref 12.0–15.0)
Immature Granulocytes: 1 %
Lymphocytes Relative: 36 %
Lymphs Abs: 2.7 10*3/uL (ref 0.7–4.0)
MCH: 26.2 pg (ref 26.0–34.0)
MCHC: 30.5 g/dL (ref 30.0–36.0)
MCV: 85.9 fL (ref 80.0–100.0)
Monocytes Absolute: 0.8 10*3/uL (ref 0.1–1.0)
Monocytes Relative: 11 %
Neutro Abs: 3.6 10*3/uL (ref 1.7–7.7)
Neutrophils Relative %: 46 %
Platelets: 282 10*3/uL (ref 150–400)
RBC: 4.46 MIL/uL (ref 3.87–5.11)
RDW: 16.5 % — ABNORMAL HIGH (ref 11.5–15.5)
WBC: 7.6 10*3/uL (ref 4.0–10.5)
nRBC: 0 % (ref 0.0–0.2)

## 2021-07-08 LAB — COMPREHENSIVE METABOLIC PANEL
ALT: 20 U/L (ref 0–44)
AST: 22 U/L (ref 15–41)
Albumin: 3.6 g/dL (ref 3.5–5.0)
Alkaline Phosphatase: 84 U/L (ref 38–126)
Anion gap: 9 (ref 5–15)
BUN: 16 mg/dL (ref 8–23)
CO2: 25 mmol/L (ref 22–32)
Calcium: 9.1 mg/dL (ref 8.9–10.3)
Chloride: 102 mmol/L (ref 98–111)
Creatinine, Ser: 0.7 mg/dL (ref 0.44–1.00)
GFR, Estimated: >60 mL/min (ref >60)
Glucose, Bld: 111 mg/dL — ABNORMAL HIGH (ref 70–99)
Potassium: 3.9 mmol/L (ref 3.5–5.1)
Sodium: 136 mmol/L (ref 135–145)
Total Bilirubin: 0.4 mg/dL (ref 0.3–1.2)
Total Protein: 8.2 g/dL — ABNORMAL HIGH (ref 6.5–8.1)

## 2021-07-09 ENCOUNTER — Ambulatory Visit (INDEPENDENT_AMBULATORY_CARE_PROVIDER_SITE_OTHER): Payer: Medicare Other | Admitting: Obstetrics and Gynecology

## 2021-07-09 ENCOUNTER — Encounter: Payer: Self-pay | Admitting: Obstetrics and Gynecology

## 2021-07-09 VITALS — BP 164/84 | HR 74 | Ht 62.0 in | Wt 200.0 lb

## 2021-07-09 DIAGNOSIS — L292 Pruritus vulvae: Secondary | ICD-10-CM | POA: Diagnosis not present

## 2021-07-09 LAB — CA 125: Cancer Antigen (CA) 125: 11.4 U/mL (ref 0.0–38.1)

## 2021-07-09 MED ORDER — CLOBETASOL PROPIONATE 0.05 % EX OINT: 1.0000 "application " | TOPICAL_OINTMENT | CUTANEOUS | 1 refills | Status: AC

## 2021-07-09 NOTE — Progress Notes (Signed)
HPI:      [name] is a 70 y.o. G2P0020 who LMP was No LMP recorded. Patient is postmenopausal.  Subjective:   She presents today to follow-up her vulvar itching which has resulted in vulvar excoriation.  She states that she is significantly better after using the steroids.  She has also been trying some "home remedies" and she feels much better.  She says all of her excoriated areas are resolved.  She is understandably happy to have some resolution to her significant vulvar itching and burning. She reports that she has been good about keeping her sugars under control.    Hx: The following portions of the patient's history were reviewed and updated as appropriate:             She  has a past medical history of Asthma, Asthma, Asthma exacerbation (08/22/2016), Cancer ([place]), Collagen vascular disease ([place]), Diabetes mellitus without complication ([place]), Environmental allergies, Fibromyalgia, High cholesterol, Hypertension, Iron deficiency anemia (07/03/2020), RA (rheumatoid arthritis) ([place]), and Thyroid disease. She does not have any pertinent problems on file. She  has a past surgical history that includes Cholecystectomy; Cataract extraction; Thyroidectomy, partial; fibroids removed; tummy tuck; and PORTA CATH REMOVAL (N/A, 02/14/2021). Her family history includes Dementia in her mother; Diabetes in her mother; Hyperlipidemia in her mother; Hypertension in her mother. She  reports that she has never smoked. She has never used smokeless tobacco. She reports that she does not drink alcohol and does not use drugs. She has a current medication list which includes the following prescription(s): acetaminophen, albuterol, albuterol, ascorbic acid, aspirin ec, azelastine, calcium carbonate, cetirizine, cholecalciferol, clotrimazole, desloratadine, docusate sodium, epinephrine, ferosul, fluticasone, fluticasone-salmeterol, gabapentin, guaifenesin-dextromethorphan, indomethacin, ipratropium, ketotifen,  leflunomide, levothyroxine, lidocaine-prilocaine, lisinopril, metoprolol succinate, montelukast, nitroglycerin, olopatadine, omeprazole, pravastatin, multivitamin-prenatal, prochlorperazine, senna-docusate, tiotropium, tramadol, and verapamil. She is allergic to abatacept, codeine, iodine, latex, shellfish allergy, trimethoprim hydrochloride [trimethoprim], and penicillins.       Review of Systems:  Review of Systems  Constitutional: Denied constitutional symptoms, night sweats, recent illness, fatigue, fever, insomnia and weight loss.  Eyes: Denied eye symptoms, eye pain, photophobia, vision change and visual disturbance.  Ears/Nose/Throat/Neck: Denied ear, nose, throat or neck symptoms, hearing loss, nasal discharge, sinus congestion and sore throat.  Cardiovascular: Denied cardiovascular symptoms, arrhythmia, chest pain/pressure, edema, exercise intolerance, orthopnea and palpitations.  Respiratory: Denied pulmonary symptoms, asthma, pleuritic pain, productive sputum, cough, dyspnea and wheezing.  Gastrointestinal: Denied, gastro-esophageal reflux, melena, nausea and vomiting.  Genitourinary: See HPI for additional information.  Musculoskeletal: Denied musculoskeletal symptoms, stiffness, swelling, muscle weakness and myalgia.  Dermatologic: Denied dermatology symptoms, rash and scar.  Neurologic: Denied neurology symptoms, dizziness, headache, neck pain and syncope.  Psychiatric: Denied psychiatric symptoms, anxiety and depression.  Endocrine: Denied endocrine symptoms including hot flashes and night sweats.   Meds:   Current Outpatient Medications on File Prior to Visit  Medication Sig Dispense Refill   Acetaminophen (TYLENOL EXTRA STRENGTH PO) Take 1,300 mg by mouth 2 (two) times daily as needed.     albuterol (ACCUNEB) 1.25 MG/3ML nebulizer solution Take 1 ampule by nebulization every 6 (six) hours as needed for wheezing.     albuterol (PROVENTIL HFA;VENTOLIN HFA) 108 (90 Base)  MCG/ACT inhaler Inhale 2 puffs into the lungs every 6 (six) hours as needed for wheezing or shortness of breath. 1 Inhaler 0   ascorbic acid (VITAMIN C) 500 MG tablet Take 500 mg by mouth daily.     aspirin EC 81 MG tablet Take 81 mg by  mouth daily.     azelastine (ASTELIN) 0.1 % nasal spray Place 2 sprays into both nostrils 2 (two) times daily. 30 mL 5   calcium carbonate (OS-CAL - DOSED IN MG OF ELEMENTAL CALCIUM) 1250 (500 Ca) MG tablet Take 1 tablet by mouth daily with breakfast.      cetirizine (ZYRTEC) 10 MG tablet Take 10 mg by mouth daily.     Cholecalciferol (D3 ADULT PO) Take 1 capsule by mouth daily.     clotrimazole (GYNE-LOTRIMIN) 1 % vaginal cream Place 1 Applicatorful vaginally daily.     desloratadine (CLARINEX) 5 MG tablet Take 5 mg by mouth daily.     docusate sodium (COLACE) 50 MG capsule Take 50 mg by mouth 2 (two) times daily.     EPINEPHrine 0.3 mg/0.3 mL IJ SOAJ injection Use as directed for severe allergic reaction. 2 Device 1   FEROSUL 325 (65 Fe) MG tablet TAKE ONE TABLET BY MOUTH TWICE DAILY WITH A MEAL 60 tablet 1   fluticasone (FLONASE) 50 MCG/ACT nasal spray Place 1 spray into both nostrils daily.      fluticasone-salmeterol (ADVAIR) 250-50 MCG/ACT AEPB Inhale 1 puff into the lungs in the morning and at bedtime.     gabapentin (NEURONTIN) 300 MG capsule Take 1 capsule (300 mg total) by mouth 3 (three) times daily. 90 capsule 1   guaiFENesin-dextromethorphan (ROBITUSSIN DM) 100-10 MG/5ML syrup Take 5 mLs by mouth every 4 (four) hours as needed for cough. 118 mL 0   indomethacin (INDOCIN) 50 MG capsule Take 50 mg by mouth in the morning, at noon, in the evening, and at bedtime.     ipratropium (ATROVENT) 0.02 % nebulizer solution Take 0.5 mg by nebulization every 4 (four) hours as needed.      ketotifen (ZADITOR) 0.025 % ophthalmic solution Place 1 drop into both eyes 2 (two) times daily as needed.     leflunomide (ARAVA) 20 MG tablet Take 20 mg by mouth daily.      levothyroxine (SYNTHROID, LEVOTHROID) 88 MCG tablet Take 88 mcg by mouth daily before breakfast.     lidocaine-prilocaine (EMLA) cream Apply to affected area once 30 g 3   lisinopril (PRINIVIL,ZESTRIL) 40 MG tablet Take 20 mg by mouth in the morning and at bedtime.      metoprolol succinate (TOPROL-XL) 50 MG 24 hr tablet Take 50 mg by mouth daily. Take with or immediately following a meal.     montelukast (SINGULAIR) 10 MG tablet Take 10 mg by mouth at bedtime.     nitroGLYCERIN (NITROSTAT) 0.4 MG SL tablet Place under the tongue. Place 1 tablet (0.4 mg total) under the tongue every 5 (five) minutes as needed for Chest pain May take up to 3 doses.     olopatadine (PATANOL) 0.1 % ophthalmic solution Place 1 drop into both eyes 2 (two) times daily. 5 mL 5   omeprazole (PRILOSEC) 10 MG capsule Take 20 mg by mouth daily.     pravastatin (PRAVACHOL) 40 MG tablet Take 40 mg by mouth daily.     Prenatal Vit-Fe Fumarate-FA (MULTIVITAMIN-PRENATAL) 27-0.8 MG TABS tablet Take 1 tablet by mouth daily.      prochlorperazine (COMPAZINE) 10 MG tablet Take 1 tablet (10 mg total) by mouth every 6 (six) hours as needed (Nausea or vomiting). 30 tablet 1   senna-docusate (SENOKOT-S) 8.6-50 MG tablet Take 1 tablet by mouth at bedtime as needed for mild constipation.     tiotropium (SPIRIVA) 18 MCG inhalation capsule Place 18  mcg into inhaler and inhale daily.     traMADol (ULTRAM) 50 MG tablet Take 100 mg by mouth as needed (for rheumatoid arthritis).     verapamil (CALAN) 120 MG tablet Take 240 mg by mouth 2 (two) times daily.     No current facility-administered medications on file prior to visit.          Objective:     Vitals:   07/09/21 1411  BP: (!) 164/84  Pulse: 74   Filed Weights   07/09/21 1411  Weight: 200 lb (90.7 kg)                Assessment:    G2P0020 Patient Active Problem List   Diagnosis Date Noted   Diabetes mellitus without complication ([place]) 93/26/7124   Nuclear sclerotic  cataract of right eye 04/03/2021   Severe persistent asthma 04/03/2021   Problem related to unspecified psychosocial circumstances 04/03/2021   Corn of foot 04/03/2021   Dependence on continuous positive airway pressure ventilation 04/03/2021   Dermatophytosis of nail 04/03/2021   Type 2 or unspecified type diabetes mellitus 04/03/2021   Disorder of bone and cartilage 04/03/2021   Dysphagia 04/03/2021   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   History of recurrent pneumonia 04/03/2021   Irritable colon 04/03/2021   Low back pain 04/03/2021   Other alveolar and parietoalveolar pneumonopathy 04/03/2021   Other and unspecified hyperlipidemia 04/03/2021   Other specified disease of nail 04/03/2021   Pain in joint, ankle and foot 04/03/2021   Polyp of colon 04/03/2021   Seborrheic dermatitis 04/03/2021   SVT (supraventricular tachycardia) ([place]) 04/03/2021   Vitiligo 04/03/2021   Vocal cord dysfunction 04/03/2021   Obesity 04/03/2021   Allergic asthma 04/03/2021   Obstructive sleep apnea 04/03/2021   Encounter for long-term (current) use of steroids 04/03/2021   Diabetic neuropathy ([place]) 08/02/2020   Iron deficiency anemia 07/03/2020   Vaginal discharge 05/30/2020   Encounter for antineoplastic chemotherapy 01/30/2020   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   Anemia 01/30/2020   Gastroesophageal reflux disease 12/08/2019   Microcytic anemia 11/07/2019   Port-A-Cath in place 11/04/2019   Rheumatoid arthritis ([place]) 10/29/2019   Goals of care, counseling/discussion 07/20/2019   Endometrial cancer ([place]) 07/15/2019   Acute blood loss anemia 06/22/2019   Hypothyroidism, adult 06/15/2019   Myalgia and myositis 06/15/2019   Fibromyalgia    Cardiomyopathy ([place]) 12/06/2018   Atypical chest pain 12/06/2018   Perennial and seasonal allergic rhinitis 02/09/2018   Moderate persistent asthma 02/09/2018   Allergic conjunctivitis 02/09/2018   History of food allergy 02/09/2018    Allergic reaction 02/09/2018   RA (rheumatoid arthritis) ([place]) 08/22/2016   HTN (hypertension) 08/22/2016     1. Vulvar itching     Significant improvement in her vulvar itching   Plan:            1.  She would like to continue the clobetasol because it is one of the only things that has worked for her.  2.  Follow-up for annual examination. Orders No orders of the defined types were placed in this encounter.   No orders of the defined types were placed in this encounter.     F/U  Return for Annual Physical. I spent 22 minutes involved in the care of this patient preparing to see the patient by obtaining and reviewing her medical history (including labs, imaging tests and prior procedures), documenting clinical information in the electronic health record (EHR), counseling and coordinating care plans,  writing and sending prescriptions, ordering tests or procedures and directly communicating with the patient by discussing pertinent items from her history and physical exam as well as detailing my assessment and plan as noted above so that she has an informed understanding.  All of her questions were answered.  Finis Bud, M.D. 07/09/2021 2:38 PM

## 2021-07-10 ENCOUNTER — Inpatient Hospital Stay (HOSPITAL_BASED_OUTPATIENT_CLINIC_OR_DEPARTMENT_OTHER): Payer: Medicare Other | Admitting: Oncology

## 2021-07-10 ENCOUNTER — Encounter: Payer: Self-pay | Admitting: Oncology

## 2021-07-10 VITALS — BP 166/77 | HR 70 | Temp 98.4°F | Resp 18 | Wt 198.0 lb

## 2021-07-10 DIAGNOSIS — D649 Anemia, unspecified: Secondary | ICD-10-CM

## 2021-07-10 DIAGNOSIS — C541 Malignant neoplasm of endometrium: Secondary | ICD-10-CM | POA: Diagnosis not present

## 2021-07-10 DIAGNOSIS — R911 Solitary pulmonary nodule: Secondary | ICD-10-CM | POA: Diagnosis not present

## 2021-07-10 DIAGNOSIS — M069 Rheumatoid arthritis, unspecified: Secondary | ICD-10-CM

## 2021-07-10 NOTE — Progress Notes (Signed)
Patient here for oncology follow-up appointment, expresses concerns of asthma flareup

## 2021-07-10 NOTE — Progress Notes (Signed)
Hematology/Oncology follow up note [facility] [phone] Fax:([phone]) [phone]   Patient Care Team: Felipa Eth, MD as PCP - General (Internal Medicine) Clent Jacks, RN as Oncology Nurse Navigator Noreene Filbert, MD as Radiation Oncologist (Radiation Oncology)  REFERRING PROVIDER: Felipa Eth, MD  CHIEF COMPLAINTS/REASON FOR VISIT:  Follow up for endometrial cancer HISTORY OF PRESENTING ILLNESS:   Sandra Fischer is a  70 y.o.  female with PMH listed below was seen in consultation at the request of  Felipa Eth, MD  for evaluation of endometrial cancer Patient had TH/BSO sentinel lymph node biopsy by [facility] GYN oncology Dr. Allen Norris on 06/21/2019. Extensive medical records from [place] system review was performed by me.  Tumor size 9.3 cm, invading 99% of myometrium [3.55 out of 3.6 cm], positive right external iliac sentinel lymph nodes.  Negative washing.  No LVI, serosa is uninvolved. 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 [id number]  FIGO stage IIIC1 MSI intact HER 2 negative.   Patient was seen by Dr. BFransisca Connorson 07/06/2019 due to vaginal odor. Patient presents for establishing care and discussion for adjuvant chemotherapy radiation.  Per Dr. BBlake Divinenote, [facility] on 07/04/2019.  Portex 3 regimen was recommended given subgroup analysis showing benefit in the serous histology versus clinical trial. HER2 was ordered and pending results.   Patient has a history of SVT.  10/14/2018 2D echo reviewed moderately reduced LV function with LVEF 35 to 40% with diffuse hypokinesis.  Patient had Lexiscan Myoview on 01/19/2019.  LVEF 49%.  SPECT images revealed a mild fixed inferior wall defect, scar versus artifact.  No evidence of ischemia.  She follows up with cardiology Dr.Paraschos 04/26/2019 with a plan to stay on her current medication. 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 She is married and providing care for her mother.  # 10/20/2019 CT chest abdomen pelvis with contrast showed disease recurrence.  There are multiple newly enlarged retroperitoneal and right iliac lymph nodes the largest the left retroperitoneal node measuring 1.9 x 1.6 cm concerning for nodal metastasis disease.  Unchanged 4 mm groundglass pulmonary nodule of the left pulmonary apex.  This remains nonspecific. #Cardiomyopathy, LVEF35 to 40% Last seen by cardiology 04/26/2019.  Recommend patient to continue follow-up with cardiology.  # GERD  ER visit on 11/13/2019 due to chest pain which started after he ate at a restaurant and developed burning sensation in her chest.  No associated dyspnea. Patient had a CTA which showed no pulmonary embolism.  Patient symptoms got better after GI cocktail.  Was considered to be secondary to acid reflux.  Patient takes omeprazole 20 mg daily.   # 11/03/2019 -02/20/2020 S/p 6 cycles of chemotherapy with carboplatin and Taxol. Postchemotherapy CT scan showed excellent treatment response.   I discussed with Dr.Secord, according to data from GOG 258,  there were no differences in relapse-free survival fewer lower vaginal recurrences and fewer lower pelvic and para-aortic relapses with the addition of RT    INTERVAL HISTORY Sandra Fischer a 70y.o. female who has above history reviewed by me today presents for follow up visit for management of endometrial cancer. She reports feeling well today. Chronic mild neuropathy symptoms. Patient was seen by gynecology oncology in April 2022 and she will return to GYN oncology with 636-month Review of Systems  Constitutional:  Positive for fatigue. Negative for appetite change, chills and fever.  HENT:   Negative  for hearing loss and voice change.   Eyes:  Negative for eye problems.  Respiratory:  Negative for chest tightness and cough.   Cardiovascular:  Negative for chest pain.   Gastrointestinal:  Negative for abdominal distention, abdominal pain and blood in stool.  Endocrine: Negative for hot flashes.  Genitourinary:  Negative for difficulty urinating and frequency.   Musculoskeletal:  Negative for arthralgias.  Skin:  Negative for itching and rash.  Neurological:  Positive for numbness. Negative for extremity weakness.  Hematological:  Negative for adenopathy.  Psychiatric/Behavioral:  Negative for confusion.    MEDICAL HISTORY:  Past Medical History:  Diagnosis Date   Asthma    Asthma    Asthma exacerbation 08/22/2016   Cancer ([place])    Collagen vascular disease ([place])    Diabetes mellitus without complication ([place])    History of Diabetes    Environmental allergies    Fibromyalgia    High cholesterol    Hypertension    Iron deficiency anemia 07/03/2020   RA (rheumatoid arthritis) ([place])    Thyroid disease     SURGICAL HISTORY: Past Surgical History:  Procedure Laterality Date   CATARACT EXTRACTION     CHOLECYSTECTOMY     fibroids removed     PORTA CATH REMOVAL N/A 02/14/2021   Procedure: PORTA CATH REMOVAL;  Surgeon: Algernon Huxley, MD;  Location: [facility];  Service: Cardiovascular;  Laterality: N/A;   THYROIDECTOMY, PARTIAL     tummy tuck      SOCIAL HISTORY: Social History   Socioeconomic History   Marital status: Married    Spouse name: Cedric    Number of children: Not on file   Years of education: Not on file   Highest education level: Not on file  Occupational History   Occupation: Retired  Tobacco Use   Smoking status: Former    Packs/day: 0.10    Years: 0.25    Pack years: 0.03    Types: Cigarettes    Quit date: 1980    Years since quitting: 42.5   Smokeless tobacco: Never  Vaping Use   Vaping Use: Never used  Substance and Sexual Activity   Alcohol use: No   Drug use: No   Sexual activity: Yes    Birth control/protection: Post-menopausal  Other Topics Concern   Not on file  Social History Narrative   Not  on file   Social Determinants of Health   Financial Resource Strain: Not on file  Food Insecurity: Not on file  Transportation Needs: Not on file  Physical Activity: Not on file  Stress: Not on file  Social Connections: Not on file  Intimate Partner Violence: Not on file    FAMILY HISTORY: Family History  Problem Relation Age of Onset   Diabetes Mother    Hypertension Mother    Hyperlipidemia Mother    Dementia Mother    Breast cancer Neg Hx    Ovarian cancer Neg Hx    Colon cancer Neg Hx     ALLERGIES:  is allergic to abatacept, codeine, iodine, latex, shellfish allergy, trimethoprim hydrochloride [trimethoprim], and penicillins.  MEDICATIONS:  Current Outpatient Medications  Medication Sig Dispense Refill   Acetaminophen (TYLENOL EXTRA STRENGTH PO) Take 1,300 mg by mouth 2 (two) times daily as needed.     albuterol (ACCUNEB) 1.25 MG/3ML nebulizer solution Take 1 ampule by nebulization every 6 (six) hours as needed for wheezing.     albuterol (PROVENTIL HFA;VENTOLIN HFA) 108 (90 Base) MCG/ACT inhaler Inhale  2 puffs into the lungs every 6 (six) hours as needed for wheezing or shortness of breath. 1 Inhaler 0   ascorbic acid (VITAMIN C) 500 MG tablet Take 500 mg by mouth daily.     aspirin EC 81 MG tablet Take 81 mg by mouth daily.     azelastine (ASTELIN) 0.1 % nasal spray Place 2 sprays into both nostrils 2 (two) times daily. 30 mL 5   calcium carbonate (OS-CAL - DOSED IN MG OF ELEMENTAL CALCIUM) 1250 (500 Ca) MG tablet Take 1 tablet by mouth daily with breakfast.      cetirizine (ZYRTEC) 10 MG tablet Take 10 mg by mouth daily.     Cholecalciferol (D3 ADULT PO) Take 1 capsule by mouth daily.     clobetasol ointment (TEMOVATE) 9.93 % Apply 1 application topically 3 (three) times a week. 60 g 1   clotrimazole (GYNE-LOTRIMIN) 1 % vaginal cream Place 1 Applicatorful vaginally daily.     desloratadine (CLARINEX) 5 MG tablet Take 5 mg by mouth daily.     docusate sodium (COLACE)  50 MG capsule Take 50 mg by mouth 2 (two) times daily.     EPINEPHrine 0.3 mg/0.3 mL IJ SOAJ injection Use as directed for severe allergic reaction. 2 Device 1   FEROSUL 325 (65 Fe) MG tablet TAKE ONE TABLET BY MOUTH TWICE DAILY WITH A MEAL 60 tablet 1   fluticasone (FLONASE) 50 MCG/ACT nasal spray Place 1 spray into both nostrils daily.      fluticasone-salmeterol (ADVAIR) 250-50 MCG/ACT AEPB Inhale 1 puff into the lungs in the morning and at bedtime.     gabapentin (NEURONTIN) 300 MG capsule Take 1 capsule (300 mg total) by mouth 3 (three) times daily. 90 capsule 1   guaiFENesin-dextromethorphan (ROBITUSSIN DM) 100-10 MG/5ML syrup Take 5 mLs by mouth every 4 (four) hours as needed for cough. 118 mL 0   indomethacin (INDOCIN) 50 MG capsule Take 50 mg by mouth in the morning, at noon, in the evening, and at bedtime.     ipratropium (ATROVENT) 0.02 % nebulizer solution Take 0.5 mg by nebulization every 4 (four) hours as needed.      ketotifen (ZADITOR) 0.025 % ophthalmic solution Place 1 drop into both eyes 2 (two) times daily as needed.     leflunomide (ARAVA) 20 MG tablet Take 20 mg by mouth daily.     levothyroxine (SYNTHROID, LEVOTHROID) 88 MCG tablet Take 88 mcg by mouth daily before breakfast.     lidocaine-prilocaine (EMLA) cream Apply to affected area once 30 g 3   lisinopril (PRINIVIL,ZESTRIL) 40 MG tablet Take 20 mg by mouth in the morning and at bedtime.      metoprolol succinate (TOPROL-XL) 50 MG 24 hr tablet Take 50 mg by mouth daily. Take with or immediately following a meal.     montelukast (SINGULAIR) 10 MG tablet Take 10 mg by mouth at bedtime.     nitroGLYCERIN (NITROSTAT) 0.4 MG SL tablet Place under the tongue. Place 1 tablet (0.4 mg total) under the tongue every 5 (five) minutes as needed for Chest pain May take up to 3 doses.     olopatadine (PATANOL) 0.1 % ophthalmic solution Place 1 drop into both eyes 2 (two) times daily. 5 mL 5   omeprazole (PRILOSEC) 10 MG capsule Take 20  mg by mouth daily.     pravastatin (PRAVACHOL) 40 MG tablet Take 40 mg by mouth daily.     Prenatal Vit-Fe Fumarate-FA (MULTIVITAMIN-PRENATAL) 27-0.8 MG TABS  tablet Take 1 tablet by mouth daily.      prochlorperazine (COMPAZINE) 10 MG tablet Take 1 tablet (10 mg total) by mouth every 6 (six) hours as needed (Nausea or vomiting). 30 tablet 1   senna-docusate (SENOKOT-S) 8.6-50 MG tablet Take 1 tablet by mouth at bedtime as needed for mild constipation.     tiotropium (SPIRIVA) 18 MCG inhalation capsule Place 18 mcg into inhaler and inhale daily.     traMADol (ULTRAM) 50 MG tablet Take 100 mg by mouth as needed (for rheumatoid arthritis).     verapamil (CALAN) 120 MG tablet Take 240 mg by mouth 2 (two) times daily.     No current facility-administered medications for this visit.     PHYSICAL EXAMINATION: ECOG PERFORMANCE STATUS: 1 - Symptomatic but completely ambulatory  Physical Exam Constitutional:      General: She is not in acute distress.    Appearance: She is obese.  HENT:     Head: Normocephalic and atraumatic.  Eyes:     General: No scleral icterus. Cardiovascular:     Rate and Rhythm: Normal rate and regular rhythm.     Heart sounds: Normal heart sounds.  Pulmonary:     Effort: Pulmonary effort is normal. No respiratory distress.     Breath sounds: No wheezing.  Abdominal:     General: Bowel sounds are normal. There is no distension.     Palpations: Abdomen is soft.  Musculoskeletal:        General: No deformity. Normal range of motion.     Cervical back: Normal range of motion and neck supple.  Skin:    General: Skin is warm and dry.     Findings: No erythema or rash.  Neurological:     Mental Status: She is alert and oriented to person, place, and time. Mental status is at baseline.     Cranial Nerves: No cranial nerve deficit.     Coordination: Coordination normal.  Psychiatric:        Mood and Affect: Mood normal.    LABORATORY DATA:  I have reviewed the  data as listed Lab Results  Component Value Date   WBC 7.6 07/08/2021   HGB 11.7 (L) 07/08/2021   HCT 38.3 07/08/2021   MCV 85.9 07/08/2021   PLT 282 07/08/2021   Recent Labs    09/10/20 1333 01/02/21 1331 07/08/21 1314  NA 136 136 136  K 4.0 3.7 3.9  CL 102 103 102  CO2 _0 GLUCOSE 93 121* 111*  BUN _1 CREATININE 0.66 0.96 0.70  CALCIUM 8.7* 9.3 9.1  GFRNONAA >60 >60 >60  GFRAA >60  --   --   PROT 7.8 7.8 8.2*  ALBUMIN 3.5 3.7 3.6  AST _2 ALT _3 ALKPHOS 75 101 84  BILITOT 0.5 0.5 0.4    Iron/TIBC/Ferritin/ %Sat    Component Value Date/Time   IRON 43 01/02/2021 1331   TIBC 200 (L) 01/02/2021 1331   FERRITIN 759 (H) 01/02/2021 1331   IRONPCTSAT 22 01/02/2021 1331      RADIOGRAPHIC STUDIES: I have personally reviewed the radiological images as listed and agreed with the findings in the report., No results found.    ASSESSMENT & PLAN:  1. Endometrial cancer ([place])   2. Rheumatoid arthritis, involving unspecified site, unspecified whether rheumatoid factor present (HCC)   3. Lung nodule   4. Anemia, unspecified type    #Recurrent endometrial cancer Status  post 6 cycles of chemotherapy with carboplatin and Taxol. Post chemotherapy CT scan showed excellent response and resolution of lymphadenopathy. Labs are reviewed and discussed with patient. Counts are stable.  Labs reviewed and discussed with patient Continue alternate follow-up with GYN oncology and [place] every 6 months.   #Iron deficiency anemia, status post IV Venofer treatments. Iron has improved and the ferritin is currently elevated.  Likely reactive due to chronic inflammation.  I will check hemochromatosis screening -pending  No need for additional IV Venofer treatment at this point. Hemoglobin is stable at 11.7  #Rheumatoid arthritis, follow-up rheumatologist.    #pulmonary nodule, stable lung nodule on CT chest in March 2022  I will hold off additional CT at this  point.  Return of visit: 6 months. Earlie Server, MD, PhD Hematology Oncology [facility] at [facility] Pager- [phone] 07/10/2021

## 2021-07-12 LAB — HEMOCHROMATOSIS DNA-PCR(C282Y,H63D)

## 2021-07-18 ENCOUNTER — Telehealth: Payer: Self-pay | Admitting: *Deleted

## 2021-07-18 NOTE — Telephone Encounter (Signed)
Patient called to request a CT. I reviewed Dr. Collie Siad last note from 07/10/2021 with her which states she is not going to order a CT for now. Patient verbalized understanding.

## 2021-07-18 NOTE — Telephone Encounter (Signed)
See previous note

## 2021-08-01 ENCOUNTER — Ambulatory Visit (INDEPENDENT_AMBULATORY_CARE_PROVIDER_SITE_OTHER): Payer: Medicare Other | Admitting: Podiatry

## 2021-08-01 ENCOUNTER — Encounter: Payer: Self-pay | Admitting: Podiatry

## 2021-08-01 ENCOUNTER — Other Ambulatory Visit: Payer: Self-pay

## 2021-08-01 DIAGNOSIS — M79674 Pain in right toe(s): Secondary | ICD-10-CM | POA: Diagnosis not present

## 2021-08-01 DIAGNOSIS — B351 Tinea unguium: Secondary | ICD-10-CM

## 2021-08-01 DIAGNOSIS — M79675 Pain in left toe(s): Secondary | ICD-10-CM

## 2021-08-01 DIAGNOSIS — E1142 Type 2 diabetes mellitus with diabetic polyneuropathy: Secondary | ICD-10-CM

## 2021-08-01 DIAGNOSIS — G620 Drug-induced polyneuropathy: Secondary | ICD-10-CM

## 2021-08-01 NOTE — Progress Notes (Signed)
This patient returns to my office for at risk foot care.  This patient requires this care by a professional since this patient will be at risk due to having diet controlled diabetes.   This patient is unable to cut nails herself since the patient cannot reach her nails.These nails are painful walking and wearing shoes.  This patient presents for at risk foot care today.  General Appearance  Alert, conversant and in no acute stress.  Vascular  Dorsalis pedis and posterior tibial  pulses are palpable  bilaterally.  Capillary return is within normal limits  bilaterally. Temperature is within normal limits  bilaterally.  Neurologic  Senn-Weinstein monofilament wire test WNL  bilaterally. Muscle power within normal limits bilaterally.  Nails Thick disfigured discolored nails with subungual debris  from hallux to fifth toes bilaterally. No evidence of bacterial infection or drainage bilaterally.  Orthopedic  No limitations of motion  feet .  No crepitus or effusions noted.  No bony pathology or digital deformities noted.  HAV  B/L.  Skin  normotropic skin with no porokeratosis noted bilaterally.  No signs of infections or ulcers noted.     Onychomycosis  Pain in right toes  Pain in left toes  Diabetes    Consent was obtained for treatment procedures.   Mechanical debridement of nails 1-5  bilaterally performed with a nail nipper.  Filed with dremel without incident.     Return office visit   10 weeks                  Told patient to return for periodic foot care and evaluation due to potential at risk complications.   Khaled Herda DPM  

## 2021-08-02 ENCOUNTER — Telehealth: Payer: Self-pay | Admitting: *Deleted

## 2021-08-02 NOTE — Telephone Encounter (Signed)
Call returned to patient and advised ofNP response. She stated that she knows not to take orally, and that they are a vaginal suppository for the odor she has been having

## 2021-08-02 NOTE — Telephone Encounter (Signed)
Patient called reporting that she ordered some vaginal suppositories call [facility] [employer] which is borax based and is asking if it would be alright to use them since she has had a hysterectomy. Please advise.  [facility] ~ Vaginal pH Support Suppositories ~ 6 Suppository Tablets

## 2021-10-09 ENCOUNTER — Ambulatory Visit: Payer: Medicare Other

## 2021-10-15 ENCOUNTER — Encounter: Payer: Medicare Other | Admitting: Obstetrics and Gynecology

## 2021-10-17 ENCOUNTER — Encounter: Payer: Self-pay | Admitting: Podiatry

## 2021-10-17 ENCOUNTER — Other Ambulatory Visit: Payer: Self-pay

## 2021-10-17 ENCOUNTER — Ambulatory Visit (INDEPENDENT_AMBULATORY_CARE_PROVIDER_SITE_OTHER): Payer: Medicare Other | Admitting: Podiatry

## 2021-10-17 DIAGNOSIS — M79674 Pain in right toe(s): Secondary | ICD-10-CM

## 2021-10-17 DIAGNOSIS — B351 Tinea unguium: Secondary | ICD-10-CM

## 2021-10-17 DIAGNOSIS — E1142 Type 2 diabetes mellitus with diabetic polyneuropathy: Secondary | ICD-10-CM | POA: Diagnosis not present

## 2021-10-17 DIAGNOSIS — M79675 Pain in left toe(s): Secondary | ICD-10-CM

## 2021-10-17 NOTE — Progress Notes (Signed)
This patient returns to my office for at risk foot care.  This patient requires this care by a professional since this patient will be at risk due to having diet controlled diabetes.   This patient is unable to cut nails herself since the patient cannot reach her nails.These nails are painful walking and wearing shoes.  This patient presents for at risk foot care today.  General Appearance  Alert, conversant and in no acute stress.  Vascular  Dorsalis pedis and posterior tibial  pulses are palpable  bilaterally.  Capillary return is within normal limits  bilaterally. Temperature is within normal limits  bilaterally.  Neurologic  Senn-Weinstein monofilament wire test WNL  bilaterally. Muscle power within normal limits bilaterally.  Nails Thick disfigured discolored nails with subungual debris  from hallux to fifth toes bilaterally. No evidence of bacterial infection or drainage bilaterally.  Orthopedic  No limitations of motion  feet .  No crepitus or effusions noted.  No bony pathology or digital deformities noted.  HAV  B/L.  Skin  normotropic skin with no porokeratosis noted bilaterally.  No signs of infections or ulcers noted.     Onychomycosis  Pain in right toes  Pain in left toes  Diabetes    Consent was obtained for treatment procedures.   Mechanical debridement of nails 1-5  bilaterally performed with a nail nipper.  Filed with dremel without incident.     Return office visit   10 weeks                  Told patient to return for periodic foot care and evaluation due to potential at risk complications.   Taelor Waymire DPM  

## 2021-10-23 ENCOUNTER — Inpatient Hospital Stay: Payer: Medicare Other | Attending: Obstetrics and Gynecology | Admitting: Obstetrics and Gynecology

## 2021-10-23 ENCOUNTER — Other Ambulatory Visit: Payer: Self-pay

## 2021-10-23 ENCOUNTER — Inpatient Hospital Stay: Payer: Medicare Other

## 2021-10-23 ENCOUNTER — Encounter: Payer: Self-pay | Admitting: Obstetrics and Gynecology

## 2021-10-23 VITALS — BP 154/78 | HR 77 | Temp 97.8°F | Resp 16 | Wt 195.0 lb

## 2021-10-23 DIAGNOSIS — C541 Malignant neoplasm of endometrium: Secondary | ICD-10-CM

## 2021-10-23 DIAGNOSIS — Z90710 Acquired absence of both cervix and uterus: Secondary | ICD-10-CM | POA: Insufficient documentation

## 2021-10-23 DIAGNOSIS — R109 Unspecified abdominal pain: Secondary | ICD-10-CM

## 2021-10-23 DIAGNOSIS — Z8616 Personal history of COVID-19: Secondary | ICD-10-CM | POA: Diagnosis not present

## 2021-10-23 DIAGNOSIS — Z9221 Personal history of antineoplastic chemotherapy: Secondary | ICD-10-CM | POA: Insufficient documentation

## 2021-10-23 DIAGNOSIS — Z90722 Acquired absence of ovaries, bilateral: Secondary | ICD-10-CM | POA: Diagnosis not present

## 2021-10-23 LAB — CREATININE, SERUM
Creatinine, Ser: 0.84 mg/dL (ref 0.44–1.00)
GFR, Estimated: >60 mL/min (ref >60)

## 2021-10-23 NOTE — Progress Notes (Signed)
Gynecologic Oncology Interval Visit   Referring Provider: Dr. Amalia Hailey  Chief Complaint: Stage IIIC-1 serous endometrial cancer  Subjective:  Sandra Fischer is a 70 y.o. G2P0 female (s/p laparotomy myomectomy for leiomyoma), initially seen in consultation from Dr. 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   Today, she complains of some lower abdominal pain and takes Ducolax for constipation.      Patient had COVID19 12 days ago.  Tested because she thought she had asthma or pneumonia.   Now asymptomatic.   CA 125 07/08/21 11.4 01/02/21  12.6 09/10/20 11.2 07/03/20  13.6  CT 02/14/21 chest IMPRESSION: 1. Left apical 4 mm ground-glass nodule is unchanged, considered benign. 2. A right lower lobe solid 3 mm nodule is also not significantly changed and can be presumed benign. 3.  No acute process or evidence of metastatic disease in the chest. 4.  Aortic Atherosclerosis (ICD10-I70.0). 5. Mild hepatic steatosis. 6.  Tiny hiatal hernia.   Gynecologic Oncology History:  She initially presented to Dr. Amalia Hailey as [phone] female with complaint of postmenopausal bleeding, intermittent, heavy at times.    Transabdominal ultrasound on 05/05/2019 demonstrated endometrium measuring 62m. Uterus anteverted measuring 11.3 x 6.6 x 7.9 cm, heterogeous echo texture w/o evidence of focal masses. Within uterus multiple suspected fibroids measuring 7.2 x 4.8 x 6.6 cm and 2.6 x 1.7 x 3.1 cm. Ovaries not visualized. No adnexal masses. No free fluid in cul de sac.   Endometrial biopsy was performed on 05/20/2019 and demonstrated high-grade mixed endometrioid, predominantly, and serous carcinoma.  She complains of persistent bleeding and fatigue which she attributes to the bleeding. She presents today for management. She has significant medical issues including rheumatoid arthritis requiring chronic  steroids (5-10 mg daily) for a long time (she does not know how long) and leflunomide (ARAVA). She also has undergone myomectomy for leiomyoma and abdominoplasty.  She also has a history of cardiac disease. She Cardioversion for SVT on 10/17/2018. Her cardiologist Dr. [name] [facility] pain and shortness of breath, noted to be in SVT, converted to sinus rhythm with adenosine, in the setting of colitis, anemia with hemoglobin 8.5, and untreated hypothyroidism with TSH 25, which has since returned to normal range.  2D echocardiogram revealed moderately reduced LV function with LVEF 35 to 40% with diffuse hypokinesis. The patient underwent Lexiscan Myoview on 01/19/2019, which revealed revealed LVEF 49%, a mild fixed inferior wall defect, scar versus artifact, with no evidence of ischemia. She was last seen on 04/26/2019 by Cardiology ([name] with a plan to stay on her current medications and she was counseled about low sodium diet, DASH, and continuing hyperlipidemia medications.   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 She is married and provides care for her mother.   06/14/2019 CT C/A/P IMPRESSION: 1. Bulky fibroid uterus. There is no direct noncontrast CT evidence of endometrial malignancy. No evidence of lymphadenopathy or metastatic disease in the chest, abdomen, or pelvis. Please note that noncontrast CT is limited for the evaluation of solid organ metastases. 2. Stable 4 mm ground-glass pulmonary nodule of the left pulmonary apex (series 3, image 24). Although nonspecific, this is an unlikely isolated manifestation of metastatic disease. Attention on follow-up.  3. Chronic, incidental, and postoperative findings as detailed above.  She underwent TLH_BSO with [name] 06/21/2019.   Tumor size 9.3 cm, invading 99% of  myometrium (3.55 of 3.6 cm), positive right external iliac sentinel lymph node. Negative washings. MSS/MMR -  Intact/normal. HER2 - negative at 28%/1+.   Case was discussed at [place] on 07/04/2019. Possible PORTEC3 regimen given subgroup analysis showing benefit in serous history vs clinical trial was discussed.   Based on pathology, adjuvant chemotherapy & radiation was recommended. She saw Dr. [name] on 07/14/2019 and lengthy discussion regarding rationale of adjuvant treatment. She declined adjuvant treatment.  10/20/2019- CT Chest Abdomen Pelvis W Contrast multiple newly enlarged retroperitoneal and right iliac lymph nodes, the largest left retroperitoneal node measuring 1.9 x 1.6 cm (series 2, image 71), concerning for nodal metastatic disease. 3. Unchanged 4 mm ground-glass pulmonary nodule of the left pulmonary apex (series 3, image 24). This remains nonspecific although is again an unlikely manifestation of pulmonary metastatic disease.  Findings consistent with recurrent disease with adenopathy.   11/07/2019-02/20/20 She agreed to treatment and received 6 cycles of carbo-taxol chemotherapy  (dose reduced d/t neuropathy).   03/12/2020 CT Abdomen/pelvis The multiple enlarged retroperitoneal and right iliac lymph nodes identified as new on the previous exam from 10/20/2019 have resolved completely in the interval. 2. Tiny ground-glass pulmonary nodule medial left lung apex is stable in the interval. Likely benign, continued attention on follow-up recommended.  Data from GOG 258 was discussed including no differences in relapse free survival, fewer lower vaginal recurrences and fewer lower pelvic and para-aortic relapsed with the addition of RT. She was seen by Dr. [name] who did not recommend WPRT.   Post chemotherapy CT showed excellent response. Previously enlarged retroperitoneal and right iliac nodes have resolved. Adjuvant radiation was not recommended.   Problem List: Patient Active Problem List   Diagnosis Date Noted   Diabetes mellitus without complication ([place]) 35/46/5681   Nuclear  sclerotic cataract of right eye 04/03/2021   Severe persistent asthma 04/03/2021   Problem related to unspecified psychosocial circumstances 04/03/2021   Corn of foot 04/03/2021   Dependence on continuous positive airway pressure ventilation 04/03/2021   Dermatophytosis of nail 04/03/2021   Type 2 or unspecified type diabetes mellitus 04/03/2021   Disorder of bone and cartilage 04/03/2021   Dysphagia 04/03/2021   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   History of recurrent pneumonia 04/03/2021   Irritable colon 04/03/2021   Low back pain 04/03/2021   Other alveolar and parietoalveolar pneumonopathy 04/03/2021   Other and unspecified hyperlipidemia 04/03/2021   Other specified disease of nail 04/03/2021   Pain in joint, ankle and foot 04/03/2021   Polyp of colon 04/03/2021   Seborrheic dermatitis 04/03/2021   SVT (supraventricular tachycardia) ([place]) 04/03/2021   Vitiligo 04/03/2021   Vocal cord dysfunction 04/03/2021   Obesity 04/03/2021   Allergic asthma 04/03/2021   Obstructive sleep apnea 04/03/2021   Encounter for long-term (current) use of steroids 04/03/2021   Diabetic neuropathy ([place]) 08/02/2020   Iron deficiency anemia 07/03/2020   Vaginal discharge 05/30/2020   Encounter for antineoplastic chemotherapy 01/30/2020   Neuropathy due to chemotherapeutic drug ([place]) 01/30/2020   Anemia 01/30/2020   Gastroesophageal reflux disease 12/08/2019   Microcytic anemia 11/07/2019   Port-A-Cath in place 11/04/2019   Rheumatoid arthritis ([place]) 10/29/2019   Goals of care, counseling/discussion 07/20/2019   Endometrial cancer ([place]) 07/15/2019   Acute blood loss anemia 06/22/2019   Hypothyroidism, adult 06/15/2019   Myalgia and myositis 06/15/2019   Fibromyalgia    Cardiomyopathy ([place]) 12/06/2018   Atypical chest pain 12/06/2018   Perennial and seasonal allergic rhinitis 02/09/2018   Moderate  persistent asthma 02/09/2018   Allergic conjunctivitis 02/09/2018   History of food allergy  02/09/2018   Allergic reaction 02/09/2018   RA (rheumatoid arthritis) ([place]) 08/22/2016   HTN (hypertension) 08/22/2016    Past Medical History: Past Medical History:  Diagnosis Date   Asthma    Asthma    Asthma exacerbation 08/22/2016   Cancer ([place])    Collagen vascular disease ([place])    Diabetes mellitus without complication ([place])    History of Diabetes    Environmental allergies    Fibromyalgia    High cholesterol    Hypertension    Iron deficiency anemia 07/03/2020   RA (rheumatoid arthritis) ([place])    Thyroid disease     Past Surgical History: Past Surgical History:  Procedure Laterality Date   CATARACT EXTRACTION     CHOLECYSTECTOMY     fibroids removed     PORTA CATH REMOVAL N/A 02/14/2021   Procedure: PORTA CATH REMOVAL;  Surgeon: Algernon Huxley, MD;  Location: [facility];  Service: Cardiovascular;  Laterality: N/A;   THYROIDECTOMY, PARTIAL     tummy tuck      Past Gynecologic History:  As per HPI  OB History:  OB History  Gravida Para Term Preterm AB Living  2       2    SAB IAB Ectopic Multiple Live Births  2            # Outcome Date GA Lbr Len/2nd Weight Sex Delivery Anes PTL Lv  2 SAB           1 SAB             Family History: Family History  Problem Relation Age of Onset   Diabetes Mother    Hypertension Mother    Hyperlipidemia Mother    Dementia Mother    Breast cancer Neg Hx    Ovarian cancer Neg Hx    Colon cancer Neg Hx     Social History: Social History   Socioeconomic History   Marital status: Married    Spouse name: Cedric    Number of children: Not on file   Years of education: Not on file   Highest education level: Not on file  Occupational History   Occupation: Retired  Tobacco Use   Smoking status: Former    Packs/day: 0.10    Years: 0.25    Pack years: 0.03    Types: Cigarettes    Quit date: 1980    Years since quitting: 42.8   Smokeless tobacco: Never  Vaping Use   Vaping Use: Never used  Substance and  Sexual Activity   Alcohol use: No   Drug use: No   Sexual activity: Yes    Birth control/protection: Post-menopausal  Other Topics Concern   Not on file  Social History Narrative   Not on file   Social Determinants of Health   Financial Resource Strain: Not on file  Food Insecurity: Not on file  Transportation Needs: Not on file  Physical Activity: Not on file  Stress: Not on file  Social Connections: Not on file  Intimate Partner Violence: Not on file   Immunization History  Administered Date(s) Administered   Influenza, High Dose Seasonal PF 10/03/2019   Influenza, Seasonal, Injecte, Preservative Fre 03/01/2013, 09/23/2013, 01/21/2016   Influenza,inj,Quad PF,6+ Mos 10/22/2016, 12/03/2017   Influenza-Unspecified 10/29/2000, 12/29/2000, 10/26/2008, 10/29/2008, 01/27/2012   PFIZER Comirnaty(Gray Top)Covid-19 Tri-Sucrose Vaccine 01/08/2021   PFIZER(Purple Top)SARS-COV-2 Vaccination 04/19/2020, 05/10/2020  Pneumococcal Conjugate-13 09/23/2013   Pneumococcal Polysaccharide-23 12/29/2000     Allergies: Allergies  Allergen Reactions   Abatacept Hives   Codeine Hives, Nausea And Vomiting and Nausea Only   Iodine Swelling   Latex Itching, Hives and Rash   Shellfish Allergy Swelling   Trimethoprim Hydrochloride [Trimethoprim] Other (See Comments)    Pancreatitis   Penicillins Nausea And Vomiting    Current Medications: Current Outpatient Medications  Medication Sig Dispense Refill   Acetaminophen (TYLENOL EXTRA STRENGTH PO) Take 1,300 mg by mouth 2 (two) times daily as needed.     albuterol (ACCUNEB) 1.25 MG/3ML nebulizer solution Take 1 ampule by nebulization every 6 (six) hours as needed for wheezing.     albuterol (PROVENTIL HFA;VENTOLIN HFA) 108 (90 Base) MCG/ACT inhaler Inhale 2 puffs into the lungs every 6 (six) hours as needed for wheezing or shortness of breath. 1 Inhaler 0   ascorbic acid (VITAMIN C) 500 MG tablet Take 500 mg by mouth daily.     aspirin EC 81 MG  tablet Take 81 mg by mouth daily.     azelastine (ASTELIN) 0.1 % nasal spray Place 2 sprays into both nostrils 2 (two) times daily. 30 mL 5   calcium carbonate (OS-CAL - DOSED IN MG OF ELEMENTAL CALCIUM) 1250 (500 Ca) MG tablet Take 1 tablet by mouth daily with breakfast.      cetirizine (ZYRTEC) 10 MG tablet Take 10 mg by mouth daily.     Cholecalciferol (D3 ADULT PO) Take 1 capsule by mouth daily.     clobetasol ointment (TEMOVATE) 9.44 % Apply 1 application topically 3 (three) times a week. 60 g 1   clotrimazole (GYNE-LOTRIMIN) 1 % vaginal cream Place 1 Applicatorful vaginally daily.     desloratadine (CLARINEX) 5 MG tablet Take 5 mg by mouth daily.     docusate sodium (COLACE) 50 MG capsule Take 50 mg by mouth 2 (two) times daily.     EPINEPHrine 0.3 mg/0.3 mL IJ SOAJ injection Use as directed for severe allergic reaction. 2 Device 1   FEROSUL 325 (65 Fe) MG tablet TAKE ONE TABLET BY MOUTH TWICE DAILY WITH A MEAL 60 tablet 1   fluticasone (FLONASE) 50 MCG/ACT nasal spray Place 1 spray into both nostrils daily.      fluticasone-salmeterol (ADVAIR) 250-50 MCG/ACT AEPB Inhale 1 puff into the lungs in the morning and at bedtime.     gabapentin (NEURONTIN) 300 MG capsule Take 1 capsule (300 mg total) by mouth 3 (three) times daily. 90 capsule 1   guaiFENesin-dextromethorphan (ROBITUSSIN DM) 100-10 MG/5ML syrup Take 5 mLs by mouth every 4 (four) hours as needed for cough. 118 mL 0   indomethacin (INDOCIN) 50 MG capsule Take 50 mg by mouth in the morning, at noon, in the evening, and at bedtime.     ipratropium (ATROVENT) 0.02 % nebulizer solution Take 0.5 mg by nebulization every 4 (four) hours as needed.      ketotifen (ZADITOR) 0.025 % ophthalmic solution Place 1 drop into both eyes 2 (two) times daily as needed.     leflunomide (ARAVA) 20 MG tablet Take 20 mg by mouth daily.     levothyroxine (SYNTHROID, LEVOTHROID) 88 MCG tablet Take 88 mcg by mouth daily before breakfast.      lidocaine-prilocaine (EMLA) cream Apply to affected area once 30 g 3   lisinopril (PRINIVIL,ZESTRIL) 40 MG tablet Take 20 mg by mouth in the morning and at bedtime.      metoprolol succinate (TOPROL-XL) 50  MG 24 hr tablet Take 50 mg by mouth daily. Take with or immediately following a meal.     montelukast (SINGULAIR) 10 MG tablet Take 10 mg by mouth at bedtime.     nitroGLYCERIN (NITROSTAT) 0.4 MG SL tablet Place under the tongue. Place 1 tablet (0.4 mg total) under the tongue every 5 (five) minutes as needed for Chest pain May take up to 3 doses.     olopatadine (PATANOL) 0.1 % ophthalmic solution Place 1 drop into both eyes 2 (two) times daily. 5 mL 5   omeprazole (PRILOSEC) 10 MG capsule Take 20 mg by mouth daily.     pravastatin (PRAVACHOL) 40 MG tablet Take 40 mg by mouth daily.     Prenatal Vit-Fe Fumarate-FA (MULTIVITAMIN-PRENATAL) 27-0.8 MG TABS tablet Take 1 tablet by mouth daily.      prochlorperazine (COMPAZINE) 10 MG tablet Take 1 tablet (10 mg total) by mouth every 6 (six) hours as needed (Nausea or vomiting). 30 tablet 1   senna-docusate (SENOKOT-S) 8.6-50 MG tablet Take 1 tablet by mouth at bedtime as needed for mild constipation.     tiotropium (SPIRIVA) 18 MCG inhalation capsule Place 18 mcg into inhaler and inhale daily.     traMADol (ULTRAM) 50 MG tablet Take 100 mg by mouth as needed (for rheumatoid arthritis).     verapamil (CALAN) 120 MG tablet Take 240 mg by mouth 2 (two) times daily.     furosemide (LASIX) 10 MG/ML solution Take by mouth daily. (Patient not taking: Reported on 10/23/2021)     No current facility-administered medications for this visit.   Review of Systems General:  no complaints Skin: no complaints Eyes: no complaints HEENT: no complaints  Pulmonary: no complaints Cardiac: no complaints Gastrointestinal: no complaints Genitourinary/Sexual: no complaints Ob/Gyn: no complaints Musculoskeletal: no complaints Hematology: no  complaints Neurologic/Psych: no complaints   Objective:  Physical Examination:  BP (!) 154/78   Pulse 77   Temp 97.8 F (36.6 C)   Resp 16   Wt 195 lb (88.5 kg)   SpO2 94%   BMI 35.67 kg/m     ECOG Performance Status: 1 - Symptomatic but completely ambulatory   GENERAL: Patient is a well appearing female in no acute distress. Arrived in wheelchair with husband. HEENT:  Sclera clear. Anicteric NODES:  Negative axillary, supraclavicular, inguinal lymph node survery LUNGS:  Clear to auscultation bilaterally.   HEART:  Regular rate and rhythm.  ABDOMEN:  Soft, nontender.  No hernias, incisions well healed. No masses or ascites EXTREMITIES:  No peripheral edema. Atraumatic. No cyanosis SKIN:  Clear with no obvious rashes or skin changes.  NEURO:  Nonfocal. Well oriented.  Appropriate affect.  Pelvic: exam chaperoned by CMA EGBUS: no lesions Cervix: surgically absent Vagina: no lesions, or bleeding Uterus: surgically absent BME: no palpable masses Rectovaginal: confirmatory  Labs: No labs on site today  Radiologic Imaging:  No imaging on site today    Assessment:  Sandra Fischer is a 70 y.o. 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  Tumor size 9.3 cm, invading 99% of myometrium (3.55 of 3.6cm), positive right external iliac sentinel lymph nodes. Negative washings. Recurrent disease 10/20/2019 with adenopathy up to 1.9 x 1.6 cm with CR to chemotherapy with paclitaxel/carboplatin completed 01/2020.  Normal exam today.  Some lower abdominal pain for a few months.  CT scan chest 2/22  no change in indeterminate nodules. Pulmonary nodule- likely benign based on stability on imaging.   Grade 2 peripheral neuropathy.    Patient had COVID19 12 days ago and now asymptomatic.    Medical co-morbidities complicating care: She has  significant medical issues including rheumatoid arthritis requiring chronic steroids (5-10 mg daily) for a long time (she does not know how long) and leflunomide (ARAVA); HTN; Dilated cardiomyopathy; hyperlipidemia; multiple intra-abdominal surgeries (myomectomy for leiomyoma, cholecystectomy, and abdominoplasty) ; obesity Body mass index is 35.67 kg/m.  Plan:   Problem List Items Addressed This Visit       Genitourinary   Endometrial cancer ([place]) - Primary    Since she had stage IIC1 disease and has abdominal pain, will check CT scan C/A/P to rule out recurrence.  Exam reassuring.    Continue follow up with Dr. [name] and Gyn Onc q69mo Pulmonary nodule unchanged and felt to be benign. No additional surveillance recommended.    I personally interviewed and examined the patient. Agreed with the above/below plan of care. I have directly contributed to assessment and plan of care of this patient and educated and discussed with patient and family.  AMellody Drown MD

## 2021-11-11 ENCOUNTER — Ambulatory Visit
Admission: RE | Admit: 2021-11-11 | Discharge: 2021-11-11 | Disposition: A | Discharge status: Home / Self Care | Payer: Medicare Other | Source: Ambulatory Visit | Attending: Nurse Practitioner | Admitting: Nurse Practitioner

## 2021-11-11 ENCOUNTER — Other Ambulatory Visit: Payer: Self-pay

## 2021-11-11 DIAGNOSIS — C541 Malignant neoplasm of endometrium: Secondary | ICD-10-CM | POA: Insufficient documentation

## 2021-11-11 MED ORDER — IOHEXOL 350 MG/ML SOLN
100.0000 mL | Freq: Once | INTRAVENOUS | Status: AC | PRN
  Administered 2021-11-11: 100 mL via INTRAVENOUS

## 2021-11-14 ENCOUNTER — Telehealth: Payer: Self-pay

## 2021-11-14 NOTE — Telephone Encounter (Signed)
Called results of CT scan to Sandra Fischer. All questions answered.  IMPRESSION: 1. Stable examination without new or progressive findings in the chest abdomen or pelvis. 2. Stable benign small pulmonary nodules. 3. Left-sided colonic diverticulosis without findings of acute diverticulitis.

## 2021-11-15 ENCOUNTER — Telehealth: Payer: Self-pay

## 2021-11-15 NOTE — Telephone Encounter (Signed)
Pt informed of results per Beckey Rutter, NP. Pt verbalized understanding with no further questions or concerns.

## 2021-11-15 NOTE — Telephone Encounter (Signed)
-----   Message from Verlon Au, NP sent at 11/14/2021  4:04 PM EST ----- Please let patient know that there is no evidence of new or progressive cancer on CT scan.   ----- Message ----- From: Interface, Rad Results In Sent: 11/12/2021  10:01 AM EST To: Verlon Au, NP

## 2021-12-04 ENCOUNTER — Ambulatory Visit: Admission: RE | Admit: 2021-12-04 | Payer: Medicare Other | Source: Home / Self Care | Admitting: Internal Medicine

## 2021-12-04 SURGERY — COLONOSCOPY WITH PROPOFOL
Anesthesia: General

## 2021-12-06 ENCOUNTER — Other Ambulatory Visit: Payer: Self-pay

## 2021-12-06 ENCOUNTER — Encounter: Payer: Self-pay | Admitting: Oncology

## 2021-12-06 ENCOUNTER — Emergency Department: Payer: No Typology Code available for payment source

## 2021-12-06 ENCOUNTER — Emergency Department
Admission: EM | Admit: 2021-12-06 | Discharge: 2021-12-06 | Disposition: A | Discharge status: Home / Self Care | Payer: No Typology Code available for payment source | Attending: Emergency Medicine | Admitting: Emergency Medicine

## 2021-12-06 DIAGNOSIS — R0789 Other chest pain: Secondary | ICD-10-CM | POA: Insufficient documentation

## 2021-12-06 DIAGNOSIS — R079 Chest pain, unspecified: Secondary | ICD-10-CM

## 2021-12-06 DIAGNOSIS — I10 Essential (primary) hypertension: Secondary | ICD-10-CM | POA: Diagnosis not present

## 2021-12-06 DIAGNOSIS — Z91040 Latex allergy status: Secondary | ICD-10-CM | POA: Diagnosis not present

## 2021-12-06 DIAGNOSIS — Z87891 Personal history of nicotine dependence: Secondary | ICD-10-CM | POA: Diagnosis not present

## 2021-12-06 DIAGNOSIS — Z7982 Long term (current) use of aspirin: Secondary | ICD-10-CM | POA: Insufficient documentation

## 2021-12-06 DIAGNOSIS — Z79899 Other long term (current) drug therapy: Secondary | ICD-10-CM | POA: Insufficient documentation

## 2021-12-06 DIAGNOSIS — E039 Hypothyroidism, unspecified: Secondary | ICD-10-CM | POA: Insufficient documentation

## 2021-12-06 DIAGNOSIS — E119 Type 2 diabetes mellitus without complications: Secondary | ICD-10-CM | POA: Diagnosis not present

## 2021-12-06 DIAGNOSIS — J45909 Unspecified asthma, uncomplicated: Secondary | ICD-10-CM | POA: Diagnosis not present

## 2021-12-06 DIAGNOSIS — Z8542 Personal history of malignant neoplasm of other parts of uterus: Secondary | ICD-10-CM | POA: Diagnosis not present

## 2021-12-06 DIAGNOSIS — Z7952 Long term (current) use of systemic steroids: Secondary | ICD-10-CM | POA: Insufficient documentation

## 2021-12-06 LAB — BASIC METABOLIC PANEL
Anion gap: 8 (ref 5–15)
BUN: 14 mg/dL (ref 8–23)
CO2: 24 mmol/L (ref 22–32)
Calcium: 9.6 mg/dL (ref 8.9–10.3)
Chloride: 105 mmol/L (ref 98–111)
Creatinine, Ser: 0.87 mg/dL (ref 0.44–1.00)
GFR, Estimated: >60 mL/min (ref >60)
Glucose, Bld: 124 mg/dL — ABNORMAL HIGH (ref 70–99)
Potassium: 3.4 mmol/L — ABNORMAL LOW (ref 3.5–5.1)
Sodium: 137 mmol/L (ref 135–145)

## 2021-12-06 LAB — CBC
HCT: 37.1 % (ref 36.0–46.0)
Hemoglobin: 11.3 g/dL — ABNORMAL LOW (ref 12.0–15.0)
MCH: 25.8 pg — ABNORMAL LOW (ref 26.0–34.0)
MCHC: 30.5 g/dL (ref 30.0–36.0)
MCV: 84.7 fL (ref 80.0–100.0)
Platelets: 252 10*3/uL (ref 150–400)
RBC: 4.38 MIL/uL (ref 3.87–5.11)
RDW: 17.7 % — ABNORMAL HIGH (ref 11.5–15.5)
WBC: 10.1 10*3/uL (ref 4.0–10.5)
nRBC: 0 % (ref 0.0–0.2)

## 2021-12-06 LAB — TROPONIN I (HIGH SENSITIVITY)
Troponin I (High Sensitivity): 5 ng/L (ref <18)
Troponin I (High Sensitivity): 6 ng/L (ref <18)

## 2021-12-06 NOTE — Discharge Instructions (Signed)
Please seek medical attention for any high fevers, chest pain, shortness of breath, change in behavior, persistent vomiting, bloody stool or any other new or concerning symptoms.  

## 2021-12-06 NOTE — ED Provider Notes (Signed)
[facility] Emergency Department Provider Note   ____________________________________________   I have reviewed the triage vital signs and the nursing notes.   HISTORY  Chief Complaint Chest Pain   History limited by: Not Limited   HPI Sandra Fischer is a 70 y.o. female who presents to the emergency department today because of concern for chest pain. Started roughly 3 days ago. Located in her left and center chest. She describes it as a tightness. The patient has been under a lot of stress recently. She did have some associated shortness of breath and arm pain. She has been taking nitroglycerin at home. She has had somewhat similar pain in the past but no pain that was exactly similar to this. In addition the patient has concerns about her blood pressure. It has been running high recently. The patient called her cardiologist today and they are going to start her on an additional blood pressure medication.   Records reviewed. Per medical record review patient has a history of HTN, HLD  Past Medical History:  Diagnosis Date   Asthma    Asthma    Asthma exacerbation 08/22/2016   Cancer ([place])    Collagen vascular disease ([place])    Diabetes mellitus without complication ([place])    History of Diabetes    Environmental allergies    Fibromyalgia    High cholesterol    Hypertension    Iron deficiency anemia 07/03/2020   RA (rheumatoid arthritis) ([place])    Thyroid disease     Patient Active Problem List   Diagnosis Date Noted   Diabetes mellitus without complication ([place]) 43/32/9518   Nuclear sclerotic cataract of right eye 04/03/2021   Severe persistent asthma 04/03/2021   Problem related to unspecified psychosocial circumstances 04/03/2021   Corn of foot 04/03/2021   Dependence on continuous positive airway pressure ventilation 04/03/2021   Dermatophytosis of nail 04/03/2021   Type 2 or unspecified type diabetes mellitus 04/03/2021   Disorder of bone and  cartilage 04/03/2021   Dysphagia 04/03/2021   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   History of recurrent pneumonia 04/03/2021   Irritable colon 04/03/2021   Low back pain 04/03/2021   Other alveolar and parietoalveolar pneumonopathy 04/03/2021   Other and unspecified hyperlipidemia 04/03/2021   Other specified disease of nail 04/03/2021   Pain in joint, ankle and foot 04/03/2021   Polyp of colon 04/03/2021   Seborrheic dermatitis 04/03/2021   SVT (supraventricular tachycardia) ([place]) 04/03/2021   Vitiligo 04/03/2021   Vocal cord dysfunction 04/03/2021   Obesity 04/03/2021   Allergic asthma 04/03/2021   Obstructive sleep apnea 04/03/2021   Encounter for long-term (current) use of steroids 04/03/2021   Diabetic neuropathy ([place]) 08/02/2020   Iron deficiency anemia 07/03/2020   Vaginal discharge 05/30/2020   Encounter for antineoplastic chemotherapy 01/30/2020   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   Anemia 01/30/2020   Gastroesophageal reflux disease 12/08/2019   Microcytic anemia 11/07/2019   Port-A-Cath in place 11/04/2019   Rheumatoid arthritis ([place]) 10/29/2019   Goals of care, counseling/discussion 07/20/2019   Endometrial cancer ([place]) 07/15/2019   Acute blood loss anemia 06/22/2019   Hypothyroidism, adult 06/15/2019   Myalgia and myositis 06/15/2019   Fibromyalgia    Cardiomyopathy ([place]) 12/06/2018   Atypical chest pain 12/06/2018   Perennial and seasonal allergic rhinitis 02/09/2018   Moderate persistent asthma 02/09/2018   Allergic conjunctivitis 02/09/2018   History of food allergy 02/09/2018   Allergic reaction 02/09/2018   RA (rheumatoid arthritis) ([place]) 08/22/2016  HTN (hypertension) 08/22/2016    Past Surgical History:  Procedure Laterality Date   CATARACT EXTRACTION     CHOLECYSTECTOMY     fibroids removed     PORTA CATH REMOVAL N/A 02/14/2021   Procedure: PORTA CATH REMOVAL;  Surgeon: Algernon Huxley, MD;  Location: [facility];   Service: Cardiovascular;  Laterality: N/A;   THYROIDECTOMY, PARTIAL     tummy tuck      Prior to Admission medications   Medication Sig Start Date End Date Taking? Authorizing Provider  Acetaminophen (TYLENOL EXTRA STRENGTH PO) Take 1,300 mg by mouth 2 (two) times daily as needed.    [provider]  albuterol (ACCUNEB) 1.25 MG/3ML nebulizer solution Take 1 ampule by nebulization every 6 (six) hours as needed for wheezing.    [provider]  albuterol (PROVENTIL HFA;VENTOLIN HFA) 108 (90 Base) MCG/ACT inhaler Inhale 2 puffs into the lungs every 6 (six) hours as needed for wheezing or shortness of breath. 08/22/16   Nance Pear, MD  ascorbic acid (VITAMIN C) 500 MG tablet Take 500 mg by mouth daily. 08/02/14   [provider]  aspirin EC 81 MG tablet Take 81 mg by mouth daily.    [provider]  azelastine (ASTELIN) 0.1 % nasal spray Place 2 sprays into both nostrils 2 (two) times daily. 02/09/18   Bobbitt, [place] Muta, MD  calcium carbonate (OS-CAL - DOSED IN MG OF ELEMENTAL CALCIUM) 1250 (500 Ca) MG tablet Take 1 tablet by mouth daily with breakfast.     [provider]  cetirizine (ZYRTEC) 10 MG tablet Take 10 mg by mouth daily.    [provider]  Cholecalciferol (D3 ADULT PO) Take 1 capsule by mouth daily.    [provider]  clobetasol ointment (TEMOVATE) 8.10 % Apply 1 application topically 3 (three) times a week. 07/10/21 07/10/22  Harlin Heys, MD  clotrimazole (GYNE-LOTRIMIN) 1 % vaginal cream Place 1 Applicatorful vaginally daily. 05/10/21   [provider]  desloratadine (CLARINEX) 5 MG tablet Take 5 mg by mouth daily.    [provider]  docusate sodium (COLACE) 50 MG capsule Take 50 mg by mouth 2 (two) times daily.    [provider]  EPINEPHrine 0.3 mg/0.3 mL IJ SOAJ injection Use as directed for severe allergic reaction. 05/12/18   Bobbitt, [place] Muta, MD  FEROSUL 325 (65 Fe) MG  tablet TAKE ONE TABLET BY MOUTH TWICE DAILY WITH A MEAL 08/20/20   Earlie Server, MD  fluticasone [facility]) 50 MCG/ACT nasal spray Place 1 spray into both nostrils daily.     [provider]  fluticasone-salmeterol (ADVAIR) 250-50 MCG/ACT AEPB Inhale 1 puff into the lungs in the morning and at bedtime. 08/04/20   [provider]  furosemide (LASIX) 10 MG/ML solution Take by mouth daily. Patient not taking: Reported on 10/23/2021    [provider]  gabapentin (NEURONTIN) 300 MG capsule Take 1 capsule (300 mg total) by mouth 3 (three) times daily. 07/03/20   Earlie Server, MD  guaiFENesin-dextromethorphan (ROBITUSSIN DM) 100-10 MG/5ML syrup Take 5 mLs by mouth every 4 (four) hours as needed for cough. 08/23/16   Dustin Flock, MD  indomethacin (INDOCIN) 50 MG capsule Take 50 mg by mouth in the morning, at noon, in the evening, and at bedtime. 01/23/11   [provider]  ipratropium (ATROVENT) 0.02 % nebulizer solution Take 0.5 mg by nebulization every 4 (four) hours as needed.     [provider]  ketotifen (ZADITOR) 0.025 % ophthalmic solution Place 1 drop into both eyes 2 (two) times daily as needed. 04/25/21 04/26/22  [provider]  leflunomide (ARAVA) 20 MG tablet Take 20 mg by mouth daily.    [provider]  levothyroxine (SYNTHROID, LEVOTHROID) 88 MCG tablet Take 88 mcg by mouth daily before breakfast.    [provider]  lidocaine-prilocaine (EMLA) cream Apply to affected area once 10/27/19   Earlie Server, MD  lisinopril (PRINIVIL,ZESTRIL) 40 MG tablet Take 20 mg by mouth in the morning and at bedtime.     [provider]  metoprolol succinate (TOPROL-XL) 50 MG 24 hr tablet Take 50 mg by mouth daily. Take with or immediately following a meal.    [provider]  montelukast (SINGULAIR) 10 MG tablet Take 10 mg by mouth at bedtime.    [provider]  nitroGLYCERIN (NITROSTAT) 0.4 MG SL tablet Place under the tongue.  Place 1 tablet (0.4 mg total) under the tongue every 5 (five) minutes as needed for Chest pain May take up to 3 doses. 11/20/20 11/20/21  [provider]  olopatadine (PATANOL) 0.1 % ophthalmic solution Place 1 drop into both eyes 2 (two) times daily. 02/09/18   Bobbitt, [place] Muta, MD  omeprazole (PRILOSEC) 10 MG capsule Take 20 mg by mouth daily.    [provider]  pravastatin (PRAVACHOL) 40 MG tablet Take 40 mg by mouth daily.    [provider]  Prenatal Vit-Fe Fumarate-FA (MULTIVITAMIN-PRENATAL) 27-0.8 MG TABS tablet Take 1 tablet by mouth daily.     [provider]  prochlorperazine (COMPAZINE) 10 MG tablet Take 1 tablet (10 mg total) by mouth every 6 (six) hours as needed (Nausea or vomiting). 10/27/19   Earlie Server, MD  senna-docusate (SENOKOT-S) 8.6-50 MG tablet Take 1 tablet by mouth at bedtime as needed for mild constipation. 06/24/19   [provider]  tiotropium (SPIRIVA) 18 MCG inhalation capsule Place 18 mcg into inhaler and inhale daily.    [provider]  traMADol (ULTRAM) 50 MG tablet Take 100 mg by mouth as needed (for rheumatoid arthritis).    [provider]  verapamil (CALAN) 120 MG tablet Take 240 mg by mouth 2 (two) times daily.    [provider]    Allergies Abatacept, Codeine, Iodine, Latex, Shellfish allergy, Trimethoprim hydrochloride [trimethoprim], and Penicillins  Family History  Problem Relation Age of Onset   Diabetes Mother    Hypertension Mother    Hyperlipidemia Mother    Dementia Mother    Breast cancer Neg Hx    Ovarian cancer Neg Hx    Colon cancer Neg Hx     Social History Social History   Tobacco Use   Smoking status: Former    Packs/day: 0.10    Years: 0.25    Pack years: 0.03    Types: Cigarettes    Quit date: 1980    Years since quitting: 42.9   Smokeless tobacco: Never  Vaping Use   Vaping Use: Never used  Substance Use Topics   Alcohol use: No   Drug use: No     Review of Systems Constitutional: No fever/chills Eyes: No visual changes. ENT: No sore throat. Cardiovascular: Positive for chest pain. Respiratory: Positive for shortness of breath. Gastrointestinal: No abdominal pain.  No nausea, no vomiting.  No diarrhea.   Genitourinary: Negative for dysuria. Musculoskeletal: Negative for back pain. Skin: Negative for rash. Neurological: Positive for headache.  ____________________________________________   PHYSICAL EXAM:  VITAL SIGNS: ED Triage Vitals [12/06/21 1325]  Enc Vitals Group     BP 118/66     Pulse Rate 60     Resp 16     Temp      Temp src      SpO2 95 %     Weight 193 lb (87.5 kg)     Height 5\' 2"  (1.575 m)     Head Circumference      Peak Flow      Pain Score 7   Constitutional: Alert and oriented.  Eyes: Conjunctivae are normal.  ENT      Head: Normocephalic and atraumatic.      Nose: No congestion/rhinnorhea.      Mouth/Throat: Mucous membranes are moist.      Neck: No stridor. Hematological/Lymphatic/Immunilogical: No cervical lymphadenopathy. Cardiovascular: Normal rate, regular rhythm.  No murmurs, rubs, or gallops.  Respiratory: Normal respiratory effort without tachypnea nor retractions. Breath sounds are clear and equal bilaterally. No wheezes/rales/rhonchi. Gastrointestinal: Soft and non tender. No rebound. No guarding.  Genitourinary: Deferred Musculoskeletal: Normal range of motion in all extremities. No lower extremity edema. Neurologic:  Normal speech and language. No gross focal neurologic deficits are appreciated.  Skin:  Skin is warm, dry and intact. No rash noted. Psychiatric: Mood and affect are normal. Speech and behavior are normal. Patient exhibits appropriate insight and judgment.  ____________________________________________    LABS (pertinent positives/negatives)  Trop hs 5 to 6 CBC wbc 10.1, hgb 11.3, plt 252 BMP na 137, k 3.4, glu 124, cr  0.87  ____________________________________________   EKG  I, Nance Pear, attending physician, personally viewed and interpreted this EKG  EKG Time: 1320 Rate: 60 Rhythm: normal sinus rhythm Axis: normal Intervals: qtc 432 QRS: narrow, low voltage ST changes: no st elevation Impression: abnormal ekg ____________________________________________    RADIOLOGY  CXR No acute abnormality  ____________________________________________   PROCEDURES  Procedures  ____________________________________________   INITIAL IMPRESSION / ASSESSMENT AND PLAN / ED COURSE  Pertinent labs & imaging results that were available during my care of the patient were reviewed by me and considered in my medical decision making (see chart for details).   Patient presented to the emergency department today because of concerns for chest pain.  Troponin was negative x2.  Chest x-ray without any.  This time I doubt PE or dissection given clinical history.  Patient is under a lot of stress currently and I do wonder if that is playing a role.  Given negative work-up here in the emergency department I do think it is reasonable for patient be discharged.  Discussed with patient portance of close cardiology follow-up.  ____________________________________________   FINAL CLINICAL IMPRESSION(S) / ED DIAGNOSES  Final diagnoses:  Nonspecific chest pain     Note: This dictation was prepared with Dragon dictation. Any transcriptional errors that result from this process are unintentional     Nance Pear, MD 12/06/21 1757

## 2021-12-06 NOTE — ED Triage Notes (Signed)
Pt states that for the past 3 days she has been having pain in her heart, pt states that she took 2 nitro and the 2nd tab gave her relief, pt is concerned that the pain she is having is related to her heart and thinks maybe her medication needs adjusting, pt also reports elevated bp recently and some sob

## 2022-01-02 ENCOUNTER — Encounter: Payer: Self-pay | Admitting: Podiatry

## 2022-01-02 ENCOUNTER — Ambulatory Visit (INDEPENDENT_AMBULATORY_CARE_PROVIDER_SITE_OTHER): Payer: Medicare Other | Admitting: Podiatry

## 2022-01-02 ENCOUNTER — Other Ambulatory Visit: Payer: Self-pay

## 2022-01-02 DIAGNOSIS — M79675 Pain in left toe(s): Secondary | ICD-10-CM

## 2022-01-02 DIAGNOSIS — B351 Tinea unguium: Secondary | ICD-10-CM

## 2022-01-02 DIAGNOSIS — M79674 Pain in right toe(s): Secondary | ICD-10-CM

## 2022-01-02 DIAGNOSIS — E1142 Type 2 diabetes mellitus with diabetic polyneuropathy: Secondary | ICD-10-CM

## 2022-01-02 DIAGNOSIS — G620 Drug-induced polyneuropathy: Secondary | ICD-10-CM

## 2022-01-02 NOTE — Progress Notes (Signed)
This patient returns to my office for at risk foot care.  This patient requires this care by a professional since this patient will be at risk due to having diet controlled diabetes.   This patient is unable to cut nails herself since the patient cannot reach her nails.These nails are painful walking and wearing shoes.  This patient presents for at risk foot care today.  General Appearance  Alert, conversant and in no acute stress.  Vascular  Dorsalis pedis and posterior tibial  pulses are palpable  bilaterally.  Capillary return is within normal limits  bilaterally. Temperature is within normal limits  bilaterally.  Neurologic  Senn-Weinstein monofilament wire test WNL  bilaterally. Muscle power within normal limits bilaterally.  Nails Thick disfigured discolored nails with subungual debris  from hallux to fifth toes bilaterally. No evidence of bacterial infection or drainage bilaterally.  Orthopedic  No limitations of motion  feet .  No crepitus or effusions noted.  No bony pathology or digital deformities noted.  HAV  B/L.  Skin  normotropic skin with no porokeratosis noted bilaterally.  No signs of infections or ulcers noted.     Onychomycosis  Pain in right toes  Pain in left toes  Diabetes    Consent was obtained for treatment procedures.   Mechanical debridement of nails 1-5  bilaterally performed with a nail nipper.  Filed with dremel without incident.     Return office visit   10 weeks                  Told patient to return for periodic foot care and evaluation due to potential at risk complications.   Derenda Giddings DPM  

## 2022-01-13 ENCOUNTER — Other Ambulatory Visit: Payer: Self-pay

## 2022-01-13 ENCOUNTER — Inpatient Hospital Stay: Payer: Medicare Other | Attending: Oncology

## 2022-01-13 DIAGNOSIS — Z8542 Personal history of malignant neoplasm of other parts of uterus: Secondary | ICD-10-CM | POA: Diagnosis not present

## 2022-01-13 DIAGNOSIS — C541 Malignant neoplasm of endometrium: Secondary | ICD-10-CM

## 2022-01-13 DIAGNOSIS — D649 Anemia, unspecified: Secondary | ICD-10-CM | POA: Insufficient documentation

## 2022-01-13 DIAGNOSIS — M069 Rheumatoid arthritis, unspecified: Secondary | ICD-10-CM | POA: Diagnosis not present

## 2022-01-13 LAB — CBC WITH DIFFERENTIAL/PLATELET
Abs Immature Granulocytes: 0.04 10*3/uL (ref 0.00–0.07)
Basophils Absolute: 0 10*3/uL (ref 0.0–0.1)
Basophils Relative: 0 %
Eosinophils Absolute: 0.6 10*3/uL — ABNORMAL HIGH (ref 0.0–0.5)
Eosinophils Relative: 7 %
HCT: 37.4 % (ref 36.0–46.0)
Hemoglobin: 11.5 g/dL — ABNORMAL LOW (ref 12.0–15.0)
Immature Granulocytes: 1 %
Lymphocytes Relative: 33 %
Lymphs Abs: 2.8 10*3/uL (ref 0.7–4.0)
MCH: 26.1 pg (ref 26.0–34.0)
MCHC: 30.7 g/dL (ref 30.0–36.0)
MCV: 85 fL (ref 80.0–100.0)
Monocytes Absolute: 0.8 10*3/uL (ref 0.1–1.0)
Monocytes Relative: 10 %
Neutro Abs: 4.1 10*3/uL (ref 1.7–7.7)
Neutrophils Relative %: 49 %
Platelets: 268 10*3/uL (ref 150–400)
RBC: 4.4 MIL/uL (ref 3.87–5.11)
RDW: 17.9 % — ABNORMAL HIGH (ref 11.5–15.5)
WBC: 8.4 10*3/uL (ref 4.0–10.5)
nRBC: 0 % (ref 0.0–0.2)

## 2022-01-13 LAB — COMPREHENSIVE METABOLIC PANEL
ALT: 16 U/L (ref 0–44)
AST: 19 U/L (ref 15–41)
Albumin: 3.6 g/dL (ref 3.5–5.0)
Alkaline Phosphatase: 79 U/L (ref 38–126)
Anion gap: 10 (ref 5–15)
BUN: 17 mg/dL (ref 8–23)
CO2: 26 mmol/L (ref 22–32)
Calcium: 8.9 mg/dL (ref 8.9–10.3)
Chloride: 101 mmol/L (ref 98–111)
Creatinine, Ser: 0.76 mg/dL (ref 0.44–1.00)
GFR, Estimated: >60 mL/min (ref >60)
Glucose, Bld: 131 mg/dL — ABNORMAL HIGH (ref 70–99)
Potassium: 3.7 mmol/L (ref 3.5–5.1)
Sodium: 137 mmol/L (ref 135–145)
Total Bilirubin: 0.2 mg/dL — ABNORMAL LOW (ref 0.3–1.2)
Total Protein: 7.5 g/dL (ref 6.5–8.1)

## 2022-01-14 LAB — CA 125: Cancer Antigen (CA) 125: 11.1 U/mL (ref 0.0–38.1)

## 2022-01-15 ENCOUNTER — Other Ambulatory Visit: Payer: Self-pay

## 2022-01-15 ENCOUNTER — Inpatient Hospital Stay (HOSPITAL_BASED_OUTPATIENT_CLINIC_OR_DEPARTMENT_OTHER): Payer: Medicare Other | Admitting: Oncology

## 2022-01-15 ENCOUNTER — Encounter: Payer: Self-pay | Admitting: Oncology

## 2022-01-15 VITALS — BP 172/104 | HR 67 | Temp 98.5°F | Wt 203.8 lb

## 2022-01-15 DIAGNOSIS — M069 Rheumatoid arthritis, unspecified: Secondary | ICD-10-CM | POA: Diagnosis not present

## 2022-01-15 DIAGNOSIS — D649 Anemia, unspecified: Secondary | ICD-10-CM | POA: Diagnosis not present

## 2022-01-15 DIAGNOSIS — R911 Solitary pulmonary nodule: Secondary | ICD-10-CM | POA: Diagnosis not present

## 2022-01-15 DIAGNOSIS — C541 Malignant neoplasm of endometrium: Secondary | ICD-10-CM

## 2022-01-15 DIAGNOSIS — Z8542 Personal history of malignant neoplasm of other parts of uterus: Secondary | ICD-10-CM | POA: Diagnosis not present

## 2022-01-15 NOTE — Progress Notes (Signed)
Hematology/Oncology follow up note [phone] [phone]   Patient Care Team: Sandra Eth, MD as PCP - General (Internal Medicine) Sandra Jacks, RN as Oncology Nurse Navigator Sandra Filbert, MD as Radiation Oncologist (Radiation Oncology)  REFERRING PROVIDER: Felipa Eth, MD  CHIEF COMPLAINTS/REASON FOR VISIT:  Follow up for endometrial cancer HISTORY OF PRESENTING ILLNESS:   Sandra Fischer is a  71 y.o.  female with PMH listed below was seen in consultation at the request of  Sandra Eth, MD  for evaluation of endometrial cancer Patient had TH/BSO sentinel lymph node biopsy by [facility] GYN oncology Dr. Allen Fischer on 06/21/2019. Extensive medical records from [place] [facility] review was performed by me.  Tumor size 9.3 cm, invading 99% of myometrium [3.55 out of 3.6 cm], positive right external iliac sentinel lymph nodes.  Negative washing.  No LVI, serosa is uninvolved. 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 [id number]  FIGO stage IIIC1 MSI intact HER 2 negative.   Patient was seen by Dr. BFransisca Connorson 07/06/2019 due to vaginal odor. Patient presents for establishing care and discussion for adjuvant chemotherapy radiation.  Per Dr. BBlake Divinenote, [facility].  Portex 3 regimen was recommended given subgroup analysis showing benefit in the serous histology versus clinical trial. HER2 was ordered and pending results.   Patient has a history of SVT.  10/14/2018 2D echo reviewed moderately reduced LV function with LVEF 35 to 40% with diffuse hypokinesis.  Patient had Lexiscan Myoview on 01/19/2019.  LVEF 49%.  SPECT images revealed a mild fixed inferior wall defect, scar versus artifact.  No evidence of ischemia.  She follows up with cardiology Sandra Fischer 04/26/2019 with a plan to stay on her current medication. 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 She is married and providing care for her mother.  # 10/20/2019 CT chest abdomen pelvis with contrast showed disease recurrence.  There are multiple newly enlarged retroperitoneal and right iliac lymph nodes the largest the left retroperitoneal node measuring 1.9 x 1.6 cm concerning for nodal metastasis disease.  Unchanged 4 mm groundglass pulmonary nodule of the left pulmonary apex.  This remains nonspecific. #Cardiomyopathy, LVEF35 to 40% Last seen by cardiology 04/26/2019.  Recommend patient to continue follow-up with cardiology.  # GERD  ER visit on 11/13/2019 due to chest pain which started after he ate at a restaurant and developed burning sensation in her chest.  No associated dyspnea. Patient had a CTA which showed no pulmonary embolism.  Patient symptoms got better after GI cocktail.  Was considered to be secondary to acid reflux.  Patient takes omeprazole 20 mg daily.   # 11/03/2019 -02/20/2020 S/p 6 cycles of chemotherapy with carboplatin and Taxol. Postchemotherapy CT scan showed excellent treatment response.   I discussed with Dr.Secord, according to data from GOG 258,  there were no differences in relapse-free survival fewer lower vaginal recurrences and fewer lower pelvic and para-aortic relapses with the addition of RT    INTERVAL HISTORY Sandra Krakeris a 71y.o. female who has above history reviewed by me today presents for follow up visit for management of endometrial cancer. Patient reports feeling well today, except that she continues to have arthritic pain.  She follows up with rheumatology and currently she is on 5 mg of prednisone.  Her rheumatologist has discussed with her about biological options.  She has not decided yet. Patient was seen by gynecology oncology in 10/23/2021 and she  will return to GYN oncology with 32-month 11/11/2021, CT chest abdomen pelvis showed stable examination without new/progressive diseases.  Stable benign small bilateral pulmonary  nodules.  Left-sided colonic diverticulosis without findings of acute radiculitis.  Aortic atherosclerosis.    Review of Systems  Constitutional:  Positive for fatigue. Negative for appetite change, chills and fever.  HENT:   Negative for hearing loss and voice change.   Eyes:  Negative for eye problems.  Respiratory:  Negative for chest tightness and cough.   Cardiovascular:  Negative for chest pain.  Gastrointestinal:  Negative for abdominal distention, abdominal pain and blood in stool.  Endocrine: Negative for hot flashes.  Genitourinary:  Negative for difficulty urinating and frequency.   Musculoskeletal:  Negative for arthralgias.  Skin:  Negative for itching and rash.  Neurological:  Positive for numbness. Negative for extremity weakness.  Hematological:  Negative for adenopathy.  Psychiatric/Behavioral:  Negative for confusion.    MEDICAL HISTORY:  Past Medical History:  Diagnosis Date   Asthma    Asthma    Asthma exacerbation 08/22/2016   [place]    Collagen vascular disease ([place]    Diabetes mellitus without complication ([place]    History of Diabetes    Environmental allergies    Fibromyalgia    High cholesterol    Hypertension    Iron deficiency anemia 07/03/2020   [facility]    Thyroid disease     SURGICAL HISTORY: Past Surgical History:  Procedure Laterality Date   CATARACT EXTRACTION     CHOLECYSTECTOMY     fibroids removed     PORTA CATH REMOVAL N/A 02/14/2021   Procedure: PORTA CATH REMOVAL;  Surgeon: DAlgernon Huxley MD;  [facility];  Service: Cardiovascular;  Laterality: N/A;   THYROIDECTOMY, PARTIAL     tummy tuck      SOCIAL HISTORY: Social History   Socioeconomic History   Marital status: Married    Spouse name: Sandra Fischer    Number of children: Not on file   Years of education: Not on file   Highest education level: Not on file  Occupational History   Occupation: Retired  Tobacco Use   Smoking  status: Former    Packs/day: 0.10    Years: 0.25    Pack years: 0.03    Types: Cigarettes    Quit date: 1980    Years since quitting: 43.0   Smokeless tobacco: Never  Vaping Use   Vaping Use: Never used  Substance and Sexual Activity   Alcohol use: No   Drug use: No   Sexual activity: Yes    Birth control/protection: Post-menopausal  Other Topics Concern   Not on file  Social History Narrative   Not on file   Social Determinants of Health   Financial Resource Strain: Not on file  Food Insecurity: Not on file  Transportation Needs: Not on file  Physical Activity: Not on file  Stress: Not on file  Social Connections: Not on file  Intimate Partner Violence: Not on file    FAMILY HISTORY: Family History  Problem Relation Age of Onset   Diabetes Mother    Hypertension Mother    Hyperlipidemia Mother    Dementia Mother    Breast cancer Neg Hx    Ovarian cancer Neg Hx    Colon cancer Neg Hx     ALLERGIES:  is allergic to abatacept, codeine, iodine, latex, shellfish allergy, trimethoprim hydrochloride [trimethoprim], and penicillins.  MEDICATIONS:  Current  Outpatient Medications  Medication Sig Dispense Refill   Acetaminophen (TYLENOL EXTRA STRENGTH PO) Take 1,300 mg by mouth 2 (two) times daily as needed.     albuterol (ACCUNEB) 1.25 MG/3ML nebulizer solution Take 1 ampule by nebulization every 6 (six) hours as needed for wheezing.     albuterol (PROVENTIL HFA;VENTOLIN HFA) 108 (90 Base) MCG/ACT inhaler Inhale 2 puffs into the lungs every 6 (six) hours as needed for wheezing or shortness of breath. 1 Inhaler 0   ascorbic acid (VITAMIN C) 500 MG tablet Take 500 mg by mouth daily.     aspirin EC 81 MG tablet Take 81 mg by mouth daily.     azelastine (ASTELIN) 0.1 % nasal spray Place 2 sprays into both nostrils 2 (two) times daily. 30 mL 5   Calcium Carb-Cholecalciferol 600-10 MG-MCG TABS Take 1 tablet by mouth 2 (two) times daily.     calcium carbonate (OS-CAL - DOSED IN  MG OF ELEMENTAL CALCIUM) 1250 (500 Ca) MG tablet Take 1 tablet by mouth daily with breakfast.      cetirizine (ZYRTEC) 10 MG tablet Take 10 mg by mouth daily.     Cholecalciferol (D3 ADULT PO) Take 1 capsule by mouth daily.     clobetasol ointment (TEMOVATE) 8.00 % Apply 1 application topically 3 (three) times a week. 60 g 1   clotrimazole (GYNE-LOTRIMIN) 1 % vaginal cream Place 1 Applicatorful vaginally daily.     desloratadine (CLARINEX) 5 MG tablet Take 5 mg by mouth daily.     docusate sodium (COLACE) 50 MG capsule Take 50 mg by mouth 2 (two) times daily.     EPINEPHrine 0.3 mg/0.3 mL IJ SOAJ injection Use as directed for severe allergic reaction. 2 Device 1   FEROSUL 325 (65 Fe) MG tablet TAKE ONE TABLET BY MOUTH TWICE DAILY WITH A MEAL 60 tablet 1   fluticasone (FLONASE) 50 MCG/ACT nasal spray Place 1 spray into both nostrils daily.      fluticasone-salmeterol (ADVAIR) 250-50 MCG/ACT AEPB Inhale 1 puff into the lungs in the morning and at bedtime.     furosemide (LASIX) 10 MG/ML solution Take by mouth daily.     gabapentin (NEURONTIN) 300 MG capsule Take 1 capsule (300 mg total) by mouth 3 (three) times daily. 90 capsule 1   guaiFENesin-dextromethorphan (ROBITUSSIN DM) 100-10 MG/5ML syrup Take 5 mLs by mouth every 4 (four) hours as needed for cough. 118 mL 0   hydrALAZINE (APRESOLINE) 25 MG tablet Take 25 mg by mouth 2 (two) times daily.     indomethacin (INDOCIN) 50 MG capsule Take 50 mg by mouth in the morning, at noon, in the evening, and at bedtime.     ipratropium (ATROVENT) 0.02 % nebulizer solution Take 0.5 mg by nebulization every 4 (four) hours as needed.      ketotifen (ZADITOR) 0.025 % ophthalmic solution Place 1 drop into both eyes 2 (two) times daily as needed.     leflunomide (ARAVA) 20 MG tablet Take 20 mg by mouth daily.     leflunomide (ARAVA) 20 MG tablet Take 1 tablet by mouth daily.     levothyroxine (SYNTHROID, LEVOTHROID) 88 MCG tablet Take 88 mcg by mouth daily before  breakfast.     lidocaine-prilocaine (EMLA) cream Apply to affected area once 30 g 3   lisinopril (PRINIVIL,ZESTRIL) 40 MG tablet Take 20 mg by mouth in the morning and at bedtime.      metoprolol succinate (TOPROL-XL) 50 MG 24 hr tablet Take 50 mg  by mouth daily. Take with or immediately following a meal.     mometasone-formoterol (DULERA) 200-5 MCG/ACT AERO INHALE 2 PUFFS TWO TIMES A DAY FOR BREATHING (REPLACES FLUTICASONE/SALMETEROL)     montelukast (SINGULAIR) 10 MG tablet Take 10 mg by mouth at bedtime.     olopatadine (PATANOL) 0.1 % ophthalmic solution Place 1 drop into both eyes 2 (two) times daily. 5 mL 5   omeprazole (PRILOSEC) 10 MG capsule Take 20 mg by mouth daily.     pravastatin (PRAVACHOL) 40 MG tablet Take 40 mg by mouth daily.     predniSONE (DELTASONE) 5 MG tablet Take 1 tablet by mouth daily.     Prenatal Vit-Fe Fumarate-FA (MULTIVITAMIN-PRENATAL) 27-0.8 MG TABS tablet Take 1 tablet by mouth daily.      prochlorperazine (COMPAZINE) 10 MG tablet Take 1 tablet (10 mg total) by mouth every 6 (six) hours as needed (Nausea or vomiting). 30 tablet 1   senna-docusate (SENOKOT-S) 8.6-50 MG tablet Take 1 tablet by mouth at bedtime as needed for mild constipation.     tiotropium (SPIRIVA) 18 MCG inhalation capsule Place 18 mcg into inhaler and inhale daily.     traMADol (ULTRAM) 50 MG tablet Take 100 mg by mouth as needed (for rheumatoid arthritis).     verapamil (CALAN) 120 MG tablet Take 240 mg by mouth 2 (two) times daily.     nitroGLYCERIN (NITROSTAT) 0.4 MG SL tablet Place under the tongue. Place 1 tablet (0.4 mg total) under the tongue every 5 (five) minutes as needed for Chest pain May take up to 3 doses.     No current facility-administered medications for this visit.     PHYSICAL EXAMINATION: ECOG PERFORMANCE STATUS: 1 - Symptomatic but completely ambulatory  Physical Exam Constitutional:      General: She is not in acute distress.    Appearance: She is obese.  HENT:      Head: Normocephalic and atraumatic.  Eyes:     General: No scleral icterus. Cardiovascular:     Rate and Rhythm: Normal rate and regular rhythm.     Heart sounds: Normal heart sounds.  Pulmonary:     Effort: Pulmonary effort is normal. No respiratory distress.     Breath sounds: No wheezing.  Abdominal:     General: Bowel sounds are normal. There is no distension.     Palpations: Abdomen is soft.  Musculoskeletal:        General: Deformity present. Normal range of motion.     Cervical back: Normal range of motion and neck supple.  Skin:    General: Skin is warm and dry.     Findings: No erythema or rash.  Neurological:     Mental Status: She is alert and oriented to person, place, and time. Mental status is at baseline.     Cranial Nerves: No cranial nerve deficit.     Coordination: Coordination normal.  Psychiatric:        Mood and Affect: Mood normal.    LABORATORY DATA:  I have reviewed the data as listed Lab Results  Component Value Date   WBC 8.4 01/13/2022   HGB 11.5 (L) 01/13/2022   HCT 37.4 01/13/2022   MCV 85.0 01/13/2022   PLT 268 01/13/2022   Recent Labs    07/08/21 1314 10/23/21 1504 12/06/21 1327 01/13/22 1331  NA 136  --  137 137  K 3.9  --  3.4* 3.7  CL 102  --  105 101  CO2 25  --  24 26  GLUCOSE 111*  --  124* 131*  BUN 16  --  14 17  CREATININE 0.70 0.84 0.87 0.76  CALCIUM 9.1  --  9.6 8.9  GFRNONAA >60 >60 >60 >60  PROT 8.2*  --   --  7.5  ALBUMIN 3.6  --   --  3.6  AST 22  --   --  19  ALT 20  --   --  16  ALKPHOS 84  --   --  79  BILITOT 0.4  --   --  0.2*    Iron/TIBC/Ferritin/ %Sat    Component Value Date/Time   IRON 43 01/02/2021 1331   TIBC 200 (L) 01/02/2021 1331   FERRITIN 759 (H) 01/02/2021 1331   IRONPCTSAT 22 01/02/2021 1331      RADIOGRAPHIC STUDIES: I have personally reviewed the radiological images as listed and agreed with the findings in the report., DG Chest 2 View  Result Date: 12/06/2021 CLINICAL DATA:   71 year old female with history of chest pain. EXAM: CHEST - 2 VIEW COMPARISON:  Chest x-ray 11/13/2019. FINDINGS: Lung volumes are normal. No consolidative airspace disease. No pleural effusions. No pneumothorax. No pulmonary nodule or mass noted. Pulmonary vasculature and the cardiomediastinal silhouette are within normal limits. Atherosclerotic calcifications are noted in the thoracic aorta. IMPRESSION: 1.  No radiographic evidence of acute cardiopulmonary disease. 2. Aortic atherosclerosis. Electronically Signed   By: Vinnie Langton M.D.   On: 12/06/2021 14:16   CT CHEST ABDOMEN PELVIS W CONTRAST  Result Date: 11/12/2021 CLINICAL DATA:  History of endometrial cancer, status post total hysterectomy and chemotherapy. Follow-up/restaging EXAM: CT CHEST, ABDOMEN, AND PELVIS WITH CONTRAST TECHNIQUE: Multidetector CT imaging of the chest, abdomen and pelvis was performed following the standard protocol during bolus administration of intravenous contrast. CONTRAST:  143m OMNIPAQUE IOHEXOL 350 MG/ML SOLN COMPARISON:  Multiple priors including CT February 13, 2021 in March 12, 2020 FINDINGS: CT CHEST FINDINGS Cardiovascular: Aortic and branch vessel atherosclerosis without thoracic aortic aneurysm. No central pulmonary embolus on this nondedicated study. Normal size heart. No significant pericardial effusion/thickening. Mediastinum/Nodes: No supraclavicular adenopathy. No pathologically enlarged mediastinal, hilar or axillary lymph nodes. The trachea and esophagus are grossly unremarkable. Lungs/Pleura: Stable left apical sub solid pulmonary nodule measuring 3 mm on image 28/3. Stable 3 mm right lower lobe pulmonary nodule on image 79/3. Right upper lobe calcified granuloma on image 56/3. No new suspicious pulmonary nodules or masses. No pleural effusion. No pneumothorax. Musculoskeletal: Nodular scarring in the anterior right chest wall later prior Port-A-Cath. Multilevel degenerative changes spine. No  aggressive lytic or blastic lesion of bone. CT ABDOMEN PELVIS FINDINGS Hepatobiliary: No suspicious hepatic lesion. Gallbladder surgically absent. No biliary ductal dilation. Pancreas: No pancreatic ductal dilation or evidence of acute inflammation. Spleen: Normal in size without focal abnormality. Adrenals/Urinary Tract: Bilateral adrenal glands are unremarkable. No hydronephrosis. Urinary bladder is unremarkable for degree of distension. Stomach/Bowel: Radiopaque enteric contrast traverses the rectum. Stomach is distended without abnormal wall thickening. No pathologic dilation of small or large bowel. The appendix and terminal ileum appear normal. Left-sided colonic diverticulosis without findings of acute diverticulitis. Vascular/Lymphatic: No abdominal aortic aneurysm. No pathologically enlarged abdominal or pelvic lymph nodes. Previously indexed periaortic lymph node in 4 mm in precaval lymph node is now too small to accurately characterize previously measuring respectively 3 mm in short axis . Reproductive: Status post hysterectomy. No enhancing soft tissue nodularity along the vaginal cuff. No adnexal masses. Other: No abdominopelvic free fluid. No discrete  peritoneal or omental nodularity. Fat containing paraumbilical hernia. Musculoskeletal: No aggressive lytic or blastic lesion of bone. L5 vertebral body bone island. Degenerative changes bilateral hips. Mild multilevel degenerative changes spine. IMPRESSION: 1. Stable examination without new or progressive findings in the chest abdomen or pelvis. 2. Stable benign small pulmonary nodules. 3. Left-sided colonic diverticulosis without findings of acute diverticulitis. 4. Aortic Atherosclerosis (ICD10-I70.0). Electronically Signed   By: Dahlia Bailiff M.D.   On: 11/12/2021 09:59      ASSESSMENT & PLAN:  1. Endometrial cancer ([place])   2. Rheumatoid arthritis, involving unspecified site, unspecified whether rheumatoid factor present (HCC)   3. Lung nodule    4. Anemia, unspecified type   5. Endometrial ca [facility])    #Recurrent endometrial cancer Status post 6 cycles of chemotherapy with carboplatin and Taxol. Post chemotherapy CT scan showed excellent response and resolution of lymphadenopathy. November 2022, CT scan showed no disease progression or recurrence. Labs reviewed and discussed with patient.  CA125 has been stable. Continue alternate follow-up with GYN oncology and [place] every 6 months.  #Anemia of chronic inflammation , iron deficiency anemia, previously status post IV Venofer treatments Iron has improved and the ferritin is currently elevated.  Hemochromatosis screening testing was negative.  Elevated ferritin is likely secondary to chronic inflammation status.   Hemoglobin stable at 11.5.  Monitor.  #Rheumatoid arthritis, follow-up rheumatologist.    #pulmonary nodule, stable lung nodules   Return of visit: 6 months. Earlie Server, MD, PhD  01/15/2022

## 2022-03-13 ENCOUNTER — Encounter: Payer: Self-pay | Admitting: *Deleted

## 2022-03-14 ENCOUNTER — Ambulatory Visit: Payer: No Typology Code available for payment source | Admitting: Anesthesiology

## 2022-03-14 ENCOUNTER — Ambulatory Visit
Admission: RE | Admit: 2022-03-14 | Discharge: 2022-03-14 | Disposition: A | Discharge status: Home / Self Care | Payer: No Typology Code available for payment source | Attending: Gastroenterology | Admitting: Gastroenterology

## 2022-03-14 ENCOUNTER — Encounter: Payer: Self-pay | Admitting: *Deleted

## 2022-03-14 ENCOUNTER — Encounter
Admission: RE | Disposition: A | Discharge status: Home / Self Care | Payer: Self-pay | Source: Home / Self Care | Attending: Gastroenterology

## 2022-03-14 DIAGNOSIS — Z7984 Long term (current) use of oral hypoglycemic drugs: Secondary | ICD-10-CM | POA: Diagnosis not present

## 2022-03-14 DIAGNOSIS — Z90710 Acquired absence of both cervix and uterus: Secondary | ICD-10-CM | POA: Diagnosis not present

## 2022-03-14 DIAGNOSIS — D120 Benign neoplasm of cecum: Secondary | ICD-10-CM | POA: Diagnosis not present

## 2022-03-14 DIAGNOSIS — D123 Benign neoplasm of transverse colon: Secondary | ICD-10-CM | POA: Insufficient documentation

## 2022-03-14 DIAGNOSIS — K219 Gastro-esophageal reflux disease without esophagitis: Secondary | ICD-10-CM | POA: Insufficient documentation

## 2022-03-14 DIAGNOSIS — Z1211 Encounter for screening for malignant neoplasm of colon: Secondary | ICD-10-CM | POA: Insufficient documentation

## 2022-03-14 DIAGNOSIS — G4730 Sleep apnea, unspecified: Secondary | ICD-10-CM | POA: Insufficient documentation

## 2022-03-14 DIAGNOSIS — Z9049 Acquired absence of other specified parts of digestive tract: Secondary | ICD-10-CM | POA: Insufficient documentation

## 2022-03-14 DIAGNOSIS — E890 Postprocedural hypothyroidism: Secondary | ICD-10-CM | POA: Insufficient documentation

## 2022-03-14 DIAGNOSIS — Z6836 Body mass index (BMI) 36.0-36.9, adult: Secondary | ICD-10-CM | POA: Insufficient documentation

## 2022-03-14 DIAGNOSIS — I509 Heart failure, unspecified: Secondary | ICD-10-CM | POA: Insufficient documentation

## 2022-03-14 DIAGNOSIS — I110 Hypertensive heart disease with heart failure: Secondary | ICD-10-CM | POA: Diagnosis not present

## 2022-03-14 DIAGNOSIS — K640 First degree hemorrhoids: Secondary | ICD-10-CM | POA: Insufficient documentation

## 2022-03-14 DIAGNOSIS — J45909 Unspecified asthma, uncomplicated: Secondary | ICD-10-CM | POA: Diagnosis not present

## 2022-03-14 DIAGNOSIS — M797 Fibromyalgia: Secondary | ICD-10-CM | POA: Insufficient documentation

## 2022-03-14 DIAGNOSIS — Z87891 Personal history of nicotine dependence: Secondary | ICD-10-CM | POA: Insufficient documentation

## 2022-03-14 DIAGNOSIS — E7800 Pure hypercholesterolemia, unspecified: Secondary | ICD-10-CM | POA: Insufficient documentation

## 2022-03-14 DIAGNOSIS — E669 Obesity, unspecified: Secondary | ICD-10-CM | POA: Insufficient documentation

## 2022-03-14 DIAGNOSIS — E1140 Type 2 diabetes mellitus with diabetic neuropathy, unspecified: Secondary | ICD-10-CM | POA: Diagnosis not present

## 2022-03-14 HISTORY — DX: Dyspnea, unspecified: R06.00

## 2022-03-14 HISTORY — DX: Hypothyroidism, unspecified: E03.9

## 2022-03-14 HISTORY — DX: Sleep apnea, unspecified: G47.30

## 2022-03-14 HISTORY — DX: Polyneuropathy, unspecified: G62.9

## 2022-03-14 HISTORY — PX: COLONOSCOPY WITH PROPOFOL: SHX5780

## 2022-03-14 LAB — GLUCOSE, CAPILLARY: Glucose-Capillary: 121 mg/dL — ABNORMAL HIGH (ref 70–99)

## 2022-03-14 SURGERY — COLONOSCOPY WITH PROPOFOL
Anesthesia: General

## 2022-03-14 MED ORDER — PROPOFOL 500 MG/50ML IV EMUL
INTRAVENOUS | Status: DC | PRN
  Administered 2022-03-14: 150 ug/kg/min via INTRAVENOUS

## 2022-03-14 MED ORDER — SODIUM CHLORIDE 0.9 % IV SOLN: INTRAVENOUS | Status: DC

## 2022-03-14 MED ORDER — PROPOFOL 500 MG/50ML IV EMUL
INTRAVENOUS | Status: AC
  Filled 2022-03-14: qty 50

## 2022-03-14 NOTE — Anesthesia Preprocedure Evaluation (Addendum)
Anesthesia Evaluation  ?Patient identified by MRN, date of birth, ID band ?Patient awake ? ? ? ?Reviewed: ?Allergy & Precautions, NPO status , Patient's Chart, lab work & pertinent test results ? ?Airway ?Mallampati: III ? ?TM Distance: >3 FB ?Neck ROM: full ? ? ? Dental ? ?(+) Missing, Poor Dentition ?  ?Pulmonary ?asthma , sleep apnea , former smoker,  ?  ?Pulmonary exam normal ? ? ? ? ? ? ? Cardiovascular ?hypertension, Pt. on medications ?+ angina (stable, pt reports episodes apx every 4 weeks with no clear association. Occurs at rest and "certain foods") +CHF and + DOE  ?+ dysrhythmias (h/o supraventricular tachycardia) Supra Ventricular Tachycardia  ?Rhythm:Regular Rate:Normal ?+ Peripheral Edema ?ECHO 2019: ?- Left ventricle: The cavity size was normal. Wall thickness was  ???increased in a pattern of mild LVH. Systolic function was  ???moderately reduced. The estimated ejection fraction was in the  ???range of 35% to 40%. Diffuse hypokinesis. Regional wall motion  ???abnormalities cannot be excluded. The study is not technically  ???sufficient to allow evaluation of LV diastolic function.  ?- Left atrium: The atrium was mildly dilated.  ?- Right ventricle: The cavity size was normal. Wall thickness was  ???normal. Systolic function was normal. ? ?MPS 2020: ?Regional wall motion: ?reveals normal myocardial thickening and wall  ?motion.  ?The overall quality of the study is good. ?  ?Artifacts noted: no  ?Left ventricular cavity: normal ? ?Follows with cardiology: Previous note reviewed ? ?  ?Neuro/Psych ?Neuropathy due to chemotherapeutic drug  ?negative psych ROS  ? GI/Hepatic ?Neg liver ROS, GERD  Controlled and Medicated,  ?Endo/Other  ?diabetes, Type 2, Oral Hypoglycemic AgentsHypothyroidism  ? Renal/GU ?negative Renal ROS  ?negative genitourinary ?  ?Musculoskeletal ? ?(+) Arthritis , Rheumatoid disorders,  Fibromyalgia - ? Abdominal ?(+) + obese,   ?Peds ?  Hematology ? ?(+) Blood dyscrasia (Iron deficiency anemia), anemia ,   ?Anesthesia Other Findings ?Past Medical History: ?No date: Asthma ?No date: Asthma ?08/22/2016: Asthma exacerbation ?No date: Cancer [facility]) ?No date: Collagen vascular disease ([place]) ?No date: Diabetes mellitus without complication ([place]) ?    Comment:  History of Diabetes  ?No date: Environmental allergies ?No date: Fibromyalgia ?No date: High cholesterol ?No date: Hypertension ?No date: Hypothyroidism ?07/03/2020: Iron deficiency anemia ?No date: Neuropathy ?No date: RA (rheumatoid arthritis) ([place]) ?No date: Thyroid disease ? ?Past Surgical History: ?No date: ABDOMINAL HYSTERECTOMY ?No date: CATARACT EXTRACTION ?No date: CHOLECYSTECTOMY ?No date: CYSTOURETHROSCOPY ?No date: DIAGNOSTIC LAPAROSCOPY ?No date: EYE SURGERY ?No date: fibroids removed ?02/14/2021: PORTA CATH REMOVAL; N/A ?    Comment:  Procedure: PORTA CATH REMOVAL;  Surgeon: Algernon Huxley,  ?             MD;  Location: [place] [facility];  Service:  ?             Cardiovascular;  Laterality: N/A; ?No date: THYROIDECTOMY, PARTIAL ?No date: tummy tuck ? ? ? ? Reproductive/Obstetrics ?negative OB ROS ? ?  ? ? ? ? ? ? ? ? ? ? ? ? ? ?  ?  ? ? ? ? ? ? ? ?Anesthesia Physical ?Anesthesia Plan ? ?ASA: 3 ? ?Anesthesia Plan: General  ? ?Post-op Pain Management:   ? ?Induction:  ? ?PONV Risk Score and Plan: Propofol infusion and TIVA ? ?Airway Management Planned: Natural Airway and Nasal CPAP ? ?Additional Equipment:  ? ?Intra-op Plan:  ? ?Post-operative Plan:  ? ?Informed Consent: I have reviewed the patients History and Physical,  chart, labs and discussed the procedure including the risks, benefits and alternatives for the proposed anesthesia with the patient or authorized representative who has indicated his/her understanding and acceptance.  ? ? ? ?Dental advisory given ? ?Plan Discussed with: Anesthesiologist, CRNA and Surgeon ? ?Anesthesia Plan Comments:   ? ? ? ? ? ?Anesthesia Quick  Evaluation ? ?

## 2022-03-14 NOTE — Anesthesia Postprocedure Evaluation (Signed)
Anesthesia Post Note ? ?Patient: Sandra Fischer ? ?Procedure(s) Performed: COLONOSCOPY WITH PROPOFOL ? ?Patient location during evaluation: Endoscopy ?Anesthesia Type: General ?Level of consciousness: awake and alert ?Pain management: pain level controlled ?Vital Signs Assessment: post-procedure vital signs reviewed and stable ?Respiratory status: spontaneous breathing, nonlabored ventilation and respiratory function stable ?Cardiovascular status: blood pressure returned to baseline and stable ?Postop Assessment: no apparent nausea or vomiting ?Anesthetic complications: no ? ? ?No notable events documented. ? ? ?Last Vitals:  ?Vitals:  ? 03/14/22 0820 03/14/22 0830  ?BP: (!) 145/85 139/80  ?Pulse: 93 92  ?Resp: (!) 22 19  ?Temp: (!) 35.8 ?C   ?SpO2: 98% 98%  ?  ?Last Pain:  ?Vitals:  ? 03/14/22 0830  ?TempSrc:   ?PainSc: 0-No pain  ? ? ?  ?  ?  ?  ?  ?  ? ?[place] ? ? ? ? ?

## 2022-03-14 NOTE — Interval H&P Note (Signed)
History and Physical Interval Note: ? ?03/14/2022 ?7:46 AM ? ?Sandra Fischer  has presented today for surgery, with the diagnosis of PH Colonic Polyps.  The various methods of treatment have been discussed with the patient and family. After consideration of risks, benefits and other options for treatment, the patient has consented to  Procedure(s) with comments: ?COLONOSCOPY WITH PROPOFOL (N/A) - DM as a surgical intervention.  The patient's history has been reviewed, patient examined, no change in status, stable for surgery.  I have reviewed the patient's chart and labs.  Questions were answered to the patient's satisfaction.   ? ? ?Sandra Fischer ? ?Ok to proceed with colonoscopy ?

## 2022-03-14 NOTE — Op Note (Signed)
[facility] ?Gastroenterology ?Patient Name: Sandra Fischer ?Procedure Date: 03/14/2022 6:49 AM ?MRN: [id number] ?Account #: [id number] ?Date of Birth: 1951/10/08 ?Admit Type: Outpatient ?Age: 71 ?Room: [facility] ENDO ROOM 3 ?Gender: Female ?Note Status: Finalized ?Instrument Name: Colonoscope [id number] ?Procedure:             Colonoscopy ?Indications:           Surveillance: Personal history of colonic polyps  ?                       (unknown histology) on last colonoscopy more than 5  ?                       years ago ?Providers:             Andrey Farmer MD, MD ?Referring MD:          Felipa Eth (Referring MD) ?Medicines:             Monitored Anesthesia Care ?Complications:         No immediate complications. Estimated blood loss:  ?                       Minimal. ?Procedure:             Pre-Anesthesia Assessment: ?                       - Prior to the procedure, a History and Physical was  ?                       performed, and patient medications and allergies were  ?                       reviewed. The patient is competent. The risks and  ?                       benefits of the procedure and the sedation options and  ?                       risks were discussed with the patient. All questions  ?                       were answered and informed consent was obtained.  ?                       Patient identification and proposed procedure were  ?                       verified by the physician, the nurse, the  ?                       anesthesiologist, the anesthetist and the technician  ?                       in the endoscopy suite. Mental Status Examination:  ?                       alert and oriented. Airway Examination: normal  ?  oropharyngeal airway and neck mobility. Respiratory  ?                       Examination: clear to auscultation. CV Examination:  ?                       normal. Prophylactic Antibiotics: The patient does not  ?                       require  prophylactic antibiotics. Prior  ?                       Anticoagulants: The patient has taken no previous  ?                       anticoagulant or antiplatelet agents. ASA Grade  ?                       Assessment: II - A patient with mild systemic disease.  ?                       After reviewing the risks and benefits, the patient  ?                       was deemed in satisfactory condition to undergo the  ?                       procedure. The anesthesia plan was to use monitored  ?                       anesthesia care (MAC). Immediately prior to  ?                       administration of medications, the patient was  ?                       re-assessed for adequacy to receive sedatives. The  ?                       heart rate, respiratory rate, oxygen saturations,  ?                       blood pressure, adequacy of pulmonary ventilation, and  ?                       response to care were monitored throughout the  ?                       procedure. The physical status of the patient was  ?                       re-assessed after the procedure. ?                       After obtaining informed consent, the colonoscope was  ?                       passed under direct vision. Throughout the procedure,  ?  the patient's blood pressure, pulse, and oxygen  ?                       saturations were monitored continuously. The  ?                       Colonoscope was introduced through the anus and  ?                       advanced to the the cecum, identified by appendiceal  ?                       orifice and ileocecal valve. The colonoscopy was  ?                       performed without difficulty. The patient tolerated  ?                       the procedure well. The quality of the bowel  ?                       preparation was good. ?Findings: ?     The perianal and digital rectal examinations were normal. ?     A 1 mm polyp was found in the cecum. The polyp was sessile. The polyp  ?     was  removed with a jumbo cold forceps. Resection and retrieval were  ?     complete. Estimated blood loss was minimal. ?     Two sessile polyps were found in the hepatic flexure. The polyps were 1  ?     to 2 mm in size. These polyps were removed with a jumbo cold forceps.  ?     Resection and retrieval were complete. Estimated blood loss was minimal. ?     Internal hemorrhoids were found during retroflexion. The hemorrhoids  ?     were Grade I (internal hemorrhoids that do not prolapse). ?     The exam was otherwise without abnormality on direct and retroflexion  ?     views. ?Impression:            - One 1 mm polyp in the cecum, removed with a jumbo  ?                       cold forceps. Resected and retrieved. ?                       - Two 1 to 2 mm polyps at the hepatic flexure, removed  ?                       with a jumbo cold forceps. Resected and retrieved. ?                       - Internal hemorrhoids. ?                       - The examination was otherwise normal on direct and  ?                       retroflexion views. ?Recommendation:        -  Discharge patient to home. ?                       - Resume previous diet. ?                       - Continue present medications. ?                       - Await pathology results. ?                       - Repeat colonoscopy for surveillance based on  ?                       pathology results. ?                       - Return to referring physician as previously  ?                       scheduled. ?Procedure Code(s):     --- Professional --- ?                       [id number], Colonoscopy, flexible; with biopsy, single or  ?                       multiple ?Diagnosis Code(s):     --- Professional --- ?                       K63.5, Polyp of colon ?                       Z86.010, Personal history of colonic polyps ?                       K64.0, First degree hemorrhoids ?CPT copyright 2019 [employer]. All rights reserved. ?The codes documented in this  report are preliminary and upon coder review may  ?be revised to meet current compliance requirements. ?Andrey Farmer MD, MD ?03/14/2022 8:12:59 AM ?Number of Addenda: 0 ?Note Initiated On: 03/14/2022 6:49 AM ?Scope Withdrawal Time: 0 hours 8 minutes 9 seconds  ?Total Procedure Duration: 0 hours 13 minutes 41 seconds  ?Estimated Blood Loss:  Estimated blood loss was minimal. ?     [facility] ?

## 2022-03-14 NOTE — H&P (Signed)
Outpatient short stay form Pre-procedure ?03/14/2022  ?Lesly Rubenstein, MD ? ?Primary Physician: Felipa Eth, MD ? ?Reason for visit:  Surveillance colonoscopy ? ?History of present illness:   ? ?71 y/o lady with history of endometrial cancer s/p hysterectomy, hypothyrodisim, and hypertension here for colonoscopy for history of polyps of unknown type. Last colonoscopy was in 2017. No blood thinners. No family history of GI malignancies. Also with history of cholecystectomy and laprascopic surgery for fibroid. ? ? ? ?Current Facility-Administered Medications:  ?  0.9 %  sodium chloride infusion, , Intravenous, Continuous, [place], Hilton Cork, MD, Last Rate: 20 mL/hr at 03/14/22 0731, New Bag at 03/14/22 0731 ? ?Medications Prior to Admission  ?Medication Sig Dispense Refill Last Dose  ? hydrALAZINE (APRESOLINE) 25 MG tablet Take 25 mg by mouth 2 (two) times daily.   03/14/2022  ? levothyroxine (SYNTHROID, LEVOTHROID) 88 MCG tablet Take 88 mcg by mouth daily before breakfast.   03/13/2022  ? lisinopril (PRINIVIL,ZESTRIL) 40 MG tablet Take 20 mg by mouth in the morning and at bedtime.    03/14/2022  ? metoprolol succinate (TOPROL-XL) 50 MG 24 hr tablet Take 50 mg by mouth daily. Take with or immediately following a meal.   03/13/2022  ? nortriptyline (PAMELOR) 25 MG capsule Take 25 mg by mouth at bedtime.     ? pravastatin (PRAVACHOL) 40 MG tablet Take 40 mg by mouth daily.   03/14/2022  ? valACYclovir (VALTREX) 1000 MG tablet Take 1,000 mg by mouth 2 (two) times daily.     ? verapamil (CALAN) 120 MG tablet Take 240 mg by mouth 2 (two) times daily.   03/14/2022  ? Acetaminophen (TYLENOL EXTRA STRENGTH PO) Take 1,300 mg by mouth 2 (two) times daily as needed.     ? albuterol (ACCUNEB) 1.25 MG/3ML nebulizer solution Take 1 ampule by nebulization every 6 (six) hours as needed for wheezing.     ? albuterol (PROVENTIL HFA;VENTOLIN HFA) 108 (90 Base) MCG/ACT inhaler Inhale 2 puffs into the lungs every 6 (six) hours as  needed for wheezing or shortness of breath. 1 Inhaler 0   ? ascorbic acid (VITAMIN C) 500 MG tablet Take 500 mg by mouth daily.     ? aspirin EC 81 MG tablet Take 81 mg by mouth daily.     ? azelastine (ASTELIN) 0.1 % nasal spray Place 2 sprays into both nostrils 2 (two) times daily. 30 mL 5   ? Calcium Carb-Cholecalciferol 600-10 MG-MCG TABS Take 1 tablet by mouth 2 (two) times daily.     ? calcium carbonate (OS-CAL - DOSED IN MG OF ELEMENTAL CALCIUM) 1250 (500 Ca) MG tablet Take 1 tablet by mouth daily with breakfast.      ? cetirizine (ZYRTEC) 10 MG tablet Take 10 mg by mouth daily.     ? Cholecalciferol (D3 ADULT PO) Take 1 capsule by mouth daily.     ? clobetasol ointment (TEMOVATE) 7.03 % Apply 1 application topically 3 (three) times a week. 60 g 1   ? clotrimazole (GYNE-LOTRIMIN) 1 % vaginal cream Place 1 Applicatorful vaginally daily.     ? desloratadine (CLARINEX) 5 MG tablet Take 5 mg by mouth daily.     ? docusate sodium (COLACE) 50 MG capsule Take 50 mg by mouth 2 (two) times daily.     ? EPINEPHrine 0.3 mg/0.3 mL IJ SOAJ injection Use as directed for severe allergic reaction. 2 Device 1   ? FEROSUL 325 (65 Fe) MG tablet TAKE ONE TABLET BY  MOUTH TWICE DAILY WITH A MEAL 60 tablet 1   ? fluticasone (FLONASE) 50 MCG/ACT nasal spray Place 1 spray into both nostrils daily.      ? fluticasone-salmeterol (ADVAIR) 250-50 MCG/ACT AEPB Inhale 1 puff into the lungs in the morning and at bedtime.     ? furosemide (LASIX) 10 MG/ML solution Take by mouth daily. (Patient not taking: Reported on 03/14/2022)   Not Taking  ? gabapentin (NEURONTIN) 300 MG capsule Take 1 capsule (300 mg total) by mouth 3 (three) times daily. 90 capsule 1   ? guaiFENesin-dextromethorphan (ROBITUSSIN DM) 100-10 MG/5ML syrup Take 5 mLs by mouth every 4 (four) hours as needed for cough. 118 mL 0   ? indomethacin (INDOCIN) 50 MG capsule Take 50 mg by mouth in the morning, at noon, in the evening, and at bedtime.     ? ipratropium (ATROVENT) 0.02  % nebulizer solution Take 0.5 mg by nebulization every 4 (four) hours as needed.      ? ketotifen (ZADITOR) 0.025 % ophthalmic solution Place 1 drop into both eyes 2 (two) times daily as needed.     ? leflunomide (ARAVA) 20 MG tablet Take 20 mg by mouth daily.     ? leflunomide (ARAVA) 20 MG tablet Take 1 tablet by mouth daily.     ? lidocaine-prilocaine (EMLA) cream Apply to affected area once 30 g 3   ? mometasone-formoterol (DULERA) 200-5 MCG/ACT AERO INHALE 2 PUFFS TWO TIMES A DAY FOR BREATHING (REPLACES FLUTICASONE/SALMETEROL)     ? montelukast (SINGULAIR) 10 MG tablet Take 10 mg by mouth at bedtime.     ? nitroGLYCERIN (NITROSTAT) 0.4 MG SL tablet Place under the tongue. Place 1 tablet (0.4 mg total) under the tongue every 5 (five) minutes as needed for Chest pain May take up to 3 doses.     ? olopatadine (PATANOL) 0.1 % ophthalmic solution Place 1 drop into both eyes 2 (two) times daily. 5 mL 5   ? omeprazole (PRILOSEC) 10 MG capsule Take 20 mg by mouth daily.     ? predniSONE (DELTASONE) 5 MG tablet Take 1 tablet by mouth daily.     ? Prenatal Vit-Fe Fumarate-FA (MULTIVITAMIN-PRENATAL) 27-0.8 MG TABS tablet Take 1 tablet by mouth daily.      ? prochlorperazine (COMPAZINE) 10 MG tablet Take 1 tablet (10 mg total) by mouth every 6 (six) hours as needed (Nausea or vomiting). 30 tablet 1   ? senna-docusate (SENOKOT-S) 8.6-50 MG tablet Take 1 tablet by mouth at bedtime as needed for mild constipation.     ? tiotropium (SPIRIVA) 18 MCG inhalation capsule Place 18 mcg into inhaler and inhale daily.     ? traMADol (ULTRAM) 50 MG tablet Take 100 mg by mouth as needed (for rheumatoid arthritis).     ? ? ? ?Allergies  ?Allergen Reactions  ? Abatacept Hives  ? Codeine Hives, Nausea And Vomiting and Nausea Only  ? Iodine Swelling  ? Latex Itching, Hives and Rash  ? Shellfish Allergy Swelling  ? Trimethoprim Hydrochloride [Trimethoprim] Other (See Comments)  ?  Pancreatitis  ? Penicillins Nausea And Vomiting  ? ? ? ?Past  Medical History:  ?Diagnosis Date  ? Asthma   ? Asthma   ? Asthma exacerbation 08/22/2016  ? Cancer [facility])   ? Collagen vascular disease ([place])   ? Diabetes mellitus without complication ([place])   ? History of Diabetes   ? Dyspnea   ? Environmental allergies   ? Fibromyalgia   ?  High cholesterol   ? Hypertension   ? Hypothyroidism   ? Iron deficiency anemia 07/03/2020  ? Neuropathy   ? RA (rheumatoid arthritis) ([place])   ? Sleep apnea   ? Thyroid disease   ? ? ?Review of systems:  Otherwise negative.  ? ? ?Physical Exam ? ?Gen: Alert, oriented. Appears stated age.  ?HEENT: PERRLA. ?Lungs: No respiratory distress ?CV: RRR ?Abd: soft, benign, no masses ?Ext: No edema ? ? ? ?Planned procedures: Proceed with colonoscopy. The patient understands the nature of the planned procedure, indications, risks, alternatives and potential complications including but not limited to bleeding, infection, perforation, damage to internal organs and possible oversedation/side effects from anesthesia. The patient agrees and gives consent to proceed.  ?Please refer to procedure notes for findings, recommendations and patient disposition/instructions.  ? ? ? ?Lesly Rubenstein, MD ?[facility] ? ? ? ?  ? ?

## 2022-03-14 NOTE — Anesthesia Procedure Notes (Signed)
Date/Time: 03/14/2022 7:47 AM ?Performed by: Vaughan Sine ?Pre-anesthesia Checklist: Patient identified, Emergency Drugs available, Suction available, Patient being monitored and Timeout performed ?Patient Re-evaluated:Patient Re-evaluated prior to induction ?Oxygen Delivery Method: Supernova nasal CPAP ?Preoxygenation: Pre-oxygenation with 100% oxygen ?Induction Type: IV induction ?Placement Confirmation: positive ETCO2 and CO2 detector ? ? ? ? ?

## 2022-03-14 NOTE — Transfer of Care (Signed)
Immediate Anesthesia Transfer of Care Note ? ?Patient: Sandra Fischer ? ?Procedure(s) Performed: COLONOSCOPY WITH PROPOFOL ? ?Patient Location: PACU ? ?Anesthesia Type:General ? ?Level of Consciousness: awake and sedated ? ?Airway & Oxygen Therapy: Patient Spontanous Breathing and Patient connected to face mask oxygen ? ?Post-op Assessment: Report given to RN and Post -op Vital signs reviewed and stable ? ?Post vital signs: Reviewed and stable ? ?Last Vitals:  ?Vitals Value Taken Time  ?BP    ?Temp    ?Pulse    ?Resp    ?SpO2    ? ? ?Last Pain:  ?Vitals:  ? 03/14/22 0712  ?TempSrc: Temporal  ?PainSc: 3   ?   ? ?  ? ?Complications: No notable events documented. ?

## 2022-03-17 LAB — SURGICAL PATHOLOGY

## 2022-03-19 ENCOUNTER — Encounter: Payer: Self-pay | Admitting: Oncology

## 2022-03-20 ENCOUNTER — Ambulatory Visit: Payer: Medicare Other | Admitting: Podiatry

## 2022-04-03 ENCOUNTER — Encounter: Payer: Self-pay | Admitting: Oncology

## 2022-04-03 ENCOUNTER — Encounter: Payer: Self-pay | Admitting: Podiatry

## 2022-04-03 ENCOUNTER — Ambulatory Visit (INDEPENDENT_AMBULATORY_CARE_PROVIDER_SITE_OTHER): Payer: Medicare Other | Admitting: Podiatry

## 2022-04-03 DIAGNOSIS — E1142 Type 2 diabetes mellitus with diabetic polyneuropathy: Secondary | ICD-10-CM

## 2022-04-03 DIAGNOSIS — M79674 Pain in right toe(s): Secondary | ICD-10-CM

## 2022-04-03 DIAGNOSIS — G620 Drug-induced polyneuropathy: Secondary | ICD-10-CM

## 2022-04-03 DIAGNOSIS — B351 Tinea unguium: Secondary | ICD-10-CM

## 2022-04-03 DIAGNOSIS — M79675 Pain in left toe(s): Secondary | ICD-10-CM

## 2022-04-03 DIAGNOSIS — T451X5A Adverse effect of antineoplastic and immunosuppressive drugs, initial encounter: Secondary | ICD-10-CM

## 2022-04-03 NOTE — Progress Notes (Signed)
This patient returns to my office for at risk foot care.  This patient requires this care by a professional since this patient will be at risk due to having diet controlled diabetes.   This patient is unable to cut nails herself since the patient cannot reach her nails.These nails are painful walking and wearing shoes.  This patient presents for at risk foot care today.  General Appearance  Alert, conversant and in no acute stress.  Vascular  Dorsalis pedis and posterior tibial  pulses are palpable  bilaterally.  Capillary return is within normal limits  bilaterally. Temperature is within normal limits  bilaterally.  Neurologic  Senn-Weinstein monofilament wire test WNL  bilaterally. Muscle power within normal limits bilaterally.  Nails Thick disfigured discolored nails with subungual debris  from hallux to fifth toes bilaterally. No evidence of bacterial infection or drainage bilaterally.  Orthopedic  No limitations of motion  feet .  No crepitus or effusions noted.  No bony pathology or digital deformities noted.  HAV  B/L.  Skin  normotropic skin with no porokeratosis noted bilaterally.  No signs of infections or ulcers noted.     Onychomycosis  Pain in right toes  Pain in left toes  Diabetes    Consent was obtained for treatment procedures.   Mechanical debridement of nails 1-5  bilaterally performed with a nail nipper.  Filed with dremel without incident.     Return office visit   10 weeks                  Told patient to return for periodic foot care and evaluation due to potential at risk complications.   Vannesa Abair DPM  

## 2022-04-05 ENCOUNTER — Encounter: Payer: Self-pay | Admitting: Emergency Medicine

## 2022-04-05 ENCOUNTER — Emergency Department: Payer: No Typology Code available for payment source

## 2022-04-05 ENCOUNTER — Emergency Department
Admission: EM | Admit: 2022-04-05 | Discharge: 2022-04-05 | Disposition: A | Discharge status: Home / Self Care | Payer: No Typology Code available for payment source | Attending: Emergency Medicine | Admitting: Emergency Medicine

## 2022-04-05 ENCOUNTER — Other Ambulatory Visit: Payer: Self-pay

## 2022-04-05 DIAGNOSIS — W19XXXA Unspecified fall, initial encounter: Secondary | ICD-10-CM | POA: Insufficient documentation

## 2022-04-05 DIAGNOSIS — M79661 Pain in right lower leg: Secondary | ICD-10-CM | POA: Diagnosis not present

## 2022-04-05 DIAGNOSIS — I10 Essential (primary) hypertension: Secondary | ICD-10-CM | POA: Insufficient documentation

## 2022-04-05 DIAGNOSIS — S8001XA Contusion of right knee, initial encounter: Secondary | ICD-10-CM

## 2022-04-05 DIAGNOSIS — R0789 Other chest pain: Secondary | ICD-10-CM | POA: Insufficient documentation

## 2022-04-05 DIAGNOSIS — R0602 Shortness of breath: Secondary | ICD-10-CM | POA: Insufficient documentation

## 2022-04-05 DIAGNOSIS — R079 Chest pain, unspecified: Secondary | ICD-10-CM

## 2022-04-05 DIAGNOSIS — R600 Localized edema: Secondary | ICD-10-CM

## 2022-04-05 DIAGNOSIS — S8991XA Unspecified injury of right lower leg, initial encounter: Secondary | ICD-10-CM | POA: Diagnosis present

## 2022-04-05 LAB — BASIC METABOLIC PANEL
Anion gap: 9 (ref 5–15)
BUN: 11 mg/dL (ref 8–23)
CO2: 26 mmol/L (ref 22–32)
Calcium: 9.1 mg/dL (ref 8.9–10.3)
Chloride: 102 mmol/L (ref 98–111)
Creatinine, Ser: 0.64 mg/dL (ref 0.44–1.00)
GFR, Estimated: >60 mL/min (ref >60)
Glucose, Bld: 108 mg/dL — ABNORMAL HIGH (ref 70–99)
Potassium: 3.1 mmol/L — ABNORMAL LOW (ref 3.5–5.1)
Sodium: 137 mmol/L (ref 135–145)

## 2022-04-05 LAB — CBC
HCT: 38.2 % (ref 36.0–46.0)
Hemoglobin: 11.4 g/dL — ABNORMAL LOW (ref 12.0–15.0)
MCH: 25.4 pg — ABNORMAL LOW (ref 26.0–34.0)
MCHC: 29.8 g/dL — ABNORMAL LOW (ref 30.0–36.0)
MCV: 85.3 fL (ref 80.0–100.0)
Platelets: 258 10*3/uL (ref 150–400)
RBC: 4.48 MIL/uL (ref 3.87–5.11)
RDW: 15.6 % — ABNORMAL HIGH (ref 11.5–15.5)
WBC: 8.2 10*3/uL (ref 4.0–10.5)
nRBC: 0 % (ref 0.0–0.2)

## 2022-04-05 LAB — TROPONIN I (HIGH SENSITIVITY)
Troponin I (High Sensitivity): 6 ng/L (ref <18)
Troponin I (High Sensitivity): 6 ng/L (ref <18)

## 2022-04-05 MED ORDER — MORPHINE SULFATE (PF) 4 MG/ML IV SOLN
4.0000 mg | Freq: Once | INTRAVENOUS | Status: AC
  Administered 2022-04-05: 4 mg via INTRAVENOUS
  Filled 2022-04-05: qty 1

## 2022-04-05 MED ORDER — VALACYCLOVIR HCL 500 MG PO TABS
1000.0000 mg | ORAL_TABLET | ORAL | Status: AC
Start: 2022-04-05 — End: 2022-04-05
  Administered 2022-04-05: 1000 mg via ORAL
  Filled 2022-04-05: qty 2

## 2022-04-05 MED ORDER — IOHEXOL 350 MG/ML SOLN
50.0000 mL | Freq: Once | INTRAVENOUS | Status: AC | PRN
  Administered 2022-04-05: 50 mL via INTRAVENOUS

## 2022-04-05 MED ORDER — ONDANSETRON HCL 4 MG/2ML IJ SOLN
4.0000 mg | Freq: Once | INTRAMUSCULAR | Status: AC
  Administered 2022-04-05: 4 mg via INTRAVENOUS
  Filled 2022-04-05: qty 2

## 2022-04-05 MED ORDER — VALACYCLOVIR HCL 1 G PO TABS: 1000.0000 mg | ORAL_TABLET | Freq: Three times a day (TID) | ORAL | 0 refills | Status: AC

## 2022-04-05 NOTE — ED Triage Notes (Signed)
Pt via POV from home. Pt c/o R sided chest pain that wraps around to her back for the past couple of days and some SOB last night. Pt states that she is also having calf pain and swollen to her R knee. Denies hx of DVT. Denies blood thinner. No discoloration noted. Pt is A&OX4 and NAD ?

## 2022-04-05 NOTE — Discharge Instructions (Signed)
Your lab test and CT scan of the chest were all okay.  Please take valacyclovir as prescribed in case your symptoms are due to shingles. ? ?Your x-ray of the right knee and ultrasound of the right leg are both unremarkable. ?

## 2022-04-05 NOTE — ED Provider Notes (Signed)
? ?[facility] ?Provider Note ? ? ? Event Date/Time  ? First MD Initiated Contact with Patient 04/05/22 1257   ?  (approximate) ? ? ?History  ? ?Chest Pain ? ? ?HPI ? ?Sandra Fischer is a 71 y.o. female with a past history of hypertension, fibromyalgia, rheumatoid arthritis who comes ED complaining of right-sided chest pain that wraps around in a linear distribution for the past 2 days, associated with some shortness of breath.  Hurts to move, hurts to breathe.  No cough.  Not exertional.  Feels like dull ache.  No fever or chills. ? ?Within the last month she had a fall onto her right knee resulting in persistent right knee and right lower leg pain, as well as a colonoscopy.  No history of DVT or PE.  Not on blood thinners. ?  ? ? ?Physical Exam  ? ?Triage Vital Signs: ?ED Triage Vitals  ?Enc Vitals Group  ?   BP 04/05/22 1209 (!) 160/76  ?   Pulse Rate 04/05/22 1209 76  ?   Resp 04/05/22 1209 18  ?   Temp 04/05/22 1209 98.4 ?F (36.9 ?C)  ?   Temp Source 04/05/22 1209 Oral  ?   SpO2 04/05/22 1209 97 %  ?   Weight 04/05/22 1213 200 lb (90.7 kg)  ?   Height 04/05/22 1213 '5\' 2"'$  (1.575 m)  ?   Head Circumference --   ?   Peak Flow --   ?   Pain Score 04/05/22 1213 7  ?   Pain Loc --   ?   Pain Edu? --   ?   Excl. in [place]? --   ? ? ?Most recent vital signs: ?Vitals:  ? 04/05/22 1406 04/05/22 1430  ?BP: (!) 201/114 (!) 171/88  ?Pulse: 98 80  ?Resp: 16 16  ?Temp:    ?SpO2: 98% 97%  ? ? ? ?General: Awake, no distress.  ?CV:  Good peripheral perfusion.  Regular rate and rhythm ?Resp:  Normal effort.  Clear to auscultation bilaterally ?Abd:  No distention.  Soft and nontender ?Other:  There is some tenderness about the right knee, no bony point tenderness.  No deformity.  No significant edema or calf asymmetry, but there is tenderness in the posterior right calf and popliteal fossa.  Chest wall was examined with patient's nurse at bedside, no rash to suggest skin or soft tissue infection, wound, or  zoster. ? ? ?ED Results / Procedures / Treatments  ? ?Labs ?(all labs ordered are listed, but only abnormal results are displayed) ?Labs Reviewed  ?BASIC METABOLIC PANEL - Abnormal; Notable for the following components:  ?    Result Value  ? Potassium 3.1 (*)   ? Glucose, Bld 108 (*)   ? All other components within normal limits  ?CBC - Abnormal; Notable for the following components:  ? Hemoglobin 11.4 (*)   ? MCH 25.4 (*)   ? MCHC 29.8 (*)   ? RDW 15.6 (*)   ? All other components within normal limits  ?TROPONIN I (HIGH SENSITIVITY)  ?TROPONIN I (HIGH SENSITIVITY)  ? ? ? ?EKG ? ?Interpreted by me ?Sinus rhythm rate of 77.  Normal axis and intervals.  Poor R wave progression.  Normal ST segments and T waves.  2 PVCs on the strip. ? ? ? ? ?RADIOLOGY ?Chest x-ray viewed and interpreted by me, appears normal.  Radiology report reviewed ? ?CTA of the chest negative for PE, shows some signs of  mild bronchitis. ? ?PROCEDURES: ? ?Critical Care performed: No ? ?Procedures ? ? ?MEDICATIONS ORDERED IN ED: ?Medications  ?valACYclovir (VALTREX) tablet 1,000 mg (has no administration in time range)  ?morphine (PF) 4 MG/ML injection 4 mg (4 mg Intravenous Given 04/05/22 1443)  ?ondansetron (ZOFRAN) injection 4 mg (4 mg Intravenous Given 04/05/22 1443)  ?iohexol (OMNIPAQUE) 350 MG/ML injection 50 mL (50 mLs Intravenous Contrast Given 04/05/22 1456)  ? ? ? ?IMPRESSION / MDM / ASSESSMENT AND PLAN / ED COURSE  ?I reviewed the triage vital signs and the nursing notes. ?             ?               ? ?Differential diagnosis includes, but is not limited to, pneumonia, pulmonary embolism, pneumothorax, pleural effusion, zoster, right leg DVT, knee fracture, contusion ? ?Patient presents with right knee pain and right chest pain which is atypical.  Doubt ACS dissection or pericarditis.  Labs are unremarkable, x-ray is unremarkable.  Ultrasound of the right leg is negative for DVT.  CT of the chest is negative for PE.  Will treat empirically  with valacyclovir for possible zoster, otherwise supportive care discussed with the patient regarding likely muscular pain. ? ? ?  ? ? ?FINAL CLINICAL IMPRESSION(S) / ED DIAGNOSES  ? ?Final diagnoses:  ?Nonspecific chest pain  ?Contusion of right knee, initial encounter  ? ? ? ?Rx / DC Orders  ? ?ED Discharge Orders   ? ?      Ordered  ?  valACYclovir (VALTREX) 1000 MG tablet  3 times daily       ? 04/05/22 1518  ? ?  ?  ? ?  ? ? ? ?Note:  This document was prepared using Dragon voice recognition software and may include unintentional dictation errors. ?  ?Carrie Mew, MD ?04/05/22 1523 ? ?

## 2022-04-10 ENCOUNTER — Ambulatory Visit: Payer: No Typology Code available for payment source | Admitting: Podiatry

## 2022-04-16 ENCOUNTER — Inpatient Hospital Stay: Payer: Medicare Other

## 2022-04-16 ENCOUNTER — Inpatient Hospital Stay: Payer: Medicare Other | Attending: Obstetrics and Gynecology | Admitting: Obstetrics and Gynecology

## 2022-04-16 VITALS — BP 157/77 | HR 67 | Temp 98.7°F | Resp 18 | Wt 204.7 lb

## 2022-04-16 DIAGNOSIS — N898 Other specified noninflammatory disorders of vagina: Secondary | ICD-10-CM | POA: Diagnosis not present

## 2022-04-16 DIAGNOSIS — R911 Solitary pulmonary nodule: Secondary | ICD-10-CM | POA: Insufficient documentation

## 2022-04-16 DIAGNOSIS — M069 Rheumatoid arthritis, unspecified: Secondary | ICD-10-CM | POA: Diagnosis not present

## 2022-04-16 DIAGNOSIS — Z9221 Personal history of antineoplastic chemotherapy: Secondary | ICD-10-CM | POA: Diagnosis not present

## 2022-04-16 DIAGNOSIS — C541 Malignant neoplasm of endometrium: Secondary | ICD-10-CM | POA: Diagnosis present

## 2022-04-16 DIAGNOSIS — Z6837 Body mass index (BMI) 37.0-37.9, adult: Secondary | ICD-10-CM | POA: Insufficient documentation

## 2022-04-16 DIAGNOSIS — E669 Obesity, unspecified: Secondary | ICD-10-CM | POA: Insufficient documentation

## 2022-04-16 DIAGNOSIS — N939 Abnormal uterine and vaginal bleeding, unspecified: Secondary | ICD-10-CM | POA: Diagnosis not present

## 2022-04-16 DIAGNOSIS — Z90722 Acquired absence of ovaries, bilateral: Secondary | ICD-10-CM | POA: Diagnosis not present

## 2022-04-16 DIAGNOSIS — Z90710 Acquired absence of both cervix and uterus: Secondary | ICD-10-CM | POA: Diagnosis not present

## 2022-04-16 NOTE — Progress Notes (Signed)
Gynecologic Oncology Interval Visit  ? ?Referring Provider: Dr. Amalia Hailey ? ?Chief Complaint: Stage IIIC-1 serous endometrial cancer ? ?Subjective:  ?Sandra Fischer is a 71 y.o. G2P0 female (s/p laparotomy myomectomy for leiomyoma), initially seen in consultation from Dr. 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  ? ?She recently was diagnosed with shingles on right trunk and is taking Valtrex.  Has some vaginal discharge.   ?  ?CA 125 ?01/13/22 11.1 ?07/08/21 11.4 ?01/02/21  12.6 ?09/10/20 11.2 ?07/03/20  13.6 ? ?CT scan 11/22 ?IMPRESSION: ?1. Stable examination without new or progressive findings in the chest abdomen or pelvis. ?2. Stable benign small pulmonary nodules. ?3. Left-sided colonic diverticulosis without findings of acute diverticulitis. ?4. Aortic Atherosclerosis (ICD10-I70.0). ?   ?Gynecologic Oncology History:  ?She initially presented to Dr. Amalia Hailey as [phone] female with complaint of postmenopausal bleeding, intermittent, heavy at times.   ? ?Transabdominal ultrasound on 05/05/2019 demonstrated endometrium measuring 35m. Uterus anteverted measuring 11.3 x 6.6 x 7.9 cm, heterogeous echo texture w/o evidence of focal masses. Within uterus multiple suspected fibroids measuring 7.2 x 4.8 x 6.6 cm and 2.6 x 1.7 x 3.1 cm. Ovaries not visualized. No adnexal masses. No free fluid in cul de sac.  ? ?Endometrial biopsy was performed on 05/20/2019 and demonstrated high-grade mixed endometrioid, predominantly, and serous carcinoma. ? ?She complains of persistent bleeding and fatigue which she attributes to the bleeding. She presents today for management. She has significant medical issues including rheumatoid arthritis requiring chronic steroids (5-10 mg daily) for a long time (she does not know how long) and leflunomide (ARAVA). She also has undergone myomectomy for leiomyoma and abdominoplasty. ? ?She also  has a history of cardiac disease. She Cardioversion for SVT on 10/17/2018. Her cardiologist Dr. [name] The [facility] for chest pain and shortness of breath, noted to be in SVT, converted to sinus rhythm with adenosine, in the setting of colitis, anemia with hemoglobin 8.5, and untreated hypothyroidism with TSH 25, which has since returned to normal range.  2D echocardiogram revealed moderately reduced LV function with LVEF 35 to 40% with diffuse hypokinesis. The patient underwent Lexiscan Myoview on 01/19/2019, which revealed revealed LVEF 49%, a mild fixed inferior wall defect, scar versus artifact, with no evidence of ischemia. She was last seen on 04/26/2019 by Cardiology ([name] with a plan to stay on her current medications and she was counseled about low sodium diet, DASH, and continuing hyperlipidemia medications.  ? 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 She is married and provides care for her mother.  ? ?06/14/2019 CT C/A/P ?IMPRESSION: ?1. Bulky fibroid uterus. There is no direct noncontrast CT evidence of endometrial malignancy. No evidence of lymphadenopathy or metastatic disease in the chest, abdomen, or pelvis. Please note that noncontrast CT is limited for the evaluation of solid organ metastases. ?2. Stable 4 mm ground-glass pulmonary nodule of the left pulmonary apex (series 3, image 24). Although nonspecific, this is an unlikely isolated manifestation of metastatic disease. Attention on follow-up.  ?3. Chronic, incidental, and postoperative findings as detailed above. ? ?She underwent TLH_BSO with [name] 06/21/2019.  ? ?Tumor size 9.3 cm, invading 99% of myometrium (3.55 of 3.6 cm), positive right external iliac sentinel lymph node. Negative washings. MSS/MMR - Intact/normal. HER2 - negative at 28%/1+.  ? ?Case was discussed at [facility] 07/04[facility]. Possible PORTEC3 regimen given subgroup analysis  showing benefit in serous history vs  clinical trial was discussed.  ? ?Based on pathology, adjuvant chemotherapy & radiation was recommended. She saw Dr. [name] on 07/14/2019 and lengthy discussion regarding rationale of adjuvant treatment. She declined adjuvant treatment. ? ?10/20/2019- CT Chest Abdomen Pelvis W Contrast ?multiple newly enlarged retroperitoneal and right iliac lymph nodes, the largest left retroperitoneal node measuring 1.9 x 1.6 cm (series 2, image 71), concerning for nodal metastatic disease. ?3. Unchanged 4 mm ground-glass pulmonary nodule of the left pulmonary apex (series 3, image 24). This remains nonspecific although is again an unlikely manifestation of pulmonary metastatic disease. ? ?Findings consistent with recurrent disease with adenopathy.  ? ?11/07/2019-02/20/20 She agreed to treatment and received 6 cycles of carbo-taxol chemotherapy  (dose reduced d/t neuropathy).  ? ?03/12/2020 CT Abdomen/pelvis ?The multiple enlarged retroperitoneal and right iliac lymph nodes identified as new on the previous exam from 10/20/2019 have resolved completely in the interval. ?2. Tiny ground-glass pulmonary nodule medial left lung apex is stable in the interval. Likely benign, continued attention on follow-up recommended. ? ?Data from GOG 258 was discussed including no differences in relapse free survival, fewer lower vaginal recurrences and fewer lower pelvic and para-aortic relapsed with the addition of RT. She was seen by Dr. [name] who did not recommend WPRT.  ? ?Post chemotherapy CT showed excellent response. Previously enlarged retroperitoneal and right iliac nodes have resolved. Adjuvant radiation was not recommended.  ? ?CT 02/14/21 chest ?IMPRESSION: ?1. Left apical 4 mm ground-glass nodule is unchanged, considered benign. ?2. A right lower lobe solid 3 mm nodule is also not significantly changed and can be presumed benign. ?3.  No acute process or evidence of metastatic disease in the chest. ?4.  Aortic Atherosclerosis  (ICD10-I70.0). ?5. Mild hepatic steatosis. ?6.  Tiny hiatal hernia. ? ?Problem List: ?Patient Active Problem List  ? Diagnosis Date Noted  ? Diabetes mellitus without complication ([place]) 41/93/7902  ? Nuclear sclerotic cataract of right eye 04/03/2021  ? Severe persistent asthma 04/03/2021  ? Problem related to unspecified psychosocial circumstances 04/03/2021  ? Corn of foot 04/03/2021  ? Dependence on continuous positive airway pressure ventilation 04/03/2021  ? Dermatophytosis of nail 04/03/2021  ? Type 2 or unspecified type diabetes mellitus 04/03/2021  ? Disorder of bone and cartilage 04/03/2021  ? Dysphagia 04/03/2021  ? 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 History of recurrent pneumonia 04/03/2021  ? Irritable colon 04/03/2021  ? Low back pain 04/03/2021  ? Other alveolar and parietoalveolar pneumonopathy 04/03/2021  ? Other and unspecified hyperlipidemia 04/03/2021  ? Other specified disease of nail 04/03/2021  ? Pain in joint, ankle and foot 04/03/2021  ? Polyp of colon 04/03/2021  ? Seborrheic dermatitis 04/03/2021  ? SVT (supraventricular tachycardia) ([place]) 04/03/2021  ? Vitiligo 04/03/2021  ? Vocal cord dysfunction 04/03/2021  ? Obesity 04/03/2021  ? Allergic asthma 04/03/2021  ? Obstructive sleep apnea 04/03/2021  ? Encounter for long-term (current) use of steroids 04/03/2021  ? Diabetic neuropathy ([place]) 08/02/2020  ? Iron deficiency anemia 07/03/2020  ? Vaginal discharge 05/30/2020  ? Encounter for antineoplastic chemotherapy 01/30/2020  ? Neuropathy due to chemotherapeutic drug ([place]) 01/30/2020  ? Anemia 01/30/2020  ? Gastroesophageal reflux disease 12/08/2019  ? Microcytic anemia 11/07/2019  ? Port-A-Cath in place 11/04/2019  ? Rheumatoid arthritis ([place]) 10/29/2019  ? Goals of care, counseling/discussion 07/20/2019  ? Endometrial cancer ([place]) 07/15/2019  ? Acute blood loss anemia 06/22/2019  ? Hypothyroidism, adult 06/15/2019  ? Myalgia and myositis 06/15/2019  ? Fibromyalgia   ?  Cardiomyopathy ([place])  12/06/2018  ? Atypical chest pain 12/06/2018  ? Perennial and seasonal allergic rhinitis 02/09/2018  ? Moderate persistent asthma 02/09/2018  ? Allergic conjunctivitis 02/09/2018  ? History of food allergy 02/09/2018

## 2022-04-17 ENCOUNTER — Telehealth: Payer: Self-pay

## 2022-04-17 LAB — CA 125: Cancer Antigen (CA) 125: 13.2 U/mL (ref 0.0–38.1)

## 2022-04-17 NOTE — Telephone Encounter (Signed)
I received a secure chat on 4/20 from NP Beckey Rutter wanting the status of the wet prep performed on Mrs.Polasek in gyn clinic on 4/19. Cancer center Lab techs stated the released the order and sent it up to main lab. I reached out to the main lab on 4/20 via telephone to see what was the hold up with the results of the wet prep. The lady who answered for the main lab states she has the wet prep sample but it was only good for 45 mins. As I stated to the Main Lab tech we sent the Wet prep up yesterday on 4/19 shortly after it was collected. [facility] lab verified that batched it and sent it up. Main Lab tech states that because when you click on patients name the [facility] account comes up instead of the Cancer center  account. She also states whoever worked the afternoon in main lab they didn't change account to under [employer] so it was never processed because they didn't see the order. ? ?I called Mrs.Goyne to offer my deepest apology and to let her know what was going on and to see if she would like to be seen to recollect sample. Patient understood but stated " I should not be charged for the visit on 4/19 or either the new visit on 4/25 because this is not my fault." I told patient I understood and would see what I could do. ?

## 2022-04-22 ENCOUNTER — Inpatient Hospital Stay (HOSPITAL_BASED_OUTPATIENT_CLINIC_OR_DEPARTMENT_OTHER): Payer: Medicare Other | Admitting: Nurse Practitioner

## 2022-04-22 VITALS — BP 148/70 | HR 71 | Temp 97.9°F | Resp 18 | Wt 200.0 lb

## 2022-04-22 DIAGNOSIS — N898 Other specified noninflammatory disorders of vagina: Secondary | ICD-10-CM

## 2022-04-22 DIAGNOSIS — M810 Age-related osteoporosis without current pathological fracture: Secondary | ICD-10-CM | POA: Insufficient documentation

## 2022-04-22 DIAGNOSIS — C541 Malignant neoplasm of endometrium: Secondary | ICD-10-CM | POA: Diagnosis not present

## 2022-04-22 LAB — WET PREP, GENITAL
Clue Cells Wet Prep HPF POC: NONE SEEN
Sperm: NONE SEEN
Trich, Wet Prep: NONE SEEN
WBC, Wet Prep HPF POC: >=10 — AB (ref <10)
Yeast Wet Prep HPF POC: NONE SEEN

## 2022-04-22 MED ORDER — METRONIDAZOLE 0.75 % VA GEL: VAGINAL | 0 refills | Status: DC

## 2022-04-22 NOTE — Progress Notes (Signed)
? ?Symptom Management Clinic ? ?[facility] ?[address], [address] ?[place], [place] [address] ?[phone] (phone) ?[phone] (fax) ? ?Patient Care Team: ?[facility], [place] as PCP - General (General Practice) ?Clent Jacks, RN as Oncology Nurse Navigator ?Noreene Filbert, MD as Radiation Oncologist (Radiation Oncology)  ? ?Name of the patient: Sandra Fischer  ?[id number]  ?1951/06/01  ? ?Date of visit: 04/22/22 ? ?Diagnosis- Endometrial cancer ? ?Chief complaint/ Reason for visit- vaginal discharge ? ?Heme/Onc history:  ?Oncology History  ?Endometrial cancer ([place])  ?07/15/2019 Initial Diagnosis  ? Endometrial cancer ([place]) ? ?  ?11/07/2019 -  Chemotherapy  ? The patient had dexamethasone (DECADRON) 4 MG tablet, 1 of 1 cycle, Start date: 10/27/2019, End date: 03/27/2020 ?palonosetron (ALOXI) injection 0.25 mg, 0.25 mg, Intravenous,  Once, 6 of 6 cycles ?Administration: 0.25 mg (11/07/2019), 0.25 mg (11/28/2019), 0.25 mg (12/19/2019), 0.25 mg (01/09/2020), 0.25 mg (01/30/2020), 0.25 mg (02/20/2020) ?pegfilgrastim (NEULASTA ONPRO KIT) injection 6 mg, 6 mg, Subcutaneous, Once, 6 of 6 cycles ?Administration: 6 mg (11/07/2019), 6 mg (11/28/2019), 6 mg (12/19/2019), 6 mg (01/09/2020), 6 mg (01/30/2020), 6 mg (02/20/2020) ?CARBOplatin (PARAPLATIN) 430 mg in sodium chloride 0.9 % 250 mL chemo infusion, 430.2 mg (90 % of original dose 478 mg), Intravenous,  Once, 6 of 6 cycles ?Dose modification:   (original dose 478 mg, Cycle 1) ?Administration: 430 mg (11/07/2019), 480 mg (11/28/2019), 430 mg (12/19/2019), 430 mg (01/09/2020), 430 mg (01/30/2020), 430 mg (02/20/2020) ?PACLitaxel (TAXOL) 330 mg in sodium chloride 0.9 % 500 mL chemo infusion (> 84m/m2), 175 mg/m2 = 330 mg, Intravenous,  Once, 6 of 6 cycles ?Dose modification: 135 mg/m2 (original dose 175 mg/m2, Cycle 3, Reason: Dose not tolerated) ?Administration: 330 mg (11/07/2019),  330 mg (11/28/2019), 252 mg (12/19/2019), 252 mg (01/09/2020), 252 mg (01/30/2020), 252 mg (02/20/2020) ?fosaprepitant (EMEND) 150 mg, dexamethasone (DECADRON) 12 mg in sodium chloride 0.9 % 145 mL IVPB, , Intravenous,  Once, 6 of 6 cycles ?Administration:  (11/07/2019),  (11/28/2019),  (12/19/2019),  (01/09/2020),  (01/30/2020),  (02/20/2020) ? ? for chemotherapy treatment.  ? ?  ? ? ?Interval history- Patient is 71year old female who presents to [facility] for complaints of vaginal discharge. She saw gyn onc last week and wet prep was collected for same complaint but was not able to be processed. She continues to have discharge and would like to be evaluated. Says she often gets yeast infections after taking certain prescriptions. Has ongoing vaginal malodor which waxes and wanes. No burning, itching. Describes discharge as white and thin.  ? ?Review of systems- Review of Systems  ?Constitutional:  Negative for chills, fever and malaise/fatigue.  ?Gastrointestinal:  Negative for abdominal pain.  ?Genitourinary:  Negative for dysuria, flank pain, hematuria and urgency.  ?Skin:  Negative for itching and rash.   ? ?Allergies  ?Allergen Reactions  ? Abatacept Hives  ? Codeine Hives, Nausea And Vomiting and Nausea Only  ? Iodine Swelling  ? Latex Itching, Hives and Rash  ? Shellfish Allergy Swelling  ? Trimethoprim Hydrochloride [Trimethoprim] Other (See Comments)  ?  Pancreatitis  ? Penicillins Nausea And Vomiting  ? ? ?Past Medical History:  ?Diagnosis Date  ? Asthma   ? Asthma   ? Asthma exacerbation 08/22/2016  ? Cancer ([facility]   ? Collagen vascular disease ([facility]   ? Diabetes mellitus without complication ([place]   ? History of Diabetes   ?  Dyspnea   ? Environmental allergies   ? Fibromyalgia   ? High cholesterol   ? Hypertension   ? Hypothyroidism   ? Iron deficiency anemia 07/03/2020  ? Neuropathy   ? RA (rheumatoid arthritis) ([place])   ? Sleep apnea   ? Thyroid disease   ? ? ?Past Surgical History:  ?Procedure  Laterality Date  ? ABDOMINAL HYSTERECTOMY    ? CATARACT EXTRACTION    ? CHOLECYSTECTOMY    ? COLONOSCOPY WITH PROPOFOL N/A 03/14/2022  ? Procedure: COLONOSCOPY WITH PROPOFOL;  Surgeon: Lesly Rubenstein, MD;  Location: [facility] ENDOSCOPY;  Service: Endoscopy;  Laterality: N/A;  DM  ? CYSTOURETHROSCOPY    ? DIAGNOSTIC LAPAROSCOPY    ? EYE SURGERY    ? fibroids removed    ? PORTA CATH REMOVAL N/A 02/14/2021  ? Procedure: PORTA CATH REMOVAL;  Surgeon: Algernon Huxley, MD;  Location: [facility] CV LAB;  Service: Cardiovascular;  Laterality: N/A;  ? THYROIDECTOMY, PARTIAL    ? tummy tuck    ? ? ?Social History  ? ?Socioeconomic History  ? Marital status: Married  ?  Spouse name: Cedric   ? Number of children: Not on file  ? Years of education: Not on file  ? Highest education level: Not on file  ?Occupational History  ? Occupation: Retired  ?Tobacco Use  ? Smoking status: Former  ?  Packs/day: 0.10  ?  Years: 0.25  ?  Pack years: 0.03  ?  Types: Cigarettes  ?  Quit date: 40  ?  Years since quitting: 43.3  ? Smokeless tobacco: Never  ?Vaping Use  ? Vaping Use: Never used  ?Substance and Sexual Activity  ? Alcohol use: No  ? Drug use: No  ? Sexual activity: Yes  ?  Birth control/protection: Post-menopausal  ?Other Topics Concern  ? Not on file  ?Social History Narrative  ? Not on file  ? ?Social Determinants of Health  ? ?Financial Resource Strain: Not on file  ?Food Insecurity: Not on file  ?Transportation Needs: Not on file  ?Physical Activity: Not on file  ?Stress: Not on file  ?Social Connections: Not on file  ?Intimate Partner Violence: Not on file  ? ? ?Family History  ?Problem Relation Age of Onset  ? Diabetes Mother   ? Hypertension Mother   ? Hyperlipidemia Mother   ? Dementia Mother   ? Breast cancer Neg Hx   ? Ovarian cancer Neg Hx   ? Colon cancer Neg Hx   ? ? ? ?Current Outpatient Medications:  ?  Acetaminophen (TYLENOL EXTRA STRENGTH PO), Take 1,300 mg by mouth 2 (two) times daily as needed., Disp: , Rfl:  ?   albuterol (ACCUNEB) 1.25 MG/3ML nebulizer solution, Take 1 ampule by nebulization every 6 (six) hours as needed for wheezing., Disp: , Rfl:  ?  albuterol (PROVENTIL HFA;VENTOLIN HFA) 108 (90 Base) MCG/ACT inhaler, Inhale 2 puffs into the lungs every 6 (six) hours as needed for wheezing or shortness of breath., Disp: 1 Inhaler, Rfl: 0 ?  aspirin EC 81 MG tablet, Take 81 mg by mouth daily., Disp: , Rfl:  ?  azelastine (ASTELIN) 0.1 % nasal spray, Place 2 sprays into both nostrils 2 (two) times daily., Disp: 30 mL, Rfl: 5 ?  benzonatate (TESSALON) 200 MG capsule, Take 200 mg by mouth 3 (three) times daily as needed., Disp: , Rfl:  ?  Calcium Carbonate-Vit D-Min (CALCIUM 600+D PLUS MINERALS) 600-400 MG-UNIT TABS, Take 1 tablet by mouth 2 (  two) times daily., Disp: , Rfl:  ?  cetirizine (ZYRTEC) 10 MG tablet, Take 10 mg by mouth daily., Disp: , Rfl:  ?  Cholecalciferol (D3 ADULT PO), Take 1 capsule by mouth daily., Disp: , Rfl:  ?  clobetasol ointment (TEMOVATE) 8.75 %, Apply 1 application topically 3 (three) times a week., Disp: 60 g, Rfl: 1 ?  clotrimazole (GYNE-LOTRIMIN) 1 % vaginal cream, Place 1 Applicatorful vaginally daily., Disp: , Rfl:  ?  clotrimazole-betamethasone (LOTRISONE) cream, Apply topically 4 (four) times daily as needed., Disp: , Rfl:  ?  desloratadine (CLARINEX) 5 MG tablet, Take 5 mg by mouth daily., Disp: , Rfl:  ?  docusate sodium (COLACE) 50 MG capsule, Take 50 mg by mouth 2 (two) times daily., Disp: , Rfl:  ?  FEROSUL 325 (65 Fe) MG tablet, TAKE ONE TABLET BY MOUTH TWICE DAILY WITH A MEAL, Disp: 60 tablet, Rfl: 1 ?  fluconazole (DIFLUCAN) 150 MG tablet, Take 150 mg by mouth once., Disp: , Rfl:  ?  fluticasone (FLONASE) 50 MCG/ACT nasal spray, Place 1 spray into both nostrils daily. , Disp: , Rfl:  ?  fluticasone-salmeterol (ADVAIR) 250-50 MCG/ACT AEPB, Inhale 1 puff into the lungs in the morning and at bedtime., Disp: , Rfl:  ?  gabapentin (NEURONTIN) 300 MG capsule, Take 1 capsule (300 mg total)  by mouth 3 (three) times daily., Disp: 90 capsule, Rfl: 1 ?  hydrALAZINE (APRESOLINE) 25 MG tablet, Take 25 mg by mouth 2 (two) times daily., Disp: , Rfl:  ?  indomethacin (INDOCIN) 50 MG capsule, [place]

## 2022-06-12 ENCOUNTER — Encounter: Payer: Self-pay | Admitting: Podiatry

## 2022-06-12 ENCOUNTER — Ambulatory Visit (INDEPENDENT_AMBULATORY_CARE_PROVIDER_SITE_OTHER): Payer: Medicare Other | Admitting: Podiatry

## 2022-06-12 DIAGNOSIS — B351 Tinea unguium: Secondary | ICD-10-CM

## 2022-06-12 DIAGNOSIS — E1142 Type 2 diabetes mellitus with diabetic polyneuropathy: Secondary | ICD-10-CM | POA: Diagnosis not present

## 2022-06-12 DIAGNOSIS — M79675 Pain in left toe(s): Secondary | ICD-10-CM

## 2022-06-12 DIAGNOSIS — G620 Drug-induced polyneuropathy: Secondary | ICD-10-CM

## 2022-06-12 DIAGNOSIS — M79674 Pain in right toe(s): Secondary | ICD-10-CM | POA: Diagnosis not present

## 2022-06-12 NOTE — Progress Notes (Signed)
This patient returns to my office for at risk foot care.  This patient requires this care by a professional since this patient will be at risk due to having diet controlled diabetes.   This patient is unable to cut nails herself since the patient cannot reach her nails.These nails are painful walking and wearing shoes.  This patient presents for at risk foot care today.  General Appearance  Alert, conversant and in no acute stress.  Vascular  Dorsalis pedis and posterior tibial  pulses are palpable  bilaterally.  Capillary return is within normal limits  bilaterally. Temperature is within normal limits  bilaterally.  Neurologic  Senn-Weinstein monofilament wire test WNL  bilaterally. Muscle power within normal limits bilaterally.  Nails Thick disfigured discolored nails with subungual debris  from hallux to fifth toes bilaterally. No evidence of bacterial infection or drainage bilaterally.  Orthopedic  No limitations of motion  feet .  No crepitus or effusions noted.  No bony pathology or digital deformities noted.  HAV  B/L.  Skin  normotropic skin with no porokeratosis noted bilaterally.  No signs of infections or ulcers noted.     Onychomycosis  Pain in right toes  Pain in left toes  Diabetes    Consent was obtained for treatment procedures.   Mechanical debridement of nails 1-5  bilaterally performed with a nail nipper.  Filed with dremel without incident.     Return office visit   10 weeks                  Told patient to return for periodic foot care and evaluation due to potential at risk complications.   Sri Clegg DPM  

## 2022-06-14 ENCOUNTER — Other Ambulatory Visit: Payer: Self-pay | Admitting: Nurse Practitioner

## 2022-07-11 ENCOUNTER — Telehealth: Payer: Self-pay | Admitting: Oncology

## 2022-07-11 NOTE — Telephone Encounter (Signed)
Pt requested that on next LAb encounter her A1C be chedked as well.

## 2022-07-11 NOTE — Telephone Encounter (Signed)
Ok to add A1C to labs on 8/4?

## 2022-07-14 ENCOUNTER — Inpatient Hospital Stay: Payer: Medicare Other

## 2022-07-16 ENCOUNTER — Inpatient Hospital Stay: Payer: Medicare Other | Admitting: Oncology

## 2022-07-16 ENCOUNTER — Other Ambulatory Visit: Payer: Self-pay

## 2022-07-16 DIAGNOSIS — E119 Type 2 diabetes mellitus without complications: Secondary | ICD-10-CM

## 2022-07-19 ENCOUNTER — Emergency Department
Admission: EM | Admit: 2022-07-19 | Discharge: 2022-07-19 | Disposition: A | Discharge status: Home / Self Care | Payer: No Typology Code available for payment source | Attending: Emergency Medicine | Admitting: Emergency Medicine

## 2022-07-19 ENCOUNTER — Other Ambulatory Visit: Payer: Self-pay

## 2022-07-19 DIAGNOSIS — J45909 Unspecified asthma, uncomplicated: Secondary | ICD-10-CM | POA: Insufficient documentation

## 2022-07-19 DIAGNOSIS — E039 Hypothyroidism, unspecified: Secondary | ICD-10-CM | POA: Diagnosis not present

## 2022-07-19 DIAGNOSIS — I10 Essential (primary) hypertension: Secondary | ICD-10-CM | POA: Diagnosis not present

## 2022-07-19 DIAGNOSIS — K0889 Other specified disorders of teeth and supporting structures: Secondary | ICD-10-CM | POA: Diagnosis present

## 2022-07-19 MED ORDER — OXYCODONE HCL 5 MG PO TABS: 5.0000 mg | ORAL_TABLET | Freq: Four times a day (QID) | ORAL | 0 refills | Status: AC | PRN

## 2022-07-19 MED ORDER — CLINDAMYCIN HCL 300 MG PO CAPS: 300.0000 mg | ORAL_CAPSULE | Freq: Four times a day (QID) | ORAL | 0 refills | Status: AC

## 2022-07-19 NOTE — ED Triage Notes (Addendum)
Patient to ER via POV with complaints of dental pain. Reports having 7 of her upper teeth removed two days ago, denies other problems other than pain. Has been taking tylenol without relief.   Patient reports having a ground level fall in her garage after the procedure, complaints of bilateral knee pain.

## 2022-07-19 NOTE — Discharge Instructions (Addendum)
-  Take all of your antibiotics as prescribed to treat potential infection.  -You may take Tylenol as needed for pain.  Do not take oxycodone and tramadol at the same time as this may cause life-threatening side effects.  -Please follow-up with your dental provider as needed.  -Return to the emergency department anytime if you begin to experience any new or worsening symptoms.

## 2022-07-19 NOTE — ED Provider Notes (Signed)
[facility] Provider Note    Event Date/Time   First MD Initiated Contact with Patient 07/19/22 1532     (approximate)   History   Chief Complaint Dental Pain   HPI Sandra Fischer is a 71 y.o. female, history of hypertension, hyperlipidemia, asthma, RA, hypothyroidism, fibromyalgia, presents to the emergency department for evaluation of dental pain.  She states that she had 7 of her front upper teeth removed approximately 48 hours prior.  She states that she was prescribed acetaminophen, but it is not helping with the pain.  Additionally reports feeling feverish throughout the day.  Denies chest pain, shortness breath, dysphagia, sore throat, bleeding/discharge from dental sites, rash/lesions, or dizziness/lightheadedness.  History Limitations: No limitations.        Physical Exam  Triage Vital Signs: ED Triage Vitals [07/19/22 1439]  Enc Vitals Group     BP (!) 171/86     Pulse Rate 88     Resp 17     Temp 98 F (36.7 C)     Temp Source Oral     SpO2 97 %     Weight      Height '5\' 2"'$  (1.575 m)     Head Circumference      Peak Flow      Pain Score 10     Pain Loc      Pain Edu?      Excl. in [place]?     Most recent vital signs: Vitals:   07/19/22 1439  BP: (!) 171/86  Pulse: 88  Resp: 17  Temp: 98 F (36.7 C)  SpO2: 97%    General: Awake, NAD.  Skin: Warm, dry. No rashes or lesions.  Eyes: PERRL. Conjunctivae normal.  CV: Good peripheral perfusion.  Resp: Normal effort.  Abd: Soft, non-tender. No distention.  Neuro: At baseline. No gross neurological deficits.   Focused Exam: Post tooth extraction sites on teeth #6 through 12.  Notable erythema around the sites.  No visible foreign bodies.  No fluctuant masses or abscesses.   Physical Exam    ED Results / Procedures / Treatments  Labs (all labs ordered are listed, but only abnormal results are displayed) Labs Reviewed - No data to display   EKG N/A.   RADIOLOGY  ED  Provider Interpretation: N/A.  No results found.  PROCEDURES:  Critical Care performed: N/A.  Procedures    MEDICATIONS ORDERED IN ED: Medications - No data to display   IMPRESSION / MDM / [place] / ED COURSE  I reviewed the triage vital signs and the nursing notes.                              Differential diagnosis includes, but is not limited to, dental abscess, periodontitis, gingivitis, foreign body.  Assessment/Plan Patient presents with dental pain following extraction of seven teeth 48 hours prior.  Physical exam shows some erythema around the gingiva.  No obvious bleeding or discharge.  No fluctuant abscesses.  She is not currently on antibiotics.  I suspect that she may be in the early stages of a dental infection.  We will provide her with a prescription for clindamycin.  In addition, we will provide her with a short-term prescription for oxycodone for pain management.  We will plan to discharge.  Patient's presentation is most consistent with acute, uncomplicated illness.   Provided the patient with anticipatory guidance, return precautions, and educational  material. Encouraged the patient to return to the emergency department at any time if they begin to experience any new or worsening symptoms. Patient expressed understanding and agreed with the plan.       FINAL CLINICAL IMPRESSION(S) / ED DIAGNOSES   Final diagnoses:  Pain, dental     Rx / DC Orders   ED Discharge Orders          Ordered    clindamycin (CLEOCIN) 300 MG capsule  4 times daily        07/19/22 1641    oxyCODONE (ROXICODONE) 5 MG immediate release tablet  Every 6 hours PRN        07/19/22 1641             Note:  This document was prepared using Dragon voice recognition software and may include unintentional dictation errors.   Teodoro Spray, [place] 07/19/22 1651    Naaman Plummer, MD 07/19/22 [phone]

## 2022-08-01 ENCOUNTER — Inpatient Hospital Stay: Payer: Medicare Other | Attending: Nurse Practitioner

## 2022-08-01 DIAGNOSIS — M069 Rheumatoid arthritis, unspecified: Secondary | ICD-10-CM | POA: Insufficient documentation

## 2022-08-01 DIAGNOSIS — Z79899 Other long term (current) drug therapy: Secondary | ICD-10-CM | POA: Diagnosis not present

## 2022-08-01 DIAGNOSIS — Z8679 Personal history of other diseases of the circulatory system: Secondary | ICD-10-CM | POA: Diagnosis not present

## 2022-08-01 DIAGNOSIS — I429 Cardiomyopathy, unspecified: Secondary | ICD-10-CM | POA: Insufficient documentation

## 2022-08-01 DIAGNOSIS — E119 Type 2 diabetes mellitus without complications: Secondary | ICD-10-CM | POA: Insufficient documentation

## 2022-08-01 DIAGNOSIS — R918 Other nonspecific abnormal finding of lung field: Secondary | ICD-10-CM | POA: Insufficient documentation

## 2022-08-01 DIAGNOSIS — K219 Gastro-esophageal reflux disease without esophagitis: Secondary | ICD-10-CM | POA: Diagnosis not present

## 2022-08-01 DIAGNOSIS — G629 Polyneuropathy, unspecified: Secondary | ICD-10-CM | POA: Insufficient documentation

## 2022-08-01 DIAGNOSIS — C541 Malignant neoplasm of endometrium: Secondary | ICD-10-CM | POA: Insufficient documentation

## 2022-08-01 DIAGNOSIS — D509 Iron deficiency anemia, unspecified: Secondary | ICD-10-CM | POA: Insufficient documentation

## 2022-08-01 LAB — COMPREHENSIVE METABOLIC PANEL
ALT: 20 U/L (ref 0–44)
AST: 30 U/L (ref 15–41)
Albumin: 3.5 g/dL (ref 3.5–5.0)
Alkaline Phosphatase: 81 U/L (ref 38–126)
Anion gap: 9 (ref 5–15)
BUN: 11 mg/dL (ref 8–23)
CO2: 24 mmol/L (ref 22–32)
Calcium: 8.7 mg/dL — ABNORMAL LOW (ref 8.9–10.3)
Chloride: 105 mmol/L (ref 98–111)
Creatinine, Ser: 0.65 mg/dL (ref 0.44–1.00)
GFR, Estimated: >60 mL/min (ref >60)
Glucose, Bld: 104 mg/dL — ABNORMAL HIGH (ref 70–99)
Potassium: 3.5 mmol/L (ref 3.5–5.1)
Sodium: 138 mmol/L (ref 135–145)
Total Bilirubin: 0.4 mg/dL (ref 0.3–1.2)
Total Protein: 7.6 g/dL (ref 6.5–8.1)

## 2022-08-01 LAB — CBC WITH DIFFERENTIAL/PLATELET
Abs Immature Granulocytes: 0.02 10*3/uL (ref 0.00–0.07)
Basophils Absolute: 0 10*3/uL (ref 0.0–0.1)
Basophils Relative: 0 %
Eosinophils Absolute: 0.8 10*3/uL — ABNORMAL HIGH (ref 0.0–0.5)
Eosinophils Relative: 10 %
HCT: 36.9 % (ref 36.0–46.0)
Hemoglobin: 11.4 g/dL — ABNORMAL LOW (ref 12.0–15.0)
Immature Granulocytes: 0 %
Lymphocytes Relative: 29 %
Lymphs Abs: 2.2 10*3/uL (ref 0.7–4.0)
MCH: 26.4 pg (ref 26.0–34.0)
MCHC: 30.9 g/dL (ref 30.0–36.0)
MCV: 85.4 fL (ref 80.0–100.0)
Monocytes Absolute: 0.8 10*3/uL (ref 0.1–1.0)
Monocytes Relative: 10 %
Neutro Abs: 3.8 10*3/uL (ref 1.7–7.7)
Neutrophils Relative %: 51 %
Platelets: 241 10*3/uL (ref 150–400)
RBC: 4.32 MIL/uL (ref 3.87–5.11)
RDW: 15.9 % — ABNORMAL HIGH (ref 11.5–15.5)
WBC: 7.5 10*3/uL (ref 4.0–10.5)
nRBC: 0 % (ref 0.0–0.2)

## 2022-08-03 LAB — CA 125: Cancer Antigen (CA) 125: 11.6 U/mL (ref 0.0–38.1)

## 2022-08-04 ENCOUNTER — Inpatient Hospital Stay (HOSPITAL_BASED_OUTPATIENT_CLINIC_OR_DEPARTMENT_OTHER): Payer: Medicare Other | Admitting: Oncology

## 2022-08-04 ENCOUNTER — Encounter: Payer: Self-pay | Admitting: Oncology

## 2022-08-04 VITALS — HR 66 | Temp 97.7°F | Resp 17 | Wt 197.0 lb

## 2022-08-04 DIAGNOSIS — D649 Anemia, unspecified: Secondary | ICD-10-CM

## 2022-08-04 DIAGNOSIS — C541 Malignant neoplasm of endometrium: Secondary | ICD-10-CM

## 2022-08-04 DIAGNOSIS — R911 Solitary pulmonary nodule: Secondary | ICD-10-CM

## 2022-08-04 DIAGNOSIS — M069 Rheumatoid arthritis, unspecified: Secondary | ICD-10-CM | POA: Diagnosis not present

## 2022-08-04 NOTE — Progress Notes (Signed)
Patient here for oncology follow-up appointment, expresses concerns of recent fall and trouble chewing

## 2022-08-04 NOTE — Progress Notes (Signed)
Hematology/Fischer Progress note [phone] [phone]      Patient Care Team: [facility], [place] as PCP - General (General Practice) Clent Jacks, RN as Fischer Nurse Navigator Noreene Filbert, MD as Radiation Oncologist (Radiation Fischer)  REFERRING PROVIDER: Felipa Eth, MD  CHIEF COMPLAINTS/REASON FOR VISIT:  Follow up for endometrial cancer HISTORY OF PRESENTING ILLNESS:   Sandra Fischer is a  71 y.o.  female with PMH listed below was seen in consultation at the request of  Sandra Eth, MD  for evaluation of endometrial cancer Patient had TH/BSO sentinel lymph node biopsy by Sandra Fischer Dr. Allen Fischer on 06/21/2019. Extensive medical records from [facility] system review was performed by me.  Tumor size 9.3 cm, invading 99% of myometrium [3.55 out of 3.6 cm], positive right external iliac sentinel lymph nodes.  Negative washing.  No LVI, serosa is uninvolved. 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 [id number]  FIGO stage IIIC1 MSI intact HER 2 negative.   Patient was seen by Dr. BFransisca Connorson 07/06/2019 due to vaginal odor. Patient presents for establishing care and discussion for adjuvant chemotherapy radiation.  Per Dr. BBlake Divinenote, [facility].  Portex 3 regimen was recommended given subgroup analysis showing benefit in the serous histology versus clinical trial. HER2 was ordered and pending results.   Patient has a history of SVT.  10/14/2018 2D echo reviewed moderately reduced LV function with LVEF 35 to 40% with diffuse hypokinesis.  Patient had Lexiscan Myoview on 01/19/2019.  LVEF 49%.  SPECT images revealed a mild fixed inferior wall defect, scar versus artifact.  No evidence of ischemia.  She follows up with cardiology SandraParaschos 04/26/2019 with a plan to stay on her current medication. 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 She is married and providing care for her mother.  # 10/20/2019 CT chest abdomen pelvis with contrast showed disease recurrence.  There are multiple newly enlarged retroperitoneal and right iliac lymph nodes the largest the left retroperitoneal node measuring 1.9 x 1.6 cm concerning for nodal metastasis disease.  Unchanged 4 mm groundglass pulmonary nodule of the left pulmonary apex.  This remains nonspecific. #Cardiomyopathy, LVEF35 to 40% Last seen by cardiology 04/26/2019.  Recommend patient to continue follow-up with cardiology.  # GERD  ER visit on 11/13/2019 due to chest pain which started after he ate at a restaurant and developed burning sensation in her chest.  No associated dyspnea. Patient had a CTA which showed no pulmonary embolism.  Patient symptoms got better after GI cocktail.  Was considered to be secondary to acid reflux.  Patient takes omeprazole 20 mg daily.   # 11/03/2019 -02/20/2020 S/p 6 cycles of chemotherapy with carboplatin and Taxol. Postchemotherapy CT scan showed excellent treatment response.   I discussed with Sandra Fischer, according to data from GOG 258,  there were no differences in relapse-free survival fewer lower vaginal recurrences and fewer lower pelvic and para-aortic relapses with the addition of RT  11/11/2021, CT chest abdomen pelvis showed stable examination without new/progressive diseases.  Stable benign small bilateral pulmonary nodules.  Left-sided colonic diverticulosis without findings of acute radiculitis.  Aortic atherosclerosis.  INTERVAL HISTORY Sandra Northis a 71y.o. female who has above history reviewed by me today presents for follow up visit for management of endometrial cancer. Patient has neuropathy, she follows up with her rheumatologist and she is on 3 times daily.  She has falls  Review  of Systems  Constitutional:  Positive for fatigue. Negative for appetite change, chills and fever.  HENT:   Negative for hearing loss and  voice change.   Eyes:  Negative for eye problems.  Respiratory:  Negative for chest tightness and cough.   Cardiovascular:  Negative for chest pain.  Gastrointestinal:  Negative for abdominal distention, abdominal pain and blood in stool.  Endocrine: Negative for hot flashes.  Genitourinary:  Negative for difficulty urinating and frequency.   Musculoskeletal:  Negative for arthralgias.  Skin:  Negative for itching and rash.  Neurological:  Positive for numbness. Negative for extremity weakness.  Hematological:  Negative for adenopathy.  Psychiatric/Behavioral:  Negative for confusion.     MEDICAL HISTORY:  Past Medical History:  Diagnosis Date   Asthma    Asthma    Asthma exacerbation 08/22/2016   Cancer ([place])    Collagen vascular disease ([place])    Diabetes mellitus without complication ([place])    History of Diabetes    Dyspnea    Environmental allergies    Fibromyalgia    High cholesterol    Hypertension    Hypothyroidism    Iron deficiency anemia 07/03/2020   Neuropathy    RA (rheumatoid arthritis) ([place])    Sleep apnea    Thyroid disease     SURGICAL HISTORY: Past Surgical History:  Procedure Laterality Date   ABDOMINAL HYSTERECTOMY     CATARACT EXTRACTION     CHOLECYSTECTOMY     COLONOSCOPY WITH PROPOFOL N/A 03/14/2022   Procedure: COLONOSCOPY WITH PROPOFOL;  Surgeon: Sandra Rubenstein, MD;  Location: [facility] ENDOSCOPY;  Service: Endoscopy;  Laterality: N/A;  DM   CYSTOURETHROSCOPY     DIAGNOSTIC LAPAROSCOPY     EYE SURGERY     fibroids removed     PORTA CATH REMOVAL N/A 02/14/2021   Procedure: PORTA CATH REMOVAL;  Surgeon: Sandra Huxley, MD;  Location: [facility];  Service: Cardiovascular;  Laterality: N/A;   THYROIDECTOMY, PARTIAL     tummy tuck      SOCIAL HISTORY: Social History   Socioeconomic History   Marital status: Married    Spouse name: Sandra Fischer    Number of children: Not on file   Years of education: Not on file   Highest education  level: Not on file  Occupational History   Occupation: Retired  Tobacco Use   Smoking status: Former    Packs/day: 0.10    Years: 0.25    Total pack years: 0.03    Types: Cigarettes    Quit date: 1980    Years since quitting: 43.6   Smokeless tobacco: Never  Vaping Use   Vaping Use: Never used  Substance and Sexual Activity   Alcohol use: No   Drug use: No   Sexual activity: Yes    Birth control/protection: Post-menopausal  Other Topics Concern   Not on file  Social History Narrative   Not on file   Social Determinants of Health   Financial Resource Strain: Not on file  Food Insecurity: Not on file  Transportation Needs: Not on file  Physical Activity: Not on file  Stress: Not on file  Social Connections: Not on file  Intimate Partner Violence: Not on file    FAMILY HISTORY: Family History  Problem Relation Age of Onset   Diabetes Mother    Hypertension Mother    Hyperlipidemia Mother    Dementia Mother    Breast cancer Neg Hx    Ovarian cancer Neg Hx  Colon cancer Neg Hx     ALLERGIES:  is allergic to abatacept, codeine, iodine, latex, shellfish allergy, trimethoprim hydrochloride [trimethoprim], and penicillins.  MEDICATIONS:  Current Outpatient Medications  Medication Sig Dispense Refill   Acetaminophen (TYLENOL EXTRA STRENGTH PO) Take 1,300 mg by mouth 2 (two) times daily as needed.     albuterol (ACCUNEB) 1.25 MG/3ML nebulizer solution Take 1 ampule by nebulization every 6 (six) hours as needed for wheezing.     albuterol (PROVENTIL HFA;VENTOLIN HFA) 108 (90 Base) MCG/ACT inhaler Inhale 2 puffs into the lungs every 6 (six) hours as needed for wheezing or shortness of breath. 1 Inhaler 0   ascorbic acid (VITAMIN C) 500 MG tablet Take 500 mg by mouth daily.     aspirin EC 81 MG tablet Take 81 mg by mouth daily.     azelastine (ASTELIN) 0.1 % nasal spray Place 2 sprays into both nostrils 2 (two) times daily. 30 mL 5   benzonatate (TESSALON) 200 MG capsule  Take 200 mg by mouth 3 (three) times daily as needed.     Calcium Carbonate-Vit D-Min (CALCIUM 600+D PLUS MINERALS) 600-400 MG-UNIT TABS Take 1 tablet by mouth 2 (two) times daily.     cetirizine (ZYRTEC) 10 MG tablet Take 10 mg by mouth daily.     Cholecalciferol (D3 ADULT PO) Take 1 capsule by mouth daily.     clotrimazole (GYNE-LOTRIMIN) 1 % vaginal cream Place 1 Applicatorful vaginally daily.     clotrimazole-betamethasone (LOTRISONE) cream Apply topically 4 (four) times daily as needed.     desloratadine (CLARINEX) 5 MG tablet Take 5 mg by mouth daily.     docusate sodium (COLACE) 50 MG capsule Take 50 mg by mouth 2 (two) times daily.     FEROSUL 325 (65 Fe) MG tablet TAKE ONE TABLET BY MOUTH TWICE DAILY WITH A MEAL 60 tablet 1   fluconazole (DIFLUCAN) 150 MG tablet Take 150 mg by mouth once.     fluticasone (FLONASE) 50 MCG/ACT nasal spray Place 1 spray into both nostrils daily.      fluticasone-salmeterol (ADVAIR) 250-50 MCG/ACT AEPB Inhale 1 puff into the lungs in the morning and at bedtime.     gabapentin (NEURONTIN) 300 MG capsule Take 1 capsule (300 mg total) by mouth 3 (three) times daily. 90 capsule 1   hydrALAZINE (APRESOLINE) 25 MG tablet Take 25 mg by mouth 2 (two) times daily.     indomethacin (INDOCIN) 50 MG capsule Take 50 mg by mouth in the morning, at noon, in the evening, and at bedtime.     ipratropium (ATROVENT) 0.02 % nebulizer solution Take 0.5 mg by nebulization every 4 (four) hours as needed.      leflunomide (ARAVA) 20 MG tablet Take 20 mg by mouth daily.     levothyroxine (SYNTHROID) 88 MCG tablet Take by mouth.     levothyroxine (SYNTHROID, LEVOTHROID) 88 MCG tablet Take 88 mcg by mouth daily before breakfast.     lisinopril (PRINIVIL,ZESTRIL) 40 MG tablet Take 20 mg by mouth in the morning and at bedtime.      lisinopril (ZESTRIL) 40 MG tablet Take by mouth.     metoprolol succinate (TOPROL-XL) 50 MG 24 hr tablet Take 50 mg by mouth daily. Take with or immediately  following a meal.     metroNIDAZOLE (METROGEL VAGINAL) 0.75 % vaginal gel Insert 5 grams (37.5 mg of metronidazole) of gel into vagina once daily at bedtime for five days 70 g 0   montelukast (SINGULAIR)  10 MG tablet Take 10 mg by mouth at bedtime.     nortriptyline (PAMELOR) 25 MG capsule Take 25 mg by mouth at bedtime.     olopatadine (PATANOL) 0.1 % ophthalmic solution Place 1 drop into both eyes 2 (two) times daily. 5 mL 5   omeprazole (PRILOSEC) 10 MG capsule Take 20 mg by mouth daily.     pravastatin (PRAVACHOL) 40 MG tablet Take 40 mg by mouth daily.     predniSONE (DELTASONE) 5 MG tablet Take 1 tablet by mouth daily.     Prenatal Vit-Fe Fumarate-FA (MULTIVITAMIN-PRENATAL) 27-0.8 MG TABS tablet Take 1 tablet by mouth daily.      senna-docusate (SENOKOT-S) 8.6-50 MG tablet Take 1 tablet by mouth at bedtime as needed for mild constipation.     tiotropium (SPIRIVA) 18 MCG inhalation capsule Place 18 mcg into inhaler and inhale daily.     traMADol (ULTRAM) 50 MG tablet Take 100 mg by mouth as needed (for rheumatoid arthritis).     verapamil (CALAN) 120 MG tablet Take 240 mg by mouth 2 (two) times daily.     EPINEPHrine 0.3 mg/0.3 mL IJ SOAJ injection Use as directed for severe allergic reaction. (Patient not taking: Reported on 04/22/2022) 2 Device 1   furosemide (LASIX) 10 MG/ML solution Take by mouth daily. (Patient not taking: Reported on 04/16/2022)     leflunomide (ARAVA) 20 MG tablet Take 1 tablet by mouth daily.     nitroGLYCERIN (NITROSTAT) 0.4 MG SL tablet Place under the tongue. Place 1 tablet (0.4 mg total) under the tongue every 5 (five) minutes as needed for Chest pain May take up to 3 doses.     No current facility-administered medications for this visit.     PHYSICAL EXAMINATION: ECOG PERFORMANCE STATUS: 1 - Symptomatic but completely ambulatory  Physical Exam Constitutional:      General: She is not in acute distress.    Appearance: She is obese.  HENT:     Head:  Normocephalic and atraumatic.  Eyes:     General: No scleral icterus. Cardiovascular:     Rate and Rhythm: Normal rate and regular rhythm.     Heart sounds: Normal heart sounds.  Pulmonary:     Effort: Pulmonary effort is normal. No respiratory distress.     Breath sounds: No wheezing.  Abdominal:     General: Bowel sounds are normal. There is no distension.     Palpations: Abdomen is soft.  Musculoskeletal:        General: Deformity present. Normal range of motion.     Cervical back: Normal range of motion and neck supple.  Skin:    General: Skin is warm and dry.     Findings: No erythema or rash.  Neurological:     Mental Status: She is alert and oriented to person, place, and time. Mental status is at baseline.     Cranial Nerves: No cranial nerve deficit.     Coordination: Coordination normal.  Psychiatric:        Mood and Affect: Mood normal.     LABORATORY DATA:  I have reviewed the data as listed Lab Results  Component Value Date   WBC 7.5 08/01/2022   HGB 11.4 (L) 08/01/2022   HCT 36.9 08/01/2022   MCV 85.4 08/01/2022   PLT 241 08/01/2022   Recent Labs    01/13/22 1331 04/05/22 1218 08/01/22 1254  NA 137 137 138  K 3.7 3.1* 3.5  CL 101 102 105  CO2  _0 GLUCOSE 131* 108* 104*  BUN _1 CREATININE 0.76 0.64 0.65  CALCIUM 8.9 9.1 8.7*  GFRNONAA >60 >60 >60  PROT 7.5  --  7.6  ALBUMIN 3.6  --  3.5  AST 19  --  30  ALT 16  --  20  ALKPHOS 79  --  81  BILITOT 0.2*  --  0.4    Iron/TIBC/Ferritin/ %Sat    Component Value Date/Time   IRON 43 01/02/2021 1331   TIBC 200 (L) 01/02/2021 1331   FERRITIN 759 (H) 01/02/2021 1331   IRONPCTSAT 22 01/02/2021 1331      RADIOGRAPHIC STUDIES: I have personally reviewed the radiological images as listed and agreed with the findings in the report., No results found.    ASSESSMENT & PLAN:  1. Endometrial cancer (HCC)   2. Lung nodule   3. Rheumatoid arthritis, involving unspecified site,  unspecified whether rheumatoid factor present ([place])   4. Anemia, unspecified type    #Recurrent endometrial cancer Status post 6 cycles of chemotherapy with carboplatin and Taxol. Post chemotherapy CT scan showed excellent response and resolution of lymphadenopathy. 11/15/ 2022, CT scan showed no disease progression or recurrence. 04/05/2022, CT angiogram chest PE program showed no PE, peripheral bronchi secondary suggesting of acute bronchitis.  No evidence of consolidating pneumonia or pulmonary edema. Labs are reviewed and discussed with patient. CA125 has been stable. Continue alternate follow-up with GYN Fischer and Fischer every 6 months.  #Anemia of chronic inflammation , iron deficiency anemia, previously status post IV Venofer treatments Hemoglobin stable at 11.4.  Continue monitor.  #Neuropathy, suspect possibly be secondary to neuropathy.  She is on gabapentin 300 mg 3 times daily managed by rheumatologist. #Rheumatoid arthritis, follow-up rheumatologist.    #pulmonary nodule, stable lung nodules   Return of visit: 6 months. Earlie Server, MD, PhD  08/04/2022

## 2022-08-06 LAB — HEMOGLOBIN A1C
Hgb A1c MFr Bld: 6.6 % — ABNORMAL HIGH (ref 4.8–5.6)
Mean Plasma Glucose: 142.72 mg/dL

## 2022-09-04 ENCOUNTER — Encounter: Payer: Self-pay | Admitting: Podiatry

## 2022-09-04 ENCOUNTER — Ambulatory Visit (INDEPENDENT_AMBULATORY_CARE_PROVIDER_SITE_OTHER): Payer: Medicare Other | Admitting: Podiatry

## 2022-09-04 DIAGNOSIS — M79675 Pain in left toe(s): Secondary | ICD-10-CM | POA: Diagnosis not present

## 2022-09-04 DIAGNOSIS — G620 Drug-induced polyneuropathy: Secondary | ICD-10-CM

## 2022-09-04 DIAGNOSIS — E1142 Type 2 diabetes mellitus with diabetic polyneuropathy: Secondary | ICD-10-CM

## 2022-09-04 DIAGNOSIS — M79674 Pain in right toe(s): Secondary | ICD-10-CM

## 2022-09-04 DIAGNOSIS — B351 Tinea unguium: Secondary | ICD-10-CM | POA: Diagnosis not present

## 2022-09-04 DIAGNOSIS — T451X5A Adverse effect of antineoplastic and immunosuppressive drugs, initial encounter: Secondary | ICD-10-CM

## 2022-09-04 NOTE — Progress Notes (Signed)
This patient returns to my office for at risk foot care.  This patient requires this care by a professional since this patient will be at risk due to having diet controlled diabetes.   This patient is unable to cut nails herself since the patient cannot reach her nails.These nails are painful walking and wearing shoes.  This patient presents for at risk foot care today.  General Appearance  Alert, conversant and in no acute stress.  Vascular  Dorsalis pedis and posterior tibial  pulses are palpable  bilaterally.  Capillary return is within normal limits  bilaterally. Temperature is within normal limits  bilaterally.  Neurologic  Senn-Weinstein monofilament wire test WNL  bilaterally. Muscle power within normal limits bilaterally.  Nails Thick disfigured discolored nails with subungual debris  from hallux to fifth toes bilaterally. No evidence of bacterial infection or drainage bilaterally.  Orthopedic  No limitations of motion  feet .  No crepitus or effusions noted.  No bony pathology or digital deformities noted.  HAV  B/L.  Skin  normotropic skin with no porokeratosis noted bilaterally.  No signs of infections or ulcers noted.     Onychomycosis  Pain in right toes  Pain in left toes  Diabetes    Consent was obtained for treatment procedures.   Mechanical debridement of nails 1-5  bilaterally performed with a nail nipper.  Filed with dremel without incident.     Return office visit   10 weeks                  Told patient to return for periodic foot care and evaluation due to potential at risk complications.   Meeah Totino DPM  

## 2022-09-17 ENCOUNTER — Other Ambulatory Visit: Payer: Self-pay

## 2022-09-17 ENCOUNTER — Emergency Department
Admission: EM | Admit: 2022-09-17 | Discharge: 2022-09-17 | Disposition: A | Discharge status: Home / Self Care | Payer: No Typology Code available for payment source | Attending: Emergency Medicine | Admitting: Emergency Medicine

## 2022-09-17 ENCOUNTER — Encounter: Payer: Self-pay | Admitting: Emergency Medicine

## 2022-09-17 ENCOUNTER — Emergency Department: Payer: No Typology Code available for payment source

## 2022-09-17 DIAGNOSIS — J3489 Other specified disorders of nose and nasal sinuses: Secondary | ICD-10-CM | POA: Diagnosis present

## 2022-09-17 DIAGNOSIS — J320 Chronic maxillary sinusitis: Secondary | ICD-10-CM | POA: Diagnosis not present

## 2022-09-17 DIAGNOSIS — W19XXXA Unspecified fall, initial encounter: Secondary | ICD-10-CM | POA: Diagnosis not present

## 2022-09-17 DIAGNOSIS — M170 Bilateral primary osteoarthritis of knee: Secondary | ICD-10-CM | POA: Diagnosis not present

## 2022-09-17 LAB — CBC
HCT: 39.8 % (ref 36.0–46.0)
Hemoglobin: 12.2 g/dL (ref 12.0–15.0)
MCH: 25.8 pg — ABNORMAL LOW (ref 26.0–34.0)
MCHC: 30.7 g/dL (ref 30.0–36.0)
MCV: 84.1 fL (ref 80.0–100.0)
Platelets: 247 10*3/uL (ref 150–400)
RBC: 4.73 MIL/uL (ref 3.87–5.11)
RDW: 15.2 % (ref 11.5–15.5)
WBC: 7.7 10*3/uL (ref 4.0–10.5)
nRBC: 0 % (ref 0.0–0.2)

## 2022-09-17 LAB — BASIC METABOLIC PANEL
Anion gap: 10 (ref 5–15)
BUN: 8 mg/dL (ref 8–23)
CO2: 23 mmol/L (ref 22–32)
Calcium: 9.5 mg/dL (ref 8.9–10.3)
Chloride: 104 mmol/L (ref 98–111)
Creatinine, Ser: 0.74 mg/dL (ref 0.44–1.00)
GFR, Estimated: >60 mL/min (ref >60)
Glucose, Bld: 119 mg/dL — ABNORMAL HIGH (ref 70–99)
Potassium: 3.8 mmol/L (ref 3.5–5.1)
Sodium: 137 mmol/L (ref 135–145)

## 2022-09-17 MED ORDER — FLUTICASONE PROPIONATE 50 MCG/ACT NA SUSP: 1.0000 | Freq: Two times a day (BID) | NASAL | 0 refills | Status: AC

## 2022-09-17 MED ORDER — PREDNISONE 50 MG PO TABS: 50.0000 mg | ORAL_TABLET | Freq: Every day | ORAL | 0 refills | Status: DC

## 2022-09-17 MED ORDER — FLUCONAZOLE 150 MG PO TABS: 150.0000 mg | ORAL_TABLET | Freq: Once | ORAL | 0 refills | Status: AC

## 2022-09-17 MED ORDER — CETIRIZINE HCL 10 MG PO TABS: 10.0000 mg | ORAL_TABLET | Freq: Every day | ORAL | 0 refills | Status: AC

## 2022-09-17 NOTE — ED Triage Notes (Signed)
Pt in via POV, reports being treated w/ Doxycycline for Sinus Infection x approximately 1 week.  Complaints of ongoing headache, cough, congestion, asked to be rechecked.  Also complains of bilateral knee pain from previous fall, states pain should be resolved by now but its not.    NAD noted at this time.

## 2022-09-17 NOTE — ED Provider Notes (Signed)
[facility] Provider Note  Patient Contact: 10:31 PM (approximate)   History   Recurrent Sinusitis   HPI  Sandra Fischer is a 71 y.o. female who presents the emergency department for 2 complaints.  Patient's primary concern is that she still has some sinus pressure and being diagnosed with sinusitis.  She is on Doxy for same and states that she has 1 more day of medications.  Patient does not take any other medications to include nasal sprays or antihistamines for her sinus pressure.  No fevers or chills, sore throat, other URI symptoms such as cough.  Patient is also concerned that she has ongoing knee pain after a fall 2 months ago.  Patient is still ambulatory but states that they did not do any x-rays and with the ongoing pain she is concerned that she "might of broken something."  No other injuries.     Physical Exam   Triage Vital Signs: ED Triage Vitals  Enc Vitals Group     BP 09/17/22 2123 (!) 172/103     Pulse Rate 09/17/22 2123 (!) 107     Resp 09/17/22 2123 17     Temp 09/17/22 2123 98.7 F (37.1 C)     Temp Source 09/17/22 2123 Oral     SpO2 09/17/22 2123 96 %     Weight 09/17/22 2126 198 lb (89.8 kg)     Height 09/17/22 2126 '5\' 2"'$  (1.575 m)     Head Circumference --      Peak Flow --      Pain Score 09/17/22 2126 7     Pain Loc --      Pain Edu? --      Excl. in [place]? --     Most recent vital signs: Vitals:   09/17/22 2123  BP: (!) 172/103  Pulse: (!) 107  Resp: 17  Temp: 98.7 F (37.1 C)  SpO2: 96%     General: Alert and in no acute distress. ENT:      Ears:       Nose: No congestion/rhinnorhea.      Mouth/Throat: Mucous membranes are moist. Hematological/Lymphatic/Immunilogical: No cervical lymphadenopathy. Cardiovascular:  Good peripheral perfusion Respiratory: Normal respiratory effort without tachypnea or retractions. Lungs CTAB. Good air entry to the bases with no decreased or absent breath sounds. Musculoskeletal:  Full range of motion to all extremities.  No obvious signs of injury to either knee.  Good range of motion.  No specific tenderness to palpation.  No palpable abnormalities.  Pulses sensation intact distally. Neurologic:  No gross focal neurologic deficits are appreciated.  Skin:   No rash noted Other:   ED Results / Procedures / Treatments   Labs (all labs ordered are listed, but only abnormal results are displayed) Labs Reviewed  CBC - Abnormal; Notable for the following components:      Result Value   MCH 25.8 (*)    All other components within normal limits  BASIC METABOLIC PANEL - Abnormal; Notable for the following components:   Glucose, Bld 119 (*)    All other components within normal limits     EKG     RADIOLOGY  I personally viewed, evaluated, and interpreted these images as part of my medical decision making, as well as reviewing the written report by the radiologist.  ED Provider Interpretation: No acute traumatic finding or fractures identified.  Arthritis identified to both knees.  DG Knee Complete 4 Views Right  Result Date: 09/17/2022 CLINICAL  DATA:  Bilateral knee pain after fall EXAM: RIGHT KNEE - COMPLETE 4+ VIEW; LEFT KNEE - COMPLETE 4+ VIEW COMPARISON:  10/14/2018, 04/05/2022 FINDINGS: Left knee: Frontal, bilateral oblique, lateral views of the left knee are obtained. No acute fracture, subluxation, or dislocation. Mild 3 compartmental osteoarthritis. No joint effusion. Soft tissues are unremarkable. Right knee: Frontal, bilateral oblique, lateral views of the right knee are obtained. No fracture, subluxation, or dislocation. Mild 3 compartmental osteoarthritis greatest in the lateral compartment. Trace joint effusion, likely reactive. Soft tissues are unremarkable. IMPRESSION: 1. Mild bilateral 3 compartmental knee osteoarthritis, right greater than left. 2. Trace right knee effusion, likely reactive. 3. No acute fracture within either knee. Electronically  Signed   By: Randa Ngo M.D.   On: 09/17/2022 23:13   DG Knee Complete 4 Views Left  Result Date: 09/17/2022 CLINICAL DATA:  Bilateral knee pain after fall EXAM: RIGHT KNEE - COMPLETE 4+ VIEW; LEFT KNEE - COMPLETE 4+ VIEW COMPARISON:  10/14/2018, 04/05/2022 FINDINGS: Left knee: Frontal, bilateral oblique, lateral views of the left knee are obtained. No acute fracture, subluxation, or dislocation. Mild 3 compartmental osteoarthritis. No joint effusion. Soft tissues are unremarkable. Right knee: Frontal, bilateral oblique, lateral views of the right knee are obtained. No fracture, subluxation, or dislocation. Mild 3 compartmental osteoarthritis greatest in the lateral compartment. Trace joint effusion, likely reactive. Soft tissues are unremarkable. IMPRESSION: 1. Mild bilateral 3 compartmental knee osteoarthritis, right greater than left. 2. Trace right knee effusion, likely reactive. 3. No acute fracture within either knee. Electronically Signed   By: Randa Ngo M.D.   On: 09/17/2022 23:13    PROCEDURES:  Critical Care performed: No  Procedures   MEDICATIONS ORDERED IN ED: Medications - No data to display   IMPRESSION / MDM / [place] / ED COURSE  I reviewed the triage vital signs and the nursing notes.                              Differential diagnosis includes, but is not limited to, sinusitis, allergic rhinitis, knee contusion, osteoarthritis, fracture   Patient's presentation is most consistent with acute presentation with potential threat to life or bodily function.   Patient's diagnosis is consistent with sinusitis.  Patient presented to the ED complaining of ongoing sinus pressure.  She is on antibiotics and has improved but is still slightly there in regards to the pressure.  Patient is not taking any antihistamines or nasal sprays.  Patient also was complaining of bilateral knee pain after fall 2 months ago.  X-rays revealed osteoarthritis without evidence of  fracture.  We will treat with antihistamine, Flonase and brief course of steroid for the patient.  Follow-up with orthopedics for injections if symptoms or not improving with oral meds.  Patient is given ED precautions to return to the ED for any worsening or new symptoms.        FINAL CLINICAL IMPRESSION(S) / ED DIAGNOSES   Final diagnoses:  Chronic maxillary sinusitis  Primary osteoarthritis of both knees     Rx / DC Orders   ED Discharge Orders          Ordered    fluticasone (FLONASE) 50 MCG/ACT nasal spray  2 times daily        09/17/22 2322    cetirizine (ZYRTEC) 10 MG tablet  Daily        09/17/22 2322    predniSONE (DELTASONE) 50 MG tablet  Daily with breakfast        09/17/22 2322             Note:  This document was prepared using Dragon voice recognition software and may include unintentional dictation errors.   Darletta Moll, PA-C 09/17/22 2322    Naaman Plummer, MD 09/17/22 [phone]

## 2022-10-22 ENCOUNTER — Inpatient Hospital Stay: Payer: Medicare Other | Attending: Nurse Practitioner | Admitting: Obstetrics and Gynecology

## 2022-10-22 VITALS — BP 128/65 | HR 71 | Temp 97.1°F | Resp 19 | Wt 203.0 lb

## 2022-10-22 DIAGNOSIS — B3731 Acute candidiasis of vulva and vagina: Secondary | ICD-10-CM

## 2022-10-22 DIAGNOSIS — Z8542 Personal history of malignant neoplasm of other parts of uterus: Secondary | ICD-10-CM

## 2022-10-22 DIAGNOSIS — Z90710 Acquired absence of both cervix and uterus: Secondary | ICD-10-CM | POA: Insufficient documentation

## 2022-10-22 DIAGNOSIS — Z90722 Acquired absence of ovaries, bilateral: Secondary | ICD-10-CM | POA: Insufficient documentation

## 2022-10-22 DIAGNOSIS — Z9221 Personal history of antineoplastic chemotherapy: Secondary | ICD-10-CM | POA: Insufficient documentation

## 2022-10-22 DIAGNOSIS — N898 Other specified noninflammatory disorders of vagina: Secondary | ICD-10-CM

## 2022-10-22 DIAGNOSIS — C541 Malignant neoplasm of endometrium: Secondary | ICD-10-CM

## 2022-10-22 DIAGNOSIS — Z08 Encounter for follow-up examination after completed treatment for malignant neoplasm: Secondary | ICD-10-CM | POA: Diagnosis present

## 2022-10-22 LAB — WET PREP, GENITAL
Clue Cells Wet Prep HPF POC: NONE SEEN
Sperm: NONE SEEN
Trich, Wet Prep: NONE SEEN
WBC, Wet Prep HPF POC: <10 (ref <10)
Yeast Wet Prep HPF POC: NONE SEEN

## 2022-10-22 NOTE — Progress Notes (Signed)
Gynecologic Oncology Interval Visit   Referring Provider: Dr. Amalia Hailey  Chief Complaint: Stage IIIC-1 serous endometrial cancer  Subjective:  Sandra Fischer is a 71 y.o. G2P0 female (s/p laparotomy myomectomy for leiomyoma), initially seen in consultation from Dr. 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   Has some vaginal discharge.  Some odor.  Uses diflucan and yeast cream intermittently.  Recently started Enbrel for RA and continues on Prednisone 5 mg.   CA 125 08/01/22  11.6 01/13/22 11.1 07/08/21 11.4 01/02/21  12.6 09/10/20 11.2 07/03/20  13.6  CT scan 11/22 IMPRESSION: 1. Stable examination without new or progressive findings in the chest abdomen or pelvis. 2. Stable benign small pulmonary nodules. 3. Left-sided colonic diverticulosis without findings of acute diverticulitis. 4. Aortic Atherosclerosis (ICD10-I70.0).    Gynecologic Oncology History:  She initially presented to Dr. Amalia Hailey as [phone] female with complaint of postmenopausal bleeding, intermittent, heavy at times.    Transabdominal ultrasound on 05/05/2019 demonstrated endometrium measuring 7m. Uterus anteverted measuring 11.3 x 6.6 x 7.9 cm, heterogeous echo texture w/o evidence of focal masses. Within uterus multiple suspected fibroids measuring 7.2 x 4.8 x 6.6 cm and 2.6 x 1.7 x 3.1 cm. Ovaries not visualized. No adnexal masses. No free fluid in cul de sac.   Endometrial biopsy was performed on 05/20/2019 and demonstrated high-grade mixed endometrioid, predominantly, and serous carcinoma.  She complains of persistent bleeding and fatigue which she attributes to the bleeding. She presents today for management. She has significant medical issues including rheumatoid arthritis requiring chronic steroids (5-10 mg daily) for a long time (she does not know how long) and leflunomide (ARAVA). She also has undergone  myomectomy for leiomyoma and abdominoplasty.  She also has a history of cardiac disease. She Cardioversion for SVT on 10/17/2018. Her cardiologist Dr. [name] [facility] for chest pain and shortness of breath, noted to be in SVT, converted to sinus rhythm with adenosine, in the setting of colitis, anemia with hemoglobin 8.5, and untreated hypothyroidism with TSH 25, which has since returned to normal range.  2D echocardiogram revealed moderately reduced LV function with LVEF 35 to 40% with diffuse hypokinesis. The patient underwent Lexiscan Myoview on 01/19/2019, which revealed revealed LVEF 49%, a mild fixed inferior wall defect, scar versus artifact, with no evidence of ischemia. She was last seen on 04/26/2019 by Cardiology ([name] with a plan to stay on her current medications and she was counseled about low sodium diet, DASH, and continuing hyperlipidemia medications.   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 She is married and provides care for her mother.   06/14/2019 CT C/A/P IMPRESSION: 1. Bulky fibroid uterus. There is no direct noncontrast CT evidence of endometrial malignancy. No evidence of lymphadenopathy or metastatic disease in the chest, abdomen, or pelvis. Please note that noncontrast CT is limited for the evaluation of solid organ metastases. 2. Stable 4 mm ground-glass pulmonary nodule of the left pulmonary apex (series 3, image 24). Although nonspecific, this is an unlikely isolated manifestation of metastatic disease. Attention on follow-up.  3. Chronic, incidental, and postoperative findings as detailed above.  She underwent TLH_BSO with [name] 06/21/2019.   Tumor size 9.3 cm, invading 99% of myometrium (3.55 of 3.6 cm), positive right external iliac sentinel lymph node. Negative washings. MSS/MMR - Intact/normal. HER2 - negative at 28%/1+.   Case was discussed at [facility] on 07/04/2019. Possible PORTEC3 regimen given  subgroup analysis showing benefit in serous history vs clinical trial was discussed.   Based on pathology, adjuvant chemotherapy & radiation was recommended. She saw Dr. [name] on 07/14/2019 and lengthy discussion regarding rationale of adjuvant treatment. She declined adjuvant treatment.  10/20/2019- CT Chest Abdomen Pelvis W Contrast multiple newly enlarged retroperitoneal and right iliac lymph nodes, the largest left retroperitoneal node measuring 1.9 x 1.6 cm (series 2, image 71), concerning for nodal metastatic disease. 3. Unchanged 4 mm ground-glass pulmonary nodule of the left pulmonary apex (series 3, image 24). This remains nonspecific although is again an unlikely manifestation of pulmonary metastatic disease.  Findings consistent with recurrent disease with adenopathy.   11/07/2019-02/20/20 She agreed to treatment and received 6 cycles of carbo-taxol chemotherapy  (dose reduced d/t neuropathy).   03/12/2020 CT Abdomen/pelvis The multiple enlarged retroperitoneal and right iliac lymph nodes identified as new on the previous exam from 10/20/2019 have resolved completely in the interval. 2. Tiny ground-glass pulmonary nodule medial left lung apex is stable in the interval. Likely benign, continued attention on follow-up recommended.  Data from GOG 258 was discussed including no differences in relapse free survival, fewer lower vaginal recurrences and fewer lower pelvic and para-aortic relapsed with the addition of RT. She was seen by Dr. [name] who did not recommend WPRT.   Post chemotherapy CT showed excellent response. Previously enlarged retroperitoneal and right iliac nodes have resolved. Adjuvant radiation was not recommended.   CT 02/14/21 chest IMPRESSION: 1. Left apical 4 mm ground-glass nodule is unchanged, considered benign. 2. A right lower lobe solid 3 mm nodule is also not significantly changed and can be presumed benign. 3.  No acute process or evidence of metastatic disease in  the chest. 4.  Aortic Atherosclerosis (ICD10-I70.0). 5. Mild hepatic steatosis. 6.  Tiny hiatal hernia.  Diagnosed with shingles on right trunk treated with Valtrex.   Problem List: Patient Active Problem List   Diagnosis Date Noted   Age-related osteoporosis without current pathological fracture 04/22/2022   Diabetes mellitus without complication ([place]) 40/98/1191   Nuclear sclerotic cataract of right eye 04/03/2021   Severe persistent asthma 04/03/2021   Problem related to unspecified psychosocial circumstances 04/03/2021   Corn of foot 04/03/2021   Dependence on continuous positive airway pressure ventilation 04/03/2021   Dermatophytosis of nail 04/03/2021   Type 2 or unspecified type diabetes mellitus 04/03/2021   Disorder of bone and cartilage 04/03/2021   Dysphagia 04/03/2021   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   History of recurrent pneumonia 04/03/2021   Irritable colon 04/03/2021   Low back pain 04/03/2021   Other alveolar and parietoalveolar pneumonopathy 04/03/2021   Other and unspecified hyperlipidemia 04/03/2021   Other specified disease of nail 04/03/2021   Pain in joint, ankle and foot 04/03/2021   Polyp of colon 04/03/2021   Seborrheic dermatitis 04/03/2021   SVT (supraventricular tachycardia) 04/03/2021   Vitiligo 04/03/2021   Vocal cord dysfunction 04/03/2021   Obesity 04/03/2021   Allergic asthma 04/03/2021   Obstructive sleep apnea 04/03/2021   Encounter for long-term (current) use of steroids 04/03/2021   Diabetic neuropathy ([place]) 08/02/2020   Iron deficiency anemia 07/03/2020   Vaginal discharge 05/30/2020   Encounter for antineoplastic chemotherapy 01/30/2020   Neuropathy due to chemotherapeutic drug ([place]) 01/30/2020   Anemia 01/30/2020   Gastroesophageal reflux disease 12/08/2019   Microcytic anemia 11/07/2019   Port-A-Cath in place 11/04/2019   Rheumatoid arthritis ([place]) 10/29/2019   Goals of care, counseling/discussion  07/20/2019    Endometrial cancer ([place]) 07/15/2019   Acute blood loss anemia 06/22/2019   Hypothyroidism, adult 06/15/2019   Myalgia and myositis 06/15/2019   Fibromyalgia    Cardiomyopathy ([place]) 12/06/2018   Atypical chest pain 12/06/2018   Perennial and seasonal allergic rhinitis 02/09/2018   Moderate persistent asthma 02/09/2018   Allergic conjunctivitis 02/09/2018   History of food allergy 02/09/2018   Allergic reaction 02/09/2018   RA (rheumatoid arthritis) ([place]) 08/22/2016   HTN (hypertension) 08/22/2016    Past Medical History: Past Medical History:  Diagnosis Date   Asthma    Asthma    Asthma exacerbation 08/22/2016   Cancer ([place])    Collagen vascular disease ([place])    Diabetes mellitus without complication ([place])    History of Diabetes    Dyspnea    Environmental allergies    Fibromyalgia    High cholesterol    Hypertension    Hypothyroidism    Iron deficiency anemia 07/03/2020   Neuropathy    RA (rheumatoid arthritis) ([place])    Sleep apnea    Thyroid disease     Past Surgical History: Past Surgical History:  Procedure Laterality Date   ABDOMINAL HYSTERECTOMY     CATARACT EXTRACTION     CHOLECYSTECTOMY     COLONOSCOPY WITH PROPOFOL N/A 03/14/2022   Procedure: COLONOSCOPY WITH PROPOFOL;  Surgeon: Lesly Rubenstein, MD;  Location: [facility] ENDOSCOPY;  Service: Endoscopy;  Laterality: N/A;  DM   CYSTOURETHROSCOPY     DIAGNOSTIC LAPAROSCOPY     EYE SURGERY     fibroids removed     PORTA CATH REMOVAL N/A 02/14/2021   Procedure: PORTA CATH REMOVAL;  Surgeon: Algernon Huxley, MD;  Location: [facility];  Service: Cardiovascular;  Laterality: N/A;   THYROIDECTOMY, PARTIAL     tummy tuck      Past Gynecologic History:  As per HPI  OB History:  OB History  Gravida Para Term Preterm AB Living  2       2    SAB IAB Ectopic Multiple Live Births  2            # Outcome Date GA Lbr Len/2nd Weight Sex Delivery Anes PTL Lv  2 SAB           1 SAB             Family  History: Family History  Problem Relation Age of Onset   Diabetes Mother    Hypertension Mother    Hyperlipidemia Mother    Dementia Mother    Breast cancer Neg Hx    Ovarian cancer Neg Hx    Colon cancer Neg Hx     Social History: Social History   Socioeconomic History   Marital status: Married    Spouse name: Cedric    Number of children: Not on file   Years of education: Not on file   Highest education level: Not on file  Occupational History   Occupation: Retired  Tobacco Use   Smoking status: Former    Packs/day: 0.10    Years: 0.25    Total pack years: 0.03    Types: Cigarettes    Quit date: 1980    Years since quitting: 43.8   Smokeless tobacco: Never  Vaping Use   Vaping Use: Never used  Substance and Sexual Activity   Alcohol use: No   Drug use: No   Sexual activity: Yes    Birth control/protection: Post-menopausal  Other Topics  Concern   Not on file  Social History Narrative   Not on file   Social Determinants of Health   Financial Resource Strain: Not on file  Food Insecurity: Not on file  Transportation Needs: Not on file  Physical Activity: Not on file  Stress: Not on file  Social Connections: Not on file  Intimate Partner Violence: Not on file   Immunization History  Administered Date(s) Administered   Fluad Quad(high Dose 65+) 11/04/2021   Influenza, High Dose Seasonal PF 10/03/2019   Influenza, Seasonal, Injecte, Preservative Fre 03/01/2013, 09/23/2013, 01/21/2016   Influenza,inj,Quad PF,6+ Mos 10/22/2016, 12/03/2017   Influenza-Unspecified 10/29/2000, 12/29/2000, 10/26/2008, 10/29/2008, 01/27/2012   PFIZER Comirnaty(Gray Top)Covid-19 Tri-Sucrose Vaccine 01/08/2021   PFIZER(Purple Top)SARS-COV-2 Vaccination 04/19/2020, 05/10/2020   Pneumococcal Conjugate-13 09/23/2013   Pneumococcal Polysaccharide-23 12/29/2000     Allergies: Allergies  Allergen Reactions   Abatacept Hives   Codeine Hives, Nausea And Vomiting and Nausea Only    Iodine Swelling   Latex Itching, Hives and Rash   Shellfish Allergy Swelling   Trimethoprim Hydrochloride [Trimethoprim] Other (See Comments)    Pancreatitis   Penicillins Nausea And Vomiting    Current Medications: Current Outpatient Medications  Medication Sig Dispense Refill   Acetaminophen (TYLENOL EXTRA STRENGTH PO) Take 1,300 mg by mouth 2 (two) times daily as needed.     albuterol (ACCUNEB) 1.25 MG/3ML nebulizer solution Take 1 ampule by nebulization every 6 (six) hours as needed for wheezing.     albuterol (PROVENTIL HFA;VENTOLIN HFA) 108 (90 Base) MCG/ACT inhaler Inhale 2 puffs into the lungs every 6 (six) hours as needed for wheezing or shortness of breath. 1 Inhaler 0   ascorbic acid (VITAMIN C) 500 MG tablet Take 500 mg by mouth daily.     aspirin EC 81 MG tablet Take 81 mg by mouth daily.     azelastine (ASTELIN) 0.1 % nasal spray Place 2 sprays into both nostrils 2 (two) times daily. 30 mL 5   benzonatate (TESSALON) 200 MG capsule Take 200 mg by mouth 3 (three) times daily as needed.     Calcium Carbonate-Vit D-Min (CALCIUM 600+D PLUS MINERALS) 600-400 MG-UNIT TABS Take 1 tablet by mouth 2 (two) times daily.     cetirizine (ZYRTEC) 10 MG tablet Take 1 tablet (10 mg total) by mouth daily. 30 tablet 0   Cholecalciferol (D3 ADULT PO) Take 1 capsule by mouth daily.     clotrimazole (GYNE-LOTRIMIN) 1 % vaginal cream Place 1 Applicatorful vaginally daily.     clotrimazole-betamethasone (LOTRISONE) cream Apply topically 4 (four) times daily as needed.     desloratadine (CLARINEX) 5 MG tablet Take 5 mg by mouth daily.     docusate sodium (COLACE) 50 MG capsule Take 50 mg by mouth 2 (two) times daily.     EPINEPHrine 0.3 mg/0.3 mL IJ SOAJ injection Use as directed for severe allergic reaction. 2 Device 1   FEROSUL 325 (65 Fe) MG tablet TAKE ONE TABLET BY MOUTH TWICE DAILY WITH A MEAL 60 tablet 1   fluticasone (FLONASE) 50 MCG/ACT nasal spray Place 1 spray into both nostrils 2 (two)  times daily. 16 g 0   fluticasone-salmeterol (ADVAIR) 250-50 MCG/ACT AEPB Inhale 1 puff into the lungs in the morning and at bedtime.     furosemide (LASIX) 10 MG/ML solution Take by mouth daily.     gabapentin (NEURONTIN) 300 MG capsule Take 1 capsule (300 mg total) by mouth 3 (three) times daily. 90 capsule 1   hydrALAZINE (APRESOLINE) 25  MG tablet Take 25 mg by mouth 2 (two) times daily.     indomethacin (INDOCIN) 50 MG capsule Take 50 mg by mouth in the morning, at noon, in the evening, and at bedtime.     ipratropium (ATROVENT) 0.02 % nebulizer solution Take 0.5 mg by nebulization every 4 (four) hours as needed.      leflunomide (ARAVA) 20 MG tablet Take 20 mg by mouth daily.     leflunomide (ARAVA) 20 MG tablet Take 1 tablet by mouth daily.     levothyroxine (SYNTHROID) 88 MCG tablet Take by mouth.     levothyroxine (SYNTHROID, LEVOTHROID) 88 MCG tablet Take 88 mcg by mouth daily before breakfast.     lisinopril (PRINIVIL,ZESTRIL) 40 MG tablet Take 20 mg by mouth in the morning and at bedtime.      lisinopril (ZESTRIL) 40 MG tablet Take by mouth.     metoprolol succinate (TOPROL-XL) 50 MG 24 hr tablet Take 50 mg by mouth daily. Take with or immediately following a meal.     metroNIDAZOLE (METROGEL VAGINAL) 0.75 % vaginal gel Insert 5 grams (37.5 mg of metronidazole) of gel into vagina once daily at bedtime for five days 70 g 0   montelukast (SINGULAIR) 10 MG tablet Take 10 mg by mouth at bedtime.     nitroGLYCERIN (NITROSTAT) 0.4 MG SL tablet Place under the tongue. Place 1 tablet (0.4 mg total) under the tongue every 5 (five) minutes as needed for Chest pain May take up to 3 doses.     nortriptyline (PAMELOR) 25 MG capsule Take 25 mg by mouth at bedtime.     olopatadine (PATANOL) 0.1 % ophthalmic solution Place 1 drop into both eyes 2 (two) times daily. 5 mL 5   omeprazole (PRILOSEC) 10 MG capsule Take 20 mg by mouth daily.     pravastatin (PRAVACHOL) 40 MG tablet Take 40 mg by mouth  daily.     predniSONE (DELTASONE) 50 MG tablet Take 1 tablet (50 mg total) by mouth daily with breakfast. 5 tablet 0   Prenatal Vit-Fe Fumarate-FA (MULTIVITAMIN-PRENATAL) 27-0.8 MG TABS tablet Take 1 tablet by mouth daily.      senna-docusate (SENOKOT-S) 8.6-50 MG tablet Take 1 tablet by mouth at bedtime as needed for mild constipation.     tiotropium (SPIRIVA) 18 MCG inhalation capsule Place 18 mcg into inhaler and inhale daily.     traMADol (ULTRAM) 50 MG tablet Take 100 mg by mouth as needed (for rheumatoid arthritis).     verapamil (CALAN) 120 MG tablet Take 240 mg by mouth 2 (two) times daily.     No current facility-administered medications for this visit.   Review of Systems General:  no complaints Skin: no complaints Eyes: no complaints HEENT: no complaints Breasts: no complaints Pulmonary: no complaints Cardiac: no complaints Gastrointestinal: no complaints Genitourinary/Sexual: no complaints Musculoskeletal: no complaints Hematology: no complaints Neurologic/Psych: no complaints   Objective:  Physical Examination:  BP 128/65 (Patient Position: Sitting)   Pulse 71   Temp (!) 97.1 F (36.2 C)   Resp 19   Wt 203 lb (92.1 kg)   SpO2 98%   BMI 37.13 kg/m     ECOG Performance Status: 1 - Symptomatic but completely ambulatory   GENERAL: Patient is a well appearing female in no acute distress. Arrived in wheelchair with husband. HEENT:  Sclera clear. Anicteric NODES:  Negative axillary, supraclavicular, inguinal lymph node survery LUNGS:  Clear to auscultation bilaterally.   HEART:  Regular rate and rhythm.  ABDOMEN:  Soft, nontender.  No hernias, incisions well healed. No masses or ascites EXTREMITIES:  No peripheral edema. Atraumatic. No cyanosis SKIN:  Clear with no obvious rashes or skin changes.  NEURO:  Nonfocal. Well oriented.  Appropriate affect.  Pelvic: exam chaperoned by CMA EGBUS: no lesions Cervix: surgically absent Vagina: no lesions, or bleeding,  Slight watery gray discharge.  Uterus: surgically absent BME: no palpable masses Rectovaginal: confirmatory  Labs: No labs on site today  Radiologic Imaging:  No imaging on site today    Assessment:  Sandra Fischer is a 71 y.o. 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  Tumor size 9.3 cm, invading 99% of myometrium (3.55 of 3.6cm), positive right external iliac sentinel lymph nodes. Negative washings. Recurrent disease 10/20/2019 with adenopathy up to 1.9 x 1.6 cm with CR to chemotherapy with paclitaxel/carboplatin completed 01/2020.  Normal exam today.   CA125 normal in 8/23, but was not elevated at recurrence.   CT scan chest 2/22 no change in indeterminate nodules. Pulmonary nodule- likely benign based on stability on imaging.  CT C/A/P 11/22 stable.  Grade 2 peripheral neuropathy.    Vaginal discharge. Yeast infections.  Patient had COVID19 in 10/22.    Medical co-morbidities complicating care: She has significant medical issues including rheumatoid arthritis requiring chronic steroids (5-10 mg daily) for a long time (she does not know how long) and leflunomide (ARAVA); HTN; Dilated cardiomyopathy; hyperlipidemia; multiple intra-abdominal surgeries (myomectomy for leiomyoma, cholecystectomy, and abdominoplasty) ; obesity There is no height or weight on file to calculate BMI.  Plan:   Problem List Items Addressed This Visit       Genitourinary   Endometrial cancer ([place]) - Primary   Sending vaginal discharge for wet prep to rule out BV or Trich.      Will order surveillance CT scan C/A/P in view of her high risk of recurrence.   Continue follow up with Dr. [name] and Gyn Onc q42mo    Pulmonary nodule unchanged and felt to be benign. No additional surveillance recommended.   LBeckey Rutter DNP, AGNP-C CLyndonat  [phone](clinic)   I personally interviewed and examined the patient. Agreed with the above/below plan of care. I have directly contributed to assessment and plan of care of this patient and educated and discussed with patient and family.  AMellody Drown MD

## 2022-10-27 ENCOUNTER — Encounter (INDEPENDENT_AMBULATORY_CARE_PROVIDER_SITE_OTHER): Payer: Self-pay

## 2022-10-29 ENCOUNTER — Ambulatory Visit
Admission: RE | Admit: 2022-10-29 | Discharge: 2022-10-29 | Disposition: A | Discharge status: Home / Self Care | Payer: Medicare Other | Source: Ambulatory Visit | Attending: Nurse Practitioner | Admitting: Nurse Practitioner

## 2022-10-29 DIAGNOSIS — C541 Malignant neoplasm of endometrium: Secondary | ICD-10-CM | POA: Insufficient documentation

## 2022-11-03 ENCOUNTER — Telehealth: Payer: Self-pay

## 2022-11-03 NOTE — Telephone Encounter (Signed)
Spoke to Sandra Fischer and provided results of CT scan. She will call and get an appointment with Dr. Haig Prophet at [facility] to follow up on liver finding.

## 2022-11-03 NOTE — Telephone Encounter (Signed)
Called and left voicemail with Sandra Fischer to return call to review results of CT. For liver finding, per Dr. Haig Prophet, she can  follow-up with [facility] to talk about doing elastography at some point. We will make her aware of this.

## 2022-11-06 ENCOUNTER — Encounter: Payer: Self-pay | Admitting: Podiatry

## 2022-11-06 ENCOUNTER — Ambulatory Visit (INDEPENDENT_AMBULATORY_CARE_PROVIDER_SITE_OTHER): Payer: Medicare Other | Admitting: Podiatry

## 2022-11-06 DIAGNOSIS — T451X5A Adverse effect of antineoplastic and immunosuppressive drugs, initial encounter: Secondary | ICD-10-CM

## 2022-11-06 DIAGNOSIS — B351 Tinea unguium: Secondary | ICD-10-CM | POA: Diagnosis not present

## 2022-11-06 DIAGNOSIS — G620 Drug-induced polyneuropathy: Secondary | ICD-10-CM | POA: Diagnosis not present

## 2022-11-06 DIAGNOSIS — M79675 Pain in left toe(s): Secondary | ICD-10-CM | POA: Diagnosis not present

## 2022-11-06 DIAGNOSIS — M79674 Pain in right toe(s): Secondary | ICD-10-CM

## 2022-11-06 DIAGNOSIS — E1142 Type 2 diabetes mellitus with diabetic polyneuropathy: Secondary | ICD-10-CM | POA: Diagnosis not present

## 2022-11-06 NOTE — Progress Notes (Signed)
This patient returns to my office for at risk foot care.  This patient requires this care by a professional since this patient will be at risk due to having diet controlled diabetes.   This patient is unable to cut nails herself since the patient cannot reach her nails.These nails are painful walking and wearing shoes.  This patient presents for at risk foot care today.  General Appearance  Alert, conversant and in no acute stress.  Vascular  Dorsalis pedis and posterior tibial  pulses are palpable  bilaterally.  Capillary return is within normal limits  bilaterally. Temperature is within normal limits  bilaterally.  Neurologic  Senn-Weinstein monofilament wire test WNL  bilaterally. Muscle power within normal limits bilaterally.  Nails Thick disfigured discolored nails with subungual debris  from hallux to fifth toes bilaterally. No evidence of bacterial infection or drainage bilaterally.  Orthopedic  No limitations of motion  feet .  No crepitus or effusions noted.  No bony pathology or digital deformities noted.  HAV  B/L.  Skin  normotropic skin with no porokeratosis noted bilaterally.  No signs of infections or ulcers noted.     Onychomycosis  Pain in right toes  Pain in left toes  Diabetes    Consent was obtained for treatment procedures.   Mechanical debridement of nails 1-5  bilaterally performed with a nail nipper.  Filed with dremel without incident.     Return office visit   10 weeks                  Told patient to return for periodic foot care and evaluation due to potential at risk complications.   Gardiner Barefoot DPM

## 2022-12-04 ENCOUNTER — Telehealth: Payer: Self-pay

## 2022-12-04 NOTE — Telephone Encounter (Signed)
Patient called in stating she believes she has a Yeast infection or BV due to her being on different antibiotics. She wants to know if this is an issues we will take care of or should she reach out to a different gyn or pcp.  Callback # [phone].

## 2022-12-05 NOTE — Telephone Encounter (Signed)
Patient informed to call PCP or OB. Patient verbalized understanding.

## 2023-01-22 ENCOUNTER — Encounter: Payer: Self-pay | Admitting: Podiatry

## 2023-01-22 ENCOUNTER — Ambulatory Visit (INDEPENDENT_AMBULATORY_CARE_PROVIDER_SITE_OTHER): Payer: Medicare Other | Admitting: Podiatry

## 2023-01-22 VITALS — BP 155/80 | HR 72

## 2023-01-22 DIAGNOSIS — B351 Tinea unguium: Secondary | ICD-10-CM | POA: Diagnosis not present

## 2023-01-22 DIAGNOSIS — E1142 Type 2 diabetes mellitus with diabetic polyneuropathy: Secondary | ICD-10-CM | POA: Diagnosis not present

## 2023-01-22 DIAGNOSIS — M79674 Pain in right toe(s): Secondary | ICD-10-CM | POA: Diagnosis not present

## 2023-01-22 DIAGNOSIS — M79675 Pain in left toe(s): Secondary | ICD-10-CM

## 2023-01-22 NOTE — Progress Notes (Signed)
This patient returns to my office for at risk foot care.  This patient requires this care by a professional since this patient will be at risk due to having diet controlled diabetes.   This patient is unable to cut nails herself since the patient cannot reach her nails.These nails are painful walking and wearing shoes.  This patient presents for at risk foot care today.  General Appearance  Alert, conversant and in no acute stress.  Vascular  Dorsalis pedis and posterior tibial  pulses are palpable  bilaterally.  Capillary return is within normal limits  bilaterally. Temperature is within normal limits  bilaterally.  Neurologic  Senn-Weinstein monofilament wire test WNL  bilaterally. Muscle power within normal limits bilaterally.  Nails Thick disfigured discolored nails with subungual debris  from hallux to fifth toes bilaterally. No evidence of bacterial infection or drainage bilaterally.  Orthopedic  No limitations of motion  feet .  No crepitus or effusions noted.  No bony pathology or digital deformities noted.  HAV  B/L.  Skin  normotropic skin with no porokeratosis noted bilaterally.  No signs of infections or ulcers noted.     Onychomycosis  Pain in right toes  Pain in left toes  Diabetes    Consent was obtained for treatment procedures.   Mechanical debridement of nails 1-5  bilaterally performed with a nail nipper.  Filed with dremel without incident.     Return office visit   10 weeks                  Told patient to return for periodic foot care and evaluation due to potential at risk complications.   Gardiner Barefoot DPM

## 2023-02-04 ENCOUNTER — Inpatient Hospital Stay: Payer: Medicare Other | Attending: Oncology

## 2023-02-04 DIAGNOSIS — Z7952 Long term (current) use of systemic steroids: Secondary | ICD-10-CM | POA: Insufficient documentation

## 2023-02-04 DIAGNOSIS — T451X5A Adverse effect of antineoplastic and immunosuppressive drugs, initial encounter: Secondary | ICD-10-CM | POA: Insufficient documentation

## 2023-02-04 DIAGNOSIS — M069 Rheumatoid arthritis, unspecified: Secondary | ICD-10-CM | POA: Diagnosis not present

## 2023-02-04 DIAGNOSIS — Z7982 Long term (current) use of aspirin: Secondary | ICD-10-CM | POA: Diagnosis not present

## 2023-02-04 DIAGNOSIS — Z79899 Other long term (current) drug therapy: Secondary | ICD-10-CM | POA: Diagnosis not present

## 2023-02-04 DIAGNOSIS — C541 Malignant neoplasm of endometrium: Secondary | ICD-10-CM

## 2023-02-04 DIAGNOSIS — G620 Drug-induced polyneuropathy: Secondary | ICD-10-CM | POA: Diagnosis not present

## 2023-02-04 DIAGNOSIS — Z8542 Personal history of malignant neoplasm of other parts of uterus: Secondary | ICD-10-CM | POA: Diagnosis present

## 2023-02-04 LAB — COMPREHENSIVE METABOLIC PANEL
ALT: 16 U/L (ref 0–44)
AST: 22 U/L (ref 15–41)
Albumin: 3.9 g/dL (ref 3.5–5.0)
Alkaline Phosphatase: 80 U/L (ref 38–126)
Anion gap: 9 (ref 5–15)
BUN: 14 mg/dL (ref 8–23)
CO2: 25 mmol/L (ref 22–32)
Calcium: 9.3 mg/dL (ref 8.9–10.3)
Chloride: 104 mmol/L (ref 98–111)
Creatinine, Ser: 0.75 mg/dL (ref 0.44–1.00)
GFR, Estimated: >60 mL/min (ref >60)
Glucose, Bld: 125 mg/dL — ABNORMAL HIGH (ref 70–99)
Potassium: 3.8 mmol/L (ref 3.5–5.1)
Sodium: 138 mmol/L (ref 135–145)
Total Bilirubin: 0.2 mg/dL — ABNORMAL LOW (ref 0.3–1.2)
Total Protein: 8.3 g/dL — ABNORMAL HIGH (ref 6.5–8.1)

## 2023-02-04 LAB — CBC WITH DIFFERENTIAL/PLATELET
Abs Immature Granulocytes: 0.02 10*3/uL (ref 0.00–0.07)
Basophils Absolute: 0 10*3/uL (ref 0.0–0.1)
Basophils Relative: 1 %
Eosinophils Absolute: 0.6 10*3/uL — ABNORMAL HIGH (ref 0.0–0.5)
Eosinophils Relative: 8 %
HCT: 40.8 % (ref 36.0–46.0)
Hemoglobin: 12.4 g/dL (ref 12.0–15.0)
Immature Granulocytes: 0 %
Lymphocytes Relative: 44 %
Lymphs Abs: 3.6 10*3/uL (ref 0.7–4.0)
MCH: 26.1 pg (ref 26.0–34.0)
MCHC: 30.4 g/dL (ref 30.0–36.0)
MCV: 85.9 fL (ref 80.0–100.0)
Monocytes Absolute: 0.9 10*3/uL (ref 0.1–1.0)
Monocytes Relative: 11 %
Neutro Abs: 2.9 10*3/uL (ref 1.7–7.7)
Neutrophils Relative %: 36 %
Platelets: 272 10*3/uL (ref 150–400)
RBC: 4.75 MIL/uL (ref 3.87–5.11)
RDW: 14.7 % (ref 11.5–15.5)
WBC: 8.1 10*3/uL (ref 4.0–10.5)
nRBC: 0 % (ref 0.0–0.2)

## 2023-02-06 LAB — CA 125: Cancer Antigen (CA) 125: 15.6 U/mL (ref 0.0–38.1)

## 2023-02-09 ENCOUNTER — Inpatient Hospital Stay (HOSPITAL_BASED_OUTPATIENT_CLINIC_OR_DEPARTMENT_OTHER): Payer: Medicare Other | Admitting: Oncology

## 2023-02-09 ENCOUNTER — Encounter: Payer: Self-pay | Admitting: Oncology

## 2023-02-09 VITALS — BP 130/70 | HR 71 | Temp 99.4°F | Resp 18 | Wt 196.2 lb

## 2023-02-09 DIAGNOSIS — R911 Solitary pulmonary nodule: Secondary | ICD-10-CM

## 2023-02-09 DIAGNOSIS — M069 Rheumatoid arthritis, unspecified: Secondary | ICD-10-CM

## 2023-02-09 DIAGNOSIS — C541 Malignant neoplasm of endometrium: Secondary | ICD-10-CM

## 2023-02-09 DIAGNOSIS — G620 Drug-induced polyneuropathy: Secondary | ICD-10-CM | POA: Diagnosis not present

## 2023-02-09 DIAGNOSIS — Z8542 Personal history of malignant neoplasm of other parts of uterus: Secondary | ICD-10-CM | POA: Diagnosis not present

## 2023-02-09 DIAGNOSIS — T451X5A Adverse effect of antineoplastic and immunosuppressive drugs, initial encounter: Secondary | ICD-10-CM

## 2023-02-09 NOTE — Progress Notes (Signed)
Hematology/Oncology Progress note [phone] [phone]  CHIEF COMPLAINTS/REASON FOR VISIT:   ASSESSMENT & PLAN:   Endometrial cancer ([place]) Recurrent endometrial cancer Status post 6 cycles of chemotherapy with carboplatin and Taxol. Post chemotherapy CT scan showed excellent response and resolution of lymphadenopathy. 11/15/ 2022, CT scan showed no disease progression or recurrence. 04/05/2022, CT angiogram chest PE program showed no PE, peripheral bronchi secondary suggesting of acute bronchitis.  No evidence of consolidating pneumonia or pulmonary edema. Labs are reviewed and discussed with patient. CA125 has been stable. Obtain CT chest abdomen pelvis w contrast in May 2024 Continue alternate follow-up with GYN oncology and Oncology every 6 months.  Neuropathy due to chemotherapeutic drug ([place]) #Neuropathy, suspect possibly be secondary to chemotherapy.  She is on gabapentin 300 mg 3 times daily  Rheumatoid arthritis ([place]) follow-up rheumatologist. - off Prednisone, now on Enbrel.  Lung nodule Continue CT surveillance.   Orders Placed This Encounter  Procedures   CT CHEST ABDOMEN PELVIS W CONTRAST    Standing Status:   Future    Standing Expiration Date:   02/10/2024    Scheduling Instructions:     To be done in May 2024,  a few days prior to seeing Dr. [name]    Order Specific Question:   If indicated for the ordered procedure, I authorize the administration of contrast media per Radiology protocol    Answer:   Yes    Order Specific Question:   Preferred imaging location?    Answer:   [facility]    Order Specific Question:   Is Oral Contrast requested for this exam?    Answer:   Yes, Per Radiology protocol   CBC with Differential/Platelet    Standing Status:   Future    Standing Expiration Date:   02/10/2024   Comprehensive metabolic panel    Standing Status:   Future    Standing Expiration Date:   02/09/2024   CA  125    Standing Status:   Future    Standing Expiration Date:   02/09/2024    All questions were answered. The patient knows to call the clinic with any problems, questions or concerns.  Earlie Server, MD, PhD [facility] Hematology Oncology 02/09/2023   Follow up for endometrial cancer HISTORY OF PRESENTING ILLNESS:   Sandra Fischer is a  72 y.o.  female with PMH listed below was seen in consultation at the request of  [facility]  for evaluation of endometrial cancer Patient had TH/BSO sentinel lymph node biopsy by [facility] G[facility] oncology Dr. Allen Norris on 06/21/2019. Extensive medical records from [place] system review was performed by me.  Tumor size 9.3 cm, invading 99% of myometrium [3.55 out of 3.6 cm], positive right external iliac sentinel lymph nodes.  Negative washing.  No LVI, serosa is uninvolved. 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 pT1bpN6m  FIGO stage IIIC1 MSI intact HER 2 negative.   Patient was seen by Dr. BFransisca Connorson 07/06/2019 due to vaginal odor. Patient presents for establishing care and discussion for adjuvant chemotherapy radiation.  Per Dr. BBlake Divinenote[facility]  Portex 3 regimen was recommended given subgroup analysis showing benefit in the serous histology versus clinical trial. HER2 was ordered and pending results.   Patient has a history of SVT.  10/14/2018 2D echo reviewed moderately reduced LV function with LVEF 35 to 40% with diffuse hypokinesis.  Patient had Lexiscan Myoview on 01/19/2019.  LVEF 49%.  SPECT images revealed a mild fixed inferior wall defect, scar versus artifact.  No evidence of ischemia.  She follows up with cardiology Dr.Paraschos 04/26/2019 with a plan to stay on her current medication. 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 She is married and providing care for her mother.  # 10/20/2019 CT chest abdomen pelvis with contrast  showed disease recurrence.  There are multiple newly enlarged retroperitoneal and right iliac lymph nodes the largest the left retroperitoneal node measuring 1.9 x 1.6 cm concerning for nodal metastasis disease.  Unchanged 4 mm groundglass pulmonary nodule of the left pulmonary apex.  This remains nonspecific. #Cardiomyopathy, LVEF35 to 40% Last seen by cardiology 04/26/2019.  Recommend patient to continue follow-up with cardiology.  # GERD  ER visit on 11/13/2019 due to chest pain which started after he ate at a restaurant and developed burning sensation in her chest.  No associated dyspnea. Patient had a CTA which showed no pulmonary embolism.  Patient symptoms got better after GI cocktail.  Was considered to be secondary to acid reflux.  Patient takes omeprazole 20 mg daily.   # 11/03/2019 -02/20/2020 S/p 6 cycles of chemotherapy with carboplatin and Taxol. Postchemotherapy CT scan showed excellent treatment response.   I discussed with Dr.Secord, according to data from GOG 258,  there were no differences in relapse-free survival fewer lower vaginal recurrences and fewer lower pelvic and para-aortic relapses with the addition of RT  11/11/2021, CT chest abdomen pelvis showed stable examination without new/progressive diseases.  Stable benign small bilateral pulmonary nodules.  Left-sided colonic diverticulosis without findings of acute radiculitis.  Aortic atherosclerosis.  04/05/2022, CT angiogram chest PE program showed no PE, peripheral bronchi secondary suggesting of acute bronchitis.  No evidence of consolidating pneumonia or pulmonary edema.  INTERVAL HISTORY Sandra Fischer is a 72 y.o. female who has above history reviewed by me today presents for follow up visit for management of endometrial cancer. Patient has neuropathy, stable symptoms.  Rheumatoid arthritis, follows up with rheumatology, off Prednisone, now on Enbrel.   Review of Systems  Constitutional:  Positive for fatigue. Negative  for appetite change, chills and fever.  HENT:   Negative for hearing loss and voice change.   Eyes:  Negative for eye problems.  Respiratory:  Negative for chest tightness and cough.   Cardiovascular:  Negative for chest pain.  Gastrointestinal:  Negative for abdominal distention, abdominal pain and blood in stool.  Endocrine: Negative for hot flashes.  Genitourinary:  Negative for difficulty urinating and frequency.   Musculoskeletal:  Negative for arthralgias.  Skin:  Negative for itching and rash.  Neurological:  Positive for numbness. Negative for extremity weakness.  Hematological:  Negative for adenopathy.  Psychiatric/Behavioral:  Negative for confusion.     MEDICAL HISTORY:  Past Medical History:  Diagnosis Date   Asthma    Asthma    Asthma exacerbation 08/22/2016   Cancer ([place])    Collagen vascular disease ([place])    Diabetes mellitus without complication ([place])    History of Diabetes    Dyspnea    Environmental allergies    Fibromyalgia    High cholesterol    Hypertension    Hypothyroidism    Iron deficiency anemia 07/03/2020   Neuropathy    RA (rheumatoid arthritis) ([place])    Sleep apnea    Thyroid disease     SURGICAL HISTORY: Past Surgical History:  Procedure Laterality Date   ABDOMINAL HYSTERECTOMY  CATARACT EXTRACTION     CHOLECYSTECTOMY     COLONOSCOPY WITH PROPOFOL N/A 03/14/2022   Procedure: COLONOSCOPY WITH PROPOFOL;  Surgeon: Lesly Rubenstein, MD;  Location: [facility] ENDOSCOPY;  Service: Endoscopy;  Laterality: N/A;  DM   CYSTOURETHROSCOPY     DIAGNOSTIC LAPAROSCOPY     EYE SURGERY     fibroids removed     PORTA CATH REMOVAL N/A 02/14/2021   Procedure: PORTA CATH REMOVAL;  Surgeon: Algernon Huxley, MD;  Location: [facility];  Service: Cardiovascular;  Laterality: N/A;   THYROIDECTOMY, PARTIAL     tummy tuck      SOCIAL HISTORY: Social History   Socioeconomic History   Marital status: Married    Spouse name: Cedric    Number of  children: Not on file   Years of education: Not on file   Highest education level: Not on file  Occupational History   Occupation: Retired  Tobacco Use   Smoking status: Former    Packs/day: 0.10    Years: 0.25    Total pack years: 0.03    Types: Cigarettes    Quit date: 1980    Years since quitting: 44.1   Smokeless tobacco: Never  Vaping Use   Vaping Use: Never used  Substance and Sexual Activity   Alcohol use: No   Drug use: No   Sexual activity: Yes    Birth control/protection: Post-menopausal  Other Topics Concern   Not on file  Social History Narrative   Not on file   Social Determinants of Health   Financial Resource Strain: Not on file  Food Insecurity: Not on file  Transportation Needs: Not on file  Physical Activity: Not on file  Stress: Not on file  Social Connections: Not on file  Intimate Partner Violence: Not on file    FAMILY HISTORY: Family History  Problem Relation Age of Onset   Diabetes Mother    Hypertension Mother    Hyperlipidemia Mother    Dementia Mother    Breast cancer Neg Hx    Ovarian cancer Neg Hx    Colon cancer Neg Hx     ALLERGIES:  is allergic to abatacept, codeine, iodine, latex, shellfish allergy, trimethoprim hydrochloride [trimethoprim], and penicillins.  MEDICATIONS:  Current Outpatient Medications  Medication Sig Dispense Refill   Acetaminophen (TYLENOL EXTRA STRENGTH PO) Take 1,300 mg by mouth 2 (two) times daily as needed.     albuterol (ACCUNEB) 1.25 MG/3ML nebulizer solution Take 1 ampule by nebulization every 6 (six) hours as needed for wheezing.     albuterol (PROVENTIL HFA;VENTOLIN HFA) 108 (90 Base) MCG/ACT inhaler Inhale 2 puffs into the lungs every 6 (six) hours as needed for wheezing or shortness of breath. 1 Inhaler 0   ascorbic acid (VITAMIN C) 500 MG tablet Take 500 mg by mouth daily.     aspirin EC 81 MG tablet Take 81 mg by mouth daily.     azelastine (ASTELIN) 0.1 % nasal spray Place 2 sprays into both  nostrils 2 (two) times daily. 30 mL 5   Calcium Carbonate-Vit D-Min (CALCIUM 600+D PLUS MINERALS) 600-400 MG-UNIT TABS Take 1 tablet by mouth 2 (two) times daily.     cetirizine (ZYRTEC) 10 MG tablet Take 1 tablet (10 mg total) by mouth daily. 30 tablet 0   Cholecalciferol (D3 ADULT PO) Take 1 capsule by mouth daily.     clotrimazole-betamethasone (LOTRISONE) cream Apply topically 4 (four) times daily as needed.     desloratadine (CLARINEX)  5 MG tablet Take 5 mg by mouth daily.     docusate sodium (COLACE) 50 MG capsule Take 50 mg by mouth 2 (two) times daily.     empagliflozin (JARDIANCE) 25 MG TABS tablet Take 12.5 mg by mouth daily.     etanercept (ENBREL) 25 MG injection Inject 25 mg into the skin.     fluticasone (FLONASE) 50 MCG/ACT nasal spray Place 1 spray into both nostrils 2 (two) times daily. 16 g 0   fluticasone-salmeterol (ADVAIR) 250-50 MCG/ACT AEPB Inhale 1 puff into the lungs in the morning and at bedtime.     furosemide (LASIX) 10 MG/ML solution Take by mouth daily.     gabapentin (NEURONTIN) 300 MG capsule Take 1 capsule (300 mg total) by mouth 3 (three) times daily. 90 capsule 1   hydrALAZINE (APRESOLINE) 25 MG tablet Take 25 mg by mouth 2 (two) times daily.     indomethacin (INDOCIN) 50 MG capsule Take 50 mg by mouth in the morning, at noon, in the evening, and at bedtime.     ipratropium (ATROVENT) 0.02 % nebulizer solution Take 0.5 mg by nebulization every 4 (four) hours as needed.      leflunomide (ARAVA) 20 MG tablet Take 20 mg by mouth daily.     levothyroxine (SYNTHROID) 75 MCG tablet Take 75 mcg by mouth daily before breakfast.     lisinopril (PRINIVIL,ZESTRIL) 40 MG tablet Take 20 mg by mouth in the morning and at bedtime.      metoprolol succinate (TOPROL-XL) 50 MG 24 hr tablet Take 50 mg by mouth daily. Take with or immediately following a meal.     metroNIDAZOLE (METROGEL VAGINAL) 0.75 % vaginal gel Insert 5 grams (37.5 mg of metronidazole) of gel into vagina once  daily at bedtime for five days 70 g 0   montelukast (SINGULAIR) 10 MG tablet Take 10 mg by mouth at bedtime.     nortriptyline (PAMELOR) 25 MG capsule Take 25 mg by mouth at bedtime.     olopatadine (PATANOL) 0.1 % ophthalmic solution Place 1 drop into both eyes 2 (two) times daily. 5 mL 5   omeprazole (PRILOSEC) 10 MG capsule Take 20 mg by mouth daily.     pravastatin (PRAVACHOL) 40 MG tablet Take 40 mg by mouth daily.     predniSONE (DELTASONE) 50 MG tablet Take 1 tablet (50 mg total) by mouth daily with breakfast. 5 tablet 0   Prenatal Vit-Fe Fumarate-FA (MULTIVITAMIN-PRENATAL) 27-0.8 MG TABS tablet Take 1 tablet by mouth daily.      senna-docusate (SENOKOT-S) 8.6-50 MG tablet Take 1 tablet by mouth at bedtime as needed for mild constipation.     tiotropium (SPIRIVA) 18 MCG inhalation capsule Place 18 mcg into inhaler and inhale daily.     traMADol (ULTRAM) 50 MG tablet Take 100 mg by mouth as needed (for rheumatoid arthritis).     verapamil (CALAN) 120 MG tablet Take 240 mg by mouth 2 (two) times daily.     clotrimazole (GYNE-LOTRIMIN) 1 % vaginal cream Place 1 Applicatorful vaginally daily. (Patient not taking: Reported on 02/09/2023)     EPINEPHrine 0.3 mg/0.3 mL IJ SOAJ injection Use as directed for severe allergic reaction. (Patient not taking: Reported on 02/09/2023) 2 Device 1   FEROSUL 325 (65 Fe) MG tablet TAKE ONE TABLET BY MOUTH TWICE DAILY WITH A MEAL (Patient not taking: Reported on 02/09/2023) 60 tablet 1   leflunomide (ARAVA) 20 MG tablet Take 1 tablet by mouth daily.  nitroGLYCERIN (NITROSTAT) 0.4 MG SL tablet Place under the tongue. Place 1 tablet (0.4 mg total) under the tongue every 5 (five) minutes as needed for Chest pain May take up to 3 doses.     No current facility-administered medications for this visit.     PHYSICAL EXAMINATION: ECOG PERFORMANCE STATUS: 1 - Symptomatic but completely ambulatory  Physical Exam Constitutional:      General: She is not in acute  distress.    Appearance: She is obese.  HENT:     Head: Normocephalic and atraumatic.  Eyes:     General: No scleral icterus. Cardiovascular:     Rate and Rhythm: Normal rate and regular rhythm.     Heart sounds: Normal heart sounds.  Pulmonary:     Effort: Pulmonary effort is normal. No respiratory distress.     Breath sounds: No wheezing.  Abdominal:     General: Bowel sounds are normal. There is no distension.     Palpations: Abdomen is soft.  Musculoskeletal:        General: Deformity present. Normal range of motion.     Cervical back: Normal range of motion and neck supple.  Skin:    General: Skin is warm and dry.     Findings: No erythema or rash.  Neurological:     Mental Status: She is alert and oriented to person, place, and time. Mental status is at baseline.     Cranial Nerves: No cranial nerve deficit.     Coordination: Coordination normal.  Psychiatric:        Mood and Affect: Mood normal.     LABORATORY DATA:  I have reviewed the data as listed    Latest Ref Rng & Units 02/04/2023    1:24 PM 09/17/2022    9:25 PM 08/01/2022   12:54 PM  CBC  WBC 4.0 - 10.5 K/uL 8.1  7.7  7.5   Hemoglobin 12.0 - 15.0 g/dL 12.4  12.2  11.4   Hematocrit 36.0 - 46.0 % 40.8  39.8  36.9   Platelets 150 - 400 K/uL 272  247  241       Latest Ref Rng & Units 02/04/2023    1:24 PM 09/17/2022    9:25 PM 08/01/2022   12:54 PM  CMP  Glucose 70 - 99 mg/dL 125  119  104   BUN 8 - 23 mg/dL 14  8  11   $ Creatinine 0.44 - 1.00 mg/dL 0.75  0.74  0.65   Sodium 135 - 145 mmol/L 138  137  138   Potassium 3.5 - 5.1 mmol/L 3.8  3.8  3.5   Chloride 98 - 111 mmol/L 104  104  105   CO2 22 - 32 mmol/L 25  23  24   $ Calcium 8.9 - 10.3 mg/dL 9.3  9.5  8.7   Total Protein 6.5 - 8.1 g/dL 8.3   7.6   Total Bilirubin 0.3 - 1.2 mg/dL 0.2   0.4   Alkaline Phos 38 - 126 U/L 80   81   AST 15 - 41 U/L 22   30   ALT 0 - 44 U/L 16   20      Iron/TIBC/Ferritin/ %Sat    Component Value Date/Time   IRON 43  01/02/2021 1331   TIBC 200 (L) 01/02/2021 1331   FERRITIN 759 (H) 01/02/2021 1331   IRONPCTSAT 22 01/02/2021 1331      RADIOGRAPHIC STUDIES: I have personally reviewed the radiological images as  listed and agreed with the findings in the report., No results found.

## 2023-02-09 NOTE — Assessment & Plan Note (Addendum)
#  Neuropathy, suspect possibly be secondary to chemotherapy.  She is on gabapentin 300 mg 3 times daily

## 2023-02-09 NOTE — Progress Notes (Signed)
Pt here for follow up. No new concerns voiced.   

## 2023-02-09 NOTE — Assessment & Plan Note (Addendum)
follow-up rheumatologist. - off Prednisone, now on Enbrel.

## 2023-02-09 NOTE — Assessment & Plan Note (Signed)
Continue CT surveillance.

## 2023-02-09 NOTE — Assessment & Plan Note (Signed)
Recurrent endometrial cancer Status post 6 cycles of chemotherapy with carboplatin and Taxol. Post chemotherapy CT scan showed excellent response and resolution of lymphadenopathy. 11/15/ 2022, CT scan showed no disease progression or recurrence. 04/05/2022, CT angiogram chest PE program showed no PE, peripheral bronchi secondary suggesting of acute bronchitis.  No evidence of consolidating pneumonia or pulmonary edema. Labs are reviewed and discussed with patient. CA125 has been stable. Obtain CT chest abdomen pelvis w contrast in May 2024 Continue alternate follow-up with GYN oncology and Oncology every 6 months.

## 2023-02-11 ENCOUNTER — Other Ambulatory Visit: Payer: Self-pay | Admitting: Oncology

## 2023-02-11 MED ORDER — PREDNISONE 50 MG PO TABS: 50.0000 mg | ORAL_TABLET | ORAL | 0 refills | Status: DC

## 2023-03-28 ENCOUNTER — Encounter: Payer: Self-pay | Admitting: Intensive Care

## 2023-03-28 ENCOUNTER — Emergency Department: Payer: No Typology Code available for payment source

## 2023-03-28 ENCOUNTER — Emergency Department
Admission: EM | Admit: 2023-03-28 | Discharge: 2023-03-28 | Disposition: A | Discharge status: Home / Self Care | Payer: No Typology Code available for payment source | Attending: Emergency Medicine | Admitting: Emergency Medicine

## 2023-03-28 ENCOUNTER — Other Ambulatory Visit: Payer: Self-pay

## 2023-03-28 DIAGNOSIS — J0100 Acute maxillary sinusitis, unspecified: Secondary | ICD-10-CM | POA: Insufficient documentation

## 2023-03-28 DIAGNOSIS — M069 Rheumatoid arthritis, unspecified: Secondary | ICD-10-CM | POA: Diagnosis not present

## 2023-03-28 DIAGNOSIS — M25512 Pain in left shoulder: Secondary | ICD-10-CM | POA: Diagnosis present

## 2023-03-28 DIAGNOSIS — S40012A Contusion of left shoulder, initial encounter: Secondary | ICD-10-CM | POA: Diagnosis not present

## 2023-03-28 DIAGNOSIS — W06XXXA Fall from bed, initial encounter: Secondary | ICD-10-CM | POA: Diagnosis not present

## 2023-03-28 DIAGNOSIS — W19XXXA Unspecified fall, initial encounter: Secondary | ICD-10-CM

## 2023-03-28 MED ORDER — PREDNISONE 20 MG PO TABS
60.0000 mg | ORAL_TABLET | Freq: Once | ORAL | Status: AC
  Administered 2023-03-28: 60 mg via ORAL
  Filled 2023-03-28: qty 3

## 2023-03-28 MED ORDER — DOXYCYCLINE HYCLATE 100 MG PO TABS: 100.0000 mg | ORAL_TABLET | Freq: Two times a day (BID) | ORAL | 0 refills | Status: DC

## 2023-03-28 MED ORDER — PREDNISONE 50 MG PO TABS: 50.0000 mg | ORAL_TABLET | Freq: Every day | ORAL | 0 refills | Status: DC

## 2023-03-28 NOTE — ED Triage Notes (Signed)
Patient c/o sinus infection for a few days. Also reports she reached to far last night and fell out of bed onto left shoulder. C/o soreness in left shoulder.   Pt states her rheumatoid arthritis is flaring up at the moment and causing swelling and pain in her hands.   Baseline ambulatory with walker or cane

## 2023-03-28 NOTE — ED Provider Notes (Signed)
[facility] Provider Note  Patient Contact: 6:13 PM (approximate)   History   Sinusitis and Fall   HPI  Sandra Fischer is a 72 y.o. female who presents the emergency department multiple complaints.  Patient states that she is having some rheumatoid arthritis flare which typically require some steroids.  She states that she is also developing some symptoms consistent with sinus infection and also took a fall last night onto her left shoulder.  Patient states that she has allergic rhinitis that due to the pollen has been worsening.  She now is developing pressure, constant postnasal drip which is causing a cough.  No fevers or chills.  Patient denies any sick contacts.  Denies any sore throat, shortness of breath, chest pain.  Patient states that unrelated to these 2 more chronic complaints she also accidentally rolled out of bed while reaching for her phone on the bedside table.  Landed on her shoulder and this is the only injury or complaint.  She still has good range of motion but would like imaging just to make sure there was no underlying injury.     Physical Exam   Triage Vital Signs: ED Triage Vitals  Enc Vitals Group     BP 03/28/23 1721 (!) 175/87     Pulse Rate 03/28/23 1721 74     Resp 03/28/23 1721 16     Temp 03/28/23 1721 98.3 F (36.8 C)     Temp Source 03/28/23 1721 Oral     SpO2 03/28/23 1721 95 %     Weight 03/28/23 1722 196 lb 3.4 oz (89 kg)     Height 03/28/23 1722 5\' 2"  (1.575 m)     Head Circumference --      Peak Flow --      Pain Score 03/28/23 1721 5     Pain Loc --      Pain Edu? --      Excl. in [place]? --     Most recent vital signs: Vitals:   03/28/23 1721  BP: (!) 175/87  Pulse: 74  Resp: 16  Temp: 98.3 F (36.8 C)  SpO2: 95%     General: Alert and in no acute distress. Head: No acute traumatic findings* ENT:      Ears:       Nose: Mild congestion/rhinnorhea.  Visualization of the turbinates bilaterally reveals  boggy turbinate on the right, injected turbinate on the left.  Tenderness over the left maxillary sinus.      Mouth/Throat: Mucous membranes are moist. Neck: No stridor. No cervical spine tenderness to palpation.  Cardiovascular:  Good peripheral perfusion Respiratory: Normal respiratory effort without tachypnea or retractions. Lungs CTAB. Good air entry to the bases with no decreased or absent breath sounds Gastrointestinal: Bowel sounds 4 quadrants. Soft and nontender to palpation. No guarding or rigidity. No palpable masses. No distention. No CVA tenderness. Musculoskeletal: Full range of motion to all extremities.  Visualization of the left shoulder reveals no obvious signs of trauma.  No deformity.  Good range of motion is preserved.  Patient has tenderness along the lateral clavicle extending into the lateral shoulder joint line.  No palpable abnormality. Neurologic:  No gross focal neurologic deficits are appreciated.  Skin:   No rash noted Other:   ED Results / Procedures / Treatments   Labs (all labs ordered are listed, but only abnormal results are displayed) Labs Reviewed - No data to display   EKG  ED ECG REPORT I,  Charline Bills Tabitha Riggins,  personally viewed and interpreted this ECG.   Date: 03/28/2023  EKG Time: 1734 hrs.  Rate: 77 bpm  Rhythm: normal sinus rhythm, PAC's noted, .  Axis: Normal axis  Intervals:none  ST&T Change: No ST elevation or depression noted  Normal sinus rhythm.  No STEMI.  Compared to previous EKG from 04/05/2022, PACs replaced PVCs.  Otherwise no significant change  RADIOLOGY  I personally viewed, evaluated, and interpreted these images as part of my medical decision making, as well as reviewing the written report by the radiologist.  ED Provider Interpretation: No acute traumatic finding to the right shoulder.  Chronic degenerative changes are identified.  DG Shoulder Left  Result Date: 03/28/2023 CLINICAL DATA:  Fall, shoulder pain.  EXAM: LEFT SHOULDER - 2+ VIEW COMPARISON:  None Available. FINDINGS: There is no evidence of fracture or dislocation. Mild acromioclavicular degenerative change. Small subacromial spur. No erosions or avascular necrosis. Soft tissues are unremarkable. IMPRESSION: 1. No fracture or dislocation of the left shoulder. 2. Mild acromioclavicular degenerative change and small subacromial spur. Electronically Signed   By: Keith Rake M.D.   On: 03/28/2023 18:30    PROCEDURES:  Critical Care performed: No  Procedures   MEDICATIONS ORDERED IN ED: Medications  predniSONE (DELTASONE) tablet 60 mg (60 mg Oral Given 03/28/23 1854)     IMPRESSION / MDM / ASSESSMENT AND PLAN / ED COURSE  I reviewed the triage vital signs and the nursing notes.                                 Differential diagnosis includes, but is not limited to, rheumatoid arthritis flare, fibromyalgia pain, shoulder dislocation, shoulder fracture, shoulder contusion, allergic rhinitis, sinusitis  Patient's presentation is most consistent with acute presentation with potential threat to life or bodily function.   Patient's diagnosis is consistent with sinusitis, rheumatoid arthritis flare, shoulder contusion.  Patient presents emergency department multiple complaints.  Patient is having symptoms consistent with a rheumatoid arthritis flare.  She typically receives steroids for same and is agreeable to this plan.  She has some findings consistent with sinusitis, this may be completely allergic but the left maxillary sinus was tender to percussion and we will treat with short course of antibiotic.  Imaging of the left shoulder is reassuring with no acute fracture.  Given the fact she was complaining of chest pain patient had EKG performed on triage protocols at triage.  EKG is reassuring.  She has no chest pain, shortness of breath to investigate further with labs.  Patient is stable for discharge at this time..  Patient is given ED  precautions to return to the ED for any worsening or new symptoms.     FINAL CLINICAL IMPRESSION(S) / ED DIAGNOSES   Final diagnoses:  Acute maxillary sinusitis, recurrence not specified  Contusion of left shoulder, initial encounter  Fall, initial encounter  Rheumatoid arthritis flare ([place])     Rx / DC Orders   ED Discharge Orders          Ordered    predniSONE (DELTASONE) 50 MG tablet  Daily with breakfast        03/28/23 1857    doxycycline (VIBRA-TABS) 100 MG tablet  2 times daily        03/28/23 1857             Note:  This document was prepared using Dragon voice recognition software and  may include unintentional dictation errors.   Brynda Peon 03/28/23 1857    Blake Divine, MD 03/28/23 1946

## 2023-04-17 ENCOUNTER — Encounter: Payer: Self-pay | Admitting: Intensive Care

## 2023-04-17 ENCOUNTER — Other Ambulatory Visit: Payer: Self-pay

## 2023-04-17 ENCOUNTER — Emergency Department: Payer: No Typology Code available for payment source

## 2023-04-17 ENCOUNTER — Emergency Department
Admission: EM | Admit: 2023-04-17 | Discharge: 2023-04-17 | Disposition: A | Discharge status: Home / Self Care | Payer: No Typology Code available for payment source | Attending: Emergency Medicine | Admitting: Emergency Medicine

## 2023-04-17 DIAGNOSIS — J40 Bronchitis, not specified as acute or chronic: Secondary | ICD-10-CM | POA: Insufficient documentation

## 2023-04-17 DIAGNOSIS — R0789 Other chest pain: Secondary | ICD-10-CM | POA: Diagnosis present

## 2023-04-17 LAB — BASIC METABOLIC PANEL
Anion gap: 11 (ref 5–15)
BUN: 12 mg/dL (ref 8–23)
CO2: 24 mmol/L (ref 22–32)
Calcium: 9.2 mg/dL (ref 8.9–10.3)
Chloride: 103 mmol/L (ref 98–111)
Creatinine, Ser: 0.89 mg/dL (ref 0.44–1.00)
GFR, Estimated: >60 mL/min (ref >60)
Glucose, Bld: 110 mg/dL — ABNORMAL HIGH (ref 70–99)
Potassium: 3.5 mmol/L (ref 3.5–5.1)
Sodium: 138 mmol/L (ref 135–145)

## 2023-04-17 LAB — CBC
HCT: 42.9 % (ref 36.0–46.0)
Hemoglobin: 12.9 g/dL (ref 12.0–15.0)
MCH: 26.1 pg (ref 26.0–34.0)
MCHC: 30.1 g/dL (ref 30.0–36.0)
MCV: 86.7 fL (ref 80.0–100.0)
Platelets: 241 10*3/uL (ref 150–400)
RBC: 4.95 MIL/uL (ref 3.87–5.11)
RDW: 16.4 % — ABNORMAL HIGH (ref 11.5–15.5)
WBC: 5.9 10*3/uL (ref 4.0–10.5)
nRBC: 0 % (ref 0.0–0.2)

## 2023-04-17 LAB — TROPONIN I (HIGH SENSITIVITY): Troponin I (High Sensitivity): 5 ng/L (ref <18)

## 2023-04-17 MED ORDER — KETOROLAC TROMETHAMINE 60 MG/2ML IM SOLN
60.0000 mg | Freq: Once | INTRAMUSCULAR | Status: DC
  Filled 2023-04-17: qty 2

## 2023-04-17 MED ORDER — DOXYCYCLINE HYCLATE 100 MG PO CAPS: 100.0000 mg | ORAL_CAPSULE | Freq: Two times a day (BID) | ORAL | 0 refills | Status: AC

## 2023-04-17 MED ORDER — NAPROXEN 250 MG PO TABS: 250.0000 mg | ORAL_TABLET | Freq: Two times a day (BID) | ORAL | 0 refills | Status: AC

## 2023-04-17 MED ORDER — HYDROCODONE-ACETAMINOPHEN 5-325 MG PO TABS
1.0000 | ORAL_TABLET | Freq: Once | ORAL | Status: AC
  Administered 2023-04-17: 1 via ORAL
  Filled 2023-04-17: qty 1

## 2023-04-17 MED ORDER — IPRATROPIUM-ALBUTEROL 0.5-2.5 (3) MG/3ML IN SOLN
3.0000 mL | Freq: Once | RESPIRATORY_TRACT | Status: AC
  Administered 2023-04-17: 3 mL via RESPIRATORY_TRACT
  Filled 2023-04-17: qty 3

## 2023-04-17 MED ORDER — DOXYCYCLINE HYCLATE 100 MG PO TABS
100.0000 mg | ORAL_TABLET | Freq: Once | ORAL | Status: AC
  Administered 2023-04-17: 100 mg via ORAL
  Filled 2023-04-17: qty 1

## 2023-04-17 NOTE — ED Triage Notes (Signed)
Patient c/o left sided chest pain that started this AM and sob. Describes as achy pain

## 2023-04-17 NOTE — ED Notes (Signed)
PT brought to ed rm 2 at this time, this RN now assuming care.  

## 2023-04-17 NOTE — ED Provider Notes (Signed)
[facility] Provider Note    Event Date/Time   First MD Initiated Contact with Patient 04/17/23 1940     (approximate)   History   Shortness of Breath and Chest Pain   HPI  Sandra Fischer is a 72 y.o. female here with shortness of breath and sensation of chest tightness.  The patient states that for the last week and a half, she has had a cough, occasional sputum production, and occasional shortness of breath.  She states she feels some chest tightness like her chest is full but no overt chest pain.  She has had history of something similar in the past and has been on albuterol that she has had no history of asthma.  She does not smoke.  Denies any leg swelling.  Denies any pleurisy.  No fevers or chills.  No history of coronary disease.     Physical Exam   Triage Vital Signs: ED Triage Vitals  Enc Vitals Group     BP 04/17/23 1856 (!) 144/73     Pulse Rate 04/17/23 1856 88     Resp 04/17/23 1856 16     Temp 04/17/23 1856 99.2 F (37.3 C)     Temp Source 04/17/23 1856 Oral     SpO2 04/17/23 1856 98 %     Weight 04/17/23 1851 196 lb 3.4 oz (89 kg)     Height 04/17/23 1851  (1.575 m)     Head Circumference --      Peak Flow --      Pain Score 04/17/23 1851 5     Pain Loc --      Pain Edu? --      Excl. in GC? --     Most recent vital signs: Vitals:   04/17/23 2130 04/17/23 2200  BP: (!) 122/58 126/67  Pulse: 76 77  Resp: 19 18  Temp:    SpO2: 97% 96%     General: Awake, no distress.  CV:  Good peripheral perfusion.  Resp:  Normal work of breathing.  Scant expiratory wheezes with prolonged expiration. Abd:  No distention.  No tenderness.  No upper quadrant pain. Other:  No lower extreme edema.  No asymmetry.   ED Results / Procedures / Treatments   Labs (all labs ordered are listed, but only abnormal results are displayed) Labs Reviewed  BASIC METABOLIC PANEL - Abnormal; Notable for the following components:      Result Value    Glucose, Bld 110 (*)    All other components within normal limits  CBC - Abnormal; Notable for the following components:   RDW 16.4 (*)    All other components within normal limits  TROPONIN I (HIGH SENSITIVITY)     EKG Normal sinus rhythm, ventricular rate 83.  PR 172, QRS 66, QTc 425.  No acute ST elevations depressions.   RADIOLOGY No acute disease   I also independently reviewed and agree with radiologist interpretations.   PROCEDURES:  Critical Care performed: No  .1-3 Lead EKG Interpretation  Performed by: Shaune Pollack, MD Authorized by: Shaune Pollack, MD     Interpretation: normal     ECG rate:  70-90   ECG rate assessment: normal     Rhythm: sinus rhythm     Ectopy: none     Conduction: normal   Comments:     Indication: SOB     MEDICATIONS ORDERED IN ED: Medications  ketorolac (TORADOL) injection 60 mg (60 mg Intramuscular Not  Given 04/17/23 2110)  ipratropium-albuterol (DUONEB) 0.5-2.5 (3) MG/3ML nebulizer solution 3 mL (3 mLs Nebulization Given 04/17/23 2108)  HYDROcodone-acetaminophen (NORCO/VICODIN) 5-325 MG per tablet 1 tablet (1 tablet Oral Given 04/17/23 2108)  doxycycline (VIBRA-TABS) tablet 100 mg (100 mg Oral Given 04/17/23 2108)     IMPRESSION / MDM / ASSESSMENT AND PLAN / ED COURSE  I reviewed the triage vital signs and the nursing notes.                              Differential diagnosis includes, but is not limited to, bronchitis/CAP with chest wall pain, RA-related or other MSK chest pain, ACS, CHF, unlikely PE or dissection  Patient's presentation is most consistent with acute presentation with potential threat to life or bodily function.  The patient is on the cardiac monitor to evaluate for evidence of arrhythmia and/or significant heart rate changes   72 yo F with PMHx RA, asthma, DM, here with atypical chest pain, cough. Suspect chest wall pain from coughing related to possible bronchitis, with h/o same and h/o recurrent  PNA. She is immunosuppressed for her RA. CXR is clear. EKG nonischemic, trop negative despite sx >6 hours - do not suspect ACS. CBC without leukocytosis, and she does not appear septic.BMP unremarkable.  Given her history of recurrent PNA/bronchitis, will place on doxy given her infecitous sx and cough, and trial nsaids for likely MSK pain.    FINAL CLINICAL IMPRESSION(S) / ED DIAGNOSES   Final diagnoses:  Chest wall pain  Bronchitis     Rx / DC Orders   ED Discharge Orders          Ordered    doxycycline (VIBRAMYCIN) 100 MG capsule  2 times daily        04/17/23 2101    naproxen (NAPROSYN) 250 MG tablet  2 times daily with meals        04/17/23 2101             Note:  This document was prepared using Dragon voice recognition software and may include unintentional dictation errors.   Shaune Pollack, MD 04/18/23 [phone]

## 2023-04-17 NOTE — Discharge Instructions (Signed)
Take the Doxycycline antibiotic twice daily  I'd recommend an over-the-counter antacid while taking this  Instead of Indomethacin, take the NAPROXEN twice a day with meals for chest wall inflammation  Continue your usual inhalers but use your ALBUTEROL nebulizer or rescue inhaler (red one) 3-4 x daily for the next few days then as needed for wheezing

## 2023-04-17 NOTE — ED Notes (Signed)
ED Provider at bedside. 

## 2023-04-22 ENCOUNTER — Inpatient Hospital Stay: Payer: Medicare Other | Attending: Oncology | Admitting: Obstetrics and Gynecology

## 2023-04-22 VITALS — BP 149/71 | HR 61 | Temp 96.3°F | Wt 198.1 lb

## 2023-04-22 DIAGNOSIS — Z90710 Acquired absence of both cervix and uterus: Secondary | ICD-10-CM | POA: Insufficient documentation

## 2023-04-22 DIAGNOSIS — Z8542 Personal history of malignant neoplasm of other parts of uterus: Secondary | ICD-10-CM | POA: Diagnosis present

## 2023-04-22 DIAGNOSIS — Z08 Encounter for follow-up examination after completed treatment for malignant neoplasm: Secondary | ICD-10-CM | POA: Diagnosis not present

## 2023-04-22 DIAGNOSIS — M549 Dorsalgia, unspecified: Secondary | ICD-10-CM | POA: Diagnosis not present

## 2023-04-22 DIAGNOSIS — R5383 Other fatigue: Secondary | ICD-10-CM | POA: Diagnosis not present

## 2023-04-22 DIAGNOSIS — R1030 Lower abdominal pain, unspecified: Secondary | ICD-10-CM | POA: Insufficient documentation

## 2023-04-22 DIAGNOSIS — Z90722 Acquired absence of ovaries, bilateral: Secondary | ICD-10-CM | POA: Diagnosis not present

## 2023-04-22 DIAGNOSIS — Z9221 Personal history of antineoplastic chemotherapy: Secondary | ICD-10-CM | POA: Diagnosis not present

## 2023-04-22 DIAGNOSIS — C541 Malignant neoplasm of endometrium: Secondary | ICD-10-CM

## 2023-04-22 DIAGNOSIS — R911 Solitary pulmonary nodule: Secondary | ICD-10-CM | POA: Diagnosis not present

## 2023-04-22 NOTE — Progress Notes (Signed)
Gynecologic Oncology Interval Visit   Referring Provider: Dr. Logan Bores  Chief Complaint: Stage IIIC-1 serous endometrial cancer  Subjective:  Sandra Fischer is a 72 y.o. G2P0 female (s/p laparotomy myomectomy for leiomyoma), initially seen in consultation from Dr. 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   She was last seen by Dr. Cathie Hoops on 02/09/2023 and had a reassuring exam with plan to obtain CT chest abdomen pelvis w contrast in May 2024.  On April 17, 2023 she presented to the emergency room with shortness of breath and chest pain.  She was thought to have bronchitis and was treated with antibiotics.  Chest x-ray was negative for any active cardiopulmonary disease. She has multiple complaints today including fatigue, shortness of breath, cough, back pain, lower abdominal pain, allergies. Her shortness of breath and cough have improved.    CA 125 02/04/23  15.6 08/01/22  11.6 01/13/22 11.1 07/08/21 11.4 01/02/21  12.6 09/10/20 11.2 07/03/20  13.6      Gynecologic Oncology History:  She initially presented to Dr. Logan Bores as [phone] female with complaint of postmenopausal bleeding, intermittent, heavy at times.    Transabdominal ultrasound on 05/05/2019 demonstrated endometrium measuring 22mm. Uterus anteverted measuring 11.3 x 6.6 x 7.9 cm, heterogeous echo texture w/o evidence of focal masses. Within uterus multiple suspected fibroids measuring 7.2 x 4.8 x 6.6 cm and 2.6 x 1.7 x 3.1 cm. Ovaries not visualized. No adnexal masses. No free fluid in cul de sac.   Endometrial biopsy was performed on 05/20/2019 and demonstrated high-grade mixed endometrioid, predominantly, and serous carcinoma.  She complains of persistent bleeding and fatigue which she attributes to the bleeding. She presents today for management. She has significant medical issues including rheumatoid arthritis requiring  chronic steroids (5-10 mg daily) for a long time (she does not know how long) and leflunomide (ARAVA). She also has undergone myomectomy for leiomyoma and abdominoplasty.  She also has a history of cardiac disease. She Cardioversion for SVT on 10/17/2018. Her cardiologist Dr. Darrold Junker. The patient presented to [facility] on 10/17/2018 for chest pain and shortness of breath, noted to be in SVT, converted to sinus rhythm with adenosine, in the setting of colitis, anemia with hemoglobin 8.5, and untreated hypothyroidism with TSH 25, which has since returned to normal range.  2D echocardiogram revealed moderately reduced LV function with LVEF 35 to 40% with diffuse hypokinesis. The patient underwent Lexiscan Myoview on 01/19/2019, which revealed revealed LVEF 49%, a mild fixed inferior wall defect, scar versus artifact, with no evidence of ischemia. She was last seen on 04/26/2019 by Cardiology Leanora Ivanoff) with a plan to stay on her current medications and she was counseled about low sodium diet, DASH, and continuing hyperlipidemia medications.   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 She is married and provides care for her mother.   06/14/2019 CT C/A/P IMPRESSION: 1. Bulky fibroid uterus. There is no direct noncontrast CT evidence of endometrial malignancy. No evidence of lymphadenopathy or metastatic disease in the chest, abdomen, or pelvis. Please note that noncontrast CT is limited for the evaluation of solid organ metastases. 2. Stable 4 mm ground-glass pulmonary nodule of the left pulmonary apex (series 3, image 24). Although nonspecific, this is an unlikely isolated manifestation of metastatic disease. Attention on follow-up.  3. Chronic, incidental, and postoperative findings as detailed above.  She underwent TLH_BSO with Dr. Myrtie Neither on 06/21/2019.   Tumor size 9.3  cm, invading 99% of myometrium (3.55 of 3.6 cm), positive right external iliac sentinel lymph node. Negative washings. MSS/MMR -  Intact/normal. HER2 - negative at 28%/1+.   Case was discussed at [facility] Tumor Board on 07/04/2019. Possible PORTEC3 regimen given subgroup analysis showing benefit in serous history vs clinical trial was discussed.   Based on pathology, adjuvant chemotherapy & radiation was recommended. She saw Dr. Cathie Hoops on 07/14/2019 and lengthy discussion regarding rationale of adjuvant treatment. She declined adjuvant treatment.  10/20/2019- CT Chest Abdomen Pelvis W Contrast multiple newly enlarged retroperitoneal and right iliac lymph nodes, the largest left retroperitoneal node measuring 1.9 x 1.6 cm (series 2, image 71), concerning for nodal metastatic disease. 3. Unchanged 4 mm ground-glass pulmonary nodule of the left pulmonary apex (series 3, image 24). This remains nonspecific although is again an unlikely manifestation of pulmonary metastatic disease.  Findings consistent with recurrent disease with adenopathy.   11/07/2019-02/20/20 She agreed to treatment and received 6 cycles of carbo-taxol chemotherapy  (dose reduced d/t neuropathy).   03/12/2020 CT Abdomen/pelvis The multiple enlarged retroperitoneal and right iliac lymph nodes identified as new on the previous exam from 10/20/2019 have resolved completely in the interval. 2. Tiny ground-glass pulmonary nodule medial left lung apex is stable in the interval. Likely benign, continued attention on follow-up recommended.  Data from GOG 258 was discussed including no differences in relapse free survival, fewer lower vaginal recurrences and fewer lower pelvic and para-aortic relapsed with the addition of RT. She was seen by Dr. Rushie Chestnut who did not recommend WPRT.   Post chemotherapy CT showed excellent response. Previously enlarged retroperitoneal and right iliac nodes have resolved. Adjuvant radiation was not recommended.   CT 02/14/21 chest IMPRESSION: 1. Left apical 4 mm ground-glass nodule is unchanged, considered benign. 2. A right lower lobe solid 3  mm nodule is also not significantly changed and can be presumed benign. 3.  No acute process or evidence of metastatic disease in the chest. 4.  Aortic Atherosclerosis (ICD10-I70.0). 5. Mild hepatic steatosis. 6.  Tiny hiatal hernia.  CT 02/14/21 chest  10/29/22 CT C/A/P scan  IMPRESSION: 1. Status post hysterectomy. 2. No noncontrast evidence of recurrent or metastatic disease in the chest, abdomen, or pelvis. 3. Unchanged tiny, benign pulmonary nodules, for which no specific further follow-up or characterization is required. 4. Somewhat coarse contour of the liver, suggestive of cirrhosis. Correlate with biochemical findings. 5. Nonobstructive left nephrolithiasis.  Problem List: Patient Active Problem List   Diagnosis Date Noted   Lung nodule 02/09/2023   Age-related osteoporosis without current pathological fracture 04/22/2022   Diabetes mellitus without complication 04/03/2021   Nuclear sclerotic cataract of right eye 04/03/2021   Severe persistent asthma 04/03/2021   Problem related to unspecified psychosocial circumstances 04/03/2021   Corn of foot 04/03/2021   Dependence on continuous positive airway pressure ventilation 04/03/2021   Dermatophytosis of nail 04/03/2021   Type 2 or unspecified type diabetes mellitus 04/03/2021   Disorder of bone and cartilage 04/03/2021   Dysphagia 04/03/2021   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   History of recurrent pneumonia 04/03/2021   Irritable colon 04/03/2021   Low back pain 04/03/2021   Other alveolar and parietoalveolar pneumonopathy 04/03/2021   Other and unspecified hyperlipidemia 04/03/2021   Other specified disease of nail 04/03/2021   Pain in joint, ankle and foot 04/03/2021   Polyp of colon 04/03/2021   Seborrheic dermatitis 04/03/2021   SVT (supraventricular tachycardia) 04/03/2021   Vitiligo 04/03/2021   Vocal cord dysfunction 04/03/2021  Obesity 04/03/2021   Allergic asthma 04/03/2021   Obstructive sleep apnea  04/03/2021   Encounter for long-term (current) use of steroids 04/03/2021   Diabetic neuropathy 08/02/2020   Iron deficiency anemia 07/03/2020   Vaginal discharge 05/30/2020   Encounter for antineoplastic chemotherapy 01/30/2020   Neuropathy due to chemotherapeutic drug 01/30/2020   Anemia 01/30/2020   Gastroesophageal reflux disease 12/08/2019   Microcytic anemia 11/07/2019   Port-A-Cath in place 11/04/2019   Rheumatoid arthritis 10/29/2019   Goals of care, counseling/discussion 07/20/2019   Endometrial cancer 07/15/2019   Acute blood loss anemia 06/22/2019   Hypothyroidism, adult 06/15/2019   Myalgia and myositis 06/15/2019   Fibromyalgia    Cardiomyopathy 12/06/2018   Atypical chest pain 12/06/2018   Perennial and seasonal allergic rhinitis 02/09/2018   Moderate persistent asthma 02/09/2018   Allergic conjunctivitis 02/09/2018   History of food allergy 02/09/2018   Allergic reaction 02/09/2018   RA (rheumatoid arthritis) 08/22/2016   HTN (hypertension) 08/22/2016    Past Medical History: Past Medical History:  Diagnosis Date   Asthma    Asthma    Asthma exacerbation 08/22/2016   Cancer    Collagen vascular disease    Diabetes mellitus without complication    History of Diabetes    Dyspnea    Environmental allergies    Fibromyalgia    High cholesterol    Hypertension    Hypothyroidism    Iron deficiency anemia 07/03/2020   Neuropathy    RA (rheumatoid arthritis)    Sleep apnea    Thyroid disease     Past Surgical History: Past Surgical History:  Procedure Laterality Date   ABDOMINAL HYSTERECTOMY     CATARACT EXTRACTION     CHOLECYSTECTOMY     COLONOSCOPY WITH PROPOFOL N/A 03/14/2022   Procedure: COLONOSCOPY WITH PROPOFOL;  Surgeon: Regis Bill, MD;  Location: [facility] ENDOSCOPY;  Service: Endoscopy;  Laterality: N/A;  DM   CYSTOURETHROSCOPY     DIAGNOSTIC LAPAROSCOPY     EYE SURGERY     fibroids removed     PORTA CATH REMOVAL N/A 02/14/2021    Procedure: PORTA CATH REMOVAL;  Surgeon: Annice Needy, MD;  Location: [facility] INVASIVE CV LAB;  Service: Cardiovascular;  Laterality: N/A;   THYROIDECTOMY, PARTIAL     tummy tuck      Past Gynecologic History:  As per HPI  OB History:  OB History  Gravida Para Term Preterm AB Living  2       2    SAB IAB Ectopic Multiple Live Births  2            # Outcome Date GA Lbr Len/2nd Weight Sex Delivery Anes PTL Lv  2 SAB           1 SAB             Family History: Family History  Problem Relation Age of Onset   Diabetes Mother    Hypertension Mother    Hyperlipidemia Mother    Dementia Mother    Breast cancer Neg Hx    Ovarian cancer Neg Hx    Colon cancer Neg Hx     Social History: Social History   Socioeconomic History   Marital status: Married    Spouse name: Cedric    Number of children: Not on file   Years of education: Not on file   Highest education level: Not on file  Occupational History   Occupation: Retired  Tobacco Use   Smoking status:  Former    Packs/day: 0.10    Years: 0.25    Additional pack years: 0.00    Total pack years: 0.03    Types: Cigarettes    Quit date: 1980    Years since quitting: 44.3   Smokeless tobacco: Never  Vaping Use   Vaping Use: Never used  Substance and Sexual Activity   Alcohol use: No   Drug use: No   Sexual activity: Yes    Birth control/protection: Post-menopausal  Other Topics Concern   Not on file  Social History Narrative   Not on file   Social Determinants of Health   Financial Resource Strain: Not on file  Food Insecurity: Not on file  Transportation Needs: Not on file  Physical Activity: Not on file  Stress: Not on file  Social Connections: Not on file  Intimate Partner Violence: Not on file   Immunization History  Administered Date(s) Administered   Fluad Quad(high Dose 65+) 11/04/2021   Influenza, High Dose Seasonal PF 10/03/2019   Influenza, Seasonal, Injecte, Preservative Fre 03/01/2013,  09/23/2013, 01/21/2016   Influenza,inj,Quad PF,6+ Mos 10/22/2016, 12/03/2017   Influenza-Unspecified 10/29/2000, 12/29/2000, 10/26/2008, 10/29/2008, 01/27/2012   PFIZER Comirnaty(Gray Top)Covid-19 Tri-Sucrose Vaccine 01/08/2021   PFIZER(Purple Top)SARS-COV-2 Vaccination 04/19/2020, 05/10/2020   Pneumococcal Conjugate-13 09/23/2013   Pneumococcal Polysaccharide-23 12/29/2000     Allergies: Allergies  Allergen Reactions   Abatacept Hives   Codeine Hives, Nausea And Vomiting and Nausea Only   Iodine Swelling   Latex Itching, Hives and Rash   Shellfish Allergy Swelling   Trimethoprim Hydrochloride [Trimethoprim] Other (See Comments)    Pancreatitis   Penicillins Nausea And Vomiting    Current Medications: Current Outpatient Medications  Medication Sig Dispense Refill   Acetaminophen (TYLENOL EXTRA STRENGTH PO) Take 1,300 mg by mouth 2 (two) times daily as needed.     ascorbic acid (VITAMIN C) 500 MG tablet Take 500 mg by mouth daily.     aspirin EC 81 MG tablet Take 81 mg by mouth daily.     azelastine (ASTELIN) 0.1 % nasal spray Place 2 sprays into both nostrils 2 (two) times daily. 30 mL 5   Calcium Carbonate-Vit D-Min (CALCIUM 600+D PLUS MINERALS) 600-400 MG-UNIT TABS Take 1 tablet by mouth 2 (two) times daily.     cetirizine (ZYRTEC) 10 MG tablet Take 1 tablet (10 mg total) by mouth daily. 30 tablet 0   Cholecalciferol (D3 ADULT PO) Take 1 capsule by mouth daily.     clotrimazole-betamethasone (LOTRISONE) cream Apply topically 4 (four) times daily as needed.     desloratadine (CLARINEX) 5 MG tablet Take 5 mg by mouth daily.     docusate sodium (COLACE) 50 MG capsule Take 50 mg by mouth 2 (two) times daily.     doxycycline (VIBRAMYCIN) 100 MG capsule Take 1 capsule (100 mg total) by mouth 2 (two) times daily for 7 days. 14 capsule 0   empagliflozin (JARDIANCE) 25 MG TABS tablet Take 12.5 mg by mouth daily.     etanercept (ENBREL) 25 MG injection Inject 25 mg into the skin.      fluticasone (FLONASE) 50 MCG/ACT nasal spray Place 1 spray into both nostrils 2 (two) times daily. 16 g 0   fluticasone-salmeterol (ADVAIR) 250-50 MCG/ACT AEPB Inhale 1 puff into the lungs in the morning and at bedtime.     furosemide (LASIX) 10 MG/ML solution Take by mouth daily.     gabapentin (NEURONTIN) 300 MG capsule Take 1 capsule (300 mg total) by mouth  3 (three) times daily. 90 capsule 1   hydrALAZINE (APRESOLINE) 25 MG tablet Take 25 mg by mouth 2 (two) times daily.     ipratropium (ATROVENT) 0.02 % nebulizer solution Take 0.5 mg by nebulization every 4 (four) hours as needed.      leflunomide (ARAVA) 20 MG tablet Take 20 mg by mouth daily.     leflunomide (ARAVA) 20 MG tablet Take 1 tablet by mouth daily.     levothyroxine (SYNTHROID) 75 MCG tablet Take 75 mcg by mouth daily before breakfast.     lisinopril (PRINIVIL,ZESTRIL) 40 MG tablet Take 20 mg by mouth in the morning and at bedtime.      metoprolol succinate (TOPROL-XL) 50 MG 24 hr tablet Take 50 mg by mouth daily. Take with or immediately following a meal.     metroNIDAZOLE (METROGEL VAGINAL) 0.75 % vaginal gel Insert 5 grams (37.5 mg of metronidazole) of gel into vagina once daily at bedtime for five days 70 g 0   montelukast (SINGULAIR) 10 MG tablet Take 10 mg by mouth at bedtime.     naproxen (NAPROSYN) 250 MG tablet Take 1 tablet (250 mg total) by mouth 2 (two) times daily with a meal for 5 days. 10 tablet 0   nitroGLYCERIN (NITROSTAT) 0.4 MG SL tablet Place under the tongue. Place 1 tablet (0.4 mg total) under the tongue every 5 (five) minutes as needed for Chest pain May take up to 3 doses.     nortriptyline (PAMELOR) 25 MG capsule Take 25 mg by mouth at bedtime.     olopatadine (PATANOL) 0.1 % ophthalmic solution Place 1 drop into both eyes 2 (two) times daily. 5 mL 5   omeprazole (PRILOSEC) 10 MG capsule Take 20 mg by mouth daily.     pravastatin (PRAVACHOL) 40 MG tablet Take 40 mg by mouth daily.     predniSONE  (DELTASONE) 50 MG tablet Take 1 tablet (50 mg total) by mouth daily with breakfast. 5 tablet 0   Prenatal Vit-Fe Fumarate-FA (MULTIVITAMIN-PRENATAL) 27-0.8 MG TABS tablet Take 1 tablet by mouth daily.      senna-docusate (SENOKOT-S) 8.6-50 MG tablet Take 1 tablet by mouth at bedtime as needed for mild constipation.     sulfaSALAzine (AZULFIDINE) 500 MG tablet TAKE ONE TABLET BY MOUTH TWO TIMES A DAY FOR 14 DAYS, THEN TAKE TWO TABLETS TWO TIMES A DAY FOR 14 DAYS, THEN TAKE THREE TABLETS TWO TIMES A DAY FOR 62 DAYS FOR INFLAMMATION. TAKE AFTER MEALS     tiotropium (SPIRIVA) 18 MCG inhalation capsule Place 18 mcg into inhaler and inhale daily.     verapamil (CALAN) 120 MG tablet Take 240 mg by mouth 2 (two) times daily.     EPINEPHrine 0.3 mg/0.3 mL IJ SOAJ injection Use as directed for severe allergic reaction. (Patient not taking: Reported on 02/09/2023) 2 Device 1   No current facility-administered medications for this visit.   Review of Systems General:  fatigue Skin: no complaints Eyes: no complaints HEENT: no complaints Breasts: no complaints Pulmonary: shortness of breath, cough improved after antibiotics Cardiac: no complaints Gastrointestinal: lower abdominal pain o/w no complaints Genitourinary/Sexual: no complaints Musculoskeletal: back pain Hematology: no complaints Neurologic/Psych: no complaints     Objective:  Physical Examination:  BP (!) 149/71   Pulse 61   Temp (!) 96.3 F (35.7 C)   Wt 198 lb 1.6 oz (89.9 kg)   BMI 36.23 kg/m     GENERAL: Patient is a well appearing female in no  acute distress HEENT:  Atraumatic and normocephalic. PERRL, neck supple. NODES:  No cervical, supraclavicular, axillary, or inguinal lymphadenopathy palpated.  LUNGS:  Normal respiratory pattern ABDOMEN:  Soft, nontender. Nondistended. No masses/ascites/hernia/or hepatomegaly.  EXTREMITIES:  No peripheral edema.   NEURO:  Nonfocal. Well oriented.  Appropriate affect.  Pelvic:  chaperoned by CMA EGBUS: no lesions Cervix: surgically absent Vagina: no lesions, no discharge or bleeding Uterus: surgically absent BME: no palpable masses Rectovaginal: deferred   Labs: No labs on site today  Radiologic Imaging:  Trying to reschedule her CT scan to obtain sooner    Assessment:  Sandra Fischer is a 72 y.o. 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  Tumor size 9.3 cm, invading 99% of myometrium (3.55 of 3.6cm), positive right external iliac sentinel lymph nodes. Negative washings. Recurrent disease 10/20/2019 with adenopathy up to 1.9 x 1.6 cm with CR to chemotherapy with paclitaxel/carboplatin completed 01/2020.  Normal exam today.   CA125 normal in 2/24, but was not elevated at recurrence. Last CT scan 10/2022 reassuring and no definitive evidence of disease. Small pulmonary nodules.    Grade 2 peripheral neuropathy.    Patient had COVID19 in 10/22.    Medical co-morbidities complicating care: She has significant medical issues including rheumatoid arthritis requiring chronic steroids (5-10 mg daily) for a long time (she does not know how long) and leflunomide (ARAVA); HTN; Dilated cardiomyopathy; hyperlipidemia; multiple intra-abdominal surgeries (myomectomy for leiomyoma, cholecystectomy, and abdominoplasty) ; obesity Body mass index is 36.23 kg/m.  Plan:   Problem List Items Addressed This Visit       Genitourinary   Endometrial cancer - Primary (Chronic)   Other Visit Diagnoses     Lower abdominal pain             Will try to move up CT scan C/A/P in view of her symptoms and high risk of recurrence.   Continue follow up with Dr. Cathie Hoops in 3 months and Gyn Onc in 6 months.     Pulmonary nodule unchanged and felt to be benign. No additional surveillance recommended.   I personally had a face to  face interaction and evaluated the patient jointly with the NP, Ms. Consuello Masse.  I have reviewed her history and available records and have performed the key portions of the physical exam including lymph node survey, abdominal exam, pelvic exam with my findings confirming those documented above by the APP.  I have discussed the case with the APP and the patient.  I agree with the above documentation, assessment and plan which was fully formulated by me.  Counseling was completed by me.   I personally saw the patient and performed a substantive portion of this encounter in conjunction with the listed APP as documented above.  Dorothea Yow Leta Jungling, MD

## 2023-04-23 ENCOUNTER — Ambulatory Visit (INDEPENDENT_AMBULATORY_CARE_PROVIDER_SITE_OTHER): Payer: Medicare Other | Admitting: Podiatry

## 2023-04-23 ENCOUNTER — Encounter: Payer: Self-pay | Admitting: Oncology

## 2023-04-23 ENCOUNTER — Encounter: Payer: Self-pay | Admitting: Podiatry

## 2023-04-23 DIAGNOSIS — M79675 Pain in left toe(s): Secondary | ICD-10-CM

## 2023-04-23 DIAGNOSIS — E1142 Type 2 diabetes mellitus with diabetic polyneuropathy: Secondary | ICD-10-CM

## 2023-04-23 DIAGNOSIS — M79674 Pain in right toe(s): Secondary | ICD-10-CM

## 2023-04-23 DIAGNOSIS — B351 Tinea unguium: Secondary | ICD-10-CM

## 2023-04-23 NOTE — Progress Notes (Signed)
This patient returns to my office for at risk foot care.  This patient requires this care by a professional since this patient will be at risk due to having diet controlled diabetes.   This patient is unable to cut nails herself since the patient cannot reach her nails.These nails are painful walking and wearing shoes.  This patient presents for at risk foot care today. ° °General Appearance  Alert, conversant and in no acute stress. ° °Vascular  Dorsalis pedis and posterior tibial  pulses are palpable  bilaterally.  Capillary return is within normal limits  bilaterally. Temperature is within normal limits  bilaterally. ° °Neurologic  Senn-Weinstein monofilament wire test WNL  bilaterally. Muscle power within normal limits bilaterally. ° °Nails Thick disfigured discolored nails with subungual debris  from hallux to fifth toes bilaterally. No evidence of bacterial infection or drainage bilaterally. ° °Orthopedic  No limitations of motion  feet .  No crepitus or effusions noted.  No bony pathology or digital deformities noted.  HAV  B/L. ° °Skin  normotropic skin with no porokeratosis noted bilaterally.  No signs of infections or ulcers noted.    ° °Onychomycosis  Pain in right toes  Pain in left toes  Diabetes   ° °Consent was obtained for treatment procedures.   Mechanical debridement of nails 1-5  bilaterally performed with a nail nipper.  Filed with dremel without incident.   ° ° °Return office visit   10 weeks                  Told patient to return for periodic foot care and evaluation due to potential at risk complications. ° ° °Subrina Vecchiarelli DPM  °

## 2023-04-28 ENCOUNTER — Encounter: Payer: Self-pay | Admitting: Oncology

## 2023-04-29 ENCOUNTER — Ambulatory Visit
Admission: RE | Admit: 2023-04-29 | Discharge: 2023-04-29 | Disposition: A | Discharge status: Home / Self Care | Payer: Medicare Other | Source: Ambulatory Visit | Attending: Oncology | Admitting: Oncology

## 2023-04-29 DIAGNOSIS — C541 Malignant neoplasm of endometrium: Secondary | ICD-10-CM | POA: Insufficient documentation

## 2023-04-29 MED ORDER — IOHEXOL 300 MG/ML  SOLN
100.0000 mL | Freq: Once | INTRAMUSCULAR | Status: AC | PRN
  Administered 2023-04-29: 100 mL via INTRAVENOUS

## 2023-05-05 ENCOUNTER — Telehealth: Payer: Self-pay

## 2023-05-05 NOTE — Telephone Encounter (Signed)
Called results of CT to Sandra Fischer. All questions answered.

## 2023-05-08 ENCOUNTER — Other Ambulatory Visit: Payer: No Typology Code available for payment source

## 2023-05-13 ENCOUNTER — Encounter: Payer: Self-pay | Admitting: Oncology

## 2023-06-07 ENCOUNTER — Emergency Department
Admission: EM | Admit: 2023-06-07 | Discharge: 2023-06-07 | Disposition: A | Discharge status: Home / Self Care | Payer: No Typology Code available for payment source | Attending: Emergency Medicine | Admitting: Emergency Medicine

## 2023-06-07 ENCOUNTER — Other Ambulatory Visit: Payer: Self-pay

## 2023-06-07 ENCOUNTER — Emergency Department: Payer: No Typology Code available for payment source

## 2023-06-07 DIAGNOSIS — J189 Pneumonia, unspecified organism: Secondary | ICD-10-CM | POA: Insufficient documentation

## 2023-06-07 DIAGNOSIS — R059 Cough, unspecified: Secondary | ICD-10-CM | POA: Diagnosis present

## 2023-06-07 MED ORDER — AZITHROMYCIN 250 MG PO TABS: ORAL_TABLET | ORAL | 0 refills | Status: DC

## 2023-06-07 MED ORDER — PREDNISONE 50 MG PO TABS: 50.0000 mg | ORAL_TABLET | Freq: Every day | ORAL | 0 refills | Status: DC

## 2023-06-07 MED ORDER — BENZONATATE 100 MG PO CAPS: 100.0000 mg | ORAL_CAPSULE | Freq: Three times a day (TID) | ORAL | 0 refills | Status: DC | PRN

## 2023-06-07 MED ORDER — AZITHROMYCIN 500 MG PO TABS
500.0000 mg | ORAL_TABLET | Freq: Once | ORAL | Status: AC
  Administered 2023-06-07: 500 mg via ORAL
  Filled 2023-06-07: qty 1

## 2023-06-07 MED ORDER — PSEUDOEPH-BROMPHEN-DM 30-2-10 MG/5ML PO SYRP: 10.0000 mL | ORAL_SOLUTION | Freq: Four times a day (QID) | ORAL | 0 refills | Status: DC | PRN

## 2023-06-07 NOTE — ED Triage Notes (Signed)
Pt to ed from home for cough, ear pain, and headache. Pt was seen 1 month ago and was diagnosed with bronchitis. Pt is still feeling bad and would like to make sure she doesn't have pneumonia now. Pt is caox4, in no acute distress in triage.

## 2023-06-07 NOTE — ED Provider Notes (Signed)
[facility] Provider Note  Patient Contact: 4:47 PM (approximate)   History   Cough and Nasal Congestion   HPI  Sandra Fischer is a 72 y.o. female who presents the emergency department complaining of cough.  Patient was seen and treated for bronchitis roughly a month ago.  Patient states that she did not improve after the steroids but has had ongoing cough and is concerned she could have pneumonia.  She has had this in the past.  No fevers, chills, chest pain, frank difficulty breathing, GI symptoms.  No other complaints at this time.     Physical Exam   Triage Vital Signs: ED Triage Vitals  Enc Vitals Group     BP 06/07/23 1459 (!) 173/82     Pulse Rate 06/07/23 1459 78     Resp 06/07/23 1459 18     Temp 06/07/23 1459 98.8 F (37.1 C)     Temp Source 06/07/23 1459 Oral     SpO2 06/07/23 1459 94 %     Weight 06/07/23 1500 198 lb (89.8 kg)     Height 06/07/23 1500 5\' 2"  (1.575 m)     Head Circumference --      Peak Flow --      Pain Score 06/07/23 1459 4     Pain Loc --      Pain Edu? --      Excl. in GC? --     Most recent vital signs: Vitals:   06/07/23 1459  BP: (!) 173/82  Pulse: 78  Resp: 18  Temp: 98.8 F (37.1 C)  SpO2: 94%     General: Alert and in no acute distress. ENT:      Ears:       Nose: No congestion/rhinnorhea.      Mouth/Throat: Mucous membranes are moist. Neck: No stridor. No cervical spine tenderness to palpation.  Cardiovascular:  Good peripheral perfusion Respiratory: Normal respiratory effort without tachypnea or retractions. Lungs CTAB. Good air entry to the bases with no decreased or absent breath sounds. Musculoskeletal: Full range of motion to all extremities.  Neurologic:  No gross focal neurologic deficits are appreciated.  Skin:   No rash noted Other:   ED Results / Procedures / Treatments   Labs (all labs ordered are listed, but only abnormal results are displayed) Labs Reviewed - No data to  display   EKG     RADIOLOGY  I personally viewed, evaluated, and interpreted these images as part of my medical decision making, as well as reviewing the written report by the radiologist.  ED Provider Interpretation: No acute cardiopulmonary finding on chest x-ray  DG Chest 2 View  Result Date: 06/07/2023 CLINICAL DATA:  Cough.  History of endometrial carcinoma EXAM: CHEST - 2 VIEW COMPARISON:  X-ray 04/17/2023.  CT scan 04/29/2023 FINDINGS: No consolidation, pneumothorax or effusion. No edema. Normal cardiopericardial silhouette. Tiny nodules on CT are not well seen on this x-ray. Air-fluid level along the stomach beneath the left hemidiaphragm. Surgical clips in the right upper quadrant of the abdomen. IMPRESSION: No acute cardiopulmonary disease. Electronically Signed   By: Karen Kays M.D.   On: 06/07/2023 15:38    PROCEDURES:  Critical Care performed: No  Procedures   MEDICATIONS ORDERED IN ED: Medications  azithromycin (ZITHROMAX) tablet 500 mg (has no administration in time range)     IMPRESSION / MDM / ASSESSMENT AND PLAN / ED COURSE  I reviewed the triage vital signs and the nursing notes.  Differential diagnosis includes, but is not limited to, pneumonia, bronchitis, viral URI   Patient's presentation is most consistent with acute presentation with potential threat to life or bodily function.   Patient's diagnosis is consistent with walking pneumonia.  Patient presents emergency department month after having bronchitis.  Patient states that she improved though has ongoing symptoms.  Overall and patient's exam was reassuring, no acute consolidation on chest x-ray.  However given the length of symptoms I do suspect that she has a bacterial component.  Will treat for walking pneumonia.  Patient will have prescription for antibiotic, steroid, cough medication.  She has an albuterol inhaler and she is encouraged to use same to help with  symptoms as well.  Follow-up with primary care as needed.   Patient is given ED precautions to return to the ED for any worsening or new symptoms.     FINAL CLINICAL IMPRESSION(S) / ED DIAGNOSES   Final diagnoses:  Community acquired pneumonia, unspecified laterality     Rx / DC Orders   ED Discharge Orders          Ordered    azithromycin (ZITHROMAX Z-PAK) 250 MG tablet        06/07/23 1656    predniSONE (DELTASONE) 50 MG tablet  Daily with breakfast        06/07/23 1656    benzonatate (TESSALON PERLES) 100 MG capsule  3 times daily PRN        06/07/23 1656    brompheniramine-pseudoephedrine-DM 30-2-10 MG/5ML syrup  4 times daily PRN        06/07/23 1656             Note:  This document was prepared using Dragon voice recognition software and may include unintentional dictation errors.   Racheal Patches, PA-C 06/07/23 1657    Georga Hacking, MD 06/07/23 1944

## 2023-06-07 NOTE — ED Notes (Signed)
Pt is refusing straight stick in triage due to "being a difficult stick. I would rather wait for my IV to get blood then.

## 2023-06-15 ENCOUNTER — Other Ambulatory Visit: Payer: Self-pay

## 2023-06-15 ENCOUNTER — Encounter: Payer: Self-pay | Admitting: Radiology

## 2023-06-15 ENCOUNTER — Emergency Department
Admission: EM | Admit: 2023-06-15 | Discharge: 2023-06-15 | Disposition: A | Discharge status: Home / Self Care | Payer: No Typology Code available for payment source | Attending: Student in an Organized Health Care Education/Training Program | Admitting: Student in an Organized Health Care Education/Training Program

## 2023-06-15 DIAGNOSIS — K625 Hemorrhage of anus and rectum: Secondary | ICD-10-CM | POA: Insufficient documentation

## 2023-06-15 DIAGNOSIS — K649 Unspecified hemorrhoids: Secondary | ICD-10-CM | POA: Insufficient documentation

## 2023-06-15 LAB — CBC
HCT: 44.2 % (ref 36.0–46.0)
Hemoglobin: 13.5 g/dL (ref 12.0–15.0)
MCH: 26.6 pg (ref 26.0–34.0)
MCHC: 30.5 g/dL (ref 30.0–36.0)
MCV: 87 fL (ref 80.0–100.0)
Platelets: 254 10*3/uL (ref 150–400)
RBC: 5.08 MIL/uL (ref 3.87–5.11)
RDW: 15.8 % — ABNORMAL HIGH (ref 11.5–15.5)
WBC: 10.7 10*3/uL — ABNORMAL HIGH (ref 4.0–10.5)
nRBC: 0 % (ref 0.0–0.2)

## 2023-06-15 LAB — COMPREHENSIVE METABOLIC PANEL
ALT: 16 U/L (ref 0–44)
AST: 16 U/L (ref 15–41)
Albumin: 3.7 g/dL (ref 3.5–5.0)
Alkaline Phosphatase: 85 U/L (ref 38–126)
Anion gap: 9 (ref 5–15)
BUN: 26 mg/dL — ABNORMAL HIGH (ref 8–23)
CO2: 27 mmol/L (ref 22–32)
Calcium: 8.6 mg/dL — ABNORMAL LOW (ref 8.9–10.3)
Chloride: 102 mmol/L (ref 98–111)
Creatinine, Ser: 0.84 mg/dL (ref 0.44–1.00)
GFR, Estimated: >60 mL/min (ref >60)
Glucose, Bld: 130 mg/dL — ABNORMAL HIGH (ref 70–99)
Potassium: 3.4 mmol/L — ABNORMAL LOW (ref 3.5–5.1)
Sodium: 138 mmol/L (ref 135–145)
Total Bilirubin: 0.6 mg/dL (ref 0.3–1.2)
Total Protein: 7.2 g/dL (ref 6.5–8.1)

## 2023-06-15 LAB — TYPE AND SCREEN
ABO/RH(D): O POS
Antibody Screen: NEGATIVE

## 2023-06-15 MED ORDER — HYDROCORTISONE ACETATE 25 MG RE SUPP: 25.0000 mg | Freq: Two times a day (BID) | RECTAL | 1 refills | Status: AC

## 2023-06-15 NOTE — ED Triage Notes (Signed)
Pt states she strained to use the bathroom and had bright red blood drip into the commode on three different occasions. PT does have hemorrhoids and believes that's what it is.

## 2023-06-15 NOTE — ED Provider Notes (Signed)
[facility] Provider Note    Event Date/Time   First MD Initiated Contact with Patient 06/15/23 1935     (approximate)   History   Rectal Bleeding   HPI  Sandra Fischer is a 72 y.o. female with a history of ulcers as well is hemorrhoids presents to the ER for evaluation of noted some bright blood per rectum in the toilet today.  Thought like she strained moving her bowels this morning passing hard stool and then noted at the end there was a streak of blood.  Few hours later she had another bowel movement this time it was more blood but no melena.  She had a third bowel movement around 4:00 this afternoon where it had significantly decreased she does not feel like she is bleeding right now does not feel weak no chest pain or shortness of breath no abdominal pain but wanted to be evaluated.     Physical Exam   Triage Vital Signs: ED Triage Vitals  Enc Vitals Group     BP 06/15/23 1900 (!) 151/81     Pulse Rate 06/15/23 1900 68     Resp 06/15/23 1900 17     Temp 06/15/23 1900 98.4 F (36.9 C)     Temp Source 06/15/23 1900 Oral     SpO2 06/15/23 1900 95 %     Weight 06/15/23 1859 193 lb (87.5 kg)     Height 06/15/23 1859 5\' 2"  (1.575 m)     Head Circumference --      Peak Flow --      Pain Score --      Pain Loc --      Pain Edu? --      Excl. in GC? --     Most recent vital signs: Vitals:   06/15/23 1900  BP: (!) 151/81  Pulse: 68  Resp: 17  Temp: 98.4 F (36.9 C)  SpO2: 95%     Constitutional: Alert  Eyes: Conjunctivae are normal.  Head: Atraumatic. Nose: No congestion/rhinnorhea. Mouth/Throat: Mucous membranes are moist.   Neck: Painless ROM.  Cardiovascular:   Good peripheral circulation. Respiratory: Normal respiratory effort.  No retractions.  Gastrointestinal: Soft and nontender.  Multiple nonthrombosed hemorrhoids no evidence of active bleeding.  No blood on DRE. Musculoskeletal:  no deformity Neurologic:  MAE spontaneously.  No gross focal neurologic deficits are appreciated.  Skin:  Skin is warm, dry and intact. No rash noted. Psychiatric: Mood and affect are normal. Speech and behavior are normal.    ED Results / Procedures / Treatments   Labs (all labs ordered are listed, but only abnormal results are displayed) Labs Reviewed  COMPREHENSIVE METABOLIC PANEL - Abnormal; Notable for the following components:      Result Value   Potassium 3.4 (*)    Glucose, Bld 130 (*)    BUN 26 (*)    Calcium 8.6 (*)    All other components within normal limits  CBC - Abnormal; Notable for the following components:   WBC 10.7 (*)    RDW 15.8 (*)    All other components within normal limits  POC OCCULT BLOOD, ED  TYPE AND SCREEN     EKG     RADIOLOGY    PROCEDURES:  Critical Care performed:   Procedures   MEDICATIONS ORDERED IN ED: Medications - No data to display   IMPRESSION / MDM / ASSESSMENT AND PLAN / ED COURSE  I reviewed the triage vital signs and  the nursing notes.                              Differential diagnosis includes, but is not limited to, hemorrhoid, mass, proctitis, LGIB, UGIB  Patient presenting to the ER for evaluation of symptoms as described above.  Based on symptoms, risk factors and considered above differential, this presenting complaint could reflect a potentially life-threatening illness therefore the patient will be placed on continuous pulse oximetry and telemetry for monitoring.  Laboratory evaluation will be sent to evaluate for the above complaints.  Patient is well-appearing nontoxic.  Hemoglobin stable.  She is not having any melena or hematochezia.  She does have hemorrhoids and based on her history of straining with constipation this morning then having episode of bleeding that has now resolved I suspect that she was having bleeding from her hemorrhoids.  She does appears stable and appropriate for outpatient management at this time.  We discussed strict return  precautions.  Patient agreeable to plan.        FINAL CLINICAL IMPRESSION(S) / ED DIAGNOSES   Final diagnoses:  Hemorrhoids, unspecified hemorrhoid type  Rectal bleeding     Rx / DC Orders   ED Discharge Orders          Ordered    hydrocortisone (ANUSOL-HC) 25 MG suppository  Every 12 hours        06/15/23 2025             Note:  This document was prepared using Dragon voice recognition software and may include unintentional dictation errors.    Willy Eddy, MD 06/15/23 2028

## 2023-07-08 ENCOUNTER — Ambulatory Visit: Payer: Medicare Other | Admitting: Podiatry

## 2023-07-15 ENCOUNTER — Encounter: Payer: Self-pay | Admitting: Podiatry

## 2023-07-15 ENCOUNTER — Ambulatory Visit: Payer: Medicare Other | Admitting: Podiatry

## 2023-07-15 DIAGNOSIS — M79675 Pain in left toe(s): Secondary | ICD-10-CM

## 2023-07-15 DIAGNOSIS — E1142 Type 2 diabetes mellitus with diabetic polyneuropathy: Secondary | ICD-10-CM

## 2023-07-15 DIAGNOSIS — M79674 Pain in right toe(s): Secondary | ICD-10-CM | POA: Diagnosis not present

## 2023-07-15 DIAGNOSIS — B351 Tinea unguium: Secondary | ICD-10-CM

## 2023-07-15 NOTE — Progress Notes (Signed)
This patient returns to my office for at risk foot care.  This patient requires this care by a professional since this patient will be at risk due to having diet controlled diabetes.   This patient is unable to cut nails herself since the patient cannot reach her nails.These nails are painful walking and wearing shoes.  This patient presents for at risk foot care today.  General Appearance  Alert, conversant and in no acute stress.  Vascular  Dorsalis pedis and posterior tibial  pulses are palpable  bilaterally.  Capillary return is within normal limits  bilaterally. Temperature is within normal limits  bilaterally.  Neurologic  Senn-Weinstein monofilament wire test WNL  bilaterally. Muscle power within normal limits bilaterally.  Nails Thick disfigured discolored nails with subungual debris  from hallux to fifth toes bilaterally. No evidence of bacterial infection or drainage bilaterally.  Orthopedic  No limitations of motion  feet .  No crepitus or effusions noted.  No bony pathology or digital deformities noted.  HAV  B/L.  Skin  normotropic skin with no porokeratosis noted bilaterally.  No signs of infections or ulcers noted.     Onychomycosis  Pain in right toes  Pain in left toes  Diabetes    Consent was obtained for treatment procedures.   Mechanical debridement of nails 1-5  bilaterally performed with a nail nipper.  Filed with dremel without incident.     Return office visit   10 weeks                  Told patient to return for periodic foot care and evaluation due to potential at risk complications.   Gregory Mayer DPM  

## 2023-08-14 ENCOUNTER — Inpatient Hospital Stay: Payer: Medicare Other | Attending: Radiation Oncology

## 2023-08-14 DIAGNOSIS — Z8542 Personal history of malignant neoplasm of other parts of uterus: Secondary | ICD-10-CM | POA: Diagnosis present

## 2023-08-14 DIAGNOSIS — Z87891 Personal history of nicotine dependence: Secondary | ICD-10-CM | POA: Insufficient documentation

## 2023-08-14 DIAGNOSIS — Z90710 Acquired absence of both cervix and uterus: Secondary | ICD-10-CM | POA: Insufficient documentation

## 2023-08-14 DIAGNOSIS — C541 Malignant neoplasm of endometrium: Secondary | ICD-10-CM

## 2023-08-14 LAB — COMPREHENSIVE METABOLIC PANEL
ALT: 13 U/L (ref 0–44)
AST: 16 U/L (ref 15–41)
Albumin: 4 g/dL (ref 3.5–5.0)
Alkaline Phosphatase: 84 U/L (ref 38–126)
Anion gap: 10 (ref 5–15)
BUN: 23 mg/dL (ref 8–23)
CO2: 24 mmol/L (ref 22–32)
Calcium: 9.4 mg/dL (ref 8.9–10.3)
Chloride: 102 mmol/L (ref 98–111)
Creatinine, Ser: 0.91 mg/dL (ref 0.44–1.00)
GFR, Estimated: >60 mL/min (ref >60)
Glucose, Bld: 121 mg/dL — ABNORMAL HIGH (ref 70–99)
Potassium: 3.4 mmol/L — ABNORMAL LOW (ref 3.5–5.1)
Sodium: 136 mmol/L (ref 135–145)
Total Bilirubin: 0.6 mg/dL (ref 0.3–1.2)
Total Protein: 8.3 g/dL — ABNORMAL HIGH (ref 6.5–8.1)

## 2023-08-14 LAB — CBC WITH DIFFERENTIAL/PLATELET
Abs Immature Granulocytes: 0.02 10*3/uL (ref 0.00–0.07)
Basophils Absolute: 0 10*3/uL (ref 0.0–0.1)
Basophils Relative: 1 %
Eosinophils Absolute: 0.2 10*3/uL (ref 0.0–0.5)
Eosinophils Relative: 2 %
HCT: 40.9 % (ref 36.0–46.0)
Hemoglobin: 12.7 g/dL (ref 12.0–15.0)
Immature Granulocytes: 0 %
Lymphocytes Relative: 44 %
Lymphs Abs: 3.6 10*3/uL (ref 0.7–4.0)
MCH: 27.1 pg (ref 26.0–34.0)
MCHC: 31.1 g/dL (ref 30.0–36.0)
MCV: 87.4 fL (ref 80.0–100.0)
Monocytes Absolute: 1 10*3/uL (ref 0.1–1.0)
Monocytes Relative: 12 %
Neutro Abs: 3.3 10*3/uL (ref 1.7–7.7)
Neutrophils Relative %: 41 %
Platelets: 259 10*3/uL (ref 150–400)
RBC: 4.68 MIL/uL (ref 3.87–5.11)
RDW: 15.6 % — ABNORMAL HIGH (ref 11.5–15.5)
WBC: 8.1 10*3/uL (ref 4.0–10.5)
nRBC: 0 % (ref 0.0–0.2)

## 2023-08-15 LAB — CA 125: Cancer Antigen (CA) 125: 12.6 U/mL (ref 0.0–38.1)

## 2023-08-17 ENCOUNTER — Encounter: Payer: Self-pay | Admitting: Oncology

## 2023-08-17 ENCOUNTER — Inpatient Hospital Stay: Payer: Medicare Other

## 2023-08-17 ENCOUNTER — Inpatient Hospital Stay (HOSPITAL_BASED_OUTPATIENT_CLINIC_OR_DEPARTMENT_OTHER): Payer: Medicare Other | Admitting: Oncology

## 2023-08-17 VITALS — BP 121/60 | HR 64 | Temp 97.8°F | Resp 18 | Wt 206.7 lb

## 2023-08-17 DIAGNOSIS — T451X5A Adverse effect of antineoplastic and immunosuppressive drugs, initial encounter: Secondary | ICD-10-CM

## 2023-08-17 DIAGNOSIS — Z8542 Personal history of malignant neoplasm of other parts of uterus: Secondary | ICD-10-CM | POA: Diagnosis not present

## 2023-08-17 DIAGNOSIS — C541 Malignant neoplasm of endometrium: Secondary | ICD-10-CM

## 2023-08-17 DIAGNOSIS — M069 Rheumatoid arthritis, unspecified: Secondary | ICD-10-CM | POA: Diagnosis not present

## 2023-08-17 DIAGNOSIS — G620 Drug-induced polyneuropathy: Secondary | ICD-10-CM

## 2023-08-17 NOTE — Assessment & Plan Note (Signed)
follow-up rheumatologist. - off Prednisone, now on Enbrel.

## 2023-08-17 NOTE — Assessment & Plan Note (Addendum)
Recurrent endometrial cancer Status post 6 cycles of chemotherapy with carboplatin and Taxol. Post chemotherapy CT scan showed excellent response and resolution of lymphadenopathy. 11/15/ 2022, CT scan showed no disease progression or recurrence. 04/05/2022, CT angiogram chest PE program showed no PE, peripheral bronchi secondary suggesting of acute bronchitis.  No evidence of consolidating pneumonia or pulmonary edema. Labs are reviewed and discussed with patient. CA125 has been stable. May 2024 CT chest abdomen pelvis w contrast- no recurrence Continue alternate follow-up with GYN oncology and Oncology every 6 months.

## 2023-08-17 NOTE — Progress Notes (Signed)
Hematology/Oncology Progress note [phone] [phone]   [facility]  CHIEF COMPLAINTS/REASON FOR VISIT:   ASSESSMENT & PLAN:   Endometrial cancer (HCC) Recurrent endometrial cancer Status post 6 cycles of chemotherapy with carboplatin and Taxol. Post chemotherapy CT scan showed excellent response and resolution of lymphadenopathy. 11/15/ 2022, CT scan showed no disease progression or recurrence. 04/05/2022, CT angiogram chest PE program showed no PE, peripheral bronchi secondary suggesting of acute bronchitis.  No evidence of consolidating pneumonia or pulmonary edema. Labs are reviewed and discussed with patient. CA125 has been stable. May 2024 CT chest abdomen pelvis w contrast- no recurrence Continue alternate follow-up with GYN oncology and Oncology every 6 months.  Rheumatoid arthritis (HCC) follow-up rheumatologist. - off Prednisone, now on Enbrel.  Neuropathy due to chemotherapeutic drug [facility]) #Neuropathy due to chemotherapy.   Stable to improved.  She has self discontinued gabapentin, manageable symptoms.   Orders Placed This Encounter  Procedures   CA 125    Standing Status:   Future    Standing Expiration Date:   08/16/2024   CBC with Differential (Cancer Center Only)    Standing Status:   Future    Standing Expiration Date:   08/16/2024   CMP (Cancer Center only)    Standing Status:   Future    Standing Expiration Date:   08/16/2024    All questions were answered. The patient knows to call the clinic with any problems, questions or concerns.  [name], MD, PhD [facility] Hematology Oncology 08/17/2023   Follow up for endometrial cancer HISTORY OF PRESENTING ILLNESS:   Sandra Fischer is a  72 y.o.  female with PMH listed below was seen in consultation at the request of  [facility], [facility]  for evaluation of endometrial cancer Patient had TH/BSO sentinel lymph node biopsy by [facility] GYN oncology Dr. Myrtie Neither on  06/21/2019. Extensive medical records from [facility] health system review was performed by me.  Tumor size 9.3 cm, invading 99% of myometrium [3.55 out of 3.6 cm], positive right external iliac sentinel lymph nodes.  Negative washing.  No LVI, serosa is uninvolved. 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 [id number]  FIGO stage IIIC1 MSI intact HER 2 negative.   Patient was seen by Dr. Johnnette Litter on 07/06/2019 due to vaginal odor. Patient presents for establishing care and discussion for adjuvant chemotherapy radiation.  Per Dr. Annye Rusk note, her case was discussed at [facility] tumor board on 07/04/2019.  Portex 3 regimen was recommended given subgroup analysis showing benefit in the serous histology versus clinical trial. HER2 was ordered and pending results.   Patient has a history of SVT.  10/14/2018 2D echo reviewed moderately reduced LV function with LVEF 35 to 40% with diffuse hypokinesis.  Patient had Lexiscan Myoview on 01/19/2019.  LVEF 49%.  SPECT images revealed a mild fixed inferior wall defect, scar versus artifact.  No evidence of ischemia.  She follows up with cardiology Dr.Paraschos 04/26/2019 with a plan to stay on her current medication. 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 She is married and providing care for her mother.  # 10/20/2019 CT chest abdomen pelvis with contrast showed disease recurrence.  There are multiple newly enlarged retroperitoneal and right iliac lymph nodes the largest the left retroperitoneal node measuring 1.9 x 1.6 cm concerning for nodal metastasis disease.  Unchanged 4 mm groundglass pulmonary nodule of the left pulmonary apex.  This remains nonspecific. #Cardiomyopathy, LVEF35 to 40% Last seen by  cardiology 04/26/2019.  Recommend patient to continue follow-up with cardiology.  # GERD  ER visit on 11/13/2019 due to chest pain which started after he ate at a restaurant and developed burning sensation in her  chest.  No associated dyspnea. Patient had a CTA which showed no pulmonary embolism.  Patient symptoms got better after GI cocktail.  Was considered to be secondary to acid reflux.  Patient takes omeprazole 20 mg daily.   # 11/03/2019 -02/20/2020 S/p 6 cycles of chemotherapy with carboplatin and Taxol. Postchemotherapy CT scan showed excellent treatment response.   I discussed with Dr.Secord, according to data from GOG 258,  there were no differences in relapse-free survival fewer lower vaginal recurrences and fewer lower pelvic and para-aortic relapses with the addition of RT  11/11/2021, CT chest abdomen pelvis showed stable examination without new/progressive diseases.  Stable benign small bilateral pulmonary nodules.  Left-sided colonic diverticulosis without findings of acute radiculitis.  Aortic atherosclerosis.  04/05/2022, CT angiogram chest PE program showed no PE, peripheral bronchi secondary suggesting of acute bronchitis.  No evidence of consolidating pneumonia or pulmonary edema.  INTERVAL HISTORY Sandra Fischer is a 72 y.o. female who has above history reviewed by me today presents for follow up visit for management of endometrial cancer. Patient has neuropathy, stable symptoms, off gabapentin.  Rheumatoid arthritis, follows up with rheumatology, off Prednisone, now on Enbrel.   Review of Systems  Constitutional:  Positive for fatigue. Negative for appetite change, chills and fever.  HENT:   Negative for hearing loss and voice change.   Eyes:  Negative for eye problems.  Respiratory:  Negative for chest tightness and cough.   Cardiovascular:  Negative for chest pain.  Gastrointestinal:  Negative for abdominal distention, abdominal pain and blood in stool.  Endocrine: Negative for hot flashes.  Genitourinary:  Negative for difficulty urinating and frequency.   Musculoskeletal:  Negative for arthralgias.  Skin:  Negative for itching and rash.  Neurological:  Positive for numbness.  Negative for extremity weakness.  Hematological:  Negative for adenopathy.  Psychiatric/Behavioral:  Negative for confusion.     MEDICAL HISTORY:  Past Medical History:  Diagnosis Date   Asthma    Asthma    Asthma exacerbation 08/22/2016   Cancer (HCC)    Collagen vascular disease (HCC)    Diabetes mellitus without complication (HCC)    History of Diabetes    Dyspnea    Environmental allergies    Fibromyalgia    High cholesterol    Hypertension    Hypothyroidism    Iron deficiency anemia 07/03/2020   Neuropathy    RA (rheumatoid arthritis) (HCC)    Sleep apnea    Thyroid disease     SURGICAL HISTORY: Past Surgical History:  Procedure Laterality Date   ABDOMINAL HYSTERECTOMY     CATARACT EXTRACTION     CHOLECYSTECTOMY     COLONOSCOPY WITH PROPOFOL N/A 03/14/2022   Procedure: COLONOSCOPY WITH PROPOFOL;  Surgeon: Regis Bill, MD;  Location: [facility] ENDOSCOPY;  Service: Endoscopy;  Laterality: N/A;  DM   CYSTOURETHROSCOPY     DIAGNOSTIC LAPAROSCOPY     EYE SURGERY     fibroids removed     PORTA CATH REMOVAL N/A 02/14/2021   Procedure: PORTA CATH REMOVAL;  Surgeon: Annice Needy, MD;  Location: [facility] INVASIVE CV LAB;  Service: Cardiovascular;  Laterality: N/A;   THYROIDECTOMY, PARTIAL     tummy tuck      SOCIAL HISTORY: Social History   Socioeconomic History  Marital status: Married    Spouse name: Cedric    Number of children: Not on file   Years of education: Not on file   Highest education level: Not on file  Occupational History   Occupation: Retired  Tobacco Use   Smoking status: Former    Current packs/day: 0.00    Average packs/day: 0.1 packs/day for 0.2 years    Types: Cigarettes    Start date: 09/1978    Quit date: 1980    Years since quitting: 44.6   Smokeless tobacco: Never  Vaping Use   Vaping status: Never Used  Substance and Sexual Activity   Alcohol use: No   Drug use: No   Sexual activity: Yes    Birth control/protection:  Post-menopausal  Other Topics Concern   Not on file  Social History Narrative   Not on file   Social Determinants of Health   Financial Resource Strain: Not on file  Food Insecurity: Not on file  Transportation Needs: Not on file  Physical Activity: Not on file  Stress: Not on file  Social Connections: Not on file  Intimate Partner Violence: Not on file    FAMILY HISTORY: Family History  Problem Relation Age of Onset   Diabetes Mother    Hypertension Mother    Hyperlipidemia Mother    Dementia Mother    Breast cancer Neg Hx    Ovarian cancer Neg Hx    Colon cancer Neg Hx     ALLERGIES:  is allergic to abatacept, codeine, iodine, latex, shellfish allergy, trimethoprim hydrochloride [trimethoprim], and penicillins.  MEDICATIONS:  Current Outpatient Medications  Medication Sig Dispense Refill   Acetaminophen (TYLENOL EXTRA STRENGTH PO) Take 1,300 mg by mouth 2 (two) times daily as needed.     ascorbic acid (VITAMIN C) 500 MG tablet Take 500 mg by mouth daily.     aspirin EC 81 MG tablet Take 81 mg by mouth daily.     azelastine (ASTELIN) 0.1 % nasal spray Place 2 sprays into both nostrils 2 (two) times daily. 30 mL 5   Calcium Carbonate-Vit D-Min (CALCIUM 600+D PLUS MINERALS) 600-400 MG-UNIT TABS Take 1 tablet by mouth 2 (two) times daily.     cetirizine (ZYRTEC) 10 MG tablet Take 1 tablet (10 mg total) by mouth daily. 30 tablet 0   Cholecalciferol (D3 ADULT PO) Take 1 capsule by mouth daily.     clotrimazole-betamethasone (LOTRISONE) cream Apply topically 4 (four) times daily as needed.     desloratadine (CLARINEX) 5 MG tablet Take 5 mg by mouth daily.     docusate sodium (COLACE) 50 MG capsule Take 50 mg by mouth 2 (two) times daily.     empagliflozin (JARDIANCE) 25 MG TABS tablet Take 12.5 mg by mouth daily.     etanercept (ENBREL) 25 MG injection Inject 25 mg into the skin.     fluticasone (FLONASE) 50 MCG/ACT nasal spray Place 1 spray into both nostrils 2 (two) times  daily. 16 g 0   fluticasone-salmeterol (ADVAIR) 250-50 MCG/ACT AEPB Inhale 1 puff into the lungs in the morning and at bedtime.     furosemide (LASIX) 10 MG/ML solution Take by mouth daily.     gabapentin (NEURONTIN) 300 MG capsule Take 1 capsule (300 mg total) by mouth 3 (three) times daily. 90 capsule 1   hydrALAZINE (APRESOLINE) 25 MG tablet Take 25 mg by mouth 2 (two) times daily.     hydrocortisone (ANUSOL-HC) 25 MG suppository Place 1 suppository (25 mg total)  rectally every 12 (twelve) hours. 12 suppository 1   ipratropium (ATROVENT) 0.02 % nebulizer solution Take 0.5 mg by nebulization every 4 (four) hours as needed.      leflunomide (ARAVA) 20 MG tablet Take 20 mg by mouth daily.     leflunomide (ARAVA) 20 MG tablet Take 1 tablet by mouth daily.     levothyroxine (SYNTHROID) 75 MCG tablet Take 75 mcg by mouth daily before breakfast.     lisinopril (PRINIVIL,ZESTRIL) 40 MG tablet Take 20 mg by mouth in the morning and at bedtime.      metoprolol succinate (TOPROL-XL) 50 MG 24 hr tablet Take 50 mg by mouth daily. Take with or immediately following a meal.     metroNIDAZOLE (METROGEL VAGINAL) 0.75 % vaginal gel Insert 5 grams (37.5 mg of metronidazole) of gel into vagina once daily at bedtime for five days 70 g 0   montelukast (SINGULAIR) 10 MG tablet Take 10 mg by mouth at bedtime.     nitroGLYCERIN (NITROSTAT) 0.4 MG SL tablet Place under the tongue. Place 1 tablet (0.4 mg total) under the tongue every 5 (five) minutes as needed for Chest pain May take up to 3 doses.     nortriptyline (PAMELOR) 25 MG capsule Take 25 mg by mouth at bedtime.     olopatadine (PATANOL) 0.1 % ophthalmic solution Place 1 drop into both eyes 2 (two) times daily. 5 mL 5   omeprazole (PRILOSEC) 10 MG capsule Take 20 mg by mouth daily.     pravastatin (PRAVACHOL) 40 MG tablet Take 40 mg by mouth daily.     Prenatal Vit-Fe Fumarate-FA (MULTIVITAMIN-PRENATAL) 27-0.8 MG TABS tablet Take 1 tablet by mouth daily.       senna-docusate (SENOKOT-S) 8.6-50 MG tablet Take 1 tablet by mouth at bedtime as needed for mild constipation.     sulfaSALAzine (AZULFIDINE) 500 MG tablet TAKE ONE TABLET BY MOUTH TWO TIMES A DAY FOR 14 DAYS, THEN TAKE TWO TABLETS TWO TIMES A DAY FOR 14 DAYS, THEN TAKE THREE TABLETS TWO TIMES A DAY FOR 62 DAYS FOR INFLAMMATION. TAKE AFTER MEALS     tiotropium (SPIRIVA) 18 MCG inhalation capsule Place 18 mcg into inhaler and inhale daily.     verapamil (CALAN) 120 MG tablet Take 240 mg by mouth 2 (two) times daily.     azithromycin (ZITHROMAX Z-PAK) 250 MG tablet Take 2 tablets (500 mg) on  Day 1,  followed by 1 tablet (250 mg) once daily on Days 2 through 5. (Patient not taking: Reported on 08/17/2023) 6 each 0   benzonatate (TESSALON PERLES) 100 MG capsule Take 1 capsule (100 mg total) by mouth 3 (three) times daily as needed for cough. (Patient not taking: Reported on 08/17/2023) 30 capsule 0   brompheniramine-pseudoephedrine-DM 30-2-10 MG/5ML syrup Take 10 mLs by mouth 4 (four) times daily as needed. (Patient not taking: Reported on 08/17/2023) 200 mL 0   EPINEPHrine 0.3 mg/0.3 mL IJ SOAJ injection Use as directed for severe allergic reaction. (Patient not taking: Reported on 02/09/2023) 2 Device 1   predniSONE (DELTASONE) 50 MG tablet Take 1 tablet (50 mg total) by mouth daily with breakfast. (Patient not taking: Reported on 08/17/2023) 5 tablet 0   No current facility-administered medications for this visit.     PHYSICAL EXAMINATION: ECOG PERFORMANCE STATUS: 1 - Symptomatic but completely ambulatory  Physical Exam Constitutional:      General: She is not in acute distress.    Appearance: She is obese.  HENT:  Head: Normocephalic and atraumatic.  Eyes:     General: No scleral icterus. Cardiovascular:     Rate and Rhythm: Normal rate and regular rhythm.     Heart sounds: Normal heart sounds.  Pulmonary:     Effort: Pulmonary effort is normal. No respiratory distress.     Breath  sounds: No wheezing.  Abdominal:     General: Bowel sounds are normal. There is no distension.     Palpations: Abdomen is soft.  Musculoskeletal:        General: Deformity present. Normal range of motion.     Cervical back: Normal range of motion and neck supple.  Skin:    General: Skin is warm and dry.     Findings: No erythema or rash.  Neurological:     Mental Status: She is alert and oriented to person, place, and time. Mental status is at baseline.     Cranial Nerves: No cranial nerve deficit.     Coordination: Coordination normal.  Psychiatric:        Mood and Affect: Mood normal.     LABORATORY DATA:  I have reviewed the data as listed    Latest Ref Rng & Units 08/14/2023    2:22 PM 06/15/2023    7:02 PM 04/17/2023    6:53 PM  CBC  WBC 4.0 - 10.5 K/uL 8.1  10.7  5.9   Hemoglobin 12.0 - 15.0 g/dL 09.8  11.9  14.7   Hematocrit 36.0 - 46.0 % 40.9  44.2  42.9   Platelets 150 - 400 K/uL 259  254  241       Latest Ref Rng & Units 08/14/2023    2:22 PM 06/15/2023    7:02 PM 04/17/2023    6:53 PM  CMP  Glucose 70 - 99 mg/dL 829  562  130   BUN 8 - 23 mg/dL 23  26  12    Creatinine 0.44 - 1.00 mg/dL 8.65  7.84  6.96   Sodium 135 - 145 mmol/L 136  138  138   Potassium 3.5 - 5.1 mmol/L 3.4  3.4  3.5   Chloride 98 - 111 mmol/L 102  102  103   CO2 22 - 32 mmol/L 24  27  24    Calcium 8.9 - 10.3 mg/dL 9.4  8.6  9.2   Total Protein 6.5 - 8.1 g/dL 8.3  7.2    Total Bilirubin 0.3 - 1.2 mg/dL 0.6  0.6    Alkaline Phos 38 - 126 U/L 84  85    AST 15 - 41 U/L 16  16    ALT 0 - 44 U/L 13  16       Iron/TIBC/Ferritin/ %Sat    Component Value Date/Time   IRON 43 01/02/2021 1331   TIBC 200 (L) 01/02/2021 1331   FERRITIN 759 (H) 01/02/2021 1331   IRONPCTSAT 22 01/02/2021 1331      RADIOGRAPHIC STUDIES: I have personally reviewed the radiological images as listed and agreed with the findings in the report., DG Chest 2 View  Result Date: 06/07/2023 CLINICAL DATA:  Cough.  History  of endometrial carcinoma EXAM: CHEST - 2 VIEW COMPARISON:  X-ray 04/17/2023.  CT scan 04/29/2023 FINDINGS: No consolidation, pneumothorax or effusion. No edema. Normal cardiopericardial silhouette. Tiny nodules on CT are not well seen on this x-ray. Air-fluid level along the stomach beneath the left hemidiaphragm. Surgical clips in the right upper quadrant of the abdomen. IMPRESSION: No acute cardiopulmonary disease. Electronically Signed  By: Karen Kays M.D.   On: 06/07/2023 15:38

## 2023-08-17 NOTE — Assessment & Plan Note (Signed)
#  Neuropathy due to chemotherapy.   Stable to improved.  She has self discontinued gabapentin, manageable symptoms.

## 2023-09-24 ENCOUNTER — Ambulatory Visit (INDEPENDENT_AMBULATORY_CARE_PROVIDER_SITE_OTHER): Payer: Medicare Other | Admitting: Podiatry

## 2023-10-01 ENCOUNTER — Encounter: Payer: Self-pay | Admitting: Oncology

## 2023-10-30 ENCOUNTER — Ambulatory Visit (INDEPENDENT_AMBULATORY_CARE_PROVIDER_SITE_OTHER): Payer: Medicare PPO | Admitting: Podiatry

## 2023-10-30 ENCOUNTER — Encounter: Payer: Self-pay | Admitting: Podiatry

## 2023-10-30 DIAGNOSIS — M79674 Pain in right toe(s): Secondary | ICD-10-CM

## 2023-10-30 DIAGNOSIS — E1142 Type 2 diabetes mellitus with diabetic polyneuropathy: Secondary | ICD-10-CM

## 2023-10-30 DIAGNOSIS — B351 Tinea unguium: Secondary | ICD-10-CM | POA: Diagnosis not present

## 2023-10-30 DIAGNOSIS — M79675 Pain in left toe(s): Secondary | ICD-10-CM

## 2023-10-30 NOTE — Progress Notes (Signed)
This patient returns to my office for at risk foot care.  This patient requires this care by a professional since this patient will be at risk due to having diet controlled diabetes.   This patient is unable to cut nails herself since the patient cannot reach her nails.These nails are painful walking and wearing shoes.  This patient presents for at risk foot care today.  General Appearance  Alert, conversant and in no acute stress.  Vascular  Dorsalis pedis and posterior tibial  pulses are palpable  bilaterally.  Capillary return is within normal limits  bilaterally. Temperature is within normal limits  bilaterally.  Neurologic  Senn-Weinstein monofilament wire test WNL  bilaterally. Muscle power within normal limits bilaterally.  Nails Thick disfigured discolored nails with subungual debris  from hallux to fifth toes bilaterally. No evidence of bacterial infection or drainage bilaterally.  Orthopedic  No limitations of motion  feet .  No crepitus or effusions noted.  No bony pathology or digital deformities noted.  HAV  B/L.  Skin  normotropic skin with no porokeratosis noted bilaterally.  No signs of infections or ulcers noted.     Onychomycosis  Pain in right toes  Pain in left toes  Diabetes    Consent was obtained for treatment procedures.   Mechanical debridement of nails 1-5  bilaterally performed with a nail nipper.  Filed with dremel without incident.     Return office visit   10 weeks                  Told patient to return for periodic foot care and evaluation due to potential at risk complications.   Derenda Giddings DPM  

## 2023-11-04 ENCOUNTER — Encounter: Payer: Self-pay | Admitting: Oncology

## 2023-11-11 ENCOUNTER — Inpatient Hospital Stay: Payer: Medicare PPO | Attending: Radiation Oncology | Admitting: Nurse Practitioner

## 2023-11-11 VITALS — BP 113/50 | HR 74 | Temp 98.6°F | Resp 19 | Wt 205.3 lb

## 2023-11-11 DIAGNOSIS — Z90710 Acquired absence of both cervix and uterus: Secondary | ICD-10-CM | POA: Insufficient documentation

## 2023-11-11 DIAGNOSIS — Z87891 Personal history of nicotine dependence: Secondary | ICD-10-CM | POA: Insufficient documentation

## 2023-11-11 DIAGNOSIS — M069 Rheumatoid arthritis, unspecified: Secondary | ICD-10-CM | POA: Insufficient documentation

## 2023-11-11 DIAGNOSIS — R918 Other nonspecific abnormal finding of lung field: Secondary | ICD-10-CM | POA: Insufficient documentation

## 2023-11-11 DIAGNOSIS — Z7952 Long term (current) use of systemic steroids: Secondary | ICD-10-CM | POA: Insufficient documentation

## 2023-11-11 DIAGNOSIS — Z8542 Personal history of malignant neoplasm of other parts of uterus: Secondary | ICD-10-CM | POA: Insufficient documentation

## 2023-11-11 DIAGNOSIS — Z08 Encounter for follow-up examination after completed treatment for malignant neoplasm: Secondary | ICD-10-CM

## 2023-11-11 DIAGNOSIS — G629 Polyneuropathy, unspecified: Secondary | ICD-10-CM | POA: Insufficient documentation

## 2023-11-11 NOTE — Progress Notes (Signed)
Gynecologic Oncology Interval Visit   Referring Provider: Dr. Logan Bores  Chief Complaint: recurrent stage IIIC-1 serous endometrial cancer  Subjective:  Sandra Fischer is a 72 y.o. G2P0 female (s/p laparotomy myomectomy for leiomyoma), initially seen in consultation from Dr. Logan Bores, diagnosed with stage IIIC-1 serous endometrial cancer s/p TLH/BSO, SLN with minilap, declined adjuvant chemo & rad, developed recurrence 10/20/2019 with adenopathy, s/p 6 cycles carbo-taxol chemotherapy with complete response, who returns to clinic today for surveillance.   She was seen by Dr. Cathie Hoops 08/17/23 and felt to be symptomatically stable. Last imaging on 04/29/23 was stable. She had covid and pneumonia 2 months ago and continues to recover. She denies pelvic pain, bleeding, or discharge. Bowel movements are normal. Denies other complaints.    CA 125 08/14/23 12.6 02/04/23  15.6 08/01/22  11.6 01/13/22 11.1 07/08/21  11.4 01/02/21  12.6 09/10/20 11.2 07/03/20  13.6 03/14/20 18.9 11/07/19  13     Gynecologic Oncology History:  She initially presented to Dr. Logan Bores as [phone] female with complaint of postmenopausal bleeding, intermittent, heavy at times.    Transabdominal ultrasound on 05/05/2019 demonstrated endometrium measuring 22mm. Uterus anteverted measuring 11.3 x 6.6 x 7.9 cm, heterogeous echo texture w/o evidence of focal masses. Within uterus multiple suspected fibroids measuring 7.2 x 4.8 x 6.6 cm and 2.6 x 1.7 x 3.1 cm. Ovaries not visualized. No adnexal masses. No free fluid in cul de sac.   Endometrial biopsy was performed on 05/20/2019 and demonstrated high-grade mixed endometrioid, predominantly, and serous carcinoma.  She complains of persistent bleeding and fatigue which she attributes to the bleeding. She presents today for management. She has significant medical issues including rheumatoid arthritis requiring chronic steroids (5-10 mg daily) for a long time (she does not know how long) and leflunomide (ARAVA).  She also has undergone myomectomy for leiomyoma and abdominoplasty.  She also has a history of cardiac disease. She Cardioversion for SVT on 10/17/2018. Her cardiologist Dr. Darrold Junker. The patient presented to [facility] on 10/17/2018 for chest pain and shortness of breath, noted to be in SVT, converted to sinus rhythm with adenosine, in the setting of colitis, anemia with hemoglobin 8.5, and untreated hypothyroidism with TSH 25, which has since returned to normal range.  2D echocardiogram revealed moderately reduced LV function with LVEF 35 to 40% with diffuse hypokinesis. The patient underwent Lexiscan Myoview on 01/19/2019, which revealed revealed LVEF 49%, a mild fixed inferior wall defect, scar versus artifact, with no evidence of ischemia. She was last seen on 04/26/2019 by Cardiology Leanora Ivanoff) with a plan to stay on her current medications and she was counseled about low sodium diet, DASH, and continuing hyperlipidemia medications.   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 She is married and provides care for her mother.   06/14/2019 CT C/A/P IMPRESSION: 1. Bulky fibroid uterus. There is no direct noncontrast CT evidence of endometrial malignancy. No evidence of lymphadenopathy or metastatic disease in the chest, abdomen, or pelvis. Please note that noncontrast CT is limited for the evaluation of solid organ metastases. 2. Stable 4 mm ground-glass pulmonary nodule of the left pulmonary apex (series 3, image 24). Although nonspecific, this is an unlikely isolated manifestation of metastatic disease. Attention on follow-up.  3. Chronic, incidental, and postoperative findings as detailed above.  She underwent TLH_BSO with Dr. Myrtie Neither on 06/21/2019.   Tumor size 9.3 cm, invading 99% of myometrium (3.55 of 3.6 cm), positive right external iliac sentinel lymph node. Negative washings. MSS/MMR - Intact/normal. HER2 -  negative at 28%/1+.   Case was discussed at [facility] Tumor Board on 07/04/2019. Possible  PORTEC3 regimen given subgroup analysis showing benefit in serous history vs clinical trial was discussed.   Based on pathology, adjuvant chemotherapy & radiation was recommended. She saw Dr. Cathie Hoops on 07/14/2019 and lengthy discussion regarding rationale of adjuvant treatment. She declined adjuvant treatment.  10/20/2019- CT Chest Abdomen Pelvis W Contrast multiple newly enlarged retroperitoneal and right iliac lymph nodes, the largest left retroperitoneal node measuring 1.9 x 1.6 cm (series 2, image 71), concerning for nodal metastatic disease. 3. Unchanged 4 mm ground-glass pulmonary nodule of the left pulmonary apex (series 3, image 24). This remains nonspecific although is again an unlikely manifestation of pulmonary metastatic disease.  Findings consistent with recurrent disease with adenopathy.   11/07/2019-02/20/20 She agreed to treatment and received 6 cycles of carbo-taxol chemotherapy  (dose reduced d/t neuropathy).   03/12/2020 CT Abdomen/pelvis The multiple enlarged retroperitoneal and right iliac lymph nodes identified as new on the previous exam from 10/20/2019 have resolved completely in the interval. 2. Tiny ground-glass pulmonary nodule medial left lung apex is stable in the interval. Likely benign, continued attention on follow-up recommended.  Data from GOG 258 was discussed including no differences in relapse free survival, fewer lower vaginal recurrences and fewer lower pelvic and para-aortic relapsed with the addition of RT. She was seen by Dr. Rushie Chestnut who did not recommend WPRT.   Post chemotherapy CT showed excellent response. Previously enlarged retroperitoneal and right iliac nodes have resolved. Adjuvant radiation was not recommended.   CT 02/14/21 chest IMPRESSION: 1. Left apical 4 mm ground-glass nodule is unchanged, considered benign. 2. A right lower lobe solid 3 mm nodule is also not significantly changed and can be presumed benign. 3.  No acute process or evidence of  metastatic disease in the chest. 4.  Aortic Atherosclerosis (ICD10-I70.0). 5. Mild hepatic steatosis. 6.  Tiny hiatal hernia.  CT 02/14/21 chest  10/29/22 CT C/A/P scan  IMPRESSION: 1. Status post hysterectomy. 2. No noncontrast evidence of recurrent or metastatic disease in the chest, abdomen, or pelvis. 3. Unchanged tiny, benign pulmonary nodules, for which no specific further follow-up or characterization is required. 4. Somewhat coarse contour of the liver, suggestive of cirrhosis. Correlate with biochemical findings. 5. Nonobstructive left nephrolithiasis.  04/17/23- ER for SOB and chest pain. Thought to be bronchitis. Treated with antibiotics.     Problem List: Patient Active Problem List   Diagnosis Date Noted   Lung nodule 02/09/2023   Age-related osteoporosis without current pathological fracture 04/22/2022   Diabetes mellitus without complication (HCC) 04/03/2021   Nuclear sclerotic cataract of right eye 04/03/2021   Severe persistent asthma 04/03/2021   Problem related to unspecified psychosocial circumstances 04/03/2021   Corn of foot 04/03/2021   Dependence on continuous positive airway pressure ventilation 04/03/2021   Dermatophytosis of nail 04/03/2021   Type 2 or unspecified type diabetes mellitus 04/03/2021   Disorder of bone and cartilage 04/03/2021   Dysphagia 04/03/2021   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   History of recurrent pneumonia 04/03/2021   Irritable colon 04/03/2021   Low back pain 04/03/2021   Other alveolar and parietoalveolar pneumonopathy 04/03/2021   Other and unspecified hyperlipidemia 04/03/2021   Other specified disease of nail 04/03/2021   Pain in joint, ankle and foot 04/03/2021   Polyp of colon 04/03/2021   Seborrheic dermatitis 04/03/2021   SVT (supraventricular tachycardia) (HCC) 04/03/2021   Vitiligo 04/03/2021   Vocal cord dysfunction 04/03/2021   Obesity  04/03/2021   Allergic asthma 04/03/2021   Obstructive sleep apnea  04/03/2021   Encounter for long-term (current) use of steroids 04/03/2021   Diabetic neuropathy (HCC) 08/02/2020   Iron deficiency anemia 07/03/2020   Vaginal discharge 05/30/2020   Encounter for antineoplastic chemotherapy 01/30/2020   Neuropathy due to chemotherapeutic drug (HCC) 01/30/2020   Anemia 01/30/2020   Gastroesophageal reflux disease 12/08/2019   Microcytic anemia 11/07/2019   Port-A-Cath in place 11/04/2019   Rheumatoid arthritis (HCC) 10/29/2019   Goals of care, counseling/discussion 07/20/2019   Endometrial cancer (HCC) 07/15/2019   Acute blood loss anemia 06/22/2019   Hypothyroidism, adult 06/15/2019   Myalgia and myositis 06/15/2019   Fibromyalgia    Cardiomyopathy (HCC) 12/06/2018   Atypical chest pain 12/06/2018   Perennial and seasonal allergic rhinitis 02/09/2018   Moderate persistent asthma 02/09/2018   Allergic conjunctivitis 02/09/2018   History of food allergy 02/09/2018   Allergic reaction 02/09/2018   RA (rheumatoid arthritis) (HCC) 08/22/2016   HTN (hypertension) 08/22/2016    Past Medical History: Past Medical History:  Diagnosis Date   Asthma    Asthma    Asthma exacerbation 08/22/2016   Cancer (HCC)    Collagen vascular disease (HCC)    Diabetes mellitus without complication (HCC)    History of Diabetes    Dyspnea    Environmental allergies    Fibromyalgia    High cholesterol    Hypertension    Hypothyroidism    Iron deficiency anemia 07/03/2020   Neuropathy    RA (rheumatoid arthritis) (HCC)    Sleep apnea    Thyroid disease     Past Surgical History: Past Surgical History:  Procedure Laterality Date   ABDOMINAL HYSTERECTOMY     CATARACT EXTRACTION     CHOLECYSTECTOMY     COLONOSCOPY WITH PROPOFOL N/A 03/14/2022   Procedure: COLONOSCOPY WITH PROPOFOL;  Surgeon: Regis Bill, MD;  Location: [facility] ENDOSCOPY;  Service: Endoscopy;  Laterality: N/A;  DM   CYSTOURETHROSCOPY     DIAGNOSTIC LAPAROSCOPY     EYE SURGERY      fibroids removed     PORTA CATH REMOVAL N/A 02/14/2021   Procedure: PORTA CATH REMOVAL;  Surgeon: Annice Needy, MD;  Location: [facility] INVASIVE CV LAB;  Service: Cardiovascular;  Laterality: N/A;   THYROIDECTOMY, PARTIAL     tummy tuck      Past Gynecologic History:  As per HPI  OB History:  OB History  Gravida Para Term Preterm AB Living  2       2    SAB IAB Ectopic Multiple Live Births  2            # Outcome Date GA Lbr Len/2nd Weight Sex Type Anes PTL Lv  2 SAB           1 SAB             Family History: Family History  Problem Relation Age of Onset   Diabetes Mother    Hypertension Mother    Hyperlipidemia Mother    Dementia Mother    Breast cancer Neg Hx    Ovarian cancer Neg Hx    Colon cancer Neg Hx     Social History: Social History   Socioeconomic History   Marital status: Married    Spouse name: Cedric    Number of children: Not on file   Years of education: Not on file   Highest education level: Not on file  Occupational History  Occupation: Retired  Tobacco Use   Smoking status: Former    Current packs/day: 0.00    Average packs/day: 0.1 packs/day for 0.2 years    Types: Cigarettes    Start date: 09/1978    Quit date: 1980    Years since quitting: 44.8   Smokeless tobacco: Never  Vaping Use   Vaping status: Never Used  Substance and Sexual Activity   Alcohol use: No   Drug use: No   Sexual activity: Yes    Birth control/protection: Post-menopausal  Other Topics Concern   Not on file  Social History Narrative   Not on file   Social Determinants of Health   Financial Resource Strain: Not on file  Food Insecurity: Not on file  Transportation Needs: Not on file  Physical Activity: Not on file  Stress: Not on file  Social Connections: Not on file  Intimate Partner Violence: Not on file   Immunization History  Administered Date(s) Administered   Fluad Quad(high Dose 65+) 11/04/2021   Influenza, High Dose Seasonal PF 10/03/2019    Influenza, Seasonal, Injecte, Preservative Fre 03/01/2013, 09/23/2013, 01/21/2016   Influenza,inj,Quad PF,6+ Mos 10/22/2016, 12/03/2017   Influenza-Unspecified 10/29/2000, 12/29/2000, 10/26/2008, 10/29/2008, 01/27/2012   PFIZER Comirnaty(Gray Top)Covid-19 Tri-Sucrose Vaccine 01/08/2021   PFIZER(Purple Top)SARS-COV-2 Vaccination 04/19/2020, 05/10/2020   Pneumococcal Conjugate-13 09/23/2013   Pneumococcal Polysaccharide-23 12/29/2000   Allergies: Allergies  Allergen Reactions   Abatacept Hives   Codeine Hives, Nausea And Vomiting and Nausea Only   Iodine Swelling   Latex Itching, Hives and Rash   Shellfish Allergy Swelling   Trimethoprim Hydrochloride [Trimethoprim] Other (See Comments)    Pancreatitis   Penicillins Nausea And Vomiting   Current Medications: Current Outpatient Medications  Medication Sig Dispense Refill   Acetaminophen (TYLENOL EXTRA STRENGTH PO) Take 1,300 mg by mouth 2 (two) times daily as needed.     ascorbic acid (VITAMIN C) 500 MG tablet Take 500 mg by mouth daily.     aspirin EC 81 MG tablet Take 81 mg by mouth daily.     azelastine (ASTELIN) 0.1 % nasal spray Place 2 sprays into both nostrils 2 (two) times daily. 30 mL 5   azithromycin (ZITHROMAX Z-PAK) 250 MG tablet Take 2 tablets (500 mg) on  Day 1,  followed by 1 tablet (250 mg) once daily on Days 2 through 5. (Patient not taking: Reported on 08/17/2023) 6 each 0   benzonatate (TESSALON PERLES) 100 MG capsule Take 1 capsule (100 mg total) by mouth 3 (three) times daily as needed for cough. (Patient not taking: Reported on 08/17/2023) 30 capsule 0   brompheniramine-pseudoephedrine-DM 30-2-10 MG/5ML syrup Take 10 mLs by mouth 4 (four) times daily as needed. (Patient not taking: Reported on 08/17/2023) 200 mL 0   Calcium Carbonate-Vit D-Min (CALCIUM 600+D PLUS MINERALS) 600-400 MG-UNIT TABS Take 1 tablet by mouth 2 (two) times daily.     cetirizine (ZYRTEC) 10 MG tablet Take 1 tablet (10 mg total) by mouth daily. 30  tablet 0   Cholecalciferol (D3 ADULT PO) Take 1 capsule by mouth daily.     clotrimazole-betamethasone (LOTRISONE) cream Apply topically 4 (four) times daily as needed.     desloratadine (CLARINEX) 5 MG tablet Take 5 mg by mouth daily.     docusate sodium (COLACE) 50 MG capsule Take 50 mg by mouth 2 (two) times daily.     empagliflozin (JARDIANCE) 25 MG TABS tablet Take 12.5 mg by mouth daily.     EPINEPHrine 0.3 mg/0.3 mL IJ  SOAJ injection Use as directed for severe allergic reaction. (Patient not taking: Reported on 02/09/2023) 2 Device 1   etanercept (ENBREL) 25 MG injection Inject 25 mg into the skin.     fluticasone (FLONASE) 50 MCG/ACT nasal spray Place 1 spray into both nostrils 2 (two) times daily. 16 g 0   fluticasone-salmeterol (ADVAIR) 250-50 MCG/ACT AEPB Inhale 1 puff into the lungs in the morning and at bedtime.     furosemide (LASIX) 10 MG/ML solution Take by mouth daily.     gabapentin (NEURONTIN) 300 MG capsule Take 1 capsule (300 mg total) by mouth 3 (three) times daily. 90 capsule 1   hydrALAZINE (APRESOLINE) 25 MG tablet Take 25 mg by mouth 2 (two) times daily.     hydrocortisone (ANUSOL-HC) 25 MG suppository Place 1 suppository (25 mg total) rectally every 12 (twelve) hours. 12 suppository 1   ipratropium (ATROVENT) 0.02 % nebulizer solution Take 0.5 mg by nebulization every 4 (four) hours as needed.      leflunomide (ARAVA) 20 MG tablet Take 20 mg by mouth daily.     leflunomide (ARAVA) 20 MG tablet Take 1 tablet by mouth daily.     levothyroxine (SYNTHROID) 75 MCG tablet Take 75 mcg by mouth daily before breakfast.     lisinopril (PRINIVIL,ZESTRIL) 40 MG tablet Take 20 mg by mouth in the morning and at bedtime.      metoprolol succinate (TOPROL-XL) 50 MG 24 hr tablet Take 50 mg by mouth daily. Take with or immediately following a meal.     metroNIDAZOLE (METROGEL VAGINAL) 0.75 % vaginal gel Insert 5 grams (37.5 mg of metronidazole) of gel into vagina once daily at bedtime for  five days 70 g 0   montelukast (SINGULAIR) 10 MG tablet Take 10 mg by mouth at bedtime.     nitroGLYCERIN (NITROSTAT) 0.4 MG SL tablet Place under the tongue. Place 1 tablet (0.4 mg total) under the tongue every 5 (five) minutes as needed for Chest pain May take up to 3 doses.     nortriptyline (PAMELOR) 25 MG capsule Take 25 mg by mouth at bedtime.     olopatadine (PATANOL) 0.1 % ophthalmic solution Place 1 drop into both eyes 2 (two) times daily. 5 mL 5   omeprazole (PRILOSEC) 10 MG capsule Take 20 mg by mouth daily.     pravastatin (PRAVACHOL) 40 MG tablet Take 40 mg by mouth daily.     predniSONE (DELTASONE) 50 MG tablet Take 1 tablet (50 mg total) by mouth daily with breakfast. (Patient not taking: Reported on 08/17/2023) 5 tablet 0   Prenatal Vit-Fe Fumarate-FA (MULTIVITAMIN-PRENATAL) 27-0.8 MG TABS tablet Take 1 tablet by mouth daily.      senna-docusate (SENOKOT-S) 8.6-50 MG tablet Take 1 tablet by mouth at bedtime as needed for mild constipation.     sulfaSALAzine (AZULFIDINE) 500 MG tablet TAKE ONE TABLET BY MOUTH TWO TIMES A DAY FOR 14 DAYS, THEN TAKE TWO TABLETS TWO TIMES A DAY FOR 14 DAYS, THEN TAKE THREE TABLETS TWO TIMES A DAY FOR 62 DAYS FOR INFLAMMATION. TAKE AFTER MEALS     tiotropium (SPIRIVA) 18 MCG inhalation capsule Place 18 mcg into inhaler and inhale daily.     verapamil (CALAN) 120 MG tablet Take 240 mg by mouth 2 (two) times daily.     No current facility-administered medications for this visit.    Review of Systems General:  no complaints Skin: no complaints Eyes: no complaints HEENT: no complaints Breasts: no complaints Pulmonary: no  complaints Cardiac: no complaints Gastrointestinal: no complaints Genitourinary/Sexual: no complaints Ob/Gyn: no complaints Musculoskeletal: no complaints Hematology: no complaints Neurologic/Psych: no complaints   Objective:  Physical Examination:  BP (!) 113/50   Pulse 74   Temp 98.6 F (37 C)   Resp 19   Wt 205 lb 4.8  oz (93.1 kg)   SpO2 96%   BMI 37.55 kg/m     GENERAL: Patient is a well appearing female in no acute distress HEENT:  Sclera clear. Anicteric NODES:  Negative axillary, supraclavicular, inguinal lymph node survery LUNGS:  Clear to auscultation bilaterally HEART:  Regular rate and rhythm ABDOMEN:  Soft, nontender.  No hernias, incisions well healed. No masses or ascites EXTREMITIES:  No peripheral edema. Atraumatic. No cyanosis SKIN:  Clear with no obvious rashes or skin changes NEURO:  Nonfocal. Well oriented.  Appropriate affect  Pelvic: chaperoned by CMA EGBUS: no lesions Vagina: no lesions, no discharge or bleeding Cervix: surgically absent Uterus: surgically absent BME: no palpable masses Rectovaginal: deferred  Labs: No labs on site today  Radiologic Imaging:  Per HPI    Assessment:  Sandra Fischer is a 72 y.o. 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  Tumor size 9.3 cm, invading 99% of myometrium (3.55 of 3.6cm), positive right external iliac sentinel lymph nodes. Negative washings. Recurrent disease 10/20/2019 with adenopathy up to 1.9 x 1.6 cm with CR to chemotherapy with paclitaxel/carboplatin completed 01/2020.  Normal exam today.   CA125 normal in 2/24, but was not elevated at recurrence. Last CT scan 10/2022 reassuring and no definitive evidence of disease. Small pulmonary nodules. 04/29/23 CT again stable without specific findings of recurrent disease. Clinically asymptomatic. NED on exam.   Grade 2 peripheral neuropathy.    Patient had COVID-19 in 10/22 and again in 2024.   Medical co-morbidities complicating care: She has significant medical issues including rheumatoid arthritis requiring chronic steroids (5-10 mg daily) for a long time (she does not know how long) and leflunomide (ARAVA); HTN; Dilated  cardiomyopathy; hyperlipidemia; multiple intra-abdominal surgeries (myomectomy for leiomyoma, cholecystectomy, and abdominoplasty) ; obesity There is no height or weight on file to calculate BMI.  Plan:   Problem List Items Addressed This Visit   None  Continue surveillance alternating visits with Dr. Ignacia Palma oncology    Will try to move up CT scan C/A/P in view of her symptoms and high risk of recurrence.   Continue follow up with Dr. Cathie Hoops in 3 months and Gyn Onc in 6 months.     Pulmonary nodule unchanged and felt to be benign. No additional surveillance recommended.

## 2023-11-18 ENCOUNTER — Emergency Department: Payer: No Typology Code available for payment source

## 2023-11-18 ENCOUNTER — Encounter: Payer: Self-pay | Admitting: Emergency Medicine

## 2023-11-18 ENCOUNTER — Other Ambulatory Visit: Payer: Self-pay

## 2023-11-18 ENCOUNTER — Emergency Department
Admission: EM | Admit: 2023-11-18 | Discharge: 2023-11-18 | Disposition: A | Discharge status: Home / Self Care | Payer: No Typology Code available for payment source | Attending: Emergency Medicine | Admitting: Emergency Medicine

## 2023-11-18 DIAGNOSIS — I10 Essential (primary) hypertension: Secondary | ICD-10-CM | POA: Diagnosis not present

## 2023-11-18 DIAGNOSIS — E039 Hypothyroidism, unspecified: Secondary | ICD-10-CM | POA: Diagnosis not present

## 2023-11-18 DIAGNOSIS — R0789 Other chest pain: Secondary | ICD-10-CM | POA: Diagnosis not present

## 2023-11-18 DIAGNOSIS — J40 Bronchitis, not specified as acute or chronic: Secondary | ICD-10-CM | POA: Diagnosis not present

## 2023-11-18 DIAGNOSIS — R079 Chest pain, unspecified: Secondary | ICD-10-CM | POA: Diagnosis present

## 2023-11-18 DIAGNOSIS — J45909 Unspecified asthma, uncomplicated: Secondary | ICD-10-CM | POA: Diagnosis not present

## 2023-11-18 DIAGNOSIS — E119 Type 2 diabetes mellitus without complications: Secondary | ICD-10-CM | POA: Diagnosis not present

## 2023-11-18 LAB — CBC
HCT: 42.8 % (ref 36.0–46.0)
Hemoglobin: 13.5 g/dL (ref 12.0–15.0)
MCH: 27.7 pg (ref 26.0–34.0)
MCHC: 31.5 g/dL (ref 30.0–36.0)
MCV: 87.7 fL (ref 80.0–100.0)
Platelets: 277 10*3/uL (ref 150–400)
RBC: 4.88 MIL/uL (ref 3.87–5.11)
RDW: 14.9 % (ref 11.5–15.5)
WBC: 8.5 10*3/uL (ref 4.0–10.5)
nRBC: 0 % (ref 0.0–0.2)

## 2023-11-18 LAB — BASIC METABOLIC PANEL
Anion gap: 8 (ref 5–15)
BUN: 15 mg/dL (ref 8–23)
CO2: 23 mmol/L (ref 22–32)
Calcium: 9.7 mg/dL (ref 8.9–10.3)
Chloride: 107 mmol/L (ref 98–111)
Creatinine, Ser: 0.8 mg/dL (ref 0.44–1.00)
GFR, Estimated: >60 mL/min (ref >60)
Glucose, Bld: 105 mg/dL — ABNORMAL HIGH (ref 70–99)
Potassium: 3.7 mmol/L (ref 3.5–5.1)
Sodium: 138 mmol/L (ref 135–145)

## 2023-11-18 LAB — TROPONIN I (HIGH SENSITIVITY)
Troponin I (High Sensitivity): 3 ng/L (ref <18)
Troponin I (High Sensitivity): 3 ng/L (ref <18)

## 2023-11-18 MED ORDER — IPRATROPIUM-ALBUTEROL 0.5-2.5 (3) MG/3ML IN SOLN
3.0000 mL | Freq: Once | RESPIRATORY_TRACT | Status: AC
  Administered 2023-11-18: 3 mL via RESPIRATORY_TRACT
  Filled 2023-11-18: qty 3

## 2023-11-18 MED ORDER — AMOXICILLIN-POT CLAVULANATE 875-125 MG PO TABS: 1.0000 | ORAL_TABLET | Freq: Two times a day (BID) | ORAL | 0 refills | Status: AC

## 2023-11-18 MED ORDER — ONDANSETRON 4 MG PO TBDP: 4.0000 mg | ORAL_TABLET | Freq: Three times a day (TID) | ORAL | 0 refills | Status: AC | PRN

## 2023-11-18 MED ORDER — AZITHROMYCIN 250 MG PO TABS: ORAL_TABLET | ORAL | 0 refills | Status: DC

## 2023-11-18 NOTE — Discharge Instructions (Addendum)
Your exam, EKG, x-ray, and labs are normal and reassuring at this time.  No signs of a serious cardiac cause of your chest pain.  Symptoms likely due to post viral/pneumonia bronchitis.  Take prescription meds as directed.  Consider OTC Delsym (dextromethorphan) syrup for additional cough relief.  Follow-up with your primary provider or return to ED if necessary.

## 2023-11-18 NOTE — ED Notes (Signed)
Patient has a history of rheumatoid arthritis, and constantly has constant pain.

## 2023-11-18 NOTE — ED Notes (Signed)
Pt d/c home per EDP order. Discharge summary reviewed, verbalize understanding. Discharge home with spouse. NAD.

## 2023-11-18 NOTE — ED Triage Notes (Signed)
Patient to ED via POV for centralized to left sided CP. Started a couple days ago. Hx of a fib. States she took 1 nitroglycerin approx 15 PTA.

## 2023-11-18 NOTE — ED Provider Notes (Signed)
[facility] Emergency Department Provider Note     Event Date/Time   First MD Initiated Contact with Patient 11/18/23 1540     (approximate)   History   Chest Pain   HPI  Sandra Fischer is a 72 y.o. female with a history of asthma, RA, HTN, cardiomyopathy, DM,, and hypothyroidism presents to the ED for evaluation of left-sided chest pain.  Patient would endorse onset of symptoms a few days ago.  She gives a history of A-fib but denies any anticoagulation therapy.  She does have nitro for as needed use, and dosed a tablet about 50 minutes prior to arrival.  She arrives via POV for evaluation of her symptoms.   Physical Exam   Triage Vital Signs: ED Triage Vitals  Encounter Vitals Group     BP 11/18/23 1156 (!) 141/77     Systolic BP Percentile --      Diastolic BP Percentile --      Pulse Rate 11/18/23 1156 70     Resp 11/18/23 1156 18     Temp 11/18/23 1156 98.7 F (37.1 C)     Temp Source 11/18/23 1156 Oral     SpO2 11/18/23 1156 96 %     Weight 11/18/23 1153 195 lb (88.5 kg)     Height 11/18/23 1153 5\' 2"  (1.575 m)     Head Circumference --      Peak Flow --      Pain Score 11/18/23 1152 7     Pain Loc --      Pain Education --      Exclude from Growth Chart --     Most recent vital signs: Vitals:   11/18/23 1156 11/18/23 1504  BP: (!) 141/77 (!) 148/84  Pulse: 70 75  Resp: 18 18  Temp: 98.7 F (37.1 C)   SpO2: 96% 94%    General Awake, no distress. NAD HEENT NCAT. PERRL. EOMI. No rhinorrhea. Mucous membranes are moist. CV:  Good peripheral perfusion. RRR RESP:  Normal effort. CTA ABD:  No distention.    ED Results / Procedures / Treatments   Labs (all labs ordered are listed, but only abnormal results are displayed) Labs Reviewed  BASIC METABOLIC PANEL - Abnormal; Notable for the following components:      Result Value   Glucose, Bld 105 (*)    All other components within normal limits  CBC  TROPONIN I (HIGH  SENSITIVITY)  TROPONIN I (HIGH SENSITIVITY)    EKG  Vent. rate 70 BPM PR interval * ms QRS duration 66 ms QT/QTcB 374/403 ms P-R-T axes * 49 8 Accelerated Junctional rhythm Low voltage QRS Septal infarct (cited on or before 06-Dec-2021) Abnormal ECG When compared with ECG of 17-Apr-2023 18:55, Junctional rhythm has replaced Sinus rhythm Nonspecific T wave abnormality now evident in Inferior leads Nonspecific T wave abnormality now evident in Lateral leads Confirmed by UNCONFIRMED, DOCTOR ([id number]), editor Egbert Garibaldi,  RADIOLOGY  I personally viewed and evaluated these images as part of my medical decision making, as well as reviewing the written report by the radiologist.  ED Provider Interpretation: No acute findings  DG Chest 2 View  Result Date: 11/18/2023 CLINICAL DATA:  Left-sided chest pain EXAM: CHEST - 2 VIEW COMPARISON:  X-ray 06/07/2023. FINDINGS: The heart size and mediastinal contours are within normal limits. No consolidation, pneumothorax or effusion. No edema. The visualized skeletal structures are unremarkable. Surgical clips in the upper abdomen. IMPRESSION: No acute cardiopulmonary disease. Electronically Signed  By: Karen Kays M.D.   On: 11/18/2023 15:53     PROCEDURES:  Critical Care performed: No  Procedures   MEDICATIONS ORDERED IN ED: Medications  ipratropium-albuterol (DUONEB) 0.5-2.5 (3) MG/3ML nebulizer solution 3 mL (3 mLs Nebulization Given 11/18/23 1512)     IMPRESSION / MDM / ASSESSMENT AND PLAN / ED COURSE  I reviewed the triage vital signs and the nursing notes.                              Differential diagnosis includes, but is not limited to, ACS, aortic dissection, pulmonary embolism, cardiac tamponade, pneumothorax, pneumonia, pericarditis, myocarditis, GI-related causes including esophagitis/gastritis, and musculoskeletal chest wall pain.    Patient's presentation is most consistent with acute complicated illness / injury requiring  diagnostic workup.  Patient's diagnosis is consistent with nonspecific chest pain and likely bronchitis.  Patient with reassuring exam and workup at this time without evidence of acute respiratory distress.  Symptoms may represent a postviral pneumonia or bronchitis.  No acute lab abnormalities.  X-ray without evidence of consolidation based on my interpretation.  Remaining labs including troponin x 2 normal and reassuring, do not indicate an ACS or STEMI.  No malignant arrhythmia on EKG.  Patient will be discharged home with prescriptions for Augmentin, azithromycin, and Zofran. Patient is to follow up with PCP as discussed as needed or otherwise directed. Patient is given ED precautions to return to the ED for any worsening or new symptoms.   FINAL CLINICAL IMPRESSION(S) / ED DIAGNOSES   Final diagnoses:  Nonspecific chest pain  Bronchitis     Rx / DC Orders   ED Discharge Orders          Ordered    azithromycin (ZITHROMAX Z-PAK) 250 MG tablet        11/18/23 1621    amoxicillin-clavulanate (AUGMENTIN) 875-125 MG tablet  2 times daily        11/18/23 1621    ondansetron (ZOFRAN-ODT) 4 MG disintegrating tablet  Every 8 hours PRN        11/18/23 1621             Note:  This document was prepared using Dragon voice recognition software and may include unintentional dictation errors.    Lissa Hoard, PA-C 11/21/23 4098    Trinna Post, MD 11/24/23 [phone]

## 2023-11-19 ENCOUNTER — Encounter: Payer: Self-pay | Admitting: Oncology

## 2023-12-08 ENCOUNTER — Encounter: Payer: Self-pay | Admitting: Oncology

## 2024-01-07 ENCOUNTER — Ambulatory Visit: Payer: Medicare Other | Admitting: Podiatry

## 2024-01-11 ENCOUNTER — Encounter: Payer: Self-pay | Admitting: Podiatry

## 2024-01-11 ENCOUNTER — Ambulatory Visit (INDEPENDENT_AMBULATORY_CARE_PROVIDER_SITE_OTHER): Payer: Medicare Other | Admitting: Podiatry

## 2024-01-11 DIAGNOSIS — B351 Tinea unguium: Secondary | ICD-10-CM | POA: Diagnosis not present

## 2024-01-11 DIAGNOSIS — M79674 Pain in right toe(s): Secondary | ICD-10-CM | POA: Diagnosis not present

## 2024-01-11 DIAGNOSIS — E1142 Type 2 diabetes mellitus with diabetic polyneuropathy: Secondary | ICD-10-CM

## 2024-01-11 DIAGNOSIS — M79675 Pain in left toe(s): Secondary | ICD-10-CM | POA: Diagnosis not present

## 2024-01-11 NOTE — Progress Notes (Signed)
This patient returns to my office for at risk foot care.  This patient requires this care by a professional since this patient will be at risk due to having diet controlled diabetes.   This patient is unable to cut nails herself since the patient cannot reach her nails.These nails are painful walking and wearing shoes.  This patient presents for at risk foot care today.  General Appearance  Alert, conversant and in no acute stress.  Vascular  Dorsalis pedis and posterior tibial  pulses are palpable  bilaterally.  Capillary return is within normal limits  bilaterally. Temperature is within normal limits  bilaterally.  Neurologic  Senn-Weinstein monofilament wire test WNL  bilaterally. Muscle power within normal limits bilaterally.  Nails Thick disfigured discolored nails with subungual debris  from hallux to fifth toes bilaterally. No evidence of bacterial infection or drainage bilaterally.  Orthopedic  No limitations of motion  feet .  No crepitus or effusions noted.  No bony pathology or digital deformities noted.  HAV  B/L.  Skin  normotropic skin with no porokeratosis noted bilaterally.  No signs of infections or ulcers noted.     Onychomycosis  Pain in right toes  Pain in left toes  Diabetes    Consent was obtained for treatment procedures.   Mechanical debridement of nails 1-5  bilaterally performed with a nail nipper.  Filed with dremel without incident.     Return office visit   10 weeks                  Told patient to return for periodic foot care and evaluation due to potential at risk complications.   Derenda Giddings DPM  

## 2024-02-19 ENCOUNTER — Inpatient Hospital Stay: Payer: Medicare PPO | Attending: Radiation Oncology

## 2024-02-22 ENCOUNTER — Inpatient Hospital Stay: Payer: Medicare PPO | Admitting: Oncology

## 2024-03-03 ENCOUNTER — Telehealth: Payer: Self-pay | Admitting: Podiatry

## 2024-03-03 NOTE — Telephone Encounter (Signed)
 Pt calling regarding balance of $41.09 and $35.00. Can you verify that it ran thru [facility] and [address]

## 2024-03-07 ENCOUNTER — Inpatient Hospital Stay: Payer: Medicare PPO | Attending: Oncology

## 2024-03-07 DIAGNOSIS — C541 Malignant neoplasm of endometrium: Secondary | ICD-10-CM

## 2024-03-07 DIAGNOSIS — Z8542 Personal history of malignant neoplasm of other parts of uterus: Secondary | ICD-10-CM | POA: Diagnosis present

## 2024-03-07 LAB — CBC WITH DIFFERENTIAL (CANCER CENTER ONLY)
Abs Immature Granulocytes: 0.09 10*3/uL — ABNORMAL HIGH (ref 0.00–0.07)
Basophils Absolute: 0.1 10*3/uL (ref 0.0–0.1)
Basophils Relative: 1 %
Eosinophils Absolute: 0.2 10*3/uL (ref 0.0–0.5)
Eosinophils Relative: 1 %
HCT: 38.6 % (ref 36.0–46.0)
Hemoglobin: 11.6 g/dL — ABNORMAL LOW (ref 12.0–15.0)
Immature Granulocytes: 1 %
Lymphocytes Relative: 25 %
Lymphs Abs: 2.8 10*3/uL (ref 0.7–4.0)
MCH: 25.7 pg — ABNORMAL LOW (ref 26.0–34.0)
MCHC: 30.1 g/dL (ref 30.0–36.0)
MCV: 85.6 fL (ref 80.0–100.0)
Monocytes Absolute: 1.2 10*3/uL — ABNORMAL HIGH (ref 0.1–1.0)
Monocytes Relative: 11 %
Neutro Abs: 6.8 10*3/uL (ref 1.7–7.7)
Neutrophils Relative %: 61 %
Platelet Count: 278 10*3/uL (ref 150–400)
RBC: 4.51 MIL/uL (ref 3.87–5.11)
RDW: 17.2 % — ABNORMAL HIGH (ref 11.5–15.5)
WBC Count: 11.1 10*3/uL — ABNORMAL HIGH (ref 4.0–10.5)
nRBC: 0 % (ref 0.0–0.2)

## 2024-03-07 LAB — CMP (CANCER CENTER ONLY)
ALT: 14 U/L (ref 0–44)
AST: 15 U/L (ref 15–41)
Albumin: 3.5 g/dL (ref 3.5–5.0)
Alkaline Phosphatase: 61 U/L (ref 38–126)
Anion gap: 8 (ref 5–15)
BUN: 16 mg/dL (ref 8–23)
CO2: 26 mmol/L (ref 22–32)
Calcium: 9 mg/dL (ref 8.9–10.3)
Chloride: 104 mmol/L (ref 98–111)
Creatinine: 0.87 mg/dL (ref 0.44–1.00)
GFR, Estimated: >60 mL/min (ref >60)
Glucose, Bld: 133 mg/dL — ABNORMAL HIGH (ref 70–99)
Potassium: 3.2 mmol/L — ABNORMAL LOW (ref 3.5–5.1)
Sodium: 138 mmol/L (ref 135–145)
Total Bilirubin: 0.6 mg/dL (ref 0.0–1.2)
Total Protein: 7.5 g/dL (ref 6.5–8.1)

## 2024-03-08 ENCOUNTER — Telehealth: Payer: Self-pay | Admitting: Oncology

## 2024-03-08 LAB — CA 125: Cancer Antigen (CA) 125: 10.8 U/mL (ref 0.0–38.1)

## 2024-03-08 NOTE — Telephone Encounter (Signed)
 Patient called to reschedule due to having Upper Respiratory infection. Patients appointment rescheduled as requested.

## 2024-03-09 ENCOUNTER — Inpatient Hospital Stay: Payer: Medicare PPO | Admitting: Oncology

## 2024-03-23 ENCOUNTER — Encounter: Payer: Self-pay | Admitting: Podiatry

## 2024-03-23 ENCOUNTER — Encounter: Payer: Self-pay | Admitting: Oncology

## 2024-03-23 ENCOUNTER — Ambulatory Visit: Payer: Medicare PPO

## 2024-03-23 ENCOUNTER — Ambulatory Visit (INDEPENDENT_AMBULATORY_CARE_PROVIDER_SITE_OTHER): Payer: Medicare PPO | Admitting: Podiatry

## 2024-03-23 DIAGNOSIS — M79674 Pain in right toe(s): Secondary | ICD-10-CM

## 2024-03-23 DIAGNOSIS — G620 Drug-induced polyneuropathy: Secondary | ICD-10-CM | POA: Diagnosis not present

## 2024-03-23 DIAGNOSIS — E1142 Type 2 diabetes mellitus with diabetic polyneuropathy: Secondary | ICD-10-CM | POA: Diagnosis not present

## 2024-03-23 DIAGNOSIS — B351 Tinea unguium: Secondary | ICD-10-CM

## 2024-03-23 DIAGNOSIS — T451X5A Adverse effect of antineoplastic and immunosuppressive drugs, initial encounter: Secondary | ICD-10-CM

## 2024-03-23 DIAGNOSIS — M79675 Pain in left toe(s): Secondary | ICD-10-CM | POA: Diagnosis not present

## 2024-03-23 NOTE — Progress Notes (Signed)
This patient returns to my office for at risk foot care.  This patient requires this care by a professional since this patient will be at risk due to having diet controlled diabetes.   This patient is unable to cut nails herself since the patient cannot reach her nails.These nails are painful walking and wearing shoes.  This patient presents for at risk foot care today.  General Appearance  Alert, conversant and in no acute stress.  Vascular  Dorsalis pedis and posterior tibial  pulses are palpable  bilaterally.  Capillary return is within normal limits  bilaterally. Temperature is within normal limits  bilaterally.  Neurologic  Senn-Weinstein monofilament wire test WNL  bilaterally. Muscle power within normal limits bilaterally.  Nails Thick disfigured discolored nails with subungual debris  from hallux to fifth toes bilaterally. No evidence of bacterial infection or drainage bilaterally.  Orthopedic  No limitations of motion  feet .  No crepitus or effusions noted.  No bony pathology or digital deformities noted.  HAV  B/L.  Skin  normotropic skin with no porokeratosis noted bilaterally.  No signs of infections or ulcers noted.     Onychomycosis  Pain in right toes  Pain in left toes  Diabetes    Consent was obtained for treatment procedures.   Mechanical debridement of nails 1-5  bilaterally performed with a nail nipper.  Filed with dremel without incident.     Return office visit   10 weeks                  Told patient to return for periodic foot care and evaluation due to potential at risk complications.   Derenda Giddings DPM  

## 2024-03-31 ENCOUNTER — Encounter: Payer: Self-pay | Admitting: Oncology

## 2024-03-31 ENCOUNTER — Inpatient Hospital Stay: Attending: Oncology | Admitting: Oncology

## 2024-03-31 VITALS — BP 169/81 | HR 66 | Temp 98.6°F | Resp 18 | Wt 203.0 lb

## 2024-03-31 DIAGNOSIS — D649 Anemia, unspecified: Secondary | ICD-10-CM | POA: Diagnosis not present

## 2024-03-31 DIAGNOSIS — G620 Drug-induced polyneuropathy: Secondary | ICD-10-CM | POA: Insufficient documentation

## 2024-03-31 DIAGNOSIS — C541 Malignant neoplasm of endometrium: Secondary | ICD-10-CM | POA: Diagnosis present

## 2024-03-31 DIAGNOSIS — M069 Rheumatoid arthritis, unspecified: Secondary | ICD-10-CM | POA: Diagnosis not present

## 2024-03-31 DIAGNOSIS — T451X5A Adverse effect of antineoplastic and immunosuppressive drugs, initial encounter: Secondary | ICD-10-CM

## 2024-03-31 DIAGNOSIS — R911 Solitary pulmonary nodule: Secondary | ICD-10-CM

## 2024-03-31 DIAGNOSIS — Z79899 Other long term (current) drug therapy: Secondary | ICD-10-CM | POA: Diagnosis not present

## 2024-03-31 DIAGNOSIS — Z87891 Personal history of nicotine dependence: Secondary | ICD-10-CM | POA: Diagnosis not present

## 2024-03-31 MED ORDER — POTASSIUM CHLORIDE CRYS ER 20 MEQ PO TBCR: 20.0000 meq | EXTENDED_RELEASE_TABLET | Freq: Every day | ORAL | 0 refills | Status: DC

## 2024-03-31 NOTE — Assessment & Plan Note (Addendum)
 Recurrent endometrial cancer Status post 6 cycles of chemotherapy with carboplatin and Taxol. She will be 5 years after surgery in June 2025   Labs are reviewed and discussed with patient. CA125 has been stable. Obtain annual CT scan. -   Continue alternate follow-up with GYN oncology and Oncology every 6 months.

## 2024-03-31 NOTE — Progress Notes (Signed)
 Hematology/Oncology Progress note [phone] [phone]    CHIEF COMPLAINTS/REASON FOR VISIT:  Follow up for serous endometrial cancer  ASSESSMENT & PLAN:   Endometrial cancer (HCC) Recurrent endometrial cancer Status post 6 cycles of chemotherapy with carboplatin and Taxol. She will be 5 years after surgery in June 2025   Labs are reviewed and discussed with patient. CA125 has been stable. Obtain annual CT scan. -   Continue alternate follow-up with GYN oncology and Oncology every 6 months.  Lung nodule Continue CT surveillance.   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   Stable to improved.  She has self discontinued gabapentin, manageable symptoms.   Rheumatoid arthritis (HCC) follow-up rheumatologist. - off Prednisone, now on Enbrel.  Anemia Will check Folate, B12, iron tibc ferritin at next visit.    Orders Placed This Encounter  Procedures   CT CHEST ABDOMEN PELVIS W CONTRAST    Standing Status:   Future    Expected Date:   04/30/2024    Expiration Date:   03/31/2025    If indicated for the ordered procedure, I authorize the administration of contrast media per Radiology protocol:   Yes    Does the patient have a contrast media/X-ray dye allergy?:   No    Preferred imaging location?:   [facility]    If indicated for the ordered procedure, I authorize the administration of oral contrast media per Radiology protocol:   Yes   CBC with Differential (Cancer Center Only)    Standing Status:   Future    Expected Date:   09/30/2024    Expiration Date:   03/31/2025   CMP (Cancer Center only)    Standing Status:   Future    Expected Date:   09/30/2024    Expiration Date:   03/31/2025   CA 125    Standing Status:   Future    Expected Date:   09/30/2024    Expiration Date:   03/31/2025   Iron and TIBC    Standing Status:   Future    Expected Date:   09/30/2024    Expiration Date:   03/31/2025   Ferritin     Standing Status:   Future    Expected Date:   09/30/2024    Expiration Date:   03/31/2025   Retic Panel    Standing Status:   Future    Expected Date:   09/30/2024    Expiration Date:   03/31/2025   Vitamin B12    Standing Status:   Future    Expected Date:   09/30/2024    Expiration Date:   03/31/2025   Folate    Standing Status:   Future    Expected Date:   09/30/2024    Expiration Date:   03/31/2025    All questions were answered. The patient knows to call the clinic with any problems, questions or concerns.  [name], MD, PhD [facility] Hematology Oncology 03/31/2024   HISTORY OF PRESENTING ILLNESS:   Sandra Fischer is a  73 y.o.  female with PMH listed below was seen in consultation at the request of  [facility]  for evaluation of endometrial cancer Patient had TH/BSO sentinel lymph node biopsy by [facility] GYN oncology Dr. Myrtie Neither on 06/21/2019. Extensive medical records from [facility] review was performed by me.  Tumor size 9.3 cm, invading 99% of myometrium [3.55 out of 3.6 cm], positive right external iliac sentinel lymph nodes.  Negative washing.  No LVI, serosa is uninvolved. 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 pT1bpN40mi  FIGO stage IIIC1 MSI intact HER 2 negative.   Patient was seen by Dr. Johnnette Litter on 07/06/2019 due to vaginal odor. Patient presents for establishing care and discussion for adjuvant chemotherapy radiation.  Per Dr. Annye Rusk note, her case was discussed at [facility] tumor board on 07/04/2019.  Portex 3 regimen was recommended given subgroup analysis showing benefit in the serous histology versus clinical trial. HER2 was ordered and pending results.   Patient has a history of SVT.  10/14/2018 2D echo reviewed moderately reduced LV function with LVEF 35 to 40% with diffuse hypokinesis.  Patient had Lexiscan Myoview on 01/19/2019.  LVEF 49%.  SPECT images revealed a mild fixed inferior wall defect, scar  versus artifact.  No evidence of ischemia.  She follows up with cardiology Dr.Paraschos 04/26/2019 with a plan to stay on her current medication. 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 She is married and providing care for her mother.  # 10/20/2019 CT chest abdomen pelvis with contrast showed disease recurrence.  There are multiple newly enlarged retroperitoneal and right iliac lymph nodes the largest the left retroperitoneal node measuring 1.9 x 1.6 cm concerning for nodal metastasis disease.  Unchanged 4 mm groundglass pulmonary nodule of the left pulmonary apex.  This remains nonspecific. #Cardiomyopathy, LVEF35 to 40% Last seen by cardiology 04/26/2019.  Recommend patient to continue follow-up with cardiology.  # GERD  ER visit on 11/13/2019 due to chest pain which started after he ate at a restaurant and developed burning sensation in her chest.  No associated dyspnea. Patient had a CTA which showed no pulmonary embolism.  Patient symptoms got better after GI cocktail.  Was considered to be secondary to acid reflux.  Patient takes omeprazole 20 mg daily.   # 11/03/2019 -02/20/2020 S/p 6 cycles of chemotherapy with carboplatin and Taxol. Postchemotherapy CT scan showed excellent treatment response.   I discussed with Dr.Secord, according to data from GOG 258,  there were no differences in relapse-free survival fewer lower vaginal recurrences and fewer lower pelvic and para-aortic relapses with the addition of RT  11/11/2021, CT chest abdomen pelvis showed stable examination without new/progressive diseases.  Stable benign small bilateral pulmonary nodules.  Left-sided colonic diverticulosis without findings of acute radiculitis.  Aortic atherosclerosis.  04/05/2022, CT angiogram chest PE program showed no PE, peripheral bronchi secondary suggesting of acute bronchitis.  No evidence of consolidating pneumonia or pulmonary edema.  May 2024 CT chest abdomen pelvis w contrast- no  recurrence  INTERVAL HISTORY [name] is a 73 y.o. female who has above history reviewed by me today presents for follow up visit for management of endometrial cancer. Patient has neuropathy, stable symptoms, off gabapentin.  Rheumatoid arthritis, follows up with rheumatology, off Prednisone, now on Enbrel.   Review of Systems  Constitutional:  Positive for fatigue. Negative for appetite change, chills and fever.  HENT:   Negative for hearing loss and voice change.   Eyes:  Negative for eye problems.  Respiratory:  Negative for chest tightness and cough.   Cardiovascular:  Negative for chest pain.  Gastrointestinal:  Negative for abdominal distention, abdominal pain and blood in stool.  Endocrine: Negative for hot flashes.  Genitourinary:  Negative for difficulty urinating and frequency.   Musculoskeletal:  Negative for arthralgias.  Skin:  Negative for itching and rash.  Neurological:  Positive for numbness. Negative for extremity weakness.  Hematological:  Negative for adenopathy.  Psychiatric/Behavioral:  Negative for confusion.     MEDICAL HISTORY:  Past Medical History:  Diagnosis Date   Asthma    Asthma    Asthma exacerbation 08/22/2016   Cancer (HCC)    Collagen vascular disease (HCC)    Diabetes mellitus without complication (HCC)    History of Diabetes    Dyspnea    Environmental allergies    Fibromyalgia    High cholesterol    Hypertension    Hypothyroidism    Iron deficiency anemia 07/03/2020   Neuropathy    RA (rheumatoid arthritis) (HCC)    Sleep apnea    Thyroid disease     SURGICAL HISTORY: Past Surgical History:  Procedure Laterality Date   ABDOMINAL HYSTERECTOMY     CATARACT EXTRACTION     CHOLECYSTECTOMY     COLONOSCOPY WITH PROPOFOL N/A 03/14/2022   Procedure: COLONOSCOPY WITH PROPOFOL;  Surgeon: Regis Bill, MD;  Location: [facility] ENDOSCOPY;  Service: Endoscopy;  Laterality: N/A;  DM   CYSTOURETHROSCOPY     DIAGNOSTIC LAPAROSCOPY      EYE SURGERY     fibroids removed     PORTA CATH REMOVAL N/A 02/14/2021   Procedure: PORTA CATH REMOVAL;  Surgeon: Annice Needy, MD;  Location: [facility] INVASIVE CV LAB;  Service: Cardiovascular;  Laterality: N/A;   THYROIDECTOMY, PARTIAL     tummy tuck      SOCIAL HISTORY: Social History   Socioeconomic History   Marital status: Married    Spouse name: Cedric    Number of children: Not on file   Years of education: Not on file   Highest education level: Not on file  Occupational History   Occupation: Retired  Tobacco Use   Smoking status: Former    Current packs/day: 0.00    Average packs/day: 0.1 packs/day for 0.2 years    Types: Cigarettes    Start date: 09/1978    Quit date: 1980    Years since quitting: 45.2   Smokeless tobacco: Never  Vaping Use   Vaping status: Never Used  Substance and Sexual Activity   Alcohol use: No   Drug use: No   Sexual activity: Yes    Birth control/protection: Post-menopausal  Other Topics Concern   Not on file  Social History Narrative   Not on file   Social Drivers of Health   Financial Resource Strain: Not on file  Food Insecurity: Not on file  Transportation Needs: Not on file  Physical Activity: Not on file  Stress: Not on file  Social Connections: Not on file  Intimate Partner Violence: Not on file    FAMILY HISTORY: Family History  Problem Relation Age of Onset   Diabetes Mother    Hypertension Mother    Hyperlipidemia Mother    Dementia Mother    Breast cancer Neg Hx    Ovarian cancer Neg Hx    Colon cancer Neg Hx     ALLERGIES:  is allergic to abatacept, codeine, iodine, latex, shellfish allergy, trimethoprim hydrochloride [trimethoprim], and penicillins.  MEDICATIONS:  Current Outpatient Medications  Medication Sig Dispense Refill   Acetaminophen (TYLENOL EXTRA STRENGTH PO) Take 1,300 mg by mouth 2 (two) times daily as needed.     ascorbic acid (VITAMIN C) 500 MG tablet Take 500 mg by mouth daily.     aspirin  EC 81 MG tablet Take 81 mg by mouth daily.     azelastine (ASTELIN) 0.1 % nasal spray Place 2 sprays into  both nostrils 2 (two) times daily. 30 mL 5   Calcium Carbonate-Vit D-Min (CALCIUM 600+D PLUS MINERALS) 600-400 MG-UNIT TABS Take 1 tablet by mouth 2 (two) times daily.     cetirizine (ZYRTEC) 10 MG tablet Take 1 tablet (10 mg total) by mouth daily. 30 tablet 0   Cholecalciferol (D3 ADULT PO) Take 1 capsule by mouth daily.     clotrimazole-betamethasone (LOTRISONE) cream Apply topically 4 (four) times daily as needed.     desloratadine (CLARINEX) 5 MG tablet Take 5 mg by mouth daily.     docusate sodium (COLACE) 50 MG capsule Take 50 mg by mouth 2 (two) times daily.     empagliflozin (JARDIANCE) 25 MG TABS tablet Take 12.5 mg by mouth daily.     EPINEPHrine 0.3 mg/0.3 mL IJ SOAJ injection Use as directed for severe allergic reaction. 2 Device 1   etanercept (ENBREL) 25 MG injection Inject 25 mg into the skin.     fluticasone (FLONASE) 50 MCG/ACT nasal spray Place 1 spray into both nostrils 2 (two) times daily. 16 g 0   fluticasone-salmeterol (ADVAIR) 250-50 MCG/ACT AEPB Inhale 1 puff into the lungs in the morning and at bedtime.     furosemide (LASIX) 10 MG/ML solution Take by mouth daily.     gabapentin (NEURONTIN) 300 MG capsule Take 1 capsule (300 mg total) by mouth 3 (three) times daily. 90 capsule 1   hydrALAZINE (APRESOLINE) 25 MG tablet Take 25 mg by mouth 2 (two) times daily.     hydrocortisone (ANUSOL-HC) 25 MG suppository Place 1 suppository (25 mg total) rectally every 12 (twelve) hours. 12 suppository 1   ipratropium (ATROVENT) 0.02 % nebulizer solution Take 0.5 mg by nebulization every 4 (four) hours as needed.      leflunomide (ARAVA) 20 MG tablet Take 20 mg by mouth daily.     levothyroxine (SYNTHROID) 75 MCG tablet Take 75 mcg by mouth daily before breakfast.     lisinopril (PRINIVIL,ZESTRIL) 40 MG tablet Take 20 mg by mouth in the morning and at bedtime.      metoprolol  succinate (TOPROL-XL) 50 MG 24 hr tablet Take 50 mg by mouth daily. Take with or immediately following a meal.     montelukast (SINGULAIR) 10 MG tablet Take 10 mg by mouth at bedtime.     nortriptyline (PAMELOR) 25 MG capsule Take 25 mg by mouth at bedtime.     olopatadine (PATANOL) 0.1 % ophthalmic solution Place 1 drop into both eyes 2 (two) times daily. 5 mL 5   omeprazole (PRILOSEC) 10 MG capsule Take 20 mg by mouth daily.     ondansetron (ZOFRAN-ODT) 4 MG disintegrating tablet Take 1 tablet (4 mg total) by mouth every 8 (eight) hours as needed for nausea or vomiting. 15 tablet 0   pravastatin (PRAVACHOL) 40 MG tablet Take 40 mg by mouth daily.     Prenatal Vit-Fe Fumarate-FA (MULTIVITAMIN-PRENATAL) 27-0.8 MG TABS tablet Take 1 tablet by mouth daily.      senna-docusate (SENOKOT-S) 8.6-50 MG tablet Take 1 tablet by mouth at bedtime as needed for mild constipation.     tiotropium (SPIRIVA) 18 MCG inhalation capsule Place 18 mcg into inhaler and inhale daily.     verapamil (CALAN) 120 MG tablet Take 240 mg by mouth 2 (two) times daily.     azithromycin (ZITHROMAX Z-PAK) 250 MG tablet Take 2 tablets (500 mg) on  Day 1,  followed by 1 tablet (250 mg) once daily on Days 2 through 5. (  Patient not taking: Reported on 03/31/2024) 6 each 0   leflunomide (ARAVA) 20 MG tablet Take 1 tablet by mouth daily. (Patient not taking: Reported on 03/31/2024)     nitroGLYCERIN (NITROSTAT) 0.4 MG SL tablet Place under the tongue. Place 1 tablet (0.4 mg total) under the tongue every 5 (five) minutes as needed for Chest pain May take up to 3 doses.     sulfaSALAzine (AZULFIDINE) 500 MG tablet TAKE ONE TABLET BY MOUTH TWO TIMES A DAY FOR 14 DAYS, THEN TAKE TWO TABLETS TWO TIMES A DAY FOR 14 DAYS, THEN TAKE THREE TABLETS TWO TIMES A DAY FOR 62 DAYS FOR INFLAMMATION. TAKE AFTER MEALS     No current facility-administered medications for this visit.     PHYSICAL EXAMINATION: ECOG PERFORMANCE STATUS: 1 - Symptomatic but  completely ambulatory  Physical Exam Constitutional:      General: She is not in acute distress.    Appearance: She is obese.  HENT:     Head: Normocephalic and atraumatic.  Eyes:     General: No scleral icterus. Cardiovascular:     Rate and Rhythm: Normal rate and regular rhythm.     Heart sounds: Normal heart sounds.  Pulmonary:     Effort: Pulmonary effort is normal. No respiratory distress.     Breath sounds: No wheezing.  Abdominal:     General: Bowel sounds are normal. There is no distension.     Palpations: Abdomen is soft.  Musculoskeletal:        General: Deformity present. Normal range of motion.     Cervical back: Normal range of motion and neck supple.  Skin:    General: Skin is warm and dry.     Findings: No erythema or rash.  Neurological:     Mental Status: She is alert and oriented to person, place, and time. Mental status is at baseline.     Cranial Nerves: No cranial nerve deficit.     Coordination: Coordination normal.  Psychiatric:        Mood and Affect: Mood normal.     LABORATORY DATA:  I have reviewed the data as listed    Latest Ref Rng & Units 03/07/2024    1:42 PM 11/18/2023   11:57 AM 08/14/2023    2:22 PM  CBC  WBC 4.0 - 10.5 K/uL 11.1  8.5  8.1   Hemoglobin 12.0 - 15.0 g/dL 16.1  09.6  04.5   Hematocrit 36.0 - 46.0 % 38.6  42.8  40.9   Platelets 150 - 400 K/uL 278  277  259       Latest Ref Rng & Units 03/07/2024    1:42 PM 11/18/2023   11:57 AM 08/14/2023    2:22 PM  CMP  Glucose 70 - 99 mg/dL 409  811  914   BUN 8 - 23 mg/dL 16  15  23    Creatinine 0.44 - 1.00 mg/dL 7.82  9.56  2.13   Sodium 135 - 145 mmol/L 138  138  136   Potassium 3.5 - 5.1 mmol/L 3.2  3.7  3.4   Chloride 98 - 111 mmol/L 104  107  102   CO2 22 - 32 mmol/L 26  23  24    Calcium 8.9 - 10.3 mg/dL 9.0  9.7  9.4   Total Protein 6.5 - 8.1 g/dL 7.5   8.3   Total Bilirubin 0.0 - 1.2 mg/dL 0.6   0.6   Alkaline Phos 38 - 126 U/L 61  84   AST 15 - 41 U/L 15   16    ALT 0 - 44 U/L 14   13      Iron/TIBC/Ferritin/ %Sat    Component Value Date/Time   IRON 43 01/02/2021 1331   TIBC 200 (L) 01/02/2021 1331   FERRITIN 759 (H) 01/02/2021 1331   IRONPCTSAT 22 01/02/2021 1331      RADIOGRAPHIC STUDIES: I have personally reviewed the radiological images as listed and agreed with the findings in the report., No results found.

## 2024-03-31 NOTE — Assessment & Plan Note (Signed)
follow-up rheumatologist. - off Prednisone, now on Enbrel.

## 2024-03-31 NOTE — Assessment & Plan Note (Signed)
#  Neuropathy due to chemotherapy.   Stable to improved.  She has self discontinued gabapentin, manageable symptoms.

## 2024-03-31 NOTE — Assessment & Plan Note (Signed)
 Will check Folate, B12, iron tibc ferritin at next visit.

## 2024-03-31 NOTE — Assessment & Plan Note (Signed)
Continue CT surveillance.

## 2024-05-03 ENCOUNTER — Ambulatory Visit

## 2024-05-18 ENCOUNTER — Telehealth: Payer: Self-pay | Admitting: *Deleted

## 2024-05-18 NOTE — Telephone Encounter (Signed)
 Patient called today saying that she is going to have a CT scan on June 5 and the building is going to [place].  Per the patient she says that she has [facility] and it covers anything about at the cancer center.  States the reference number is [id number] it started on June 24, 2023 and it expires on August 16, 2024.  Needs somebody to straighten it out because the scan should be covered through the [place].

## 2024-06-01 ENCOUNTER — Ambulatory Visit: Admitting: Podiatry

## 2024-06-02 ENCOUNTER — Ambulatory Visit: Admission: RE | Admit: 2024-06-02 | Source: Ambulatory Visit

## 2024-06-07 ENCOUNTER — Telehealth: Payer: Self-pay

## 2024-06-07 ENCOUNTER — Telehealth: Payer: Self-pay | Admitting: *Deleted

## 2024-06-07 ENCOUNTER — Other Ambulatory Visit: Payer: Self-pay | Admitting: Oncology

## 2024-06-07 ENCOUNTER — Ambulatory Visit
Admission: RE | Admit: 2024-06-07 | Discharge: 2024-06-07 | Disposition: A | Discharge status: Home / Self Care | Source: Ambulatory Visit | Attending: Oncology | Admitting: Oncology

## 2024-06-07 DIAGNOSIS — R911 Solitary pulmonary nodule: Secondary | ICD-10-CM

## 2024-06-07 DIAGNOSIS — C541 Malignant neoplasm of endometrium: Secondary | ICD-10-CM

## 2024-06-07 MED ORDER — PREDNISONE 50 MG PO TABS: 50.0000 mg | ORAL_TABLET | ORAL | 0 refills | Status: DC

## 2024-06-07 MED ORDER — IOHEXOL 300 MG/ML  SOLN: 100.0000 mL | Freq: Once | INTRAMUSCULAR | Status: DC | PRN

## 2024-06-07 NOTE — Telephone Encounter (Signed)
 Spoke to pt and gave premed instructions for CT. Pt verbalized understanding.   Take 1 tablet (50 mg total) by mouth See admin instructions. Prednisone  - take 50 mg by mouth at 13 hours, 7 hours, and 1 hour before contrast media injection - Oral  Sent to pharmacy as: predniSONE  (DELTASONE ) 50 MG tablet

## 2024-06-07 NOTE — Telephone Encounter (Signed)
 Patient called and said that she is coming to have her scan on June 12 and she needs that 13-hour prep because she has the iodine allergy.

## 2024-06-07 NOTE — Telephone Encounter (Signed)
 Called and spoke to pt regarding premeds.

## 2024-06-09 ENCOUNTER — Ambulatory Visit
Admission: RE | Admit: 2024-06-09 | Discharge: 2024-06-09 | Disposition: A | Discharge status: Home / Self Care | Source: Ambulatory Visit | Attending: Oncology | Admitting: Oncology

## 2024-06-09 DIAGNOSIS — C541 Malignant neoplasm of endometrium: Secondary | ICD-10-CM | POA: Insufficient documentation

## 2024-06-09 DIAGNOSIS — R911 Solitary pulmonary nodule: Secondary | ICD-10-CM | POA: Diagnosis present

## 2024-06-09 MED ORDER — IOHEXOL 300 MG/ML  SOLN
100.0000 mL | Freq: Once | INTRAMUSCULAR | Status: AC | PRN
  Administered 2024-06-09: 100 mL via INTRAVENOUS

## 2024-06-10 ENCOUNTER — Inpatient Hospital Stay: Attending: Oncology | Admitting: Nurse Practitioner

## 2024-06-10 DIAGNOSIS — C541 Malignant neoplasm of endometrium: Secondary | ICD-10-CM | POA: Diagnosis not present

## 2024-06-10 NOTE — Progress Notes (Signed)
 Virtual Visit Progress Note  Symptom Management Clinic  [facility] at [facility] A Department of the [place]. [facility] [address] [place], [place] [address] [phone] (phone) [phone] (fax)  I connected with Sandra Fischer on 06/10/24 at  2:00 PM EDT by telephone visit and verified that I am speaking with the correct person using two identifiers.   I discussed the limitations, risks, security and privacy concerns of performing an evaluation and management service by telemedicine and the availability of in-person appointments. I also discussed with the patient that there may be a patient responsible charge related to this service. The patient expressed understanding and agreed to proceed.   Other persons participating in the visit and their role in the encounter: none   Patient's location: home  Provider's location: clinic     Patient Care Team: Center, [facility] as PCP - General (General Practice) Rochell Chroman, RN as Oncology Nurse Navigator Glenis Langdon, MD as Radiation Oncologist (Radiation Oncology) Timmy Forbes, MD as Consulting Physician (Oncology)   Name of the patient: Sandra Fischer  [id number]  03/29/1951   Date of visit: 06/10/24  Diagnosis- Recurrent Endometrial  Chief complaint/ Reason for visit- CT results  Heme/Onc history:  Oncology History  Endometrial cancer (HCC)  07/15/2019 Initial Diagnosis   Endometrial cancer (HCC)   11/07/2019 - 02/20/2020 Chemotherapy   Patient is on Treatment Plan : UTERINE Carboplatin   / Paclitaxel  q21d      Interval history-Sandra Fischer is a 73 year old female with history of recurrent endometrial cancer, currently on surveillance, who agrees to for telephone visit to follow-up on recent CT scan results.  She had CT which showed enlarged retroperitoneal lymph node.  She clinically is feeling well and denies complaints.  Review of systems- Review of Systems   Constitutional:  Negative for fever, malaise/fatigue and weight loss.  Gastrointestinal:  Negative for abdominal pain and nausea.  Genitourinary:  Negative for flank pain.    Current treatment- surveillance  Allergies  Allergen Reactions   Abatacept Hives   Codeine Hives, Nausea And Vomiting and Nausea Only   Iodine Swelling   Latex Itching, Hives and Rash   Shellfish Allergy Swelling   Trimethoprim  Hydrochloride [Trimethoprim ] Other (See Comments)    Pancreatitis   Penicillins Nausea And Vomiting   Past Medical History:  Diagnosis Date   Asthma    Asthma    Asthma exacerbation 08/22/2016   Cancer (HCC)    Collagen vascular disease (HCC)    Diabetes mellitus without complication (HCC)    History of Diabetes    Dyspnea    Environmental allergies    Fibromyalgia    High cholesterol    Hypertension    Hypothyroidism    Iron  deficiency anemia 07/03/2020   Neuropathy    RA (rheumatoid arthritis) (HCC)    Sleep apnea    Thyroid  disease    Past Surgical History:  Procedure Laterality Date   ABDOMINAL HYSTERECTOMY     CATARACT EXTRACTION     CHOLECYSTECTOMY     COLONOSCOPY WITH PROPOFOL  N/A 03/14/2022   Procedure: COLONOSCOPY WITH PROPOFOL ;  Surgeon: Shane Darling, MD;  Location: [facility] ENDOSCOPY;  Service: Endoscopy;  Laterality: N/A;  DM   CYSTOURETHROSCOPY     DIAGNOSTIC LAPAROSCOPY     EYE SURGERY     fibroids removed     PORTA CATH REMOVAL N/A 02/14/2021   Procedure: PORTA CATH REMOVAL;  Surgeon: Celso College, MD;  Location:  [facility] INVASIVE CV LAB;  Service: Cardiovascular;  Laterality: N/A;   THYROIDECTOMY, PARTIAL     tummy tuck     Social History   Socioeconomic History   Marital status: Married    Spouse name: Cedric    Number of children: Not on file   Years of education: Not on file   Highest education level: Not on file  Occupational History   Occupation: Retired  Tobacco Use   Smoking status: Former    Current packs/day: 0.00    Average  packs/day: 0.1 packs/day for 0.2 years    Types: Cigarettes    Start date: 09/1978    Quit date: 1980    Years since quitting: 45.4   Smokeless tobacco: Never  Vaping Use   Vaping status: Never Used  Substance and Sexual Activity   Alcohol use: No   Drug use: No   Sexual activity: Yes    Birth control/protection: Post-menopausal  Other Topics Concern   Not on file  Social History Narrative   Not on file   Social Drivers of Health   Financial Resource Strain: Not on file  Food Insecurity: Not on file  Transportation Needs: Not on file  Physical Activity: Not on file  Stress: Not on file  Social Connections: Not on file  Intimate Partner Violence: Not on file   Family History  Problem Relation Age of Onset   Diabetes Mother    Hypertension Mother    Hyperlipidemia Mother    Dementia Mother    Breast cancer Neg Hx    Ovarian cancer Neg Hx    Colon cancer Neg Hx     Current Outpatient Medications:    Acetaminophen  (TYLENOL  EXTRA STRENGTH PO), Take 1,300 mg by mouth 2 (two) times daily as needed., Disp: , Rfl:    ascorbic acid (VITAMIN C) 500 MG tablet, Take 500 mg by mouth daily., Disp: , Rfl:    aspirin  EC 81 MG tablet, Take 81 mg by mouth daily., Disp: , Rfl:    azelastine  (ASTELIN ) 0.1 % nasal spray, Place 2 sprays into both nostrils 2 (two) times daily., Disp: 30 mL, Rfl: 5   azithromycin  (ZITHROMAX  Z-PAK) 250 MG tablet, Take 2 tablets (500 mg) on  Day 1,  followed by 1 tablet (250 mg) once daily on Days 2 through 5. (Patient not taking: Reported on 03/31/2024), Disp: 6 each, Rfl: 0   Calcium Carbonate-Vit D-Min (CALCIUM 600+D PLUS MINERALS) 600-400 MG-UNIT TABS, Take 1 tablet by mouth 2 (two) times daily., Disp: , Rfl:    cetirizine  (ZYRTEC ) 10 MG tablet, Take 1 tablet (10 mg total) by mouth daily., Disp: 30 tablet, Rfl: 0   Cholecalciferol (D3 ADULT PO), Take 1 capsule by mouth daily., Disp: , Rfl:    clotrimazole-betamethasone (LOTRISONE) cream, Apply topically 4  (four) times daily as needed., Disp: , Rfl:    desloratadine (CLARINEX) 5 MG tablet, Take 5 mg by mouth daily., Disp: , Rfl:    docusate sodium (COLACE) 50 MG capsule, Take 50 mg by mouth 2 (two) times daily., Disp: , Rfl:    empagliflozin (JARDIANCE) 25 MG TABS tablet, Take 12.5 mg by mouth daily., Disp: , Rfl:    EPINEPHrine  0.3 mg/0.3 mL IJ SOAJ injection, Use as directed for severe allergic reaction., Disp: 2 Device, Rfl: 1   etanercept (ENBREL) 25 MG injection, Inject 25 mg into the skin., Disp: , Rfl:    fluticasone  (FLONASE ) 50 MCG/ACT nasal spray, Place 1 spray into both nostrils 2 (  two) times daily., Disp: 16 g, Rfl: 0   fluticasone -salmeterol (ADVAIR) 250-50 MCG/ACT AEPB, Inhale 1 puff into the lungs in the morning and at bedtime., Disp: , Rfl:    furosemide (LASIX) 10 MG/ML solution, Take by mouth daily., Disp: , Rfl:    gabapentin  (NEURONTIN ) 300 MG capsule, Take 1 capsule (300 mg total) by mouth 3 (three) times daily., Disp: 90 capsule, Rfl: 1   hydrALAZINE (APRESOLINE) 25 MG tablet, Take 25 mg by mouth 2 (two) times daily., Disp: , Rfl:    hydrocortisone  (ANUSOL -HC) 25 MG suppository, Place 1 suppository (25 mg total) rectally every 12 (twelve) hours., Disp: 12 suppository, Rfl: 1   ipratropium (ATROVENT ) 0.02 % nebulizer solution, Take 0.5 mg by nebulization every 4 (four) hours as needed. , Disp: , Rfl:    leflunomide (ARAVA) 20 MG tablet, Take 20 mg by mouth daily., Disp: , Rfl:    leflunomide (ARAVA) 20 MG tablet, Take 1 tablet by mouth daily. (Patient not taking: Reported on 03/31/2024), Disp: , Rfl:    levothyroxine (SYNTHROID) 75 MCG tablet, Take 75 mcg by mouth daily before breakfast., Disp: , Rfl:    lisinopril (PRINIVIL,ZESTRIL) 40 MG tablet, Take 20 mg by mouth in the morning and at bedtime. , Disp: , Rfl:    metoprolol succinate (TOPROL-XL) 50 MG 24 hr tablet, Take 50 mg by mouth daily. Take with or immediately following a meal., Disp: , Rfl:    montelukast  (SINGULAIR ) 10 MG  tablet, Take 10 mg by mouth at bedtime., Disp: , Rfl:    nitroGLYCERIN (NITROSTAT) 0.4 MG SL tablet, Place under the tongue. Place 1 tablet (0.4 mg total) under the tongue every 5 (five) minutes as needed for Chest pain May take up to 3 doses., Disp: , Rfl:    nortriptyline (PAMELOR) 25 MG capsule, Take 25 mg by mouth at bedtime., Disp: , Rfl:    olopatadine  (PATANOL) 0.1 % ophthalmic solution, Place 1 drop into both eyes 2 (two) times daily., Disp: 5 mL, Rfl: 5   omeprazole (PRILOSEC) 10 MG capsule, Take 20 mg by mouth daily., Disp: , Rfl:    ondansetron  (ZOFRAN -ODT) 4 MG disintegrating tablet, Take 1 tablet (4 mg total) by mouth every 8 (eight) hours as needed for nausea or vomiting., Disp: 15 tablet, Rfl: 0   potassium chloride  SA (KLOR-CON  M) 20 MEQ tablet, Take 1 tablet (20 mEq total) by mouth daily., Disp: 3 tablet, Rfl: 0   pravastatin (PRAVACHOL) 40 MG tablet, Take 40 mg by mouth daily., Disp: , Rfl:    predniSONE  (DELTASONE ) 5 MG tablet, Take 5 mg by mouth daily with breakfast., Disp: , Rfl:    predniSONE  (DELTASONE ) 50 MG tablet, Take 1 tablet (50 mg total) by mouth See admin instructions. Prednisone  - take 50 mg by mouth at 13 hours, 7 hours, and 1 hour before contrast media injection, Disp: 3 tablet, Rfl: 0   Prenatal Vit-Fe Fumarate-FA (MULTIVITAMIN-PRENATAL) 27-0.8 MG TABS tablet, Take 1 tablet by mouth daily. , Disp: , Rfl:    senna-docusate (SENOKOT-S) 8.6-50 MG tablet, Take 1 tablet by mouth at bedtime as needed for mild constipation., Disp: , Rfl:    sulfaSALAzine (AZULFIDINE) 500 MG tablet, TAKE ONE TABLET BY MOUTH TWO TIMES A DAY FOR 14 DAYS, THEN TAKE TWO TABLETS TWO TIMES A DAY FOR 14 DAYS, THEN TAKE THREE TABLETS TWO TIMES A DAY FOR 62 DAYS FOR INFLAMMATION. TAKE AFTER MEALS, Disp: , Rfl:    tiotropium (SPIRIVA) 18 MCG inhalation capsule, Place 18  mcg into inhaler and inhale daily., Disp: , Rfl:    verapamil (CALAN) 120 MG tablet, Take 240 mg by mouth 2 (two) times daily., Disp:  , Rfl:   Physical exam: Exam limited due to telemedicine  There were no vitals filed for this visit. Physical Exam Vitals reviewed.  Pulmonary:     Effort: No respiratory distress.   Neurological:     Mental Status: She is oriented to person, place, and time.     Component Ref Range & Units (hover) 3 mo ago (03/07/24) 10 mo ago (08/14/23) 1 yr ago (02/04/23) 1 yr ago (08/01/22) 2 yr ago (04/16/22) 2 yr ago (01/13/22) 2 yr ago (07/08/21)  Cancer Antigen (CA) 125 10.8 12.6 CM 15.6 CM 11.6 CM 13.2 CM 11.1 CM 11.4     CT CHEST ABDOMEN PELVIS W CONTRAST Result Date: 06/09/2024 CLINICAL DATA:  Endometrial cancer restaging * Tracking Code: BO * EXAM: CT CHEST, ABDOMEN, AND PELVIS WITH CONTRAST TECHNIQUE: Multidetector CT imaging of the chest, abdomen and pelvis was performed following the standard protocol during bolus administration of intravenous contrast. RADIATION DOSE REDUCTION: This exam was performed according to the departmental dose-optimization program which includes automated exposure control, adjustment of the mA and/or kV according to patient size and/or use of iterative reconstruction technique. CONTRAST:  OMNIPAQUE  IOHEXOL  300 MG/ML  SOLN COMPARISON:  04/29/2023 FINDINGS: CT CHEST FINDINGS Cardiovascular: Aortic atherosclerosis. Normal heart size. No pericardial effusion. Mediastinum/Nodes: No enlarged mediastinal, hilar, or axillary lymph nodes. Thyroid  gland, trachea, and esophagus demonstrate no significant findings. Lungs/Pleura: Mild diffuse bilateral bronchial wall thickening. Occasional tiny pulmonary nodules unchanged, for example in the dependent right lower lobe measuring 0.3 cm (series 4, image 76) and in the right apex measuring 0.2 cm (series 4, image 19). No pleural effusion or pneumothorax. Musculoskeletal: No chest wall abnormality. No acute osseous findings. CT ABDOMEN PELVIS FINDINGS Hepatobiliary: No focal liver abnormality is seen. Status post cholecystectomy. No  biliary dilatation. Pancreas: Unremarkable. No pancreatic ductal dilatation or surrounding inflammatory changes. Spleen: Normal in size without significant abnormality. Adrenals/Urinary Tract: Adrenal glands are unremarkable. Small nonobstructive calculus of the inferior pole of the left kidney. No right-sided calculi, ureteral calculi, or hydronephrosis. Bladder is unremarkable. Stomach/Bowel: Stomach is within normal limits. Appendix appears normal. No evidence of bowel wall thickening, distention, or inflammatory changes. Descending colonic diverticulosis. Vascular/Lymphatic: Aortic atherosclerosis. Newly enlarged left retroperitoneal lymph nodes measuring up to 2.5 x 2.2 cm (series 2, image 61). Reproductive: Status post hysterectomy. Other: Small fat containing midline ventral hernia (series 2, image 81). No ascites. Musculoskeletal: No acute osseous findings. IMPRESSION: 1. Newly enlarged left retroperitoneal lymph nodes measuring up to 2.5 x 2.2 cm, consistent with nodal metastatic disease. 2. Occasional tiny pulmonary nodules unchanged, most likely benign and incidental. 3. Status post hysterectomy. 4. Nonobstructive left nephrolithiasis. Aortic Atherosclerosis (ICD10-I70.0). Electronically Signed   By: Fredricka Jenny M.D.   On: 06/09/2024 22:01   Assessment and plan- Patient is a 73 y.o. 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 NED since. CA 125 was 10.05 March 2024. CT from 06/09/24 showed new left retroperitoneal LN measuring 2.5 x 2.2 cm concerning for recurrent disease. I've spoken with Dr Nereida Banning who feels LN amenable to biopsy. Plan to check ca 125 next week. Will have her follow up  with gyn onc for results and management.   Disposition:  IR for  biopsy Next week - lab only (ca 125) Week after biopsy- move up gyn onc visit- la   Visit Diagnosis 1. Endometrial cancer [facility])    Patient expressed understanding and was in agreement with this plan. She also understands that She can call clinic at any time with any questions, concerns, or complaints.   I discussed the assessment and treatment plan with the patient. The patient was provided an opportunity to ask questions and all were answered. The patient agreed with the plan and demonstrated an understanding of the instructions.   The patient was advised to call back or seek an in-person evaluation if the symptoms worsen or if the condition fails to improve as anticipated.  I spent 20 minutes on this telephone encounter.   Kenney Peacemaker, DNP, AGNP-C, AOCNP [facility] [phone] (clinic)

## 2024-06-15 ENCOUNTER — Inpatient Hospital Stay

## 2024-06-16 ENCOUNTER — Other Ambulatory Visit: Payer: Self-pay

## 2024-06-16 ENCOUNTER — Ambulatory Visit (INDEPENDENT_AMBULATORY_CARE_PROVIDER_SITE_OTHER): Admitting: Podiatry

## 2024-06-16 ENCOUNTER — Encounter: Payer: Self-pay | Admitting: Podiatry

## 2024-06-16 DIAGNOSIS — T451X5A Adverse effect of antineoplastic and immunosuppressive drugs, initial encounter: Secondary | ICD-10-CM

## 2024-06-16 DIAGNOSIS — M79674 Pain in right toe(s): Secondary | ICD-10-CM

## 2024-06-16 DIAGNOSIS — M79675 Pain in left toe(s): Secondary | ICD-10-CM | POA: Diagnosis not present

## 2024-06-16 DIAGNOSIS — E1142 Type 2 diabetes mellitus with diabetic polyneuropathy: Secondary | ICD-10-CM | POA: Diagnosis not present

## 2024-06-16 DIAGNOSIS — B351 Tinea unguium: Secondary | ICD-10-CM

## 2024-06-16 DIAGNOSIS — G620 Drug-induced polyneuropathy: Secondary | ICD-10-CM | POA: Diagnosis not present

## 2024-06-16 DIAGNOSIS — C541 Malignant neoplasm of endometrium: Secondary | ICD-10-CM

## 2024-06-16 NOTE — Progress Notes (Signed)
This patient returns to my office for at risk foot care.  This patient requires this care by a professional since this patient will be at risk due to having diet controlled diabetes.   This patient is unable to cut nails herself since the patient cannot reach her nails.These nails are painful walking and wearing shoes.  This patient presents for at risk foot care today.  General Appearance  Alert, conversant and in no acute stress.  Vascular  Dorsalis pedis and posterior tibial  pulses are palpable  bilaterally.  Capillary return is within normal limits  bilaterally. Temperature is within normal limits  bilaterally.  Neurologic  Senn-Weinstein monofilament wire test WNL  bilaterally. Muscle power within normal limits bilaterally.  Nails Thick disfigured discolored nails with subungual debris  from hallux to fifth toes bilaterally. No evidence of bacterial infection or drainage bilaterally.  Orthopedic  No limitations of motion  feet .  No crepitus or effusions noted.  No bony pathology or digital deformities noted.  HAV  B/L.  Skin  normotropic skin with no porokeratosis noted bilaterally.  No signs of infections or ulcers noted.     Onychomycosis  Pain in right toes  Pain in left toes  Diabetes    Consent was obtained for treatment procedures.   Mechanical debridement of nails 1-5  bilaterally performed with a nail nipper.  Filed with dremel without incident.     Return office visit   10 weeks                  Told patient to return for periodic foot care and evaluation due to potential at risk complications.   Derenda Giddings DPM  

## 2024-06-16 NOTE — Progress Notes (Signed)
 Sandra Silvas, MD sent to Sandra Fischer S PROCEDURE / BIOPSY REVIEW Date: 06/15/24  Requested Biopsy site: L paraaortic LAN Reason for request: endometrial cancer Imaging review: Best seen on CT  06/09/24  Decision: Approved Imaging modality to perform: CT Schedule with: Moderate Sedation Schedule for: Any VIR  Additional comments:   Please contact me with questions, concerns, or if issue pertaining to this request arise.  Dayne Sandra Silvas, MD [facility]

## 2024-06-17 ENCOUNTER — Inpatient Hospital Stay

## 2024-06-17 DIAGNOSIS — C541 Malignant neoplasm of endometrium: Secondary | ICD-10-CM

## 2024-06-18 LAB — CA 125: Cancer Antigen (CA) 125: 12.6 U/mL (ref 0.0–38.1)

## 2024-06-20 ENCOUNTER — Other Ambulatory Visit: Payer: Self-pay | Admitting: Radiology

## 2024-06-20 DIAGNOSIS — C541 Malignant neoplasm of endometrium: Secondary | ICD-10-CM

## 2024-06-20 NOTE — Progress Notes (Signed)
 Patient for CT guided LT paraaortic LN biopsy on Tues 06/21/24, I called and spoke with the patient on the phone and gave pre-procedure instructions. Pt was made aware to be here at 9:30a, last dose of ASA 81mg  was Thurs 06/16/24, NPO after MN prior to procedure as well as driver post procedure/recovery/discharge. Pt stated understanding.  Called 06/20/24

## 2024-06-21 ENCOUNTER — Other Ambulatory Visit: Payer: Self-pay

## 2024-06-21 ENCOUNTER — Ambulatory Visit
Admission: RE | Admit: 2024-06-21 | Discharge: 2024-06-21 | Disposition: A | Discharge status: Home / Self Care | Source: Ambulatory Visit | Attending: Nurse Practitioner | Admitting: Nurse Practitioner

## 2024-06-21 DIAGNOSIS — R590 Localized enlarged lymph nodes: Secondary | ICD-10-CM | POA: Diagnosis present

## 2024-06-21 DIAGNOSIS — C772 Secondary and unspecified malignant neoplasm of intra-abdominal lymph nodes: Secondary | ICD-10-CM | POA: Insufficient documentation

## 2024-06-21 DIAGNOSIS — C541 Malignant neoplasm of endometrium: Secondary | ICD-10-CM | POA: Diagnosis present

## 2024-06-21 LAB — CBC WITH DIFFERENTIAL/PLATELET
Abs Immature Granulocytes: 0.06 10*3/uL (ref 0.00–0.07)
Basophils Absolute: 0.1 10*3/uL (ref 0.0–0.1)
Basophils Relative: 1 %
Eosinophils Absolute: 0.3 10*3/uL (ref 0.0–0.5)
Eosinophils Relative: 4 %
HCT: 42.1 % (ref 36.0–46.0)
Hemoglobin: 12.8 g/dL (ref 12.0–15.0)
Immature Granulocytes: 1 %
Lymphocytes Relative: 38 %
Lymphs Abs: 3.1 10*3/uL (ref 0.7–4.0)
MCH: 26.2 pg (ref 26.0–34.0)
MCHC: 30.4 g/dL (ref 30.0–36.0)
MCV: 86.3 fL (ref 80.0–100.0)
Monocytes Absolute: 0.9 10*3/uL (ref 0.1–1.0)
Monocytes Relative: 11 %
Neutro Abs: 3.7 10*3/uL (ref 1.7–7.7)
Neutrophils Relative %: 45 %
Platelets: 256 10*3/uL (ref 150–400)
RBC: 4.88 MIL/uL (ref 3.87–5.11)
RDW: 16.2 % — ABNORMAL HIGH (ref 11.5–15.5)
WBC: 8.2 10*3/uL (ref 4.0–10.5)
nRBC: 0 % (ref 0.0–0.2)

## 2024-06-21 LAB — GLUCOSE, CAPILLARY
Glucose-Capillary: 131 mg/dL — ABNORMAL HIGH (ref 70–99)
Glucose-Capillary: 137 mg/dL — ABNORMAL HIGH (ref 70–99)

## 2024-06-21 MED ORDER — FENTANYL CITRATE (PF) 100 MCG/2ML IJ SOLN
INTRAMUSCULAR | Status: AC
  Filled 2024-06-21: qty 4

## 2024-06-21 MED ORDER — MIDAZOLAM HCL 2 MG/2ML IJ SOLN
INTRAMUSCULAR | Status: AC
Start: 2024-06-21 — End: 2024-06-21
  Filled 2024-06-21: qty 4

## 2024-06-21 MED ORDER — LIDOCAINE HCL 2 % IJ SOLN
INTRAMUSCULAR | Status: AC | PRN
  Administered 2024-06-21: 5 mL

## 2024-06-21 MED ORDER — SODIUM CHLORIDE 0.9 % IV SOLN: INTRAVENOUS | Status: DC

## 2024-06-21 MED ORDER — MIDAZOLAM HCL 2 MG/2ML IJ SOLN
INTRAMUSCULAR | Status: AC | PRN
  Administered 2024-06-21: 1 mg via INTRAVENOUS
  Administered 2024-06-21 (×2): .5 mg via INTRAVENOUS

## 2024-06-21 MED ORDER — FENTANYL CITRATE (PF) 100 MCG/2ML IJ SOLN
INTRAMUSCULAR | Status: AC | PRN
  Administered 2024-06-21: 25 ug via INTRAVENOUS
  Administered 2024-06-21: 50 ug via INTRAVENOUS
  Administered 2024-06-21: 25 ug via INTRAVENOUS

## 2024-06-21 NOTE — Procedures (Signed)
 Pre procedural Dx: retroperitoneal adenopathy  Post procedural Dx: Same  Technically successful CT guided biopsy of retroperitoneal mass   EBL: None.   Complications: None immediate.   KANDICE Banner, MD Pager #: [phone]

## 2024-06-21 NOTE — H&P (Signed)
 Chief Complaint: Patient was seen in consultation today for Endometrial cancer   Procedure: Retroperitoneal lymph node biopsy  Referring Physician(s): Allen,Lauren G  Supervising Physician: Jenna Hacker  Patient Status: [facility] - Out-pt  History of Present Illness: Sandra Fischer is a 73 y.o. 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 She has continued to follow with Med Onc and GYN onc as indicated. CA 125 was 10.8 in 02/2024 and her annual CT scan on 6/12 revealed:  IMPRESSION: 1. Newly enlarged left retroperitoneal lymph nodes measuring up to 2.5 x 2.2 cm, consistent with nodal metastatic disease. 2. Occasional tiny pulmonary nodules unchanged, most likely benign and incidental.  She was subsequently referred to IR for biopsy of left retroperitoneal lymph node. Most recent CA 125 on 6/20 was 12.6 > 10.8 in March.   Patient is resting in bed with her husband at the bedside. States that she is anxious to get the procedure over with. Admits to some intermittent shortness of breath and abdominal pain. Denies any chest pain, changes in bowel/bladder habits, fever/chills. NPO since midnight. States he last dose of ASA 81mg  was on 06/15/24. All questions and concerns answered at the bedside.   Code Status: Full Code  Past Medical History:  Diagnosis Date   Asthma    Asthma    Asthma exacerbation 08/22/2016   Cancer (HCC)    Collagen vascular disease (HCC)    Diabetes mellitus without complication (HCC)    History of Diabetes    Dyspnea    Environmental allergies    Fibromyalgia    High cholesterol    Hypertension    Hypothyroidism    Iron  deficiency anemia 07/03/2020   Neuropathy    RA (rheumatoid arthritis) (HCC)    Sleep apnea    Thyroid  disease     Past Surgical History:  Procedure Laterality Date   ABDOMINAL HYSTERECTOMY     CATARACT EXTRACTION     CHOLECYSTECTOMY      COLONOSCOPY WITH PROPOFOL  N/A 03/14/2022   Procedure: COLONOSCOPY WITH PROPOFOL ;  Surgeon: Maryruth Ole DASEN, MD;  Location: [facility] ENDOSCOPY;  Service: Endoscopy;  Laterality: N/A;  DM   CYSTOURETHROSCOPY     DIAGNOSTIC LAPAROSCOPY     EYE SURGERY     fibroids removed     PORTA CATH REMOVAL N/A 02/14/2021   Procedure: PORTA CATH REMOVAL;  Surgeon: Marea Selinda RAMAN, MD;  Location: [facility] INVASIVE CV LAB;  Service: Cardiovascular;  Laterality: N/A;   THYROIDECTOMY, PARTIAL     tummy tuck      Allergies: Abatacept, Codeine, Iodine, Latex, Shellfish allergy, Trimethoprim  hydrochloride [trimethoprim ], and Penicillins  Medications: Prior to Admission medications   Medication Sig Start Date End Date Taking? Authorizing Provider  Acetaminophen  (TYLENOL  EXTRA STRENGTH PO) Take 1,300 mg by mouth 2 (two) times daily as needed.    [provider]  ascorbic acid (VITAMIN C) 500 MG tablet Take 500 mg by mouth daily. 08/02/14   [provider]  aspirin  EC 81 MG tablet Take 81 mg by mouth daily.    [provider]  azelastine  (ASTELIN ) 0.1 % nasal spray Place 2 sprays into both nostrils 2 (two) times daily. 02/09/18   Bobbitt, Elgin Pepper, MD  azithromycin  (ZITHROMAX  Z-PAK) 250 MG tablet Take 2 tablets (500 mg) on  Day 1,  followed by 1 tablet (250 mg) once daily on Days 2 through 5. Patient not taking: Reported on 03/31/2024 11/18/23  Menshew, Candida LULLA Kings, PA-C  Calcium Carbonate-Vit D-Min (CALCIUM 600+D PLUS MINERALS) 600-400 MG-UNIT TABS Take 1 tablet by mouth 2 (two) times daily. 05/10/21   [provider]  cetirizine  (ZYRTEC ) 10 MG tablet Take 1 tablet (10 mg total) by mouth daily. 09/17/22   Cuthriell, Jonathan D, PA-C  Cholecalciferol (D3 ADULT PO) Take 1 capsule by mouth daily.    [provider]  clotrimazole-betamethasone (LOTRISONE) cream Apply topically 4 (four) times daily as needed. 02/07/22   [provider]  desloratadine (CLARINEX) 5 MG tablet  Take 5 mg by mouth daily.    [provider]  docusate sodium (COLACE) 50 MG capsule Take 50 mg by mouth 2 (two) times daily.    [provider]  empagliflozin (JARDIANCE) 25 MG TABS tablet Take 12.5 mg by mouth daily. 12/15/22   [provider]  EPINEPHrine  0.3 mg/0.3 mL IJ SOAJ injection Use as directed for severe allergic reaction. 05/12/18   Bobbitt, Elgin Pepper, MD  etanercept (ENBREL) 25 MG injection Inject 25 mg into the skin.    [provider]  fluticasone  (FLONASE ) 50 MCG/ACT nasal spray Place 1 spray into both nostrils 2 (two) times daily. 09/17/22   Cuthriell, Dorn BIRCH, PA-C  fluticasone -salmeterol (ADVAIR) 250-50 MCG/ACT AEPB Inhale 1 puff into the lungs in the morning and at bedtime. 08/04/20   [provider]  furosemide (LASIX) 10 MG/ML solution Take by mouth daily.    [provider]  gabapentin  (NEURONTIN ) 300 MG capsule Take 1 capsule (300 mg total) by mouth 3 (three) times daily. 07/03/20   Babara Call, MD  hydrALAZINE (APRESOLINE) 25 MG tablet Take 25 mg by mouth 2 (two) times daily. 12/06/21   [provider]  ipratropium (ATROVENT ) 0.02 % nebulizer solution Take 0.5 mg by nebulization every 4 (four) hours as needed.     [provider]  leflunomide (ARAVA) 20 MG tablet Take 20 mg by mouth daily.    [provider]  leflunomide (ARAVA) 20 MG tablet Take 1 tablet by mouth daily. Patient not taking: Reported on 03/31/2024 08/20/21 08/17/23  [provider]  levothyroxine (SYNTHROID) 75 MCG tablet Take 75 mcg by mouth daily before breakfast. 01/06/23   [provider]  lisinopril (PRINIVIL,ZESTRIL) 40 MG tablet Take 20 mg by mouth in the morning and at bedtime.     [provider]  metoprolol succinate (TOPROL-XL) 50 MG 24 hr tablet Take 50 mg by mouth daily. Take with or immediately following a meal.    [provider]  montelukast  (SINGULAIR ) 10 MG tablet Take 10 mg by mouth  at bedtime.    [provider]  nitroGLYCERIN (NITROSTAT) 0.4 MG SL tablet Place under the tongue. Place 1 tablet (0.4 mg total) under the tongue every 5 (five) minutes as needed for Chest pain May take up to 3 doses. 11/20/20 11/11/23  [provider]  nortriptyline (PAMELOR) 25 MG capsule Take 25 mg by mouth at bedtime.    [provider]  olopatadine  (PATANOL) 0.1 % ophthalmic solution Place 1 drop into both eyes 2 (two) times daily. 02/09/18   Bobbitt, Elgin Pepper, MD  omeprazole (PRILOSEC) 10 MG capsule Take 20 mg by mouth daily.    [provider]  ondansetron  (ZOFRAN -ODT) 4 MG disintegrating tablet Take 1 tablet (4 mg total) by mouth every 8 (eight) hours as needed for nausea or vomiting. 11/18/23   Menshew, Candida LULLA Kings, PA-C  potassium chloride  SA (KLOR-CON  M) 20 MEQ tablet  Take 1 tablet (20 mEq total) by mouth daily. 03/31/24   Babara Call, MD  pravastatin (PRAVACHOL) 40 MG tablet Take 40 mg by mouth daily.    [provider]  predniSONE  (DELTASONE ) 5 MG tablet Take 5 mg by mouth daily with breakfast.    [provider]  predniSONE  (DELTASONE ) 50 MG tablet Take 1 tablet (50 mg total) by mouth See admin instructions. Prednisone  - take 50 mg by mouth at 13 hours, 7 hours, and 1 hour before contrast media injection 06/07/24   Babara Call, MD  Prenatal Vit-Fe Fumarate-FA (MULTIVITAMIN-PRENATAL) 27-0.8 MG TABS tablet Take 1 tablet by mouth daily.     [provider]  senna-docusate (SENOKOT-S) 8.6-50 MG tablet Take 1 tablet by mouth at bedtime as needed for mild constipation. 06/24/19   [provider]  sulfaSALAzine (AZULFIDINE) 500 MG tablet TAKE ONE TABLET BY MOUTH TWO TIMES A DAY FOR 14 DAYS, THEN TAKE TWO TABLETS TWO TIMES A DAY FOR 14 DAYS, THEN TAKE THREE TABLETS TWO TIMES A DAY FOR 62 DAYS FOR INFLAMMATION. TAKE AFTER MEALS 02/09/23 02/10/24  [provider]  tiotropium (SPIRIVA) 18 MCG inhalation capsule Place 18 mcg  into inhaler and inhale daily.    [provider]  verapamil (CALAN) 120 MG tablet Take 240 mg by mouth 2 (two) times daily.    [provider]  prochlorperazine  (COMPAZINE ) 10 MG tablet Take 1 tablet (10 mg total) by mouth every 6 (six) hours as needed (Nausea or vomiting). 10/27/19 06/14/22  Babara Call, MD     Family History  Problem Relation Age of Onset   Diabetes Mother    Hypertension Mother    Hyperlipidemia Mother    Dementia Mother    Breast cancer Neg Hx    Ovarian cancer Neg Hx    Colon cancer Neg Hx     Social History   Socioeconomic History   Marital status: Married    Spouse name: Cedric    Number of children: Not on file   Years of education: Not on file   Highest education level: Not on file  Occupational History   Occupation: Retired  Tobacco Use   Smoking status: Former    Current packs/day: 0.00    Average packs/day: 0.1 packs/day for 0.2 years    Types: Cigarettes    Start date: 09/1978    Quit date: 1980    Years since quitting: 45.5   Smokeless tobacco: Never  Vaping Use   Vaping status: Never Used  Substance and Sexual Activity   Alcohol use: No   Drug use: No   Sexual activity: Yes    Birth control/protection: Post-menopausal  Other Topics Concern   Not on file  Social History Narrative   Not on file   Social Drivers of Health   Financial Resource Strain: Not on file  Food Insecurity: Not on file  Transportation Needs: Not on file  Physical Activity: Not on file  Stress: Not on file  Social Connections: Not on file    Review of Systems  Respiratory:  Positive for shortness of breath.   Gastrointestinal:  Positive for abdominal pain.  Denies any N/V, chest pain, fevers/chills. All other ROS negative.  Vital Signs: BP (!) 146/74   Pulse 75   Temp 98.1 F (36.7 C) (Oral)   Ht 5' 2 (1.575 m)   Wt 205 lb (93 kg)   SpO2 95%   BMI 37.49 kg/m    Physical Exam Vitals reviewed.  Constitutional:      Appearance:  Normal appearance.  HENT:     Head: Normocephalic and atraumatic.     Mouth/Throat:     Mouth: Mucous membranes are moist.     Pharynx: Oropharynx is clear.   Cardiovascular:     Rate and Rhythm: Normal rate and regular rhythm.     Heart sounds: Normal heart sounds.  Pulmonary:     Effort: Pulmonary effort is normal.     Breath sounds: Normal breath sounds.  Abdominal:     General: Abdomen is flat.     Palpations: Abdomen is soft.     Tenderness: There is abdominal tenderness (minimal left periumbilical).   Musculoskeletal:        General: Normal range of motion.     Cervical back: Normal range of motion.   Skin:    General: Skin is warm and dry.   Neurological:     General: No focal deficit present.     Mental Status: She is alert and oriented to person, place, and time. Mental status is at baseline.   Psychiatric:        Mood and Affect: Mood normal.        Behavior: Behavior normal.        Judgment: Judgment normal.     Imaging: CT CHEST ABDOMEN PELVIS W CONTRAST Result Date: 06/09/2024 CLINICAL DATA:  Endometrial cancer restaging * Tracking Code: BO * EXAM: CT CHEST, ABDOMEN, AND PELVIS WITH CONTRAST TECHNIQUE: Multidetector CT imaging of the chest, abdomen and pelvis was performed following the standard protocol during bolus administration of intravenous contrast. RADIATION DOSE REDUCTION: This exam was performed according to the departmental dose-optimization program which includes automated exposure control, adjustment of the mA and/or kV according to patient size and/or use of iterative reconstruction technique. CONTRAST:  OMNIPAQUE  IOHEXOL  300 MG/ML  SOLN COMPARISON:  04/29/2023 FINDINGS: CT CHEST FINDINGS Cardiovascular: Aortic atherosclerosis. Normal heart size. No pericardial effusion. Mediastinum/Nodes: No enlarged mediastinal, hilar, or axillary lymph nodes. Thyroid  gland, trachea, and esophagus demonstrate no significant findings. Lungs/Pleura: Mild diffuse  bilateral bronchial wall thickening. Occasional tiny pulmonary nodules unchanged, for example in the dependent right lower lobe measuring 0.3 cm (series 4, image 76) and in the right apex measuring 0.2 cm (series 4, image 19). No pleural effusion or pneumothorax. Musculoskeletal: No chest wall abnormality. No acute osseous findings. CT ABDOMEN PELVIS FINDINGS Hepatobiliary: No focal liver abnormality is seen. Status post cholecystectomy. No biliary dilatation. Pancreas: Unremarkable. No pancreatic ductal dilatation or surrounding inflammatory changes. Spleen: Normal in size without significant abnormality. Adrenals/Urinary Tract: Adrenal glands are unremarkable. Small nonobstructive calculus of the inferior pole of the left kidney. No right-sided calculi, ureteral calculi, or hydronephrosis. Bladder is unremarkable. Stomach/Bowel: Stomach is within normal limits. Appendix appears normal. No evidence of bowel wall thickening, distention, or inflammatory changes. Descending colonic diverticulosis. Vascular/Lymphatic: Aortic atherosclerosis. Newly enlarged left retroperitoneal lymph nodes measuring up to 2.5 x 2.2 cm (series 2, image 61). Reproductive: Status post hysterectomy. Other: Small fat containing midline ventral hernia (series 2, image 81). No ascites. Musculoskeletal: No acute osseous findings. IMPRESSION: 1. Newly enlarged left retroperitoneal lymph nodes measuring up to 2.5 x 2.2 cm, consistent with nodal metastatic disease. 2. Occasional tiny pulmonary nodules unchanged, most likely benign and incidental. 3. Status post hysterectomy. 4. Nonobstructive left nephrolithiasis. Aortic Atherosclerosis (ICD10-I70.0). Electronically Signed   By: Marolyn JONETTA Jaksch M.D.   On: 06/09/2024 22:01    Labs:  CBC: Recent Labs    08/14/23  1422 11/18/23 1157 03/07/24 1342 06/21/24 0958  WBC 8.1 8.5 11.1* 8.2  HGB 12.7 13.5 11.6* 12.8  HCT 40.9 42.8 38.6 42.1  PLT 259 277 278 256    COAGS: No results for  input(s): INR, APTT in the last 8760 hours.  BMP: Recent Labs    08/14/23 1422 11/18/23 1157 03/07/24 1342  NA 136 138 138  K 3.4* 3.7 3.2*  CL 102 107 104  CO2 24 23 26   GLUCOSE 121* 105* 133*  BUN 23 15 16   CALCIUM 9.4 9.7 9.0  CREATININE 0.91 0.80 0.87  GFRNONAA >60 >60 >60    LIVER FUNCTION TESTS: Recent Labs    08/14/23 1422 03/07/24 1342  BILITOT 0.6 0.6  AST 16 15  ALT 13 14  ALKPHOS 84 61  PROT 8.3* 7.5  ALBUMIN 4.0 3.5    TUMOR MARKERS: No results for input(s): AFPTM, CEA, CA199, CHROMGRNA in the last 8760 hours.  Assessment and Plan:  Endometrial Cancer w/ new lymphadenopathy: Sandra Fischer is a 73 y.o. female with a history of Endometrial cancer s/p total hysterectomy and chemotherapy who presents to [facility] Interventional Radiology department for an image-guided lymph node biopsy with Dr. KANDICE Banner. Procedure to be performed under moderate sedation.  -Plan for retroperitoneal lymph node biopsy with Dr. Banner in CT  Risks and benefits of abdominal lymph node biopsy was discussed with the patient and/or patient's family including, but not limited to bleeding, infection, damage to adjacent structures or low yield requiring additional tests.  All of the questions were answered and there is agreement to proceed.  Consent signed and in chart.   Thank you for this interesting consult. I greatly enjoyed meeting Sandra Fischer and look forward to participating in their care. A copy of this report was sent to the requesting provider on this date.  Electronically Signed: Glennon CHRISTELLA Bal, PA-C 06/21/2024, 10:31 AM   I spent a total of 40 Minutes in face to face clinical consultation, greater than 50% of which was counseling/coordinating care for abdominal mass biopsy.

## 2024-06-24 ENCOUNTER — Telehealth: Payer: Self-pay | Admitting: *Deleted

## 2024-06-24 LAB — SURGICAL PATHOLOGY

## 2024-06-24 NOTE — Telephone Encounter (Signed)
 Patient requesting a return phone call to discuss most recent scan results.

## 2024-06-27 ENCOUNTER — Telehealth: Payer: Self-pay

## 2024-06-27 ENCOUNTER — Encounter: Payer: Self-pay | Admitting: Oncology

## 2024-06-27 NOTE — Telephone Encounter (Signed)
 Called and spoke with Sandra Fischer. She as been unable to get into her my chart to see biopsy results. Provided results. Scheduled appointment with gyn onc and Dr. Babara. MMR, HER2, ER ordered on [id number].

## 2024-06-29 ENCOUNTER — Ambulatory Visit: Admitting: Oncology

## 2024-06-29 ENCOUNTER — Inpatient Hospital Stay: Attending: Oncology | Admitting: Obstetrics and Gynecology

## 2024-06-29 ENCOUNTER — Inpatient Hospital Stay: Admitting: Oncology

## 2024-06-29 ENCOUNTER — Encounter: Payer: Self-pay | Admitting: Oncology

## 2024-06-29 VITALS — BP 133/61 | HR 72 | Temp 98.6°F | Resp 17 | Ht 62.0 in | Wt 204.0 lb

## 2024-06-29 VITALS — BP 133/61 | HR 72 | Temp 98.6°F | Resp 17 | Wt 204.0 lb

## 2024-06-29 DIAGNOSIS — E860 Dehydration: Secondary | ICD-10-CM | POA: Insufficient documentation

## 2024-06-29 DIAGNOSIS — R109 Unspecified abdominal pain: Secondary | ICD-10-CM | POA: Insufficient documentation

## 2024-06-29 DIAGNOSIS — D701 Agranulocytosis secondary to cancer chemotherapy: Secondary | ICD-10-CM | POA: Insufficient documentation

## 2024-06-29 DIAGNOSIS — R911 Solitary pulmonary nodule: Secondary | ICD-10-CM | POA: Diagnosis not present

## 2024-06-29 DIAGNOSIS — G620 Drug-induced polyneuropathy: Secondary | ICD-10-CM

## 2024-06-29 DIAGNOSIS — C541 Malignant neoplasm of endometrium: Secondary | ICD-10-CM

## 2024-06-29 DIAGNOSIS — C772 Secondary and unspecified malignant neoplasm of intra-abdominal lymph nodes: Secondary | ICD-10-CM | POA: Insufficient documentation

## 2024-06-29 DIAGNOSIS — R197 Diarrhea, unspecified: Secondary | ICD-10-CM | POA: Diagnosis not present

## 2024-06-29 DIAGNOSIS — R42 Dizziness and giddiness: Secondary | ICD-10-CM | POA: Diagnosis not present

## 2024-06-29 DIAGNOSIS — Z5111 Encounter for antineoplastic chemotherapy: Secondary | ICD-10-CM | POA: Diagnosis present

## 2024-06-29 DIAGNOSIS — E871 Hypo-osmolality and hyponatremia: Secondary | ICD-10-CM | POA: Insufficient documentation

## 2024-06-29 DIAGNOSIS — T451X5A Adverse effect of antineoplastic and immunosuppressive drugs, initial encounter: Secondary | ICD-10-CM | POA: Insufficient documentation

## 2024-06-29 DIAGNOSIS — Z8542 Personal history of malignant neoplasm of other parts of uterus: Secondary | ICD-10-CM | POA: Insufficient documentation

## 2024-06-29 DIAGNOSIS — M069 Rheumatoid arthritis, unspecified: Secondary | ICD-10-CM | POA: Diagnosis not present

## 2024-06-29 DIAGNOSIS — Z90722 Acquired absence of ovaries, bilateral: Secondary | ICD-10-CM | POA: Diagnosis not present

## 2024-06-29 DIAGNOSIS — Z90710 Acquired absence of both cervix and uterus: Secondary | ICD-10-CM | POA: Diagnosis not present

## 2024-06-29 DIAGNOSIS — E876 Hypokalemia: Secondary | ICD-10-CM | POA: Diagnosis not present

## 2024-06-29 DIAGNOSIS — R599 Enlarged lymph nodes, unspecified: Secondary | ICD-10-CM

## 2024-06-29 DIAGNOSIS — Z9079 Acquired absence of other genital organ(s): Secondary | ICD-10-CM | POA: Insufficient documentation

## 2024-06-29 MED ORDER — PROCHLORPERAZINE MALEATE 10 MG PO TABS
10.0000 mg | ORAL_TABLET | Freq: Four times a day (QID) | ORAL | 1 refills | Status: AC | PRN
Start: 2024-06-29 — End: ?

## 2024-06-29 MED ORDER — ONDANSETRON HCL 8 MG PO TABS: 8.0000 mg | ORAL_TABLET | Freq: Three times a day (TID) | ORAL | 1 refills | Status: AC | PRN

## 2024-06-29 MED ORDER — DEXAMETHASONE 4 MG PO TABS: ORAL_TABLET | ORAL | 1 refills | Status: AC

## 2024-06-29 NOTE — Progress Notes (Signed)
 Gynecologic Oncology Interval Visit   Referring Provider: Dr. Janit  Chief Complaint: Recurrent stage IIIC-1 serous endometrial cancer  Subjective:  Sandra Fischer is a 73 y.o. G69P0 female (s/p laparotomy myomectomy for leiomyoma), initially seen in consultation from Dr. Janit, diagnosed with stage IIIC-1 serous endometrial cancer s/p TLH/BSO, SLN with minilap, declined adjuvant chemo & rad, developed recurrence 10/20/2019 with adenopathy, s/p 6 cycles carbo-taxol  chemotherapy with complete response who returns to clinic for discussion of recent biopsy.   06/09/24- CT C/A/P for restaging showed newly enlarged left retroperitoneal lymph node measuring up to 2.5 x 2.2 cm consistent with nodal metastatic disease. Occasional tiny pulmonary nodules unchanged and favor benign etiology. Nonobstructive left nephrolithiasis.   Discussed findings with Dr Babara and Dr Mancil who recommended biopsy.   06/21/24- Pathology:  1. Soft tissue mass, biopsy, periaortic left sided mass :  - METASTATIC POORLY DIFFERENTIATED CARCINOMA.  Diagnosis Note : The specimen demonstrates a pleomorphic high-grade epithelioid proliferation. Immunohistochemical stains were performed to characterize the tumor cells. The cells are positive for CK AE1/AE3, GATA3, CDX2, and p40. P53 is mutant type of staining. The cells are negative for PAX8, TTF-1, WT1, ER, -PR, S100 and SOX10. The immunoprofile is not specific and is not indicative of a specific site of primary. The patient's history of endometrial carcinoma is notes. The tumor most likely represents a metastasis from the patient's known endometrial carcinoma since metastatic tumor cells could aberrant loss or gain expression of lineage markers. However, metastasis from other organ system cannot be entirely excluded. Correlation with imaging findings is recommended. Controls worked appropriately.   She returns to clinic for discussion of results and treatment planning.  She is scheduled  to see Dr Babara today as well. She reports fatigue, generalized weakness and depression.        Gynecologic Oncology History:  She initially presented to Dr. Janit as [phone] female with complaint of postmenopausal bleeding, intermittent, heavy at times.    Transabdominal ultrasound on 05/05/2019 demonstrated endometrium measuring 22mm. Uterus anteverted measuring 11.3 x 6.6 x 7.9 cm, heterogeous echo texture w/o evidence of focal masses. Within uterus multiple suspected fibroids measuring 7.2 x 4.8 x 6.6 cm and 2.6 x 1.7 x 3.1 cm. Ovaries not visualized. No adnexal masses. No free fluid in cul de sac.   Endometrial biopsy was performed on 05/20/2019 and demonstrated high-grade mixed endometrioid, predominantly, and serous carcinoma.  She complains of persistent bleeding and fatigue which she attributes to the bleeding. She presents today for management. She has significant medical issues including rheumatoid arthritis requiring chronic steroids (5-10 mg daily) for a long time (she does not know how long) and leflunomide (ARAVA). She also has undergone myomectomy for leiomyoma and abdominoplasty.  She also has a history of cardiac disease. She Cardioversion for SVT on 10/17/2018. Her cardiologist Dr. Ammon. The patient presented to [facility] on 10/17[facility]2018 for chest pain and shortness of breath, noted to be in SVT, converted to sinus rhythm with adenosine , in the setting of colitis, anemia with hemoglobin 8.5, and untreated hypothyroidism with TSH 25, which has since returned to normal range.  2D echocardiogram revealed moderately reduced LV function with LVEF 35 to 40% with diffuse hypokinesis. The patient underwent Lexiscan Myoview on 01/19/2019, which revealed revealed LVEF 49%, a mild fixed inferior wall defect, scar versus artifact, with no evidence of ischemia. She was last seen on 04/26/2019 by Cardiology Romero Pierre) with a plan to stay on her current medications and she was counseled about low sodium  diet, DASH, and continuing hyperlipidemia medications.   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 She is married and provides care for her mother.   06/14/2019 CT C/A/P IMPRESSION: 1. Bulky fibroid uterus. There is no direct noncontrast CT evidence of endometrial malignancy. No evidence of lymphadenopathy or metastatic disease in the chest, abdomen, or pelvis. Please note that noncontrast CT is limited for the evaluation of solid organ metastases. 2. Stable 4 mm ground-glass pulmonary nodule of the left pulmonary apex (series 3, image 24). Although nonspecific, this is an unlikely isolated manifestation of metastatic disease. Attention on follow-up.  3. Chronic, incidental, and postoperative findings as detailed above.  She underwent TLH_BSO with Dr. Augustin on 06/21/2019.   Tumor size 9.3 cm, invading 99% of myometrium (3.55 of 3.6 cm), positive right external iliac sentinel lymph node. Negative washings. MSS/MMR - Intact/normal. HER2 - negative at 28%/1+.   Case was discussed at [facility] Tumor Board on 07/04/2019. Possible PORTEC3 regimen given subgroup analysis showing benefit in serous history vs clinical trial was discussed.   Based on pathology, adjuvant chemotherapy & radiation was recommended. She saw Dr. Babara on 07/14/2019 and lengthy discussion regarding rationale of adjuvant treatment. She declined adjuvant treatment.  10/20/2019- CT Chest Abdomen Pelvis W Contrast Multiple newly enlarged retroperitoneal and right iliac lymph nodes, the largest left retroperitoneal node measuring 1.9 x 1.6 cm (series 2, image 71), concerning for nodal metastatic disease. 3. Unchanged 4 mm ground-glass pulmonary nodule of the left pulmonary apex (series 3, image 24). This remains nonspecific although is again an unlikely manifestation of pulmonary metastatic disease.  Findings consistent with recurrent disease with adenopathy.   11/07/2019-02/20/20 - She agreed to treatment and received 6 cycles of  carbo-taxol  chemotherapy  (dose reduced d/t neuropathy).   03/12/2020 CT Abdomen/Pelvis The multiple enlarged retroperitoneal and right iliac lymph nodes identified as new on the previous exam from 10/20/2019 have resolved completely in the interval. 2. Tiny ground-glass pulmonary nodule medial left lung apex is stable in the interval. Likely benign, continued attention on follow-up recommended.  Data from GOG 258 was discussed including no differences in relapse free survival, fewer lower vaginal recurrences and fewer lower pelvic and para-aortic relapsed with the addition of RT. She was seen by Dr. Lenn who did not recommend WPRT.   Post chemotherapy CT showed excellent response. Previously enlarged retroperitoneal and right iliac nodes have resolved. Adjuvant radiation was not recommended.   CT 02/14/21 chest IMPRESSION: 1. Left apical 4 mm ground-glass nodule is unchanged, considered benign. 2. A right lower lobe solid 3 mm nodule is also not significantly changed and can be presumed benign. 3.  No acute process or evidence of metastatic disease in the chest. 4.  Aortic Atherosclerosis (ICD10-I70.0). 5. Mild hepatic steatosis. 6.  Tiny hiatal hernia.  10/29/22 CT C/A/P scan  IMPRESSION: 1. Status post hysterectomy. 2. No noncontrast evidence of recurrent or metastatic disease in the chest, abdomen, or pelvis. 3. Unchanged tiny, benign pulmonary nodules, for which no specific further follow-up or characterization is required. 4. Somewhat coarse contour of the liver, suggestive of cirrhosis. Correlate with biochemical findings. 5. Nonobstructive left nephrolithiasis.  04/17/23- ER for SOB and chest pain. Thought to be bronchitis. Treated with antibiotics.   CA 125 06/17/24 12.6 03/07/24 10.8 08/14/23 12.6 02/04/23  15.6 08/01/22  11.6 01/13/22 11.1 07/08/21  11.4 01/02/21  12.6 09/10/20 11.2 07/03/20  13.6 03/14/20 18.9 11/07/19  13  Problem List: Patient Active Problem List    Diagnosis Date  Noted   Lung nodule 02/09/2023   Age-related osteoporosis without current pathological fracture 04/22/2022   Diabetes mellitus without complication (HCC) 04/03/2021   Nuclear sclerotic cataract of right eye 04/03/2021   Severe persistent asthma 04/03/2021   Problem related to unspecified psychosocial circumstances 04/03/2021   Corn of foot 04/03/2021   Dependence on continuous positive airway pressure ventilation 04/03/2021   Dermatophytosis of nail 04/03/2021   Type 2 or unspecified type diabetes mellitus 04/03/2021   Disorder of bone and cartilage 04/03/2021   Dysphagia 04/03/2021   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   History of recurrent pneumonia 04/03/2021   Irritable colon 04/03/2021   Low back pain 04/03/2021   Other alveolar and parietoalveolar pneumonopathy 04/03/2021   Other and unspecified hyperlipidemia 04/03/2021   Other specified disease of nail 04/03/2021   Pain in joint, ankle and foot 04/03/2021   Polyp of colon 04/03/2021   Seborrheic dermatitis 04/03/2021   SVT (supraventricular tachycardia) (HCC) 04/03/2021   Vitiligo 04/03/2021   Vocal cord dysfunction 04/03/2021   Obesity 04/03/2021   Allergic asthma 04/03/2021   Obstructive sleep apnea 04/03/2021   Encounter for long-term (current) use of steroids 04/03/2021   Diabetic neuropathy (HCC) 08/02/2020   Iron  deficiency anemia 07/03/2020   Vaginal discharge 05/30/2020   Encounter for antineoplastic chemotherapy 01/30/2020   Neuropathy due to chemotherapeutic drug (HCC) 01/30/2020   Anemia 01/30/2020   Gastroesophageal reflux disease 12/08/2019   Microcytic anemia 11/07/2019   Port-A-Cath in place 11/04/2019   Rheumatoid arthritis (HCC) 10/29/2019   Goals of care, counseling/discussion 07/20/2019   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   Acute blood loss anemia 06/22/2019   Hypothyroidism, adult 06/15/2019   Myalgia and myositis 06/15/2019   Fibromyalgia    Cardiomyopathy  (HCC) 12/06/2018   Atypical chest pain 12/06/2018   Perennial and seasonal allergic rhinitis 02/09/2018   Moderate persistent asthma 02/09/2018   Allergic conjunctivitis 02/09/2018   History of food allergy 02/09/2018   Allergic reaction 02/09/2018   RA (rheumatoid arthritis) (HCC) 08/22/2016   HTN (hypertension) 08/22/2016   Past Medical History: Past Medical History:  Diagnosis Date   Asthma    Asthma    Asthma exacerbation 08/22/2016   Cancer (HCC)    Collagen vascular disease (HCC)    Diabetes mellitus without complication (HCC)    History of Diabetes    Dyspnea    Environmental allergies    Fibromyalgia    High cholesterol    Hypertension    Hypothyroidism    Iron  deficiency anemia 07/03/2020   Neuropathy    RA (rheumatoid arthritis) (HCC)    Sleep apnea    Thyroid  disease    Past Surgical History: Past Surgical History:  Procedure Laterality Date   ABDOMINAL HYSTERECTOMY     CATARACT EXTRACTION     CHOLECYSTECTOMY     COLONOSCOPY WITH PROPOFOL  N/A 03/14/2022   Procedure: COLONOSCOPY WITH PROPOFOL ;  Surgeon: Maryruth Ole DASEN, MD;  Location: [facility] ENDOSCOPY;  Service: Endoscopy;  Laterality: N/A;  DM   CYSTOURETHROSCOPY     DIAGNOSTIC LAPAROSCOPY     EYE SURGERY     fibroids removed     PORTA CATH REMOVAL N/A 02/14/2021   Procedure: PORTA CATH REMOVAL;  Surgeon: Marea Selinda RAMAN, MD;  Location: [facility] INVASIVE CV LAB;  Service: Cardiovascular;  Laterality: N/A;   THYROIDECTOMY, PARTIAL     tummy tuck     Past Gynecologic History:  As per HPI  OB History:  OB History  Gravida Para Term  Preterm AB Living  2    2   SAB IAB Ectopic Multiple Live Births  2        # Outcome Date GA Lbr Len/2nd Weight Sex Type Anes PTL Lv  2 SAB           1 SAB             Family History: Family History  Problem Relation Age of Onset   Diabetes Mother    Hypertension Mother    Hyperlipidemia Mother    Dementia Mother    Breast cancer Neg Hx    Ovarian cancer Neg Hx     Colon cancer Neg Hx    Social History: Social History   Socioeconomic History   Marital status: Married    Spouse name: Cedric    Number of children: Not on file   Years of education: Not on file   Highest education level: Not on file  Occupational History   Occupation: Retired  Tobacco Use   Smoking status: Former    Current packs/day: 0.00    Average packs/day: 0.1 packs/day for 0.2 years    Types: Cigarettes    Start date: 09/1978    Quit date: 1980    Years since quitting: 45.5   Smokeless tobacco: Never  Vaping Use   Vaping status: Never Used  Substance and Sexual Activity   Alcohol use: No   Drug use: No   Sexual activity: Yes    Birth control/protection: Post-menopausal  Other Topics Concern   Not on file  Social History Narrative   Not on file   Social Drivers of Health   Financial Resource Strain: Not on file  Food Insecurity: Not on file  Transportation Needs: Not on file  Physical Activity: Not on file  Stress: Not on file  Social Connections: Not on file  Intimate Partner Violence: Not on file   Immunization History  Administered Date(s) Administered   Fluad Quad(high Dose 65+) 11/04/2021   Influenza, High Dose Seasonal PF 10/03/2019   Influenza, Seasonal, Injecte, Preservative Fre 03/01/2013, 09/23/2013, 01/21/2016   Influenza,inj,Quad PF,6+ Mos 10/22/2016, 12/03/2017   Influenza-Unspecified 10/29/2000, 12/29/2000, 10/26/2008, 10/29/2008, 01/27/2012   PFIZER Comirnaty(Gray Top)Covid-19 Tri-Sucrose Vaccine 01/08/2021   PFIZER(Purple Top)SARS-COV-2 Vaccination 04/19/2020, 05/10/2020   Pneumococcal Conjugate-13 09/23/2013   Pneumococcal Polysaccharide-23 12/29/2000   Allergies: Allergies  Allergen Reactions   Abatacept Hives   Codeine Hives, Nausea And Vomiting and Nausea Only   Iodine Swelling   Latex Itching, Hives and Rash   Shellfish Allergy Swelling   Trimethoprim  Hydrochloride [Trimethoprim ] Other (See Comments)    Pancreatitis    Penicillins Nausea And Vomiting   Current Medications: Current Outpatient Medications  Medication Sig Dispense Refill   Acetaminophen  (TYLENOL  EXTRA STRENGTH PO) Take 1,300 mg by mouth 2 (two) times daily as needed.     ascorbic acid (VITAMIN C) 500 MG tablet Take 500 mg by mouth daily.     aspirin  EC 81 MG tablet Take 81 mg by mouth daily.     Calcium Carbonate-Vit D-Min (CALCIUM 600+D PLUS MINERALS) 600-400 MG-UNIT TABS Take 1 tablet by mouth 2 (two) times daily.     cetirizine  (ZYRTEC ) 10 MG tablet Take 1 tablet (10 mg total) by mouth daily. 30 tablet 0   Cholecalciferol (D3 ADULT PO) Take 1 capsule by mouth daily.     clotrimazole-betamethasone (LOTRISONE) cream Apply topically 4 (four) times daily as needed.     empagliflozin (JARDIANCE) 25 MG TABS tablet Take 12.5  mg by mouth daily.     EPINEPHrine  0.3 mg/0.3 mL IJ SOAJ injection Use as directed for severe allergic reaction. 2 Device 1   etanercept (ENBREL) 25 MG injection Inject 25 mg into the skin.     fluticasone  (FLONASE ) 50 MCG/ACT nasal spray Place 1 spray into both nostrils 2 (two) times daily. 16 g 0   fluticasone -salmeterol (ADVAIR) 250-50 MCG/ACT AEPB Inhale 1 puff into the lungs in the morning and at bedtime.     furosemide (LASIX) 10 MG/ML solution Take by mouth daily.     hydrALAZINE (APRESOLINE) 25 MG tablet Take 25 mg by mouth 2 (two) times daily.     ipratropium (ATROVENT ) 0.02 % nebulizer solution Take 0.5 mg by nebulization every 4 (four) hours as needed.      leflunomide (ARAVA) 20 MG tablet Take 20 mg by mouth daily.     levothyroxine (SYNTHROID) 75 MCG tablet Take 75 mcg by mouth daily before breakfast.     lisinopril (PRINIVIL,ZESTRIL) 40 MG tablet Take 20 mg by mouth in the morning and at bedtime.      metoprolol succinate (TOPROL-XL) 50 MG 24 hr tablet Take 50 mg by mouth daily. Take with or immediately following a meal.     montelukast  (SINGULAIR ) 10 MG tablet Take 10 mg by mouth at bedtime.     nitroGLYCERIN  (NITROSTAT) 0.4 MG SL tablet Place under the tongue. Place 1 tablet (0.4 mg total) under the tongue every 5 (five) minutes as needed for Chest pain May take up to 3 doses.     nortriptyline (PAMELOR) 25 MG capsule Take 25 mg by mouth at bedtime.     olopatadine  (PATANOL) 0.1 % ophthalmic solution Place 1 drop into both eyes 2 (two) times daily. 5 mL 5   omeprazole (PRILOSEC) 10 MG capsule Take 20 mg by mouth daily.     ondansetron  (ZOFRAN -ODT) 4 MG disintegrating tablet Take 1 tablet (4 mg total) by mouth every 8 (eight) hours as needed for nausea or vomiting. 15 tablet 0   potassium chloride  SA (KLOR-CON  M) 20 MEQ tablet Take 1 tablet (20 mEq total) by mouth daily. 3 tablet 0   pravastatin (PRAVACHOL) 40 MG tablet Take 40 mg by mouth daily.     predniSONE  (DELTASONE ) 5 MG tablet Take 5 mg by mouth daily with breakfast.     predniSONE  (DELTASONE ) 50 MG tablet Take 1 tablet (50 mg total) by mouth See admin instructions. Prednisone  - take 50 mg by mouth at 13 hours, 7 hours, and 1 hour before contrast media injection 3 tablet 0   Prenatal Vit-Fe Fumarate-FA (MULTIVITAMIN-PRENATAL) 27-0.8 MG TABS tablet Take 1 tablet by mouth daily.      sulfaSALAzine (AZULFIDINE) 500 MG tablet TAKE ONE TABLET BY MOUTH TWO TIMES A DAY FOR 14 DAYS, THEN TAKE TWO TABLETS TWO TIMES A DAY FOR 14 DAYS, THEN TAKE THREE TABLETS TWO TIMES A DAY FOR 62 DAYS FOR INFLAMMATION. TAKE AFTER MEALS     verapamil (CALAN) 120 MG tablet Take 240 mg by mouth 2 (two) times daily.     azelastine  (ASTELIN ) 0.1 % nasal spray Place 2 sprays into both nostrils 2 (two) times daily. (Patient not taking: Reported on 06/29/2024) 30 mL 5   azithromycin  (ZITHROMAX  Z-PAK) 250 MG tablet Take 2 tablets (500 mg) on  Day 1,  followed by 1 tablet (250 mg) once daily on Days 2 through 5. (Patient not taking: Reported on 06/29/2024) 6 each 0   desloratadine (CLARINEX) 5  MG tablet Take 5 mg by mouth daily. (Patient not taking: Reported on 06/29/2024)     docusate  sodium (COLACE) 50 MG capsule Take 50 mg by mouth 2 (two) times daily. (Patient not taking: Reported on 06/29/2024)     gabapentin  (NEURONTIN ) 300 MG capsule Take 1 capsule (300 mg total) by mouth 3 (three) times daily. (Patient not taking: Reported on 06/29/2024) 90 capsule 1   leflunomide (ARAVA) 20 MG tablet Take 1 tablet by mouth daily. (Patient not taking: Reported on 06/29/2024)     senna-docusate (SENOKOT-S) 8.6-50 MG tablet Take 1 tablet by mouth at bedtime as needed for mild constipation. (Patient not taking: Reported on 06/29/2024)     tiotropium (SPIRIVA) 18 MCG inhalation capsule Place 18 mcg into inhaler and inhale daily. (Patient not taking: Reported on 06/29/2024)     No current facility-administered medications for this visit.   Review of Systems General:  fatigue, weakness Skin: no complaints Eyes: no complaints HEENT: no complaints Breasts: no complaints Pulmonary: no complaints Cardiac: leg swelling Gastrointestinal: abdominal pain o/w no complaints Genitourinary/Sexual: no complaints Ob/Gyn: no complaints Musculoskeletal: no complaints Hematology: no complaints Neurologic/Psych: depression  Objective:  Physical Examination:  BP 133/61   Pulse 72   Temp 98.6 F (37 C)   Resp 17   Wt 204 lb (92.5 kg) Comment: Verbalized from patient  SpO2 95%   BMI 37.31 kg/m     GENERAL: Patient is a well appearing female in no acute distress HEENT:  Sclera clear. Anicteric NODES:  Negative inguinal lymph node survery LUNGS:  normal respiratory rate ABDOMEN:  Soft, nontender.  No hernias, incisions well healed. No masses or ascites EXTREMITIES:  No peripheral edema. Atraumatic. No cyanosis SKIN:  Clear with no obvious rashes or skin changes.  NEURO:  Nonfocal. Well oriented.  Appropriate affect.  Pelvic: chaperoned by CMA EGBUS: no lesions Vagina: no lesions, no discharge or bleeding Cervix: surgically absent Uterus: surgically absent BME: no palpable masses Rectovaginal:  deferred  Labs: Lab Results  Component Value Date   WBC 8.2 06/21/2024   HGB 12.8 06/21/2024   HCT 42.1 06/21/2024   MCV 86.3 06/21/2024   PLT 256 06/21/2024     Chemistry      Component Value Date/Time   NA 138 03/07/2024 1342   K 3.2 (L) 03/07/2024 1342   CL 104 03/07/2024 1342   CO2 26 03/07/2024 1342   BUN 16 03/07/2024 1342   CREATININE 0.87 03/07/2024 1342      Component Value Date/Time   CALCIUM 9.0 03/07/2024 1342   ALKPHOS 61 03/07/2024 1342   AST 15 03/07/2024 1342   ALT 14 03/07/2024 1342   BILITOT 0.6 03/07/2024 1342       Radiologic Imaging:  Per HPI    Assessment:  Sandra Fischer is a 73 y.o. 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  Tumor size 9.3 cm, invading 99% of myometrium (3.55 of 3.6cm), positive right external iliac sentinel lymph nodes. Negative washings. Recurrent disease 10/20/2019 with adenopathy up to 1.9 x 1.6 cm with CR to chemotherapy with paclitaxel /carboplatin  completed 01/2020.  Normal exam today.   CA125 normal in 2/24, but was not elevated at recurrence. Last CT scan 10/2022 reassuring and no definitive evidence of disease. Small pulmonary nodules. 04/29/23 CT again stable without specific findings of recurrent disease. Clinically asymptomatic. NED on exam.  Grade 2 peripheral neuropathy.    Hypokalemia  Abdominal pain of uncertain etiology may be due to recurrent disease.  Patient had COVID-19 in 10/22 and again in 2024.   Medical co-morbidities complicating care: She has significant medical issues including rheumatoid arthritis requiring chronic steroids (5-10 mg daily) for a long time (she does not know how long) and leflunomide (ARAVA); HTN; Dilated cardiomyopathy; hyperlipidemia; multiple intra-abdominal surgeries (myomectomy for leiomyoma, cholecystectomy, and  abdominoplasty) ; obesity Body mass index is 37.31 kg/m.  Plan:   Problem List Items Addressed This Visit       Genitourinary   Recurrent carcinoma of endometrium (HCC) - Primary   Other Visit Diagnoses       Adenopathy         Abdominal pain, unspecified abdominal location          We reviewed the results over the pathology results as well as CT scan.  I have recommended follow-up with Dr. Babara to discuss chemotherapy with paclitaxel  carboplatinum and adding immunotherapy.  It has been several years since she received chemotherapy and so I think that is the best option for her rather than pembrolizumab with lenvatinib.   Follow up in 3 months with CT scan C/A/P after three cycles.  If she has progression she may be a candidate for other options including clinical trials. Consider NGS testing on her most recent specimen  Continue follow up with Dr. Babara for chemotherapy management and electrolyte supplementation  I personally had a face to face interaction and evaluated the patient jointly with the NP, Ms. Tinnie Dawn.  I have reviewed her history and available records and have performed the all portions of the physical exam.  I have discussed the case with the APP and the patient.  I agree with the above documentation, assessment and plan which was fully formulated by me.  Counseling was completed by me. Tinnie Dawn NP, scribed this note.    Lacara Dunsworth Isidor Constable, MD

## 2024-06-29 NOTE — Progress Notes (Signed)
 Hematology/Oncology Progress note [phone] [phone]    CHIEF COMPLAINTS/REASON FOR VISIT:  Follow up for serous endometrial cancer  ASSESSMENT & PLAN:   Recurrent carcinoma of endometrium (HCC) Recurrent endometrial cancer Status post 6 cycles of chemotherapy with carboplatin  and Taxol . Patient with developed recurrent disease, retroperitoneal mass.  Imaging results and pathology biopsy were reviewed with patient and husband. Discussed the case with Dr. Elby Recommended rechallenge with carboplatin  Taxol  +/- immunotherapy. Patient has rheumatoid arthritis, currently controlled with Enbrel, may not be a candidate of immunotherapy.  Check MMR status, HER2  Rheumatoid arthritis (HCC) follow-up rheumatologist. - off Prednisone , now on Enbrel.  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   Overall improved. She has self discontinued gabapentin , manageable symptoms.   Lung nodule Stable on recent CT scans.    Orders Placed This Encounter  Procedures   CMP (Cancer Center only)    Standing Status:   Future    Expected Date:   06/30/2024    Expiration Date:   06/29/2025   TSH    Standing Status:   Future    Expected Date:   06/30/2024    Expiration Date:   06/29/2025   T4    Standing Status:   Future    Expected Date:   06/30/2024    Expiration Date:   06/29/2025   Follow-up All questions were answered. The patient knows to call the clinic with any problems, questions or concerns.  [name], MD, PhD [facility] Hematology Oncology 06/29/2024   HISTORY OF PRESENTING ILLNESS:   Sandra Fischer is a  73 y.o.  female presents for high-grade serous endometrial cancer Oncology History  Recurrent carcinoma of endometrium (HCC)  07/15/2019 Initial Diagnosis   Endometrial cancer  Patient had TH/BSO sentinel lymph node biopsy by [facility] oncology Dr. Augustin on 06/21/2019. Extensive medical records from [facility] health  system review was performed by me.  Tumor size 9.3 cm, invading 99% of myometrium [3.55 out of 3.6 cm], positive right external iliac sentinel lymph nodes.  Negative washing.  No LVI, serosa is uninvolved. 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 [id number]  FIGO stage IIIC1 MSI intact HER 2 negative.   her case was discussed at [facility] tumor board on 07/04/2019  PORTEC-3 regimen was recommended given subgroup analysis showing benefit in the serous histology  Patient declined treatment.     10/20/2019 Relapse/Recurrence   CT chest abdomen pelvis with contrast showed disease recurrence. There are multiple newly enlarged retroperitoneal and right iliac lymph nodes the largest the left retroperitoneal node measuring 1.9 x 1.6 cm concerning for nodal metastasis disease. Unchanged 4 mm groundglass pulmonary nodule of the left pulmonary apex. This remains nonspecific.    11/07/2019 - 02/20/2020 Chemotherapy   carboplatin  AUC 4.5 and paclitaxel  175 mg/m2 x 2 cycles carboplatin  AUC4.5 and paclitaxel  135 mg/m2 x 4 cycles   03/12/2020 Imaging   CT chest abdomen pelvis with contrast showed 1. The multiple enlarged retroperitoneal and right iliac lymph nodes identified as new on the previous exam from 10/20/2019 have resolved completely in the interval. 2. Tiny ground-glass pulmonary nodule medial left lung apex is stable in the interval. Likely benign, continued attention on follow-up recommended. 3. No new or progressive interval findings. 4.  Aortic Atherosclerois (ICD10-170.0   06/07/2024 Imaging   CT chest abdomen pelvis with contrast showed 1. Newly enlarged left retroperitoneal lymph nodes measuring up to 2.5 x 2.2 cm, consistent with nodal metastatic disease.  2. Occasional tiny pulmonary nodules unchanged, most likely benign and incidental. 3. Status post hysterectomy. 4. Nonobstructive left nephrolithiasis.   06/21/2024 Relapse/Recurrence   Periaortic  left side soft tissue mass biopsy showed metastatic poorly differentiated carcinoma.  Tumor most likely represents a metastasis from previous known endometrial carcinoma     INTERVAL HISTORY Sandra Fischer is a 73 y.o. female who has above history reviewed by me today presents for follow up visit for management of endometrial cancer. Recently status post retroperitoneal mass biopsy and presented to discuss results. She was seen by gynecology oncology as well. Patient reports some intermittent lower abdomen discomfort.  SHe takes Tylenol  with good symptom relief.   Review of Systems  Constitutional:  Positive for fatigue. Negative for appetite change, chills and fever.  HENT:   Negative for hearing loss and voice change.   Eyes:  Negative for eye problems.  Respiratory:  Negative for chest tightness and cough.   Cardiovascular:  Negative for chest pain.  Gastrointestinal:  Positive for abdominal pain. Negative for abdominal distention and blood in stool.  Endocrine: Negative for hot flashes.  Genitourinary:  Negative for difficulty urinating and frequency.   Musculoskeletal:  Negative for arthralgias.  Skin:  Negative for itching and rash.  Neurological:  Positive for numbness. Negative for extremity weakness.  Hematological:  Negative for adenopathy.  Psychiatric/Behavioral:  Negative for confusion.     MEDICAL HISTORY:  Past Medical History:  Diagnosis Date   Asthma    Asthma    Asthma exacerbation 08/22/2016   Cancer (HCC)    Collagen vascular disease (HCC)    Diabetes mellitus without complication (HCC)    History of Diabetes    Dyspnea    Environmental allergies    Fibromyalgia    High cholesterol    Hypertension    Hypothyroidism    Iron  deficiency anemia 07/03/2020   Neuropathy    RA (rheumatoid arthritis) (HCC)    Sleep apnea    Thyroid  disease     SURGICAL HISTORY: Past Surgical History:  Procedure Laterality Date   ABDOMINAL HYSTERECTOMY     CATARACT  EXTRACTION     CHOLECYSTECTOMY     COLONOSCOPY WITH PROPOFOL  N/A 03/14/2022   Procedure: COLONOSCOPY WITH PROPOFOL ;  Surgeon: Maryruth Ole DASEN, MD;  Location: [facility] ENDOSCOPY;  Service: Endoscopy;  Laterality: N/A;  DM   CYSTOURETHROSCOPY     DIAGNOSTIC LAPAROSCOPY     EYE SURGERY     fibroids removed     PORTA CATH REMOVAL N/A 02/14/2021   Procedure: PORTA CATH REMOVAL;  Surgeon: Marea Selinda RAMAN, MD;  Location: [facility] INVASIVE CV LAB;  Service: Cardiovascular;  Laterality: N/A;   THYROIDECTOMY, PARTIAL     tummy tuck      SOCIAL HISTORY: Social History   Socioeconomic History   Marital status: Married    Spouse name: Cedric    Number of children: Not on file   Years of education: Not on file   Highest education level: Not on file  Occupational History   Occupation: Retired  Tobacco Use   Smoking status: Former    Current packs/day: 0.00    Average packs/day: 0.1 packs/day for 0.2 years    Types: Cigarettes    Start date: 09/1978    Quit date: 1980    Years since quitting: 45.5   Smokeless tobacco: Never  Vaping Use   Vaping status: Never Used  Substance and Sexual Activity   Alcohol use: No   Drug use: No  Sexual activity: Yes    Birth control/protection: Post-menopausal  Other Topics Concern   Not on file  Social History Narrative   Not on file   Social Drivers of Health   Financial Resource Strain: Not on file  Food Insecurity: Not on file  Transportation Needs: Not on file  Physical Activity: Not on file  Stress: Not on file  Social Connections: Not on file  Intimate Partner Violence: Not on file    FAMILY HISTORY: Family History  Problem Relation Age of Onset   Diabetes Mother    Hypertension Mother    Hyperlipidemia Mother    Dementia Mother    Breast cancer Neg Hx    Ovarian cancer Neg Hx    Colon cancer Neg Hx     ALLERGIES:  is allergic to abatacept, codeine, iodine, latex, shellfish allergy, trimethoprim  hydrochloride [trimethoprim ], and  penicillins.  MEDICATIONS:  Current Outpatient Medications  Medication Sig Dispense Refill   Acetaminophen  (TYLENOL  EXTRA STRENGTH PO) Take 1,300 mg by mouth 2 (two) times daily as needed.     ascorbic acid (VITAMIN C) 500 MG tablet Take 500 mg by mouth daily.     aspirin  EC 81 MG tablet Take 81 mg by mouth daily.     azelastine  (ASTELIN ) 0.1 % nasal spray Place 2 sprays into both nostrils 2 (two) times daily. (Patient not taking: Reported on 06/29/2024) 30 mL 5   azithromycin  (ZITHROMAX  Z-PAK) 250 MG tablet Take 2 tablets (500 mg) on  Day 1,  followed by 1 tablet (250 mg) once daily on Days 2 through 5. (Patient not taking: Reported on 06/29/2024) 6 each 0   Calcium Carbonate-Vit D-Min (CALCIUM 600+D PLUS MINERALS) 600-400 MG-UNIT TABS Take 1 tablet by mouth 2 (two) times daily.     cetirizine  (ZYRTEC ) 10 MG tablet Take 1 tablet (10 mg total) by mouth daily. 30 tablet 0   Cholecalciferol (D3 ADULT PO) Take 1 capsule by mouth daily.     clotrimazole-betamethasone (LOTRISONE) cream Apply topically 4 (four) times daily as needed.     desloratadine (CLARINEX) 5 MG tablet Take 5 mg by mouth daily. (Patient not taking: Reported on 06/29/2024)     docusate sodium (COLACE) 50 MG capsule Take 50 mg by mouth 2 (two) times daily. (Patient not taking: Reported on 06/29/2024)     empagliflozin (JARDIANCE) 25 MG TABS tablet Take 12.5 mg by mouth daily.     EPINEPHrine  0.3 mg/0.3 mL IJ SOAJ injection Use as directed for severe allergic reaction. 2 Device 1   etanercept (ENBREL) 25 MG injection Inject 25 mg into the skin.     fluticasone  (FLONASE ) 50 MCG/ACT nasal spray Place 1 spray into both nostrils 2 (two) times daily. 16 g 0   fluticasone -salmeterol (ADVAIR) 250-50 MCG/ACT AEPB Inhale 1 puff into the lungs in the morning and at bedtime.     furosemide (LASIX) 10 MG/ML solution Take by mouth daily.     gabapentin  (NEURONTIN ) 300 MG capsule Take 1 capsule (300 mg total) by mouth 3 (three) times daily. (Patient not  taking: Reported on 06/29/2024) 90 capsule 1   hydrALAZINE (APRESOLINE) 25 MG tablet Take 25 mg by mouth 2 (two) times daily.     ipratropium (ATROVENT ) 0.02 % nebulizer solution Take 0.5 mg by nebulization every 4 (four) hours as needed.      leflunomide (ARAVA) 20 MG tablet Take 20 mg by mouth daily.     leflunomide (ARAVA) 20 MG tablet Take 1 tablet by mouth daily. (  Patient not taking: Reported on 06/29/2024)     levothyroxine (SYNTHROID) 75 MCG tablet Take 75 mcg by mouth daily before breakfast.     lisinopril (PRINIVIL,ZESTRIL) 40 MG tablet Take 20 mg by mouth in the morning and at bedtime.      metoprolol succinate (TOPROL-XL) 50 MG 24 hr tablet Take 50 mg by mouth daily. Take with or immediately following a meal.     montelukast  (SINGULAIR ) 10 MG tablet Take 10 mg by mouth at bedtime.     nitroGLYCERIN (NITROSTAT) 0.4 MG SL tablet Place under the tongue. Place 1 tablet (0.4 mg total) under the tongue every 5 (five) minutes as needed for Chest pain May take up to 3 doses.     nortriptyline (PAMELOR) 25 MG capsule Take 25 mg by mouth at bedtime.     olopatadine  (PATANOL) 0.1 % ophthalmic solution Place 1 drop into both eyes 2 (two) times daily. 5 mL 5   omeprazole (PRILOSEC) 10 MG capsule Take 20 mg by mouth daily.     ondansetron  (ZOFRAN -ODT) 4 MG disintegrating tablet Take 1 tablet (4 mg total) by mouth every 8 (eight) hours as needed for nausea or vomiting. 15 tablet 0   potassium chloride  SA (KLOR-CON  M) 20 MEQ tablet Take 1 tablet (20 mEq total) by mouth daily. 3 tablet 0   pravastatin (PRAVACHOL) 40 MG tablet Take 40 mg by mouth daily.     predniSONE  (DELTASONE ) 5 MG tablet Take 5 mg by mouth daily with breakfast.     predniSONE  (DELTASONE ) 50 MG tablet Take 1 tablet (50 mg total) by mouth See admin instructions. Prednisone  - take 50 mg by mouth at 13 hours, 7 hours, and 1 hour before contrast media injection 3 tablet 0   Prenatal Vit-Fe Fumarate-FA (MULTIVITAMIN-PRENATAL) 27-0.8 MG TABS  tablet Take 1 tablet by mouth daily.      senna-docusate (SENOKOT-S) 8.6-50 MG tablet Take 1 tablet by mouth at bedtime as needed for mild constipation. (Patient not taking: Reported on 06/29/2024)     sulfaSALAzine (AZULFIDINE) 500 MG tablet TAKE ONE TABLET BY MOUTH TWO TIMES A DAY FOR 14 DAYS, THEN TAKE TWO TABLETS TWO TIMES A DAY FOR 14 DAYS, THEN TAKE THREE TABLETS TWO TIMES A DAY FOR 62 DAYS FOR INFLAMMATION. TAKE AFTER MEALS     tiotropium (SPIRIVA) 18 MCG inhalation capsule Place 18 mcg into inhaler and inhale daily. (Patient not taking: Reported on 06/29/2024)     verapamil (CALAN) 120 MG tablet Take 240 mg by mouth 2 (two) times daily.     No current facility-administered medications for this visit.     PHYSICAL EXAMINATION: ECOG PERFORMANCE STATUS: 1 - Symptomatic but completely ambulatory  Physical Exam Constitutional:      General: She is not in acute distress.    Appearance: She is obese.  HENT:     Head: Normocephalic and atraumatic.  Eyes:     General: No scleral icterus. Cardiovascular:     Rate and Rhythm: Normal rate and regular rhythm.     Heart sounds: Normal heart sounds.  Pulmonary:     Effort: Pulmonary effort is normal. No respiratory distress.     Breath sounds: Normal breath sounds. No wheezing.  Abdominal:     General: Bowel sounds are normal. There is no distension.     Palpations: Abdomen is soft.  Musculoskeletal:        General: Deformity present. Normal range of motion.     Cervical back: Normal range of motion  and neck supple.  Skin:    General: Skin is warm and dry.     Findings: No erythema or rash.  Neurological:     Mental Status: She is alert and oriented to person, place, and time. Mental status is at baseline.  Psychiatric:        Mood and Affect: Mood normal.     LABORATORY DATA:  I have reviewed the data as listed    Latest Ref Rng & Units 06/21/2024    9:58 AM 03/07/2024    1:42 PM 11/18/2023   11:57 AM  CBC  WBC 4.0 - 10.5 K/uL  8.2  11.1  8.5   Hemoglobin 12.0 - 15.0 g/dL 87.1  88.3  86.4   Hematocrit 36.0 - 46.0 % 42.1  38.6  42.8   Platelets 150 - 400 K/uL 256  278  277       Latest Ref Rng & Units 03/07/2024    1:42 PM 11/18/2023   11:57 AM 08/14/2023    2:22 PM  CMP  Glucose 70 - 99 mg/dL 866  894  878   BUN 8 - 23 mg/dL 16  15  23    Creatinine 0.44 - 1.00 mg/dL 9.12  9.19  9.08   Sodium 135 - 145 mmol/L 138  138  136   Potassium 3.5 - 5.1 mmol/L 3.2  3.7  3.4   Chloride 98 - 111 mmol/L 104  107  102   CO2 22 - 32 mmol/L 26  23  24    Calcium 8.9 - 10.3 mg/dL 9.0  9.7  9.4   Total Protein 6.5 - 8.1 g/dL 7.5   8.3   Total Bilirubin 0.0 - 1.2 mg/dL 0.6   0.6   Alkaline Phos 38 - 126 U/L 61   84   AST 15 - 41 U/L 15   16   ALT 0 - 44 U/L 14   13      Iron /TIBC/Ferritin/ %Sat    Component Value Date/Time   IRON  43 01/02/2021 1331   TIBC 200 (L) 01/02/2021 1331   FERRITIN 759 (H) 01/02/2021 1331   IRONPCTSAT 22 01/02/2021 1331      RADIOGRAPHIC STUDIES: I have personally reviewed the radiological images as listed and agreed with the findings in the report., CT ABDOMINAL MASS BIOPSY Result Date: 06/21/2024 INDICATION: Retroperitoneal mass EXAM: CT-guided biopsy TECHNIQUE: Multidetector CT imaging of the abdomen was performed following the standard protocol with/without IV contrast. RADIATION DOSE REDUCTION: This exam was performed according to the departmental dose-optimization program which includes automated exposure control, adjustment of the mA and/or kV according to patient size and/or use of iterative reconstruction technique. MEDICATIONS: None. ANESTHESIA/SEDATION: Moderate (conscious) sedation was employed during this procedure. A total of Versed  2 mg and Fentanyl  100 mcg was administered intravenously by the radiology nurse. Total intra-service moderate Sedation Time: 35 minutes. The patient's level of consciousness and vital signs were monitored continuously by radiology nursing throughout the  procedure under my direct supervision. COMPLICATIONS: None immediate. PROCEDURE: Informed written consent was obtained from the patient after a thorough discussion of the procedural risks, benefits and alternatives. All questions were addressed. Maximal Sterile Barrier Technique was utilized including caps, mask, sterile gowns, sterile gloves, sterile drape, hand hygiene and skin antiseptic. A timeout was performed prior to the initiation of the procedure. With patient in a prone position and markers on the the rectal lumbar spine, initial axial images were obtained. The retroperitoneal Peri aortic mass is again identified.  The patient's skin was prepped and draped in the usual sterile fashion. Local Anesthesia was achieved by infiltrating subcutaneous tissue with 1% lidocaine  from the skin to the paraspinous muscles. A small incision was then made. He an introducer needle was then advanced in sequential fashion with intermittent imaging until the needle tip was identified at the retroperitoneal lymph node. The needle was removed leaving the cannula behind and a BioPince needle was then advanced and multiple samples were obtained and placed in formalin. Final images demonstrate no active hemorrhage. The cannula and all needles removed from the patient. Sterile dressing applied. IMPRESSION: Satisfactory CT-guided core needle biopsy of a retroperitoneal left-sided periaortic lymph node. Electronically Signed   By: Cordella Banner   On: 06/21/2024 13:33   CT CHEST ABDOMEN PELVIS W CONTRAST Result Date: 06/09/2024 CLINICAL DATA:  Endometrial cancer restaging * Tracking Code: BO * EXAM: CT CHEST, ABDOMEN, AND PELVIS WITH CONTRAST TECHNIQUE: Multidetector CT imaging of the chest, abdomen and pelvis was performed following the standard protocol during bolus administration of intravenous contrast. RADIATION DOSE REDUCTION: This exam was performed according to the departmental dose-optimization program which includes  automated exposure control, adjustment of the mA and/or kV according to patient size and/or use of iterative reconstruction technique. CONTRAST:  OMNIPAQUE  IOHEXOL  300 MG/ML  SOLN COMPARISON:  04/29/2023 FINDINGS: CT CHEST FINDINGS Cardiovascular: Aortic atherosclerosis. Normal heart size. No pericardial effusion. Mediastinum/Nodes: No enlarged mediastinal, hilar, or axillary lymph nodes. Thyroid  gland, trachea, and esophagus demonstrate no significant findings. Lungs/Pleura: Mild diffuse bilateral bronchial wall thickening. Occasional tiny pulmonary nodules unchanged, for example in the dependent right lower lobe measuring 0.3 cm (series 4, image 76) and in the right apex measuring 0.2 cm (series 4, image 19). No pleural effusion or pneumothorax. Musculoskeletal: No chest wall abnormality. No acute osseous findings. CT ABDOMEN PELVIS FINDINGS Hepatobiliary: No focal liver abnormality is seen. Status post cholecystectomy. No biliary dilatation. Pancreas: Unremarkable. No pancreatic ductal dilatation or surrounding inflammatory changes. Spleen: Normal in size without significant abnormality. Adrenals/Urinary Tract: Adrenal glands are unremarkable. Small nonobstructive calculus of the inferior pole of the left kidney. No right-sided calculi, ureteral calculi, or hydronephrosis. Bladder is unremarkable. Stomach/Bowel: Stomach is within normal limits. Appendix appears normal. No evidence of bowel wall thickening, distention, or inflammatory changes. Descending colonic diverticulosis. Vascular/Lymphatic: Aortic atherosclerosis. Newly enlarged left retroperitoneal lymph nodes measuring up to 2.5 x 2.2 cm (series 2, image 61). Reproductive: Status post hysterectomy. Other: Small fat containing midline ventral hernia (series 2, image 81). No ascites. Musculoskeletal: No acute osseous findings. IMPRESSION: 1. Newly enlarged left retroperitoneal lymph nodes measuring up to 2.5 x 2.2 cm, consistent with nodal metastatic  disease. 2. Occasional tiny pulmonary nodules unchanged, most likely benign and incidental. 3. Status post hysterectomy. 4. Nonobstructive left nephrolithiasis. Aortic Atherosclerosis (ICD10-I70.0). Electronically Signed   By: Marolyn JONETTA Jaksch M.D.   On: 06/09/2024 22:01

## 2024-06-29 NOTE — Assessment & Plan Note (Signed)
 Recurrent endometrial cancer Status post 6 cycles of chemotherapy with carboplatin  and Taxol . Patient with developed recurrent disease, retroperitoneal mass.  Imaging results and pathology biopsy were reviewed with patient and husband. Discussed the case with Dr. Elby Recommended rechallenge with carboplatin  Taxol  +/- immunotherapy. Patient has rheumatoid arthritis, currently controlled with Enbrel, may not be a candidate of immunotherapy.  Check MMR status, HER2

## 2024-06-29 NOTE — Assessment & Plan Note (Signed)
follow-up rheumatologist. - off Prednisone, now on Enbrel.

## 2024-06-29 NOTE — Assessment & Plan Note (Signed)
#  Neuropathy due to chemotherapy.   Overall improved. She has self discontinued gabapentin , manageable symptoms.

## 2024-06-29 NOTE — Assessment & Plan Note (Signed)
 Stable on recent CT scans.

## 2024-06-29 NOTE — Progress Notes (Signed)
 DISCONTINUE ON PATHWAY REGIMEN - Uterine     A cycle is every 21 days:     Paclitaxel       Carboplatin    **Always confirm dose/schedule in your pharmacy ordering system**  PRIOR TREATMENT: UTOS50: Carboplatin  AUC=6 + Paclitaxel  175 mg/m2 q21 Days x 6 Cycles  START ON PATHWAY REGIMEN - Uterine     A cycle is every 21 days:     Paclitaxel       Carboplatin    **Always confirm dose/schedule in your pharmacy ordering system**  Patient Characteristics: Serous Carcinoma, Recurrent/Progressive Disease, Second Line, Relapse ? 12 Months From Prior Therapy, HER2 Negative/Unknown Histology: Serous Carcinoma Therapeutic Status: Recurrent or Progressive Disease Line of Therapy: Second Line Time to Recurrence: Relapse ? 12 Months From Prior Therapy HER2 Status: Negative Intent of Therapy: Non-Curative / Palliative Intent, Discussed with Patient

## 2024-06-30 ENCOUNTER — Inpatient Hospital Stay

## 2024-06-30 ENCOUNTER — Telehealth: Payer: Self-pay

## 2024-06-30 ENCOUNTER — Other Ambulatory Visit: Payer: Self-pay

## 2024-06-30 DIAGNOSIS — C541 Malignant neoplasm of endometrium: Secondary | ICD-10-CM

## 2024-06-30 DIAGNOSIS — Z5111 Encounter for antineoplastic chemotherapy: Secondary | ICD-10-CM | POA: Diagnosis not present

## 2024-06-30 LAB — CMP (CANCER CENTER ONLY)
ALT: 14 U/L (ref 0–44)
AST: 21 U/L (ref 15–41)
Albumin: 3.8 g/dL (ref 3.5–5.0)
Alkaline Phosphatase: 64 U/L (ref 38–126)
Anion gap: 9 (ref 5–15)
BUN: 15 mg/dL (ref 8–23)
CO2: 27 mmol/L (ref 22–32)
Calcium: 9 mg/dL (ref 8.9–10.3)
Chloride: 102 mmol/L (ref 98–111)
Creatinine: 0.88 mg/dL (ref 0.44–1.00)
GFR, Estimated: >60 mL/min (ref >60)
Glucose, Bld: 139 mg/dL — ABNORMAL HIGH (ref 70–99)
Potassium: 2.9 mmol/L — ABNORMAL LOW (ref 3.5–5.1)
Sodium: 138 mmol/L (ref 135–145)
Total Bilirubin: 0.7 mg/dL (ref 0.0–1.2)
Total Protein: 7.6 g/dL (ref 6.5–8.1)

## 2024-06-30 LAB — TSH: TSH: 10.356 u[IU]/mL — ABNORMAL HIGH (ref 0.350–4.500)

## 2024-06-30 MED ORDER — POTASSIUM CHLORIDE CRYS ER 20 MEQ PO TBCR
20.0000 meq | EXTENDED_RELEASE_TABLET | Freq: Every day | ORAL | 0 refills | Status: DC
Start: 2024-06-30 — End: 2024-08-19

## 2024-06-30 NOTE — Telephone Encounter (Signed)
 Spoke to pt and informed her about tx appt times and premeds sent to pharmacy. Pt has decided to hold off on port placement for now.

## 2024-06-30 NOTE — Telephone Encounter (Signed)
-----   Message from [facility] sent at 06/30/2024  8:17 AM EDT ----- Appts from [facility] have been scheduled. Glenford Garis/Josh when you call pt to ask about mediport will you confirm appts with her? I know she has a lab today and I did put in the notes for lab to print AVS for pt. ----- Message ----- From: Babara Call, MD Sent: 06/29/2024  10:01 PM EDT To: Almarie JINNY Nett, RN; Fonda KANDICE Sax, CMA#  I have signed the chemo IS for next week. Please check with patient if she wants to have Mediport placed this time or not. Premeds has been sent to her pharmacy.

## 2024-06-30 NOTE — Telephone Encounter (Signed)
 Rx for Potassium 20 mEq daily sent to [facility]. Patient notified.

## 2024-06-30 NOTE — Progress Notes (Signed)
 Pharmacist Chemotherapy Monitoring - Initial Assessment    Anticipated start date: 07/08/24   The following has been reviewed per standard work regarding the patient's treatment regimen: The patient's diagnosis, treatment plan and drug doses, and organ/hematologic function Lab orders and baseline tests specific to treatment regimen  The treatment plan start date, drug sequencing, and pre-medications Prior authorization status  Patient's documented medication list, including drug-drug interaction screen and prescriptions for anti-emetics and supportive care specific to the treatment regimen The drug concentrations, fluid compatibility, administration routes, and timing of the medications to be used The patient's access for treatment and lifetime cumulative dose history, if applicable  The patient's medication allergies and previous infusion related reactions, if applicable   Changes made to treatment plan:  Recurrent endometrial cancer Status post 6 cycles of chemotherapy with carboplatin  and Taxol . Patient with developed recurrent disease, retroperitoneal mass.   Imaging results and pathology biopsy were reviewed with patient and husband. Discussed the case with Dr. Elby Recommended rechallenge with carboplatin  Taxol  +/- immunotherapy. Patient has rheumatoid arthritis, currently controlled with Enbrel, may not be a candidate of immunotherapy.  Follow up needed:  Check MMR status, HER  Re challenge with taxol  Sandra  Redell JINNY Fischer, RPH, 06/30/2024  3:05 PM

## 2024-06-30 NOTE — Addendum Note (Signed)
 Addended by: BABARA CALL on: 06/30/2024 03:51 PM   Modules accepted: Orders

## 2024-07-02 LAB — T4: T4, Total: 4.1 ug/dL — ABNORMAL LOW (ref 4.5–12.0)

## 2024-07-04 ENCOUNTER — Encounter: Payer: Self-pay | Admitting: Oncology

## 2024-07-05 ENCOUNTER — Other Ambulatory Visit: Payer: Self-pay | Admitting: Oncology

## 2024-07-05 ENCOUNTER — Telehealth: Payer: Self-pay | Admitting: *Deleted

## 2024-07-05 MED ORDER — FLUCONAZOLE 150 MG PO TABS
150.0000 mg | ORAL_TABLET | Freq: Every day | ORAL | 0 refills | Status: DC
Start: 2024-07-05 — End: 2024-08-19

## 2024-07-05 NOTE — Telephone Encounter (Signed)
 Patient called today stating that after 150 prednisone  and Lasix is causing the odor again and she needs medicine to help.  He states the steroids that she had to take for her scan. She states that when she has  a lot or prednisone  she gets diflucan  and it usually takes 3 tablets before it goes away

## 2024-07-05 NOTE — Telephone Encounter (Signed)
 Dr. Babara has empirically sent 1 dose of Diflucan  150mg . Pt notifed.

## 2024-07-07 ENCOUNTER — Encounter: Payer: Self-pay | Admitting: Oncology

## 2024-07-07 ENCOUNTER — Other Ambulatory Visit: Payer: Self-pay

## 2024-07-07 DIAGNOSIS — C541 Malignant neoplasm of endometrium: Secondary | ICD-10-CM

## 2024-07-08 ENCOUNTER — Encounter: Payer: Self-pay | Admitting: Oncology

## 2024-07-08 ENCOUNTER — Inpatient Hospital Stay

## 2024-07-08 ENCOUNTER — Ambulatory Visit: Admitting: Oncology

## 2024-07-08 ENCOUNTER — Inpatient Hospital Stay (HOSPITAL_BASED_OUTPATIENT_CLINIC_OR_DEPARTMENT_OTHER): Admitting: Oncology

## 2024-07-08 ENCOUNTER — Ambulatory Visit

## 2024-07-08 ENCOUNTER — Other Ambulatory Visit

## 2024-07-08 VITALS — BP 130/71 | HR 66 | Resp 16

## 2024-07-08 VITALS — BP 139/59 | HR 62 | Temp 98.8°F | Resp 18 | Wt 208.1 lb

## 2024-07-08 DIAGNOSIS — R911 Solitary pulmonary nodule: Secondary | ICD-10-CM | POA: Diagnosis not present

## 2024-07-08 DIAGNOSIS — M069 Rheumatoid arthritis, unspecified: Secondary | ICD-10-CM

## 2024-07-08 DIAGNOSIS — C541 Malignant neoplasm of endometrium: Secondary | ICD-10-CM

## 2024-07-08 DIAGNOSIS — Z5111 Encounter for antineoplastic chemotherapy: Secondary | ICD-10-CM | POA: Diagnosis not present

## 2024-07-08 DIAGNOSIS — G620 Drug-induced polyneuropathy: Secondary | ICD-10-CM | POA: Diagnosis not present

## 2024-07-08 LAB — CBC WITH DIFFERENTIAL (CANCER CENTER ONLY)
Abs Immature Granulocytes: 0.05 K/uL (ref 0.00–0.07)
Basophils Absolute: 0.1 K/uL (ref 0.0–0.1)
Basophils Relative: 1 %
Eosinophils Absolute: 0.3 K/uL (ref 0.0–0.5)
Eosinophils Relative: 4 %
HCT: 42.8 % (ref 36.0–46.0)
Hemoglobin: 12.7 g/dL (ref 12.0–15.0)
Immature Granulocytes: 1 %
Lymphocytes Relative: 49 %
Lymphs Abs: 3.6 K/uL (ref 0.7–4.0)
MCH: 26.3 pg (ref 26.0–34.0)
MCHC: 29.7 g/dL — ABNORMAL LOW (ref 30.0–36.0)
MCV: 88.6 fL (ref 80.0–100.0)
Monocytes Absolute: 0.6 K/uL (ref 0.1–1.0)
Monocytes Relative: 9 %
Neutro Abs: 2.6 K/uL (ref 1.7–7.7)
Neutrophils Relative %: 36 %
Platelet Count: 285 K/uL (ref 150–400)
RBC: 4.83 MIL/uL (ref 3.87–5.11)
RDW: 16.2 % — ABNORMAL HIGH (ref 11.5–15.5)
WBC Count: 7.3 K/uL (ref 4.0–10.5)
nRBC: 0 % (ref 0.0–0.2)

## 2024-07-08 LAB — CMP (CANCER CENTER ONLY)
ALT: 15 U/L (ref 0–44)
AST: 20 U/L (ref 15–41)
Albumin: 3.8 g/dL (ref 3.5–5.0)
Alkaline Phosphatase: 64 U/L (ref 38–126)
Anion gap: 11 (ref 5–15)
BUN: 18 mg/dL (ref 8–23)
CO2: 24 mmol/L (ref 22–32)
Calcium: 9 mg/dL (ref 8.9–10.3)
Chloride: 102 mmol/L (ref 98–111)
Creatinine: 1.09 mg/dL — ABNORMAL HIGH (ref 0.44–1.00)
GFR, Estimated: 55 mL/min — ABNORMAL LOW (ref >60)
Glucose, Bld: 160 mg/dL — ABNORMAL HIGH (ref 70–99)
Potassium: 3.9 mmol/L (ref 3.5–5.1)
Sodium: 137 mmol/L (ref 135–145)
Total Bilirubin: 0.8 mg/dL (ref 0.0–1.2)
Total Protein: 7.4 g/dL (ref 6.5–8.1)

## 2024-07-08 MED ORDER — SODIUM CHLORIDE 0.9 % IV SOLN
427.9500 mg | Freq: Once | INTRAVENOUS | Status: AC
  Administered 2024-07-08: 430 mg via INTRAVENOUS
  Filled 2024-07-08: qty 43

## 2024-07-08 MED ORDER — DIPHENHYDRAMINE HCL 50 MG/ML IJ SOLN
50.0000 mg | Freq: Once | INTRAMUSCULAR | Status: AC
  Administered 2024-07-08: 50 mg via INTRAVENOUS
  Filled 2024-07-08: qty 1

## 2024-07-08 MED ORDER — PALONOSETRON HCL INJECTION 0.25 MG/5ML
0.2500 mg | Freq: Once | INTRAVENOUS | Status: AC
  Administered 2024-07-08: 0.25 mg via INTRAVENOUS
  Filled 2024-07-08: qty 5

## 2024-07-08 MED ORDER — SODIUM CHLORIDE 0.9 % IV SOLN
135.0000 mg/m2 | Freq: Once | INTRAVENOUS | Status: AC
  Administered 2024-07-08: 270 mg via INTRAVENOUS
  Filled 2024-07-08: qty 45

## 2024-07-08 MED ORDER — DEXAMETHASONE SODIUM PHOSPHATE 10 MG/ML IJ SOLN
10.0000 mg | Freq: Once | INTRAMUSCULAR | Status: AC
  Administered 2024-07-08: 10 mg via INTRAVENOUS
  Filled 2024-07-08: qty 1

## 2024-07-08 MED ORDER — SODIUM CHLORIDE 0.9 % IV SOLN
INTRAVENOUS | Status: DC
Start: 2024-07-08 — End: 2024-07-08
  Filled 2024-07-08: qty 250

## 2024-07-08 MED ORDER — APREPITANT 130 MG/18ML IV EMUL
130.0000 mg | Freq: Once | INTRAVENOUS | Status: AC
  Administered 2024-07-08: 130 mg via INTRAVENOUS
  Filled 2024-07-08: qty 18

## 2024-07-08 MED ORDER — FAMOTIDINE IN NACL 20-0.9 MG/50ML-% IV SOLN
20.0000 mg | Freq: Once | INTRAVENOUS | Status: AC
  Administered 2024-07-08: 20 mg via INTRAVENOUS
  Filled 2024-07-08: qty 50

## 2024-07-08 NOTE — Patient Instructions (Signed)
 [facility] - A DEPT OF [facility]  Discharge Instructions: Thank you for choosing [facility] to provide your oncology and hematology care.  If you have a lab appointment with the Cancer Center, please go directly to the Cancer Center and check in at the registration area.  Wear comfortable clothing and clothing appropriate for easy access to any Portacath or PICC line.   We strive to give you quality time with your provider. You may need to reschedule your appointment if you arrive late (15 or more minutes).  Arriving late affects you and other patients whose appointments are after yours.  Also, if you miss three or more appointments without notifying the office, you may be dismissed from the clinic at the provider's discretion.      For prescription refill requests, have your pharmacy contact our office and allow 72 hours for refills to be completed.    Today you received the following chemotherapy and/or immunotherapy agents taxol /carboplatin       To help prevent nausea and vomiting after your treatment, we encourage you to take your nausea medication as directed.  BELOW ARE SYMPTOMS THAT SHOULD BE REPORTED IMMEDIATELY: *FEVER GREATER THAN 100.4 F (38 C) OR HIGHER *CHILLS OR SWEATING *NAUSEA AND VOMITING THAT IS NOT CONTROLLED WITH YOUR NAUSEA MEDICATION *UNUSUAL SHORTNESS OF BREATH *UNUSUAL BRUISING OR BLEEDING *URINARY PROBLEMS (pain or burning when urinating, or frequent urination) *BOWEL PROBLEMS (unusual diarrhea, constipation, pain near the anus) TENDERNESS IN MOUTH AND THROAT WITH OR WITHOUT PRESENCE OF ULCERS (sore throat, sores in mouth, or a toothache) UNUSUAL RASH, SWELLING OR PAIN  UNUSUAL VAGINAL DISCHARGE OR ITCHING   Items with * indicate a potential emergency and should be followed up as soon as possible or go to the Emergency Department if any problems should occur.  Please show the CHEMOTHERAPY ALERT CARD or  IMMUNOTHERAPY ALERT CARD at check-in to the Emergency Department and triage nurse.  Should you have questions after your visit or need to cancel or reschedule your appointment, please contact [facility] - A DEPT OF [facility]  [phone] and follow the prompts.  Office hours are 8:00 a.m. to 4:30 p.m. Monday - Friday. Please note that voicemails left after 4:00 p.m. may not be returned until the following business day.  We are closed weekends and major holidays. You have access to a nurse at all times for urgent questions. Please call the main number to the clinic [phone] and follow the prompts.  For any non-urgent questions, you may also contact your provider using MyChart. We now offer e-Visits for anyone 19 and older to request care online for non-urgent symptoms. For details visit [email or web address].   Also download the MyChart app! Go to the app store, search MyChart, open the app, select [facility], and log in with your MyChart username and password.

## 2024-07-08 NOTE — Progress Notes (Signed)
 Hematology/Oncology Progress note [phone] [phone]    CHIEF COMPLAINTS/REASON FOR VISIT:  Follow up for serous endometrial cancer  ASSESSMENT & PLAN:   Recurrent carcinoma of endometrium (HCC) Recurrent endometrial cancer Status post 6 cycles of chemotherapy with carboplatin  and Taxol . Patient with developed recurrent disease, retroperitoneal mass-> biopsy proven recurrence.pending MMR status, HER2  Labs are reviewed and discussed with patient.  Proceed with carboplatin  AUC 4.5 Taxol  135mg /m2 - [dosage she was able to tolerate previously]  Lung nodule Stable on recent CT scans.  Neuropathy due to chemotherapeutic drug Sandra [facility]) #Neuropathy due to chemotherapy.   Overall improved. She has self discontinued gabapentin , manageable symptoms.   Rheumatoid arthritis (HCC) follow-up rheumatologist. - off Prednisone , now on Enbrel.    Orders Placed This Encounter  Procedures   CBC with Differential ([facility] Only)    Standing Status:   Future    Expected Date:   07/08/2024    Expiration Date:   07/08/2025   CMP ([facility] only)    Standing Status:   Future    Expected Date:   07/08/2024    Expiration Date:   07/08/2025   CBC with Differential ([facility] Only)    Standing Status:   Future    Expected Date:   07/29/2024    Expiration Date:   07/29/2025   CMP ([facility] only)    Standing Status:   Future    Expected Date:   07/29/2024    Expiration Date:   07/29/2025   CBC with Differential ([facility] Only)    Standing Status:   Future    Expected Date:   08/19/2024    Expiration Date:   08/19/2025   CMP ([facility] only)    Standing Status:   Future    Expected Date:   08/19/2024    Expiration Date:   08/19/2025   Follow-up 3 weeks All questions were answered. The patient knows to call the clinic with any problems, questions or concerns.  Sandra Cap, MD, PhD [facility] Hematology Oncology 07/08/2024   HISTORY OF PRESENTING ILLNESS:    Sandra Fischer is a  73 y.o.  female presents for high-grade serous endometrial cancer Oncology History  Recurrent carcinoma of endometrium (HCC)  07/15/2019 Initial Diagnosis   Endometrial cancer  Patient had TH/BSO sentinel lymph node biopsy by [facility] oncology Dr. Augustin on 06/21/2019. Extensive medical records from [facility] review was performed by me.  Tumor size 9.3 cm, invading 99% of myometrium [3.55 out of 3.6 cm], positive right external iliac sentinel lymph nodes.  Negative washing.  No LVI, serosa is uninvolved. 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 [id number]  FIGO stage IIIC1 MSI intact HER 2 negative.   her case was discussed at [facility] tumor board on 07/04/2019  PORTEC-3 regimen was recommended given subgroup analysis showing benefit in the serous histology  Patient declined treatment.     10/20/2019 Relapse/Recurrence   CT chest abdomen pelvis with contrast showed disease recurrence. There are multiple newly enlarged retroperitoneal and right iliac lymph nodes the largest the left retroperitoneal node measuring 1.9 x 1.6 cm concerning for nodal metastasis disease. Unchanged 4 mm groundglass pulmonary nodule of the left pulmonary apex. This remains nonspecific.    11/07/2019 - 02/20/2020 Chemotherapy   carboplatin  AUC 4.5 and paclitaxel  175 mg/m2 x 2 cycles carboplatin  AUC4.5 and paclitaxel  135 mg/m2 x 4 cycles   03/12/2020 Imaging   CT chest abdomen pelvis with contrast showed 1.  The multiple enlarged retroperitoneal and right iliac lymph nodes identified as new on the previous exam from 10/20/2019 have resolved completely in the interval. 2. Tiny ground-glass pulmonary nodule medial left lung apex is stable in the interval. Likely benign, continued attention on follow-up recommended. 3. No new or progressive interval findings. 4.  Aortic Atherosclerois (ICD10-170.0   06/07/2024 Imaging   CT chest abdomen  pelvis with contrast showed 1. Newly enlarged left retroperitoneal lymph nodes measuring up to 2.5 x 2.2 cm, consistent with nodal metastatic disease. 2. Occasional tiny pulmonary nodules unchanged, most likely benign and incidental. 3. Status post hysterectomy. 4. Nonobstructive left nephrolithiasis.   06/21/2024 Relapse/Recurrence   Periaortic left side soft tissue mass biopsy showed metastatic poorly differentiated carcinoma.    Diagnosis Note : The specimen demonstrates a pleomorphic high-grade epithelioid proliferation. Immunohistochemical stains were performed to characterize the tumor cells. The cells are positive for CK AE1/AE3, GATA3, CDX2, and p40. P53 is mutant type of staining. The cells are negative for PAX8, TTF-1, WT1, ER, -PR, S100 and SOX10. The immunoprofile is not specific and is not indicative of a specific site of primary. The patient's history of endometrial carcinoma is notes. The tumor most likely represents a metastasis from the patient's known endometrial carcinoma since metastatic tumor cells could aberrant loss or gain expression of lineage markers. However, metastasis from other organ system cannot be entirely excluded. Correlation with imaging findings is recommended. Controls worked appropriately.    07/08/2024 -  Chemotherapy   Patient is on Treatment Plan : UTERINE Carboplatin  + Paclitaxel  q21d       INTERVAL HISTORY Sandra Fischer is a 73 y.o. female who has above history reviewed by me today presents for follow up visit for management of endometrial cancer. Recently status post retroperitoneal mass biopsy and presented to discuss results. She was seen by gynecology oncology as well. Patient reports some intermittent lower abdomen discomfort.  SHe takes Tylenol  with good symptom relief.   Review of Systems  Constitutional:  Positive for fatigue. Negative for appetite change, chills and fever.  HENT:   Negative for hearing loss and voice change.   Eyes:   Negative for eye problems.  Respiratory:  Negative for chest tightness and cough.   Cardiovascular:  Negative for chest pain.  Gastrointestinal:  Negative for abdominal distention, abdominal pain and blood in stool.  Endocrine: Negative for hot flashes.  Genitourinary:  Negative for difficulty urinating and frequency.   Musculoskeletal:  Negative for arthralgias.  Skin:  Negative for itching and rash.  Neurological:  Positive for numbness. Negative for extremity weakness.  Hematological:  Negative for adenopathy.  Psychiatric/Behavioral:  Negative for confusion.     MEDICAL HISTORY:  Past Medical History:  Diagnosis Date   Asthma    Asthma    Asthma exacerbation 08/22/2016   Cancer (HCC)    Collagen vascular disease (HCC)    Diabetes mellitus without complication (HCC)    History of Diabetes    Dyspnea    Environmental allergies    Fibromyalgia    High cholesterol    Hypertension    Hypothyroidism    Iron  deficiency anemia 07/03/2020   Neuropathy    RA (rheumatoid arthritis) (HCC)    Sleep apnea    Thyroid  disease     SURGICAL HISTORY: Past Surgical History:  Procedure Laterality Date   ABDOMINAL HYSTERECTOMY     CATARACT EXTRACTION     CHOLECYSTECTOMY     COLONOSCOPY WITH PROPOFOL  N/A 03/14/2022   Procedure: COLONOSCOPY  WITH PROPOFOL ;  Surgeon: Maryruth Ole DASEN, MD;  Location: [facility];  Service: Endoscopy;  Laterality: N/A;  DM   CYSTOURETHROSCOPY     DIAGNOSTIC LAPAROSCOPY     EYE SURGERY     fibroids removed     PORTA CATH REMOVAL N/A 02/14/2021   Procedure: PORTA CATH REMOVAL;  Surgeon: Marea Selinda RAMAN, MD;  Location: [facility] INVASIVE CV LAB;  Service: Cardiovascular;  Laterality: N/A;   THYROIDECTOMY, PARTIAL     tummy tuck      SOCIAL HISTORY: Social History   Socioeconomic History   Marital status: Married    Spouse name: Cedric    Number of children: Not on file   Years of education: Not on file   Highest education level: Not on file   Occupational History   Occupation: Retired  Tobacco Use   Smoking status: Former    Current packs/day: 0.00    Average packs/day: 0.1 packs/day for 0.2 years    Types: Cigarettes    Start date: 09/1978    Quit date: 1980    Years since quitting: 45.5   Smokeless tobacco: Never  Vaping Use   Vaping status: Never Used  Substance and Sexual Activity   Alcohol use: No   Drug use: No   Sexual activity: Yes    Birth control/protection: Post-menopausal  Other Topics Concern   Not on file  Social History Narrative   Not on file   Social Drivers of Health   Financial Resource Strain: Not on file  Food Insecurity: Not on file  Transportation Needs: Not on file  Physical Activity: Not on file  Stress: Not on file  Social Connections: Not on file  Intimate Partner Violence: Not on file    FAMILY HISTORY: Family History  Problem Relation Age of Onset   Diabetes Mother    Hypertension Mother    Hyperlipidemia Mother    Dementia Mother    Breast cancer Neg Hx    Ovarian cancer Neg Hx    Colon cancer Neg Hx     ALLERGIES:  is allergic to abatacept, codeine, iodine, latex, shellfish allergy, trimethoprim  hydrochloride [trimethoprim ], and penicillins.  MEDICATIONS:  Current Outpatient Medications  Medication Sig Dispense Refill   Acetaminophen  (TYLENOL  EXTRA STRENGTH PO) Take 1,300 mg by mouth 2 (two) times daily as needed.     ascorbic acid (VITAMIN C) 500 MG tablet Take 500 mg by mouth daily.     aspirin  EC 81 MG tablet Take 81 mg by mouth daily.     azelastine  (ASTELIN ) 0.1 % nasal spray Place 2 sprays into both nostrils 2 (two) times daily. (Patient not taking: Reported on 06/29/2024) 30 mL 5   azithromycin  (ZITHROMAX  Z-PAK) 250 MG tablet Take 2 tablets (500 mg) on  Day 1,  followed by 1 tablet (250 mg) once daily on Days 2 through 5. (Patient not taking: Reported on 06/29/2024) 6 each 0   Calcium Carbonate-Vit D-Min (CALCIUM 600+D PLUS MINERALS) 600-400 MG-UNIT TABS Take 1  tablet by mouth 2 (two) times daily.     cetirizine  (ZYRTEC ) 10 MG tablet Take 1 tablet (10 mg total) by mouth daily. 30 tablet 0   Cholecalciferol (D3 ADULT PO) Take 1 capsule by mouth daily.     clotrimazole-betamethasone (LOTRISONE) cream Apply topically 4 (four) times daily as needed.     desloratadine (CLARINEX) 5 MG tablet Take 5 mg by mouth daily. (Patient not taking: Reported on 06/29/2024)     dexamethasone  (DECADRON ) 4 MG tablet  Take 2 tablets (8 mg total) by mouth daily for 3 days. Start the day after chemotherapy. Take with food. 30 tablet 1   docusate sodium (COLACE) 50 MG capsule Take 50 mg by mouth 2 (two) times daily. (Patient not taking: Reported on 06/29/2024)     empagliflozin (JARDIANCE) 25 MG TABS tablet Take 12.5 mg by mouth daily.     EPINEPHrine  0.3 mg/0.3 mL IJ SOAJ injection Use as directed for severe allergic reaction. 2 Device 1   etanercept (ENBREL) 25 MG injection Inject 25 mg into the skin.     fluconazole  (DIFLUCAN ) 150 MG tablet Take 1 tablet (150 mg total) by mouth daily. 1 tablet 0   fluticasone  (FLONASE ) 50 MCG/ACT nasal spray Place 1 spray into both nostrils 2 (two) times daily. 16 g 0   fluticasone -salmeterol (ADVAIR) 250-50 MCG/ACT AEPB Inhale 1 puff into the lungs in the morning and at bedtime.     furosemide (LASIX) 10 MG/ML solution Take by mouth daily.     gabapentin  (NEURONTIN ) 300 MG capsule Take 1 capsule (300 mg total) by mouth 3 (three) times daily. (Patient not taking: Reported on 06/29/2024) 90 capsule 1   hydrALAZINE (APRESOLINE) 25 MG tablet Take 25 mg by mouth 2 (two) times daily.     ipratropium (ATROVENT ) 0.02 % nebulizer solution Take 0.5 mg by nebulization every 4 (four) hours as needed.      leflunomide (ARAVA) 20 MG tablet Take 20 mg by mouth daily.     leflunomide (ARAVA) 20 MG tablet Take 1 tablet by mouth daily. (Patient not taking: Reported on 06/29/2024)     levothyroxine (SYNTHROID) 75 MCG tablet Take 75 mcg by mouth daily before breakfast.      lisinopril (PRINIVIL,ZESTRIL) 40 MG tablet Take 20 mg by mouth in the morning and at bedtime.      metoprolol succinate (TOPROL-XL) 50 MG 24 hr tablet Take 50 mg by mouth daily. Take with or immediately following a meal.     montelukast  (SINGULAIR ) 10 MG tablet Take 10 mg by mouth at bedtime.     nitroGLYCERIN (NITROSTAT) 0.4 MG SL tablet Place under the tongue. Place 1 tablet (0.4 mg total) under the tongue every 5 (five) minutes as needed for Chest pain May take up to 3 doses.     nortriptyline (PAMELOR) 25 MG capsule Take 25 mg by mouth at bedtime.     olopatadine  (PATANOL) 0.1 % ophthalmic solution Place 1 drop into both eyes 2 (two) times daily. 5 mL 5   omeprazole (PRILOSEC) 10 MG capsule Take 20 mg by mouth daily.     ondansetron  (ZOFRAN ) 8 MG tablet Take 1 tablet (8 mg total) by mouth every 8 (eight) hours as needed for nausea or vomiting. Start on the third day after chemotherapy. 30 tablet 1   ondansetron  (ZOFRAN -ODT) 4 MG disintegrating tablet Take 1 tablet (4 mg total) by mouth every 8 (eight) hours as needed for nausea or vomiting. 15 tablet 0   potassium chloride  SA (KLOR-CON  M) 20 MEQ tablet Take 1 tablet (20 mEq total) by mouth daily. 30 tablet 0   pravastatin (PRAVACHOL) 40 MG tablet Take 40 mg by mouth daily.     predniSONE  (DELTASONE ) 5 MG tablet Take 5 mg by mouth daily with breakfast.     predniSONE  (DELTASONE ) 50 MG tablet Take 1 tablet (50 mg total) by mouth See admin instructions. Prednisone  - take 50 mg by mouth at 13 hours, 7 hours, and 1 hour before contrast media  injection 3 tablet 0   Prenatal Vit-Fe Fumarate-FA (MULTIVITAMIN-PRENATAL) 27-0.8 MG TABS tablet Take 1 tablet by mouth daily.      prochlorperazine  (COMPAZINE ) 10 MG tablet Take 1 tablet (10 mg total) by mouth every 6 (six) hours as needed for nausea or vomiting. 30 tablet 1   senna-docusate (SENOKOT-S) 8.6-50 MG tablet Take 1 tablet by mouth at bedtime as needed for mild constipation. (Patient not taking:  Reported on 06/29/2024)     sulfaSALAzine (AZULFIDINE) 500 MG tablet TAKE ONE TABLET BY MOUTH TWO TIMES A DAY FOR 14 DAYS, THEN TAKE TWO TABLETS TWO TIMES A DAY FOR 14 DAYS, THEN TAKE THREE TABLETS TWO TIMES A DAY FOR 62 DAYS FOR INFLAMMATION. TAKE AFTER MEALS     tiotropium (SPIRIVA) 18 MCG inhalation capsule Place 18 mcg into inhaler and inhale daily. (Patient not taking: Reported on 06/29/2024)     verapamil (CALAN) 120 MG tablet Take 240 mg by mouth 2 (two) times daily.     No current facility-administered medications for this visit.   Facility-Administered Medications Ordered in Other Visits  Medication Dose Route Frequency Provider Last Rate Last Admin   0.9 %  sodium chloride  infusion   Intravenous Continuous Babara Call, MD 10 mL/hr at 07/08/24 0950 New Bag at 07/08/24 0950   CARBOplatin  (PARAPLATIN ) 430 mg in sodium chloride  0.9 % 250 mL chemo infusion  430 mg Intravenous Once Rodolfo Gaster, MD       PACLitaxel  (TAXOL ) 270 mg in sodium chloride  0.9 % 250 mL chemo infusion (> 80mg /m2)  135 mg/m2 (Treatment Plan Recorded) Intravenous Once Babara Call, MD 98 mL/hr at 07/08/24 1103 270 mg at 07/08/24 1103     PHYSICAL EXAMINATION: ECOG PERFORMANCE STATUS: 1 - Symptomatic but completely ambulatory  Physical Exam Constitutional:      General: She is not in acute distress.    Appearance: She is obese.  HENT:     Head: Normocephalic and atraumatic.  Eyes:     General: No scleral icterus. Cardiovascular:     Rate and Rhythm: Normal rate and regular rhythm.     Heart sounds: Normal heart sounds.  Pulmonary:     Effort: Pulmonary effort is normal. No respiratory distress.     Breath sounds: Normal breath sounds. No wheezing.  Abdominal:     General: Bowel sounds are normal. There is no distension.     Palpations: Abdomen is soft.  Musculoskeletal:        General: Deformity present. Normal range of motion.     Cervical back: Normal range of motion and neck supple.  Skin:    General: Skin is warm  and dry.     Findings: No erythema or rash.  Neurological:     Mental Status: She is alert and oriented to person, place, and time. Mental status is at baseline.  Psychiatric:        Mood and Affect: Mood normal.     LABORATORY DATA:  I have reviewed the data as listed    Latest Ref Rng & Units 07/08/2024    8:50 AM 06/21/2024    9:58 AM 03/07/2024    1:42 PM  CBC  WBC 4.0 - 10.5 K/uL 7.3  8.2  11.1   Hemoglobin 12.0 - 15.0 g/dL 87.2  87.1  88.3   Hematocrit 36.0 - 46.0 % 42.8  42.1  38.6   Platelets 150 - 400 K/uL 285  256  278       Latest Ref Rng &  Units 07/08/2024    8:50 AM 06/30/2024    1:45 PM 03/07/2024    1:42 PM  CMP  Glucose 70 - 99 mg/dL 839  860  866   BUN 8 - 23 mg/dL 18  15  16    Creatinine 0.44 - 1.00 mg/dL 8.90  9.11  9.12   Sodium 135 - 145 mmol/L 137  138  138   Potassium 3.5 - 5.1 mmol/L 3.9  2.9  3.2   Chloride 98 - 111 mmol/L 102  102  104   CO2 22 - 32 mmol/L 24  27  26    Calcium 8.9 - 10.3 mg/dL 9.0  9.0  9.0   Total Protein 6.5 - 8.1 g/dL 7.4  7.6  7.5   Total Bilirubin 0.0 - 1.2 mg/dL 0.8  0.7  0.6   Alkaline Phos 38 - 126 U/L 64  64  61   AST 15 - 41 U/L 20  21  15    ALT 0 - 44 U/L 15  14  14       Iron /TIBC/Ferritin/ %Sat    Component Value Date/Time   IRON  43 01/02/2021 1331   TIBC 200 (L) 01/02/2021 1331   FERRITIN 759 (H) 01/02/2021 1331   IRONPCTSAT 22 01/02/2021 1331      RADIOGRAPHIC STUDIES: I have personally reviewed the radiological images as listed and agreed with the findings in the report., CT ABDOMINAL MASS BIOPSY Result Date: 06/21/2024 INDICATION: Retroperitoneal mass EXAM: CT-guided biopsy TECHNIQUE: Multidetector CT imaging of the abdomen was performed following the standard protocol with/without IV contrast. RADIATION DOSE REDUCTION: This exam was performed according to the departmental dose-optimization program which includes automated exposure control, adjustment of the mA and/or kV according to patient size and/or use of  iterative reconstruction technique. MEDICATIONS: None. ANESTHESIA/SEDATION: Moderate (conscious) sedation was employed during this procedure. A total of Versed  2 mg and Fentanyl  100 mcg was administered intravenously by the radiology nurse. Total intra-service moderate Sedation Time: 35 minutes. The patient's level of consciousness and vital signs were monitored continuously by radiology nursing throughout the procedure under my direct supervision. COMPLICATIONS: None immediate. PROCEDURE: Informed written consent was obtained from the patient after a thorough discussion of the procedural risks, benefits and alternatives. All questions were addressed. Maximal Sterile Barrier Technique was utilized including caps, mask, sterile gowns, sterile gloves, sterile drape, hand hygiene and skin antiseptic. A timeout was performed prior to the initiation of the procedure. With patient in a prone position and markers on the the rectal lumbar spine, initial axial images were obtained. The retroperitoneal Peri aortic mass is again identified. The patient's skin was prepped and draped in the usual sterile fashion. Local Anesthesia was achieved by infiltrating subcutaneous tissue with 1% lidocaine  from the skin to the paraspinous muscles. A small incision was then made. He an introducer needle was then advanced in sequential fashion with intermittent imaging until the needle tip was identified at the retroperitoneal lymph node. The needle was removed leaving the cannula behind and a BioPince needle was then advanced and multiple samples were obtained and placed in formalin. Final images demonstrate no active hemorrhage. The cannula and all needles removed from the patient. Sterile dressing applied. IMPRESSION: Satisfactory CT-guided core needle biopsy of a retroperitoneal left-sided periaortic lymph node. Electronically Signed   By: Cordella Banner   On: 06/21/2024 13:33   CT CHEST ABDOMEN PELVIS W CONTRAST Result Date:  06/09/2024 CLINICAL DATA:  Endometrial cancer restaging * Tracking Code: BO * EXAM: CT  CHEST, ABDOMEN, AND PELVIS WITH CONTRAST TECHNIQUE: Multidetector CT imaging of the chest, abdomen and pelvis was performed following the standard protocol during bolus administration of intravenous contrast. RADIATION DOSE REDUCTION: This exam was performed according to the departmental dose-optimization program which includes automated exposure control, adjustment of the mA and/or kV according to patient size and/or use of iterative reconstruction technique. CONTRAST:  OMNIPAQUE  IOHEXOL  300 MG/ML  SOLN COMPARISON:  04/29/2023 FINDINGS: CT CHEST FINDINGS Cardiovascular: Aortic atherosclerosis. Normal heart size. No pericardial effusion. Mediastinum/Nodes: No enlarged mediastinal, hilar, or axillary lymph nodes. Thyroid  gland, trachea, and esophagus demonstrate no significant findings. Lungs/Pleura: Mild diffuse bilateral bronchial wall thickening. Occasional tiny pulmonary nodules unchanged, for example in the dependent right lower lobe measuring 0.3 cm (series 4, image 76) and in the right apex measuring 0.2 cm (series 4, image 19). No pleural effusion or pneumothorax. Musculoskeletal: No chest wall abnormality. No acute osseous findings. CT ABDOMEN PELVIS FINDINGS Hepatobiliary: No focal liver abnormality is seen. Status post cholecystectomy. No biliary dilatation. Pancreas: Unremarkable. No pancreatic ductal dilatation or surrounding inflammatory changes. Spleen: Normal in size without significant abnormality. Adrenals/Urinary Tract: Adrenal glands are unremarkable. Small nonobstructive calculus of the inferior pole of the left kidney. No right-sided calculi, ureteral calculi, or hydronephrosis. Bladder is unremarkable. Stomach/Bowel: Stomach is within normal limits. Appendix appears normal. No evidence of bowel wall thickening, distention, or inflammatory changes. Descending colonic diverticulosis. Vascular/Lymphatic:  Aortic atherosclerosis. Newly enlarged left retroperitoneal lymph nodes measuring up to 2.5 x 2.2 cm (series 2, image 61). Reproductive: Status post hysterectomy. Other: Small fat containing midline ventral hernia (series 2, image 81). No ascites. Musculoskeletal: No acute osseous findings. IMPRESSION: 1. Newly enlarged left retroperitoneal lymph nodes measuring up to 2.5 x 2.2 cm, consistent with nodal metastatic disease. 2. Occasional tiny pulmonary nodules unchanged, most likely benign and incidental. 3. Status post hysterectomy. 4. Nonobstructive left nephrolithiasis. Aortic Atherosclerosis (ICD10-I70.0). Electronically Signed   By: Marolyn JONETTA Jaksch M.D.   On: 06/09/2024 22:01

## 2024-07-08 NOTE — Assessment & Plan Note (Signed)
 Stable on recent CT scans.

## 2024-07-08 NOTE — Assessment & Plan Note (Signed)
#  Neuropathy due to chemotherapy.   Overall improved. She has self discontinued gabapentin , manageable symptoms.

## 2024-07-08 NOTE — Assessment & Plan Note (Signed)
follow-up rheumatologist. - off Prednisone, now on Enbrel.

## 2024-07-08 NOTE — Assessment & Plan Note (Addendum)
 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 Patient with developed recurrent disease, retroperitoneal mass-> biopsy proven recurrence.pending MMR status, HER2  Labs are reviewed and discussed with patient.  Proceed with carboplatin  AUC 4.5 Taxol  135mg /m2 - [dosage she was able to tolerate previously]

## 2024-07-12 ENCOUNTER — Telehealth: Payer: Self-pay | Admitting: *Deleted

## 2024-07-12 ENCOUNTER — Other Ambulatory Visit: Payer: Self-pay | Admitting: Oncology

## 2024-07-12 MED ORDER — GABAPENTIN 300 MG PO CAPS: 300.0000 mg | ORAL_CAPSULE | Freq: Two times a day (BID) | ORAL | 0 refills | Status: AC

## 2024-07-12 NOTE — Telephone Encounter (Signed)
 Per Dr. Babara: rx for gabapentin  300mg  sent to pharmacy. Pt to take 300mg  daily for 2 -3 days and then increase to 300mg  BID.  Pt informed verbalized understanding.

## 2024-07-12 NOTE — Telephone Encounter (Signed)
 Patient wants MD to send her something to help with her toes and the bottom of her feet it is painful.

## 2024-07-14 ENCOUNTER — Telehealth: Payer: Self-pay | Admitting: *Deleted

## 2024-07-14 ENCOUNTER — Telehealth: Payer: Self-pay | Admitting: Oncology

## 2024-07-14 NOTE — Telephone Encounter (Signed)
 The patient called today and said that yesterday she feels like sandpaper bumps in her mouth she says she looked at it and it looks like white little bumps and it is sore, also she says her stomach has been hurting and last night she had she but she bowel movement and the patient said it was so terrible smelling that she did not even want to be in the bathroom.  She would like to know something to help her mouth and what is going on with her bowels

## 2024-07-14 NOTE — Telephone Encounter (Signed)
 Sandra Fischer called and needed 6 wks of her records, I gave her the phone number to [facility] to request her personal records.

## 2024-07-19 ENCOUNTER — Other Ambulatory Visit: Payer: Self-pay | Admitting: Oncology

## 2024-07-19 ENCOUNTER — Encounter: Payer: Self-pay | Admitting: Hospice and Palliative Medicine

## 2024-07-19 ENCOUNTER — Ambulatory Visit

## 2024-07-19 ENCOUNTER — Other Ambulatory Visit: Payer: Self-pay

## 2024-07-19 ENCOUNTER — Telehealth: Payer: Self-pay | Admitting: *Deleted

## 2024-07-19 ENCOUNTER — Telehealth: Payer: Self-pay

## 2024-07-19 ENCOUNTER — Inpatient Hospital Stay (HOSPITAL_BASED_OUTPATIENT_CLINIC_OR_DEPARTMENT_OTHER): Admitting: Hospice and Palliative Medicine

## 2024-07-19 ENCOUNTER — Inpatient Hospital Stay

## 2024-07-19 VITALS — BP 113/59 | HR 98 | Temp 97.8°F | Resp 17 | Wt 196.2 lb

## 2024-07-19 DIAGNOSIS — R197 Diarrhea, unspecified: Secondary | ICD-10-CM | POA: Diagnosis not present

## 2024-07-19 DIAGNOSIS — D709 Neutropenia, unspecified: Secondary | ICD-10-CM | POA: Insufficient documentation

## 2024-07-19 DIAGNOSIS — D702 Other drug-induced agranulocytosis: Secondary | ICD-10-CM

## 2024-07-19 DIAGNOSIS — C541 Malignant neoplasm of endometrium: Secondary | ICD-10-CM | POA: Diagnosis not present

## 2024-07-19 DIAGNOSIS — Z5111 Encounter for antineoplastic chemotherapy: Secondary | ICD-10-CM | POA: Diagnosis not present

## 2024-07-19 DIAGNOSIS — D509 Iron deficiency anemia, unspecified: Secondary | ICD-10-CM

## 2024-07-19 LAB — GASTROINTESTINAL PANEL BY PCR, STOOL (REPLACES STOOL CULTURE)

## 2024-07-19 LAB — CBC WITH DIFFERENTIAL (CANCER CENTER ONLY)
Abs Immature Granulocytes: 0.02 K/uL (ref 0.00–0.07)
Basophils Absolute: 0 K/uL (ref 0.0–0.1)
Basophils Relative: 1 %
Eosinophils Absolute: 0.1 K/uL (ref 0.0–0.5)
Eosinophils Relative: 4 %
HCT: 35.5 % — ABNORMAL LOW (ref 36.0–46.0)
Hemoglobin: 11 g/dL — ABNORMAL LOW (ref 12.0–15.0)
Immature Granulocytes: 1 %
Lymphocytes Relative: 64 %
Lymphs Abs: 2 K/uL (ref 0.7–4.0)
MCH: 26.3 pg (ref 26.0–34.0)
MCHC: 31 g/dL (ref 30.0–36.0)
MCV: 84.9 fL (ref 80.0–100.0)
Monocytes Absolute: 0.7 K/uL (ref 0.1–1.0)
Monocytes Relative: 20 %
Neutro Abs: 0.3 K/uL — CL (ref 1.7–7.7)
Neutrophils Relative %: 10 %
Platelet Count: 144 K/uL — ABNORMAL LOW (ref 150–400)
RBC: 4.18 MIL/uL (ref 3.87–5.11)
RDW: 15.6 % — ABNORMAL HIGH (ref 11.5–15.5)
WBC Count: 3.2 K/uL — ABNORMAL LOW (ref 4.0–10.5)
nRBC: 0 % (ref 0.0–0.2)

## 2024-07-19 LAB — CMP (CANCER CENTER ONLY)
ALT: 22 U/L (ref 0–44)
AST: 23 U/L (ref 15–41)
Albumin: 3.6 g/dL (ref 3.5–5.0)
Alkaline Phosphatase: 55 U/L (ref 38–126)
Anion gap: 9 (ref 5–15)
BUN: 6 mg/dL — ABNORMAL LOW (ref 8–23)
CO2: 24 mmol/L (ref 22–32)
Calcium: 8.5 mg/dL — ABNORMAL LOW (ref 8.9–10.3)
Chloride: 99 mmol/L (ref 98–111)
Creatinine: 0.89 mg/dL (ref 0.44–1.00)
GFR, Estimated: >60 mL/min (ref >60)
Glucose, Bld: 128 mg/dL — ABNORMAL HIGH (ref 70–99)
Potassium: 3.6 mmol/L (ref 3.5–5.1)
Sodium: 132 mmol/L — ABNORMAL LOW (ref 135–145)
Total Bilirubin: 0.9 mg/dL (ref 0.0–1.2)
Total Protein: 7 g/dL (ref 6.5–8.1)

## 2024-07-19 LAB — CLOSTRIDIUM DIFFICILE BY PCR, REFLEXED: Toxigenic C. Difficile by PCR: POSITIVE — AB

## 2024-07-19 LAB — C DIFFICILE QUICK SCREEN W PCR REFLEX
C Diff antigen: POSITIVE — AB
C Diff toxin: NEGATIVE

## 2024-07-19 LAB — MAGNESIUM: Magnesium: 1.9 mg/dL (ref 1.7–2.4)

## 2024-07-19 MED ORDER — SODIUM CHLORIDE 0.9 % IV SOLN
INTRAVENOUS | Status: DC
  Filled 2024-07-19 (×2): qty 250

## 2024-07-19 MED ORDER — FILGRASTIM-SNDZ 480 MCG/0.8ML IJ SOSY
480.0000 ug | PREFILLED_SYRINGE | Freq: Once | INTRAMUSCULAR | Status: AC
  Administered 2024-07-19: 480 ug via SUBCUTANEOUS
  Filled 2024-07-19: qty 0.8

## 2024-07-19 MED ORDER — LEVOFLOXACIN 500 MG PO TABS: 500.0000 mg | ORAL_TABLET | Freq: Every day | ORAL | 0 refills | Status: DC

## 2024-07-19 NOTE — Progress Notes (Signed)
 Symptom Management Clinic [facility] at [facility] Telephone:([phone]) [phone] [phone]) [phone]  Patient Care Team: Center, [facility] as PCP - General (General Practice) Maurie Rayfield BIRCH, RN as Oncology Nurse Navigator Lenn Aran, MD as Radiation Oncologist (Radiation Oncology) Babara Call, MD as Consulting Physician (Oncology)   NAME OF PATIENT: Sandra Fischer  [id number]  [birth year]   DATE OF VISIT: 07/19/24  REASON FOR CONSULT: Gracielynn Birkel is a 73 y.o. female with multiple medical problems including recurrent carcinoma of the endometrium.   INTERVAL HISTORY: Patient received day 1, cycle 1 CarboTaxol on 07/08/2024.  She presents [facility] today for evaluation of diarrhea.  Patient says that she has had 2 weeks of persistent diarrhea since she received chemotherapy.  She is unsure of how many loose stools she has daily.  Denies fever or chills.  States that she has poor oral intake.  Feels dehydrated reports that she is having dizziness sometimes when she stands.  Has occasional nausea but no vomiting.  Patient has not tried antidiarrheals.  Denies any neurologic complaints. Denies recent fevers or illnesses. Denies any easy bleeding or bruising.  Denies chest pain.  Denies urinary complaints. Patient offers no further specific complaints today.   PAST MEDICAL HISTORY: Past Medical History:  Diagnosis Date   Asthma    Asthma    Asthma exacerbation 08/22/2016   Cancer (HCC)    Collagen vascular disease (HCC)    Diabetes mellitus without complication (HCC)    History of Diabetes    Dyspnea    Environmental allergies    Fibromyalgia    High cholesterol    Hypertension    Hypothyroidism    Iron  deficiency anemia 07/03/2020   Neuropathy    RA (rheumatoid arthritis) (HCC)    Sleep apnea    Thyroid  disease     PAST SURGICAL HISTORY:  Past Surgical History:  Procedure Laterality Date   ABDOMINAL HYSTERECTOMY     CATARACT EXTRACTION      CHOLECYSTECTOMY     COLONOSCOPY WITH PROPOFOL  N/A 03/14/2022   Procedure: COLONOSCOPY WITH PROPOFOL ;  Surgeon: Maryruth Ole DASEN, MD;  Location: [facility] ENDOSCOPY;  Service: Endoscopy;  Laterality: N/A;  DM   CYSTOURETHROSCOPY     DIAGNOSTIC LAPAROSCOPY     EYE SURGERY     fibroids removed     PORTA CATH REMOVAL N/A 02/14/2021   Procedure: PORTA CATH REMOVAL;  Surgeon: Marea Selinda RAMAN, MD;  Location: [facility] INVASIVE CV LAB;  Service: Cardiovascular;  Laterality: N/A;   THYROIDECTOMY, PARTIAL     tummy tuck      HEMATOLOGY/ONCOLOGY HISTORY:  Oncology History  Recurrent carcinoma of endometrium (HCC)  07/15/2019 Initial Diagnosis   Endometrial cancer  Patient had TH/BSO sentinel lymph node biopsy by [facility] oncology Dr. Augustin on 06/21/2019. Extensive medical records from [facility] health system review was performed by me.  Tumor size 9.3 cm, invading 99% of myometrium [3.55 out of 3.6 cm], positive right external iliac sentinel lymph nodes.  Negative washing.  No LVI, serosa is uninvolved. 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 [id number]  FIGO stage IIIC1 MSI intact HER 2 negative.   her case was discussed at [facility] tumor board on 07/04/2019  PORTEC-3 regimen was recommended given subgroup analysis showing benefit in the serous histology  Patient declined treatment.     10/20/2019 Relapse/Recurrence   CT chest abdomen pelvis with contrast showed disease recurrence. There are multiple newly enlarged retroperitoneal and right iliac lymph nodes  the largest the left retroperitoneal node measuring 1.9 x 1.6 cm concerning for nodal metastasis disease. Unchanged 4 mm groundglass pulmonary nodule of the left pulmonary apex. This remains nonspecific.    11/07/2019 - 02/20/2020 Chemotherapy   carboplatin  AUC 4.5 and paclitaxel  175 mg/m2 x 2 cycles carboplatin  AUC4.5 and paclitaxel  135 mg/m2 x 4 cycles   03/12/2020 Imaging   CT chest abdomen pelvis with  contrast showed 1. The multiple enlarged retroperitoneal and right iliac lymph nodes identified as new on the previous exam from 10/20/2019 have resolved completely in the interval. 2. Tiny ground-glass pulmonary nodule medial left lung apex is stable in the interval. Likely benign, continued attention on follow-up recommended. 3. No new or progressive interval findings. 4.  Aortic Atherosclerois (ICD10-170.0   06/07/2024 Imaging   CT chest abdomen pelvis with contrast showed 1. Newly enlarged left retroperitoneal lymph nodes measuring up to 2.5 x 2.2 cm, consistent with nodal metastatic disease. 2. Occasional tiny pulmonary nodules unchanged, most likely benign and incidental. 3. Status post hysterectomy. 4. Nonobstructive left nephrolithiasis.   06/21/2024 Relapse/Recurrence   Periaortic left side soft tissue mass biopsy showed metastatic poorly differentiated carcinoma.    Diagnosis Note : The specimen demonstrates a pleomorphic high-grade epithelioid proliferation. Immunohistochemical stains were performed to characterize the tumor cells. The cells are positive for CK AE1/AE3, GATA3, CDX2, and p40. P53 is mutant type of staining. The cells are negative for PAX8, TTF-1, WT1, ER, -PR, S100 and SOX10. The immunoprofile is not specific and is not indicative of a specific site of primary. The patient's history of endometrial carcinoma is notes. The tumor most likely represents a metastasis from the patient's known endometrial carcinoma since metastatic tumor cells could aberrant loss or gain expression of lineage markers. However, metastasis from other organ system cannot be entirely excluded. Correlation with imaging findings is recommended. Controls worked appropriately.    07/08/2024 -  Chemotherapy   Patient is on Treatment Plan : UTERINE Carboplatin  + Paclitaxel  q21d       ALLERGIES:  is allergic to abatacept, codeine, iodine, latex, shellfish allergy, trimethoprim  hydrochloride  [trimethoprim ], and penicillins.  MEDICATIONS:  Current Outpatient Medications  Medication Sig Dispense Refill   Acetaminophen  (TYLENOL  EXTRA STRENGTH PO) Take 1,300 mg by mouth 2 (two) times daily as needed.     ascorbic acid (VITAMIN C) 500 MG tablet Take 500 mg by mouth daily.     aspirin  EC 81 MG tablet Take 81 mg by mouth daily.     azithromycin  (ZITHROMAX  Z-PAK) 250 MG tablet Take 2 tablets (500 mg) on  Day 1,  followed by 1 tablet (250 mg) once daily on Days 2 through 5. 6 each 0   Calcium Carbonate-Vit D-Min (CALCIUM 600+D PLUS MINERALS) 600-400 MG-UNIT TABS Take 1 tablet by mouth 2 (two) times daily.     cetirizine  (ZYRTEC ) 10 MG tablet Take 1 tablet (10 mg total) by mouth daily. 30 tablet 0   Cholecalciferol (D3 ADULT PO) Take 1 capsule by mouth daily.     clotrimazole-betamethasone (LOTRISONE) cream Apply topically 4 (four) times daily as needed.     dexamethasone  (DECADRON ) 4 MG tablet Take 2 tablets (8 mg total) by mouth daily for 3 days. Start the day after chemotherapy. Take with food. 30 tablet 1   empagliflozin (JARDIANCE) 25 MG TABS tablet Take 12.5 mg by mouth daily.     EPINEPHrine  0.3 mg/0.3 mL IJ SOAJ injection Use as directed for severe allergic reaction. 2 Device 1   etanercept (ENBREL)  25 MG injection Inject 25 mg into the skin.     fluconazole  (DIFLUCAN ) 150 MG tablet Take 1 tablet (150 mg total) by mouth daily. 1 tablet 0   fluticasone  (FLONASE ) 50 MCG/ACT nasal spray Place 1 spray into both nostrils 2 (two) times daily. 16 g 0   fluticasone -salmeterol (ADVAIR) 250-50 MCG/ACT AEPB Inhale 1 puff into the lungs in the morning and at bedtime.     furosemide (LASIX) 10 MG/ML solution Take by mouth daily.     gabapentin  (NEURONTIN ) 300 MG capsule Take 1 capsule (300 mg total) by mouth 2 (two) times daily. 60 capsule 0   hydrALAZINE (APRESOLINE) 25 MG tablet Take 25 mg by mouth 2 (two) times daily.     ipratropium (ATROVENT ) 0.02 % nebulizer solution Take 0.5 mg by  nebulization every 4 (four) hours as needed.      leflunomide (ARAVA) 20 MG tablet Take 20 mg by mouth daily.     levothyroxine (SYNTHROID) 75 MCG tablet Take 75 mcg by mouth daily before breakfast.     lisinopril (PRINIVIL,ZESTRIL) 40 MG tablet Take 20 mg by mouth in the morning and at bedtime.      metoprolol succinate (TOPROL-XL) 50 MG 24 hr tablet Take 50 mg by mouth daily. Take with or immediately following a meal.     montelukast  (SINGULAIR ) 10 MG tablet Take 10 mg by mouth at bedtime.     nitroGLYCERIN (NITROSTAT) 0.4 MG SL tablet Place under the tongue. Place 1 tablet (0.4 mg total) under the tongue every 5 (five) minutes as needed for Chest pain May take up to 3 doses.     nortriptyline (PAMELOR) 25 MG capsule Take 25 mg by mouth at bedtime.     olopatadine  (PATANOL) 0.1 % ophthalmic solution Place 1 drop into both eyes 2 (two) times daily. 5 mL 5   omeprazole (PRILOSEC) 10 MG capsule Take 20 mg by mouth daily.     ondansetron  (ZOFRAN ) 8 MG tablet Take 1 tablet (8 mg total) by mouth every 8 (eight) hours as needed for nausea or vomiting. Start on the third day after chemotherapy. 30 tablet 1   ondansetron  (ZOFRAN -ODT) 4 MG disintegrating tablet Take 1 tablet (4 mg total) by mouth every 8 (eight) hours as needed for nausea or vomiting. 15 tablet 0   potassium chloride  SA (KLOR-CON  M) 20 MEQ tablet Take 1 tablet (20 mEq total) by mouth daily. 30 tablet 0   pravastatin (PRAVACHOL) 40 MG tablet Take 40 mg by mouth daily.     predniSONE  (DELTASONE ) 5 MG tablet Take 5 mg by mouth daily with breakfast.     predniSONE  (DELTASONE ) 50 MG tablet Take 1 tablet (50 mg total) by mouth See admin instructions. Prednisone  - take 50 mg by mouth at 13 hours, 7 hours, and 1 hour before contrast media injection 3 tablet 0   Prenatal Vit-Fe Fumarate-FA (MULTIVITAMIN-PRENATAL) 27-0.8 MG TABS tablet Take 1 tablet by mouth daily.      prochlorperazine  (COMPAZINE ) 10 MG tablet Take 1 tablet (10 mg total) by mouth  every 6 (six) hours as needed for nausea or vomiting. 30 tablet 1   sulfaSALAzine (AZULFIDINE) 500 MG tablet TAKE ONE TABLET BY MOUTH TWO TIMES A DAY FOR 14 DAYS, THEN TAKE TWO TABLETS TWO TIMES A DAY FOR 14 DAYS, THEN TAKE THREE TABLETS TWO TIMES A DAY FOR 62 DAYS FOR INFLAMMATION. TAKE AFTER MEALS     verapamil (CALAN) 120 MG tablet Take 240 mg by mouth 2 (two)  times daily.     azelastine  (ASTELIN ) 0.1 % nasal spray Place 2 sprays into both nostrils 2 (two) times daily. (Patient not taking: Reported on 07/19/2024) 30 mL 5   desloratadine (CLARINEX) 5 MG tablet Take 5 mg by mouth daily. (Patient not taking: Reported on 07/19/2024)     docusate sodium (COLACE) 50 MG capsule Take 50 mg by mouth 2 (two) times daily. (Patient not taking: Reported on 07/19/2024)     leflunomide (ARAVA) 20 MG tablet Take 1 tablet by mouth daily. (Patient not taking: Reported on 07/19/2024)     senna-docusate (SENOKOT-S) 8.6-50 MG tablet Take 1 tablet by mouth at bedtime as needed for mild constipation. (Patient not taking: Reported on 07/19/2024)     tiotropium (SPIRIVA) 18 MCG inhalation capsule Place 18 mcg into inhaler and inhale daily. (Patient not taking: Reported on 07/19/2024)     No current facility-administered medications for this visit.    VITAL SIGNS: BP (!) 113/59   Pulse 98   Temp 97.8 F (36.6 C)   Resp 17   Wt 196 lb 3.2 oz (89 kg)   SpO2 94%   BMI 35.89 kg/m  Filed Weights   07/19/24 1001  Weight: 196 lb 3.2 oz (89 kg)    Estimated body mass index is 35.89 kg/m as calculated from the following:   Height as of 06/29/24: 5' 2 (1.575 m).   Weight as of this encounter: 196 lb 3.2 oz (89 kg).  LABS: CBC:    Component Value Date/Time   WBC 7.3 07/08/2024 0850   WBC 8.2 06/21/2024 0958   HGB 12.7 07/08/2024 0850   HCT 42.8 07/08/2024 0850   PLT 285 07/08/2024 0850   MCV 88.6 07/08/2024 0850   NEUTROABS 2.6 07/08/2024 0850   LYMPHSABS 3.6 07/08/2024 0850   MONOABS 0.6 07/08/2024 0850    EOSABS 0.3 07/08/2024 0850   BASOSABS 0.1 07/08/2024 0850   Comprehensive Metabolic Panel:    Component Value Date/Time   NA 137 07/08/2024 0850   K 3.9 07/08/2024 0850   CL 102 07/08/2024 0850   CO2 24 07/08/2024 0850   BUN 18 07/08/2024 0850   CREATININE 1.09 (H) 07/08/2024 0850   GLUCOSE 160 (H) 07/08/2024 0850   CALCIUM 9.0 07/08/2024 0850   AST 20 07/08/2024 0850   ALT 15 07/08/2024 0850   ALKPHOS 64 07/08/2024 0850   BILITOT 0.8 07/08/2024 0850   PROT 7.4 07/08/2024 0850   ALBUMIN 3.8 07/08/2024 0850    RADIOGRAPHIC STUDIES: CT ABDOMINAL MASS BIOPSY Result Date: 06/21/2024 INDICATION: Retroperitoneal mass EXAM: CT-guided biopsy TECHNIQUE: Multidetector CT imaging of the abdomen was performed following the standard protocol with/without IV contrast. RADIATION DOSE REDUCTION: This exam was performed according to the departmental dose-optimization program which includes automated exposure control, adjustment of the mA and/or kV according to patient size and/or use of iterative reconstruction technique. MEDICATIONS: None. ANESTHESIA/SEDATION: Moderate (conscious) sedation was employed during this procedure. A total of Versed  2 mg and Fentanyl  100 mcg was administered intravenously by the radiology nurse. Total intra-service moderate Sedation Time: 35 minutes. The patient's level of consciousness and vital signs were monitored continuously by radiology nursing throughout the procedure under my direct supervision. COMPLICATIONS: None immediate. PROCEDURE: Informed written consent was obtained from the patient after a thorough discussion of the procedural risks, benefits and alternatives. All questions were addressed. Maximal Sterile Barrier Technique was utilized including caps, mask, sterile gowns, sterile gloves, sterile drape, hand hygiene and skin antiseptic. A timeout was performed  prior to the initiation of the procedure. With patient in a prone position and markers on the the rectal  lumbar spine, initial axial images were obtained. The retroperitoneal Peri aortic mass is again identified. The patient's skin was prepped and draped in the usual sterile fashion. Local Anesthesia was achieved by infiltrating subcutaneous tissue with 1% lidocaine  from the skin to the paraspinous muscles. A small incision was then made. He an introducer needle was then advanced in sequential fashion with intermittent imaging until the needle tip was identified at the retroperitoneal lymph node. The needle was removed leaving the cannula behind and a BioPince needle was then advanced and multiple samples were obtained and placed in formalin. Final images demonstrate no active hemorrhage. The cannula and all needles removed from the patient. Sterile dressing applied. IMPRESSION: Satisfactory CT-guided core needle biopsy of a retroperitoneal left-sided periaortic lymph node. Electronically Signed   By: Cordella Banner   On: 06/21/2024 13:33    PERFORMANCE STATUS (ECOG) : 1 - Symptomatic but completely ambulatory  Review of Systems Unless otherwise noted, a complete review of systems is negative.  Physical Exam General: NAD Cardiovascular: regular rate and rhythm Pulmonary: clear ant fields Abdomen: soft, nontender, + bowel sounds GU: no suprapubic tenderness Extremities: no edema, no joint deformities Skin: no rashes Neurological: Weakness but otherwise nonfocal  IMPRESSION/PLAN: Recurrent endometrial cancer -status post cycle 1 CarboTaxol  Neutropenia -secondary to chemotherapy.  Discussed with Dr. Babara who is ordering G-CSF daily x 3.  Will start patient on Levaquin  prophylactically  Diarrhea -likely secondary to chemotherapy.  However, symptoms have persisted longer than expected.  Will check stool studies and C. difficile PCR.  If negative, will plan to utilize/maximize antidiarrheals.  Patient with mild hyponatremia but no other significant electrolyte derangements.  Will proceed with IV  fluids today given probable dehydration.  Case and plan discussed with Dr. Babara.   Patient expressed understanding and was in agreement with this plan. She also understands that She can call clinic at any time with any questions, concerns, or complaints.   Thank you for allowing me to participate in the care of this very pleasant patient.   Time Total: 25 minutes  Visit consisted of counseling and education dealing with the complex and emotionally intense issues of symptom management in the setting of serious illness.Greater than 50%  of this time was spent counseling and coordinating care related to the above assessment and plan.  Signed by: Fonda Mower, PhD, NP-C

## 2024-07-19 NOTE — Patient Instructions (Signed)
 Filgrastim Injection What is this medication? FILGRASTIM (fil GRA stim) lowers the risk of infection in people who are receiving chemotherapy. It works by Systems analyst make more white blood cells, which protects your body from infection. It may also be used to help people who have been exposed to high doses of radiation. It can be used to help prepare your body before a stem cell transplant. It works by helping your bone marrow make and release stem cells into the blood. This medicine may be used for other purposes; ask your health care provider or pharmacist if you have questions. COMMON BRAND NAME(S): Neupogen, Nivestym, Nypozi, Releuko, Zarxio What should I tell my care team before I take this medication? They need to know if you have any of these conditions: History of blood diseases, such as sickle cell anemia Kidney disease Recent or ongoing radiation An unusual or allergic reaction to filgrastim, pegfilgrastim, latex, rubber, other medications, foods, dyes, or preservatives Pregnant or trying to get pregnant Breast-feeding How should I use this medication? This medication is injected under the skin or into a vein. It is usually given by your care team in a hospital or clinic setting. It may be given at home. If you get this medication at home, you will be taught how to prepare and give it. Use exactly as directed. Take it as directed on the prescription label at the same time every day. Keep taking it unless your care team tells you to stop. It is important that you put your used needles and syringes in a special sharps container. Do not put them in a trash can. If you do not have a sharps container, call your pharmacist or care team to get one. This medication comes with INSTRUCTIONS FOR USE. Ask your pharmacist for directions on how to use this medication. Read the information carefully. Talk to your pharmacist or care team if you have questions. Talk to your care team about the use of  this medication in children. While it may be prescribed for children for selected conditions, precautions do apply. Overdosage: If you think you have taken too much of this medicine contact a poison control center or emergency room at once. NOTE: This medicine is only for you. Do not share this medicine with others. What if I miss a dose? It is important not to miss any doses. Talk to your care team about what to do if you miss a dose. What may interact with this medication? Medications that may cause a release of neutrophils, such as lithium This list may not describe all possible interactions. Give your health care provider a list of all the medicines, herbs, non-prescription drugs, or dietary supplements you use. Also tell them if you smoke, drink alcohol, or use illegal drugs. Some items may interact with your medicine. What should I watch for while using this medication? Your condition will be monitored carefully while you are receiving this medication. You may need bloodwork while taking this medication. Talk to your care team about your risk of cancer. You may be more at risk for certain types of cancer if you take this medication. What side effects may I notice from receiving this medication? Side effects that you should report to your care team as soon as possible: Allergic reactions--skin rash, itching, hives, swelling of the face, lips, tongue, or throat Capillary leak syndrome--stomach or muscle pain, unusual weakness or fatigue, feeling faint or lightheaded, decrease in the amount of urine, swelling of the ankles, hands,  or feet, trouble breathing High white blood cell level--fever, fatigue, trouble breathing, night sweats, change in vision, weight loss Inflammation of the aorta--fever, fatigue, back, chest, or stomach pain, severe headache Kidney injury (glomerulonephritis)--decrease in the amount of urine, red or dark brown urine, foamy or bubbly urine, swelling of the ankles, hands,  or feet Shortness of breath or trouble breathing Spleen injury--pain in upper left stomach or shoulder Unusual bruising or bleeding Side effects that usually do not require medical attention (report to your care team if they continue or are bothersome): Back pain Bone pain Fatigue Fever Headache Nausea This list may not describe all possible side effects. Call your doctor for medical advice about side effects. You may report side effects to FDA at 1-800-FDA-1088. Where should I keep my medication? Keep out of the reach of children and pets. Keep this medication in the original packaging until you are ready to take it. Protect from light. See product for storage information. Each product may have different instructions. Get rid of any unused medication after the expiration date. To get rid of medications that are no longer needed or have expired: Take the medication to a medications take-back program. Check with your pharmacy or law enforcement to find a location. If you cannot return the medication, ask your pharmacist or care team how to get rid of this medication safely. NOTE: This sheet is a summary. It may not cover all possible information. If you have questions about this medicine, talk to your doctor, pharmacist, or health care provider.  2024 Elsevier/Gold Standard (2022-05-08 00:00:00)

## 2024-07-19 NOTE — Progress Notes (Signed)
 Per andrea loye  zarxio  480mcg approved

## 2024-07-19 NOTE — Telephone Encounter (Signed)
 I told her that she had some issues after chemo had blisters and some kind of bumps on her as well as in her mouth and she had problems with her bowels and that is the reason why she supposed to come in today.  The patient says she is going to be there today at 9:45

## 2024-07-19 NOTE — Progress Notes (Signed)
 Patient states since her first chemo she has been feeling bad ever since. Patient states she has little to no Appetite, No energy. Patient also has been having diarrhea.

## 2024-07-19 NOTE — Telephone Encounter (Addendum)
 Received critical lab from [facility] at [facility]. ANC 0.3. Sidra Mower, NP notified.

## 2024-07-20 ENCOUNTER — Other Ambulatory Visit: Payer: Self-pay | Admitting: Hospice and Palliative Medicine

## 2024-07-20 ENCOUNTER — Inpatient Hospital Stay

## 2024-07-20 DIAGNOSIS — D702 Other drug-induced agranulocytosis: Secondary | ICD-10-CM

## 2024-07-20 DIAGNOSIS — Z5111 Encounter for antineoplastic chemotherapy: Secondary | ICD-10-CM | POA: Diagnosis not present

## 2024-07-20 DIAGNOSIS — D509 Iron deficiency anemia, unspecified: Secondary | ICD-10-CM

## 2024-07-20 MED ORDER — FILGRASTIM-SNDZ 480 MCG/0.8ML IJ SOSY
480.0000 ug | PREFILLED_SYRINGE | Freq: Once | INTRAMUSCULAR | Status: AC
  Administered 2024-07-20: 480 ug via SUBCUTANEOUS
  Filled 2024-07-20: qty 0.8

## 2024-07-20 MED ORDER — VANCOMYCIN HCL 125 MG PO CAPS: 125.0000 mg | ORAL_CAPSULE | Freq: Four times a day (QID) | ORAL | 0 refills | Status: DC

## 2024-07-20 NOTE — Progress Notes (Signed)
 C. difficile PCR positive, toxin negative.  Indeterminate results but given active diarrhea, will proceed with initiation of oral vancomycin  125 mg 4 times daily x 10 days.  Discussed with Dr. Babara.  Discussed with patient husband.

## 2024-07-21 ENCOUNTER — Telehealth: Payer: Self-pay

## 2024-07-21 ENCOUNTER — Inpatient Hospital Stay

## 2024-07-21 DIAGNOSIS — D509 Iron deficiency anemia, unspecified: Secondary | ICD-10-CM

## 2024-07-21 DIAGNOSIS — D702 Other drug-induced agranulocytosis: Secondary | ICD-10-CM

## 2024-07-21 DIAGNOSIS — Z5111 Encounter for antineoplastic chemotherapy: Secondary | ICD-10-CM | POA: Diagnosis not present

## 2024-07-21 MED ORDER — FILGRASTIM-SNDZ 480 MCG/0.8ML IJ SOSY
480.0000 ug | PREFILLED_SYRINGE | Freq: Once | INTRAMUSCULAR | Status: AC
  Administered 2024-07-21: 480 ug via SUBCUTANEOUS
  Filled 2024-07-21: qty 0.8

## 2024-07-21 NOTE — Telephone Encounter (Signed)
 Patient was questioning if she needs to continue Levaquin  now that she is taking Vancomycin  for positive C. Diff stool test.  Confirmed with Josh B, NP that patient is to continue Levaquin  that was prescribed prophylaxis measure with neutropenia.    Patient informed and agrees.

## 2024-07-29 ENCOUNTER — Inpatient Hospital Stay: Admitting: Oncology

## 2024-07-29 ENCOUNTER — Inpatient Hospital Stay

## 2024-07-29 ENCOUNTER — Encounter: Payer: Self-pay | Admitting: Oncology

## 2024-07-29 ENCOUNTER — Inpatient Hospital Stay: Attending: Oncology

## 2024-07-29 VITALS — BP 143/75 | HR 88 | Temp 98.5°F | Resp 18 | Wt 196.0 lb

## 2024-07-29 DIAGNOSIS — Z5111 Encounter for antineoplastic chemotherapy: Secondary | ICD-10-CM | POA: Insufficient documentation

## 2024-07-29 DIAGNOSIS — A0472 Enterocolitis due to Clostridium difficile, not specified as recurrent: Secondary | ICD-10-CM | POA: Insufficient documentation

## 2024-07-29 DIAGNOSIS — C541 Malignant neoplasm of endometrium: Secondary | ICD-10-CM

## 2024-07-29 DIAGNOSIS — M069 Rheumatoid arthritis, unspecified: Secondary | ICD-10-CM

## 2024-07-29 DIAGNOSIS — E039 Hypothyroidism, unspecified: Secondary | ICD-10-CM

## 2024-07-29 DIAGNOSIS — E876 Hypokalemia: Secondary | ICD-10-CM | POA: Diagnosis not present

## 2024-07-29 DIAGNOSIS — G620 Drug-induced polyneuropathy: Secondary | ICD-10-CM | POA: Diagnosis not present

## 2024-07-29 DIAGNOSIS — Z5189 Encounter for other specified aftercare: Secondary | ICD-10-CM | POA: Insufficient documentation

## 2024-07-29 LAB — CBC WITH DIFFERENTIAL (CANCER CENTER ONLY)
Abs Immature Granulocytes: 0.05 K/uL (ref 0.00–0.07)
Basophils Absolute: 0.1 K/uL (ref 0.0–0.1)
Basophils Relative: 1 %
Eosinophils Absolute: 0.8 K/uL — ABNORMAL HIGH (ref 0.0–0.5)
Eosinophils Relative: 13 %
HCT: 35.9 % — ABNORMAL LOW (ref 36.0–46.0)
Hemoglobin: 10.9 g/dL — ABNORMAL LOW (ref 12.0–15.0)
Immature Granulocytes: 1 %
Lymphocytes Relative: 39 %
Lymphs Abs: 2.4 K/uL (ref 0.7–4.0)
MCH: 26.1 pg (ref 26.0–34.0)
MCHC: 30.4 g/dL (ref 30.0–36.0)
MCV: 85.9 fL (ref 80.0–100.0)
Monocytes Absolute: 0.7 K/uL (ref 0.1–1.0)
Monocytes Relative: 11 %
Neutro Abs: 2.1 K/uL (ref 1.7–7.7)
Neutrophils Relative %: 35 %
Platelet Count: 193 K/uL (ref 150–400)
RBC: 4.18 MIL/uL (ref 3.87–5.11)
RDW: 16.7 % — ABNORMAL HIGH (ref 11.5–15.5)
WBC Count: 6 K/uL (ref 4.0–10.5)
nRBC: 0 % (ref 0.0–0.2)

## 2024-07-29 LAB — CMP (CANCER CENTER ONLY)
ALT: 13 U/L (ref 0–44)
AST: 21 U/L (ref 15–41)
Albumin: 3.8 g/dL (ref 3.5–5.0)
Alkaline Phosphatase: 75 U/L (ref 38–126)
Anion gap: 9 (ref 5–15)
BUN: 14 mg/dL (ref 8–23)
CO2: 23 mmol/L (ref 22–32)
Calcium: 8.9 mg/dL (ref 8.9–10.3)
Chloride: 107 mmol/L (ref 98–111)
Creatinine: 0.79 mg/dL (ref 0.44–1.00)
GFR, Estimated: >60 mL/min (ref >60)
Glucose, Bld: 151 mg/dL — ABNORMAL HIGH (ref 70–99)
Potassium: 3.7 mmol/L (ref 3.5–5.1)
Sodium: 139 mmol/L (ref 135–145)
Total Bilirubin: 0.5 mg/dL (ref 0.0–1.2)
Total Protein: 7.1 g/dL (ref 6.5–8.1)

## 2024-07-29 NOTE — Assessment & Plan Note (Signed)
follow-up rheumatologist. - off Prednisone, now on Enbrel.

## 2024-07-29 NOTE — Assessment & Plan Note (Signed)
 To finish course of Vancomycin .

## 2024-07-29 NOTE — Assessment & Plan Note (Signed)
 Currently on Synthroid 75mcg daily, [facility].  TSH is high, T4 is decreased. Reviewed results with patient and she will contact her [facility] provide for adjustment.

## 2024-07-29 NOTE — Assessment & Plan Note (Addendum)
 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 Patient with developed recurrent disease, retroperitoneal mass-> biopsy proven recurrence.pending MMR status, HER2  Labs are reviewed and discussed with patient.  Hold cycle 2 carboplatin  AUC 4.5 Taxol  135mg /m2  due to C diff colitis

## 2024-07-29 NOTE — Progress Notes (Signed)
 Hematology/Oncology Progress note [phone] [phone]    CHIEF COMPLAINTS/REASON FOR VISIT:  Follow up for serous endometrial cancer  ASSESSMENT & PLAN:   Recurrent carcinoma of endometrium (HCC) Recurrent endometrial cancer, TP 53 + Status post 6 cycles of chemotherapy with carboplatin  and Taxol . Patient with developed recurrent disease, retroperitoneal mass-> biopsy proven recurrence.pending MMR status, HER2  Labs are reviewed and discussed with patient.  Hold cycle 2 carboplatin  AUC 4.5 Taxol  135mg /m2  due to C diff colitis  Rheumatoid arthritis (HCC) follow-up rheumatologist. - off Prednisone , now on Enbrel.  C. difficile colitis To finish course of Vancomycin .    Hypothyroidism, adult Currently on Synthroid 75mcg daily, [facility].  TSH is high, T4 is decreased. Reviewed results with patient and she will contact her [facility] provide for adjustment.      Orders Placed This Encounter  Procedures   CBC with Differential (Cancer Center Only)    Standing Status:   Future    Expected Date:   08/19/2024    Expiration Date:   08/19/2025   CMP (Cancer Center only)    Standing Status:   Future    Expected Date:   08/19/2024    Expiration Date:   08/19/2025   Follow-up 3 weeks All questions were answered. The patient knows to call the clinic with any problems, questions or concerns.  [name], MD, PhD [facility] Hematology Oncology 07/29/2024   HISTORY OF PRESENTING ILLNESS:   Sandra Fischer is a  73 y.o.  female presents for high-grade serous endometrial cancer Oncology History  Recurrent carcinoma of endometrium (HCC)  07/15/2019 Initial Diagnosis   Endometrial cancer  Patient had TH/BSO sentinel lymph node biopsy by [facility] GYN oncology Dr. Augustin on 06/21/2019. Extensive medical records from [facility] review was performed by me.  Tumor size 9.3 cm, invading 99% of myometrium [3.55 out of 3.6 cm], positive right external iliac  sentinel lymph nodes.  Negative washing.  No LVI, serosa is uninvolved. 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 [id number]  FIGO stage IIIC1 MSI intact HER 2 negative.   her case was discussed at [facility] tumor board on 07/04/2019  PORTEC-3 regimen was recommended given subgroup analysis showing benefit in the serous histology  Patient declined treatment.     10/20/2019 Relapse/Recurrence   CT chest abdomen pelvis with contrast showed disease recurrence. There are multiple newly enlarged retroperitoneal and right iliac lymph nodes the largest the left retroperitoneal node measuring 1.9 x 1.6 cm concerning for nodal metastasis disease. Unchanged 4 mm groundglass pulmonary nodule of the left pulmonary apex. This remains nonspecific.    11/07/2019 - 02/20/2020 Chemotherapy   carboplatin  AUC 4.5 and paclitaxel  175 mg/m2 x 2 cycles carboplatin  AUC4.5 and paclitaxel  135 mg/m2 x 4 cycles   03/12/2020 Imaging   CT chest abdomen pelvis with contrast showed 1. The multiple enlarged retroperitoneal and right iliac lymph nodes identified as new on the previous exam from 10/20/2019 have resolved completely in the interval. 2. Tiny ground-glass pulmonary nodule medial left lung apex is stable in the interval. Likely benign, continued attention on follow-up recommended. 3. No new or progressive interval findings. 4.  Aortic Atherosclerois (ICD10-170.0   06/07/2024 Imaging   CT chest abdomen pelvis with contrast showed 1. Newly enlarged left retroperitoneal lymph nodes measuring up to 2.5 x 2.2 cm, consistent with nodal metastatic disease. 2. Occasional tiny pulmonary nodules unchanged, most likely benign and incidental. 3. Status post hysterectomy. 4. Nonobstructive left nephrolithiasis.  06/21/2024 Relapse/Recurrence   Periaortic left side soft tissue mass biopsy showed metastatic poorly differentiated carcinoma.    Diagnosis Note : The specimen demonstrates  a pleomorphic high-grade epithelioid proliferation. Immunohistochemical stains were performed to characterize the tumor cells. The cells are positive for CK AE1/AE3, GATA3, CDX2, and p40. P53 is mutant type of staining. The cells are negative for PAX8, TTF-1, WT1, ER, -PR, S100 and SOX10. The immunoprofile is not specific and is not indicative of a specific site of primary. The patient's history of endometrial carcinoma is notes. The tumor most likely represents a metastasis from the patient's known endometrial carcinoma since metastatic tumor cells could aberrant loss or gain expression of lineage markers. However, metastasis from other organ system cannot be entirely excluded. Correlation with imaging findings is recommended. Controls worked appropriately.    07/08/2024 -  Chemotherapy   Patient is on Treatment Plan : UTERINE Carboplatin  + Paclitaxel  q21d       INTERVAL HISTORY Sandra Fischer is a 73 y.o. female who has above history reviewed by me today presents for follow up visit for management of endometrial cancer. + Diarrhea due to C diff colitis. On Vanomycin treatment. Diarrhea has much improved, sometime semi formed. No fever chills.   Review of Systems  Constitutional:  Positive for fatigue. Negative for appetite change, chills and fever.  HENT:   Negative for hearing loss and voice change.   Eyes:  Negative for eye problems.  Respiratory:  Negative for chest tightness and cough.   Cardiovascular:  Negative for chest pain.  Gastrointestinal:  Positive for diarrhea. Negative for abdominal distention, abdominal pain and blood in stool.  Endocrine: Negative for hot flashes.  Genitourinary:  Negative for difficulty urinating and frequency.   Musculoskeletal:  Negative for arthralgias.  Skin:  Negative for itching and rash.  Neurological:  Positive for numbness. Negative for extremity weakness.  Hematological:  Negative for adenopathy.  Psychiatric/Behavioral:  Negative for confusion.      MEDICAL HISTORY:  Past Medical History:  Diagnosis Date   Asthma    Asthma    Asthma exacerbation 08/22/2016   Cancer (HCC)    Collagen vascular disease (HCC)    Diabetes mellitus without complication (HCC)    History of Diabetes    Dyspnea    Environmental allergies    Fibromyalgia    High cholesterol    Hypertension    Hypothyroidism    Iron  deficiency anemia 07/03/2020   Neuropathy    RA (rheumatoid arthritis) (HCC)    Sleep apnea    Thyroid  disease     SURGICAL HISTORY: Past Surgical History:  Procedure Laterality Date   ABDOMINAL HYSTERECTOMY     CATARACT EXTRACTION     CHOLECYSTECTOMY     COLONOSCOPY WITH PROPOFOL  N/A 03/14/2022   Procedure: COLONOSCOPY WITH PROPOFOL ;  Surgeon: Maryruth Ole DASEN, MD;  Location: [facility] ENDOSCOPY;  Service: Endoscopy;  Laterality: N/A;  DM   CYSTOURETHROSCOPY     DIAGNOSTIC LAPAROSCOPY     EYE SURGERY     fibroids removed     PORTA CATH REMOVAL N/A 02/14/2021   Procedure: PORTA CATH REMOVAL;  Surgeon: Marea Selinda RAMAN, MD;  Location: [facility] INVASIVE CV LAB;  Service: Cardiovascular;  Laterality: N/A;   THYROIDECTOMY, PARTIAL     tummy tuck      SOCIAL HISTORY: Social History   Socioeconomic History   Marital status: Married    Spouse name: Cedric    Number of children: Not on file   Years of  education: Not on file   Highest education level: Not on file  Occupational History   Occupation: Retired  Tobacco Use   Smoking status: Former    Current packs/day: 0.00    Average packs/day: 0.1 packs/day for 0.2 years    Types: Cigarettes    Start date: 09/1978    Quit date: 1980    Years since quitting: 45.6   Smokeless tobacco: Never  Vaping Use   Vaping status: Never Used  Substance and Sexual Activity   Alcohol use: No   Drug use: No   Sexual activity: Yes    Birth control/protection: Post-menopausal  Other Topics Concern   Not on file  Social History Narrative   Not on file   Social Drivers of Health    Financial Resource Strain: Not on file  Food Insecurity: Not on file  Transportation Needs: Not on file  Physical Activity: Not on file  Stress: Not on file  Social Connections: Not on file  Intimate Partner Violence: Not on file    FAMILY HISTORY: Family History  Problem Relation Age of Onset   Diabetes Mother    Hypertension Mother    Hyperlipidemia Mother    Dementia Mother    Breast cancer Neg Hx    Ovarian cancer Neg Hx    Colon cancer Neg Hx     ALLERGIES:  is allergic to abatacept, codeine, iodine, latex, shellfish allergy, trimethoprim  hydrochloride [trimethoprim ], and penicillins.  MEDICATIONS:  Current Outpatient Medications  Medication Sig Dispense Refill   Acetaminophen  (TYLENOL  EXTRA STRENGTH PO) Take 1,300 mg by mouth 2 (two) times daily as needed.     ascorbic acid (VITAMIN C) 500 MG tablet Take 500 mg by mouth daily.     aspirin  EC 81 MG tablet Take 81 mg by mouth daily.     azithromycin  (ZITHROMAX  Z-PAK) 250 MG tablet Take 2 tablets (500 mg) on  Day 1,  followed by 1 tablet (250 mg) once daily on Days 2 through 5. 6 each 0   Calcium Carbonate-Vit D-Min (CALCIUM 600+D PLUS MINERALS) 600-400 MG-UNIT TABS Take 1 tablet by mouth 2 (two) times daily.     cetirizine  (ZYRTEC ) 10 MG tablet Take 1 tablet (10 mg total) by mouth daily. 30 tablet 0   Cholecalciferol (D3 ADULT PO) Take 1 capsule by mouth daily.     clotrimazole-betamethasone (LOTRISONE) cream Apply topically 4 (four) times daily as needed.     dexamethasone  (DECADRON ) 4 MG tablet Take 2 tablets (8 mg total) by mouth daily for 3 days. Start the day after chemotherapy. Take with food. 30 tablet 1   empagliflozin (JARDIANCE) 25 MG TABS tablet Take 12.5 mg by mouth daily.     EPINEPHrine  0.3 mg/0.3 mL IJ SOAJ injection Use as directed for severe allergic reaction. 2 Device 1   etanercept (ENBREL) 25 MG injection Inject 25 mg into the skin.     fluconazole  (DIFLUCAN ) 150 MG tablet Take 1 tablet (150 mg total)  by mouth daily. 1 tablet 0   fluticasone  (FLONASE ) 50 MCG/ACT nasal spray Place 1 spray into both nostrils 2 (two) times daily. 16 g 0   fluticasone -salmeterol (ADVAIR) 250-50 MCG/ACT AEPB Inhale 1 puff into the lungs in the morning and at bedtime.     furosemide (LASIX) 10 MG/ML solution Take by mouth daily.     gabapentin  (NEURONTIN ) 300 MG capsule Take 1 capsule (300 mg total) by mouth 2 (two) times daily. 60 capsule 0   hydrALAZINE (APRESOLINE) 25 MG  tablet Take 25 mg by mouth 2 (two) times daily.     ipratropium (ATROVENT ) 0.02 % nebulizer solution Take 0.5 mg by nebulization every 4 (four) hours as needed.      leflunomide (ARAVA) 20 MG tablet Take 20 mg by mouth daily.     levofloxacin  (LEVAQUIN ) 500 MG tablet Take 1 tablet (500 mg total) by mouth daily. 10 tablet 0   levothyroxine (SYNTHROID) 75 MCG tablet Take 75 mcg by mouth daily before breakfast.     lisinopril (PRINIVIL,ZESTRIL) 40 MG tablet Take 20 mg by mouth in the morning and at bedtime.      metoprolol succinate (TOPROL-XL) 50 MG 24 hr tablet Take 50 mg by mouth daily. Take with or immediately following a meal.     montelukast  (SINGULAIR ) 10 MG tablet Take 10 mg by mouth at bedtime.     nitroGLYCERIN (NITROSTAT) 0.4 MG SL tablet Place under the tongue. Place 1 tablet (0.4 mg total) under the tongue every 5 (five) minutes as needed for Chest pain May take up to 3 doses.     nortriptyline (PAMELOR) 25 MG capsule Take 25 mg by mouth at bedtime.     olopatadine  (PATANOL) 0.1 % ophthalmic solution Place 1 drop into both eyes 2 (two) times daily. 5 mL 5   omeprazole (PRILOSEC) 10 MG capsule Take 20 mg by mouth daily.     ondansetron  (ZOFRAN ) 8 MG tablet Take 1 tablet (8 mg total) by mouth every 8 (eight) hours as needed for nausea or vomiting. Start on the third day after chemotherapy. 30 tablet 1   ondansetron  (ZOFRAN -ODT) 4 MG disintegrating tablet Take 1 tablet (4 mg total) by mouth every 8 (eight) hours as needed for nausea or  vomiting. 15 tablet 0   potassium chloride  SA (KLOR-CON  M) 20 MEQ tablet Take 1 tablet (20 mEq total) by mouth daily. 30 tablet 0   pravastatin (PRAVACHOL) 40 MG tablet Take 40 mg by mouth daily.     predniSONE  (DELTASONE ) 5 MG tablet Take 5 mg by mouth daily with breakfast.     predniSONE  (DELTASONE ) 50 MG tablet Take 1 tablet (50 mg total) by mouth See admin instructions. Prednisone  - take 50 mg by mouth at 13 hours, 7 hours, and 1 hour before contrast media injection 3 tablet 0   Prenatal Vit-Fe Fumarate-FA (MULTIVITAMIN-PRENATAL) 27-0.8 MG TABS tablet Take 1 tablet by mouth daily.      prochlorperazine  (COMPAZINE ) 10 MG tablet Take 1 tablet (10 mg total) by mouth every 6 (six) hours as needed for nausea or vomiting. 30 tablet 1   sulfaSALAzine (AZULFIDINE) 500 MG tablet TAKE ONE TABLET BY MOUTH TWO TIMES A DAY FOR 14 DAYS, THEN TAKE TWO TABLETS TWO TIMES A DAY FOR 14 DAYS, THEN TAKE THREE TABLETS TWO TIMES A DAY FOR 62 DAYS FOR INFLAMMATION. TAKE AFTER MEALS     vancomycin  (VANCOCIN ) 125 MG capsule Take 1 capsule (125 mg total) by mouth 4 (four) times daily. 40 capsule 0   verapamil (CALAN) 120 MG tablet Take 240 mg by mouth 2 (two) times daily.     azelastine  (ASTELIN ) 0.1 % nasal spray Place 2 sprays into both nostrils 2 (two) times daily. (Patient not taking: Reported on 07/29/2024) 30 mL 5   desloratadine (CLARINEX) 5 MG tablet Take 5 mg by mouth daily. (Patient not taking: Reported on 07/29/2024)     docusate sodium (COLACE) 50 MG capsule Take 50 mg by mouth 2 (two) times daily. (Patient not taking: Reported  on 07/29/2024)     leflunomide (ARAVA) 20 MG tablet Take 1 tablet by mouth daily. (Patient not taking: Reported on 07/29/2024)     senna-docusate (SENOKOT-S) 8.6-50 MG tablet Take 1 tablet by mouth at bedtime as needed for mild constipation. (Patient not taking: Reported on 07/29/2024)     tiotropium (SPIRIVA) 18 MCG inhalation capsule Place 18 mcg into inhaler and inhale daily. (Patient not  taking: Reported on 07/29/2024)     No current facility-administered medications for this visit.     PHYSICAL EXAMINATION: ECOG PERFORMANCE STATUS: 1 - Symptomatic but completely ambulatory  Physical Exam Constitutional:      General: She is not in acute distress.    Appearance: She is obese.  HENT:     Head: Normocephalic and atraumatic.  Eyes:     General: No scleral icterus. Cardiovascular:     Rate and Rhythm: Normal rate and regular rhythm.     Heart sounds: Normal heart sounds.  Pulmonary:     Effort: Pulmonary effort is normal. No respiratory distress.     Breath sounds: Normal breath sounds. No wheezing.  Abdominal:     General: Bowel sounds are normal. There is no distension.     Palpations: Abdomen is soft.  Musculoskeletal:        General: Deformity present. Normal range of motion.     Cervical back: Normal range of motion and neck supple.  Skin:    General: Skin is warm and dry.     Findings: No erythema or rash.  Neurological:     Mental Status: She is alert and oriented to person, place, and time. Mental status is at baseline.  Psychiatric:        Mood and Affect: Mood normal.     LABORATORY DATA:  I have reviewed the data as listed    Latest Ref Rng & Units 07/29/2024    8:30 AM 07/19/2024   10:49 AM 07/08/2024    8:50 AM  CBC  WBC 4.0 - 10.5 K/uL 6.0  3.2  7.3   Hemoglobin 12.0 - 15.0 g/dL 89.0  88.9  87.2   Hematocrit 36.0 - 46.0 % 35.9  35.5  42.8   Platelets 150 - 400 K/uL 193  144  285       Latest Ref Rng & Units 07/29/2024    8:30 AM 07/19/2024   10:49 AM 07/08/2024    8:50 AM  CMP  Glucose 70 - 99 mg/dL 848  871  839   BUN 8 - 23 mg/dL 14  6  18    Creatinine 0.44 - 1.00 mg/dL 9.20  9.10  8.90   Sodium 135 - 145 mmol/L 139  132  137   Potassium 3.5 - 5.1 mmol/L 3.7  3.6  3.9   Chloride 98 - 111 mmol/L 107  99  102   CO2 22 - 32 mmol/L 23  24  24    Calcium 8.9 - 10.3 mg/dL 8.9  8.5  9.0   Total Protein 6.5 - 8.1 g/dL 7.1  7.0  7.4   Total  Bilirubin 0.0 - 1.2 mg/dL 0.5  0.9  0.8   Alkaline Phos 38 - 126 U/L 75  55  64   AST 15 - 41 U/L 21  23  20    ALT 0 - 44 U/L 13  22  15       Iron /TIBC/Ferritin/ %Sat    Component Value Date/Time   IRON  43 01/02/2021 1331   TIBC 200 (  L) 01/02/2021 1331   FERRITIN 759 (H) 01/02/2021 1331   IRONPCTSAT 22 01/02/2021 1331      RADIOGRAPHIC STUDIES: I have personally reviewed the radiological images as listed and agreed with the findings in the report., CT ABDOMINAL MASS BIOPSY Result Date: 06/21/2024 INDICATION: Retroperitoneal mass EXAM: CT-guided biopsy TECHNIQUE: Multidetector CT imaging of the abdomen was performed following the standard protocol with/without IV contrast. RADIATION DOSE REDUCTION: This exam was performed according to the departmental dose-optimization program which includes automated exposure control, adjustment of the mA and/or kV according to patient size and/or use of iterative reconstruction technique. MEDICATIONS: None. ANESTHESIA/SEDATION: Moderate (conscious) sedation was employed during this procedure. A total of Versed  2 mg and Fentanyl  100 mcg was administered intravenously by the radiology nurse. Total intra-service moderate Sedation Time: 35 minutes. The patient's level of consciousness and vital signs were monitored continuously by radiology nursing throughout the procedure under my direct supervision. COMPLICATIONS: None immediate. PROCEDURE: Informed written consent was obtained from the patient after a thorough discussion of the procedural risks, benefits and alternatives. All questions were addressed. Maximal Sterile Barrier Technique was utilized including caps, mask, sterile gowns, sterile gloves, sterile drape, hand hygiene and skin antiseptic. A timeout was performed prior to the initiation of the procedure. With patient in a prone position and markers on the the rectal lumbar spine, initial axial images were obtained. The retroperitoneal Peri aortic mass is  again identified. The patient's skin was prepped and draped in the usual sterile fashion. Local Anesthesia was achieved by infiltrating subcutaneous tissue with 1% lidocaine  from the skin to the paraspinous muscles. A small incision was then made. He an introducer needle was then advanced in sequential fashion with intermittent imaging until the needle tip was identified at the retroperitoneal lymph node. The needle was removed leaving the cannula behind and a BioPince needle was then advanced and multiple samples were obtained and placed in formalin. Final images demonstrate no active hemorrhage. The cannula and all needles removed from the patient. Sterile dressing applied. IMPRESSION: Satisfactory CT-guided core needle biopsy of a retroperitoneal left-sided periaortic lymph node. Electronically Signed   By: Cordella Banner   On: 06/21/2024 13:33   CT CHEST ABDOMEN PELVIS W CONTRAST Result Date: 06/09/2024 CLINICAL DATA:  Endometrial cancer restaging * Tracking Code: BO * EXAM: CT CHEST, ABDOMEN, AND PELVIS WITH CONTRAST TECHNIQUE: Multidetector CT imaging of the chest, abdomen and pelvis was performed following the standard protocol during bolus administration of intravenous contrast. RADIATION DOSE REDUCTION: This exam was performed according to the departmental dose-optimization program which includes automated exposure control, adjustment of the mA and/or kV according to patient size and/or use of iterative reconstruction technique. CONTRAST:  OMNIPAQUE  IOHEXOL  300 MG/ML  SOLN COMPARISON:  04/29/2023 FINDINGS: CT CHEST FINDINGS Cardiovascular: Aortic atherosclerosis. Normal heart size. No pericardial effusion. Mediastinum/Nodes: No enlarged mediastinal, hilar, or axillary lymph nodes. Thyroid  gland, trachea, and esophagus demonstrate no significant findings. Lungs/Pleura: Mild diffuse bilateral bronchial wall thickening. Occasional tiny pulmonary nodules unchanged, for example in the dependent right  lower lobe measuring 0.3 cm (series 4, image 76) and in the right apex measuring 0.2 cm (series 4, image 19). No pleural effusion or pneumothorax. Musculoskeletal: No chest wall abnormality. No acute osseous findings. CT ABDOMEN PELVIS FINDINGS Hepatobiliary: No focal liver abnormality is seen. Status post cholecystectomy. No biliary dilatation. Pancreas: Unremarkable. No pancreatic ductal dilatation or surrounding inflammatory changes. Spleen: Normal in size without significant abnormality. Adrenals/Urinary Tract: Adrenal glands are unremarkable. Small nonobstructive calculus of  the inferior pole of the left kidney. No right-sided calculi, ureteral calculi, or hydronephrosis. Bladder is unremarkable. Stomach/Bowel: Stomach is within normal limits. Appendix appears normal. No evidence of bowel wall thickening, distention, or inflammatory changes. Descending colonic diverticulosis. Vascular/Lymphatic: Aortic atherosclerosis. Newly enlarged left retroperitoneal lymph nodes measuring up to 2.5 x 2.2 cm (series 2, image 61). Reproductive: Status post hysterectomy. Other: Small fat containing midline ventral hernia (series 2, image 81). No ascites. Musculoskeletal: No acute osseous findings. IMPRESSION: 1. Newly enlarged left retroperitoneal lymph nodes measuring up to 2.5 x 2.2 cm, consistent with nodal metastatic disease. 2. Occasional tiny pulmonary nodules unchanged, most likely benign and incidental. 3. Status post hysterectomy. 4. Nonobstructive left nephrolithiasis. Aortic Atherosclerosis (ICD10-I70.0). Electronically Signed   By: Marolyn JONETTA Jaksch M.D.   On: 06/09/2024 22:01

## 2024-08-03 ENCOUNTER — Ambulatory Visit: Payer: Medicare PPO

## 2024-08-12 ENCOUNTER — Telehealth: Payer: Self-pay | Admitting: *Deleted

## 2024-08-12 NOTE — Telephone Encounter (Signed)
 Patient called to request a refill for the medication Josh Borders prescribed for her diarrhea. Her diarrhea has improved but she is worried that her next round of chemo will again affect her bowel. She did not know the name of the medication.

## 2024-08-16 NOTE — Progress Notes (Signed)
 Did not want oop expense

## 2024-08-19 ENCOUNTER — Inpatient Hospital Stay

## 2024-08-19 ENCOUNTER — Encounter: Payer: Self-pay | Admitting: Oncology

## 2024-08-19 ENCOUNTER — Inpatient Hospital Stay (HOSPITAL_BASED_OUTPATIENT_CLINIC_OR_DEPARTMENT_OTHER): Admitting: Oncology

## 2024-08-19 VITALS — BP 168/88 | HR 76

## 2024-08-19 VITALS — BP 144/71 | HR 61 | Temp 98.9°F | Resp 18 | Wt 195.0 lb

## 2024-08-19 DIAGNOSIS — Z5111 Encounter for antineoplastic chemotherapy: Secondary | ICD-10-CM | POA: Diagnosis not present

## 2024-08-19 DIAGNOSIS — G620 Drug-induced polyneuropathy: Secondary | ICD-10-CM

## 2024-08-19 DIAGNOSIS — E876 Hypokalemia: Secondary | ICD-10-CM

## 2024-08-19 DIAGNOSIS — C541 Malignant neoplasm of endometrium: Secondary | ICD-10-CM

## 2024-08-19 DIAGNOSIS — M069 Rheumatoid arthritis, unspecified: Secondary | ICD-10-CM

## 2024-08-19 DIAGNOSIS — A0472 Enterocolitis due to Clostridium difficile, not specified as recurrent: Secondary | ICD-10-CM

## 2024-08-19 DIAGNOSIS — T451X5A Adverse effect of antineoplastic and immunosuppressive drugs, initial encounter: Secondary | ICD-10-CM

## 2024-08-19 LAB — CMP (CANCER CENTER ONLY)
ALT: 11 U/L (ref 0–44)
AST: 17 U/L (ref 15–41)
Albumin: 3.7 g/dL (ref 3.5–5.0)
Alkaline Phosphatase: 74 U/L (ref 38–126)
Anion gap: 10 (ref 5–15)
BUN: 17 mg/dL (ref 8–23)
CO2: 22 mmol/L (ref 22–32)
Calcium: 9 mg/dL (ref 8.9–10.3)
Chloride: 106 mmol/L (ref 98–111)
Creatinine: 0.71 mg/dL (ref 0.44–1.00)
GFR, Estimated: >60 mL/min (ref >60)
Glucose, Bld: 148 mg/dL — ABNORMAL HIGH (ref 70–99)
Potassium: 3.4 mmol/L — ABNORMAL LOW (ref 3.5–5.1)
Sodium: 138 mmol/L (ref 135–145)
Total Bilirubin: 0.8 mg/dL (ref 0.0–1.2)
Total Protein: 7.7 g/dL (ref 6.5–8.1)

## 2024-08-19 LAB — CBC WITH DIFFERENTIAL (CANCER CENTER ONLY)
Abs Immature Granulocytes: 0.04 K/uL (ref 0.00–0.07)
Basophils Absolute: 0.1 K/uL (ref 0.0–0.1)
Basophils Relative: 1 %
Eosinophils Absolute: 0.3 K/uL (ref 0.0–0.5)
Eosinophils Relative: 3 %
HCT: 39.6 % (ref 36.0–46.0)
Hemoglobin: 12.1 g/dL (ref 12.0–15.0)
Immature Granulocytes: 0 %
Lymphocytes Relative: 31 %
Lymphs Abs: 2.8 K/uL (ref 0.7–4.0)
MCH: 26.9 pg (ref 26.0–34.0)
MCHC: 30.6 g/dL (ref 30.0–36.0)
MCV: 88 fL (ref 80.0–100.0)
Monocytes Absolute: 0.7 K/uL (ref 0.1–1.0)
Monocytes Relative: 8 %
Neutro Abs: 5.2 K/uL (ref 1.7–7.7)
Neutrophils Relative %: 57 %
Platelet Count: 263 K/uL (ref 150–400)
RBC: 4.5 MIL/uL (ref 3.87–5.11)
RDW: 18.1 % — ABNORMAL HIGH (ref 11.5–15.5)
WBC Count: 9.1 K/uL (ref 4.0–10.5)
nRBC: 0 % (ref 0.0–0.2)

## 2024-08-19 MED ORDER — DEXAMETHASONE SODIUM PHOSPHATE 10 MG/ML IJ SOLN
10.0000 mg | Freq: Once | INTRAMUSCULAR | Status: AC
  Administered 2024-08-19: 10 mg via INTRAVENOUS
  Filled 2024-08-19: qty 1

## 2024-08-19 MED ORDER — PEGFILGRASTIM 6 MG/0.6ML ~~LOC~~ PSKT
6.0000 mg | PREFILLED_SYRINGE | Freq: Once | SUBCUTANEOUS | Status: AC
  Administered 2024-08-19: 6 mg via SUBCUTANEOUS
  Filled 2024-08-19: qty 0.6

## 2024-08-19 MED ORDER — PROCHLORPERAZINE EDISYLATE 10 MG/2ML IJ SOLN
10.0000 mg | Freq: Once | INTRAMUSCULAR | Status: AC
  Administered 2024-08-19: 10 mg via INTRAVENOUS
  Filled 2024-08-19: qty 2

## 2024-08-19 MED ORDER — DIPHENHYDRAMINE HCL 50 MG/ML IJ SOLN
50.0000 mg | Freq: Once | INTRAMUSCULAR | Status: AC
  Administered 2024-08-19: 50 mg via INTRAVENOUS
  Filled 2024-08-19: qty 1

## 2024-08-19 MED ORDER — SODIUM CHLORIDE 0.9 % IV SOLN
135.0000 mg/m2 | Freq: Once | INTRAVENOUS | Status: AC
  Administered 2024-08-19: 264 mg via INTRAVENOUS
  Filled 2024-08-19: qty 44

## 2024-08-19 MED ORDER — APREPITANT 130 MG/18ML IV EMUL: 130.0000 mg | Freq: Once | INTRAVENOUS | Status: DC

## 2024-08-19 MED ORDER — POTASSIUM CHLORIDE CRYS ER 10 MEQ PO TBCR: 10.0000 meq | EXTENDED_RELEASE_TABLET | Freq: Every day | ORAL | 0 refills | Status: DC

## 2024-08-19 MED ORDER — PALONOSETRON HCL INJECTION 0.25 MG/5ML
0.2500 mg | Freq: Once | INTRAVENOUS | Status: AC
  Administered 2024-08-19: 0.25 mg via INTRAVENOUS
  Filled 2024-08-19: qty 5

## 2024-08-19 MED ORDER — SODIUM CHLORIDE 0.9 % IV SOLN
456.3000 mg | Freq: Once | INTRAVENOUS | Status: AC
  Administered 2024-08-19: 460 mg via INTRAVENOUS
  Filled 2024-08-19: qty 46

## 2024-08-19 MED ORDER — FAMOTIDINE IN NACL 20-0.9 MG/50ML-% IV SOLN
20.0000 mg | Freq: Once | INTRAVENOUS | Status: AC
  Administered 2024-08-19: 20 mg via INTRAVENOUS
  Filled 2024-08-19: qty 50

## 2024-08-19 MED ORDER — SODIUM CHLORIDE 0.9 % IV SOLN
INTRAVENOUS | Status: DC
  Filled 2024-08-19: qty 250

## 2024-08-19 NOTE — Assessment & Plan Note (Signed)
#  Neuropathy due to chemotherapy.   Overall improved. She has self discontinued gabapentin , manageable symptoms.

## 2024-08-19 NOTE — Progress Notes (Signed)
 Hematology/Oncology Progress note [phone] [phone]    CHIEF COMPLAINTS/REASON FOR VISIT:  Follow up for serous endometrial cancer  ASSESSMENT & PLAN:   Recurrent carcinoma of endometrium (HCC) Recurrent endometrial cancer, TP 53 + Status post 6 cycles of chemotherapy with carboplatin  and Taxol . Patient with developed recurrent disease, retroperitoneal mass-> biopsy proven recurrence. pMMR status, HER2 1+  Labs are reviewed and discussed with patient.  Proceed with carboplatin  AUC 4.5 Taxol  135mg /m2, add long acting GCSF   C. difficile colitis She finished course of Vancomycin .  Recommend probiotics.  Added GCSF to minimize neutropenia  Neuropathy due to chemotherapeutic drug [facility]) #Neuropathy due to chemotherapy.   Overall improved. She has self discontinued gabapentin , manageable symptoms.   Rheumatoid arthritis [facility]) follow-up rheumatologist.  Recommend patient to discuss with rheumatology to see if she may come off med during treatments.   Hypokalemia Recommend potassium 10meq dialy     No orders of the defined types were placed in this encounter.  Follow-up 3 weeks All questions were answered. The patient knows to call the clinic with any problems, questions or concerns.  [name], MD, PhD [facility] Hematology Oncology 08/19/2024   HISTORY OF PRESENTING ILLNESS:   Sandra Fischer is a  73 y.o.  female presents for high-grade serous endometrial cancer Oncology History  Recurrent carcinoma of endometrium (HCC)  07/15/2019 Initial Diagnosis   Endometrial cancer  Patient had TH/BSO sentinel lymph node biopsy by [facility] GYN oncology Dr. Augustin on 06/21/2019. Extensive medical records from [facility] health system review was performed by me.  Tumor size 9.3 cm, invading 99% of myometrium [3.55 out of 3.6 cm], positive right external iliac sentinel lymph nodes.  Negative washing.  No LVI, serosa is uninvolved. 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 [id number]  FIGO stage IIIC1 MSI intact HER 2 negative.   her case was discussed at [facility] tumor board on 07/04/2019  PORTEC-3 regimen was recommended given subgroup analysis showing benefit in the serous histology  Patient declined treatment.     10/20/2019 Relapse/Recurrence   CT chest abdomen pelvis with contrast showed disease recurrence. There are multiple newly enlarged retroperitoneal and right iliac lymph nodes the largest the left retroperitoneal node measuring 1.9 x 1.6 cm concerning for nodal metastasis disease. Unchanged 4 mm groundglass pulmonary nodule of the left pulmonary apex. This remains nonspecific.    11/07/2019 - 02/20/2020 Chemotherapy   carboplatin  AUC 4.5 and paclitaxel  175 mg/m2 x 2 cycles carboplatin  AUC4.5 and paclitaxel  135 mg/m2 x 4 cycles   03/12/2020 Imaging   CT chest abdomen pelvis with contrast showed 1. The multiple enlarged retroperitoneal and right iliac lymph nodes identified as new on the previous exam from 10/20/2019 have resolved completely in the interval. 2. Tiny ground-glass pulmonary nodule medial left lung apex is stable in the interval. Likely benign, continued attention on follow-up recommended. 3. No new or progressive interval findings. 4.  Aortic Atherosclerois (ICD10-170.0   06/07/2024 Imaging   CT chest abdomen pelvis with contrast showed 1. Newly enlarged left retroperitoneal lymph nodes measuring up to 2.5 x 2.2 cm, consistent with nodal metastatic disease. 2. Occasional tiny pulmonary nodules unchanged, most likely benign and incidental. 3. Status post hysterectomy. 4. Nonobstructive left nephrolithiasis.   06/21/2024 Relapse/Recurrence   Periaortic left side soft tissue mass biopsy showed metastatic poorly differentiated carcinoma.    Diagnosis Note : The specimen demonstrates a pleomorphic high-grade epithelioid proliferation. Immunohistochemical stains were performed to  characterize the tumor cells. The cells  are positive for CK AE1/AE3, GATA3, CDX2, and p40. P53 is mutant type of staining. The cells are negative for PAX8, TTF-1, WT1, ER, -PR, S100 and SOX10. The immunoprofile is not specific and is not indicative of a specific site of primary. The patient's history of endometrial carcinoma is notes. The tumor most likely represents a metastasis from the patient's known endometrial carcinoma since metastatic tumor cells could aberrant loss or gain expression of lineage markers. However, metastasis from other organ system cannot be entirely excluded. Correlation with imaging findings is recommended. Controls worked appropriately.    07/08/2024 -  Chemotherapy   Patient is on Treatment Plan : UTERINE Carboplatin  + Paclitaxel  q21d       INTERVAL HISTORY Sandra Fischer is a 73 y.o. female who has above history reviewed by me today presents for follow up visit for management of endometrial cancer. + Diarrhea due to C diff colitis has completely resolved.   No fever chills.   Review of Systems  Constitutional:  Positive for fatigue. Negative for appetite change, chills and fever.  HENT:   Negative for hearing loss and voice change.   Eyes:  Negative for eye problems.  Respiratory:  Negative for chest tightness and cough.   Cardiovascular:  Negative for chest pain.  Gastrointestinal:  Positive for diarrhea. Negative for abdominal distention, abdominal pain and blood in stool.  Endocrine: Negative for hot flashes.  Genitourinary:  Negative for difficulty urinating and frequency.   Musculoskeletal:  Negative for arthralgias.  Skin:  Negative for itching and rash.  Neurological:  Positive for numbness. Negative for extremity weakness.  Hematological:  Negative for adenopathy.  Psychiatric/Behavioral:  Negative for confusion.     MEDICAL HISTORY:  Past Medical History:  Diagnosis Date   Asthma    Asthma    Asthma exacerbation 08/22/2016   Cancer (HCC)     Collagen vascular disease (HCC)    Diabetes mellitus without complication (HCC)    History of Diabetes    Dyspnea    Environmental allergies    Fibromyalgia    High cholesterol    Hypertension    Hypothyroidism    Iron  deficiency anemia 07/03/2020   Neuropathy    RA (rheumatoid arthritis) (HCC)    Sleep apnea    Thyroid  disease     SURGICAL HISTORY: Past Surgical History:  Procedure Laterality Date   ABDOMINAL HYSTERECTOMY     CATARACT EXTRACTION     CHOLECYSTECTOMY     COLONOSCOPY WITH PROPOFOL  N/A 03/14/2022   Procedure: COLONOSCOPY WITH PROPOFOL ;  Surgeon: Maryruth Ole DASEN, MD;  Location: [facility] ENDOSCOPY;  Service: Endoscopy;  Laterality: N/A;  DM   CYSTOURETHROSCOPY     DIAGNOSTIC LAPAROSCOPY     EYE SURGERY     fibroids removed     PORTA CATH REMOVAL N/A 02/14/2021   Procedure: PORTA CATH REMOVAL;  Surgeon: Marea Selinda RAMAN, MD;  Location: [facility] INVASIVE CV LAB;  Service: Cardiovascular;  Laterality: N/A;   THYROIDECTOMY, PARTIAL     tummy tuck      SOCIAL HISTORY: Social History   Socioeconomic History   Marital status: Married    Spouse name: Cedric    Number of children: Not on file   Years of education: Not on file   Highest education level: Not on file  Occupational History   Occupation: Retired  Tobacco Use   Smoking status: Former    Current packs/day: 0.00    Average packs/day: 0.1 packs/day for 0.2 years    Types:  Cigarettes    Start date: 09/1978    Quit date: 57    Years since quitting: 45.6   Smokeless tobacco: Never  Vaping Use   Vaping status: Never Used  Substance and Sexual Activity   Alcohol use: No   Drug use: No   Sexual activity: Yes    Birth control/protection: Post-menopausal  Other Topics Concern   Not on file  Social History Narrative   Not on file   Social Drivers of Health   Financial Resource Strain: Not on file  Food Insecurity: Not on file  Transportation Needs: Not on file  Physical Activity: Not on file  Stress:  Not on file  Social Connections: Not on file  Intimate Partner Violence: Not on file    FAMILY HISTORY: Family History  Problem Relation Age of Onset   Diabetes Mother    Hypertension Mother    Hyperlipidemia Mother    Dementia Mother    Breast cancer Neg Hx    Ovarian cancer Neg Hx    Colon cancer Neg Hx     ALLERGIES:  is allergic to abatacept, codeine, iodine, latex, shellfish allergy, trimethoprim  hydrochloride [trimethoprim ], and penicillins.  MEDICATIONS:  Current Outpatient Medications  Medication Sig Dispense Refill   Acetaminophen  (TYLENOL  EXTRA STRENGTH PO) Take 1,300 mg by mouth 2 (two) times daily as needed.     ascorbic acid (VITAMIN C) 500 MG tablet Take 500 mg by mouth daily.     aspirin  EC 81 MG tablet Take 81 mg by mouth daily.     azelastine  (ASTELIN ) 0.1 % nasal spray Place 2 sprays into both nostrils 2 (two) times daily. 30 mL 5   Calcium Carbonate-Vit D-Min (CALCIUM 600+D PLUS MINERALS) 600-400 MG-UNIT TABS Take 1 tablet by mouth 2 (two) times daily.     cetirizine  (ZYRTEC ) 10 MG tablet Take 1 tablet (10 mg total) by mouth daily. 30 tablet 0   Cholecalciferol (D3 ADULT PO) Take 1 capsule by mouth daily.     clotrimazole-betamethasone (LOTRISONE) cream Apply topically 4 (four) times daily as needed.     dexamethasone  (DECADRON ) 4 MG tablet Take 2 tablets (8 mg total) by mouth daily for 3 days. Start the day after chemotherapy. Take with food. 30 tablet 1   empagliflozin (JARDIANCE) 25 MG TABS tablet Take 12.5 mg by mouth daily.     EPINEPHrine  0.3 mg/0.3 mL IJ SOAJ injection Use as directed for severe allergic reaction. 2 Device 1   etanercept (ENBREL) 25 MG injection Inject 25 mg into the skin.     fluticasone  (FLONASE ) 50 MCG/ACT nasal spray Place 1 spray into both nostrils 2 (two) times daily. 16 g 0   fluticasone -salmeterol (ADVAIR) 250-50 MCG/ACT AEPB Inhale 1 puff into the lungs in the morning and at bedtime.     furosemide (LASIX) 10 MG/ML solution Take  by mouth daily.     gabapentin  (NEURONTIN ) 300 MG capsule Take 1 capsule (300 mg total) by mouth 2 (two) times daily. 60 capsule 0   hydrALAZINE (APRESOLINE) 25 MG tablet Take 25 mg by mouth 2 (two) times daily.     ipratropium (ATROVENT ) 0.02 % nebulizer solution Take 0.5 mg by nebulization every 4 (four) hours as needed.      leflunomide (ARAVA) 20 MG tablet Take 20 mg by mouth daily.     levothyroxine (SYNTHROID) 75 MCG tablet Take 75 mcg by mouth daily before breakfast.     lisinopril (PRINIVIL,ZESTRIL) 40 MG tablet Take 20 mg by mouth in  the morning and at bedtime.      metoprolol succinate (TOPROL-XL) 50 MG 24 hr tablet Take 50 mg by mouth daily. Take with or immediately following a meal.     montelukast  (SINGULAIR ) 10 MG tablet Take 10 mg by mouth at bedtime.     nitroGLYCERIN (NITROSTAT) 0.4 MG SL tablet Place under the tongue. Place 1 tablet (0.4 mg total) under the tongue every 5 (five) minutes as needed for Chest pain May take up to 3 doses.     nortriptyline (PAMELOR) 25 MG capsule Take 25 mg by mouth at bedtime.     olopatadine  (PATANOL) 0.1 % ophthalmic solution Place 1 drop into both eyes 2 (two) times daily. 5 mL 5   omeprazole (PRILOSEC) 10 MG capsule Take 20 mg by mouth daily.     ondansetron  (ZOFRAN ) 8 MG tablet Take 1 tablet (8 mg total) by mouth every 8 (eight) hours as needed for nausea or vomiting. Start on the third day after chemotherapy. 30 tablet 1   ondansetron  (ZOFRAN -ODT) 4 MG disintegrating tablet Take 1 tablet (4 mg total) by mouth every 8 (eight) hours as needed for nausea or vomiting. 15 tablet 0   potassium chloride  (KLOR-CON  M) 10 MEQ tablet Take 1 tablet (10 mEq total) by mouth daily. 30 tablet 0   pravastatin (PRAVACHOL) 40 MG tablet Take 40 mg by mouth daily.     predniSONE  (DELTASONE ) 5 MG tablet Take 5 mg by mouth daily with breakfast.     predniSONE  (DELTASONE ) 50 MG tablet Take 1 tablet (50 mg total) by mouth See admin instructions. Prednisone  - take 50 mg  by mouth at 13 hours, 7 hours, and 1 hour before contrast media injection 3 tablet 0   Prenatal Vit-Fe Fumarate-FA (MULTIVITAMIN-PRENATAL) 27-0.8 MG TABS tablet Take 1 tablet by mouth daily.      prochlorperazine  (COMPAZINE ) 10 MG tablet Take 1 tablet (10 mg total) by mouth every 6 (six) hours as needed for nausea or vomiting. 30 tablet 1   sulfaSALAzine (AZULFIDINE) 500 MG tablet TAKE ONE TABLET BY MOUTH TWO TIMES A DAY FOR 14 DAYS, THEN TAKE TWO TABLETS TWO TIMES A DAY FOR 14 DAYS, THEN TAKE THREE TABLETS TWO TIMES A DAY FOR 62 DAYS FOR INFLAMMATION. TAKE AFTER MEALS     vancomycin  (VANCOCIN ) 125 MG capsule Take 1 capsule (125 mg total) by mouth 4 (four) times daily. 40 capsule 0   verapamil (CALAN) 120 MG tablet Take 240 mg by mouth 2 (two) times daily.     desloratadine (CLARINEX) 5 MG tablet Take 5 mg by mouth daily. (Patient not taking: Reported on 08/19/2024)     leflunomide (ARAVA) 20 MG tablet Take 1 tablet by mouth daily. (Patient not taking: Reported on 08/19/2024)     senna-docusate (SENOKOT-S) 8.6-50 MG tablet Take 1 tablet by mouth at bedtime as needed for mild constipation. (Patient not taking: Reported on 08/19/2024)     tiotropium (SPIRIVA) 18 MCG inhalation capsule Place 18 mcg into inhaler and inhale daily. (Patient not taking: Reported on 08/19/2024)     No current facility-administered medications for this visit.   Facility-Administered Medications Ordered in Other Visits  Medication Dose Route Frequency Provider Last Rate Last Admin   0.9 %  sodium chloride  infusion   Intravenous Continuous Babara Call, MD   Stopped at 08/19/24 1513     PHYSICAL EXAMINATION: ECOG PERFORMANCE STATUS: 1 - Symptomatic but completely ambulatory  Physical Exam Constitutional:      General: She is  not in acute distress.    Appearance: She is obese.  HENT:     Head: Normocephalic and atraumatic.  Eyes:     General: No scleral icterus. Cardiovascular:     Rate and Rhythm: Normal rate and regular  rhythm.     Heart sounds: Normal heart sounds.  Pulmonary:     Effort: Pulmonary effort is normal. No respiratory distress.     Breath sounds: Normal breath sounds. No wheezing.  Abdominal:     General: Bowel sounds are normal. There is no distension.     Palpations: Abdomen is soft.  Musculoskeletal:        General: Deformity present. Normal range of motion.     Cervical back: Normal range of motion and neck supple.  Skin:    General: Skin is warm and dry.     Findings: No erythema or rash.  Neurological:     Mental Status: She is alert and oriented to person, place, and time. Mental status is at baseline.  Psychiatric:        Mood and Affect: Mood normal.     LABORATORY DATA:  I have reviewed the data as listed    Latest Ref Rng & Units 08/19/2024    8:38 AM 07/29/2024    8:30 AM 07/19/2024   10:49 AM  CBC  WBC 4.0 - 10.5 K/uL 9.1  6.0  3.2   Hemoglobin 12.0 - 15.0 g/dL 87.8  89.0  88.9   Hematocrit 36.0 - 46.0 % 39.6  35.9  35.5   Platelets 150 - 400 K/uL 263  193  144       Latest Ref Rng & Units 08/19/2024    8:38 AM 07/29/2024    8:30 AM 07/19/2024   10:49 AM  CMP  Glucose 70 - 99 mg/dL 851  848  871   BUN 8 - 23 mg/dL 17  14  6    Creatinine 0.44 - 1.00 mg/dL 9.28  9.20  9.10   Sodium 135 - 145 mmol/L 138  139  132   Potassium 3.5 - 5.1 mmol/L 3.4  3.7  3.6   Chloride 98 - 111 mmol/L 106  107  99   CO2 22 - 32 mmol/L 22  23  24    Calcium 8.9 - 10.3 mg/dL 9.0  8.9  8.5   Total Protein 6.5 - 8.1 g/dL 7.7  7.1  7.0   Total Bilirubin 0.0 - 1.2 mg/dL 0.8  0.5  0.9   Alkaline Phos 38 - 126 U/L 74  75  55   AST 15 - 41 U/L 17  21  23    ALT 0 - 44 U/L 11  13  22       Iron /TIBC/Ferritin/ %Sat    Component Value Date/Time   IRON  43 01/02/2021 1331   TIBC 200 (L) 01/02/2021 1331   FERRITIN 759 (H) 01/02/2021 1331   IRONPCTSAT 22 01/02/2021 1331      RADIOGRAPHIC STUDIES: I have personally reviewed the radiological images as listed and agreed with the findings in  the report., CT ABDOMINAL MASS BIOPSY Result Date: 06/21/2024 INDICATION: Retroperitoneal mass EXAM: CT-guided biopsy TECHNIQUE: Multidetector CT imaging of the abdomen was performed following the standard protocol with/without IV contrast. RADIATION DOSE REDUCTION: This exam was performed according to the departmental dose-optimization program which includes automated exposure control, adjustment of the mA and/or kV according to patient size and/or use of iterative reconstruction technique. MEDICATIONS: None. ANESTHESIA/SEDATION: Moderate (conscious) sedation was employed during  this procedure. A total of Versed  2 mg and Fentanyl  100 mcg was administered intravenously by the radiology nurse. Total intra-service moderate Sedation Time: 35 minutes. The patient's level of consciousness and vital signs were monitored continuously by radiology nursing throughout the procedure under my direct supervision. COMPLICATIONS: None immediate. PROCEDURE: Informed written consent was obtained from the patient after a thorough discussion of the procedural risks, benefits and alternatives. All questions were addressed. Maximal Sterile Barrier Technique was utilized including caps, mask, sterile gowns, sterile gloves, sterile drape, hand hygiene and skin antiseptic. A timeout was performed prior to the initiation of the procedure. With patient in a prone position and markers on the the rectal lumbar spine, initial axial images were obtained. The retroperitoneal Peri aortic mass is again identified. The patient's skin was prepped and draped in the usual sterile fashion. Local Anesthesia was achieved by infiltrating subcutaneous tissue with 1% lidocaine  from the skin to the paraspinous muscles. A small incision was then made. He an introducer needle was then advanced in sequential fashion with intermittent imaging until the needle tip was identified at the retroperitoneal lymph node. The needle was removed leaving the cannula behind  and a BioPince needle was then advanced and multiple samples were obtained and placed in formalin. Final images demonstrate no active hemorrhage. The cannula and all needles removed from the patient. Sterile dressing applied. IMPRESSION: Satisfactory CT-guided core needle biopsy of a retroperitoneal left-sided periaortic lymph node. Electronically Signed   By: Cordella Banner   On: 06/21/2024 13:33   CT CHEST ABDOMEN PELVIS W CONTRAST Result Date: 06/09/2024 CLINICAL DATA:  Endometrial cancer restaging * Tracking Code: BO * EXAM: CT CHEST, ABDOMEN, AND PELVIS WITH CONTRAST TECHNIQUE: Multidetector CT imaging of the chest, abdomen and pelvis was performed following the standard protocol during bolus administration of intravenous contrast. RADIATION DOSE REDUCTION: This exam was performed according to the departmental dose-optimization program which includes automated exposure control, adjustment of the mA and/or kV according to patient size and/or use of iterative reconstruction technique. CONTRAST:  OMNIPAQUE  IOHEXOL  300 MG/ML  SOLN COMPARISON:  04/29/2023 FINDINGS: CT CHEST FINDINGS Cardiovascular: Aortic atherosclerosis. Normal heart size. No pericardial effusion. Mediastinum/Nodes: No enlarged mediastinal, hilar, or axillary lymph nodes. Thyroid  gland, trachea, and esophagus demonstrate no significant findings. Lungs/Pleura: Mild diffuse bilateral bronchial wall thickening. Occasional tiny pulmonary nodules unchanged, for example in the dependent right lower lobe measuring 0.3 cm (series 4, image 76) and in the right apex measuring 0.2 cm (series 4, image 19). No pleural effusion or pneumothorax. Musculoskeletal: No chest wall abnormality. No acute osseous findings. CT ABDOMEN PELVIS FINDINGS Hepatobiliary: No focal liver abnormality is seen. Status post cholecystectomy. No biliary dilatation. Pancreas: Unremarkable. No pancreatic ductal dilatation or surrounding inflammatory changes. Spleen: Normal in  size without significant abnormality. Adrenals/Urinary Tract: Adrenal glands are unremarkable. Small nonobstructive calculus of the inferior pole of the left kidney. No right-sided calculi, ureteral calculi, or hydronephrosis. Bladder is unremarkable. Stomach/Bowel: Stomach is within normal limits. Appendix appears normal. No evidence of bowel wall thickening, distention, or inflammatory changes. Descending colonic diverticulosis. Vascular/Lymphatic: Aortic atherosclerosis. Newly enlarged left retroperitoneal lymph nodes measuring up to 2.5 x 2.2 cm (series 2, image 61). Reproductive: Status post hysterectomy. Other: Small fat containing midline ventral hernia (series 2, image 81). No ascites. Musculoskeletal: No acute osseous findings. IMPRESSION: 1. Newly enlarged left retroperitoneal lymph nodes measuring up to 2.5 x 2.2 cm, consistent with nodal metastatic disease. 2. Occasional tiny pulmonary nodules unchanged, most likely benign and incidental.  3. Status post hysterectomy. 4. Nonobstructive left nephrolithiasis. Aortic Atherosclerosis (ICD10-I70.0). Electronically Signed   By: Marolyn JONETTA Jaksch M.D.   On: 06/09/2024 22:01

## 2024-08-19 NOTE — Assessment & Plan Note (Signed)
 She finished course of Vancomycin .  Recommend probiotics.  Added GCSF to minimize neutropenia

## 2024-08-19 NOTE — Patient Instructions (Signed)

## 2024-08-19 NOTE — Assessment & Plan Note (Addendum)
 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 Patient with developed recurrent disease, retroperitoneal mass-> biopsy proven recurrence. pMMR status, HER2 1+  Labs are reviewed and discussed with patient.  Proceed with carboplatin  AUC 4.5 Taxol  135mg /m2, add long acting GCSF

## 2024-08-19 NOTE — Assessment & Plan Note (Signed)
 follow-up rheumatologist.  Recommend patient to discuss with rheumatology to see if she may come off med during treatments.

## 2024-08-19 NOTE — Assessment & Plan Note (Signed)
 Recommend potassium 10meq dialy

## 2024-08-23 ENCOUNTER — Telehealth: Payer: Self-pay | Admitting: *Deleted

## 2024-08-23 NOTE — Telephone Encounter (Signed)
 Dr. Babara recommends [facility] for further evaluation.

## 2024-08-23 NOTE — Telephone Encounter (Signed)
 I called patient to further discuss her symptoms. She stated that its over all joint pain. I reviewed pt's chart. Pt received onpro injection on Friday. I asked patient if she took any Claritin  after her injection. She stated that she forgot about taking the Claritin . She wants to take this tonight and for the next 5 days. I did offer the smc apt, but pt declined and wants to try the Claritin  first.   She has been taking prednisone  taper dose from the RA provider and she is feeling some better.  She also stated that she is waiting on a nephrologist referral.

## 2024-08-23 NOTE — Telephone Encounter (Signed)
 Patient called to report that since receiving her treatment last Friday she is not feeling well, all of her joints are aching.

## 2024-08-24 NOTE — Telephone Encounter (Signed)
 Dr. Babara please advise on Nephrology referral.

## 2024-08-25 ENCOUNTER — Ambulatory Visit: Admitting: Podiatry

## 2024-08-25 NOTE — Telephone Encounter (Signed)
 No referral for Nephrology has been requested by Dr. Babara. Spoke to pt for clarification and she states that she spoke to the [facility] regarding a nephrology referral and they recommended for her to establish care locally that's why she wanted Dr. Babara to send referral. Informed pt that Dr. Babara has reviewed kidney function and it is stable. Nephorology referral can be discussed at next visit. Pt verbalized understanding.

## 2024-08-30 ENCOUNTER — Encounter: Payer: Self-pay | Admitting: Oncology

## 2024-09-01 ENCOUNTER — Other Ambulatory Visit: Payer: Self-pay

## 2024-09-01 ENCOUNTER — Encounter: Payer: Self-pay | Admitting: Oncology

## 2024-09-01 ENCOUNTER — Telehealth: Payer: Self-pay | Admitting: *Deleted

## 2024-09-01 MED ORDER — NYSTATIN 100000 UNIT/GM EX POWD: CUTANEOUS | 2 refills | Status: DC

## 2024-09-01 NOTE — Telephone Encounter (Signed)
 Per Dr. Babara, she suspects this is a possible fungal infection. Nystatin  powder rx sent in to [facility] for patient to apply 2-3 times a day. Pt informed of this and was advised to call back if rash worsens or does not get better. Pt verbalized understanding.

## 2024-09-01 NOTE — Telephone Encounter (Signed)
 Patient called to report that she has developed a rash under her breast and in her groin area. She would like a call back.

## 2024-09-06 ENCOUNTER — Telehealth: Payer: Self-pay | Admitting: *Deleted

## 2024-09-06 NOTE — Telephone Encounter (Signed)
 Contacted patient to gather more information regarding infection. Pt states that patient has a vaginal yeast infection, for which she took one dose of Diflucan  and it has not cleared up. She also tried Nystatin  powder and it stopped the itching but not the infection. Pt would like to know if she needs to be seen or what she needs to do for this because nothing has worked. Please advise.

## 2024-09-06 NOTE — Telephone Encounter (Signed)
 Patient returned my phone call. I spoke with patient. Offered her apts in [facility] today at 3pm or on Thursday. Pt declined as she has someone coming to her home today and Thursday for her garage door maintenance. Pt has apts already scheduled on Friday with Dr. Babara for her treatment.  Of note, she stated that she was dx today with acute bronchitis at [facility]. Her provider prescribed doxycyline but she wanted to let Dr. Babara know that she would not be taking this prescription as she feels like her symptoms are being managed by otc meds/nasal sprays.

## 2024-09-06 NOTE — Telephone Encounter (Signed)
 Dr. Babara recommends for patient to see Tinnie in [facility] for further evaluation.

## 2024-09-06 NOTE — Telephone Encounter (Signed)
 Patient called stating she gave her 1 tablet to take care of her infection and it is not gone.  She wants someone to give her a call so what they can give her something to help  the infection .

## 2024-09-09 ENCOUNTER — Inpatient Hospital Stay: Attending: Oncology

## 2024-09-09 ENCOUNTER — Inpatient Hospital Stay

## 2024-09-09 ENCOUNTER — Inpatient Hospital Stay (HOSPITAL_BASED_OUTPATIENT_CLINIC_OR_DEPARTMENT_OTHER): Admitting: Oncology

## 2024-09-09 ENCOUNTER — Encounter: Payer: Self-pay | Admitting: Oncology

## 2024-09-09 VITALS — BP 146/68 | HR 68 | Temp 97.9°F | Resp 18 | Wt 206.0 lb

## 2024-09-09 DIAGNOSIS — J209 Acute bronchitis, unspecified: Secondary | ICD-10-CM | POA: Diagnosis not present

## 2024-09-09 DIAGNOSIS — M069 Rheumatoid arthritis, unspecified: Secondary | ICD-10-CM

## 2024-09-09 DIAGNOSIS — Z87891 Personal history of nicotine dependence: Secondary | ICD-10-CM | POA: Diagnosis not present

## 2024-09-09 DIAGNOSIS — B372 Candidiasis of skin and nail: Secondary | ICD-10-CM | POA: Insufficient documentation

## 2024-09-09 DIAGNOSIS — G620 Drug-induced polyneuropathy: Secondary | ICD-10-CM | POA: Diagnosis not present

## 2024-09-09 DIAGNOSIS — C541 Malignant neoplasm of endometrium: Secondary | ICD-10-CM | POA: Diagnosis present

## 2024-09-09 DIAGNOSIS — B3731 Acute candidiasis of vulva and vagina: Secondary | ICD-10-CM

## 2024-09-09 LAB — CBC WITH DIFFERENTIAL (CANCER CENTER ONLY)
Abs Immature Granulocytes: 0.12 K/uL — ABNORMAL HIGH (ref 0.00–0.07)
Basophils Absolute: 0.1 K/uL (ref 0.0–0.1)
Basophils Relative: 1 %
Eosinophils Absolute: 0.3 K/uL (ref 0.0–0.5)
Eosinophils Relative: 3 %
HCT: 39.2 % (ref 36.0–46.0)
Hemoglobin: 11.9 g/dL — ABNORMAL LOW (ref 12.0–15.0)
Immature Granulocytes: 1 %
Lymphocytes Relative: 35 %
Lymphs Abs: 3.3 K/uL (ref 0.7–4.0)
MCH: 27 pg (ref 26.0–34.0)
MCHC: 30.4 g/dL (ref 30.0–36.0)
MCV: 89.1 fL (ref 80.0–100.0)
Monocytes Absolute: 0.7 K/uL (ref 0.1–1.0)
Monocytes Relative: 8 %
Neutro Abs: 5 K/uL (ref 1.7–7.7)
Neutrophils Relative %: 52 %
Platelet Count: 278 K/uL (ref 150–400)
RBC: 4.4 MIL/uL (ref 3.87–5.11)
RDW: 18.6 % — ABNORMAL HIGH (ref 11.5–15.5)
WBC Count: 9.5 K/uL (ref 4.0–10.5)
nRBC: 0 % (ref 0.0–0.2)

## 2024-09-09 LAB — CMP (CANCER CENTER ONLY)
ALT: 15 U/L (ref 0–44)
AST: 20 U/L (ref 15–41)
Albumin: 3.9 g/dL (ref 3.5–5.0)
Alkaline Phosphatase: 88 U/L (ref 38–126)
Anion gap: 8 (ref 5–15)
BUN: 16 mg/dL (ref 8–23)
CO2: 24 mmol/L (ref 22–32)
Calcium: 9.1 mg/dL (ref 8.9–10.3)
Chloride: 108 mmol/L (ref 98–111)
Creatinine: 0.86 mg/dL (ref 0.44–1.00)
GFR, Estimated: >60 mL/min (ref >60)
Glucose, Bld: 169 mg/dL — ABNORMAL HIGH (ref 70–99)
Potassium: 3.6 mmol/L (ref 3.5–5.1)
Sodium: 140 mmol/L (ref 135–145)
Total Bilirubin: 0.6 mg/dL (ref 0.0–1.2)
Total Protein: 7.3 g/dL (ref 6.5–8.1)

## 2024-09-09 NOTE — Assessment & Plan Note (Addendum)
 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 Patient with developed recurrent disease, retroperitoneal mass-> biopsy proven recurrence. pMMR status, HER2 1+  Labs are reviewed and discussed with patient.  Hold cycle 3 carboplatin  AUC 4.5 Taxol  135mg /m2,with GCSF due to acute infection.

## 2024-09-09 NOTE — Assessment & Plan Note (Signed)
 Off RA med while on chemotherapy.  Recommend patient to follow up with rheumatology

## 2024-09-09 NOTE — Assessment & Plan Note (Signed)
 She will see Lauren [facility] next week.

## 2024-09-09 NOTE — Progress Notes (Signed)
 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 Patient with developed recurrent disease, retroperitoneal mass-> biopsy proven recurrence. pMMR status, HER2 1+  Labs are reviewed and discussed with patient.  Hold cycle 3 carboplatin  AUC 4.5 Taxol  135mg /m2,with GCSF due to acute infection.   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   Overall improved. She has self discontinued gabapentin , manageable symptoms.   Rheumatoid arthritis (HCC) Off RA med while on chemotherapy.  Recommend patient to follow up with rheumatology   Acute bronchitis Ongoing symptoms.  Recommend patient to finish course of doxycycline .  Oral probiotics given recent C diff. History.   Vaginal candidiasis She will see Lauren [facility] next week.      Orders Placed This Encounter  Procedures   CBC with Differential (Cancer Center Only)    Standing Status:   Future    Expected Date:   09/23/2024    Expiration Date:   09/23/2025   CMP (Cancer Center only)    Standing Status:   Future    Expected Date:   09/23/2024    Expiration Date:   09/23/2025   CA 125    Standing Status:   Future    Expected Date:   09/23/2024    Expiration Date:   09/23/2025   Follow-up 3 weeks All questions were answered. The patient knows to call the clinic with any problems, questions or concerns.  [name], MD, PhD [facility] Hematology Oncology 09/09/2024   HISTORY OF PRESENTING ILLNESS:   Sandra Fischer is a  73 y.o.  female presents for high-grade serous endometrial cancer Oncology History  Recurrent carcinoma of endometrium (HCC)  07/15/2019 Initial Diagnosis   Endometrial cancer  Patient had TH/BSO sentinel  lymph node biopsy by [facility] oncology Dr. Augustin on 06/21/2019. Extensive medical records from [facility] health system review was performed by me.  Tumor size 9.3 cm, invading 99% of myometrium [3.55 out of 3.6 cm], positive right external iliac sentinel lymph nodes.  Negative washing.  No LVI, serosa is uninvolved. 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 [id number]  FIGO stage IIIC1 MSI intact HER 2 negative.   her case was discussed at [facility] tumor board on 07/04/2019  PORTEC-3 regimen was recommended given subgroup analysis showing benefit in the serous histology  Patient declined treatment.     10/20/2019 Relapse/Recurrence   CT chest abdomen pelvis with contrast showed disease recurrence. There are multiple newly enlarged retroperitoneal and right iliac lymph nodes the largest the left retroperitoneal node measuring 1.9 x 1.6 cm concerning for nodal metastasis disease. Unchanged 4 mm groundglass pulmonary nodule of the left pulmonary apex. This remains nonspecific.    11/07/2019 - 02/20/2020 Chemotherapy   carboplatin  AUC 4.5 and paclitaxel  175 mg/m2 x 2 cycles carboplatin  AUC4.5 and paclitaxel  135 mg/m2 x 4 cycles   03/12/2020 Imaging   CT chest abdomen pelvis with contrast showed 1. The multiple enlarged retroperitoneal and right iliac lymph nodes identified as new on the previous exam from 10/20/2019 have resolved completely in the interval. 2. Tiny ground-glass pulmonary nodule medial left lung apex is stable in the interval. Likely benign, continued attention on follow-up recommended. 3. No new or progressive interval findings. 4.  Aortic Atherosclerois (ICD10-170.0   06/07/2024  Imaging   CT chest abdomen pelvis with contrast showed 1. Newly enlarged left retroperitoneal lymph nodes measuring up to 2.5 x 2.2 cm, consistent with nodal metastatic disease. 2. Occasional tiny pulmonary nodules unchanged, most likely benign and  incidental. 3. Status post hysterectomy. 4. Nonobstructive left nephrolithiasis.   06/21/2024 Relapse/Recurrence   Periaortic left side soft tissue mass biopsy showed metastatic poorly differentiated carcinoma.    Diagnosis Note : The specimen demonstrates a pleomorphic high-grade epithelioid proliferation. Immunohistochemical stains were performed to characterize the tumor cells. The cells are positive for CK AE1/AE3, GATA3, CDX2, and p40. P53 is mutant type of staining. The cells are negative for PAX8, TTF-1, WT1, ER, -PR, S100 and SOX10. The immunoprofile is not specific and is not indicative of a specific site of primary. The patient's history of endometrial carcinoma is notes. The tumor most likely represents a metastasis from the patient's known endometrial carcinoma since metastatic tumor cells could aberrant loss or gain expression of lineage markers. However, metastasis from other organ system cannot be entirely excluded. Correlation with imaging findings is recommended. Controls worked appropriately.    07/08/2024 -  Chemotherapy   Patient is on Treatment Plan : UTERINE Carboplatin  + Paclitaxel  q21d       INTERVAL HISTORY Sandra Fischer is a 73 y.o. female who has above history reviewed by me today presents for follow up visit for management of endometrial cancer. She had sore throat, cough congestion and was seen bu urgent care. Per patient, influenza/Covid negative. Results were not available to me. She was diagnosed with acute bacteria bronchitis and prescribed a course of doxycycline . She elected not to take antibiotics. Today she still has ongoing cough, congestion. No fever or chills.   No fever chills. Continues to have vaginal issue.  She declined appointment to see [facility] with Lauren earlier this week.   She was previously on Enbrel and Arava and is currently off RA medication. Per patient she called her rheumatologist several times and did not get call back.  RA symptoms are stable  at baseline.   Review of Systems  Constitutional:  Positive for fatigue. Negative for appetite change, chills and fever.  HENT:   Negative for hearing loss and voice change.   Eyes:  Negative for eye problems.  Respiratory:  Negative for chest tightness and cough.   Cardiovascular:  Negative for chest pain.  Gastrointestinal:  Positive for diarrhea. Negative for abdominal distention, abdominal pain and blood in stool.  Endocrine: Negative for hot flashes.  Genitourinary:  Positive for vaginal discharge. Negative for difficulty urinating and frequency.   Musculoskeletal:  Negative for arthralgias.  Skin:  Negative for itching and rash.  Neurological:  Positive for numbness. Negative for extremity weakness.  Hematological:  Negative for adenopathy.  Psychiatric/Behavioral:  Negative for confusion.     MEDICAL HISTORY:  Past Medical History:  Diagnosis Date   Asthma    Asthma    Asthma exacerbation 08/22/2016   Cancer (HCC)    Collagen vascular disease (HCC)    Diabetes mellitus without complication (HCC)    History of Diabetes    Dyspnea    Environmental allergies    Fibromyalgia    High cholesterol    Hypertension    Hypothyroidism    Iron  deficiency anemia 07/03/2020   Neuropathy    RA (rheumatoid arthritis) (HCC)    Sleep apnea    Thyroid  disease     SURGICAL HISTORY: Past Surgical History:  Procedure Laterality Date   ABDOMINAL HYSTERECTOMY  CATARACT EXTRACTION     CHOLECYSTECTOMY     COLONOSCOPY WITH PROPOFOL  N/A 03/14/2022   Procedure: COLONOSCOPY WITH PROPOFOL ;  Surgeon: Maryruth Ole DASEN, MD;  Location: [facility] ENDOSCOPY;  Service: Endoscopy;  Laterality: N/A;  DM   CYSTOURETHROSCOPY     DIAGNOSTIC LAPAROSCOPY     EYE SURGERY     fibroids removed     PORTA CATH REMOVAL N/A 02/14/2021   Procedure: PORTA CATH REMOVAL;  Surgeon: Marea Selinda RAMAN, MD;  Location: [facility] INVASIVE CV LAB;  Service: Cardiovascular;  Laterality: N/A;   THYROIDECTOMY, PARTIAL     tummy  tuck      SOCIAL HISTORY: Social History   Socioeconomic History   Marital status: Married    Spouse name: Cedric    Number of children: Not on file   Years of education: Not on file   Highest education level: Not on file  Occupational History   Occupation: Retired  Tobacco Use   Smoking status: Former    Current packs/day: 0.00    Average packs/day: 0.1 packs/day for 0.2 years    Types: Cigarettes    Start date: 09/1978    Quit date: 1980    Years since quitting: 45.7   Smokeless tobacco: Never  Vaping Use   Vaping status: Never Used  Substance and Sexual Activity   Alcohol use: No   Drug use: No   Sexual activity: Yes    Birth control/protection: Post-menopausal  Other Topics Concern   Not on file  Social History Narrative   Not on file   Social Drivers of Health   Financial Resource Strain: Not on file  Food Insecurity: Not on file  Transportation Needs: Not on file  Physical Activity: Not on file  Stress: Not on file  Social Connections: Not on file  Intimate Partner Violence: Not on file    FAMILY HISTORY: Family History  Problem Relation Age of Onset   Diabetes Mother    Hypertension Mother    Hyperlipidemia Mother    Dementia Mother    Breast cancer Neg Hx    Ovarian cancer Neg Hx    Colon cancer Neg Hx     ALLERGIES:  is allergic to abatacept, codeine, iodine, latex, shellfish allergy, trimethoprim  hydrochloride [trimethoprim ], and penicillins.  MEDICATIONS:  Current Outpatient Medications  Medication Sig Dispense Refill   Acetaminophen  (TYLENOL  EXTRA STRENGTH PO) Take 1,300 mg by mouth 2 (two) times daily as needed.     ascorbic acid (VITAMIN C) 500 MG tablet Take 500 mg by mouth daily.     aspirin  EC 81 MG tablet Take 81 mg by mouth daily.     azelastine  (ASTELIN ) 0.1 % nasal spray Place 2 sprays into both nostrils 2 (two) times daily. 30 mL 5   Calcium Carbonate-Vit D-Min (CALCIUM 600+D PLUS MINERALS) 600-400 MG-UNIT TABS Take 1 tablet by  mouth 2 (two) times daily.     cetirizine  (ZYRTEC ) 10 MG tablet Take 1 tablet (10 mg total) by mouth daily. 30 tablet 0   Cholecalciferol (D3 ADULT PO) Take 1 capsule by mouth daily.     clotrimazole-betamethasone (LOTRISONE) cream Apply topically 4 (four) times daily as needed.     dexamethasone  (DECADRON ) 4 MG tablet Take 2 tablets (8 mg total) by mouth daily for 3 days. Start the day after chemotherapy. Take with food. 30 tablet 1   doxycycline  (VIBRAMYCIN ) 100 MG capsule Take 100 mg by mouth.     empagliflozin (JARDIANCE) 25 MG TABS tablet Take 12.5  mg by mouth daily.     EPINEPHrine  0.3 mg/0.3 mL IJ SOAJ injection Use as directed for severe allergic reaction. 2 Device 1   etanercept (ENBREL) 25 MG injection Inject 25 mg into the skin.     fluticasone  (FLONASE ) 50 MCG/ACT nasal spray Place 1 spray into both nostrils 2 (two) times daily. 16 g 0   fluticasone -salmeterol (ADVAIR) 250-50 MCG/ACT AEPB Inhale 1 puff into the lungs in the morning and at bedtime.     furosemide (LASIX) 10 MG/ML solution Take by mouth daily.     gabapentin  (NEURONTIN ) 300 MG capsule Take 1 capsule (300 mg total) by mouth 2 (two) times daily. 60 capsule 0   hydrALAZINE (APRESOLINE) 25 MG tablet Take 25 mg by mouth 2 (two) times daily.     ipratropium (ATROVENT ) 0.02 % nebulizer solution Take 0.5 mg by nebulization every 4 (four) hours as needed.      leflunomide (ARAVA) 20 MG tablet Take 20 mg by mouth daily.     leflunomide (ARAVA) 20 MG tablet Take 1 tablet by mouth daily.     levothyroxine (SYNTHROID) 75 MCG tablet Take 75 mcg by mouth daily before breakfast.     lisinopril (PRINIVIL,ZESTRIL) 40 MG tablet Take 20 mg by mouth in the morning and at bedtime.      metoprolol succinate (TOPROL-XL) 50 MG 24 hr tablet Take 50 mg by mouth daily. Take with or immediately following a meal.     montelukast  (SINGULAIR ) 10 MG tablet Take 10 mg by mouth at bedtime.     nitroGLYCERIN (NITROSTAT) 0.4 MG SL tablet Place under the  tongue. Place 1 tablet (0.4 mg total) under the tongue every 5 (five) minutes as needed for Chest pain May take up to 3 doses.     nortriptyline (PAMELOR) 25 MG capsule Take 25 mg by mouth at bedtime.     nystatin  (MYCOSTATIN /NYSTOP ) powder Apply small amount to affected area 2-3 times a day until healed. 60 g 2   olopatadine  (PATANOL) 0.1 % ophthalmic solution Place 1 drop into both eyes 2 (two) times daily. 5 mL 5   omeprazole (PRILOSEC) 10 MG capsule Take 20 mg by mouth daily.     ondansetron  (ZOFRAN ) 8 MG tablet Take 1 tablet (8 mg total) by mouth every 8 (eight) hours as needed for nausea or vomiting. Start on the third day after chemotherapy. 30 tablet 1   ondansetron  (ZOFRAN -ODT) 4 MG disintegrating tablet Take 1 tablet (4 mg total) by mouth every 8 (eight) hours as needed for nausea or vomiting. 15 tablet 0   potassium chloride  (KLOR-CON  M) 10 MEQ tablet Take 1 tablet (10 mEq total) by mouth daily. 30 tablet 0   pravastatin (PRAVACHOL) 40 MG tablet Take 40 mg by mouth daily.     predniSONE  (DELTASONE ) 5 MG tablet Take 5 mg by mouth daily with breakfast.     predniSONE  (DELTASONE ) 50 MG tablet Take 1 tablet (50 mg total) by mouth See admin instructions. Prednisone  - take 50 mg by mouth at 13 hours, 7 hours, and 1 hour before contrast media injection 3 tablet 0   Prenatal Vit-Fe Fumarate-FA (MULTIVITAMIN-PRENATAL) 27-0.8 MG TABS tablet Take 1 tablet by mouth daily.      prochlorperazine  (COMPAZINE ) 10 MG tablet Take 1 tablet (10 mg total) by mouth every 6 (six) hours as needed for nausea or vomiting. 30 tablet 1   sulfaSALAzine (AZULFIDINE) 500 MG tablet TAKE ONE TABLET BY MOUTH TWO TIMES A DAY FOR 14 DAYS,  THEN TAKE TWO TABLETS TWO TIMES A DAY FOR 14 DAYS, THEN TAKE THREE TABLETS TWO TIMES A DAY FOR 62 DAYS FOR INFLAMMATION. TAKE AFTER MEALS     verapamil (CALAN) 120 MG tablet Take 240 mg by mouth 2 (two) times daily.     desloratadine (CLARINEX) 5 MG tablet Take 5 mg by mouth daily. (Patient  not taking: Reported on 09/09/2024)     senna-docusate (SENOKOT-S) 8.6-50 MG tablet Take 1 tablet by mouth at bedtime as needed for mild constipation. (Patient not taking: Reported on 09/09/2024)     tiotropium (SPIRIVA) 18 MCG inhalation capsule Place 18 mcg into inhaler and inhale daily. (Patient not taking: Reported on 09/09/2024)     No current facility-administered medications for this visit.     PHYSICAL EXAMINATION: ECOG PERFORMANCE STATUS: 1 - Symptomatic but completely ambulatory  Physical Exam Constitutional:      General: She is not in acute distress.    Appearance: She is obese.  HENT:     Head: Normocephalic and atraumatic.  Eyes:     General: No scleral icterus. Cardiovascular:     Rate and Rhythm: Normal rate and regular rhythm.     Heart sounds: Normal heart sounds.  Pulmonary:     Effort: Pulmonary effort is normal. No respiratory distress.     Breath sounds: Normal breath sounds. No wheezing.  Abdominal:     General: Bowel sounds are normal. There is no distension.     Palpations: Abdomen is soft.  Musculoskeletal:        General: Deformity present. Normal range of motion.     Cervical back: Normal range of motion and neck supple.  Skin:    General: Skin is warm and dry.     Findings: No erythema or rash.  Neurological:     Mental Status: She is alert and oriented to person, place, and time. Mental status is at baseline.  Psychiatric:     Comments: Feel frustrated.      LABORATORY DATA:  I have reviewed the data as listed    Latest Ref Rng & Units 09/09/2024    8:31 AM 08/19/2024    8:38 AM 07/29/2024    8:30 AM  CBC  WBC 4.0 - 10.5 K/uL 9.5  9.1  6.0   Hemoglobin 12.0 - 15.0 g/dL 88.0  87.8  89.0   Hematocrit 36.0 - 46.0 % 39.2  39.6  35.9   Platelets 150 - 400 K/uL 278  263  193       Latest Ref Rng & Units 09/09/2024    8:31 AM 08/19/2024    8:38 AM 07/29/2024    8:30 AM  CMP  Glucose 70 - 99 mg/dL 830  851  848   BUN 8 - 23 mg/dL 16  17  14     Creatinine 0.44 - 1.00 mg/dL 9.13  9.28  9.20   Sodium 135 - 145 mmol/L 140  138  139   Potassium 3.5 - 5.1 mmol/L 3.6  3.4  3.7   Chloride 98 - 111 mmol/L 108  106  107   CO2 22 - 32 mmol/L 24  22  23    Calcium 8.9 - 10.3 mg/dL 9.1  9.0  8.9   Total Protein 6.5 - 8.1 g/dL 7.3  7.7  7.1   Total Bilirubin 0.0 - 1.2 mg/dL 0.6  0.8  0.5   Alkaline Phos 38 - 126 U/L 88  74  75   AST 15 - 41  U/L 20  17  21    ALT 0 - 44 U/L 15  11  13       Iron /TIBC/Ferritin/ %Sat    Component Value Date/Time   IRON  43 01/02/2021 1331   TIBC 200 (L) 01/02/2021 1331   FERRITIN 759 (H) 01/02/2021 1331   IRONPCTSAT 22 01/02/2021 1331      RADIOGRAPHIC STUDIES: I have personally reviewed the radiological images as listed and agreed with the findings in the report., CT ABDOMINAL MASS BIOPSY Result Date: 06/21/2024 INDICATION: Retroperitoneal mass EXAM: CT-guided biopsy TECHNIQUE: Multidetector CT imaging of the abdomen was performed following the standard protocol with/without IV contrast. RADIATION DOSE REDUCTION: This exam was performed according to the departmental dose-optimization program which includes automated exposure control, adjustment of the mA and/or kV according to patient size and/or use of iterative reconstruction technique. MEDICATIONS: None. ANESTHESIA/SEDATION: Moderate (conscious) sedation was employed during this procedure. A total of Versed  2 mg and Fentanyl  100 mcg was administered intravenously by the radiology nurse. Total intra-service moderate Sedation Time: 35 minutes. The patient's level of consciousness and vital signs were monitored continuously by radiology nursing throughout the procedure under my direct supervision. COMPLICATIONS: None immediate. PROCEDURE: Informed written consent was obtained from the patient after a thorough discussion of the procedural risks, benefits and alternatives. All questions were addressed. Maximal Sterile Barrier Technique was utilized including caps,  mask, sterile gowns, sterile gloves, sterile drape, hand hygiene and skin antiseptic. A timeout was performed prior to the initiation of the procedure. With patient in a prone position and markers on the the rectal lumbar spine, initial axial images were obtained. The retroperitoneal Peri aortic mass is again identified. The patient's skin was prepped and draped in the usual sterile fashion. Local Anesthesia was achieved by infiltrating subcutaneous tissue with 1% lidocaine  from the skin to the paraspinous muscles. A small incision was then made. He an introducer needle was then advanced in sequential fashion with intermittent imaging until the needle tip was identified at the retroperitoneal lymph node. The needle was removed leaving the cannula behind and a BioPince needle was then advanced and multiple samples were obtained and placed in formalin. Final images demonstrate no active hemorrhage. The cannula and all needles removed from the patient. Sterile dressing applied. IMPRESSION: Satisfactory CT-guided core needle biopsy of a retroperitoneal left-sided periaortic lymph node. Electronically Signed   By: Cordella Banner   On: 06/21/2024 13:33

## 2024-09-09 NOTE — Assessment & Plan Note (Signed)
#  Neuropathy due to chemotherapy.   Overall improved. She has self discontinued gabapentin , manageable symptoms.

## 2024-09-09 NOTE — Assessment & Plan Note (Signed)
 Ongoing symptoms.  Recommend patient to finish course of doxycycline .  Oral probiotics given recent C diff. History.

## 2024-09-13 ENCOUNTER — Inpatient Hospital Stay (HOSPITAL_BASED_OUTPATIENT_CLINIC_OR_DEPARTMENT_OTHER): Admitting: Nurse Practitioner

## 2024-09-13 ENCOUNTER — Encounter: Payer: Self-pay | Admitting: Nurse Practitioner

## 2024-09-13 ENCOUNTER — Other Ambulatory Visit: Payer: Self-pay

## 2024-09-13 VITALS — BP 116/59 | HR 65 | Temp 97.8°F | Resp 18 | Ht 62.0 in | Wt 207.0 lb

## 2024-09-13 DIAGNOSIS — B3731 Acute candidiasis of vulva and vagina: Secondary | ICD-10-CM | POA: Diagnosis not present

## 2024-09-13 DIAGNOSIS — B372 Candidiasis of skin and nail: Secondary | ICD-10-CM

## 2024-09-13 MED ORDER — FLUCONAZOLE 150 MG PO TABS: ORAL_TABLET | ORAL | 0 refills | Status: DC

## 2024-09-13 MED ORDER — NYSTATIN 100000 UNIT/GM EX CREA: 1.0000 | TOPICAL_CREAM | Freq: Two times a day (BID) | CUTANEOUS | 3 refills | Status: AC

## 2024-09-13 NOTE — Progress Notes (Signed)
 Symptom Management Clinic  [facility] Department of the [place]. [facility] [address] [place], [place] [address] [phone] (phone) [phone] (fax)  Patient Care Team: Center, [facility] as PCP - General (General Practice) Maurie Rayfield BIRCH, RN as Oncology Nurse Navigator Lenn Aran, MD as Radiation Oncologist (Radiation Oncology) Babara Call, MD as Consulting Physician (Oncology)   Name of the patient: Sandra Fischer  [id number]  07-08-1951   Date of visit: 09/13/24  Diagnosis- Endometrial Cancer  Chief complaint/ Reason for visit- skin yeast  Heme/Onc history:  Oncology History  Recurrent carcinoma of endometrium (HCC)  07/15/2019 Initial Diagnosis   Endometrial cancer  Patient had TH/BSO sentinel lymph node biopsy by [facility] oncology Dr. Augustin on 06/21/2019. Extensive medical records from [facility] review was performed by me.  Tumor size 9.3 cm, invading 99% of myometrium [3.55 out of 3.6 cm], positive right external iliac sentinel lymph nodes.  Negative washing.  No LVI, serosa is uninvolved. 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 [id number]  FIGO stage IIIC1 MSI intact HER 2 negative.   her case was discussed at [facility] tumor board on 07/04/2019  PORTEC-3 regimen was recommended given subgroup analysis showing benefit in the serous histology  Patient declined treatment.     10/20/2019 Relapse/Recurrence   CT chest abdomen pelvis with contrast showed disease recurrence. There are multiple newly enlarged retroperitoneal and right iliac lymph nodes the largest the left retroperitoneal node measuring 1.9 x 1.6 cm concerning for nodal metastasis disease. Unchanged 4 mm groundglass pulmonary nodule of the left pulmonary apex. This remains nonspecific.    11/07/2019 - 02/20/2020 Chemotherapy   carboplatin  AUC 4.5 and paclitaxel  175 mg/m2 x 2  cycles carboplatin  AUC4.5 and paclitaxel  135 mg/m2 x 4 cycles   03/12/2020 Imaging   CT chest abdomen pelvis with contrast showed 1. The multiple enlarged retroperitoneal and right iliac lymph nodes identified as new on the previous exam from 10/20/2019 have resolved completely in the interval. 2. Tiny ground-glass pulmonary nodule medial left lung apex is stable in the interval. Likely benign, continued attention on follow-up recommended. 3. No new or progressive interval findings. 4.  Aortic Atherosclerois (ICD10-170.0   06/07/2024 Imaging   CT chest abdomen pelvis with contrast showed 1. Newly enlarged left retroperitoneal lymph nodes measuring up to 2.5 x 2.2 cm, consistent with nodal metastatic disease. 2. Occasional tiny pulmonary nodules unchanged, most likely benign and incidental. 3. Status post hysterectomy. 4. Nonobstructive left nephrolithiasis.   06/21/2024 Relapse/Recurrence   Periaortic left side soft tissue mass biopsy showed metastatic poorly differentiated carcinoma.    Diagnosis Note : The specimen demonstrates a pleomorphic high-grade epithelioid proliferation. Immunohistochemical stains were performed to characterize the tumor cells. The cells are positive for CK AE1/AE3, GATA3, CDX2, and p40. P53 is mutant type of staining. The cells are negative for PAX8, TTF-1, WT1, ER, -PR, S100 and SOX10. The immunoprofile is not specific and is not indicative of a specific site of primary. The patient's history of endometrial carcinoma is notes. The tumor most likely represents a metastasis from the patient's known endometrial carcinoma since metastatic tumor cells could aberrant loss or gain expression of lineage markers. However, metastasis from other organ system cannot be entirely excluded. Correlation with imaging findings is recommended. Controls worked appropriately.    07/08/2024 -  Chemotherapy   Patient is on Treatment Plan : UTERINE Carboplatin  + Paclitaxel  q21d  Interval history-patient is 73 year old female who presents to symptom management clinic for complaints of skin and vaginal yeast infection.  Symptoms started after taking antibiotics and have persisted since.  She was previously treated with topical nystatin  but feels it is messy and cumbersome to apply.  Was also treated with a single dose of Diflucan  but did not notice symptom improvement.  Complains of vaginal itching and burning with discharge.  Noticing discharge under her breasts  Review of systems- Review of Systems  Genitourinary:        Per hpi  Skin:  Positive for itching and rash.    Allergies  Allergen Reactions   Abatacept Hives   Codeine Hives, Nausea And Vomiting and Nausea Only   Iodine Swelling   Latex Itching, Hives and Rash   Shellfish Allergy Swelling   Trimethoprim  Hydrochloride [Trimethoprim ] Other (See Comments)    Pancreatitis   Penicillins Nausea And Vomiting   Past Medical History:  Diagnosis Date   Asthma    Asthma    Asthma exacerbation 08/22/2016   Cancer (HCC)    Collagen vascular disease (HCC)    Diabetes mellitus without complication (HCC)    History of Diabetes    Dyspnea    Environmental allergies    Fibromyalgia    High cholesterol    Hypertension    Hypothyroidism    Iron  deficiency anemia 07/03/2020   Neuropathy    RA (rheumatoid arthritis) (HCC)    Sleep apnea    Thyroid  disease    Past Surgical History:  Procedure Laterality Date   ABDOMINAL HYSTERECTOMY     CATARACT EXTRACTION     CHOLECYSTECTOMY     COLONOSCOPY WITH PROPOFOL  N/A 03/14/2022   Procedure: COLONOSCOPY WITH PROPOFOL ;  Surgeon: Maryruth Ole DASEN, MD;  Location: [facility] ENDOSCOPY;  Service: Endoscopy;  Laterality: N/A;  DM   CYSTOURETHROSCOPY     DIAGNOSTIC LAPAROSCOPY     EYE SURGERY     fibroids removed     PORTA CATH REMOVAL N/A 02/14/2021   Procedure: PORTA CATH REMOVAL;  Surgeon: Marea Selinda RAMAN, MD;  Location: [facility] INVASIVE CV LAB;  Service: Cardiovascular;   Laterality: N/A;   THYROIDECTOMY, PARTIAL     tummy tuck     Social History   Socioeconomic History   Marital status: Married    Spouse name: Cedric    Number of children: Not on file   Years of education: Not on file   Highest education level: Not on file  Occupational History   Occupation: Retired  Tobacco Use   Smoking status: Former    Current packs/day: 0.00    Average packs/day: 0.1 packs/day for 0.2 years    Types: Cigarettes    Start date: 09/1978    Quit date: 1980    Years since quitting: 45.7   Smokeless tobacco: Never  Vaping Use   Vaping status: Never Used  Substance and Sexual Activity   Alcohol use: No   Drug use: No   Sexual activity: Yes    Birth control/protection: Post-menopausal  Other Topics Concern   Not on file  Social History Narrative   Not on file   Social Drivers of Health   Financial Resource Strain: Not on file  Food Insecurity: Not on file  Transportation Needs: Not on file  Physical Activity: Not on file  Stress: Not on file  Social Connections: Not on file  Intimate Partner Violence: Not on file   Family History  Problem Relation Age of Onset  Diabetes Mother    Hypertension Mother    Hyperlipidemia Mother    Dementia Mother    Breast cancer Neg Hx    Ovarian cancer Neg Hx    Colon cancer Neg Hx    Current Outpatient Medications:    Acetaminophen  (TYLENOL  EXTRA STRENGTH PO), Take 1,300 mg by mouth 2 (two) times daily as needed., Disp: , Rfl:    ascorbic acid (VITAMIN C) 500 MG tablet, Take 500 mg by mouth daily., Disp: , Rfl:    aspirin  EC 81 MG tablet, Take 81 mg by mouth daily., Disp: , Rfl:    azelastine  (ASTELIN ) 0.1 % nasal spray, Place 2 sprays into both nostrils 2 (two) times daily., Disp: 30 mL, Rfl: 5   Calcium Carbonate-Vit D-Min (CALCIUM 600+D PLUS MINERALS) 600-400 MG-UNIT TABS, Take 1 tablet by mouth 2 (two) times daily., Disp: , Rfl:    cetirizine  (ZYRTEC ) 10 MG tablet, Take 1 tablet (10 mg total) by mouth  daily., Disp: 30 tablet, Rfl: 0   Cholecalciferol (D3 ADULT PO), Take 1 capsule by mouth daily., Disp: , Rfl:    desloratadine (CLARINEX) 5 MG tablet, Take 5 mg by mouth daily., Disp: , Rfl:    dexamethasone  (DECADRON ) 4 MG tablet, Take 2 tablets (8 mg total) by mouth daily for 3 days. Start the day after chemotherapy. Take with food., Disp: 30 tablet, Rfl: 1   doxycycline  (VIBRAMYCIN ) 100 MG capsule, Take 100 mg by mouth., Disp: , Rfl:    empagliflozin (JARDIANCE) 25 MG TABS tablet, Take 12.5 mg by mouth daily., Disp: , Rfl:    fluticasone  (FLONASE ) 50 MCG/ACT nasal spray, Place 1 spray into both nostrils 2 (two) times daily., Disp: 16 g, Rfl: 0   fluticasone -salmeterol (ADVAIR) 250-50 MCG/ACT AEPB, Inhale 1 puff into the lungs in the morning and at bedtime., Disp: , Rfl:    furosemide (LASIX) 10 MG/ML solution, Take by mouth daily., Disp: , Rfl:    gabapentin  (NEURONTIN ) 300 MG capsule, Take 1 capsule (300 mg total) by mouth 2 (two) times daily., Disp: 60 capsule, Rfl: 0   hydrALAZINE (APRESOLINE) 25 MG tablet, Take 25 mg by mouth 2 (two) times daily., Disp: , Rfl:    ipratropium (ATROVENT ) 0.02 % nebulizer solution, Take 0.5 mg by nebulization every 4 (four) hours as needed. , Disp: , Rfl:    levothyroxine (SYNTHROID) 75 MCG tablet, Take 75 mcg by mouth daily before breakfast., Disp: , Rfl:    lisinopril (PRINIVIL,ZESTRIL) 40 MG tablet, Take 20 mg by mouth in the morning and at bedtime. , Disp: , Rfl:    metoprolol succinate (TOPROL-XL) 50 MG 24 hr tablet, Take 50 mg by mouth daily. Take with or immediately following a meal., Disp: , Rfl:    montelukast  (SINGULAIR ) 10 MG tablet, Take 10 mg by mouth at bedtime., Disp: , Rfl:    nitroGLYCERIN (NITROSTAT) 0.4 MG SL tablet, Place under the tongue. Place 1 tablet (0.4 mg total) under the tongue every 5 (five) minutes as needed for Chest pain May take up to 3 doses., Disp: , Rfl:    nystatin  (MYCOSTATIN /NYSTOP ) powder, Apply small amount to affected  area 2-3 times a day until healed., Disp: 60 g, Rfl: 2   olopatadine  (PATANOL) 0.1 % ophthalmic solution, Place 1 drop into both eyes 2 (two) times daily., Disp: 5 mL, Rfl: 5   omeprazole (PRILOSEC) 10 MG capsule, Take 20 mg by mouth daily., Disp: , Rfl:    potassium chloride  (KLOR-CON  M) 10 MEQ tablet,  Take 1 tablet (10 mEq total) by mouth daily., Disp: 30 tablet, Rfl: 0   pravastatin (PRAVACHOL) 40 MG tablet, Take 40 mg by mouth daily., Disp: , Rfl:    predniSONE  (DELTASONE ) 5 MG tablet, Take 5 mg by mouth daily with breakfast., Disp: , Rfl:    Prenatal Vit-Fe Fumarate-FA (MULTIVITAMIN-PRENATAL) 27-0.8 MG TABS tablet, Take 1 tablet by mouth daily. , Disp: , Rfl:    tacrolimus (PROTOPIC) 0.1 % ointment, Apply 1 Application topically 2 (two) times daily., Disp: , Rfl:    verapamil (CALAN) 120 MG tablet, Take 240 mg by mouth 2 (two) times daily., Disp: , Rfl:    clotrimazole-betamethasone (LOTRISONE) cream, Apply topically 4 (four) times daily as needed. (Patient not taking: Reported on 09/13/2024), Disp: , Rfl:    EPINEPHrine  0.3 mg/0.3 mL IJ SOAJ injection, Use as directed for severe allergic reaction. (Patient not taking: Reported on 09/13/2024), Disp: 2 Device, Rfl: 1   etanercept (ENBREL) 25 MG injection, Inject 25 mg into the skin. (Patient not taking: Reported on 09/13/2024), Disp: , Rfl:    leflunomide (ARAVA) 20 MG tablet, Take 20 mg by mouth daily., Disp: , Rfl:    leflunomide (ARAVA) 20 MG tablet, Take 1 tablet by mouth daily., Disp: , Rfl:    nortriptyline (PAMELOR) 25 MG capsule, Take 25 mg by mouth at bedtime., Disp: , Rfl:    ondansetron  (ZOFRAN ) 8 MG tablet, Take 1 tablet (8 mg total) by mouth every 8 (eight) hours as needed for nausea or vomiting. Start on the third day after chemotherapy. (Patient not taking: Reported on 09/13/2024), Disp: 30 tablet, Rfl: 1   ondansetron  (ZOFRAN -ODT) 4 MG disintegrating tablet, Take 1 tablet (4 mg total) by mouth every 8 (eight) hours as needed for  nausea or vomiting. (Patient not taking: Reported on 09/13/2024), Disp: 15 tablet, Rfl: 0   predniSONE  (DELTASONE ) 50 MG tablet, Take 1 tablet (50 mg total) by mouth See admin instructions. Prednisone  - take 50 mg by mouth at 13 hours, 7 hours, and 1 hour before contrast media injection (Patient not taking: Reported on 09/13/2024), Disp: 3 tablet, Rfl: 0   prochlorperazine  (COMPAZINE ) 10 MG tablet, Take 1 tablet (10 mg total) by mouth every 6 (six) hours as needed for nausea or vomiting. (Patient not taking: Reported on 09/13/2024), Disp: 30 tablet, Rfl: 1   senna-docusate (SENOKOT-S) 8.6-50 MG tablet, Take 1 tablet by mouth at bedtime as needed for mild constipation. (Patient not taking: Reported on 09/13/2024), Disp: , Rfl:    sulfaSALAzine (AZULFIDINE) 500 MG tablet, TAKE ONE TABLET BY MOUTH TWO TIMES A DAY FOR 14 DAYS, THEN TAKE TWO TABLETS TWO TIMES A DAY FOR 14 DAYS, THEN TAKE THREE TABLETS TWO TIMES A DAY FOR 62 DAYS FOR INFLAMMATION. TAKE AFTER MEALS, Disp: , Rfl:    tiotropium (SPIRIVA) 18 MCG inhalation capsule, Place 18 mcg into inhaler and inhale daily. (Patient not taking: Reported on 09/13/2024), Disp: , Rfl:   Physical exam:  Vitals:   09/13/24 1500  BP: (!) 116/59  Pulse: 65  Resp: 18  Temp: 97.8 F (36.6 C)  TempSrc: Tympanic  SpO2: 96%   Physical Exam Chest:     Comments: Moist reddened rash Under breast bilaterally Neurological:     Mental Status: She is oriented to person, place, and time.  Psychiatric:        Mood and Affect: Mood normal.        Behavior: Behavior normal.         Latest  Ref Rng & Units 09/09/2024    8:31 AM  CMP  Glucose 70 - 99 mg/dL 830   BUN 8 - 23 mg/dL 16   Creatinine 9.55 - 1.00 mg/dL 9.13   Sodium 864 - 854 mmol/L 140   Potassium 3.5 - 5.1 mmol/L 3.6   Chloride 98 - 111 mmol/L 108   CO2 22 - 32 mmol/L 24   Calcium 8.9 - 10.3 mg/dL 9.1   Total Protein 6.5 - 8.1 g/dL 7.3   Total Bilirubin 0.0 - 1.2 mg/dL 0.6   Alkaline Phos 38 - 126 U/L  88   AST 15 - 41 U/L 20   ALT 0 - 44 U/L 15    No results found.  Assessment and plan- Patient is a 73 y.o. female who presents to symptom management clinic for complaints of rash and itching vaginal discharge  1.  Vulvovaginal candidiasis and candidiasis of the skin-likely secondary to immunocompromise state and antibiotic use.  Poor response to topical nystatin  powder.  Recommend rotating to nystatin  ointment.  Start Diflucan  150 mg every 3 days for 3 doses then 1 tablet weekly for 7 weeks.  Keep skin clean and dry.  Notify clinic for reevaluation if symptoms do not improve or worsen in the interim.   Visit Diagnosis 1. Candidiasis of skin   2. Vulvovaginal candidiasis    Patient expressed understanding and was in agreement with this plan. She also understands that She can call clinic at any time with any questions, concerns, or complaints.   Thank you for allowing me to participate in the care of this very pleasant patient.   Tinnie Dawn, DNP, AGNP-C, AOCNP [facility] [phone]  CC: Dr Babara

## 2024-09-22 ENCOUNTER — Telehealth: Payer: Self-pay | Admitting: Oncology

## 2024-09-22 ENCOUNTER — Other Ambulatory Visit: Payer: Self-pay | Admitting: Oncology

## 2024-09-22 DIAGNOSIS — C541 Malignant neoplasm of endometrium: Secondary | ICD-10-CM

## 2024-09-22 NOTE — Telephone Encounter (Signed)
 Received secure chat from radiation that pt had called to cancel tx this Friday 9/26 due to having covid.  Dr.Yu updated tx dates.  I called and spoke with pt to provider the updated date/times.  Mailing AVS per pt request.

## 2024-09-23 ENCOUNTER — Inpatient Hospital Stay

## 2024-09-23 ENCOUNTER — Other Ambulatory Visit: Payer: Self-pay | Admitting: Nurse Practitioner

## 2024-09-23 ENCOUNTER — Inpatient Hospital Stay: Admitting: Oncology

## 2024-09-25 ENCOUNTER — Encounter: Payer: Self-pay | Admitting: Oncology

## 2024-09-26 ENCOUNTER — Other Ambulatory Visit: Payer: Self-pay

## 2024-09-27 ENCOUNTER — Encounter: Payer: Self-pay | Admitting: Oncology

## 2024-09-28 ENCOUNTER — Telehealth: Payer: Self-pay | Admitting: *Deleted

## 2024-09-28 ENCOUNTER — Inpatient Hospital Stay

## 2024-09-28 NOTE — Telephone Encounter (Signed)
 Patient left vm that she was returning the teams call.

## 2024-09-28 NOTE — Telephone Encounter (Signed)
 Per Zaria-scheduling was able to speak to patient re: her apts today.

## 2024-09-28 NOTE — Telephone Encounter (Addendum)
 I have not contacted patient.  It looks like she cancelled today's GYN appt.

## 2024-09-29 ENCOUNTER — Other Ambulatory Visit

## 2024-09-30 ENCOUNTER — Other Ambulatory Visit: Payer: Self-pay

## 2024-10-06 ENCOUNTER — Inpatient Hospital Stay

## 2024-10-06 ENCOUNTER — Encounter: Payer: Self-pay | Admitting: Oncology

## 2024-10-06 ENCOUNTER — Ambulatory Visit: Admitting: Oncology

## 2024-10-06 ENCOUNTER — Inpatient Hospital Stay: Attending: Oncology

## 2024-10-06 ENCOUNTER — Inpatient Hospital Stay (HOSPITAL_BASED_OUTPATIENT_CLINIC_OR_DEPARTMENT_OTHER): Admitting: Oncology

## 2024-10-06 VITALS — BP 132/58 | HR 67 | Temp 98.0°F | Resp 18 | Wt 206.8 lb

## 2024-10-06 VITALS — BP 128/64 | HR 66

## 2024-10-06 DIAGNOSIS — Z9079 Acquired absence of other genital organ(s): Secondary | ICD-10-CM | POA: Insufficient documentation

## 2024-10-06 DIAGNOSIS — G620 Drug-induced polyneuropathy: Secondary | ICD-10-CM | POA: Diagnosis not present

## 2024-10-06 DIAGNOSIS — Z90722 Acquired absence of ovaries, bilateral: Secondary | ICD-10-CM | POA: Insufficient documentation

## 2024-10-06 DIAGNOSIS — E876 Hypokalemia: Secondary | ICD-10-CM | POA: Insufficient documentation

## 2024-10-06 DIAGNOSIS — C541 Malignant neoplasm of endometrium: Secondary | ICD-10-CM

## 2024-10-06 DIAGNOSIS — M069 Rheumatoid arthritis, unspecified: Secondary | ICD-10-CM | POA: Diagnosis not present

## 2024-10-06 DIAGNOSIS — C772 Secondary and unspecified malignant neoplasm of intra-abdominal lymph nodes: Secondary | ICD-10-CM | POA: Insufficient documentation

## 2024-10-06 DIAGNOSIS — R911 Solitary pulmonary nodule: Secondary | ICD-10-CM | POA: Diagnosis not present

## 2024-10-06 DIAGNOSIS — C778 Secondary and unspecified malignant neoplasm of lymph nodes of multiple regions: Secondary | ICD-10-CM | POA: Diagnosis not present

## 2024-10-06 DIAGNOSIS — B3731 Acute candidiasis of vulva and vagina: Secondary | ICD-10-CM | POA: Insufficient documentation

## 2024-10-06 DIAGNOSIS — M810 Age-related osteoporosis without current pathological fracture: Secondary | ICD-10-CM | POA: Insufficient documentation

## 2024-10-06 DIAGNOSIS — Z90710 Acquired absence of both cervix and uterus: Secondary | ICD-10-CM | POA: Diagnosis not present

## 2024-10-06 DIAGNOSIS — C775 Secondary and unspecified malignant neoplasm of intrapelvic lymph nodes: Secondary | ICD-10-CM | POA: Insufficient documentation

## 2024-10-06 DIAGNOSIS — C7989 Secondary malignant neoplasm of other specified sites: Secondary | ICD-10-CM | POA: Insufficient documentation

## 2024-10-06 DIAGNOSIS — Z5189 Encounter for other specified aftercare: Secondary | ICD-10-CM | POA: Diagnosis not present

## 2024-10-06 DIAGNOSIS — Z5111 Encounter for antineoplastic chemotherapy: Secondary | ICD-10-CM | POA: Insufficient documentation

## 2024-10-06 DIAGNOSIS — Z8542 Personal history of malignant neoplasm of other parts of uterus: Secondary | ICD-10-CM | POA: Diagnosis present

## 2024-10-06 LAB — CBC WITH DIFFERENTIAL (CANCER CENTER ONLY)
Abs Immature Granulocytes: 0.07 K/uL (ref 0.00–0.07)
Basophils Absolute: 0.1 K/uL (ref 0.0–0.1)
Basophils Relative: 1 %
Eosinophils Absolute: 0.2 K/uL (ref 0.0–0.5)
Eosinophils Relative: 2 %
HCT: 39.5 % (ref 36.0–46.0)
Hemoglobin: 12.2 g/dL (ref 12.0–15.0)
Immature Granulocytes: 1 %
Lymphocytes Relative: 34 %
Lymphs Abs: 3.1 K/uL (ref 0.7–4.0)
MCH: 27.7 pg (ref 26.0–34.0)
MCHC: 30.9 g/dL (ref 30.0–36.0)
MCV: 89.6 fL (ref 80.0–100.0)
Monocytes Absolute: 0.6 K/uL (ref 0.1–1.0)
Monocytes Relative: 7 %
Neutro Abs: 5 K/uL (ref 1.7–7.7)
Neutrophils Relative %: 55 %
Platelet Count: 255 K/uL (ref 150–400)
RBC: 4.41 MIL/uL (ref 3.87–5.11)
RDW: 17.2 % — ABNORMAL HIGH (ref 11.5–15.5)
WBC Count: 8.9 K/uL (ref 4.0–10.5)
nRBC: 0 % (ref 0.0–0.2)

## 2024-10-06 LAB — IRON AND TIBC
Iron: 59 ug/dL (ref 28–170)
Saturation Ratios: 21 % (ref 10.4–31.8)
TIBC: 279 ug/dL (ref 250–450)
UIBC: 220 ug/dL

## 2024-10-06 LAB — CMP (CANCER CENTER ONLY)
ALT: 18 U/L (ref 0–44)
AST: 20 U/L (ref 15–41)
Albumin: 3.8 g/dL (ref 3.5–5.0)
Alkaline Phosphatase: 67 U/L (ref 38–126)
Anion gap: 11 (ref 5–15)
BUN: 18 mg/dL (ref 8–23)
CO2: 26 mmol/L (ref 22–32)
Calcium: 9.2 mg/dL (ref 8.9–10.3)
Chloride: 103 mmol/L (ref 98–111)
Creatinine: 0.83 mg/dL (ref 0.44–1.00)
GFR, Estimated: >60 mL/min (ref >60)
Glucose, Bld: 164 mg/dL — ABNORMAL HIGH (ref 70–99)
Potassium: 3.4 mmol/L — ABNORMAL LOW (ref 3.5–5.1)
Sodium: 140 mmol/L (ref 135–145)
Total Bilirubin: 0.5 mg/dL (ref 0.0–1.2)
Total Protein: 7.3 g/dL (ref 6.5–8.1)

## 2024-10-06 LAB — RETIC PANEL
Immature Retic Fract: 24.8 % — ABNORMAL HIGH (ref 2.3–15.9)
RBC.: 4.45 MIL/uL (ref 3.87–5.11)
Retic Count, Absolute: 77.9 K/uL (ref 19.0–186.0)
Retic Ct Pct: 1.8 % (ref 0.4–3.1)
Reticulocyte Hemoglobin: 27.6 pg — ABNORMAL LOW (ref >27.9)

## 2024-10-06 LAB — VITAMIN B12: Vitamin B-12: 859 pg/mL (ref 180–914)

## 2024-10-06 LAB — FERRITIN: Ferritin: 235 ng/mL (ref 11–307)

## 2024-10-06 LAB — FOLATE: Folate: >20 ng/mL (ref >5.9)

## 2024-10-06 MED ORDER — PEGFILGRASTIM 6 MG/0.6ML ~~LOC~~ PSKT
6.0000 mg | PREFILLED_SYRINGE | Freq: Once | SUBCUTANEOUS | Status: AC
  Administered 2024-10-06: 6 mg via SUBCUTANEOUS
  Filled 2024-10-06: qty 0.6

## 2024-10-06 MED ORDER — SODIUM CHLORIDE 0.9 % IV SOLN
INTRAVENOUS | Status: DC
  Filled 2024-10-06 (×2): qty 250

## 2024-10-06 MED ORDER — FAMOTIDINE IN NACL 20-0.9 MG/50ML-% IV SOLN
20.0000 mg | Freq: Once | INTRAVENOUS | Status: AC
  Administered 2024-10-06: 20 mg via INTRAVENOUS
  Filled 2024-10-06: qty 50

## 2024-10-06 MED ORDER — PALONOSETRON HCL INJECTION 0.25 MG/5ML
0.2500 mg | Freq: Once | INTRAVENOUS | Status: AC
  Administered 2024-10-06: 0.25 mg via INTRAVENOUS
  Filled 2024-10-06: qty 5

## 2024-10-06 MED ORDER — SODIUM CHLORIDE 0.9 % IV SOLN
456.3000 mg | Freq: Once | INTRAVENOUS | Status: AC
  Administered 2024-10-06: 460 mg via INTRAVENOUS
  Filled 2024-10-06: qty 46

## 2024-10-06 MED ORDER — DIPHENHYDRAMINE HCL 50 MG/ML IJ SOLN
50.0000 mg | Freq: Once | INTRAMUSCULAR | Status: AC
  Administered 2024-10-06: 50 mg via INTRAVENOUS
  Filled 2024-10-06: qty 1

## 2024-10-06 MED ORDER — PROCHLORPERAZINE EDISYLATE 10 MG/2ML IJ SOLN
10.0000 mg | Freq: Once | INTRAMUSCULAR | Status: AC
  Administered 2024-10-06: 10 mg via INTRAVENOUS
  Filled 2024-10-06: qty 2

## 2024-10-06 MED ORDER — SODIUM CHLORIDE 0.9 % IV SOLN
135.0000 mg/m2 | Freq: Once | INTRAVENOUS | Status: AC
  Administered 2024-10-06: 264 mg via INTRAVENOUS
  Filled 2024-10-06: qty 44

## 2024-10-06 MED ORDER — DEXAMETHASONE SODIUM PHOSPHATE 10 MG/ML IJ SOLN
10.0000 mg | Freq: Once | INTRAMUSCULAR | Status: AC
  Administered 2024-10-06: 10 mg via INTRAVENOUS
  Filled 2024-10-06: qty 1

## 2024-10-06 NOTE — Assessment & Plan Note (Addendum)
 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 Patient with developed recurrent disease, retroperitoneal mass-> biopsy proven recurrence. pMMR status, HER2 1+  Labs are reviewed and discussed with patient.  Proceed with cycle 3 carboplatin  AUC 4.5 Taxol  135mg /m2,with GCSF Repeat CT scan.

## 2024-10-06 NOTE — Addendum Note (Signed)
 Addended by: BABARA CALL on: 10/06/2024 02:24 PM   Modules accepted: Orders

## 2024-10-06 NOTE — Assessment & Plan Note (Signed)
 Recommend patient to finish course of fluconazole .

## 2024-10-06 NOTE — Assessment & Plan Note (Signed)
#  Neuropathy due to chemotherapy.   Overall improved. She has self discontinued gabapentin , manageable symptoms.

## 2024-10-06 NOTE — Assessment & Plan Note (Signed)
 Off RA med while on chemotherapy.   follow up with rheumatology

## 2024-10-06 NOTE — Assessment & Plan Note (Signed)
 Recommend potassium 10meq dialy

## 2024-10-06 NOTE — Patient Instructions (Signed)

## 2024-10-06 NOTE — Progress Notes (Signed)
 Hematology/Oncology Progress note [phone] [phone]    CHIEF COMPLAINTS/REASON FOR VISIT:  Follow up for serous endometrial cancer  ASSESSMENT & PLAN:   Recurrent carcinoma of endometrium (HCC) Recurrent endometrial cancer, TP 53 + Status post 6 cycles of chemotherapy with carboplatin  and Taxol . Patient with developed recurrent disease, retroperitoneal mass-> biopsy proven recurrence. pMMR status, HER2 1+  Labs are reviewed and discussed with patient.  Proceed with cycle 3 carboplatin  AUC 4.5 Taxol  135mg /m2,with GCSF Repeat CT scan.   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   Overall improved. She has self discontinued gabapentin , manageable symptoms.   Rheumatoid arthritis (HCC) Off RA med while on chemotherapy.   follow up with rheumatology   Hypokalemia Recommend potassium 10meq dialy  Vaginal candidiasis Recommend patient to finish course of fluconazole .    Patient declined influenza vaccination today, plan to get it at [place].   Orders Placed This Encounter  Procedures   CT CHEST ABDOMEN PELVIS W CONTRAST    Standing Status:   Future    Expected Date:   10/20/2024    Expiration Date:   10/06/2025    If indicated for the ordered procedure, I authorize the administration of contrast media per Radiology protocol:   Yes    Does the patient have a contrast media/X-ray dye allergy?:   No    Preferred imaging location?:   [facility]    If indicated for the ordered procedure, I authorize the administration of oral contrast media per Radiology protocol:   Yes   Follow-up 3 weeks All questions were answered. The patient knows to call the clinic with any problems, questions or concerns.  [name], MD, PhD [facility] Hematology Oncology 10/06/2024   HISTORY OF PRESENTING ILLNESS:   Sandra Fischer is a  73 y.o.  female presents for high-grade serous endometrial cancer Oncology History  Recurrent carcinoma  of endometrium (HCC)  07/15/2019 Initial Diagnosis   Endometrial cancer  Patient had TH/BSO sentinel lymph node biopsy by [facility] GYN oncology Dr. Augustin on 06/21/2019. Extensive medical records from [facility] health system review was performed by me.  Tumor size 9.3 cm, invading 99% of myometrium [3.55 out of 3.6 cm], positive right external iliac sentinel lymph nodes.  Negative washing.  No LVI, serosa is uninvolved. 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 [id number]  FIGO stage IIIC1 MSI intact HER 2 negative.   her case was discussed at [facility] tumor board on 07/04/2019  PORTEC-3 regimen was recommended given subgroup analysis showing benefit in the serous histology  Patient declined treatment.     10/20/2019 Relapse/Recurrence   CT chest abdomen pelvis with contrast showed disease recurrence. There are multiple newly enlarged retroperitoneal and right iliac lymph nodes the largest the left retroperitoneal node measuring 1.9 x 1.6 cm concerning for nodal metastasis disease. Unchanged 4 mm groundglass pulmonary nodule of the left pulmonary apex. This remains nonspecific.    11/07/2019 - 02/20/2020 Chemotherapy   carboplatin  AUC 4.5 and paclitaxel  175 mg/m2 x 2 cycles carboplatin  AUC4.5 and paclitaxel  135 mg/m2 x 4 cycles   03/12/2020 Imaging   CT chest abdomen pelvis with contrast showed 1. The multiple enlarged retroperitoneal and right iliac lymph nodes identified as new on the previous exam from 10/20/2019 have resolved completely in the interval. 2. Tiny ground-glass pulmonary nodule medial left lung apex is stable in the interval. Likely benign, continued attention on follow-up recommended. 3. No new or progressive interval findings. 4.  Aortic Atherosclerois (  ICD10-170.0   06/07/2024 Imaging   CT chest abdomen pelvis with contrast showed 1. Newly enlarged left retroperitoneal lymph nodes measuring up to 2.5 x 2.2 cm, consistent with nodal  metastatic disease. 2. Occasional tiny pulmonary nodules unchanged, most likely benign and incidental. 3. Status post hysterectomy. 4. Nonobstructive left nephrolithiasis.   06/21/2024 Relapse/Recurrence   Periaortic left side soft tissue mass biopsy showed metastatic poorly differentiated carcinoma.    Diagnosis Note : The specimen demonstrates a pleomorphic high-grade epithelioid proliferation. Immunohistochemical stains were performed to characterize the tumor cells. The cells are positive for CK AE1/AE3, GATA3, CDX2, and p40. P53 is mutant type of staining. The cells are negative for PAX8, TTF-1, WT1, ER, -PR, S100 and SOX10. The immunoprofile is not specific and is not indicative of a specific site of primary. The patient's history of endometrial carcinoma is notes. The tumor most likely represents a metastasis from the patient's known endometrial carcinoma since metastatic tumor cells could aberrant loss or gain expression of lineage markers. However, metastasis from other organ system cannot be entirely excluded. Correlation with imaging findings is recommended. Controls worked appropriately.    07/08/2024 -  Chemotherapy   Patient is on Treatment Plan : UTERINE Carboplatin  + Paclitaxel  q21d       INTERVAL HISTORY Sandra Fischer is a 73 y.o. female who has above history reviewed by me today presents for follow up visit for management of endometrial cancer.  She finished course of doxycycline  for bronchitis.  She is on a course of fluconazole  for vaginal candidiasis, and also skin candidiasis.  She was previously on Enbrel and Arava and is currently off RA medication. Per patient she called her rheumatologist who is ok with her off treatment.  Today she has no new complaints.    Review of Systems  Constitutional:  Positive for fatigue. Negative for appetite change, chills and fever.  HENT:   Negative for hearing loss and voice change.   Eyes:  Negative for eye problems.  Respiratory:   Negative for chest tightness and cough.   Cardiovascular:  Negative for chest pain.  Gastrointestinal:  Positive for diarrhea. Negative for abdominal distention, abdominal pain and blood in stool.  Endocrine: Negative for hot flashes.  Genitourinary:  Positive for vaginal discharge. Negative for difficulty urinating and frequency.   Musculoskeletal:  Negative for arthralgias.  Skin:  Negative for itching and rash.  Neurological:  Positive for numbness. Negative for extremity weakness.  Hematological:  Negative for adenopathy.  Psychiatric/Behavioral:  Negative for confusion.     MEDICAL HISTORY:  Past Medical History:  Diagnosis Date   Asthma    Asthma    Asthma exacerbation 08/22/2016   Cancer (HCC)    Collagen vascular disease    Diabetes mellitus without complication (HCC)    History of Diabetes    Dyspnea    Environmental allergies    Fibromyalgia    High cholesterol    Hypertension    Hypothyroidism    Iron  deficiency anemia 07/03/2020   Neuropathy    RA (rheumatoid arthritis) (HCC)    Sleep apnea    Thyroid  disease     SURGICAL HISTORY: Past Surgical History:  Procedure Laterality Date   ABDOMINAL HYSTERECTOMY     CATARACT EXTRACTION     CHOLECYSTECTOMY     COLONOSCOPY WITH PROPOFOL  N/A 03/14/2022   Procedure: COLONOSCOPY WITH PROPOFOL ;  Surgeon: Maryruth Ole DASEN, MD;  Location: [facility] ENDOSCOPY;  Service: Endoscopy;  Laterality: N/A;  DM   CYSTOURETHROSCOPY  DIAGNOSTIC LAPAROSCOPY     EYE SURGERY     fibroids removed     PORTA CATH REMOVAL N/A 02/14/2021   Procedure: PORTA CATH REMOVAL;  Surgeon: Marea Selinda RAMAN, MD;  Location: [facility] INVASIVE CV LAB;  Service: Cardiovascular;  Laterality: N/A;   THYROIDECTOMY, PARTIAL     tummy tuck      SOCIAL HISTORY: Social History   Socioeconomic History   Marital status: Married    Spouse name: Cedric    Number of children: Not on file   Years of education: Not on file   Highest education level: Not on file   Occupational History   Occupation: Retired  Tobacco Use   Smoking status: Former    Current packs/day: 0.00    Average packs/day: 0.1 packs/day for 0.2 years    Types: Cigarettes    Start date: 09/1978    Quit date: 1980    Years since quitting: 45.8   Smokeless tobacco: Never  Vaping Use   Vaping status: Never Used  Substance and Sexual Activity   Alcohol use: No   Drug use: No   Sexual activity: Yes    Birth control/protection: Post-menopausal  Other Topics Concern   Not on file  Social History Narrative   Not on file   Social Drivers of Health   Financial Resource Strain: Not on file  Food Insecurity: Not on file  Transportation Needs: Not on file  Physical Activity: Not on file  Stress: Not on file  Social Connections: Not on file  Intimate Partner Violence: Not on file    FAMILY HISTORY: Family History  Problem Relation Age of Onset   Diabetes Mother    Hypertension Mother    Hyperlipidemia Mother    Dementia Mother    Breast cancer Neg Hx    Ovarian cancer Neg Hx    Colon cancer Neg Hx     ALLERGIES:  is allergic to abatacept, codeine, iodine, latex, shellfish allergy, trimethoprim  hydrochloride [trimethoprim ], and penicillins.  MEDICATIONS:  Current Outpatient Medications  Medication Sig Dispense Refill   Acetaminophen  (TYLENOL  EXTRA STRENGTH PO) Take 1,300 mg by mouth 2 (two) times daily as needed.     ascorbic acid (VITAMIN C) 500 MG tablet Take 500 mg by mouth daily.     aspirin  EC 81 MG tablet Take 81 mg by mouth daily.     azelastine  (ASTELIN ) 0.1 % nasal spray Place 2 sprays into both nostrils 2 (two) times daily. 30 mL 5   Calcium Carbonate-Vit D-Min (CALCIUM 600+D PLUS MINERALS) 600-400 MG-UNIT TABS Take 1 tablet by mouth 2 (two) times daily.     cetirizine  (ZYRTEC ) 10 MG tablet Take 1 tablet (10 mg total) by mouth daily. 30 tablet 0   Cholecalciferol (D3 ADULT PO) Take 1 capsule by mouth daily.     desloratadine (CLARINEX) 5 MG tablet Take 5  mg by mouth daily.     dexamethasone  (DECADRON ) 4 MG tablet Take 2 tablets (8 mg total) by mouth daily for 3 days. Start the day after chemotherapy. Take with food. 30 tablet 1   empagliflozin (JARDIANCE) 25 MG TABS tablet Take 12.5 mg by mouth daily.     fluconazole  (DIFLUCAN ) 150 MG tablet Take 150 mg (1 tablet) by mouth every 3 days for 3 doses then 150 mg (1 tablet) once weekly for 7 weeks 10 tablet 0   fluticasone  (FLONASE ) 50 MCG/ACT nasal spray Place 1 spray into both nostrils 2 (two) times daily. 16 g 0  fluticasone -salmeterol (ADVAIR) 250-50 MCG/ACT AEPB Inhale 1 puff into the lungs in the morning and at bedtime.     furosemide (LASIX) 10 MG/ML solution Take by mouth daily.     gabapentin  (NEURONTIN ) 300 MG capsule Take 1 capsule (300 mg total) by mouth 2 (two) times daily. 60 capsule 0   hydrALAZINE (APRESOLINE) 25 MG tablet Take 25 mg by mouth 2 (two) times daily.     ipratropium (ATROVENT ) 0.02 % nebulizer solution Take 0.5 mg by nebulization every 4 (four) hours as needed.      leflunomide (ARAVA) 20 MG tablet Take 20 mg by mouth daily.     leflunomide (ARAVA) 20 MG tablet Take 1 tablet by mouth daily.     levothyroxine (SYNTHROID) 75 MCG tablet Take 75 mcg by mouth daily before breakfast.     lisinopril (PRINIVIL,ZESTRIL) 40 MG tablet Take 20 mg by mouth in the morning and at bedtime.      metoprolol succinate (TOPROL-XL) 50 MG 24 hr tablet Take 50 mg by mouth daily. Take with or immediately following a meal.     montelukast  (SINGULAIR ) 10 MG tablet Take 10 mg by mouth at bedtime.     nitroGLYCERIN (NITROSTAT) 0.4 MG SL tablet Place under the tongue. Place 1 tablet (0.4 mg total) under the tongue every 5 (five) minutes as needed for Chest pain May take up to 3 doses.     nortriptyline (PAMELOR) 25 MG capsule Take 25 mg by mouth at bedtime.     nystatin  cream (MYCOSTATIN ) Apply 1 Application topically 2 (two) times daily. 30 g 3   olopatadine  (PATANOL) 0.1 % ophthalmic solution Place  1 drop into both eyes 2 (two) times daily. 5 mL 5   omeprazole (PRILOSEC) 10 MG capsule Take 20 mg by mouth daily.     potassium chloride  (KLOR-CON  M) 10 MEQ tablet Take 1 tablet (10 mEq total) by mouth daily. 30 tablet 0   pravastatin (PRAVACHOL) 40 MG tablet Take 40 mg by mouth daily.     predniSONE  (DELTASONE ) 5 MG tablet Take 5 mg by mouth daily with breakfast.     Prenatal Vit-Fe Fumarate-FA (MULTIVITAMIN-PRENATAL) 27-0.8 MG TABS tablet Take 1 tablet by mouth daily.      sulfaSALAzine (AZULFIDINE) 500 MG tablet TAKE ONE TABLET BY MOUTH TWO TIMES A DAY FOR 14 DAYS, THEN TAKE TWO TABLETS TWO TIMES A DAY FOR 14 DAYS, THEN TAKE THREE TABLETS TWO TIMES A DAY FOR 62 DAYS FOR INFLAMMATION. TAKE AFTER MEALS     tacrolimus (PROTOPIC) 0.1 % ointment Apply 1 Application topically 2 (two) times daily.     verapamil (CALAN) 120 MG tablet Take 240 mg by mouth 2 (two) times daily.     clotrimazole-betamethasone (LOTRISONE) cream Apply topically 4 (four) times daily as needed. (Patient not taking: Reported on 10/06/2024)     EPINEPHrine  0.3 mg/0.3 mL IJ SOAJ injection Use as directed for severe allergic reaction. (Patient not taking: Reported on 10/06/2024) 2 Device 1   etanercept (ENBREL) 25 MG injection Inject 25 mg into the skin. (Patient not taking: Reported on 10/06/2024)     ondansetron  (ZOFRAN ) 8 MG tablet Take 1 tablet (8 mg total) by mouth every 8 (eight) hours as needed for nausea or vomiting. Start on the third day after chemotherapy. (Patient not taking: Reported on 10/06/2024) 30 tablet 1   ondansetron  (ZOFRAN -ODT) 4 MG disintegrating tablet Take 1 tablet (4 mg total) by mouth every 8 (eight) hours as needed for nausea or vomiting. (Patient  not taking: Reported on 10/06/2024) 15 tablet 0   predniSONE  (DELTASONE ) 50 MG tablet Take 1 tablet (50 mg total) by mouth See admin instructions. Prednisone  - take 50 mg by mouth at 13 hours, 7 hours, and 1 hour before contrast media injection (Patient not taking:  Reported on 10/06/2024) 3 tablet 0   prochlorperazine  (COMPAZINE ) 10 MG tablet Take 1 tablet (10 mg total) by mouth every 6 (six) hours as needed for nausea or vomiting. (Patient not taking: Reported on 10/06/2024) 30 tablet 1   senna-docusate (SENOKOT-S) 8.6-50 MG tablet Take 1 tablet by mouth at bedtime as needed for mild constipation. (Patient not taking: Reported on 10/06/2024)     tiotropium (SPIRIVA) 18 MCG inhalation capsule Place 18 mcg into inhaler and inhale daily. (Patient not taking: Reported on 10/06/2024)     No current facility-administered medications for this visit.   Facility-Administered Medications Ordered in Other Visits  Medication Dose Route Frequency Provider Last Rate Last Admin   0.9 %  sodium chloride  infusion   Intravenous Continuous Babara Call, MD 5 mL/hr at 10/06/24 1038 Infusion Verify at 10/06/24 1038   CARBOplatin  (PARAPLATIN ) 460 mg in sodium chloride  0.9 % 250 mL chemo infusion  460 mg Intravenous Once Stanley Lyness, MD       PACLitaxel  (TAXOL ) 264 mg in sodium chloride  0.9 % 250 mL chemo infusion (> 80mg /m2)  135 mg/m2 (Order-Specific) Intravenous Once Babara Call, MD 98 mL/hr at 10/06/24 1055 264 mg at 10/06/24 1055   pegfilgrastim  (NEULASTA  ONPRO KIT) injection 6 mg  6 mg Subcutaneous Once Aamir Mclinden, MD         PHYSICAL EXAMINATION: ECOG PERFORMANCE STATUS: 1 - Symptomatic but completely ambulatory  Physical Exam Constitutional:      General: She is not in acute distress.    Appearance: She is obese.  HENT:     Head: Normocephalic and atraumatic.  Eyes:     General: No scleral icterus. Cardiovascular:     Rate and Rhythm: Normal rate and regular rhythm.     Heart sounds: Normal heart sounds.  Pulmonary:     Effort: Pulmonary effort is normal. No respiratory distress.     Breath sounds: Normal breath sounds. No wheezing.  Abdominal:     General: Bowel sounds are normal. There is no distension.     Palpations: Abdomen is soft.  Musculoskeletal:         General: Deformity present. Normal range of motion.     Cervical back: Normal range of motion and neck supple.  Skin:    General: Skin is warm and dry.     Findings: No erythema or rash.  Neurological:     Mental Status: She is alert and oriented to person, place, and time. Mental status is at baseline.  Psychiatric:     Comments: Feel frustrated.      LABORATORY DATA:  I have reviewed the data as listed    Latest Ref Rng & Units 10/06/2024    8:27 AM 09/09/2024    8:31 AM 08/19/2024    8:38 AM  CBC  WBC 4.0 - 10.5 K/uL 8.9  9.5  9.1   Hemoglobin 12.0 - 15.0 g/dL 87.7  88.0  87.8   Hematocrit 36.0 - 46.0 % 39.5  39.2  39.6   Platelets 150 - 400 K/uL 255  278  263       Latest Ref Rng & Units 10/06/2024    8:27 AM 09/09/2024    8:31 AM 08/19/2024  8:38 AM  CMP  Glucose 70 - 99 mg/dL 835  830  851   BUN 8 - 23 mg/dL 18  16  17    Creatinine 0.44 - 1.00 mg/dL 9.16  9.13  9.28   Sodium 135 - 145 mmol/L 140  140  138   Potassium 3.5 - 5.1 mmol/L 3.4  3.6  3.4   Chloride 98 - 111 mmol/L 103  108  106   CO2 22 - 32 mmol/L 26  24  22    Calcium 8.9 - 10.3 mg/dL 9.2  9.1  9.0   Total Protein 6.5 - 8.1 g/dL 7.3  7.3  7.7   Total Bilirubin 0.0 - 1.2 mg/dL 0.5  0.6  0.8   Alkaline Phos 38 - 126 U/L 67  88  74   AST 15 - 41 U/L 20  20  17    ALT 0 - 44 U/L 18  15  11       Iron /TIBC/Ferritin/ %Sat    Component Value Date/Time   IRON  59 10/06/2024 0827   TIBC 279 10/06/2024 0827   FERRITIN 235 10/06/2024 0827   IRONPCTSAT 21 10/06/2024 0827      RADIOGRAPHIC STUDIES: I have personally reviewed the radiological images as listed and agreed with the findings in the report., No results found.

## 2024-10-07 ENCOUNTER — Other Ambulatory Visit: Payer: Self-pay | Admitting: Pharmacist

## 2024-10-07 ENCOUNTER — Other Ambulatory Visit: Payer: Self-pay

## 2024-10-07 ENCOUNTER — Encounter: Payer: Self-pay | Admitting: Oncology

## 2024-10-07 LAB — CA 125: Cancer Antigen (CA) 125: 12.1 U/mL (ref 0.0–38.1)

## 2024-10-07 MED ORDER — PREDNISONE 50 MG PO TABS: 50.0000 mg | ORAL_TABLET | ORAL | 0 refills | Status: DC

## 2024-10-09 ENCOUNTER — Encounter: Payer: Self-pay | Admitting: Intensive Care

## 2024-10-09 ENCOUNTER — Emergency Department

## 2024-10-09 ENCOUNTER — Emergency Department
Admission: EM | Admit: 2024-10-09 | Discharge: 2024-10-09 | Disposition: A | Discharge status: Home / Self Care | Attending: Emergency Medicine | Admitting: Emergency Medicine

## 2024-10-09 ENCOUNTER — Other Ambulatory Visit: Payer: Self-pay

## 2024-10-09 DIAGNOSIS — J029 Acute pharyngitis, unspecified: Secondary | ICD-10-CM | POA: Diagnosis present

## 2024-10-09 DIAGNOSIS — D72829 Elevated white blood cell count, unspecified: Secondary | ICD-10-CM | POA: Insufficient documentation

## 2024-10-09 DIAGNOSIS — Z8542 Personal history of malignant neoplasm of other parts of uterus: Secondary | ICD-10-CM | POA: Insufficient documentation

## 2024-10-09 LAB — COMPREHENSIVE METABOLIC PANEL WITH GFR
ALT: 20 U/L (ref 0–44)
AST: 24 U/L (ref 15–41)
Albumin: 3.7 g/dL (ref 3.5–5.0)
Alkaline Phosphatase: 78 U/L (ref 38–126)
Anion gap: 12 (ref 5–15)
BUN: 25 mg/dL — ABNORMAL HIGH (ref 8–23)
CO2: 26 mmol/L (ref 22–32)
Calcium: 9.2 mg/dL (ref 8.9–10.3)
Chloride: 99 mmol/L (ref 98–111)
Creatinine, Ser: 0.98 mg/dL (ref 0.44–1.00)
GFR, Estimated: >60 mL/min (ref >60)
Glucose, Bld: 138 mg/dL — ABNORMAL HIGH (ref 70–99)
Potassium: 3.2 mmol/L — ABNORMAL LOW (ref 3.5–5.1)
Sodium: 137 mmol/L (ref 135–145)
Total Bilirubin: 1 mg/dL (ref 0.0–1.2)
Total Protein: 7.2 g/dL (ref 6.5–8.1)

## 2024-10-09 LAB — CBC WITH DIFFERENTIAL/PLATELET
Abs Immature Granulocytes: 2.62 K/uL — ABNORMAL HIGH (ref 0.00–0.07)
Basophils Absolute: 0.2 K/uL — ABNORMAL HIGH (ref 0.0–0.1)
Basophils Relative: 1 %
Eosinophils Absolute: 0 K/uL (ref 0.0–0.5)
Eosinophils Relative: 0 %
HCT: 39.2 % (ref 36.0–46.0)
Hemoglobin: 12 g/dL (ref 12.0–15.0)
Immature Granulocytes: 14 %
Lymphocytes Relative: 9 %
Lymphs Abs: 1.7 K/uL (ref 0.7–4.0)
MCH: 27.4 pg (ref 26.0–34.0)
MCHC: 30.6 g/dL (ref 30.0–36.0)
MCV: 89.5 fL (ref 80.0–100.0)
Monocytes Absolute: 0.2 K/uL (ref 0.1–1.0)
Monocytes Relative: 1 %
Neutro Abs: 13.6 K/uL — ABNORMAL HIGH (ref 1.7–7.7)
Neutrophils Relative %: 75 %
Platelets: 168 K/uL (ref 150–400)
RBC: 4.38 MIL/uL (ref 3.87–5.11)
RDW: 16.8 % — ABNORMAL HIGH (ref 11.5–15.5)
Smear Review: NORMAL
WBC: 18.2 K/uL — ABNORMAL HIGH (ref 4.0–10.5)
nRBC: 0 % (ref 0.0–0.2)

## 2024-10-09 LAB — GROUP A STREP BY PCR: Group A Strep by PCR: NOT DETECTED

## 2024-10-09 LAB — RESP PANEL BY RT-PCR (RSV, FLU A&B, COVID)  RVPGX2
Influenza A by PCR: NEGATIVE
Influenza B by PCR: NEGATIVE
Resp Syncytial Virus by PCR: NEGATIVE
SARS Coronavirus 2 by RT PCR: NEGATIVE

## 2024-10-09 LAB — LACTIC ACID, PLASMA: Lactic Acid, Venous: 1.9 mmol/L (ref 0.5–1.9)

## 2024-10-09 MED ORDER — AZITHROMYCIN 500 MG PO TABS
500.0000 mg | ORAL_TABLET | Freq: Once | ORAL | Status: AC
  Administered 2024-10-09: 500 mg via ORAL
  Filled 2024-10-09: qty 1

## 2024-10-09 MED ORDER — AZITHROMYCIN 250 MG PO TABS: ORAL_TABLET | ORAL | 0 refills | Status: AC

## 2024-10-09 NOTE — ED Provider Notes (Signed)
 [facility] Provider Note   Event Date/Time   First MD Initiated Contact with Patient 10/09/24 1348     (approximate) History  Fever and Sore Throat  HPI Sandra Fischer is a 73 y.o. female currently under treatment for endometrial cancer with last infusion of chemotherapy 3 days prior to arrival who presents complaining of sore throat, mildly elevated temperature of 99, and nausea and vomiting that has been well-controlled with her at home antiemetics.  Patient denies any recent travel, sick contacts, or food at the ordinary.  Patient also endorses pain along the left mandibular molar that has a crown.  Patient states that this tooth is intermittently painful and usually worsens whenever she is sick. ROS: Patient currently denies any vision changes, tinnitus, difficulty speaking, facial droop, chest pain, shortness of breath, abdominal pain, diarrhea, dysuria, or weakness/numbness/paresthesias in any extremity   Physical Exam  Triage Vital Signs: ED Triage Vitals  Encounter Vitals Group     BP 10/09/24 1351 (!) 154/70     Girls Systolic BP Percentile --      Girls Diastolic BP Percentile --      Boys Systolic BP Percentile --      Boys Diastolic BP Percentile --      Pulse Rate 10/09/24 1351 81     Resp 10/09/24 1351 18     Temp 10/09/24 1351 99.6 F (37.6 C)     Temp Source 10/09/24 1351 Oral     SpO2 10/09/24 1351 99 %     Weight 10/09/24 1350 206 lb (93.4 kg)     Height 10/09/24 1350 5' 2 (1.575 m)     Head Circumference --      Peak Flow --      Pain Score 10/09/24 1349 6     Pain Loc --      Pain Education --      Exclude from Growth Chart --    Most recent vital signs: Vitals:   10/09/24 1351  BP: (!) 154/70  Pulse: 81  Resp: 18  Temp: 99.6 F (37.6 C)  SpO2: 99%   General: Awake, oriented x4. CV:  Good peripheral perfusion. Resp:  Normal effort. Abd:  No distention. Other:  Elderly obese African-American female resting comfortably in  no acute distress.  Erythematous posterior oropharynx ED Results / Procedures / Treatments  Labs (all labs ordered are listed, but only abnormal results are displayed) Labs Reviewed  COMPREHENSIVE METABOLIC PANEL WITH GFR - Abnormal; Notable for the following components:      Result Value   Potassium 3.2 (*)    Glucose, Bld 138 (*)    BUN 25 (*)    All other components within normal limits  CBC WITH DIFFERENTIAL/PLATELET - Abnormal; Notable for the following components:   WBC 18.2 (*)    RDW 16.8 (*)    Neutro Abs 13.6 (*)    Basophils Absolute 0.2 (*)    Abs Immature Granulocytes 2.62 (*)    All other components within normal limits  RESP PANEL BY RT-PCR (RSV, FLU A&B, COVID)  [id number]  GROUP A STREP BY PCR  LACTIC ACID, PLASMA   RADIOLOGY ED MD interpretation: One-view portable chest x-ray interpreted by me shows no evidence of acute abnormalities including no pneumonia, pneumothorax, or widened mediastinum - All radiology independently interpreted and agree with radiology assessment Official radiology report(s): DG Chest Port 1 View Result Date: 10/09/2024 CLINICAL DATA:  fever, chemo pt EXAM: PORTABLE CHEST 1 VIEW COMPARISON:  11/18/2023. FINDINGS: Bilateral lung fields are clear. Bilateral costophrenic angles are clear. Normal cardio-mediastinal silhouette. No acute osseous abnormalities. The soft tissues are within normal limits. IMPRESSION: No active disease. Electronically Signed   By: Ree Molt M.D.   On: 10/09/2024 15:31   PROCEDURES: Critical Care performed: No Procedures MEDICATIONS ORDERED IN ED: Medications  azithromycin  (ZITHROMAX ) tablet 500 mg (has no administration in time range)   IMPRESSION / MDM / ASSESSMENT AND PLAN / ED COURSE  I reviewed the triage vital signs and the nursing notes.                             The patient is on the cardiac monitor to evaluate for evidence of arrhythmia and/or significant heart rate changes. Patient's presentation  is most consistent with acute presentation with potential threat to life or bodily function. Patient is a 73 year old female with the above-stated past medical history that presents for mildly increased temperature, sore throat, and nausea/vomiting after receiving chemotherapy 3 days prior to arrival.\ DDx: Pharyngitis, peritonsillar abscess, gastroenteritis, influenza, COVID Plan: CBC, CMP, RVP, strep, lactic acid Clinical Course as of 10/09/24 1547  Sun Oct 09, 2024  1530 S/o: 59F hx endometrial cancer on carboplatin   - pharyngitis  - s/p filgrastin, leukocytosis - low grade temp  Viral panel, cxr pending [MM]  1546 Laboratory evaluation has come back without any evidence of acute abnormalities.  Patient does have leukocytosis to 18 likely secondary to pegfilgrastim  that was given during her chemotherapy 4 days prior to arrival.  Patient has no temperature over 100.4 in our emergency department.  Patient is negative for COVID/flu/RSV/strep.  Given the evidence of pharyngitis on exam, I will cover empirically with azithromycin .  Patient agrees to plan for discharge at this time and follow-up with Dr. Babara as needed.  Dispo: Discharge home with PCP and oncology follow-up [EB]    Clinical Course User Index [EB] Jossie Rondle Lohse K, MD [MM] Clarine Ozell LABOR, MD   FINAL CLINICAL IMPRESSION(S) / ED DIAGNOSES   Final diagnoses:  Acute pharyngitis, unspecified etiology   Rx / DC Orders   ED Discharge Orders          Ordered    azithromycin  (ZITHROMAX  Z-PAK) 250 MG tablet        10/09/24 1547           Note:  This document was prepared using Dragon voice recognition software and may include unintentional dictation errors.   Rishab Stoudt K, MD 10/09/24 [phone]

## 2024-10-09 NOTE — ED Notes (Signed)
 Pt verbalizes understanding of discharge instructions. Opportunity for questioning and answers were provided. Pt discharged from ED to home with family.

## 2024-10-09 NOTE — ED Triage Notes (Signed)
 Patient c/o sore throat, bottom left tooth pain, sweats and N/V.  Reports she is cancer patient and last had treatment Thursday. Reports having endometrial cancer

## 2024-10-11 ENCOUNTER — Telehealth: Payer: Self-pay | Admitting: *Deleted

## 2024-10-11 ENCOUNTER — Other Ambulatory Visit: Payer: Self-pay | Admitting: Oncology

## 2024-10-11 NOTE — Telephone Encounter (Signed)
 The patient called and said that she wants to stop the chemo that she is on and to have Dr. Babara to find her another chemo that will not make her as sick as she is.  He says that she went to the ER and she had Pharyngitis, I gave her a Z-Pak for that, has been hurting all the times and the bones, get bronchitis, she had COVID.   She also says that she supposed to have a CT scan tomorrow and wanted to know if she should still do it.

## 2024-10-12 ENCOUNTER — Ambulatory Visit

## 2024-10-12 ENCOUNTER — Inpatient Hospital Stay

## 2024-10-12 ENCOUNTER — Encounter: Payer: Self-pay | Admitting: Oncology

## 2024-10-12 ENCOUNTER — Ambulatory Visit
Admission: RE | Admit: 2024-10-12 | Discharge: 2024-10-12 | Disposition: A | Discharge status: Home / Self Care | Source: Ambulatory Visit | Attending: Oncology | Admitting: Oncology

## 2024-10-12 ENCOUNTER — Ambulatory Visit: Admission: RE | Admit: 2024-10-12

## 2024-10-12 DIAGNOSIS — C541 Malignant neoplasm of endometrium: Secondary | ICD-10-CM | POA: Insufficient documentation

## 2024-10-12 DIAGNOSIS — R911 Solitary pulmonary nodule: Secondary | ICD-10-CM | POA: Diagnosis present

## 2024-10-12 MED ORDER — IOHEXOL 300 MG/ML  SOLN
100.0000 mL | Freq: Once | INTRAMUSCULAR | Status: AC | PRN
  Administered 2024-10-12: 100 mL via INTRAVENOUS

## 2024-10-12 NOTE — Telephone Encounter (Signed)
 I was able to talk to patient this morning she said she turns off her phone when she tries to get a nap. Pt  states that she does not have Covid and she called scheduling yesterday to confirm that she could get scan done and they told her she could get scan done. Pt is already taking premeds for dye allergy. Pt would like a call to let her know if she can still keep CT app today

## 2024-10-12 NOTE — Telephone Encounter (Signed)
 CT has been r/s to 10/15. Pt will see Gyn onc and Dr. Babara on 10/22. Pt aware

## 2024-10-13 ENCOUNTER — Other Ambulatory Visit

## 2024-10-19 ENCOUNTER — Inpatient Hospital Stay: Admitting: Obstetrics and Gynecology

## 2024-10-19 ENCOUNTER — Telehealth: Payer: Self-pay | Admitting: Oncology

## 2024-10-19 ENCOUNTER — Other Ambulatory Visit: Payer: Self-pay

## 2024-10-19 ENCOUNTER — Encounter: Payer: Self-pay | Admitting: Oncology

## 2024-10-19 ENCOUNTER — Inpatient Hospital Stay (HOSPITAL_BASED_OUTPATIENT_CLINIC_OR_DEPARTMENT_OTHER): Admitting: Oncology

## 2024-10-19 ENCOUNTER — Encounter: Payer: Self-pay | Admitting: Obstetrics and Gynecology

## 2024-10-19 ENCOUNTER — Telehealth: Payer: Self-pay | Admitting: *Deleted

## 2024-10-19 VITALS — BP 119/51 | HR 74 | Resp 17 | Wt 207.7 lb

## 2024-10-19 DIAGNOSIS — C541 Malignant neoplasm of endometrium: Secondary | ICD-10-CM

## 2024-10-19 DIAGNOSIS — C7989 Secondary malignant neoplasm of other specified sites: Secondary | ICD-10-CM | POA: Diagnosis not present

## 2024-10-19 DIAGNOSIS — Z5111 Encounter for antineoplastic chemotherapy: Secondary | ICD-10-CM | POA: Diagnosis not present

## 2024-10-19 DIAGNOSIS — R590 Localized enlarged lymph nodes: Secondary | ICD-10-CM

## 2024-10-19 DIAGNOSIS — Z8542 Personal history of malignant neoplasm of other parts of uterus: Secondary | ICD-10-CM | POA: Diagnosis not present

## 2024-10-19 DIAGNOSIS — R599 Enlarged lymph nodes, unspecified: Secondary | ICD-10-CM

## 2024-10-19 NOTE — Addendum Note (Signed)
 Addended by: MAURIE QUAKER D on: 10/19/2024 01:15 PM   Modules accepted: Orders

## 2024-10-19 NOTE — Progress Notes (Signed)
 Gynecologic Oncology Interval Visit   Referring Provider: Dr. Janit  Chief Complaint: Recurrent stage IIIC-1 serous endometrial cancer  Subjective:  Sandra Fischer is a 73 y.o. G75P0 female (s/p laparotomy myomectomy for leiomyoma), initially seen in consultation from Dr. Janit, diagnosed with stage IIIC-1 serous endometrial cancer s/p TLH/BSO, SLN with minilap, declined adjuvant chemo & rad, developed recurrence 10/20/2019 with adenopathy, s/p 6 cycles carbo-taxol  chemotherapy with complete response; recurrence 06/09/24 based on CT C/A/P showing newly enlarged left retroperitoneal lymph node who returns to clinic for discussion of recent biopsy.   She returns to clinic for pelvic exam. She last say Dr Babara on 10/06/2024. She is on carboplatin  Taxol  with GCSF and received cycle #3 on 10/06/2024. She is struggling with the treatment and side effects. She is not having symptoms from the cancer. Her prior c/o back pain has resolved, but it is uncertain if her pain was from the disease.   10/13/2024 CT C/A/P  IMPRESSION: Stable mild retroperitoneal lymphadenopathy is again seen in the left paraaortic region, with largest lymph node measuring 2.1 cm short axis. This shows no significant change compared to prior study. No new or progressive metastatic disease within the chest, abdomen, or pelvis. Colonic diverticulosis, without radiographic evidence of diverticulitis. Tiny nonobstructing left renal calculus.   06/21/2024 Tumor biomarkers MLH1:  Preserved nuclear expression  MSH2:  Preserved nuclear expression  MSH6:  Preserved nuclear expression  PMS2:  Preserved nuclear expression     Her2 (1+)   Gynecologic Oncology History:  She initially presented to Dr. Janit as [phone] female with complaint of postmenopausal bleeding, intermittent, heavy at times.    Transabdominal ultrasound on 05/05/2019 demonstrated endometrium measuring 22mm. Uterus anteverted measuring 11.3 x 6.6 x 7.9 cm, heterogeous  echo texture w/o evidence of focal masses. Within uterus multiple suspected fibroids measuring 7.2 x 4.8 x 6.6 cm and 2.6 x 1.7 x 3.1 cm. Ovaries not visualized. No adnexal masses. No free fluid in cul de sac.   Endometrial biopsy was performed on 05/20/2019 and demonstrated high-grade mixed endometrioid, predominantly, and serous carcinoma.  She complains of persistent bleeding and fatigue which she attributes to the bleeding. She presents today for management. She has significant medical issues including rheumatoid arthritis requiring chronic steroids (5-10 mg daily) for a long time (she does not know how long) and leflunomide (ARAVA). She also has undergone myomectomy for leiomyoma and abdominoplasty.  She also has a history of cardiac disease. She Cardioversion for SVT on 10/17/2018. Her cardiologist Dr. Ammon. The patient presented to [facility] on 10/17/2018 for chest pain and shortness of breath, noted to be in SVT, converted to sinus rhythm with adenosine , in the setting of colitis, anemia with hemoglobin 8.5, and untreated hypothyroidism with TSH 25, which has since returned to normal range.  2D echocardiogram revealed moderately reduced LV function with LVEF 35 to 40% with diffuse hypokinesis. The patient underwent Lexiscan Myoview on 01/19/2019, which revealed revealed LVEF 49%, a mild fixed inferior wall defect, scar versus artifact, with no evidence of ischemia. She was last seen on 04/26/2019 by Cardiology Romero Pierre) with a plan to stay on her current medications and she was counseled about low sodium diet, DASH, and continuing hyperlipidemia medications.   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 She is married and provides care for her mother.   06/14/2019 CT C/A/P IMPRESSION: 1. Bulky fibroid uterus. There is no direct noncontrast CT evidence of endometrial malignancy. No evidence of lymphadenopathy or metastatic disease in the chest,  abdomen, or pelvis. Please note that noncontrast CT  is limited for the evaluation of solid organ metastases. 2. Stable 4 mm ground-glass pulmonary nodule of the left pulmonary apex (series 3, image 24). Although nonspecific, this is an unlikely isolated manifestation of metastatic disease. Attention on follow-up.  3. Chronic, incidental, and postoperative findings as detailed above.  She underwent TLH_BSO with Dr. Augustin on 06/21/2019.   Tumor size 9.3 cm, invading 99% of myometrium (3.55 of 3.6 cm), positive right external iliac sentinel lymph node. Negative washings. MSS/MMR - Intact/normal. HER2 - negative at 28%/1+.   Case was discussed at [facility] Tumor Board on 07/04/2019. Possible PORTEC3 regimen given subgroup analysis showing benefit in serous history vs clinical trial was discussed.   Based on pathology, adjuvant chemotherapy & radiation was recommended. She saw Dr. Babara on 07/14/2019 and lengthy discussion regarding rationale of adjuvant treatment. She declined adjuvant treatment.  10/20/2019- CT Chest Abdomen Pelvis W Contrast Multiple newly enlarged retroperitoneal and right iliac lymph nodes, the largest left retroperitoneal node measuring 1.9 x 1.6 cm (series 2, image 71), concerning for nodal metastatic disease. 3. Unchanged 4 mm ground-glass pulmonary nodule of the left pulmonary apex (series 3, image 24). This remains nonspecific although is again an unlikely manifestation of pulmonary metastatic disease.  Findings consistent with recurrent disease with adenopathy.   11/07/2019-02/20/20 - She agreed to treatment and received 6 cycles of carbo-taxol  chemotherapy  (dose reduced d/t neuropathy).   03/12/2020 CT Abdomen/Pelvis The multiple enlarged retroperitoneal and right iliac lymph nodes identified as new on the previous exam from 10/20/2019 have resolved completely in the interval. 2. Tiny ground-glass pulmonary nodule medial left lung apex is stable in the interval. Likely benign, continued attention on follow-up recommended.  Data  from GOG 258 was discussed including no differences in relapse free survival, fewer lower vaginal recurrences and fewer lower pelvic and para-aortic relapsed with the addition of RT. She was seen by Dr. Lenn who did not recommend WPRT.   Post chemotherapy CT showed excellent response. Previously enlarged retroperitoneal and right iliac nodes have resolved. Adjuvant radiation was not recommended.   CT 02/14/21 chest IMPRESSION: 1. Left apical 4 mm ground-glass nodule is unchanged, considered benign. 2. A right lower lobe solid 3 mm nodule is also not significantly changed and can be presumed benign. 3.  No acute process or evidence of metastatic disease in the chest. 4.  Aortic Atherosclerosis (ICD10-I70.0). 5. Mild hepatic steatosis. 6.  Tiny hiatal hernia.  10/29/22 CT C/A/P scan  IMPRESSION: 1. Status post hysterectomy. 2. No noncontrast evidence of recurrent or metastatic disease in the chest, abdomen, or pelvis. 3. Unchanged tiny, benign pulmonary nodules, for which no specific further follow-up or characterization is required. 4. Somewhat coarse contour of the liver, suggestive of cirrhosis. Correlate with biochemical findings. 5. Nonobstructive left nephrolithiasis.  04/17/23- ER for SOB and chest pain. Thought to be bronchitis. Treated with antibiotics.   06/09/24- CT C/A/P for restaging showed newly enlarged left retroperitoneal lymph node measuring up to 2.5 x 2.2 cm consistent with nodal metastatic disease. Occasional tiny pulmonary nodules unchanged and favor benign etiology. Nonobstructive left nephrolithiasis.   06/21/24- Pathology:  1. Soft tissue mass, biopsy, periaortic left sided mass :  - METASTATIC POORLY DIFFERENTIATED CARCINOMA.  Diagnosis Note : The specimen demonstrates a pleomorphic high-grade epithelioid proliferation. Immunohistochemical stains were performed to characterize the tumor cells. The cells are positive for CK AE1/AE3, GATA3, CDX2, and p40. P53 is  mutant type of staining.  MLH1:  Preserved nuclear expression  MSH2:  Preserved nuclear expression  MSH6:  Preserved nuclear expression  PMS2:  Preserved nuclear expression     Her2 (1+)   06/29/24- Started second line chemotherapy with paclitaxel  and carboplatin   CA 125 10/06/2024  12.1 06/17/24 12.6 03/07/24 10.8 08/14/23 12.6 02/04/23  15.6 08/01/22  11.6 01/13/22 11.1 07/08/21  11.4 01/02/21  12.6 09/10/20 11.2 07/03/20  13.6 03/14/20 18.9 11/07/19  13  Problem List: Patient Active Problem List   Diagnosis Date Noted   Acute bronchitis 09/09/2024   Vaginal candidiasis 09/09/2024   Hypokalemia 08/19/2024   Neutropenia 07/19/2024   Lung nodule 02/09/2023   Age-related osteoporosis without current pathological fracture 04/22/2022   Diabetes mellitus without complication (HCC) 04/03/2021   Nuclear sclerotic cataract of right eye 04/03/2021   Severe persistent asthma (HCC) 04/03/2021   Problem related to unspecified psychosocial circumstances 04/03/2021   Corn of foot 04/03/2021   Dependence on continuous positive airway pressure ventilation 04/03/2021   Dermatophytosis of nail 04/03/2021   Type 2 or unspecified type diabetes mellitus 04/03/2021   Disorder of bone and cartilage 04/03/2021   Dysphagia 04/03/2021   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   History of recurrent pneumonia 04/03/2021   Irritable colon 04/03/2021   Low back pain 04/03/2021   Other alveolar and parietoalveolar pneumonopathy 04/03/2021   Other and unspecified hyperlipidemia 04/03/2021   Other specified disease of nail 04/03/2021   Pain in joint, ankle and foot 04/03/2021   Polyp of colon 04/03/2021   Seborrheic dermatitis 04/03/2021   SVT (supraventricular tachycardia) 04/03/2021   Vitiligo 04/03/2021   Vocal cord dysfunction 04/03/2021   Obesity 04/03/2021   Allergic asthma 04/03/2021   Obstructive sleep apnea 04/03/2021   Encounter for long-term (current) use of steroids 04/03/2021   Diabetic  neuropathy (HCC) 08/02/2020   Iron  deficiency anemia 07/03/2020   Vaginal discharge 05/30/2020   Encounter for antineoplastic chemotherapy 01/30/2020   Neuropathy due to chemotherapeutic drug 01/30/2020   Anemia 01/30/2020   Gastroesophageal reflux disease 12/08/2019   Microcytic anemia 11/07/2019   Port-A-Cath in place 11/04/2019   Rheumatoid arthritis (HCC) 10/29/2019   Goals of care, counseling/discussion 07/20/2019   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   Acute blood loss anemia 06/22/2019   Hypothyroidism, adult 06/15/2019   Myalgia and myositis 06/15/2019   Fibromyalgia    Cardiomyopathy (HCC) 12/06/2018   Atypical chest pain 12/06/2018   Perennial and seasonal allergic rhinitis 02/09/2018   Moderate persistent asthma 02/09/2018   Allergic conjunctivitis 02/09/2018   History of food allergy 02/09/2018   Allergic reaction 02/09/2018   RA (rheumatoid arthritis) (HCC) 08/22/2016   HTN (hypertension) 08/22/2016   Past Medical History: Past Medical History:  Diagnosis Date   Asthma    Asthma    Asthma exacerbation 08/22/2016   Cancer (HCC)    Collagen vascular disease    Diabetes mellitus without complication (HCC)    History of Diabetes    Dyspnea    Environmental allergies    Fibromyalgia    High cholesterol    Hypertension    Hypothyroidism    Iron  deficiency anemia 07/03/2020   Neuropathy    RA (rheumatoid arthritis) (HCC)    Sleep apnea    Thyroid  disease    Past Surgical History: Past Surgical History:  Procedure Laterality Date   ABDOMINAL HYSTERECTOMY     CATARACT EXTRACTION     CHOLECYSTECTOMY     COLONOSCOPY WITH PROPOFOL  N/A 03/14/2022   Procedure: COLONOSCOPY WITH PROPOFOL ;  Surgeon:  Maryruth Ole DASEN, MD;  Location: [facility] ENDOSCOPY;  Service: Endoscopy;  Laterality: N/A;  DM   CYSTOURETHROSCOPY     DIAGNOSTIC LAPAROSCOPY     EYE SURGERY     fibroids removed     PORTA CATH REMOVAL N/A 02/14/2021   Procedure: PORTA CATH REMOVAL;   Surgeon: Marea Selinda RAMAN, MD;  Location: [facility] INVASIVE CV LAB;  Service: Cardiovascular;  Laterality: N/A;   THYROIDECTOMY, PARTIAL     tummy tuck     Past Gynecologic History:  As per HPI  OB History:  OB History  Gravida Para Term Preterm AB Living  2    2   SAB IAB Ectopic Multiple Live Births  2        # Outcome Date GA Lbr Len/2nd Weight Sex Type Anes PTL Lv  2 SAB           1 SAB             Family History: Family History  Problem Relation Age of Onset   Diabetes Mother    Hypertension Mother    Hyperlipidemia Mother    Dementia Mother    Breast cancer Neg Hx    Ovarian cancer Neg Hx    Colon cancer Neg Hx    Social History: Social History   Socioeconomic History   Marital status: Married    Spouse name: Cedric    Number of children: Not on file   Years of education: Not on file   Highest education level: Not on file  Occupational History   Occupation: Retired  Tobacco Use   Smoking status: Former    Current packs/day: 0.00    Average packs/day: 0.1 packs/day for 0.2 years    Types: Cigarettes    Start date: 09/1978    Quit date: 1980    Years since quitting: 45.8   Smokeless tobacco: Never  Vaping Use   Vaping status: Never Used  Substance and Sexual Activity   Alcohol use: No   Drug use: No   Sexual activity: Yes    Birth control/protection: Post-menopausal  Other Topics Concern   Not on file  Social History Narrative   Not on file   Social Drivers of Health   Financial Resource Strain: Not on file  Food Insecurity: Not on file  Transportation Needs: Not on file  Physical Activity: Not on file  Stress: Not on file  Social Connections: Not on file  Intimate Partner Violence: Not on file   Immunization History  Administered Date(s) Administered   Fluad Quad(high Dose 65+) 11/04/2021   INFLUENZA, HIGH DOSE SEASONAL PF 10/03/2019   Influenza, Seasonal, Injecte, Preservative Fre 03/01/2013, 09/23/2013, 01/21/2016   Influenza,inj,Quad PF,6+  Mos 10/22/2016, 12/03/2017   Influenza-Unspecified 10/29/2000, 12/29/2000, 10/26/2008, 10/29/2008, 01/27/2012   PFIZER Comirnaty(Gray Top)Covid-19 Tri-Sucrose Vaccine 01/08/2021   PFIZER(Purple Top)SARS-COV-2 Vaccination 04/19/2020, 05/10/2020   Pneumococcal Conjugate-13 09/23/2013   Pneumococcal Polysaccharide-23 12/29/2000   Allergies: Allergies  Allergen Reactions   Abatacept Hives   Codeine Hives, Nausea And Vomiting and Nausea Only   Iodine Swelling   Latex Itching, Hives and Rash   Shellfish Allergy Swelling   Trimethoprim  Hydrochloride [Trimethoprim ] Other (See Comments)    Pancreatitis   Metformin Diarrhea   Penicillins Nausea And Vomiting   Review of Systems General:  fatigue, weakness Skin: no complaints Eyes: no complaints HEENT: no complaints Pulmonary: cough Cardiac: prior h/o leg swelling Gastrointestinal: no complaints Genitourinary/Sexual: no complaints Ob/Gyn: no complaints Musculoskeletal: back pain Hematology: no complaints  Neurologic/Psych: no complaints  Objective:  Physical Examination:  BP (!) 119/51   Pulse 74   Resp 17   Wt 207 lb 11.2 oz (94.2 kg)   SpO2 96%   BMI 37.99 kg/m     GENERAL: Patient is a well appearing female in no acute distress HEENT:  Atraumatic and normocephalic, neck supple. CHEST: IV port site normal  NODES:  No cervical, supraclavicular, axillary, or inguinal lymphadenopathy palpated.  LUNGS:  normal respiratory rate  ABDOMEN:  Soft, nontender. Protuberant. No masses/ascites/hernia/or hepatomegaly.  EXTREMITIES:  No significant peripheral edema.   NEURO:  Nonfocal. Well oriented.  Appropriate affect.  Pelvic: EGBUS: no lesions; no significant erythema Cervix: surgically absent Vagina: no lesions, no discharge or bleeding Uterus: surgically absent BME: no palpable masses Rectovaginal: deferred  Labs: Lab Results  Component Value Date   WBC 18.2 (H) 10/09/2024   HGB 12.0 10/09/2024   HCT 39.2 10/09/2024    MCV 89.5 10/09/2024   PLT 168 10/09/2024     Chemistry      Component Value Date/Time   NA 137 10/09/2024 1424   K 3.2 (L) 10/09/2024 1424   CL 99 10/09/2024 1424   CO2 26 10/09/2024 1424   BUN 25 (H) 10/09/2024 1424   CREATININE 0.98 10/09/2024 1424   CREATININE 0.83 10/06/2024 0827      Component Value Date/Time   CALCIUM 9.2 10/09/2024 1424   ALKPHOS 78 10/09/2024 1424   AST 24 10/09/2024 1424   AST 20 10/06/2024 0827   ALT 20 10/09/2024 1424   ALT 18 10/06/2024 0827   BILITOT 1.0 10/09/2024 1424   BILITOT 0.5 10/06/2024 0827       Radiologic Imaging:  Per HPI    Assessment:  Sandra Fischer is a 73 y.o. 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  Tumor size 9.3 cm, invading 99% of myometrium (3.55 of 3.6cm), positive right external iliac sentinel lymph nodes. Negative washings. Recurrent disease 10/20/2019 with adenopathy up to 1.9 x 1.6 cm with CR to chemotherapy with paclitaxel /carboplatin  completed 01/2020.  Normal exam today.   CA125 normal in 2/24, but was not elevated at recurrence. Last CT scan 10/2022 reassuring and no definitive evidence of disease. Small pulmonary nodules. 04/29/23 CT again stable without specific findings of recurrent disease. 05/2024 CT scan recurrent disease with retroperitoneal adenopathy biopsies proven. 06/2024 -09/2024 3 cycles of carboplatin /paclitaxel  therapy.   Side effect from chemotherapy and Growth factor support  H/o Grade 2 peripheral neuropathy.    Patient had COVID-19 in 10/22 and again in 2024.   Medical co-morbidities complicating care: She has significant medical issues including rheumatoid arthritis requiring chronic steroids (5-10 mg daily) for a long time (she does not know how long) and leflunomide (ARAVA); HTN; Dilated cardiomyopathy; hyperlipidemia; multiple  intra-abdominal surgeries (myomectomy for leiomyoma, cholecystectomy, and abdominoplasty) ; obesity Body mass index is 37.99 kg/m.  Plan:   Problem List Items Addressed This Visit       Genitourinary   Recurrent carcinoma of endometrium (HCC) - Primary (Chronic)   Other Visit Diagnoses       Adenopathy           She will continue to discuss Dr. Babara regarding chemotherapy options. Consider further dose reduction and extending cycles to every four weeks; and can stop growth factor support. Another option for chemotherapy holiday and active surveillance to  allow her to recover from treatment side effects.   Immunotherapy was not felt to be a good option considering symptomatic RA.  Follow up in 3 months with CT scan C/A/P and pelvic exam.   Consider NGS testing on her most recent specimen. Rayfield Jasmine, RN Navigator will discuss with Dr. Babara.   The patient's diagnosis, an outline of the further diagnostic and laboratory studies which will be required, the recommendation, and alternatives were discussed.  All questions were answered to the patient's satisfaction.  I personally had a face to face interaction and evaluated the patient. I have reviewed her history and available records and have performed the physical exam.  I have discussed the case with the patient.   Joniqua Sidle Isidor Constable, MD

## 2024-10-19 NOTE — Assessment & Plan Note (Addendum)
 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 Patient with developed recurrent disease, retroperitoneal mass-> biopsy proven recurrence. pMMR status, HER2 1+  Labs are reviewed and discussed with patient.  S/p 3 cycles  carboplatin  AUC 4.5 Taxol  135mg /m2,with GCSF Repeat CT scan showed stable disease, slight decrease of retroperitoneal mass 2.5cm --> 2.1cm Patient has had moderate difficulties while on chemotherapy. We discussed about options of chemotherapy break, monitor disease versus further dose reduction treatment and increase of interval between treatments. Patient initially was not interested in proceeding treatment and plan to get a second opinion from the [place]. she called back later on and expressed desire of continuing chemotherapy. Plan dose reduced carboplatin  AUC 4 and Taxol  100 mg/m2.  Hold off G-CSF due to severe bone pain

## 2024-10-19 NOTE — Progress Notes (Signed)
 Hematology/Oncology Progress note [phone] [phone]    CHIEF COMPLAINTS/REASON FOR VISIT:  Follow up for serous endometrial cancer  ASSESSMENT & PLAN:   Recurrent carcinoma of endometrium (HCC) Recurrent endometrial cancer, TP 53 + Status post 6 cycles of chemotherapy with carboplatin  and Taxol . Patient with developed recurrent disease, retroperitoneal mass-> biopsy proven recurrence. pMMR status, HER2 1+  Labs are reviewed and discussed with patient.  S/p 3 cycles  carboplatin  AUC 4.5 Taxol  135mg /m2,with GCSF Repeat CT scan.    Patient declined influenza vaccination today, plan to get it at [place].   Orders Placed This Encounter  Procedures   CBC with Differential (Cancer Center Only)    Standing Status:   Future    Expected Date:   10/27/2024    Expiration Date:   10/27/2025   CMP (Cancer Center only)    Standing Status:   Future    Expected Date:   10/27/2024    Expiration Date:   10/27/2025   Follow-up 3 weeks All questions were answered. The patient knows to call the clinic with any problems, questions or concerns.  [name], MD, PhD [facility] Hematology Oncology 10/19/2024   HISTORY OF PRESENTING ILLNESS:   Sandra Fischer is a  73 y.o.  female presents for high-grade serous endometrial cancer Oncology History  Recurrent carcinoma of endometrium (HCC)  07/15/2019 Initial Diagnosis   Endometrial cancer  Patient had TH/BSO sentinel lymph node biopsy by [facility]YN oncology Dr. Augustin on 06/21/2019. Extensive medical records from [facility] health system review was performed by me.  Tumor size 9.3 cm, invading 99% of myometrium [3.55 out of 3.6 cm], positive right external iliac sentinel lymph nodes.  Negative washing.  No LVI, serosa is uninvolved. 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 [id number]  FIGO stage IIIC1 MSI intact HER 2 negative.   her case was discussed at [facility] tumor board on 07/04/2019   PORTEC-3 regimen was recommended given subgroup analysis showing benefit in the serous histology  Patient declined treatment.     10/20/2019 Relapse/Recurrence   CT chest abdomen pelvis with contrast showed disease recurrence. There are multiple newly enlarged retroperitoneal and right iliac lymph nodes the largest the left retroperitoneal node measuring 1.9 x 1.6 cm concerning for nodal metastasis disease. Unchanged 4 mm groundglass pulmonary nodule of the left pulmonary apex. This remains nonspecific.    11/07/2019 - 02/20/2020 Chemotherapy   carboplatin  AUC 4.5 and paclitaxel  175 mg/m2 x 2 cycles carboplatin  AUC4.5 and paclitaxel  135 mg/m2 x 4 cycles   03/12/2020 Imaging   CT chest abdomen pelvis with contrast showed 1. The multiple enlarged retroperitoneal and right iliac lymph nodes identified as new on the previous exam from 10/20/2019 have resolved completely in the interval. 2. Tiny ground-glass pulmonary nodule medial left lung apex is stable in the interval. Likely benign, continued attention on follow-up recommended. 3. No new or progressive interval findings. 4.  Aortic Atherosclerois (ICD10-170.0   06/07/2024 Imaging   CT chest abdomen pelvis with contrast showed 1. Newly enlarged left retroperitoneal lymph nodes measuring up to 2.5 x 2.2 cm, consistent with nodal metastatic disease. 2. Occasional tiny pulmonary nodules unchanged, most likely benign and incidental. 3. Status post hysterectomy. 4. Nonobstructive left nephrolithiasis.   06/21/2024 Relapse/Recurrence   Periaortic left side soft tissue mass biopsy showed metastatic poorly differentiated carcinoma.    Diagnosis Note : The specimen demonstrates a pleomorphic high-grade epithelioid proliferation. Immunohistochemical stains were performed to characterize the tumor cells. The cells are  positive for CK AE1/AE3, GATA3, CDX2, and p40. P53 is mutant type of staining. The cells are negative for PAX8, TTF-1, WT1, ER,  -PR, S100 and SOX10. The immunoprofile is not specific and is not indicative of a specific site of primary. The patient's history of endometrial carcinoma is notes. The tumor most likely represents a metastasis from the patient's known endometrial carcinoma since metastatic tumor cells could aberrant loss or gain expression of lineage markers. However, metastasis from other organ system cannot be entirely excluded. Correlation with imaging findings is recommended. Controls worked appropriately.    07/08/2024 -  Chemotherapy   Patient is on Treatment Plan : UTERINE Carboplatin  + Paclitaxel  q21d       INTERVAL HISTORY Sandra Fischer is a 73 y.o. female who has above history reviewed by me today presents for follow up visit for management of endometrial cancer.  Discussed the use of AI scribe software for clinical note transcription with the patient, who gave verbal consent to proceed.   She experiences significant pain after injections, leaving her bedridden for two days despite using Claritin , which provides minimal relief.  She is concerned about the impact of chemotherapy on her immune system, leading to frequent infections since July 2025, including bronchitis, mild COVID-19, and pharyngitis.  She also has rheumatoid arthritis and is currently off Enbrel due to the immunosuppressive effects of chemotherapy. Previously, chemotherapy helped manage her rheumatoid symptoms, but now she feels her tolerance to chemotherapy is poor.  She is concerned that the chemotherapy drugs might vary between different batches.    Review of Systems  Constitutional:  Positive for fatigue. Negative for appetite change, chills and fever.  HENT:   Negative for hearing loss and voice change.   Eyes:  Negative for eye problems.  Respiratory:  Negative for chest tightness and cough.   Cardiovascular:  Negative for chest pain.  Gastrointestinal:  Negative for abdominal distention, abdominal pain, blood in stool and  diarrhea.  Endocrine: Negative for hot flashes.  Genitourinary:  Positive for vaginal discharge. Negative for difficulty urinating and frequency.   Musculoskeletal:  Negative for arthralgias.  Skin:  Negative for itching and rash.  Neurological:  Positive for numbness. Negative for extremity weakness.  Hematological:  Negative for adenopathy.  Psychiatric/Behavioral:  Negative for confusion.     MEDICAL HISTORY:  Past Medical History:  Diagnosis Date   Asthma    Asthma    Asthma exacerbation 08/22/2016   Cancer (HCC)    Collagen vascular disease    Diabetes mellitus without complication (HCC)    History of Diabetes    Dyspnea    Environmental allergies    Fibromyalgia    High cholesterol    Hypertension    Hypothyroidism    Iron  deficiency anemia 07/03/2020   Neuropathy    RA (rheumatoid arthritis) (HCC)    Sleep apnea    Thyroid  disease     SURGICAL HISTORY: Past Surgical History:  Procedure Laterality Date   ABDOMINAL HYSTERECTOMY     CATARACT EXTRACTION     CHOLECYSTECTOMY     COLONOSCOPY WITH PROPOFOL  N/A 03/14/2022   Procedure: COLONOSCOPY WITH PROPOFOL ;  Surgeon: Maryruth Ole DASEN, MD;  Location: [facility] ENDOSCOPY;  Service: Endoscopy;  Laterality: N/A;  DM   CYSTOURETHROSCOPY     DIAGNOSTIC LAPAROSCOPY     EYE SURGERY     fibroids removed     PORTA CATH REMOVAL N/A 02/14/2021   Procedure: PORTA CATH REMOVAL;  Surgeon: Marea Selinda RAMAN, MD;  Location: [facility]  INVASIVE CV LAB;  Service: Cardiovascular;  Laterality: N/A;   THYROIDECTOMY, PARTIAL     tummy tuck      SOCIAL HISTORY: Social History   Socioeconomic History   Marital status: Married    Spouse name: Cedric    Number of children: Not on file   Years of education: Not on file   Highest education level: Not on file  Occupational History   Occupation: Retired  Tobacco Use   Smoking status: Former    Current packs/day: 0.00    Average packs/day: 0.1 packs/day for 0.2 years    Types: Cigarettes     Start date: 09/1978    Quit date: 1980    Years since quitting: 45.8   Smokeless tobacco: Never  Vaping Use   Vaping status: Never Used  Substance and Sexual Activity   Alcohol use: No   Drug use: No   Sexual activity: Yes    Birth control/protection: Post-menopausal  Other Topics Concern   Not on file  Social History Narrative   Not on file   Social Drivers of Health   Financial Resource Strain: Not on file  Food Insecurity: Not on file  Transportation Needs: Not on file  Physical Activity: Not on file  Stress: Not on file  Social Connections: Not on file  Intimate Partner Violence: Not on file    FAMILY HISTORY: Family History  Problem Relation Age of Onset   Diabetes Mother    Hypertension Mother    Hyperlipidemia Mother    Dementia Mother    Breast cancer Neg Hx    Ovarian cancer Neg Hx    Colon cancer Neg Hx     ALLERGIES:  is allergic to abatacept, codeine, iodine, latex, shellfish allergy, trimethoprim  hydrochloride [trimethoprim ], metformin, and penicillins.  MEDICATIONS:  Current Outpatient Medications  Medication Sig Dispense Refill   Acetaminophen  (TYLENOL  EXTRA STRENGTH PO) Take 1,300 mg by mouth 2 (two) times daily as needed.     ascorbic acid (VITAMIN C) 500 MG tablet Take 500 mg by mouth daily.     aspirin  EC 81 MG tablet Take 81 mg by mouth daily.     azelastine  (ASTELIN ) 0.1 % nasal spray Place 2 sprays into both nostrils 2 (two) times daily. 30 mL 5   Calcium Carbonate-Vit D-Min (CALCIUM 600+D PLUS MINERALS) 600-400 MG-UNIT TABS Take 1 tablet by mouth 2 (two) times daily.     cetirizine  (ZYRTEC ) 10 MG tablet Take 1 tablet (10 mg total) by mouth daily. 30 tablet 0   Cholecalciferol (D3 ADULT PO) Take 1 capsule by mouth daily.     clotrimazole-betamethasone (LOTRISONE) cream Apply topically 4 (four) times daily as needed. (Patient not taking: Reported on 10/19/2024)     desloratadine (CLARINEX) 5 MG tablet Take 5 mg by mouth daily.      dexamethasone  (DECADRON ) 4 MG tablet Take 2 tablets (8 mg total) by mouth daily for 3 days. Start the day after chemotherapy. Take with food. 30 tablet 1   empagliflozin (JARDIANCE) 25 MG TABS tablet Take 12.5 mg by mouth daily.     EPINEPHrine  0.3 mg/0.3 mL IJ SOAJ injection Use as directed for severe allergic reaction. (Patient not taking: Reported on 10/19/2024) 2 Device 1   etanercept (ENBREL) 25 MG injection Inject 25 mg into the skin. (Patient not taking: Reported on 10/19/2024)     fluconazole  (DIFLUCAN ) 150 MG tablet Take 150 mg (1 tablet) by mouth every 3 days for 3 doses then 150 mg (1 tablet)  once weekly for 7 weeks 10 tablet 0   fluticasone  (FLONASE ) 50 MCG/ACT nasal spray Place 1 spray into both nostrils 2 (two) times daily. 16 g 0   fluticasone -salmeterol (ADVAIR) 250-50 MCG/ACT AEPB Inhale 1 puff into the lungs in the morning and at bedtime.     furosemide (LASIX) 10 MG/ML solution Take by mouth daily.     gabapentin  (NEURONTIN ) 300 MG capsule Take 1 capsule (300 mg total) by mouth 2 (two) times daily. 60 capsule 0   hydrALAZINE (APRESOLINE) 25 MG tablet Take 25 mg by mouth 2 (two) times daily.     ipratropium (ATROVENT ) 0.02 % nebulizer solution Take 0.5 mg by nebulization every 4 (four) hours as needed.      leflunomide (ARAVA) 20 MG tablet Take 20 mg by mouth daily.     leflunomide (ARAVA) 20 MG tablet Take 1 tablet by mouth daily.     levothyroxine (SYNTHROID) 75 MCG tablet Take 75 mcg by mouth daily before breakfast.     lisinopril (PRINIVIL,ZESTRIL) 40 MG tablet Take 20 mg by mouth in the morning and at bedtime.      metoprolol succinate (TOPROL-XL) 50 MG 24 hr tablet Take 50 mg by mouth daily. Take with or immediately following a meal.     montelukast  (SINGULAIR ) 10 MG tablet Take 10 mg by mouth at bedtime.     nitroGLYCERIN (NITROSTAT) 0.4 MG SL tablet Place under the tongue. Place 1 tablet (0.4 mg total) under the tongue every 5 (five) minutes as needed for Chest pain May  take up to 3 doses.     nortriptyline (PAMELOR) 25 MG capsule Take 25 mg by mouth at bedtime.     nystatin  cream (MYCOSTATIN ) Apply 1 Application topically 2 (two) times daily. 30 g 3   olopatadine  (PATANOL) 0.1 % ophthalmic solution Place 1 drop into both eyes 2 (two) times daily. 5 mL 5   omeprazole (PRILOSEC) 10 MG capsule Take 20 mg by mouth daily.     ondansetron  (ZOFRAN ) 8 MG tablet Take 1 tablet (8 mg total) by mouth every 8 (eight) hours as needed for nausea or vomiting. Start on the third day after chemotherapy. (Patient not taking: Reported on 10/19/2024) 30 tablet 1   ondansetron  (ZOFRAN -ODT) 4 MG disintegrating tablet Take 1 tablet (4 mg total) by mouth every 8 (eight) hours as needed for nausea or vomiting. (Patient not taking: Reported on 10/19/2024) 15 tablet 0   potassium chloride  (KLOR-CON  M) 10 MEQ tablet Take 1 tablet (10 mEq total) by mouth daily. 30 tablet 0   pravastatin (PRAVACHOL) 40 MG tablet Take 40 mg by mouth daily.     predniSONE  (DELTASONE ) 5 MG tablet Take 5 mg by mouth daily with breakfast.     predniSONE  (DELTASONE ) 50 MG tablet Take 1 tablet (50 mg total) by mouth See admin instructions. Prednisone  - take 50 mg by mouth at 13 hours, 7 hours, and 1 hour before contrast media injection (Patient not taking: Reported on 10/19/2024) 3 tablet 0   predniSONE  (DELTASONE ) 50 MG tablet Take 1 tablet (50 mg total) by mouth See admin instructions. Prednisone  - take 50 mg by mouth at 13 hours, 7 hours, and 1 hour before contrast media injection, Take Benadryl  50mg  30 minutes prior to contrast (Patient not taking: Reported on 10/19/2024) 3 tablet 0   Prenatal Vit-Fe Fumarate-FA (MULTIVITAMIN-PRENATAL) 27-0.8 MG TABS tablet Take 1 tablet by mouth daily.      prochlorperazine  (COMPAZINE ) 10 MG tablet Take 1 tablet (10  mg total) by mouth every 6 (six) hours as needed for nausea or vomiting. (Patient not taking: Reported on 10/19/2024) 30 tablet 1   senna-docusate (SENOKOT-S) 8.6-50 MG  tablet Take 1 tablet by mouth at bedtime as needed for mild constipation. (Patient not taking: Reported on 10/19/2024)     sulfaSALAzine (AZULFIDINE) 500 MG tablet TAKE ONE TABLET BY MOUTH TWO TIMES A DAY FOR 14 DAYS, THEN TAKE TWO TABLETS TWO TIMES A DAY FOR 14 DAYS, THEN TAKE THREE TABLETS TWO TIMES A DAY FOR 62 DAYS FOR INFLAMMATION. TAKE AFTER MEALS     tacrolimus (PROTOPIC) 0.1 % ointment Apply 1 Application topically 2 (two) times daily.     tiotropium (SPIRIVA) 18 MCG inhalation capsule Place 18 mcg into inhaler and inhale daily. (Patient not taking: Reported on 10/19/2024)     verapamil (CALAN) 120 MG tablet Take 240 mg by mouth 2 (two) times daily.     No current facility-administered medications for this visit.     PHYSICAL EXAMINATION: ECOG PERFORMANCE STATUS: 1 - Symptomatic but completely ambulatory  Physical Exam Constitutional:      General: She is not in acute distress.    Appearance: She is obese.  HENT:     Head: Normocephalic and atraumatic.  Eyes:     General: No scleral icterus. Cardiovascular:     Rate and Rhythm: Normal rate and regular rhythm.     Heart sounds: Normal heart sounds.  Pulmonary:     Effort: Pulmonary effort is normal. No respiratory distress.     Breath sounds: Normal breath sounds. No wheezing.  Abdominal:     General: Bowel sounds are normal. There is no distension.     Palpations: Abdomen is soft.  Musculoskeletal:        General: Deformity present. Normal range of motion.     Cervical back: Normal range of motion and neck supple.  Skin:    General: Skin is warm and dry.     Findings: No erythema or rash.  Neurological:     Mental Status: She is alert and oriented to person, place, and time. Mental status is at baseline.  Psychiatric:     Comments: Feel frustrated.      LABORATORY DATA:  I have reviewed the data as listed    Latest Ref Rng & Units 10/09/2024    2:24 PM 10/06/2024    8:27 AM 09/09/2024    8:31 AM  CBC  WBC 4.0  - 10.5 K/uL 18.2  8.9  9.5   Hemoglobin 12.0 - 15.0 g/dL 87.9  87.7  88.0   Hematocrit 36.0 - 46.0 % 39.2  39.5  39.2   Platelets 150 - 400 K/uL 168  255  278       Latest Ref Rng & Units 10/09/2024    2:24 PM 10/06/2024    8:27 AM 09/09/2024    8:31 AM  CMP  Glucose 70 - 99 mg/dL 861  835  830   BUN 8 - 23 mg/dL 25  18  16    Creatinine 0.44 - 1.00 mg/dL 9.01  9.16  9.13   Sodium 135 - 145 mmol/L 137  140  140   Potassium 3.5 - 5.1 mmol/L 3.2  3.4  3.6   Chloride 98 - 111 mmol/L 99  103  108   CO2 22 - 32 mmol/L 26  26  24    Calcium 8.9 - 10.3 mg/dL 9.2  9.2  9.1   Total Protein 6.5 - 8.1  g/dL 7.2  7.3  7.3   Total Bilirubin 0.0 - 1.2 mg/dL 1.0  0.5  0.6   Alkaline Phos 38 - 126 U/L 78  67  88   AST 15 - 41 U/L 24  20  20    ALT 0 - 44 U/L 20  18  15       Iron /TIBC/Ferritin/ %Sat    Component Value Date/Time   IRON  59 10/06/2024 0827   TIBC 279 10/06/2024 0827   FERRITIN 235 10/06/2024 0827   IRONPCTSAT 21 10/06/2024 0827      RADIOGRAPHIC STUDIES: I have personally reviewed the radiological images as listed and agreed with the findings in the report., CT CHEST ABDOMEN PELVIS W CONTRAST Result Date: 10/13/2024 CLINICAL DATA:  Follow-up metastatic endometrial carcinoma. * Tracking Code: BO * EXAM: CT CHEST, ABDOMEN, AND PELVIS WITH CONTRAST TECHNIQUE: Multidetector CT imaging of the chest, abdomen and pelvis was performed following the standard protocol during bolus administration of intravenous contrast. RADIATION DOSE REDUCTION: This exam was performed according to the departmental dose-optimization program which includes automated exposure control, adjustment of the mA and/or kV according to patient size and/or use of iterative reconstruction technique. CONTRAST:  OMNIPAQUE  IOHEXOL  300 MG/ML  SOLN COMPARISON:  06/09/2024 FINDINGS: CT CHEST FINDINGS Cardiovascular: No acute findings. Mediastinum/Lymph Nodes: No masses or pathologically enlarged lymph nodes identified.  Lungs/Pleura: No suspicious pulmonary nodules or masses identified. No evidence of infiltrate or pleural effusion. Musculoskeletal:  No suspicious bone lesions identified. CT ABDOMEN AND PELVIS FINDINGS Hepatobiliary: No masses identified. Prior cholecystectomy. No evidence of biliary obstruction. Pancreas:  No mass or inflammatory changes. Spleen:  Within normal limits in size and appearance. Adrenals/Urinary tract: No suspicious masses or hydronephrosis. 3 mm nonobstructing calculus again seen in lower pole of left kidney. Unremarkable unopacified urinary bladder. Stomach/Bowel: No evidence of obstruction, inflammatory process, or abnormal fluid collections. Left colonic diverticulosis is again seen, without signs of diverticulitis. Vascular/Lymphatic: Mild retroperitoneal lymphadenopathy is again seen in the left paraaortic region, with largest lymph node measuring 2.1 cm short axis. This shows no significant change compared to prior study. No new or increased areas of lymphadenopathy identified. No acute vascular findings. Reproductive: Prior hysterectomy noted. Vaginal cuff and adnexal regions are unremarkable in appearance. No masses or free fluid identified. Other:  None. Musculoskeletal:  No suspicious bone lesions identified. IMPRESSION: Stable mild abdominal retroperitoneal lymphadenopathy, consistent with metastatic disease. No new or progressive metastatic disease within the chest, abdomen, or pelvis. Colonic diverticulosis, without radiographic evidence of diverticulitis. Tiny nonobstructing left renal calculus. Electronically Signed   By: Norleen DELENA Kil M.D.   On: 10/13/2024 11:00   DG Chest Port 1 View Result Date: 10/09/2024 CLINICAL DATA:  fever, chemo pt EXAM: PORTABLE CHEST 1 VIEW COMPARISON:  11/18/2023. FINDINGS: Bilateral lung fields are clear. Bilateral costophrenic angles are clear. Normal cardio-mediastinal silhouette. No acute osseous abnormalities. The soft tissues are within normal  limits. IMPRESSION: No active disease. Electronically Signed   By: Ree Molt M.D.   On: 10/09/2024 15:31

## 2024-10-19 NOTE — Telephone Encounter (Signed)
 The patient called in and she says she just saw you this morning , he says it is reference to what y'all talked about this morning.  She said when you have time can you do a telephone call.

## 2024-10-20 ENCOUNTER — Telehealth: Payer: Self-pay

## 2024-10-20 ENCOUNTER — Other Ambulatory Visit: Payer: Self-pay

## 2024-10-20 ENCOUNTER — Encounter: Payer: Self-pay | Admitting: Oncology

## 2024-10-20 ENCOUNTER — Encounter: Payer: Self-pay | Admitting: Obstetrics and Gynecology

## 2024-10-20 NOTE — Telephone Encounter (Signed)
 Orders have been placed in IS. Please schedule and notify pt of appt details.

## 2024-10-20 NOTE — Telephone Encounter (Signed)
 [facility] requested on specimen [id number], collected 06/21/2024.

## 2024-10-26 ENCOUNTER — Other Ambulatory Visit: Payer: Self-pay | Admitting: Oncology

## 2024-10-26 ENCOUNTER — Telehealth: Payer: Self-pay | Admitting: *Deleted

## 2024-10-26 DIAGNOSIS — C541 Malignant neoplasm of endometrium: Secondary | ICD-10-CM

## 2024-10-26 NOTE — Telephone Encounter (Signed)
 Patient says [facility] and she has pneumonia and they gave her doxycycline  and she wonders if she needs to come in for her tomorrow or because of the pneumonia she stay at home until she gets better.  I spoke to Dr. Babara about this and she says definitely cancel for tomorrow, will set up another treatment plan and it could be 1 to 2 weeks.  The patient would like to be on 1 week so she does not miss too much but she understands if you need extra time.  Says that she will see it in MyChart

## 2024-10-27 ENCOUNTER — Inpatient Hospital Stay

## 2024-10-27 ENCOUNTER — Ambulatory Visit

## 2024-10-27 ENCOUNTER — Inpatient Hospital Stay: Admitting: Oncology

## 2024-10-27 ENCOUNTER — Other Ambulatory Visit

## 2024-10-27 ENCOUNTER — Ambulatory Visit: Admitting: Oncology

## 2024-10-28 ENCOUNTER — Encounter: Payer: Self-pay | Admitting: Oncology

## 2024-11-02 ENCOUNTER — Other Ambulatory Visit: Payer: Self-pay | Admitting: Internal Medicine

## 2024-11-02 DIAGNOSIS — Z1231 Encounter for screening mammogram for malignant neoplasm of breast: Secondary | ICD-10-CM

## 2024-11-04 ENCOUNTER — Inpatient Hospital Stay
Admission: RE | Admit: 2024-11-04 | Discharge: 2024-11-04 | Disposition: A | Discharge status: Home / Self Care | Payer: Self-pay | Source: Ambulatory Visit | Attending: Internal Medicine | Admitting: Internal Medicine

## 2024-11-04 ENCOUNTER — Other Ambulatory Visit: Payer: Self-pay | Admitting: *Deleted

## 2024-11-04 DIAGNOSIS — Z1231 Encounter for screening mammogram for malignant neoplasm of breast: Secondary | ICD-10-CM

## 2024-11-07 ENCOUNTER — Telehealth: Payer: Self-pay

## 2024-11-07 NOTE — Telephone Encounter (Signed)
 Patient calling to let Dr. Babara know that she is now taking Amoxicillin  for her pneumonia that was diagnosed on  10/25/24.  Informed patient of the appts on 11/16/24.

## 2024-11-09 ENCOUNTER — Inpatient Hospital Stay

## 2024-11-10 ENCOUNTER — Emergency Department
Admission: EM | Admit: 2024-11-10 | Discharge: 2024-11-10 | Disposition: A | Discharge status: Home / Self Care | Attending: Emergency Medicine | Admitting: Emergency Medicine

## 2024-11-10 ENCOUNTER — Emergency Department

## 2024-11-10 ENCOUNTER — Other Ambulatory Visit: Payer: Self-pay

## 2024-11-10 ENCOUNTER — Other Ambulatory Visit: Payer: Self-pay | Admitting: Oncology

## 2024-11-10 DIAGNOSIS — R091 Pleurisy: Secondary | ICD-10-CM | POA: Insufficient documentation

## 2024-11-10 DIAGNOSIS — I10 Essential (primary) hypertension: Secondary | ICD-10-CM | POA: Diagnosis not present

## 2024-11-10 DIAGNOSIS — E119 Type 2 diabetes mellitus without complications: Secondary | ICD-10-CM | POA: Diagnosis not present

## 2024-11-10 DIAGNOSIS — J45909 Unspecified asthma, uncomplicated: Secondary | ICD-10-CM | POA: Diagnosis not present

## 2024-11-10 DIAGNOSIS — R079 Chest pain, unspecified: Secondary | ICD-10-CM | POA: Insufficient documentation

## 2024-11-10 LAB — CBC
HCT: 38.7 % (ref 36.0–46.0)
Hemoglobin: 11.9 g/dL — ABNORMAL LOW (ref 12.0–15.0)
MCH: 27.6 pg (ref 26.0–34.0)
MCHC: 30.7 g/dL (ref 30.0–36.0)
MCV: 89.8 fL (ref 80.0–100.0)
Platelets: 333 K/uL (ref 150–400)
RBC: 4.31 MIL/uL (ref 3.87–5.11)
RDW: 15.6 % — ABNORMAL HIGH (ref 11.5–15.5)
WBC: 11.8 K/uL — ABNORMAL HIGH (ref 4.0–10.5)
nRBC: 0 % (ref 0.0–0.2)

## 2024-11-10 LAB — BASIC METABOLIC PANEL WITH GFR
Anion gap: 12 (ref 5–15)
BUN: 18 mg/dL (ref 8–23)
CO2: 26 mmol/L (ref 22–32)
Calcium: 10.1 mg/dL (ref 8.9–10.3)
Chloride: 102 mmol/L (ref 98–111)
Creatinine, Ser: 0.91 mg/dL (ref 0.44–1.00)
GFR, Estimated: >60 mL/min (ref >60)
Glucose, Bld: 110 mg/dL — ABNORMAL HIGH (ref 70–99)
Potassium: 4.2 mmol/L (ref 3.5–5.1)
Sodium: 140 mmol/L (ref 135–145)

## 2024-11-10 LAB — TROPONIN T, HIGH SENSITIVITY
Troponin T High Sensitivity: <15 ng/L (ref 0–19)
Troponin T High Sensitivity: <15 ng/L (ref 0–19)

## 2024-11-10 MED ORDER — HYDROCOD POLI-CHLORPHE POLI ER 10-8 MG/5ML PO SUER
5.0000 mL | Freq: Once | ORAL | Status: AC
  Administered 2024-11-10: 5 mL via ORAL
  Filled 2024-11-10: qty 5

## 2024-11-10 MED ORDER — HYDROCOD POLI-CHLORPHE POLI ER 10-8 MG/5ML PO SUER: 5.0000 mL | Freq: Two times a day (BID) | ORAL | 0 refills | Status: AC | PRN

## 2024-11-10 NOTE — Discharge Instructions (Signed)
 Please take your cough medication as needed but only as prescribed.  Do not drink alcohol or drive while taking cough medication.  Return to the emergency department for any further significant chest pain any trouble breathing or any other symptom personally concerning to yourself.  Otherwise please follow-up with your primary care doctor in the next several days for recheck.

## 2024-11-10 NOTE — ED Notes (Signed)
 Spoke to [place] in lab on the phone to request a blood draw from lab.

## 2024-11-10 NOTE — ED Provider Notes (Signed)
 [facility] Provider Note    Event Date/Time   First MD Initiated Contact with Patient 11/10/24 1836     (approximate)  History   Chief Complaint: Chest Pain  HPI  Sandra Fischer is a 73 y.o. female with a past medical history of asthma, diabetes, hypertension, hyperlipidemia, presents to the emergency department for chest pain.  According to the patient 3 weeks ago she was diagnosed with pneumonia placed on Zithromax  and then 1 week later was placed on Augmentin .  Patient states since starting the antibiotics the cough has diminished somewhat however it is still present.  States she is no longer having shortness of breath or any other pneumonia symptoms besides she experiences intermittent chest pain it was worse today she took nitroglycerin with some relief but the chest pain began coming back again she does relate the chest pain mostly the coughing.  She was concerned/came to the emergency department for evaluation.  Physical Exam   Triage Vital Signs: ED Triage Vitals  Encounter Vitals Group     BP 11/10/24 1507 (!) 140/66     Girls Systolic BP Percentile --      Girls Diastolic BP Percentile --      Boys Systolic BP Percentile --      Boys Diastolic BP Percentile --      Pulse Rate 11/10/24 1507 71     Resp 11/10/24 1507 18     Temp 11/10/24 1507 98 F (36.7 C)     Temp Source 11/10/24 1507 Oral     SpO2 11/10/24 1507 93 %     Weight --      Height --      Head Circumference --      Peak Flow --      Pain Score 11/10/24 1505 7     Pain Loc --      Pain Education --      Exclude from Growth Chart --     Most recent vital signs: Vitals:   11/10/24 1835 11/10/24 1838  BP: 122/71   Pulse: (!) 58   Resp:    Temp:    SpO2: 97% 98%    General: Awake, no distress.  CV:  Good peripheral perfusion.  Regular rate and rhythm  Resp:  Normal effort.  Equal breath sounds bilaterally.  Abd:  No distention.  Soft, nontender.  No rebound or  guarding.  ED Results / Procedures / Treatments   EKG  EKG viewed and interpreted by myself shows a normal sinus rhythm at 71 bpm with a narrow QRS, normal axis, normal intervals, no concerning ST changes.  No ST elevation.  RADIOLOGY  I have reviewed interpret the chest x-ray images.  No consolidation on my evaluation. Radiology is read the x-ray is negative.   MEDICATIONS ORDERED IN ED: Medications  chlorpheniramine-HYDROcodone  (TUSSIONEX) 10-8 MG/5ML suspension 5 mL (has no administration in time range)     IMPRESSION / MDM / ASSESSMENT AND PLAN / ED COURSE  I reviewed the triage vital signs and the nursing notes.  Patient's presentation is most consistent with acute presentation with potential threat to life or bodily function.  Patient presents to the emergency department for chest discomfort.  Patient has been coughing over the last 3 weeks has finished her course of antibiotics.  Patient's workup today in the emergency department is overall reassuring vital signs are reassuring.  Patient's physical exam is reassuring.  Clear lung sounds bilaterally.  Patient's lab work shows  a reassuring CBC, reassuring chemistry, negative troponin x 2.  Patient's chest x-ray is clear and EKG shows no concerning findings.  Given the patient's cough I suspect that the patient could be experiencing more of a pleurisy discomfort.  Given the reassuring workup with negative troponin x 2 I believe the patient will be safe for discharge home with outpatient follow-up.  Will prescribe a codeine-based cough medication, patient states she has taken pain medication and codeine previously without any issue contrary to her listed allergies.  FINAL CLINICAL IMPRESSION(S) / ED DIAGNOSES   Pleurisy Chest pain   Note:  This document was prepared using Dragon voice recognition software and may include unintentional dictation errors.   Dorothyann Drivers, MD 11/10/24 1850

## 2024-11-10 NOTE — ED Triage Notes (Signed)
 Patient states left sided chest pain that started this morning around 0700; took 2 NTG which helped with pain but then pain started to come back. Currently taking antibiotics for Pneumonia.

## 2024-11-16 ENCOUNTER — Encounter: Payer: Self-pay | Admitting: Oncology

## 2024-11-16 ENCOUNTER — Inpatient Hospital Stay

## 2024-11-16 ENCOUNTER — Inpatient Hospital Stay: Admitting: Oncology

## 2024-11-16 ENCOUNTER — Inpatient Hospital Stay: Attending: Oncology

## 2024-11-16 VITALS — BP 139/67 | HR 55 | Temp 97.3°F | Resp 18 | Wt 205.7 lb

## 2024-11-16 VITALS — BP 139/67 | HR 51 | Temp 97.3°F | Resp 19

## 2024-11-16 DIAGNOSIS — C541 Malignant neoplasm of endometrium: Secondary | ICD-10-CM | POA: Insufficient documentation

## 2024-11-16 DIAGNOSIS — R911 Solitary pulmonary nodule: Secondary | ICD-10-CM | POA: Insufficient documentation

## 2024-11-16 DIAGNOSIS — M069 Rheumatoid arthritis, unspecified: Secondary | ICD-10-CM | POA: Insufficient documentation

## 2024-11-16 DIAGNOSIS — Z5111 Encounter for antineoplastic chemotherapy: Secondary | ICD-10-CM | POA: Insufficient documentation

## 2024-11-16 DIAGNOSIS — C786 Secondary malignant neoplasm of retroperitoneum and peritoneum: Secondary | ICD-10-CM | POA: Diagnosis not present

## 2024-11-16 DIAGNOSIS — E876 Hypokalemia: Secondary | ICD-10-CM | POA: Diagnosis not present

## 2024-11-16 DIAGNOSIS — G620 Drug-induced polyneuropathy: Secondary | ICD-10-CM

## 2024-11-16 LAB — CBC WITH DIFFERENTIAL (CANCER CENTER ONLY)
Abs Immature Granulocytes: 0.07 K/uL (ref 0.00–0.07)
Basophils Absolute: 0.1 K/uL (ref 0.0–0.1)
Basophils Relative: 1 %
Eosinophils Absolute: 0.2 K/uL (ref 0.0–0.5)
Eosinophils Relative: 2 %
HCT: 38.4 % (ref 36.0–46.0)
Hemoglobin: 11.9 g/dL — ABNORMAL LOW (ref 12.0–15.0)
Immature Granulocytes: 1 %
Lymphocytes Relative: 33 %
Lymphs Abs: 3.1 K/uL (ref 0.7–4.0)
MCH: 27.5 pg (ref 26.0–34.0)
MCHC: 31 g/dL (ref 30.0–36.0)
MCV: 88.9 fL (ref 80.0–100.0)
Monocytes Absolute: 0.8 K/uL (ref 0.1–1.0)
Monocytes Relative: 9 %
Neutro Abs: 5.2 K/uL (ref 1.7–7.7)
Neutrophils Relative %: 54 %
Platelet Count: 307 K/uL (ref 150–400)
RBC: 4.32 MIL/uL (ref 3.87–5.11)
RDW: 15.7 % — ABNORMAL HIGH (ref 11.5–15.5)
WBC Count: 9.4 K/uL (ref 4.0–10.5)
nRBC: 0 % (ref 0.0–0.2)

## 2024-11-16 LAB — CMP (CANCER CENTER ONLY)
ALT: 11 U/L (ref 0–44)
AST: 14 U/L — ABNORMAL LOW (ref 15–41)
Albumin: 3.7 g/dL (ref 3.5–5.0)
Alkaline Phosphatase: 60 U/L (ref 38–126)
Anion gap: 11 (ref 5–15)
BUN: 22 mg/dL (ref 8–23)
CO2: 22 mmol/L (ref 22–32)
Calcium: 9.1 mg/dL (ref 8.9–10.3)
Chloride: 102 mmol/L (ref 98–111)
Creatinine: 0.93 mg/dL (ref 0.44–1.00)
GFR, Estimated: >60 mL/min (ref >60)
Glucose, Bld: 125 mg/dL — ABNORMAL HIGH (ref 70–99)
Potassium: 3.7 mmol/L (ref 3.5–5.1)
Sodium: 135 mmol/L (ref 135–145)
Total Bilirubin: 0.6 mg/dL (ref 0.0–1.2)
Total Protein: 7.7 g/dL (ref 6.5–8.1)

## 2024-11-16 MED ORDER — SODIUM CHLORIDE 0.9 % IV SOLN
100.0000 mg/m2 | Freq: Once | INTRAVENOUS | Status: AC
  Administered 2024-11-16: 198 mg via INTRAVENOUS
  Filled 2024-11-16: qty 33

## 2024-11-16 MED ORDER — PROCHLORPERAZINE EDISYLATE 10 MG/2ML IJ SOLN
10.0000 mg | Freq: Once | INTRAMUSCULAR | Status: AC
  Administered 2024-11-16: 10 mg via INTRAVENOUS
  Filled 2024-11-16: qty 2

## 2024-11-16 MED ORDER — DEXAMETHASONE SOD PHOSPHATE PF 10 MG/ML IJ SOLN
10.0000 mg | Freq: Once | INTRAMUSCULAR | Status: AC
  Administered 2024-11-16: 10 mg via INTRAVENOUS

## 2024-11-16 MED ORDER — SODIUM CHLORIDE 0.9 % IV SOLN
405.6000 mg | Freq: Once | INTRAVENOUS | Status: AC
  Administered 2024-11-16: 410 mg via INTRAVENOUS
  Filled 2024-11-16: qty 41

## 2024-11-16 MED ORDER — PEGFILGRASTIM INF DEV 6 MG/0.6ML ~~LOC~~ SOSY: 6.0000 mg | PREFILLED_SYRINGE | Freq: Once | SUBCUTANEOUS | Status: DC

## 2024-11-16 MED ORDER — DIPHENHYDRAMINE HCL 50 MG/ML IJ SOLN
50.0000 mg | Freq: Once | INTRAMUSCULAR | Status: AC
  Administered 2024-11-16: 50 mg via INTRAVENOUS
  Filled 2024-11-16: qty 1

## 2024-11-16 MED ORDER — SODIUM CHLORIDE 0.9 % IV SOLN
INTRAVENOUS | Status: DC
  Filled 2024-11-16: qty 250

## 2024-11-16 MED ORDER — FAMOTIDINE IN NACL 20-0.9 MG/50ML-% IV SOLN
20.0000 mg | Freq: Once | INTRAVENOUS | Status: AC
  Administered 2024-11-16: 20 mg via INTRAVENOUS
  Filled 2024-11-16: qty 50

## 2024-11-16 MED ORDER — PALONOSETRON HCL INJECTION 0.25 MG/5ML
0.2500 mg | Freq: Once | INTRAVENOUS | Status: AC
  Administered 2024-11-16: 0.25 mg via INTRAVENOUS
  Filled 2024-11-16: qty 5

## 2024-11-16 NOTE — Assessment & Plan Note (Signed)
 K is stable. Recommend potassium 10meq daily

## 2024-11-16 NOTE — Assessment & Plan Note (Signed)
#  Neuropathy due to chemotherapy.   Overall improved. She has self discontinued gabapentin , manageable symptoms.

## 2024-11-16 NOTE — Assessment & Plan Note (Signed)
 Off RA med while on chemotherapy.   follow up with rheumatology

## 2024-11-16 NOTE — Progress Notes (Signed)
 Hematology/Oncology Progress note [phone] [phone]    CHIEF COMPLAINTS/REASON FOR VISIT:  Follow up for serous endometrial cancer  ASSESSMENT & PLAN:   Recurrent carcinoma of endometrium (HCC) Recurrent endometrial cancer, TP 53 + Status post 6 cycles of chemotherapy with carboplatin  and Taxol . Patient with developed recurrent disease, retroperitoneal mass-> biopsy proven recurrence. pMMR status, HER2 1+  Labs are reviewed and discussed with patient.  S/p 3 cycles  carboplatin  AUC 4.5 Taxol  135mg /m2,with GCSF Repeat CT scan showed stable disease, slight decrease of retroperitoneal mass 2.5cm --> 2.1cm Patient has had moderate difficulties while on chemotherapy. We have discussed about options of chemotherapy break, monitor disease vs further dose reduction treatment and increase of interval between treatments. Patient elects to continue treatments.   Labs are reviewed and discussed with patient. Proceed with dose reduced carboplatin  AUC 4 and Taxol  100 mg/m2.  Hold off G-CSF due to severe bone pain. Plan to increase interval to Q4 weeks.     Hypokalemia K is stable. Recommend potassium 10meq daily  Lung nodule Stable on recent CT scans.  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   Overall improved. She has self discontinued gabapentin , manageable symptoms.   Rheumatoid arthritis (HCC) Off RA med while on chemotherapy.   follow up with rheumatology     Orders Placed This Encounter  Procedures   CA 125    Standing Status:   Future    Expected Date:   01/05/2025    Expiration Date:   01/05/2026   CBC with Differential (Cancer Center Only)    Standing Status:   Future    Expected Date:   01/05/2025    Expiration Date:   01/05/2026   CMP (Cancer Center only)    Standing Status:   Future    Expected Date:   01/05/2025    Expiration Date:   01/05/2026   Follow-up 4 weeks All questions were answered. The patient knows  to call the clinic with any problems, questions or concerns.  [name], MD, PhD [facility] Hematology Oncology 11/16/2024   HISTORY OF PRESENTING ILLNESS:   Sandra Fischer is a  73 y.o.  female presents for high-grade serous endometrial cancer Oncology History  Recurrent carcinoma of endometrium (HCC)  07/15/2019 Initial Diagnosis   Endometrial cancer  Patient had TH/BSO sentinel lymph node biopsy by [facility] oncology Dr. Augustin on 06/21/2019. Extensive medical records from [facility] review was performed by me.  Tumor size 9.3 cm, invading 99% of myometrium [3.55 out of 3.6 cm], positive right external iliac sentinel lymph nodes.  Negative washing.  No LVI, serosa is uninvolved. 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 [id number]  FIGO stage IIIC1 MSI intact HER 2 negative.   her case was discussed at [facility] tumor board on 07/04/2019  PORTEC-3 regimen was recommended given subgroup analysis showing benefit in the serous histology  Patient declined treatment.     10/20/2019 Relapse/Recurrence   CT chest abdomen pelvis with contrast showed disease recurrence. There are multiple newly enlarged retroperitoneal and right iliac lymph nodes the largest the left retroperitoneal node measuring 1.9 x 1.6 cm concerning for nodal metastasis disease. Unchanged 4 mm groundglass pulmonary nodule of the left pulmonary apex. This remains nonspecific.    11/07/2019 - 02/20/2020 Chemotherapy   carboplatin  AUC 4.5 and paclitaxel  175 mg/m2 x 2 cycles carboplatin  AUC4.5 and paclitaxel  135 mg/m2 x 4 cycles   03/12/2020 Imaging   CT chest abdomen pelvis with  contrast showed 1. The multiple enlarged retroperitoneal and right iliac lymph nodes identified as new on the previous exam from 10/20/2019 have resolved completely in the interval. 2. Tiny ground-glass pulmonary nodule medial left lung apex is stable in the interval. Likely benign, continued  attention on follow-up recommended. 3. No new or progressive interval findings. 4.  Aortic Atherosclerois (ICD10-170.0   06/07/2024 Imaging   CT chest abdomen pelvis with contrast showed 1. Newly enlarged left retroperitoneal lymph nodes measuring up to 2.5 x 2.2 cm, consistent with nodal metastatic disease. 2. Occasional tiny pulmonary nodules unchanged, most likely benign and incidental. 3. Status post hysterectomy. 4. Nonobstructive left nephrolithiasis.   06/21/2024 Relapse/Recurrence   Periaortic left side soft tissue mass biopsy showed metastatic poorly differentiated carcinoma.    Diagnosis Note : The specimen demonstrates a pleomorphic high-grade epithelioid proliferation. Immunohistochemical stains were performed to characterize the tumor cells. The cells are positive for CK AE1/AE3, GATA3, CDX2, and p40. P53 is mutant type of staining. The cells are negative for PAX8, TTF-1, WT1, ER, -PR, S100 and SOX10. The immunoprofile is not specific and is not indicative of a specific site of primary. The patient's history of endometrial carcinoma is notes. The tumor most likely represents a metastasis from the patient's known endometrial carcinoma since metastatic tumor cells could aberrant loss or gain expression of lineage markers. However, metastasis from other organ system cannot be entirely excluded. Correlation with imaging findings is recommended. Controls worked appropriately.    07/08/2024 -  Chemotherapy   Patient is on Treatment Plan : UTERINE Carboplatin  + Paclitaxel  q21d       INTERVAL HISTORY Sandra Fischer is a 73 y.o. female who has above history reviewed by me today presents for follow up visit for management of endometrial cancer.  Discussed the use of AI scribe software for clinical note transcription with the patient, who gave verbal consent to proceed.   She experiences significant pain after injections, leaving her bedridden for two days despite using Claritin , which  provides minimal relief. She is concerned about the impact of chemotherapy on her immune system, leading to frequent infections since July 2025, including bronchitis, mild COVID-19, and pharyngitis. She also has rheumatoid arthritis and is currently off Enbrel due to the immunosuppressive effects of chemotherapy. Previously, chemotherapy helped manage her rheumatoid symptoms, but now she feels her tolerance to chemotherapy is poor. She is concerned that the chemotherapy drugs might vary between different batches.   I have discussed with her about the option of holding treatments. She initially planned to take a break from chemotherapy but decided to resume treatment. She is considering seeking a second opinion regarding her treatment options but has not yet pursued this due to financial considerations.  Cough has resolved. No fever or chills.      Review of Systems  Constitutional:  Positive for fatigue. Negative for appetite change, chills and fever.  HENT:   Negative for hearing loss and voice change.   Eyes:  Negative for eye problems.  Respiratory:  Negative for chest tightness and cough.   Cardiovascular:  Negative for chest pain.  Gastrointestinal:  Negative for abdominal distention, abdominal pain, blood in stool and diarrhea.  Endocrine: Negative for hot flashes.  Genitourinary:  Positive for vaginal discharge. Negative for difficulty urinating and frequency.   Musculoskeletal:  Negative for arthralgias.  Skin:  Negative for itching and rash.  Neurological:  Positive for numbness. Negative for extremity weakness.  Hematological:  Negative for adenopathy.  Psychiatric/Behavioral:  Negative for  confusion.     MEDICAL HISTORY:  Past Medical History:  Diagnosis Date   Asthma    Asthma    Asthma exacerbation 08/22/2016   Cancer (HCC)    Endometrial   Collagen vascular disease    Diabetes mellitus without complication (HCC)    History of Diabetes    Dyspnea    Environmental  allergies    Fibromyalgia    High cholesterol    Hypertension    Hypothyroidism    Iron  deficiency anemia 07/03/2020   Neuropathy    RA (rheumatoid arthritis) (HCC)    Sleep apnea    Thyroid  disease     SURGICAL HISTORY: Past Surgical History:  Procedure Laterality Date   ABDOMINAL HYSTERECTOMY     CATARACT EXTRACTION     CHOLECYSTECTOMY     COLONOSCOPY WITH PROPOFOL  N/A 03/14/2022   Procedure: COLONOSCOPY WITH PROPOFOL ;  Surgeon: Maryruth Ole DASEN, MD;  Location: [facility] ENDOSCOPY;  Service: Endoscopy;  Laterality: N/A;  DM   CYSTOURETHROSCOPY     DIAGNOSTIC LAPAROSCOPY     EYE SURGERY     fibroids removed     PORTA CATH REMOVAL N/A 02/14/2021   Procedure: PORTA CATH REMOVAL;  Surgeon: Marea Selinda RAMAN, MD;  Location: [facility] INVASIVE CV LAB;  Service: Cardiovascular;  Laterality: N/A;   THYROIDECTOMY, PARTIAL     tummy tuck      SOCIAL HISTORY: Social History   Socioeconomic History   Marital status: Married    Spouse name: Cedric    Number of children: Not on file   Years of education: Not on file   Highest education level: Not on file  Occupational History   Occupation: Retired  Tobacco Use   Smoking status: Former    Current packs/day: 0.00    Average packs/day: 0.1 packs/day for 0.2 years    Types: Cigarettes    Start date: 09/1978    Quit date: 1980    Years since quitting: 45.9   Smokeless tobacco: Never  Vaping Use   Vaping status: Never Used  Substance and Sexual Activity   Alcohol use: No   Drug use: No   Sexual activity: Yes    Birth control/protection: Post-menopausal  Other Topics Concern   Not on file  Social History Narrative   Not on file   Social Drivers of Health   Financial Resource Strain: Not on file  Food Insecurity: Not on file  Transportation Needs: Not on file  Physical Activity: Not on file  Stress: Not on file  Social Connections: Not on file  Intimate Partner Violence: Not on file    FAMILY HISTORY: Family History  Problem  Relation Age of Onset   Diabetes Mother    Hypertension Mother    Hyperlipidemia Mother    Dementia Mother    Breast cancer Neg Hx    Ovarian cancer Neg Hx    Colon cancer Neg Hx     ALLERGIES:  is allergic to abatacept, codeine, iodine, latex, shellfish allergy, trimethoprim  hydrochloride [trimethoprim ], metformin, and penicillins.  MEDICATIONS:  Current Outpatient Medications  Medication Sig Dispense Refill   Acetaminophen  (TYLENOL  EXTRA STRENGTH PO) Take 1,300 mg by mouth 2 (two) times daily as needed.     ascorbic acid (VITAMIN C) 500 MG tablet Take 500 mg by mouth daily.     aspirin  EC 81 MG tablet Take 81 mg by mouth daily.     azelastine  (ASTELIN ) 0.1 % nasal spray Place 2 sprays into both nostrils 2 (two) times  daily. 30 mL 5   Calcium Carbonate-Vit D-Min (CALCIUM 600+D PLUS MINERALS) 600-400 MG-UNIT TABS Take 1 tablet by mouth 2 (two) times daily.     cetirizine  (ZYRTEC ) 10 MG tablet Take 1 tablet (10 mg total) by mouth daily. 30 tablet 0   chlorpheniramine-HYDROcodone  (TUSSIONEX) 10-8 MG/5ML Take 5 mLs by mouth every 12 (twelve) hours as needed for cough. 120 mL 0   Cholecalciferol (D3 ADULT PO) Take 1 capsule by mouth daily.     clotrimazole-betamethasone (LOTRISONE) cream Apply topically 4 (four) times daily as needed.     desloratadine (CLARINEX) 5 MG tablet Take 5 mg by mouth daily.     dexamethasone  (DECADRON ) 4 MG tablet Take 2 tablets (8 mg total) by mouth daily for 3 days. Start the day after chemotherapy. Take with food. 30 tablet 1   empagliflozin (JARDIANCE) 25 MG TABS tablet Take 12.5 mg by mouth daily.     fluticasone  (FLONASE ) 50 MCG/ACT nasal spray Place 1 spray into both nostrils 2 (two) times daily. 16 g 0   fluticasone -salmeterol (ADVAIR) 250-50 MCG/ACT AEPB Inhale 1 puff into the lungs in the morning and at bedtime.     furosemide (LASIX) 10 MG/ML solution Take by mouth daily.     gabapentin  (NEURONTIN ) 300 MG capsule Take 1 capsule (300 mg total) by mouth 2  (two) times daily. 60 capsule 0   hydrALAZINE (APRESOLINE) 25 MG tablet Take 25 mg by mouth 2 (two) times daily.     ipratropium (ATROVENT ) 0.02 % nebulizer solution Take 0.5 mg by nebulization every 4 (four) hours as needed.      levothyroxine (SYNTHROID) 75 MCG tablet Take 75 mcg by mouth daily before breakfast.     lisinopril (PRINIVIL,ZESTRIL) 40 MG tablet Take 20 mg by mouth in the morning and at bedtime.      metoprolol succinate (TOPROL-XL) 50 MG 24 hr tablet Take 50 mg by mouth daily. Take with or immediately following a meal.     montelukast  (SINGULAIR ) 10 MG tablet Take 10 mg by mouth at bedtime.     nortriptyline (PAMELOR) 25 MG capsule Take 25 mg by mouth at bedtime.     nystatin  cream (MYCOSTATIN ) Apply 1 Application topically 2 (two) times daily. 30 g 3   olopatadine  (PATANOL) 0.1 % ophthalmic solution Place 1 drop into both eyes 2 (two) times daily. 5 mL 5   omeprazole (PRILOSEC) 10 MG capsule Take 20 mg by mouth daily.     ondansetron  (ZOFRAN ) 8 MG tablet Take 1 tablet (8 mg total) by mouth every 8 (eight) hours as needed for nausea or vomiting. Start on the third day after chemotherapy. 30 tablet 1   ondansetron  (ZOFRAN -ODT) 4 MG disintegrating tablet Take 1 tablet (4 mg total) by mouth every 8 (eight) hours as needed for nausea or vomiting. 15 tablet 0   pravastatin (PRAVACHOL) 40 MG tablet Take 40 mg by mouth daily.     predniSONE  (DELTASONE ) 5 MG tablet Take 5 mg by mouth daily with breakfast.     Prenatal Vit-Fe Fumarate-FA (MULTIVITAMIN-PRENATAL) 27-0.8 MG TABS tablet Take 1 tablet by mouth daily.      prochlorperazine  (COMPAZINE ) 10 MG tablet Take 1 tablet (10 mg total) by mouth every 6 (six) hours as needed for nausea or vomiting. 30 tablet 1   senna-docusate (SENOKOT-S) 8.6-50 MG tablet Take 1 tablet by mouth at bedtime as needed for mild constipation.     sulfaSALAzine (AZULFIDINE) 500 MG tablet TAKE ONE TABLET BY MOUTH TWO  TIMES A DAY FOR 14 DAYS, THEN TAKE TWO TABLETS  TWO TIMES A DAY FOR 14 DAYS, THEN TAKE THREE TABLETS TWO TIMES A DAY FOR 62 DAYS FOR INFLAMMATION. TAKE AFTER MEALS     tacrolimus (PROTOPIC) 0.1 % ointment Apply 1 Application topically 2 (two) times daily.     tiotropium (SPIRIVA) 18 MCG inhalation capsule Place 18 mcg into inhaler and inhale daily.     verapamil (CALAN) 120 MG tablet Take 240 mg by mouth 2 (two) times daily.     EPINEPHrine  0.3 mg/0.3 mL IJ SOAJ injection Use as directed for severe allergic reaction. (Patient not taking: Reported on 11/16/2024) 2 Device 1   etanercept (ENBREL) 25 MG injection Inject 25 mg into the skin. (Patient not taking: Reported on 10/19/2024)     fluconazole  (DIFLUCAN ) 150 MG tablet Take 150 mg (1 tablet) by mouth every 3 days for 3 doses then 150 mg (1 tablet) once weekly for 7 weeks (Patient not taking: Reported on 11/16/2024) 10 tablet 0   leflunomide (ARAVA) 20 MG tablet Take 20 mg by mouth daily. (Patient not taking: Reported on 11/16/2024)     leflunomide (ARAVA) 20 MG tablet Take 1 tablet by mouth daily. (Patient not taking: Reported on 11/16/2024)     nitroGLYCERIN (NITROSTAT) 0.4 MG SL tablet Place under the tongue. Place 1 tablet (0.4 mg total) under the tongue every 5 (five) minutes as needed for Chest pain May take up to 3 doses. (Patient not taking: Reported on 11/16/2024)     potassium chloride  (KLOR-CON  M) 10 MEQ tablet Take 1 tablet (10 mEq total) by mouth daily. (Patient not taking: Reported on 11/16/2024) 30 tablet 0   predniSONE  (DELTASONE ) 50 MG tablet Take 1 tablet (50 mg total) by mouth See admin instructions. Prednisone  - take 50 mg by mouth at 13 hours, 7 hours, and 1 hour before contrast media injection (Patient not taking: Reported on 11/16/2024) 3 tablet 0   predniSONE  (DELTASONE ) 50 MG tablet Take 1 tablet (50 mg total) by mouth See admin instructions. Prednisone  - take 50 mg by mouth at 13 hours, 7 hours, and 1 hour before contrast media injection, Take Benadryl  50mg  30 minutes prior  to contrast (Patient not taking: Reported on 11/16/2024) 3 tablet 0   No current facility-administered medications for this visit.   Facility-Administered Medications Ordered in Other Visits  Medication Dose Route Frequency Provider Last Rate Last Admin   0.9 %  sodium chloride  infusion   Intravenous Continuous Babara Call, MD   Stopped at 11/16/24 1537     PHYSICAL EXAMINATION: ECOG PERFORMANCE STATUS: 1 - Symptomatic but completely ambulatory  Physical Exam Constitutional:      General: She is not in acute distress.    Appearance: She is obese.  HENT:     Head: Normocephalic and atraumatic.  Eyes:     General: No scleral icterus. Cardiovascular:     Rate and Rhythm: Normal rate and regular rhythm.     Heart sounds: Normal heart sounds.  Pulmonary:     Effort: Pulmonary effort is normal. No respiratory distress.     Breath sounds: Normal breath sounds. No wheezing.  Abdominal:     General: Bowel sounds are normal. There is no distension.     Palpations: Abdomen is soft.  Musculoskeletal:        General: Deformity present. Normal range of motion.     Cervical back: Normal range of motion and neck supple.  Skin:    General: Skin  is warm and dry.     Findings: No erythema or rash.  Neurological:     Mental Status: She is alert and oriented to person, place, and time. Mental status is at baseline.  Psychiatric:     Comments: Feel frustrated.      LABORATORY DATA:  I have reviewed the data as listed    Latest Ref Rng & Units 11/16/2024    8:46 AM 11/10/2024    3:36 PM 10/09/2024    2:24 PM  CBC  WBC 4.0 - 10.5 K/uL 9.4  11.8  18.2   Hemoglobin 12.0 - 15.0 g/dL 88.0  88.0  87.9   Hematocrit 36.0 - 46.0 % 38.4  38.7  39.2   Platelets 150 - 400 K/uL 307  333  168       Latest Ref Rng & Units 11/16/2024    8:46 AM 11/10/2024    3:36 PM 10/09/2024    2:24 PM  CMP  Glucose 70 - 99 mg/dL 874  889  861   BUN 8 - 23 mg/dL 22  18  25    Creatinine 0.44 - 1.00 mg/dL 9.06   9.08  9.01   Sodium 135 - 145 mmol/L 135  140  137   Potassium 3.5 - 5.1 mmol/L 3.7  4.2  3.2   Chloride 98 - 111 mmol/L 102  102  99   CO2 22 - 32 mmol/L 22  26  26    Calcium 8.9 - 10.3 mg/dL 9.1  89.8  9.2   Total Protein 6.5 - 8.1 g/dL 7.7   7.2   Total Bilirubin 0.0 - 1.2 mg/dL 0.6   1.0   Alkaline Phos 38 - 126 U/L 60   78   AST 15 - 41 U/L 14   24   ALT 0 - 44 U/L 11   20      Iron /TIBC/Ferritin/ %Sat    Component Value Date/Time   IRON  59 10/06/2024 0827   TIBC 279 10/06/2024 0827   FERRITIN 235 10/06/2024 0827   IRONPCTSAT 21 10/06/2024 0827      RADIOGRAPHIC STUDIES: I have personally reviewed the radiological images as listed and agreed with the findings in the report., DG Chest 2 View Result Date: 11/10/2024 CLINICAL DATA:  Chest pain EXAM: CHEST - 2 VIEW COMPARISON:  Chest x-ray 10/09/2024 FINDINGS: The heart size and mediastinal contours are within normal limits. Both lungs are clear. The visualized skeletal structures are unremarkable. IMPRESSION: No active cardiopulmonary disease. Electronically Signed   By: Greig Pique M.D.   On: 11/10/2024 15:47   MM Outside Films Mammo Result Date: 11/04/2024 This examination belongs to an outside facility and is stored here for comparison purposes only.  Contact the originating outside institution for any associated report or interpretation.  MM Outside Films Mammo Result Date: 11/04/2024 This examination belongs to an outside facility and is stored here for comparison purposes only.  Contact the originating outside institution for any associated report or interpretation.  MM Outside Films Mammo Result Date: 11/04/2024 This examination belongs to an outside facility and is stored here for comparison purposes only.  Contact the originating outside institution for any associated report or interpretation.  MM Outside Films Mammo Result Date: 11/04/2024 This examination belongs to an outside facility and is stored here for  comparison purposes only.  Contact the originating outside institution for any associated report or interpretation.  CT CHEST ABDOMEN PELVIS W CONTRAST Result Date: 10/13/2024 CLINICAL DATA:  Follow-up metastatic  endometrial carcinoma. * Tracking Code: BO * EXAM: CT CHEST, ABDOMEN, AND PELVIS WITH CONTRAST TECHNIQUE: Multidetector CT imaging of the chest, abdomen and pelvis was performed following the standard protocol during bolus administration of intravenous contrast. RADIATION DOSE REDUCTION: This exam was performed according to the departmental dose-optimization program which includes automated exposure control, adjustment of the mA and/or kV according to patient size and/or use of iterative reconstruction technique. CONTRAST:  OMNIPAQUE  IOHEXOL  300 MG/ML  SOLN COMPARISON:  06/09/2024 FINDINGS: CT CHEST FINDINGS Cardiovascular: No acute findings. Mediastinum/Lymph Nodes: No masses or pathologically enlarged lymph nodes identified. Lungs/Pleura: No suspicious pulmonary nodules or masses identified. No evidence of infiltrate or pleural effusion. Musculoskeletal:  No suspicious bone lesions identified. CT ABDOMEN AND PELVIS FINDINGS Hepatobiliary: No masses identified. Prior cholecystectomy. No evidence of biliary obstruction. Pancreas:  No mass or inflammatory changes. Spleen:  Within normal limits in size and appearance. Adrenals/Urinary tract: No suspicious masses or hydronephrosis. 3 mm nonobstructing calculus again seen in lower pole of left kidney. Unremarkable unopacified urinary bladder. Stomach/Bowel: No evidence of obstruction, inflammatory process, or abnormal fluid collections. Left colonic diverticulosis is again seen, without signs of diverticulitis. Vascular/Lymphatic: Mild retroperitoneal lymphadenopathy is again seen in the left paraaortic region, with largest lymph node measuring 2.1 cm short axis. This shows no significant change compared to prior study. No new or increased areas of  lymphadenopathy identified. No acute vascular findings. Reproductive: Prior hysterectomy noted. Vaginal cuff and adnexal regions are unremarkable in appearance. No masses or free fluid identified. Other:  None. Musculoskeletal:  No suspicious bone lesions identified. IMPRESSION: Stable mild abdominal retroperitoneal lymphadenopathy, consistent with metastatic disease. No new or progressive metastatic disease within the chest, abdomen, or pelvis. Colonic diverticulosis, without radiographic evidence of diverticulitis. Tiny nonobstructing left renal calculus. Electronically Signed   By: Norleen DELENA Kil M.D.   On: 10/13/2024 11:00   DG Chest Port 1 View Result Date: 10/09/2024 CLINICAL DATA:  fever, chemo pt EXAM: PORTABLE CHEST 1 VIEW COMPARISON:  11/18/2023. FINDINGS: Bilateral lung fields are clear. Bilateral costophrenic angles are clear. Normal cardio-mediastinal silhouette. No acute osseous abnormalities. The soft tissues are within normal limits. IMPRESSION: No active disease. Electronically Signed   By: Ree Molt M.D.   On: 10/09/2024 15:31

## 2024-11-16 NOTE — Assessment & Plan Note (Addendum)
 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 Patient with developed recurrent disease, retroperitoneal mass-> biopsy proven recurrence. pMMR status, HER2 1+  Labs are reviewed and discussed with patient.  S/p 3 cycles  carboplatin  AUC 4.5 Taxol  135mg /m2,with GCSF Repeat CT scan showed stable disease, slight decrease of retroperitoneal mass 2.5cm --> 2.1cm Patient has had moderate difficulties while on chemotherapy. We have discussed about options of chemotherapy break, monitor disease vs further dose reduction treatment and increase of interval between treatments. Patient elects to continue treatments.   Labs are reviewed and discussed with patient. Proceed with dose reduced carboplatin  AUC 4 and Taxol  100 mg/m2.  Hold off G-CSF due to severe bone pain. Plan to increase interval to Q4 weeks.

## 2024-11-16 NOTE — Assessment & Plan Note (Signed)
 Stable on recent CT scans.

## 2024-11-17 ENCOUNTER — Ambulatory Visit

## 2024-11-17 ENCOUNTER — Other Ambulatory Visit

## 2024-11-17 ENCOUNTER — Ambulatory Visit: Admitting: Oncology

## 2024-11-17 LAB — CA 125: Cancer Antigen (CA) 125: 12.6 U/mL (ref 0.0–38.1)

## 2024-11-18 ENCOUNTER — Telehealth: Payer: Self-pay

## 2024-11-18 NOTE — Telephone Encounter (Signed)
 05/2024 block has been exhausted and NGS testing cannot be performed. Please let me know if you would like her 05/2019 surgical specimen sent.

## 2024-11-21 ENCOUNTER — Telehealth: Payer: Self-pay

## 2024-11-21 NOTE — Telephone Encounter (Signed)
 Pt declines smc visit

## 2024-11-21 NOTE — Telephone Encounter (Signed)
 Please advise  Patient called reporting all over joint pain, 8/10 pain scale.  Is taking Gabapentin  and Tylenol  with no relief.  This is similar to the pain when she received GCSF.

## 2024-11-29 ENCOUNTER — Telehealth: Payer: Self-pay

## 2024-11-29 ENCOUNTER — Encounter: Payer: Self-pay | Admitting: Podiatry

## 2024-11-29 ENCOUNTER — Ambulatory Visit: Admitting: Podiatry

## 2024-11-29 DIAGNOSIS — M79675 Pain in left toe(s): Secondary | ICD-10-CM | POA: Diagnosis not present

## 2024-11-29 DIAGNOSIS — E1142 Type 2 diabetes mellitus with diabetic polyneuropathy: Secondary | ICD-10-CM | POA: Diagnosis not present

## 2024-11-29 DIAGNOSIS — M79674 Pain in right toe(s): Secondary | ICD-10-CM

## 2024-11-29 DIAGNOSIS — B351 Tinea unguium: Secondary | ICD-10-CM

## 2024-11-29 NOTE — Telephone Encounter (Signed)
 [employer] [facility] requested on specimen [id number], collected 06/21/2019 at [facility]. MMR and HER2 was previously completed on this specimen.

## 2024-11-29 NOTE — Progress Notes (Signed)
This patient returns to my office for at risk foot care.  This patient requires this care by a professional since this patient will be at risk due to having diet controlled diabetes.   This patient is unable to cut nails herself since the patient cannot reach her nails.These nails are painful walking and wearing shoes.  This patient presents for at risk foot care today.  General Appearance  Alert, conversant and in no acute stress.  Vascular  Dorsalis pedis and posterior tibial  pulses are palpable  bilaterally.  Capillary return is within normal limits  bilaterally. Temperature is within normal limits  bilaterally.  Neurologic  Senn-Weinstein monofilament wire test WNL  bilaterally. Muscle power within normal limits bilaterally.  Nails Thick disfigured discolored nails with subungual debris  from hallux to fifth toes bilaterally. No evidence of bacterial infection or drainage bilaterally.  Orthopedic  No limitations of motion  feet .  No crepitus or effusions noted.  No bony pathology or digital deformities noted.  HAV  B/L.  Skin  normotropic skin with no porokeratosis noted bilaterally.  No signs of infections or ulcers noted.     Onychomycosis  Pain in right toes  Pain in left toes  Diabetes    Consent was obtained for treatment procedures.   Mechanical debridement of nails 1-5  bilaterally performed with a nail nipper.  Filed with dremel without incident.     Return office visit   10 weeks                  Told patient to return for periodic foot care and evaluation due to potential at risk complications.   Derenda Giddings DPM  

## 2024-11-30 ENCOUNTER — Other Ambulatory Visit: Payer: Self-pay

## 2024-12-06 ENCOUNTER — Encounter: Payer: Self-pay | Admitting: Oncology

## 2024-12-08 ENCOUNTER — Ambulatory Visit
Admission: RE | Admit: 2024-12-08 | Discharge: 2024-12-08 | Disposition: A | Discharge status: Home / Self Care | Source: Ambulatory Visit | Attending: Internal Medicine | Admitting: Internal Medicine

## 2024-12-08 DIAGNOSIS — Z1231 Encounter for screening mammogram for malignant neoplasm of breast: Secondary | ICD-10-CM | POA: Insufficient documentation

## 2024-12-13 ENCOUNTER — Other Ambulatory Visit: Payer: Self-pay | Admitting: Oncology

## 2024-12-15 ENCOUNTER — Encounter: Payer: Self-pay | Admitting: Oncology

## 2024-12-15 ENCOUNTER — Inpatient Hospital Stay

## 2024-12-15 ENCOUNTER — Inpatient Hospital Stay (HOSPITAL_BASED_OUTPATIENT_CLINIC_OR_DEPARTMENT_OTHER): Admitting: Oncology

## 2024-12-15 ENCOUNTER — Inpatient Hospital Stay: Attending: Oncology

## 2024-12-15 VITALS — BP 143/84 | HR 64 | Resp 18

## 2024-12-15 VITALS — BP 152/65 | HR 62 | Temp 98.0°F | Resp 18 | Wt 205.9 lb

## 2024-12-15 DIAGNOSIS — C541 Malignant neoplasm of endometrium: Secondary | ICD-10-CM

## 2024-12-15 DIAGNOSIS — R911 Solitary pulmonary nodule: Secondary | ICD-10-CM | POA: Diagnosis not present

## 2024-12-15 DIAGNOSIS — E876 Hypokalemia: Secondary | ICD-10-CM | POA: Insufficient documentation

## 2024-12-15 DIAGNOSIS — Z5111 Encounter for antineoplastic chemotherapy: Secondary | ICD-10-CM | POA: Insufficient documentation

## 2024-12-15 DIAGNOSIS — M069 Rheumatoid arthritis, unspecified: Secondary | ICD-10-CM | POA: Diagnosis not present

## 2024-12-15 DIAGNOSIS — G620 Drug-induced polyneuropathy: Secondary | ICD-10-CM

## 2024-12-15 LAB — CMP (CANCER CENTER ONLY)
ALT: 10 U/L (ref 0–44)
AST: 13 U/L — ABNORMAL LOW (ref 15–41)
Albumin: 4.2 g/dL (ref 3.5–5.0)
Alkaline Phosphatase: 76 U/L (ref 38–126)
Anion gap: 12 (ref 5–15)
BUN: 19 mg/dL (ref 8–23)
CO2: 26 mmol/L (ref 22–32)
Calcium: 9.7 mg/dL (ref 8.9–10.3)
Chloride: 105 mmol/L (ref 98–111)
Creatinine: 0.86 mg/dL (ref 0.44–1.00)
GFR, Estimated: >60 mL/min (ref >60)
Glucose, Bld: 116 mg/dL — ABNORMAL HIGH (ref 70–99)
Potassium: 3.6 mmol/L (ref 3.5–5.1)
Sodium: 144 mmol/L (ref 135–145)
Total Bilirubin: 0.4 mg/dL (ref 0.0–1.2)
Total Protein: 7.6 g/dL (ref 6.5–8.1)

## 2024-12-15 LAB — CBC WITH DIFFERENTIAL (CANCER CENTER ONLY)
Abs Immature Granulocytes: 0.1 K/uL — ABNORMAL HIGH (ref 0.00–0.07)
Basophils Absolute: 0 K/uL (ref 0.0–0.1)
Basophils Relative: 1 %
Eosinophils Absolute: 0.1 K/uL (ref 0.0–0.5)
Eosinophils Relative: 1 %
HCT: 39.8 % (ref 36.0–46.0)
Hemoglobin: 12.2 g/dL (ref 12.0–15.0)
Immature Granulocytes: 1 %
Lymphocytes Relative: 33 %
Lymphs Abs: 2.8 K/uL (ref 0.7–4.0)
MCH: 27.5 pg (ref 26.0–34.0)
MCHC: 30.7 g/dL (ref 30.0–36.0)
MCV: 89.8 fL (ref 80.0–100.0)
Monocytes Absolute: 0.7 K/uL (ref 0.1–1.0)
Monocytes Relative: 8 %
Neutro Abs: 4.8 K/uL (ref 1.7–7.7)
Neutrophils Relative %: 56 %
Platelet Count: 245 K/uL (ref 150–400)
RBC: 4.43 MIL/uL (ref 3.87–5.11)
RDW: 15.9 % — ABNORMAL HIGH (ref 11.5–15.5)
WBC Count: 8.5 K/uL (ref 4.0–10.5)
nRBC: 0 % (ref 0.0–0.2)

## 2024-12-15 MED ORDER — DEXAMETHASONE SOD PHOSPHATE PF 10 MG/ML IJ SOLN
10.0000 mg | Freq: Once | INTRAMUSCULAR | Status: AC
  Administered 2024-12-15: 10:00:00 10 mg via INTRAVENOUS

## 2024-12-15 MED ORDER — SODIUM CHLORIDE 0.9 % IV SOLN
100.0000 mg/m2 | Freq: Once | INTRAVENOUS | Status: AC
  Administered 2024-12-15: 11:00:00 198 mg via INTRAVENOUS
  Filled 2024-12-15: qty 33

## 2024-12-15 MED ORDER — PALONOSETRON HCL INJECTION 0.25 MG/5ML
0.2500 mg | Freq: Once | INTRAVENOUS | Status: AC
  Administered 2024-12-15: 10:00:00 0.25 mg via INTRAVENOUS
  Filled 2024-12-15: qty 5

## 2024-12-15 MED ORDER — SODIUM CHLORIDE 0.9 % IV SOLN
INTRAVENOUS | Status: DC
  Filled 2024-12-15: qty 250

## 2024-12-15 MED ORDER — FAMOTIDINE IN NACL 20-0.9 MG/50ML-% IV SOLN
20.0000 mg | Freq: Once | INTRAVENOUS | Status: AC
  Administered 2024-12-15: 10:00:00 20 mg via INTRAVENOUS
  Filled 2024-12-15: qty 50

## 2024-12-15 MED ORDER — SODIUM CHLORIDE 0.9 % IV SOLN
405.6000 mg | Freq: Once | INTRAVENOUS | Status: AC
  Administered 2024-12-15: 15:00:00 410 mg via INTRAVENOUS
  Filled 2024-12-15: qty 41

## 2024-12-15 MED ORDER — PROCHLORPERAZINE EDISYLATE 10 MG/2ML IJ SOLN
10.0000 mg | Freq: Once | INTRAMUSCULAR | Status: AC
  Administered 2024-12-15: 11:00:00 10 mg via INTRAVENOUS
  Filled 2024-12-15: qty 2

## 2024-12-15 MED ORDER — DIPHENHYDRAMINE HCL 50 MG/ML IJ SOLN
50.0000 mg | Freq: Once | INTRAMUSCULAR | Status: AC
  Administered 2024-12-15: 11:00:00 50 mg via INTRAVENOUS
  Filled 2024-12-15: qty 1

## 2024-12-15 NOTE — Assessment & Plan Note (Signed)
 K is stable. Recommend potassium 10meq daily

## 2024-12-15 NOTE — Assessment & Plan Note (Signed)
 Stable on recent CT scans.

## 2024-12-15 NOTE — Assessment & Plan Note (Addendum)
 Off RA med while on chemotherapy.   follow up with rheumatology

## 2024-12-15 NOTE — Progress Notes (Signed)
 Hematology/Oncology Progress note [phone] [phone]    CHIEF COMPLAINTS/REASON FOR VISIT:  Follow up for serous endometrial cancer  ASSESSMENT & PLAN:   Recurrent carcinoma of endometrium (HCC) Recurrent endometrial cancer, TP 53 + Status post 6 cycles of chemotherapy with carboplatin  and Taxol . Patient with developed recurrent disease, retroperitoneal mass-> biopsy proven recurrence. pMMR status, HER2 1+  Labs are reviewed and discussed with patient.  S/p 3 cycles  carboplatin  AUC 4.5 Taxol  135mg /m2,with GCSF Repeat CT scan showed stable disease, slight decrease of retroperitoneal mass 2.5cm --> 2.1cm Patient has had moderate difficulties while on chemotherapy. We have discussed about options of chemotherapy break, monitor disease vs further dose reduction treatment and increase of interval between treatments. Patient elects to continue treatments.  Cycle 4 arboplatin AUC 4 and Taxol  100 mg/m2 wo GCSF, Q4 weeks  Labs are reviewed and discussed with patient. Tolerated with dose reduced regimen better. Counts are stable.  Proceed with cycle 5 dose reduced carboplatin  AUC 4 and Taxol  100 mg/m2.  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   Overall improved. She has self discontinued gabapentin , manageable symptoms.   Rheumatoid arthritis (HCC) Off RA med while on chemotherapy.  follow up with rheumatology   Hypokalemia K is stable. Recommend potassium 10meq daily  Lung nodule Stable on recent CT scans.    Orders Placed This Encounter  Procedures   CA 125    Standing Status:   Future    Expected Date:   01/12/2025    Expiration Date:   01/12/2026   CBC with Differential (Cancer Center Only)    Standing Status:   Future    Expected Date:   01/12/2025    Expiration Date:   01/12/2026   CMP (Cancer Center only)    Standing Status:   Future    Expected Date:   01/12/2025    Expiration Date:   01/12/2026    Follow-up 4 weeks All questions were answered. The patient knows to call the clinic with any problems, questions or concerns.  [name], MD, PhD [facility] Hematology Oncology 12/15/2024   HISTORY OF PRESENTING ILLNESS:   Sandra Fischer is a  73 y.o.  female presents for high-grade serous endometrial cancer Oncology History  Recurrent carcinoma of endometrium (HCC)  07/15/2019 Initial Diagnosis   Endometrial cancer  Patient had TH/BSO sentinel lymph node biopsy by [facility] GYN oncology Dr. Augustin on 06/21/2019. Extensive medical records from [facility] health system review was performed by me.  Tumor size 9.3 cm, invading 99% of myometrium [3.55 out of 3.6 cm], positive right external iliac sentinel lymph nodes.  Negative washing.  No LVI, serosa is uninvolved. 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 [id number]  FIGO stage IIIC1 MSI intact HER 2 negative.   her case was discussed at [facility] tumor board on 07/04/2019  PORTEC-3 regimen was recommended given subgroup analysis showing benefit in the serous histology  Patient declined treatment.     10/20/2019 Relapse/Recurrence   CT chest abdomen pelvis with contrast showed disease recurrence. There are multiple newly enlarged retroperitoneal and right iliac lymph nodes the largest the left retroperitoneal node measuring 1.9 x 1.6 cm concerning for nodal metastasis disease. Unchanged 4 mm groundglass pulmonary nodule of the left pulmonary apex. This remains nonspecific.    11/07/2019 - 02/20/2020 Chemotherapy   carboplatin  AUC 4.5 and paclitaxel  175 mg/m2 x 2 cycles carboplatin  AUC4.5 and paclitaxel  135 mg/m2 x 4 cycles  03/12/2020 Imaging   CT chest abdomen pelvis with contrast showed 1. The multiple enlarged retroperitoneal and right iliac lymph nodes identified as new on the previous exam from 10/20/2019 have resolved completely in the interval. 2. Tiny ground-glass pulmonary nodule medial left  lung apex is stable in the interval. Likely benign, continued attention on follow-up recommended. 3. No new or progressive interval findings. 4.  Aortic Atherosclerois (ICD10-170.0   06/07/2024 Imaging   CT chest abdomen pelvis with contrast showed 1. Newly enlarged left retroperitoneal lymph nodes measuring up to 2.5 x 2.2 cm, consistent with nodal metastatic disease. 2. Occasional tiny pulmonary nodules unchanged, most likely benign and incidental. 3. Status post hysterectomy. 4. Nonobstructive left nephrolithiasis.   06/21/2024 Relapse/Recurrence   Periaortic left side soft tissue mass biopsy showed metastatic poorly differentiated carcinoma.    Diagnosis Note : The specimen demonstrates a pleomorphic high-grade epithelioid proliferation. Immunohistochemical stains were performed to characterize the tumor cells. The cells are positive for CK AE1/AE3, GATA3, CDX2, and p40. P53 is mutant type of staining. The cells are negative for PAX8, TTF-1, WT1, ER, -PR, S100 and SOX10. The immunoprofile is not specific and is not indicative of a specific site of primary. The patient's history of endometrial carcinoma is notes. The tumor most likely represents a metastasis from the patient's known endometrial carcinoma since metastatic tumor cells could aberrant loss or gain expression of lineage markers. However, metastasis from other organ system cannot be entirely excluded. Correlation with imaging findings is recommended. Controls worked appropriately.    07/08/2024 -  Chemotherapy   Patient is on Treatment Plan : UTERINE Carboplatin  + Paclitaxel  q21d       INTERVAL HISTORY Sandra Fischer is a 73 y.o. female who has above history reviewed by me today presents for follow up visit for management of endometrial cancer.  She report feeling better since last visit.  Still has bone pain after chemotherapy.  No nausea vomiting diarrhea. No fever or chills.  She uses topical nystatin  cream PRN.      Review of Systems  Constitutional:  Positive for fatigue. Negative for appetite change, chills and fever.  HENT:   Negative for hearing loss and voice change.   Eyes:  Negative for eye problems.  Respiratory:  Negative for chest tightness and cough.   Cardiovascular:  Negative for chest pain.  Gastrointestinal:  Negative for abdominal distention, abdominal pain, blood in stool and diarrhea.  Endocrine: Negative for hot flashes.  Genitourinary:  Negative for difficulty urinating and frequency.   Musculoskeletal:  Negative for arthralgias.  Skin:  Negative for itching and rash.  Neurological:  Positive for numbness. Negative for extremity weakness.  Hematological:  Negative for adenopathy.  Psychiatric/Behavioral:  Negative for confusion.     MEDICAL HISTORY:  Past Medical History:  Diagnosis Date   Asthma    Asthma    Asthma exacerbation 08/22/2016   Cancer (HCC)    Endometrial   Collagen vascular disease    Diabetes mellitus without complication (HCC)    History of Diabetes    Dyspnea    Environmental allergies    Fibromyalgia    High cholesterol    Hypertension    Hypothyroidism    Iron  deficiency anemia 07/03/2020   Neuropathy    RA (rheumatoid arthritis) (HCC)    Sleep apnea    Thyroid  disease     SURGICAL HISTORY: Past Surgical History:  Procedure Laterality Date   ABDOMINAL HYSTERECTOMY     CATARACT EXTRACTION  CHOLECYSTECTOMY     COLONOSCOPY WITH PROPOFOL  N/A 03/14/2022   Procedure: COLONOSCOPY WITH PROPOFOL ;  Surgeon: Maryruth Ole DASEN, MD;  [facility];  Service: Endoscopy;  Laterality: N/A;  DM   CYSTOURETHROSCOPY     DIAGNOSTIC LAPAROSCOPY     EYE SURGERY     fibroids removed     PORTA CATH REMOVAL N/A 02/14/2021   Procedure: PORTA CATH REMOVAL;  Surgeon: Marea Selinda RAMAN, MD;  Location: [facility] INVASIVE CV LAB;  Service: Cardiovascular;  Laterality: N/A;   THYROIDECTOMY, PARTIAL     tummy tuck      SOCIAL HISTORY: Social History    Socioeconomic History   Marital status: Married    Spouse name: Cedric    Number of children: Not on file   Years of education: Not on file   Highest education level: Not on file  Occupational History   Occupation: Retired  Tobacco Use   Smoking status: Former    Current packs/day: 0.00    Average packs/day: 0.1 packs/day for 0.2 years    Types: Cigarettes    Start date: 09/1978    Quit date: 1980    Years since quitting: 45.9   Smokeless tobacco: Never  Vaping Use   Vaping status: Never Used  Substance and Sexual Activity   Alcohol use: No   Drug use: No   Sexual activity: Yes    Birth control/protection: Post-menopausal  Other Topics Concern   Not on file  Social History Narrative   Not on file   Social Drivers of Health   Tobacco Use: Medium Risk (12/15/2024)   Patient History    Smoking Tobacco Use: Former    Smokeless Tobacco Use: Never    Passive Exposure: Not on Actuary Strain: Not on file  Food Insecurity: Not on file  Transportation Needs: Not on file  Physical Activity: Not on file  Stress: Not on file  Social Connections: Not on file  Intimate Partner Violence: Not on file  Depression (PHQ2-9): Low Risk (11/16/2024)   Depression (PHQ2-9)    PHQ-2 Score: 0  Alcohol Screen: Not on file  Housing: Unknown (08/04/2024)   Received from [facility]   Epic    Unable to Pay for Housing in the Last Year: Not on file    Number of Times Moved in the Last Year: Not on file    At any time in the past 12 months, were you homeless or living in a shelter (including now)?: No  Utilities: Not on file  Health Literacy: Not on file    FAMILY HISTORY: Family History  Problem Relation Age of Onset   Diabetes Mother    Hypertension Mother    Hyperlipidemia Mother    Dementia Mother    Breast cancer Neg Hx    Ovarian cancer Neg Hx    Colon cancer Neg Hx     ALLERGIES:  is allergic to abatacept, codeine, iodine, latex,  shellfish allergy, trimethoprim  hydrochloride [trimethoprim ], metformin, and penicillins.  MEDICATIONS:  Current Outpatient Medications  Medication Sig Dispense Refill   Acetaminophen  (TYLENOL  EXTRA STRENGTH PO) Take 1,300 mg by mouth 2 (two) times daily as needed.     ascorbic acid (VITAMIN C) 500 MG tablet Take 500 mg by mouth daily.     aspirin  EC 81 MG tablet Take 81 mg by mouth daily.     azelastine  (ASTELIN ) 0.1 % nasal spray Place 2 sprays into both nostrils 2 (two) times daily. 30 mL  5   Calcium Carbonate-Vit D-Min (CALCIUM 600+D PLUS MINERALS) 600-400 MG-UNIT TABS Take 1 tablet by mouth 2 (two) times daily.     cetirizine  (ZYRTEC ) 10 MG tablet Take 1 tablet (10 mg total) by mouth daily. 30 tablet 0   chlorpheniramine-HYDROcodone  (TUSSIONEX) 10-8 MG/5ML Take 5 mLs by mouth every 12 (twelve) hours as needed for cough. 120 mL 0   Cholecalciferol (D3 ADULT PO) Take 1 capsule by mouth daily.     clotrimazole-betamethasone (LOTRISONE) cream Apply topically 4 (four) times daily as needed.     desloratadine (CLARINEX) 5 MG tablet Take 5 mg by mouth daily.     dexamethasone  (DECADRON ) 4 MG tablet Take 2 tablets (8 mg total) by mouth daily for 3 days. Start the day after chemotherapy. Take with food. 30 tablet 1   empagliflozin (JARDIANCE) 25 MG TABS tablet Take 12.5 mg by mouth daily.     fluticasone  (FLONASE ) 50 MCG/ACT nasal spray Place 1 spray into both nostrils 2 (two) times daily. 16 g 0   fluticasone -salmeterol (ADVAIR) 250-50 MCG/ACT AEPB Inhale 1 puff into the lungs in the morning and at bedtime.     furosemide (LASIX) 10 MG/ML solution Take by mouth daily.     gabapentin  (NEURONTIN ) 300 MG capsule Take 1 capsule (300 mg total) by mouth 2 (two) times daily. 60 capsule 0   hydrALAZINE (APRESOLINE) 25 MG tablet Take 25 mg by mouth 2 (two) times daily.     ipratropium (ATROVENT ) 0.02 % nebulizer solution Take 0.5 mg by nebulization every 4 (four) hours as needed.      levothyroxine  (SYNTHROID) 75 MCG tablet Take 75 mcg by mouth daily before breakfast.     lisinopril (PRINIVIL,ZESTRIL) 40 MG tablet Take 20 mg by mouth in the morning and at bedtime.      metoprolol succinate (TOPROL-XL) 50 MG 24 hr tablet Take 50 mg by mouth daily. Take with or immediately following a meal.     montelukast  (SINGULAIR ) 10 MG tablet Take 10 mg by mouth at bedtime.     nortriptyline (PAMELOR) 25 MG capsule Take 25 mg by mouth at bedtime.     nystatin  cream (MYCOSTATIN ) Apply 1 Application topically 2 (two) times daily. 30 g 3   olopatadine  (PATANOL) 0.1 % ophthalmic solution Place 1 drop into both eyes 2 (two) times daily. 5 mL 5   omeprazole (PRILOSEC) 10 MG capsule Take 20 mg by mouth daily.     ondansetron  (ZOFRAN ) 8 MG tablet Take 1 tablet (8 mg total) by mouth every 8 (eight) hours as needed for nausea or vomiting. Start on the third day after chemotherapy. 30 tablet 1   ondansetron  (ZOFRAN -ODT) 4 MG disintegrating tablet Take 1 tablet (4 mg total) by mouth every 8 (eight) hours as needed for nausea or vomiting. 15 tablet 0   pravastatin (PRAVACHOL) 40 MG tablet Take 40 mg by mouth daily.     predniSONE  (DELTASONE ) 5 MG tablet Take 5 mg by mouth daily with breakfast.     Prenatal Vit-Fe Fumarate-FA (MULTIVITAMIN-PRENATAL) 27-0.8 MG TABS tablet Take 1 tablet by mouth daily.      prochlorperazine  (COMPAZINE ) 10 MG tablet Take 1 tablet (10 mg total) by mouth every 6 (six) hours as needed for nausea or vomiting. 30 tablet 1   senna-docusate (SENOKOT-S) 8.6-50 MG tablet Take 1 tablet by mouth at bedtime as needed for mild constipation.     sulfaSALAzine (AZULFIDINE) 500 MG tablet TAKE ONE TABLET BY MOUTH TWO TIMES A DAY  FOR 14 DAYS, THEN TAKE TWO TABLETS TWO TIMES A DAY FOR 14 DAYS, THEN TAKE THREE TABLETS TWO TIMES A DAY FOR 62 DAYS FOR INFLAMMATION. TAKE AFTER MEALS     tacrolimus (PROTOPIC) 0.1 % ointment Apply 1 Application topically 2 (two) times daily.     tiotropium (SPIRIVA) 18 MCG  inhalation capsule Place 18 mcg into inhaler and inhale daily.     verapamil (CALAN) 120 MG tablet Take 240 mg by mouth 2 (two) times daily.     leflunomide (ARAVA) 20 MG tablet Take 20 mg by mouth daily. (Patient not taking: Reported on 12/15/2024)     No current facility-administered medications for this visit.   Facility-Administered Medications Ordered in Other Visits  Medication Dose Route Frequency Provider Last Rate Last Admin   0.9 %  sodium chloride  infusion   Intravenous Continuous Babara Call, MD 10 mL/hr at 12/15/24 1006 New Bag at 12/15/24 1006   CARBOplatin  (PARAPLATIN ) 410 mg in sodium chloride  0.9 % 250 mL chemo infusion  410 mg Intravenous Once Babara Call, MD       PACLitaxel  (TAXOL ) 198 mg in sodium chloride  0.9 % 250 mL chemo infusion (> 80mg /m2)  100 mg/m2 (Order-Specific) Intravenous Once Gildo Crisco, MD 94 mL/hr at 12/15/24 1116 198 mg at 12/15/24 1116     PHYSICAL EXAMINATION: ECOG PERFORMANCE STATUS: 1 - Symptomatic but completely ambulatory  Physical Exam Constitutional:      General: She is not in acute distress.    Appearance: She is obese.  HENT:     Head: Normocephalic and atraumatic.  Eyes:     General: No scleral icterus. Cardiovascular:     Rate and Rhythm: Normal rate and regular rhythm.     Heart sounds: Normal heart sounds.  Pulmonary:     Effort: Pulmonary effort is normal. No respiratory distress.     Breath sounds: Normal breath sounds. No wheezing.  Abdominal:     General: Bowel sounds are normal. There is no distension.     Palpations: Abdomen is soft.  Musculoskeletal:        General: Deformity present. Normal range of motion.     Cervical back: Normal range of motion and neck supple.  Skin:    General: Skin is warm and dry.     Findings: No erythema or rash.  Neurological:     Mental Status: She is alert and oriented to person, place, and time. Mental status is at baseline.  Psychiatric:     Comments: Feel frustrated.      LABORATORY  DATA:  I have reviewed the data as listed    Latest Ref Rng & Units 12/15/2024    8:59 AM 11/16/2024    8:46 AM 11/10/2024    3:36 PM  CBC  WBC 4.0 - 10.5 K/uL 8.5  9.4  11.8   Hemoglobin 12.0 - 15.0 g/dL 87.7  88.0  88.0   Hematocrit 36.0 - 46.0 % 39.8  38.4  38.7   Platelets 150 - 400 K/uL 245  307  333       Latest Ref Rng & Units 12/15/2024    8:58 AM 11/16/2024    8:46 AM 11/10/2024    3:36 PM  CMP  Glucose 70 - 99 mg/dL 883  874  889   BUN 8 - 23 mg/dL 19  22  18    Creatinine 0.44 - 1.00 mg/dL 9.13  9.06  9.08   Sodium 135 - 145 mmol/L 144  135  140  Potassium 3.5 - 5.1 mmol/L 3.6  3.7  4.2   Chloride 98 - 111 mmol/L 105  102  102   CO2 22 - 32 mmol/L 26  22  26    Calcium 8.9 - 10.3 mg/dL 9.7  9.1  89.8   Total Protein 6.5 - 8.1 g/dL 7.6  7.7    Total Bilirubin 0.0 - 1.2 mg/dL 0.4  0.6    Alkaline Phos 38 - 126 U/L 76  60    AST 15 - 41 U/L 13  14    ALT 0 - 44 U/L 10  11       Iron /TIBC/Ferritin/ %Sat    Component Value Date/Time   IRON  59 10/06/2024 0827   TIBC 279 10/06/2024 0827   FERRITIN 235 10/06/2024 0827   IRONPCTSAT 21 10/06/2024 0827      RADIOGRAPHIC STUDIES: I have personally reviewed the radiological images as listed and agreed with the findings in the report., MM 3D SCREENING MAMMOGRAM BILATERAL BREAST Result Date: 12/14/2024 CLINICAL DATA:  Screening. EXAM: DIGITAL SCREENING BILATERAL MAMMOGRAM WITH TOMOSYNTHESIS AND CAD TECHNIQUE: Bilateral screening digital craniocaudal and mediolateral oblique mammograms were obtained. Bilateral screening digital breast tomosynthesis was performed. The images were evaluated with computer-aided detection. COMPARISON:  Previous exam(s). ACR Breast Density Category c: The breasts are heterogeneously dense, which may obscure small masses. FINDINGS: There are no findings suspicious for malignancy. IMPRESSION: No mammographic evidence of malignancy. A result letter of this screening mammogram will be mailed directly  to the patient. RECOMMENDATION: Screening mammogram in one year. (Code:[id number]) BI-RADS CATEGORY  1: Negative. Electronically Signed   By: Alm Parkins M.D.   On: 12/14/2024 14:46   DG Chest 2 View Result Date: 11/10/2024 CLINICAL DATA:  Chest pain EXAM: CHEST - 2 VIEW COMPARISON:  Chest x-ray 10/09/2024 FINDINGS: The heart size and mediastinal contours are within normal limits. Both lungs are clear. The visualized skeletal structures are unremarkable. IMPRESSION: No active cardiopulmonary disease. Electronically Signed   By: Greig Pique M.D.   On: 11/10/2024 15:47   MM Outside Films Mammo Result Date: 11/04/2024 This examination belongs to an outside facility and is stored here for comparison purposes only.  Contact the originating outside institution for any associated report or interpretation.  MM Outside Films Mammo Result Date: 11/04/2024 This examination belongs to an outside facility and is stored here for comparison purposes only.  Contact the originating outside institution for any associated report or interpretation.  MM Outside Films Mammo Result Date: 11/04/2024 This examination belongs to an outside facility and is stored here for comparison purposes only.  Contact the originating outside institution for any associated report or interpretation.  MM Outside Films Mammo Result Date: 11/04/2024 This examination belongs to an outside facility and is stored here for comparison purposes only.  Contact the originating outside institution for any associated report or interpretation.  CT CHEST ABDOMEN PELVIS W CONTRAST Result Date: 10/13/2024 CLINICAL DATA:  Follow-up metastatic endometrial carcinoma. * Tracking Code: BO * EXAM: CT CHEST, ABDOMEN, AND PELVIS WITH CONTRAST TECHNIQUE: Multidetector CT imaging of the chest, abdomen and pelvis was performed following the standard protocol during bolus administration of intravenous contrast. RADIATION DOSE REDUCTION: This exam was performed  according to the departmental dose-optimization program which includes automated exposure control, adjustment of the mA and/or kV according to patient size and/or use of iterative reconstruction technique. CONTRAST:  OMNIPAQUE  IOHEXOL  300 MG/ML  SOLN COMPARISON:  06/09/2024 FINDINGS: CT CHEST FINDINGS Cardiovascular: No acute findings. Mediastinum/Lymph Nodes:  No masses or pathologically enlarged lymph nodes identified. Lungs/Pleura: No suspicious pulmonary nodules or masses identified. No evidence of infiltrate or pleural effusion. Musculoskeletal:  No suspicious bone lesions identified. CT ABDOMEN AND PELVIS FINDINGS Hepatobiliary: No masses identified. Prior cholecystectomy. No evidence of biliary obstruction. Pancreas:  No mass or inflammatory changes. Spleen:  Within normal limits in size and appearance. Adrenals/Urinary tract: No suspicious masses or hydronephrosis. 3 mm nonobstructing calculus again seen in lower pole of left kidney. Unremarkable unopacified urinary bladder. Stomach/Bowel: No evidence of obstruction, inflammatory process, or abnormal fluid collections. Left colonic diverticulosis is again seen, without signs of diverticulitis. Vascular/Lymphatic: Mild retroperitoneal lymphadenopathy is again seen in the left paraaortic region, with largest lymph node measuring 2.1 cm short axis. This shows no significant change compared to prior study. No new or increased areas of lymphadenopathy identified. No acute vascular findings. Reproductive: Prior hysterectomy noted. Vaginal cuff and adnexal regions are unremarkable in appearance. No masses or free fluid identified. Other:  None. Musculoskeletal:  No suspicious bone lesions identified. IMPRESSION: Stable mild abdominal retroperitoneal lymphadenopathy, consistent with metastatic disease. No new or progressive metastatic disease within the chest, abdomen, or pelvis. Colonic diverticulosis, without radiographic evidence of diverticulitis. Tiny  nonobstructing left renal calculus. Electronically Signed   By: Norleen DELENA Kil M.D.   On: 10/13/2024 11:00   DG Chest Port 1 View Result Date: 10/09/2024 CLINICAL DATA:  fever, chemo pt EXAM: PORTABLE CHEST 1 VIEW COMPARISON:  11/18/2023. FINDINGS: Bilateral lung fields are clear. Bilateral costophrenic angles are clear. Normal cardio-mediastinal silhouette. No acute osseous abnormalities. The soft tissues are within normal limits. IMPRESSION: No active disease. Electronically Signed   By: Ree Molt M.D.   On: 10/09/2024 15:31

## 2024-12-15 NOTE — Progress Notes (Signed)
 Patient states when they did the hysterectomy did they take out the ovaries too? 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

## 2024-12-15 NOTE — Assessment & Plan Note (Signed)
#  Neuropathy due to chemotherapy.   Overall improved. She has self discontinued gabapentin , manageable symptoms.

## 2024-12-15 NOTE — Assessment & Plan Note (Addendum)
 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 Patient with developed recurrent disease, retroperitoneal mass-> biopsy proven recurrence. pMMR status, HER2 1+  Labs are reviewed and discussed with patient.  S/p 3 cycles  carboplatin  AUC 4.5 Taxol  135mg /m2,with GCSF Repeat CT scan showed stable disease, slight decrease of retroperitoneal mass 2.5cm --> 2.1cm Patient has had moderate difficulties while on chemotherapy. We have discussed about options of chemotherapy break, monitor disease vs further dose reduction treatment and increase of interval between treatments. Patient elects to continue treatments.  Cycle 4 arboplatin AUC 4 and Taxol  100 mg/m2 wo GCSF, Q4 weeks  Labs are reviewed and discussed with patient. Tolerated with dose reduced regimen better. Counts are stable.  Proceed with cycle 5 dose reduced carboplatin  AUC 4 and Taxol  100 mg/m2.  Wo G-CSF

## 2024-12-16 ENCOUNTER — Encounter: Payer: Self-pay | Admitting: Oncology

## 2024-12-16 LAB — CA 125: Cancer Antigen (CA) 125: 13.3 U/mL (ref 0.0–38.1)

## 2025-01-05 ENCOUNTER — Inpatient Hospital Stay

## 2025-01-05 ENCOUNTER — Inpatient Hospital Stay: Admitting: Oncology

## 2025-01-08 ENCOUNTER — Emergency Department

## 2025-01-08 ENCOUNTER — Other Ambulatory Visit: Payer: Self-pay

## 2025-01-08 ENCOUNTER — Emergency Department
Admission: EM | Admit: 2025-01-08 | Discharge: 2025-01-08 | Disposition: A | Discharge status: Home / Self Care | Attending: Emergency Medicine | Admitting: Emergency Medicine

## 2025-01-08 DIAGNOSIS — J029 Acute pharyngitis, unspecified: Secondary | ICD-10-CM | POA: Insufficient documentation

## 2025-01-08 DIAGNOSIS — R062 Wheezing: Secondary | ICD-10-CM | POA: Insufficient documentation

## 2025-01-08 DIAGNOSIS — E1140 Type 2 diabetes mellitus with diabetic neuropathy, unspecified: Secondary | ICD-10-CM | POA: Insufficient documentation

## 2025-01-08 DIAGNOSIS — R059 Cough, unspecified: Secondary | ICD-10-CM | POA: Insufficient documentation

## 2025-01-08 DIAGNOSIS — N3000 Acute cystitis without hematuria: Secondary | ICD-10-CM | POA: Diagnosis not present

## 2025-01-08 DIAGNOSIS — I10 Essential (primary) hypertension: Secondary | ICD-10-CM | POA: Insufficient documentation

## 2025-01-08 DIAGNOSIS — J111 Influenza due to unidentified influenza virus with other respiratory manifestations: Secondary | ICD-10-CM

## 2025-01-08 DIAGNOSIS — E039 Hypothyroidism, unspecified: Secondary | ICD-10-CM | POA: Insufficient documentation

## 2025-01-08 DIAGNOSIS — R109 Unspecified abdominal pain: Secondary | ICD-10-CM | POA: Diagnosis present

## 2025-01-08 LAB — URINALYSIS, ROUTINE W REFLEX MICROSCOPIC
Bilirubin Urine: NEGATIVE
Glucose, UA: >=500 mg/dL — AB
Hgb urine dipstick: NEGATIVE
Ketones, ur: 20 mg/dL — AB
Nitrite: NEGATIVE
Protein, ur: 30 mg/dL — AB
Specific Gravity, Urine: 1.029 (ref 1.005–1.030)
pH: 5 (ref 5.0–8.0)

## 2025-01-08 LAB — CBC WITH DIFFERENTIAL/PLATELET
Abs Immature Granulocytes: 0.09 K/uL — ABNORMAL HIGH (ref 0.00–0.07)
Basophils Absolute: 0 K/uL (ref 0.0–0.1)
Basophils Relative: 0 %
Eosinophils Absolute: 0.1 K/uL (ref 0.0–0.5)
Eosinophils Relative: 1 %
HCT: 38.6 % (ref 36.0–46.0)
Hemoglobin: 11.8 g/dL — ABNORMAL LOW (ref 12.0–15.0)
Immature Granulocytes: 1 %
Lymphocytes Relative: 35 %
Lymphs Abs: 3.9 K/uL (ref 0.7–4.0)
MCH: 26.9 pg (ref 26.0–34.0)
MCHC: 30.6 g/dL (ref 30.0–36.0)
MCV: 88.1 fL (ref 80.0–100.0)
Monocytes Absolute: 1.1 K/uL — ABNORMAL HIGH (ref 0.1–1.0)
Monocytes Relative: 10 %
Neutro Abs: 5.9 K/uL (ref 1.7–7.7)
Neutrophils Relative %: 53 %
Platelets: 211 K/uL (ref 150–400)
RBC: 4.38 MIL/uL (ref 3.87–5.11)
RDW: 16.1 % — ABNORMAL HIGH (ref 11.5–15.5)
WBC: 11.1 K/uL — ABNORMAL HIGH (ref 4.0–10.5)
nRBC: 0 % (ref 0.0–0.2)

## 2025-01-08 LAB — COMPREHENSIVE METABOLIC PANEL WITH GFR
ALT: 9 U/L (ref 0–44)
AST: 16 U/L (ref 15–41)
Albumin: 3.9 g/dL (ref 3.5–5.0)
Alkaline Phosphatase: 75 U/L (ref 38–126)
Anion gap: 14 (ref 5–15)
BUN: 11 mg/dL (ref 8–23)
CO2: 25 mmol/L (ref 22–32)
Calcium: 10.2 mg/dL (ref 8.9–10.3)
Chloride: 98 mmol/L (ref 98–111)
Creatinine, Ser: 0.78 mg/dL (ref 0.44–1.00)
GFR, Estimated: >60 mL/min
Glucose, Bld: 113 mg/dL — ABNORMAL HIGH (ref 70–99)
Potassium: 3.6 mmol/L (ref 3.5–5.1)
Sodium: 137 mmol/L (ref 135–145)
Total Bilirubin: 0.5 mg/dL (ref 0.0–1.2)
Total Protein: 7.7 g/dL (ref 6.5–8.1)

## 2025-01-08 LAB — RESP PANEL BY RT-PCR (RSV, FLU A&B, COVID)  RVPGX2
Influenza A by PCR: NEGATIVE
Influenza B by PCR: NEGATIVE
Resp Syncytial Virus by PCR: NEGATIVE
SARS Coronavirus 2 by RT PCR: NEGATIVE

## 2025-01-08 LAB — PRO BRAIN NATRIURETIC PEPTIDE: Pro Brain Natriuretic Peptide: <50 pg/mL

## 2025-01-08 LAB — D-DIMER, QUANTITATIVE: D-Dimer, Quant: 1.96 ug{FEU}/mL — ABNORMAL HIGH (ref 0.00–0.50)

## 2025-01-08 MED ORDER — NITROFURANTOIN MONOHYD MACRO 100 MG PO CAPS: 100.0000 mg | ORAL_CAPSULE | Freq: Two times a day (BID) | ORAL | 0 refills | Status: AC

## 2025-01-08 MED ORDER — IPRATROPIUM-ALBUTEROL 0.5-2.5 (3) MG/3ML IN SOLN
3.0000 mL | Freq: Once | RESPIRATORY_TRACT | Status: AC
  Administered 2025-01-08: 3 mL via RESPIRATORY_TRACT
  Filled 2025-01-08: qty 3

## 2025-01-08 MED ORDER — PREDNISONE 20 MG PO TABS
60.0000 mg | ORAL_TABLET | Freq: Once | ORAL | Status: AC
  Administered 2025-01-08: 60 mg via ORAL
  Filled 2025-01-08: qty 3

## 2025-01-08 MED ORDER — PREDNISONE 50 MG PO TABS: 50.0000 mg | ORAL_TABLET | Freq: Every day | ORAL | 0 refills | Status: AC

## 2025-01-08 MED ORDER — NITROFURANTOIN MONOHYD MACRO 100 MG PO CAPS
100.0000 mg | ORAL_CAPSULE | Freq: Once | ORAL | Status: AC
  Administered 2025-01-08: 100 mg via ORAL
  Filled 2025-01-08: qty 1

## 2025-01-08 NOTE — ED Provider Notes (Signed)
 "  [facility] Provider Note    Event Date/Time   First MD Initiated Contact with Patient 01/08/25 1507     (approximate)   History   Generalized Body Aches    HPI  Sandra Fischer is a 74 y.o. female    with a past medical history of carcinoma of endometrium hypertension, pleurisy, pharyngitis,, with no significant past medical history who presents to the ED complaining of bodyaches. According to the patient in terms of started 5 days ago with sore throat, chills, cough, decreased appetite, nauseas, body aches, wheezing, abdominal pain.  Patient denies fever, diarrhea.  Patient is in treatment for cancer.  Patient will have next treatment this Thursday.  Patient is here with her husband.     Patient Active Problem List   Diagnosis Date Noted   Acute bronchitis 09/09/2024   Vaginal candidiasis 09/09/2024   Hypokalemia 08/19/2024   Neutropenia 07/19/2024   Lung nodule 02/09/2023   Age-related osteoporosis without current pathological fracture 04/22/2022   Diabetes mellitus without complication (HCC) 04/03/2021   Nuclear sclerotic cataract of right eye 04/03/2021   Severe persistent asthma (HCC) 04/03/2021   Problem related to unspecified psychosocial circumstances 04/03/2021   Corn of foot 04/03/2021   Dependence on continuous positive airway pressure ventilation 04/03/2021   Dermatophytosis of nail 04/03/2021   Type 2 or unspecified type diabetes mellitus 04/03/2021   Disorder of bone and cartilage 04/03/2021   Dysphagia 04/03/2021   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   History of recurrent pneumonia 04/03/2021   Irritable colon 04/03/2021   Low back pain 04/03/2021   Other alveolar and parietoalveolar pneumonopathy 04/03/2021   Other and unspecified hyperlipidemia 04/03/2021   Other specified disease of nail 04/03/2021   Pain in joint, ankle and foot 04/03/2021   Polyp of colon 04/03/2021   Seborrheic dermatitis 04/03/2021   SVT (supraventricular  tachycardia) 04/03/2021   Vitiligo 04/03/2021   Vocal cord dysfunction 04/03/2021   Obesity 04/03/2021   Allergic asthma 04/03/2021   Obstructive sleep apnea 04/03/2021   Encounter for long-term (current) use of steroids 04/03/2021   Diabetic neuropathy (HCC) 08/02/2020   Iron  deficiency anemia 07/03/2020   Vaginal discharge 05/30/2020   Encounter for antineoplastic chemotherapy 01/30/2020   Neuropathy due to chemotherapeutic drug 01/30/2020   Anemia 01/30/2020   Gastroesophageal reflux disease 12/08/2019   Microcytic anemia 11/07/2019   Port-A-Cath in place 11/04/2019   Rheumatoid arthritis (HCC) 10/29/2019   Goals of care, counseling/discussion 07/20/2019   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   Acute blood loss anemia 06/22/2019   Hypothyroidism, adult 06/15/2019   Myalgia and myositis 06/15/2019   Fibromyalgia    Cardiomyopathy (HCC) 12/06/2018   Atypical chest pain 12/06/2018   Perennial and seasonal allergic rhinitis 02/09/2018   Moderate persistent asthma 02/09/2018   Allergic conjunctivitis 02/09/2018   History of food allergy 02/09/2018   Allergic reaction 02/09/2018   RA (rheumatoid arthritis) (HCC) 08/22/2016   HTN (hypertension) 08/22/2016     Physical Exam   Triage Vital Signs: ED Triage Vitals  Encounter Vitals Group     BP 01/08/25 1249 113/68     Girls Systolic BP Percentile --      Girls Diastolic BP Percentile --      Boys Systolic BP Percentile --      Boys Diastolic BP Percentile --      Pulse Rate 01/08/25 1249 (!) 109     Resp --      Temp 01/08/25 1249 99.6  F (37.6 C)     Temp Source 01/08/25 1249 Oral     SpO2 01/08/25 1249 95 %     Weight 01/08/25 1248 206 lb (93.4 kg)     Height 01/08/25 1248 5' 2 (1.575 m)     Head Circumference --      Peak Flow --      Pain Score 01/08/25 1248 7     Pain Loc --      Pain Education --      Exclude from Growth Chart --     Most recent vital signs: Vitals:   01/08/25 1249  BP:  113/68  Pulse: (!) 109  Temp: 99.6 F (37.6 C)  SpO2: 95%     Physical Exam Vitals and nursing note reviewed.  During triage patient was tachycardic.  Afebrile.  General:          Awake, no distress.  No active cough during physical exam.  No use of accessory muscles.  No signs of difficulty breathing Throat: No peritonsillar erythema or tonsillar enlargement, no exudate. Ears: Bilateral otoscopy within normal limits. CV:                  Good peripheral perfusion. Regular rate and rhythm. Resp:               Normal effort. no tachypnea.Equal breath sounds bilaterally.  Expiratory wheezing in the right base. Abd:        Bowel sounds positive, skin is intact,         no distention.  Soft , tender to palpation in suprapubic area.  Negative McBurney point, no signs of peritoneal irritation. Other:               ED Results / Procedures / Treatments   Labs (all labs ordered are listed, but only abnormal results are displayed) Labs Reviewed  COMPREHENSIVE METABOLIC PANEL WITH GFR - Abnormal; Notable for the following components:      Result Value   Glucose, Bld 113 (*)    All other components within normal limits  CBC WITH DIFFERENTIAL/PLATELET - Abnormal; Notable for the following components:   WBC 11.1 (*)    Hemoglobin 11.8 (*)    RDW 16.1 (*)    Monocytes Absolute 1.1 (*)    Abs Immature Granulocytes 0.09 (*)    All other components within normal limits  D-DIMER, QUANTITATIVE - Abnormal; Notable for the following components:   D-Dimer, Quant 1.96 (*)    All other components within normal limits  URINALYSIS, ROUTINE W REFLEX MICROSCOPIC - Abnormal; Notable for the following components:   Color, Urine AMBER (*)    APPearance CLOUDY (*)    Glucose, UA >=500 (*)    Ketones, ur 20 (*)    Protein, ur 30 (*)    Leukocytes,Ua TRACE (*)    Bacteria, UA RARE (*)    All other components within normal limits  RESP PANEL BY RT-PCR (RSV, FLU A&B, COVID)  [id number]  URINE CULTURE  PRO  BRAIN NATRIURETIC PEPTIDE       RADIOLOGY I independently reviewed and interpreted imaging and agree with radiologists findings.      PROCEDURES:  Critical Care performed:   Procedures   MEDICATIONS ORDERED IN ED: Medications  nitrofurantoin  (macrocrystal-monohydrate) (MACROBID ) capsule 100 mg (has no administration in time range)  ipratropium-albuterol  (DUONEB) 0.5-2.5 (3) MG/3ML nebulizer solution 3 mL (3 mLs Nebulization Given 01/08/25 1608)  predniSONE  (DELTASONE ) tablet 60 mg (60 mg Oral Given  01/08/25 1605)   Clinical Course as of 01/08/25 1759  Sun Jan 08, 2025  1509 Resp panel by RT-PCR (RSV, Flu A&B, Covid) Anterior Nasal Swab Negative [AE]  1614 Urinalysis, Routine w reflex microscopic -Urine, Clean Catch(!) Leukocytes trace, bacteria rare [AE]  1704 CBC with Differential(!) Leukocytosis, white blood cells 11.1, anemia, hemoglobin 11.8.  Platelets within normal limits. [AE]  1704 No active pulmonary disease. [AE]  1704 DG Chest 2 View No active pulmonary disease. [AE]  1734 Pro Brain natriuretic peptide Within normal limits [AE]  1734 D-dimer, quantitative(!) 1.96 elevated [AE]  1738 Updated patient on results of labs.  Patient is going to have further doses of Keflex for UTI.  Due to pharmacy hours.  Patient endorses she is a [place] patient and tomorrow she can request more albuterol  inhaler for wheezing.  Patient has at home Zofran  and I advised her to take 1 every 8 hours as needed for nauseous.  Patient is ready for discharge.  Patient is agreeable to the plan. [AE]    Clinical Course User Index [AE] Janit Kast, PA-C    IMPRESSION / MDM / ASSESSMENT AND PLAN / ED COURSE  I reviewed the triage vital signs and the nursing notes.  Differential diagnosis includes, but is not limited to, asthma exacerbation, pneumonia, viral illness, UTI, DVT, unlikely PE  Patient's presentation is most consistent with acute complicated illness / injury requiring diagnostic  workup.   Sandra Fischer is a 74 y.o., female who presents today with history of 5 days of chills, cough, body aches, nauseous, decreased appetite.  Patient is on cancer therapy.  See HPI for further information.  Physical exam patient was tachycardic.  Saturations 95%.  Throat no signs of peritonsillar erythema.  Cardiopulmonary presence of expiratory wheezing in the right base.  Abdomen bowel sounds are positive, tenderness to palpation in suprapubic area, negative McBurney point, negative rebound.  Negative bilateral tenderness to palpation in calf. Plan CBC, CMP, D-dimer, BNP Chest x-ray UA DuoNeb treatment Prednisone  Reassess Reassessed the patient, resolution of expiratory wheezing.  Urinalysis showed UTI.  Patient is allergic to penicillin, I will start nitrofurantoin .  CBC showed leukocytosis.  CMP within normal limits, BNP within normal limits.  Chest x-ray was negative for pneumonia or bronchitis.  I will recheck vital signs before discharge.  Ordered urine culture. Patient's diagnosis is consistent with UTI, viral upper respiratory infection. I independently reviewed and interpreted imaging and agree with radiologists findings. Labs are  reassuring. I did review the patient's allergies and medications.The patient is in stable and satisfactory condition for discharge home  Patient will be discharged home with prescriptions for nitrofurantoin . Patient is to follow up with PCP as needed or otherwise directed. Patient is given ED precautions to return to the ED for any worsening or new symptoms. Discussed plan of care with patient, answered all of patient's questions, patient agreeable to plan of care. Advised patient to take medications according to the instructions on the label. Discussed possible side effects of new medications. Patient verbalized understanding.  FINAL CLINICAL IMPRESSION(S) / ED DIAGNOSES   Final diagnoses:  Influenza-like illness  Acute cystitis without hematuria   Wheezing     Rx / DC Orders   ED Discharge Orders          Ordered    nitrofurantoin , macrocrystal-monohydrate, (MACROBID ) 100 MG capsule  2 times daily        01/08/25 1758    predniSONE  (DELTASONE ) 50 MG tablet  Daily with breakfast  01/08/25 1758             Note:  This document was prepared using Dragon voice recognition software and may include unintentional dictation errors.   Janit Kast, PA-C 01/08/25 1759    Willo Dunnings, MD 01/08/25 1927  "

## 2025-01-08 NOTE — ED Triage Notes (Signed)
 Pt comes with body aches for last few days. Pt stats she thinks she might have flu. Pt states sweats. Pt states dec intake.

## 2025-01-08 NOTE — Discharge Instructions (Addendum)
 You have been diagnosed with influenza-like illness, acute cystitis without hematuria and wheezing.  Please drink plenty of fluids.  Please take nitrofurantoin  1 capsule every 12 hours for 7 days for UTI.  You can take prednisone  1 tablet with breakfast for the next 3 days.  Please continue using your inhaler every 6 hours for wheezing.  Please come back to ED or go to your PCP if you have new symptoms or symptoms worsen.  It was a pleasure to help today.  Marchel Foote PA-C

## 2025-01-09 ENCOUNTER — Telehealth: Payer: Self-pay | Admitting: *Deleted

## 2025-01-09 ENCOUNTER — Other Ambulatory Visit: Payer: Self-pay | Admitting: Oncology

## 2025-01-09 NOTE — Telephone Encounter (Signed)
" ° °  Caller verified using pt's full name and dob prior to discussing PHI     Patient evaluated in ER yesterday for an influenza-like illness and UTI. Patient feeling much better. She has been doing the duoneb treatments, taking the prednisone   and macrobid  as directed.She wanted to make Dr. Babara aware of her ER visit. Pt scheduled for treatment on Thursday this week. She is inquired if she should still keep this appointment as scheduled or if Dr. Babara would post pone her treatment. Please advise.  "

## 2025-01-10 ENCOUNTER — Other Ambulatory Visit: Payer: Self-pay | Admitting: Oncology

## 2025-01-10 NOTE — Telephone Encounter (Signed)
 Dr. Babara agreeable to pt having tx next week. She has been r/s to 1/22. Pt aware of tx date/time and that she is also scheduled for CT at [facility]

## 2025-01-10 NOTE — Telephone Encounter (Signed)
 Called and spoke pt. Notified her that tx appt was r/s to allow recovery from infection. Pt upset stating that she will finish antibiotic course by Wed, why does she have to wait this long. Informed her that chemotherapy will decrease her immune system putting her at increased risk for recurrent infection. Pt states she does not want to wait until 1/27 for tx given as she will finish antibiotic this week.

## 2025-01-12 ENCOUNTER — Inpatient Hospital Stay: Admitting: Oncology

## 2025-01-12 ENCOUNTER — Inpatient Hospital Stay

## 2025-01-12 LAB — URINE CULTURE

## 2025-01-19 ENCOUNTER — Telehealth: Payer: Self-pay | Admitting: Oncology

## 2025-01-19 ENCOUNTER — Inpatient Hospital Stay: Admitting: Oncology

## 2025-01-19 ENCOUNTER — Inpatient Hospital Stay

## 2025-01-19 ENCOUNTER — Ambulatory Visit

## 2025-01-19 ENCOUNTER — Other Ambulatory Visit: Payer: Self-pay | Admitting: Oncology

## 2025-01-19 NOTE — Telephone Encounter (Signed)
 Good morning, the pt came into the office this morning to rs her appts due to her brother passing away. I have her rs to 1/29 at 9 am. can the pt treatment be updated please and thank you.

## 2025-01-19 NOTE — Telephone Encounter (Signed)
 IS updated. Thanks

## 2025-01-20 ENCOUNTER — Other Ambulatory Visit: Payer: Self-pay

## 2025-01-20 ENCOUNTER — Telehealth: Payer: Self-pay | Admitting: *Deleted

## 2025-01-20 MED ORDER — PREDNISONE 50 MG PO TABS: 50.0000 mg | ORAL_TABLET | ORAL | 0 refills | Status: AC

## 2025-01-20 NOTE — Telephone Encounter (Signed)
 Lauren, any room to do CT next Tues or Wed? Before tx on 1/29

## 2025-01-20 NOTE — Telephone Encounter (Signed)
 Patient concerned about the weather and her upcoming Ct scan scheduled on Monday. She wanted to know if the scan could be r/s to after her next treatment or does Dr. Babara need these results prior to the treatment on 1/29. Please advise and call patient with advice.

## 2025-01-20 NOTE — Telephone Encounter (Signed)
 CT scan has been rescheduled to 1/27.  Pt not answering phone and unable to leave VM due to full Mailbox. Called husband's phone and left detailed message with new appt detail and to let her know that dye allergy premed was sent to [facility].

## 2025-01-23 ENCOUNTER — Ambulatory Visit: Payer: Self-pay

## 2025-01-24 ENCOUNTER — Inpatient Hospital Stay

## 2025-01-24 ENCOUNTER — Ambulatory Visit
Admission: RE | Admit: 2025-01-24 | Discharge: 2025-01-24 | Disposition: A | Discharge status: Home / Self Care | Source: Ambulatory Visit | Attending: Obstetrics and Gynecology | Admitting: Obstetrics and Gynecology

## 2025-01-24 ENCOUNTER — Inpatient Hospital Stay: Admitting: Oncology

## 2025-01-24 ENCOUNTER — Telehealth: Payer: Self-pay | Admitting: Obstetrics and Gynecology

## 2025-01-24 DIAGNOSIS — C541 Malignant neoplasm of endometrium: Secondary | ICD-10-CM | POA: Insufficient documentation

## 2025-01-24 DIAGNOSIS — R599 Enlarged lymph nodes, unspecified: Secondary | ICD-10-CM | POA: Diagnosis present

## 2025-01-24 MED ORDER — IOHEXOL 300 MG/ML  SOLN
100.0000 mL | Freq: Once | INTRAMUSCULAR | Status: AC | PRN
  Administered 2025-01-24: 100 mL via INTRAVENOUS

## 2025-01-24 NOTE — Telephone Encounter (Signed)
 Pt called and wanted to know who was the MD here for GYN ONC tomorrow. On the schedule it shows [place] and pt stated NO and to r/s for when [facility] is here. Appt r/s and confirmed with pt for a [place] day

## 2025-01-25 ENCOUNTER — Ambulatory Visit: Payer: Self-pay

## 2025-01-25 ENCOUNTER — Other Ambulatory Visit: Payer: Self-pay

## 2025-01-25 ENCOUNTER — Inpatient Hospital Stay

## 2025-01-26 ENCOUNTER — Inpatient Hospital Stay: Admitting: Oncology

## 2025-01-26 ENCOUNTER — Inpatient Hospital Stay: Attending: Oncology

## 2025-01-26 ENCOUNTER — Encounter: Payer: Self-pay | Admitting: Oncology

## 2025-01-26 ENCOUNTER — Inpatient Hospital Stay

## 2025-01-26 VITALS — BP 149/71 | HR 62 | Temp 98.1°F | Resp 18 | Wt 204.9 lb

## 2025-01-26 DIAGNOSIS — E876 Hypokalemia: Secondary | ICD-10-CM | POA: Diagnosis not present

## 2025-01-26 DIAGNOSIS — M069 Rheumatoid arthritis, unspecified: Secondary | ICD-10-CM | POA: Diagnosis not present

## 2025-01-26 DIAGNOSIS — C541 Malignant neoplasm of endometrium: Secondary | ICD-10-CM

## 2025-01-26 LAB — CBC WITH DIFFERENTIAL (CANCER CENTER ONLY)
Abs Immature Granulocytes: 0.16 10*3/uL — ABNORMAL HIGH (ref 0.00–0.07)
Basophils Absolute: 0 10*3/uL (ref 0.0–0.1)
Basophils Relative: 0 %
Eosinophils Absolute: 0 10*3/uL (ref 0.0–0.5)
Eosinophils Relative: 0 %
HCT: 37 % (ref 36.0–46.0)
Hemoglobin: 11.3 g/dL — ABNORMAL LOW (ref 12.0–15.0)
Immature Granulocytes: 2 %
Lymphocytes Relative: 35 %
Lymphs Abs: 3.1 10*3/uL (ref 0.7–4.0)
MCH: 26.7 pg (ref 26.0–34.0)
MCHC: 30.5 g/dL (ref 30.0–36.0)
MCV: 87.3 fL (ref 80.0–100.0)
Monocytes Absolute: 0.7 10*3/uL (ref 0.1–1.0)
Monocytes Relative: 8 %
Neutro Abs: 5 10*3/uL (ref 1.7–7.7)
Neutrophils Relative %: 55 %
Platelet Count: 354 10*3/uL (ref 150–400)
RBC: 4.24 MIL/uL (ref 3.87–5.11)
RDW: 17.2 % — ABNORMAL HIGH (ref 11.5–15.5)
WBC Count: 9 10*3/uL (ref 4.0–10.5)
nRBC: 0 % (ref 0.0–0.2)

## 2025-01-26 LAB — CMP (CANCER CENTER ONLY)
ALT: 8 U/L (ref 0–44)
AST: 14 U/L — ABNORMAL LOW (ref 15–41)
Albumin: 3.8 g/dL (ref 3.5–5.0)
Alkaline Phosphatase: 63 U/L (ref 38–126)
Anion gap: 15 (ref 5–15)
BUN: 23 mg/dL (ref 8–23)
CO2: 22 mmol/L (ref 22–32)
Calcium: 9.6 mg/dL (ref 8.9–10.3)
Chloride: 105 mmol/L (ref 98–111)
Creatinine: 0.88 mg/dL (ref 0.44–1.00)
GFR, Estimated: >60 mL/min
Glucose, Bld: 145 mg/dL — ABNORMAL HIGH (ref 70–99)
Potassium: 3.3 mmol/L — ABNORMAL LOW (ref 3.5–5.1)
Sodium: 142 mmol/L (ref 135–145)
Total Bilirubin: 0.3 mg/dL (ref 0.0–1.2)
Total Protein: 7.4 g/dL (ref 6.5–8.1)

## 2025-01-26 MED ORDER — POTASSIUM CHLORIDE CRYS ER 20 MEQ PO TBCR: 20.0000 meq | EXTENDED_RELEASE_TABLET | Freq: Every day | ORAL | 0 refills | Status: AC

## 2025-01-26 NOTE — Progress Notes (Signed)
 " Hematology/Oncology Progress note [phone] [phone]    CHIEF COMPLAINTS/REASON FOR VISIT:  Follow up for serous endometrial cancer  ASSESSMENT & PLAN:   Recurrent carcinoma of endometrium (HCC) Recurrent endometrial cancer, TP 53 + Previously s/p  6 cycles of chemotherapy with carboplatin  and Taxol . Patient with developed recurrent disease, retroperitoneal mass-> biopsy proven recurrence. pMMR status, HER2 1+  Labs are reviewed and discussed with patient.  2nd line treatment S/p 3 cycles  carboplatin  AUC 4.5 Taxol  135mg /m2,with GCSF Repeat CT scan showed stable disease, slight decrease of retroperitoneal mass 2.5cm --> 2.1cm Patient has had moderate difficulties while on chemotherapy. Cycle 4 carboplatin  AUC 4 and Taxol  100 mg/m2 wo GCSF, Q4 weeks  cycle 5 dose reduced carboplatin  AUC 4 and Taxol  100 mg/m2.wo GCSF.  Labs are reviewed and discussed with patient.  Interval CT showed enlargement of retroperitoneal mass.  Indicating disease progression. Discussed with gynecology oncology Dr. Elby.  She may be a candidate for Study [id number] BEHOLD-2 trial.  Patient is interested.  She will be evaluated for eligibility  Hypokalemia Recommend potassium 20 mEq daily for 7 days.  Prescription was sent to pharmacy  Rheumatoid arthritis (HCC) Off RA med while on chemotherapy.  follow up with rheumatology     No orders of the defined types were placed in this encounter.  Follow-up 4 weeks All questions were answered. The patient knows to call the clinic with any problems, questions or concerns.  [name], MD, PhD [facility] Hematology Oncology 01/26/2025   HISTORY OF PRESENTING ILLNESS:   Sandra Fischer is a  74 y.o.  female presents for high-grade serous endometrial cancer Oncology History  Recurrent carcinoma of endometrium (HCC)  07/15/2019 Initial Diagnosis   Endometrial cancer  Patient had TH/BSO sentinel lymph node biopsy by [facility] oncology  Dr. Augustin on 06/21/2019. Extensive medical records from [facility] review was performed by me.  Tumor size 9.3 cm, invading 99% of myometrium [3.55 out of 3.6 cm], positive right external iliac sentinel lymph nodes.  Negative washing.  No LVI, serosa is uninvolved. 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 [id number]  FIGO stage IIIC1 MSI intact HER 2 negative.   her case was discussed at [facility] tumor board on 07/04/2019  PORTEC-3 regimen was recommended given subgroup analysis showing benefit in the serous histology  Patient declined treatment.     10/20/2019 Relapse/Recurrence   CT chest abdomen pelvis with contrast showed disease recurrence. There are multiple newly enlarged retroperitoneal and right iliac lymph nodes the largest the left retroperitoneal node measuring 1.9 x 1.6 cm concerning for nodal metastasis disease. Unchanged 4 mm groundglass pulmonary nodule of the left pulmonary apex. This remains nonspecific.    11/07/2019 - 02/20/2020 Chemotherapy   carboplatin  AUC 4.5 and paclitaxel  175 mg/m2 x 2 cycles carboplatin  AUC4.5 and paclitaxel  135 mg/m2 x 4 cycles   03/12/2020 Imaging   CT chest abdomen pelvis with contrast showed 1. The multiple enlarged retroperitoneal and right iliac lymph nodes identified as new on the previous exam from 10/20/2019 have resolved completely in the interval. 2. Tiny ground-glass pulmonary nodule medial left lung apex is stable in the interval. Likely benign, continued attention on follow-up recommended. 3. No new or progressive interval findings. 4.  Aortic Atherosclerois (ICD10-170.0   06/07/2024 Imaging   CT chest abdomen pelvis with contrast showed 1. Newly enlarged left retroperitoneal lymph nodes measuring up to 2.5 x 2.2 cm, consistent with nodal metastatic disease. 2. Occasional tiny pulmonary  nodules unchanged, most likely benign and incidental. 3. Status post hysterectomy. 4.  Nonobstructive left nephrolithiasis.   06/21/2024 Relapse/Recurrence   Periaortic left side soft tissue mass biopsy showed metastatic poorly differentiated carcinoma.    Diagnosis Note : The specimen demonstrates a pleomorphic high-grade epithelioid proliferation. Immunohistochemical stains were performed to characterize the tumor cells. The cells are positive for CK AE1/AE3, GATA3, CDX2, and p40. P53 is mutant type of staining. The cells are negative for PAX8, TTF-1, WT1, ER, -PR, S100 and SOX10. The immunoprofile is not specific and is not indicative of a specific site of primary. The patient's history of endometrial carcinoma is notes. The tumor most likely represents a metastasis from the patient's known endometrial carcinoma since metastatic tumor cells could aberrant loss or gain expression of lineage markers. However, metastasis from other organ system cannot be entirely excluded. Correlation with imaging findings is recommended. Controls worked appropriately.    07/08/2024 -  Chemotherapy   Patient is on Treatment Plan : UTERINE Carboplatin  + Paclitaxel  q21d       INTERVAL HISTORY Sandra Fischer is a 74 y.o. female who has above history reviewed by me today presents for follow up visit for management of endometrial cancer.  She report feeling better since last visit.  Recently patient had influenza-like illness and UTI and was seen at ER. No nausea vomiting diarrhea. No fever or chills.  She uses topical nystatin  cream PRN.   Review of Systems  Constitutional:  Positive for fatigue. Negative for appetite change, chills and fever.  HENT:   Negative for hearing loss and voice change.   Eyes:  Negative for eye problems.  Respiratory:  Negative for chest tightness and cough.   Cardiovascular:  Negative for chest pain.  Gastrointestinal:  Negative for abdominal distention, abdominal pain, blood in stool and diarrhea.  Endocrine: Negative for hot flashes.  Genitourinary:  Negative for  difficulty urinating and frequency.   Musculoskeletal:  Negative for arthralgias.  Skin:  Negative for itching and rash.  Neurological:  Positive for numbness. Negative for extremity weakness.  Hematological:  Negative for adenopathy.  Psychiatric/Behavioral:  Negative for confusion.     MEDICAL HISTORY:  Past Medical History:  Diagnosis Date   Asthma    Asthma    Asthma exacerbation 08/22/2016   Cancer (HCC)    Endometrial   Collagen vascular disease    Diabetes mellitus without complication (HCC)    History of Diabetes    Dyspnea    Environmental allergies    Fibromyalgia    High cholesterol    Hypertension    Hypothyroidism    Iron  deficiency anemia 07/03/2020   Neuropathy    RA (rheumatoid arthritis) (HCC)    Sleep apnea    Thyroid  disease     SURGICAL HISTORY: Past Surgical History:  Procedure Laterality Date   ABDOMINAL HYSTERECTOMY     CATARACT EXTRACTION     CHOLECYSTECTOMY     COLONOSCOPY WITH PROPOFOL  N/A 03/14/2022   Procedure: COLONOSCOPY WITH PROPOFOL ;  Surgeon: Maryruth Ole DASEN, MD;  Location: [facility] ENDOSCOPY;  Service: Endoscopy;  Laterality: N/A;  DM   CYSTOURETHROSCOPY     DIAGNOSTIC LAPAROSCOPY     EYE SURGERY     fibroids removed     PORTA CATH REMOVAL N/A 02/14/2021   Procedure: PORTA CATH REMOVAL;  Surgeon: Marea Selinda RAMAN, MD;  Location: [facility] INVASIVE CV LAB;  Service: Cardiovascular;  Laterality: N/A;   THYROIDECTOMY, PARTIAL     tummy tuck  SOCIAL HISTORY: Social History   Socioeconomic History   Marital status: Married    Spouse name: Cedric    Number of children: Not on file   Years of education: Not on file   Highest education level: Not on file  Occupational History   Occupation: Retired  Tobacco Use   Smoking status: Former    Current packs/day: 0.00    Average packs/day: 0.1 packs/day for 0.2 years    Types: Cigarettes    Start date: 09/1978    Quit date: 1980    Years since quitting: 46.1   Smokeless tobacco: Never   Vaping Use   Vaping status: Never Used  Substance and Sexual Activity   Alcohol use: No   Drug use: No   Sexual activity: Yes    Birth control/protection: Post-menopausal  Other Topics Concern   Not on file  Social History Narrative   Not on file   Social Drivers of Health   Tobacco Use: Medium Risk (01/26/2025)   Patient History    Smoking Tobacco Use: Former    Smokeless Tobacco Use: Never    Passive Exposure: Not on Actuary Strain: Not on file  Food Insecurity: Not on file  Transportation Needs: Not on file  Physical Activity: Not on file  Stress: Not on file  Social Connections: Not on file  Intimate Partner Violence: Not on file  Depression (PHQ2-9): Low Risk (11/16/2024)   Depression (PHQ2-9)    PHQ-2 Score: 0  Alcohol Screen: Not on file  Housing: Unknown (08/04/2024)   Received from [facility]   Epic    Unable to Pay for Housing in the Last Year: Not on file    Number of Times Moved in the Last Year: Not on file    At any time in the past 12 months, were you homeless or living in a shelter (including now)?: No  Utilities: Not on file  Health Literacy: Not on file    FAMILY HISTORY: Family History  Problem Relation Age of Onset   Diabetes Mother    Hypertension Mother    Hyperlipidemia Mother    Dementia Mother    Breast cancer Neg Hx    Ovarian cancer Neg Hx    Colon cancer Neg Hx     ALLERGIES:  is allergic to abatacept, codeine, iodine, latex, shellfish allergy, trimethoprim  hydrochloride [trimethoprim ], metformin, and penicillins.  MEDICATIONS:  Current Outpatient Medications  Medication Sig Dispense Refill   Acetaminophen  (TYLENOL  EXTRA STRENGTH PO) Take 1,300 mg by mouth 2 (two) times daily as needed.     ascorbic acid (VITAMIN C) 500 MG tablet Take 500 mg by mouth daily.     aspirin  EC 81 MG tablet Take 81 mg by mouth daily.     azelastine  (ASTELIN ) 0.1 % nasal spray Place 2 sprays into both nostrils 2 (two)  times daily. 30 mL 5   Calcium Carbonate-Vit D-Min (CALCIUM 600+D PLUS MINERALS) 600-400 MG-UNIT TABS Take 1 tablet by mouth 2 (two) times daily.     cetirizine  (ZYRTEC ) 10 MG tablet Take 1 tablet (10 mg total) by mouth daily. 30 tablet 0   chlorpheniramine-HYDROcodone  (TUSSIONEX) 10-8 MG/5ML Take 5 mLs by mouth every 12 (twelve) hours as needed for cough. 120 mL 0   Cholecalciferol (D3 ADULT PO) Take 1 capsule by mouth daily.     clotrimazole-betamethasone (LOTRISONE) cream Apply topically 4 (four) times daily as needed.     desloratadine (CLARINEX) 5 MG tablet Take 5  mg by mouth daily.     dexamethasone  (DECADRON ) 4 MG tablet Take 2 tablets (8 mg total) by mouth daily for 3 days. Start the day after chemotherapy. Take with food. 30 tablet 1   empagliflozin (JARDIANCE) 25 MG TABS tablet Take 12.5 mg by mouth daily.     fluticasone  (FLONASE ) 50 MCG/ACT nasal spray Place 1 spray into both nostrils 2 (two) times daily. 16 g 0   fluticasone -salmeterol (ADVAIR) 250-50 MCG/ACT AEPB Inhale 1 puff into the lungs in the morning and at bedtime.     furosemide (LASIX) 10 MG/ML solution Take by mouth daily.     gabapentin  (NEURONTIN ) 300 MG capsule Take 1 capsule (300 mg total) by mouth 2 (two) times daily. 60 capsule 0   hydrALAZINE (APRESOLINE) 25 MG tablet Take 25 mg by mouth 2 (two) times daily.     ipratropium (ATROVENT ) 0.02 % nebulizer solution Take 0.5 mg by nebulization every 4 (four) hours as needed.      levothyroxine (SYNTHROID) 75 MCG tablet Take 75 mcg by mouth daily before breakfast.     lisinopril (PRINIVIL,ZESTRIL) 40 MG tablet Take 20 mg by mouth in the morning and at bedtime.      metoprolol succinate (TOPROL-XL) 50 MG 24 hr tablet Take 50 mg by mouth daily. Take with or immediately following a meal.     montelukast  (SINGULAIR ) 10 MG tablet Take 10 mg by mouth at bedtime.     nortriptyline (PAMELOR) 25 MG capsule Take 25 mg by mouth at bedtime.     nystatin  cream (MYCOSTATIN ) Apply 1  Application topically 2 (two) times daily. 30 g 3   olopatadine  (PATANOL) 0.1 % ophthalmic solution Place 1 drop into both eyes 2 (two) times daily. 5 mL 5   omeprazole (PRILOSEC) 10 MG capsule Take 20 mg by mouth daily.     ondansetron  (ZOFRAN ) 8 MG tablet Take 1 tablet (8 mg total) by mouth every 8 (eight) hours as needed for nausea or vomiting. Start on the third day after chemotherapy. 30 tablet 1   ondansetron  (ZOFRAN -ODT) 4 MG disintegrating tablet Take 1 tablet (4 mg total) by mouth every 8 (eight) hours as needed for nausea or vomiting. 15 tablet 0   potassium chloride  SA (KLOR-CON  M) 20 MEQ tablet Take 1 tablet (20 mEq total) by mouth daily. 7 tablet 0   pravastatin (PRAVACHOL) 40 MG tablet Take 40 mg by mouth daily.     predniSONE  (DELTASONE ) 5 MG tablet Take 5 mg by mouth daily with breakfast.     Prenatal Vit-Fe Fumarate-FA (MULTIVITAMIN-PRENATAL) 27-0.8 MG TABS tablet Take 1 tablet by mouth daily.      prochlorperazine  (COMPAZINE ) 10 MG tablet Take 1 tablet (10 mg total) by mouth every 6 (six) hours as needed for nausea or vomiting. 30 tablet 1   senna-docusate (SENOKOT-S) 8.6-50 MG tablet Take 1 tablet by mouth at bedtime as needed for mild constipation.     sulfaSALAzine (AZULFIDINE) 500 MG tablet TAKE ONE TABLET BY MOUTH TWO TIMES A DAY FOR 14 DAYS, THEN TAKE TWO TABLETS TWO TIMES A DAY FOR 14 DAYS, THEN TAKE THREE TABLETS TWO TIMES A DAY FOR 62 DAYS FOR INFLAMMATION. TAKE AFTER MEALS     tacrolimus (PROTOPIC) 0.1 % ointment Apply 1 Application topically 2 (two) times daily.     tiotropium (SPIRIVA) 18 MCG inhalation capsule Place 18 mcg into inhaler and inhale daily.     verapamil (CALAN) 120 MG tablet Take 240 mg by mouth 2 (two)  times daily.     leflunomide (ARAVA) 20 MG tablet Take 20 mg by mouth daily. (Patient not taking: Reported on 01/26/2025)     predniSONE  (DELTASONE ) 50 MG tablet Take 1 tablet (50 mg total) by mouth See admin instructions. Prednisone  - take 50 mg by mouth at  13 hours, 7 hours, and 1 hour before contrast media injection (Patient not taking: Reported on 01/26/2025) 3 tablet 0   No current facility-administered medications for this visit.     PHYSICAL EXAMINATION: ECOG PERFORMANCE STATUS: 1 - Symptomatic but completely ambulatory  Physical Exam Constitutional:      General: She is not in acute distress.    Appearance: She is obese.  HENT:     Head: Normocephalic and atraumatic.  Eyes:     General: No scleral icterus. Cardiovascular:     Rate and Rhythm: Normal rate and regular rhythm.     Heart sounds: Normal heart sounds.  Pulmonary:     Effort: Pulmonary effort is normal. No respiratory distress.     Breath sounds: Normal breath sounds. No wheezing.  Abdominal:     General: Bowel sounds are normal. There is no distension.     Palpations: Abdomen is soft.  Musculoskeletal:        General: Deformity present. Normal range of motion.     Cervical back: Normal range of motion and neck supple.  Skin:    General: Skin is warm and dry.     Findings: No erythema or rash.  Neurological:     Mental Status: She is alert and oriented to person, place, and time. Mental status is at baseline.  Psychiatric:     Comments: Feel frustrated.      LABORATORY DATA:  I have reviewed the data as listed    Latest Ref Rng & Units 01/26/2025    8:52 AM 01/08/2025    4:42 PM 12/15/2024    8:59 AM  CBC  WBC 4.0 - 10.5 K/uL 9.0  11.1  8.5   Hemoglobin 12.0 - 15.0 g/dL 88.6  88.1  87.7   Hematocrit 36.0 - 46.0 % 37.0  38.6  39.8   Platelets 150 - 400 K/uL 354  211  245       Latest Ref Rng & Units 01/26/2025    8:52 AM 01/08/2025    4:42 PM 12/15/2024    8:58 AM  CMP  Glucose 70 - 99 mg/dL 854  886  883   BUN 8 - 23 mg/dL 23  11  19    Creatinine 0.44 - 1.00 mg/dL 9.11  9.21  9.13   Sodium 135 - 145 mmol/L 142  137  144   Potassium 3.5 - 5.1 mmol/L 3.3  3.6  3.6   Chloride 98 - 111 mmol/L 105  98  105   CO2 22 - 32 mmol/L 22  25  26    Calcium  8.9 - 10.3 mg/dL 9.6  89.7  9.7   Total Protein 6.5 - 8.1 g/dL 7.4  7.7  7.6   Total Bilirubin 0.0 - 1.2 mg/dL 0.3  0.5  0.4   Alkaline Phos 38 - 126 U/L 63  75  76   AST 15 - 41 U/L 14  16  13    ALT 0 - 44 U/L 8  9  10       Iron /TIBC/Ferritin/ %Sat    Component Value Date/Time   IRON  59 10/06/2024 0827   TIBC 279 10/06/2024 0827   FERRITIN 235 10/06/2024  0827   IRONPCTSAT 21 10/06/2024 0827      RADIOGRAPHIC STUDIES: I have personally reviewed the radiological images as listed and agreed with the findings in the report., CT CHEST ABDOMEN PELVIS W CONTRAST Result Date: 01/25/2025 CLINICAL DATA:  Endometrial cancer, staging.  * Tracking Code: BO * EXAM: CT CHEST, ABDOMEN, AND PELVIS WITH CONTRAST TECHNIQUE: Multidetector CT imaging of the chest, abdomen and pelvis was performed following the standard protocol during bolus administration of intravenous contrast. RADIATION DOSE REDUCTION: This exam was performed according to the departmental dose-optimization program which includes automated exposure control, adjustment of the mA and/or kV according to patient size and/or use of iterative reconstruction technique. CONTRAST:  OMNIPAQUE  IOHEXOL  300 MG/ML  SOLN COMPARISON:  10/12/2024. FINDINGS: CT CHEST FINDINGS Cardiovascular: Atherosclerotic calcification of the aorta, aortic valve and coronary arteries. Heart is mildly enlarged. No pericardial effusion. Mediastinum/Nodes: No pathologically enlarged mediastinal, hilar or axillary lymph nodes. Esophagus is grossly unremarkable. Lungs/Pleura: Minimal scarring in the lateral left lower lobe. Minimal dependent atelectasis bilaterally. Lungs are otherwise clear. No suspicious pulmonary nodules. No pleural fluid. Airway is unremarkable. Musculoskeletal: Minimal degenerative change in the spine. No worrisome lytic or sclerotic lesions. CT ABDOMEN PELVIS FINDINGS Hepatobiliary: Liver is unremarkable. Cholecystectomy. No biliary ductal dilatation.  Pancreas: Negative. Spleen: Negative. Adrenals/Urinary Tract: Subcentimeter low-attenuation lesions in the kidneys, too small to characterize. No specific follow-up necessary. Small left renal stone. Ureters are decompressed. Bladder is grossly unremarkable. Stomach/Bowel: Stomach, small bowel, appendix and colon are unremarkable. Vascular/Lymphatic: Atherosclerotic calcification of the aorta. Abdominal retroperitoneal adenopathy has enlarged in the interval with index lesion measuring 2.7 x 3.3 cm in the left periaortic station (2/61), previously 2.1 x 2.6 cm. No new adenopathy. Reproductive: Minimal degenerative change in the spine. No worrisome lytic or sclerotic lesions. Other: No free fluid.  Mesenteries and peritoneum are unremarkable. Musculoskeletal: Minimal degenerative change in the spine. No worrisome lytic or sclerotic lesions. IMPRESSION: 1. Enlarging abdominal retroperitoneal adenopathy, compatible with progressive metastatic disease. 2. Small left renal stone. 3. Aortic atherosclerosis (ICD10-I70.0). Coronary artery calcification. Electronically Signed   By: Newell Eke M.D.   On: 01/25/2025 13:04   DG Chest 2 View Result Date: 01/08/2025 CLINICAL DATA:  Expiratory wheezing right lung base EXAM: CHEST - 2 VIEW COMPARISON:  11/10/2024 FINDINGS: The heart size and mediastinal contours are within normal limits. Both lungs are clear. The visualized skeletal structures are unremarkable. IMPRESSION: No active cardiopulmonary disease. Electronically Signed   By: Ozell Daring M.D.   On: 01/08/2025 16:16   MM 3D SCREENING MAMMOGRAM BILATERAL BREAST Result Date: 12/14/2024 CLINICAL DATA:  Screening. EXAM: DIGITAL SCREENING BILATERAL MAMMOGRAM WITH TOMOSYNTHESIS AND CAD TECHNIQUE: Bilateral screening digital craniocaudal and mediolateral oblique mammograms were obtained. Bilateral screening digital breast tomosynthesis was performed. The images were evaluated with computer-aided detection.  COMPARISON:  Previous exam(s). ACR Breast Density Category c: The breasts are heterogeneously dense, which may obscure small masses. FINDINGS: There are no findings suspicious for malignancy. IMPRESSION: No mammographic evidence of malignancy. A result letter of this screening mammogram will be mailed directly to the patient. RECOMMENDATION: Screening mammogram in one year. (Code:[id number]-B-01Y) BI-RADS CATEGORY  1: Negative. Electronically Signed   By: Alm Parkins M.D.   On: 12/14/2024 14:46   DG Chest 2 View Result Date: 11/10/2024 CLINICAL DATA:  Chest pain EXAM: CHEST - 2 VIEW COMPARISON:  Chest x-ray 10/09/2024 FINDINGS: The heart size and mediastinal contours are within normal limits. Both lungs are clear. The visualized skeletal structures are unremarkable.  IMPRESSION: No active cardiopulmonary disease. Electronically Signed   By: Greig Pique M.D.   On: 11/10/2024 15:47   MM Outside Films Mammo Result Date: 11/04/2024 This examination belongs to an outside facility and is stored here for comparison purposes only.  Contact the originating outside institution for any associated report or interpretation.  MM Outside Films Mammo Result Date: 11/04/2024 This examination belongs to an outside facility and is stored here for comparison purposes only.  Contact the originating outside institution for any associated report or interpretation.  MM Outside Films Mammo Result Date: 11/04/2024 This examination belongs to an outside facility and is stored here for comparison purposes only.  Contact the originating outside institution for any associated report or interpretation.  MM Outside Films Mammo Result Date: 11/04/2024 This examination belongs to an outside facility and is stored here for comparison purposes only.  Contact the originating outside institution for any associated report or interpretation.    "

## 2025-01-26 NOTE — Assessment & Plan Note (Signed)
 Off RA med while on chemotherapy.   follow up with rheumatology

## 2025-01-26 NOTE — Assessment & Plan Note (Signed)
 Recommend potassium 20 mEq daily for 7 days.  Prescription was sent to pharmacy

## 2025-01-26 NOTE — Assessment & Plan Note (Addendum)
 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 Patient with developed recurrent disease, retroperitoneal mass-> biopsy proven recurrence. pMMR status, HER2 1+  Labs are reviewed and discussed with patient.  2nd line treatment S/p 3 cycles  carboplatin  AUC 4.5 Taxol  135mg /m2,with GCSF Repeat CT scan showed stable disease, slight decrease of retroperitoneal mass 2.5cm --> 2.1cm Patient has had moderate difficulties while on chemotherapy. Cycle 4 carboplatin  AUC 4 and Taxol  100 mg/m2 wo GCSF, Q4 weeks  cycle 5 dose reduced carboplatin  AUC 4 and Taxol  100 mg/m2.wo GCSF.  Labs are reviewed and discussed with patient.  Interval CT showed enlargement of retroperitoneal mass.  Indicating disease progression. Discussed with gynecology oncology Dr. Elby.  She may be a candidate for Study [id number] BEHOLD-2 trial.  Patient is interested.  She will be evaluated for eligibility

## 2025-01-27 LAB — CA 125: Cancer Antigen (CA) 125: 14.7 U/mL (ref 0.0–38.1)

## 2025-02-07 ENCOUNTER — Ambulatory Visit: Admitting: Podiatry

## 2025-03-01 ENCOUNTER — Inpatient Hospital Stay
